# Patient Record
Sex: Male | Born: 1935 | Race: White | Hispanic: No | Marital: Married
Health system: Southern US, Community
[De-identification: ages and names within clinical notes are randomized; demographics above are authoritative.]

## PROBLEM LIST (undated history)

## (undated) DIAGNOSIS — H9190 Unspecified hearing loss, unspecified ear: Secondary | ICD-10-CM

## (undated) DIAGNOSIS — Z86718 Personal history of other venous thrombosis and embolism: Secondary | ICD-10-CM

## (undated) DIAGNOSIS — E7800 Pure hypercholesterolemia, unspecified: Secondary | ICD-10-CM

## (undated) DIAGNOSIS — I639 Cerebral infarction, unspecified: Secondary | ICD-10-CM

## (undated) DIAGNOSIS — I7121 Aneurysm of the ascending aorta, without rupture: Secondary | ICD-10-CM

## (undated) DIAGNOSIS — N183 Chronic kidney disease, stage 3 unspecified: Secondary | ICD-10-CM

## (undated) DIAGNOSIS — I712 Thoracic aortic aneurysm, without rupture: Secondary | ICD-10-CM

## (undated) DIAGNOSIS — F329 Major depressive disorder, single episode, unspecified: Secondary | ICD-10-CM

## (undated) DIAGNOSIS — I350 Nonrheumatic aortic (valve) stenosis: Secondary | ICD-10-CM

## (undated) DIAGNOSIS — N400 Enlarged prostate without lower urinary tract symptoms: Secondary | ICD-10-CM

## (undated) DIAGNOSIS — I82409 Acute embolism and thrombosis of unspecified deep veins of unspecified lower extremity: Secondary | ICD-10-CM

## (undated) DIAGNOSIS — I714 Abdominal aortic aneurysm, without rupture, unspecified: Secondary | ICD-10-CM

## (undated) DIAGNOSIS — R319 Hematuria, unspecified: Secondary | ICD-10-CM

## (undated) DIAGNOSIS — G459 Transient cerebral ischemic attack, unspecified: Secondary | ICD-10-CM

## (undated) DIAGNOSIS — F32A Depression, unspecified: Secondary | ICD-10-CM

## (undated) DIAGNOSIS — I4891 Unspecified atrial fibrillation: Secondary | ICD-10-CM

## (undated) DIAGNOSIS — I4821 Permanent atrial fibrillation: Secondary | ICD-10-CM

## (undated) DIAGNOSIS — G4733 Obstructive sleep apnea (adult) (pediatric): Secondary | ICD-10-CM

## (undated) DIAGNOSIS — R918 Other nonspecific abnormal finding of lung field: Secondary | ICD-10-CM

## (undated) DIAGNOSIS — E785 Hyperlipidemia, unspecified: Secondary | ICD-10-CM

## (undated) HISTORY — DX: Cerebral infarction, unspecified: I63.9

## (undated) HISTORY — DX: Major depressive disorder, single episode, unspecified: F32.9

## (undated) HISTORY — DX: Obstructive sleep apnea (adult) (pediatric): G47.33

## (undated) HISTORY — PX: PROSTATE SURGERY: SHX751

## (undated) HISTORY — DX: Nonrheumatic aortic (valve) stenosis: I35.0

## (undated) HISTORY — DX: Abdominal aortic aneurysm, without rupture, unspecified: I71.40

## (undated) HISTORY — DX: Hyperlipidemia, unspecified: E78.5

## (undated) HISTORY — DX: Abdominal aortic aneurysm, without rupture: I71.4

## (undated) HISTORY — DX: Transient cerebral ischemic attack, unspecified: G45.9

## (undated) HISTORY — DX: Pure hypercholesterolemia, unspecified: E78.00

## (undated) HISTORY — DX: Depression, unspecified: F32.A

---
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
## 1999-09-23 ENCOUNTER — Encounter: Payer: Self-pay | Admitting: Urology

## 1999-09-23 ENCOUNTER — Encounter: Admission: RE | Admit: 1999-09-23 | Discharge: 1999-09-23 | Payer: Self-pay | Admitting: Urology

## 2001-03-21 ENCOUNTER — Ambulatory Visit (HOSPITAL_COMMUNITY): Admission: RE | Admit: 2001-03-21 | Discharge: 2001-03-21 | Payer: Self-pay | Admitting: Endocrinology

## 2001-03-21 ENCOUNTER — Encounter: Payer: Self-pay | Admitting: Endocrinology

## 2002-06-30 ENCOUNTER — Emergency Department (HOSPITAL_COMMUNITY): Admission: EM | Admit: 2002-06-30 | Discharge: 2002-06-30 | Payer: Self-pay | Admitting: Emergency Medicine

## 2002-07-05 ENCOUNTER — Emergency Department (HOSPITAL_COMMUNITY): Admission: EM | Admit: 2002-07-05 | Discharge: 2002-07-05 | Payer: Self-pay | Admitting: Emergency Medicine

## 2002-07-05 ENCOUNTER — Encounter: Payer: Self-pay | Admitting: Emergency Medicine

## 2002-08-24 HISTORY — PX: NM MYOCAR PERF WALL MOTION: HXRAD629

## 2002-10-08 ENCOUNTER — Encounter: Payer: Self-pay | Admitting: Urology

## 2002-10-08 ENCOUNTER — Inpatient Hospital Stay (HOSPITAL_COMMUNITY): Admission: RE | Admit: 2002-10-08 | Discharge: 2002-10-11 | Payer: Self-pay | Admitting: Urology

## 2002-10-09 ENCOUNTER — Encounter (INDEPENDENT_AMBULATORY_CARE_PROVIDER_SITE_OTHER): Payer: Self-pay | Admitting: Specialist

## 2003-01-27 ENCOUNTER — Encounter: Payer: Self-pay | Admitting: *Deleted

## 2003-01-27 ENCOUNTER — Emergency Department (HOSPITAL_COMMUNITY): Admission: EM | Admit: 2003-01-27 | Discharge: 2003-01-27 | Payer: Self-pay | Admitting: Emergency Medicine

## 2003-01-29 ENCOUNTER — Emergency Department (HOSPITAL_COMMUNITY): Admission: EM | Admit: 2003-01-29 | Discharge: 2003-01-29 | Payer: Self-pay | Admitting: *Deleted

## 2003-02-06 ENCOUNTER — Emergency Department (HOSPITAL_COMMUNITY): Admission: EM | Admit: 2003-02-06 | Discharge: 2003-02-06 | Payer: Self-pay | Admitting: Emergency Medicine

## 2004-10-05 DIAGNOSIS — I82409 Acute embolism and thrombosis of unspecified deep veins of unspecified lower extremity: Secondary | ICD-10-CM

## 2004-10-05 HISTORY — DX: Acute embolism and thrombosis of unspecified deep veins of unspecified lower extremity: I82.409

## 2004-12-15 ENCOUNTER — Encounter (INDEPENDENT_AMBULATORY_CARE_PROVIDER_SITE_OTHER): Payer: Self-pay | Admitting: *Deleted

## 2004-12-15 ENCOUNTER — Inpatient Hospital Stay (HOSPITAL_COMMUNITY): Admission: RE | Admit: 2004-12-15 | Discharge: 2004-12-24 | Payer: Self-pay | Admitting: Urology

## 2004-12-17 ENCOUNTER — Encounter (INDEPENDENT_AMBULATORY_CARE_PROVIDER_SITE_OTHER): Payer: Self-pay | Admitting: Cardiology

## 2004-12-25 ENCOUNTER — Inpatient Hospital Stay (HOSPITAL_COMMUNITY): Admission: EM | Admit: 2004-12-25 | Discharge: 2004-12-27 | Payer: Self-pay | Admitting: Emergency Medicine

## 2005-10-17 ENCOUNTER — Encounter: Admission: RE | Admit: 2005-10-17 | Discharge: 2005-10-17 | Payer: Self-pay | Admitting: Neurological Surgery

## 2005-12-09 ENCOUNTER — Ambulatory Visit (HOSPITAL_COMMUNITY): Admission: RE | Admit: 2005-12-09 | Discharge: 2005-12-09 | Payer: Self-pay | Admitting: *Deleted

## 2005-12-09 ENCOUNTER — Encounter (INDEPENDENT_AMBULATORY_CARE_PROVIDER_SITE_OTHER): Payer: Self-pay | Admitting: Specialist

## 2007-09-13 ENCOUNTER — Encounter: Admission: RE | Admit: 2007-09-13 | Discharge: 2007-09-13 | Payer: Self-pay | Admitting: Cardiology

## 2008-02-08 ENCOUNTER — Encounter (INDEPENDENT_AMBULATORY_CARE_PROVIDER_SITE_OTHER): Payer: Self-pay | Admitting: *Deleted

## 2008-02-08 ENCOUNTER — Ambulatory Visit (HOSPITAL_COMMUNITY): Admission: RE | Admit: 2008-02-08 | Discharge: 2008-02-08 | Payer: Self-pay | Admitting: *Deleted

## 2011-02-17 NOTE — Op Note (Signed)
[name], Cesar [name]               ACCOUNT NO.:  [id number]   MEDICAL RECORD NO.:  [id number]          PATIENT TYPE:  AMB   LOCATION:  ENDO                         FACILITY:  [facility]   PHYSICIAN:  Georgiana Spinner, M.D.    DATE OF BIRTH:  [birth year]   DATE OF PROCEDURE:  DATE OF DISCHARGE:                               OPERATIVE REPORT   PROCEDURE:  Colonoscopy with polypectomy and biopsy.   INDICATIONS:  Colon polyps.   ANESTHESIA:  Fentanyl 50 mcg, Versed 5 mg.   DESCRIPTION OF PROCEDURE:  With the patient mildly sedated in the left  lateral decubitus position a rectal examination was performed, and I  felt there might be a prostate nodule on the right-hand side and the  prostate felt enlarged.  Subsequently, the Pentax videoscopic  colonoscope was inserted in the rectum and passed under direct vision  with pressure applied.  We reached the cecum, identified by the  ileocecal valve and appendiceal orifice.  There appeared to be a small  amount of bleeding in the cecum, possibly due to scope trauma from  suctioning only, which indicated to me that he may have been over  anticoagulated.  He had been on Lovenox in lieu of Coumadin.  In the  cecum was a polyp that I photographed and removed using snare cautery  technique, setting of 20/150 blended current.  A small amount of blood  oozed from this site despite a good coagulation.  Therefore, I elected  to inject 2 mL of epinephrine into this area and the bleeding stopped.  There were two other polyps in this area.  One was removed using hot  biopsy forceps technique, the second using snare cautery technique,  again with the same setting of 20/150 blended current.  There was good  hemostasis from these sites.  From this point, the colonoscope was  slowly withdrawn taking circumferential views of the colonic mucosa,  stopping next at 25 cm from the anal verge at which point another polyp  was seen, photographed and removed, again using  snare cautery technique  with the same setting.  And once again, there was a mild degree of  oozing from the biopsy site.  Two mL of epinephrine were injected and we  waited, but there was still some oozing, so I injected 1 mL on the other  side of the polypectomy site with good cessation of bleeding at that  point.  The endoscope was withdrawn to the rectum which appeared normal,  although the prostate could be seen indenting the rectum and showed  hemorrhoids on retroflexed view.  The endoscope was straightened and  withdrawn.  The patient's vital signs and pulse oximeter remained  stable.  The patient tolerated procedure well without apparent  complications.   FINDINGS:  Internal hemorrhoids, enlarged prostate with question of  nodule.  Polyps at 25 cm from the anal verge and in the cecum.   PLAN:  Await biopsy reports.  The patient will call me for results and  follow-up with me as an outpatient.  ______________________________  Georgiana Spinner, M.D.     GMO/MEDQ  D:  02/08/2008  T:  02/08/2008  Job:  [id number]

## 2011-02-20 NOTE — H&P (Signed)
NAMEBANYAN, GOODCHILD               ACCOUNT NO.:  [id number]   MEDICAL RECORD NO.:  [id number]          PATIENT TYPE:  INP   LOCATION:  0343                         FACILITY:  [facility]   PHYSICIAN:  Mallory Shirk, MD     DATE OF BIRTH:  Dec 27, 1928   DATE OF ADMISSION:  12/15/2004  DATE OF DISCHARGE:                                HISTORY & PHYSICAL   Audio too short to transcribe (less than 5 seconds)      GDK/MEDQ  D:  12/16/2004  T:  12/16/2004  Job:  [id number]

## 2011-02-20 NOTE — Op Note (Signed)
NAMECHRISTIANJAMES, SOULE               ACCOUNT NO.:  [id number]   MEDICAL RECORD NO.:  [id number]          PATIENT TYPE:  AMB   LOCATION:  ENDO                         FACILITY:  [facility]   PHYSICIAN:  Georgiana Spinner, M.D.    DATE OF BIRTH:  01-01-1929   DATE OF PROCEDURE:  12/09/2005  DATE OF DISCHARGE:                                 OPERATIVE REPORT   PROCEDURE:  Upper endoscopy.   ENDOSCOPIST:  Georgiana Spinner, M.D.   INDICATIONS:  Hemoccult positivity.   ANESTHESIA:  Demerol 50 and Versed 5 mg.   DESCRIPTION OF OPERATION:  With the patient mild sedated in the left lateral  decubitus position the Olympus video endoscope was inserted into mouth,  passed under direct vision, and through this the esophagus appeared normal  into the stomach. The fundus, body, antrum, duodenal bulb, and the second  portion of the duodenum were visualized.  From  this point the endoscope was  slowly withdrawn taking circumferential views of the duodenal mucosa, until  the endoscope had an pulled back into the stomach, placed in retroflexion to  view the stomach from below.   The endoscope was straightened and withdrawn, while taking circumferential  views of the remaining gastric and esophageal mucosae.  The patient's vital  signs plus _________ stable condition.   The patient tolerated the procedure well with no apparent complications.   FINDINGS:  This was an unremarkable examination.   PLAN:  Proceed to colonoscopy.           ______________________________  Georgiana Spinner, M.D.     GMO/MEDQ  D:  12/09/2005  T:  12/10/2005  Job:  [id number]

## 2011-02-20 NOTE — H&P (Signed)
NAMEKENDAL, RAFFO               ACCOUNT NO.:  [id number]   MEDICAL RECORD NO.:  [id number]          PATIENT TYPE:  INP   LOCATION:  0343                         FACILITY:  [facility]   PHYSICIAN:  Bertram Millard. Dahlstedt, M.D.DATE OF BIRTH:  1928-11-13   DATE OF ADMISSION:  12/15/2004  DATE OF DISCHARGE:  12/24/2004                                HISTORY & PHYSICAL   CHIEF COMPLAINT:  Benign prostatic hypertrophy.   HISTORY OF PRESENT ILLNESS:  Mr. Steinhoff is a nice 77 year old male who was  admitted for management of BPH. The patient had a TURP about 2 years ago. He  has had intermittent gross hematuria since that time. He is maintained on  Coumadin. A recent look in the patient's bladder for his persistent  hematuria revealed a fair number of stones lining the prostatic urethra,  also nodular re growth of this tissue.   As the patient has had persistent hematuria, in light of normal upper  tracts, it was recommended that he undergo repeat TURP to get rid of these  stones which are of fairly decent size. He presents at this time for that  procedure.   PAST MEDICAL HISTORY:  1.  Significant for benign prostatic hypertrophy. He is status post TURP in      January, 2000.  2.  History of atrial fibrillation which is rate controlled.  3.  History of rheumatic fever as a child, and left atrial enlargement.   MEDICATIONS ON ADMISSION:  1.  Digoxin 2.5 mg daily.  2.  Simvastatin 40 mg daily.  3.  He stopped his Coumadin recently.   ALLERGIES:  FLOMAX - causes upset stomach.   FAMILY HISTORY:  Significant for CVA.   SOCIAL HISTORY:  The patient is married and lives with his wife. He does not  use tobacco. He rarely drinks. He is retired.   PHYSICAL EXAMINATION:  GENERAL:  Exam revealed a pleasant, healthy appearing  elderly male.  VITAL SIGNS: Normal.  HEART:  Irregular rate, normal rhythm.  LUNGS:  Clear.  ABDOMEN:  Unremarkable.  GU:  Phallus normal without lesions, no fibrotic  areas or plaques. Glans  normal, meatus normal location and size without blood or discharge. Scrotal  skin normal. Testicles normal.  RECTAL:  Normal anal sphincter tone. Gland 3+, symmetrical, non nodular and  nontender. No rectal masses.  Seminal vesicles non palpable.   ADMISSION DATA:  Hematocrit 43.5%, white count 5900, platelet count 187,000.  PT and PTT were normal.  B-MET was normal except for a glucose of 110. EKG revealed atrial  fibrillation, rate 81, nonspecific ST, T wave abnormality, probable  Digitalis effect.   IMPRESSION:  1.  Hematuria.  2.  Benign prostatic hypertrophy.  3.  Prostatic calculi.   PLAN:  Admitted for transurethral resection of the prostate.      SMD/MEDQ  D:  01/18/2005  T:  01/18/2005  Job:  [id number]

## 2011-02-20 NOTE — Discharge Summary (Signed)
NAME:  Cesar [name], Cesar [name]                         ACCOUNT NO.:  [id number]   MEDICAL RECORD NO.:  [id number]                   PATIENT TYPE:  INP   LOCATION:  0374                                 FACILITY[facility]  [facility]   PHYSICIAN:  Bertram Millard. Dahlstedt, M.D.          DATE OF BIRTH:  09-13-1929   DATE OF ADMISSION:  DATE OF DISCHARGE:  10/11/2002                                 DISCHARGE SUMMARY   PRINCIPAL DIAGNOSIS:  Benign prostatic hyperplasia with retention.   SECONDARY DIAGNOSIS:  Atrial fibrillation, chronic.   PROCEDURE:  Transurethral resection of prostate on October 09, 2002.   HISTORY OF PRESENT ILLNESS:  The patient is a 77 year old male with chronic  atrial fibrillation, on Coumadin. He has been treated on several occasions  for urinary retention in my office. He has been on maximum medical therapy.  Due to the patient's recurrent retention and complications related to this,  I have recommended that we proceed with definitive surgical procedure. He is  admitted at this time for transurethral resection of prostate. The patient  has been weaning off his Coumadin and was to be admitted the day before for  heparin therapy.   HOSPITAL COURSE:  The patient was admitted to the hospital on October 08, 2002, and placed on IV heparin. This was  discontinued early on the morning  of his surgery which was October 09, 2002. He underwent transurethral  resection of prostate. He tolerated this procedure well.   His postoperative course was unremarkable except for a small amount of clot  retention the night after surgery. I came in the morning after surgery and  easily irrigated a small clot, which then alleviated this retention. The  overhead irrigation was left on for 24 hours. He was then hooked to  dependent drainage, and then on postoperative morning #2 he had his catheter  removed.   He voided well and his urine was fairly clear. At that time he was felt  suitable for discharge.  Pathologic review of his specimen revealed benign  prostatic tissue with foci of inflammation.   DISCHARGE MEDICATIONS:  The patient was discharged home on his regular  medications, to start Coumadin the night of discharge. He was  also sent  home on Keflex 250 mg 1 p.o. q.12h., #15.    FOLLOW UP:  He will follow up with me in approximately 2 weeks.   DISCHARGE INSTRUCTIONS:  He was given discharge instructions. He was  discharged on a regular diet.                                                Bertram Millard. Retta Diones, M.D.    SMD/MEDQ  D:  10/11/2002  T:  10/11/2002  Job:  [id number]   cc:   Brooke Bonito, M.D.  [address]  [place]  [place] [address]  Fax: [phone]   Nicki Guadalajara, M.D.  [id number]  [place], [place] [address]  Fax: [phone]

## 2011-02-20 NOTE — Op Note (Signed)
[name], Cesar [name]               ACCOUNT NO.:  [id number]   MEDICAL RECORD NO.:  [id number]          PATIENT TYPE:  AMB   LOCATION:  ENDO                         FACILITY:  [facility]   PHYSICIAN:  Georgiana Spinner, M.D.    DATE OF BIRTH:  [birth year]   DATE OF PROCEDURE:  12/09/2005  DATE OF DISCHARGE:                                 OPERATIVE REPORT   PROCEDURE:  Colonoscopy.   INDICATIONS:  Hemoccult positivity.   ANESTHESIA:  None further given.   PROCEDURE:  With the patient mildly sedated in the left lateral decubitus  position, a rectal examination was performed; I felt that there was a  nodularity to the prostate, especially on the right lateral aspect of it.  Subsequently, the Olympus videoscopic colonoscope was inserted into the  rectum and passed under direct vision to the cecum, identified by ileocecal  valve and crow's foot of the cecum. From this point, the colonoscope was  slowly withdrawn; taking circumferential views of colonic mucosa, stopping  only at approximately 20 cm from the anal verge.  At this point a small  polyp was seen, photographed and I elected at this point to remove it using  snare cautery technique (setting of 20/200 blended current).  The pull-  through was slightly quick, and there was a small amount of oozing of blood  from this site. Therefore, I elected to use the biopsy forceps to cauterize  that.  The endoscope was pulled back to the rectum, which appeared normal in  direct and retroflexed view.  The endoscope was straightened and withdrawn.  The patient's vital signs, pulse oximetry remained stable. The patient  tolerated the procedure well without apparent complication, other than minor  amount of bleeding.   PLAN:  Await biopsy report. The patient will call me for results and follow-  up with me as an outpatient.           ______________________________  Georgiana Spinner, M.D.     GMO/MEDQ  D:  12/09/2005  T:  12/10/2005  Job:  [id number]

## 2011-02-20 NOTE — Consult Note (Signed)
NAME:  Cesar [name], Cesar [name] NO.:  [id number]   MEDICAL RECORD NO.:  [id number]          PATIENT TYPE:  INP   LOCATION:  0343                         FACILITY:  [facility]   PHYSICIAN:  Mallory Shirk, MD     DATE OF BIRTH:  05-20-1929   DATE OF CONSULTATION:  DATE OF DISCHARGE:                                   CONSULTATION   CONSULTING PHYSICIAN:  Mallory Shirk, M.D.   CHIEF COMPLAINT:  Transient numbness in the left face, weakness of the left  hand, questionable confusion which resolved in 4-5 hours.   The patient admitted by Dr. Retta Diones for TURP.  Medicine consult requested  for above chief complaint.   HISTORY OF PRESENT ILLNESS:  [name] is a 77 year old Caucasian gentleman  who was admitted by Dr. Retta Diones, on December 15, 2004, for a TURP.  The  patient tolerated the TURP well and was brought back to the floor this a.m.  This a.m. his wife noticed that he had gone to the bathroom and was a little  confused.  Subsequently when he was trying to shave, he dropped his razor  but he kept shaving his face as if he had a razor in his hand.  The  patient's wife also noticed a left sided facial droop.  When the patient was  trying to eat his lunch, some of the food was dropping for the left side of  his face.  Medicine consult was requested to re-evaluate the patient's above  symptoms.  The patient has a history of chronic atrial fibrillation,  diagnosed about 20 years ago.  The patient has been on Coumadin which was  stopped a week prior to this TURP.  At the time of this exam, the patient  has no symptoms and states that he is back to baseline, except he is afraid  to get up now because of the episode that occurred when he walked himself to  the bathroom.  At the time of exam, the patient has no complaints.  Even  though the patient's wife thinks that there is a left sided facial droop  which is new, the patient denies that he feels that his face looks any  different.   PAST MEDICAL HISTORY:  1.  BPH, status post TURP on December 15, 2004.  The patient had a TURP in      January 2004 as well.  2.  Atrial fibrillation, rate controlled.  3.  Rheumatic fever in childhood.  4.  Left atrial enlargement.   MEDICATIONS:  At the present time:  1.  Ciprofloxacin 500 mg p.o. b.i.d.  2.  Digoxin 0.25 mg p.o. daily.  3.  Simvastatin 40 mg p.o. every day.  4.  Hydrocodone/APAP 1-2 tablets p.o. q.4h. p.r.n.  5.  Opium/belladonna alkaloid suppository q.6h. p.r.n.  6.  Zolpidem 10 mg p.o. q.h.s. p.r.n.   ALLERGIES:  FLOMAX unknown reaction.   FAMILY HISTORY:  Significant for CVA in father.   SOCIAL HISTORY:  The patient is married.  He lives with his wife.  No  smoking.  Occasional alcohol use.  REVIEW OF SYSTEMS:  Greater than ten systems reviewed.  Other than HPI, the  patient has no symptoms at the present time.   PHYSICAL EXAMINATION:  VITAL SIGNS:  Blood pressure 124/74, pulse 88,  respirations 18, temperature 99.0, sat is 97% on room air.  GENERAL:  Elderly, Caucasian gentleman lying in bed in no acute distress.  Alert and oriented  x 3.  Able to follow simple commands.  HEENT:  Normocephalic, atraumatic.  PERRL.  Sclerae anicteric.  Mucous  membranes moist.  NECK:  Supple.  No LAD.  No JVD.  Oropharynx non-erythematous.  LUNGS:  Clear to auscultation bilaterally.  No wheezes, no rales.  CARDIOVASCULAR:  S1 plus S2, irregularly irregular.  No murmurs, rubs or  gallops.  ABDOMEN:  Soft.  Positive bowel sounds.  No tenderness.  No masses.  EXTREMITIES:  No cyanosis, clubbing, or edema.  NEUROLOGIC:  Cranial nerves II-XII grossly intact.  Sensory/motor within  normal limits.  Babinski downgoing bilaterally.  DTRs 2+ and symmetrical all  four extremities.  Finger-to-nose-finger within normal limits.  Gait not  tested.  No focal neurologic deficits appreciated at this exam.   LABS:  PT 13.3, INR 1.0, PTT 29.   ASSESSMENT/PLAN:  A  77 year old Caucasian gentleman with a history of benign  prostatic hypertrophy, atrial fibrillation rate - controlled, admitted for  transurethral prostatectomy, status post transurethral prostatectomy, on  December 15, 2004, with some left sided facial numbness, left sided upper  extremity weakness, confusion which has resolved at the time of this exam.   1.  Transient ischemic attack.  The patient's symptoms are very difficult to      assess, exactly when they started first noticed by wife when she came      into the hospital.  There are some differences between what the patient      says and what his wife says, she saw.  Regardless, we will work this up      as a transient ischemic attack.  __________  We will check a TSH,      fasting lipid profile, CT of the head, carotid Doppler studies, MRI of      the brain and a 2D echocardiogram.  The patient has a family history of      cerebrovascular accident.  The family is concerned that this may be a      cerebrovascular accident.  The symptoms could be secondary to his pain      medications as well as his opiate/belladonna suppositories.  We will      check orthostatic blood pressures as well and give him intravenous      fluids if he is orthostatic.  2.  Transurethral prostatectomy.  The patient had some mild bleeding after      the procedure.  After discussion with Dr.  Retta Diones, urology, we will      start the patient on aspirin 325 mg p.o. every day.  We will wait 2-3      days for anticoagulation.  We will start that when okay by urology.  3.  Hyperlipidemia.  The patient's Zocor has been continued.  We will check      a lipid profile.  4.  Atrial fibrillation - rate controlled.  The patient will be put on a      monitor after this event of transient ischemic attack.  At the present      time, the patient is on no medications for rate control other than  digoxin.  We will answer also check a digoxin level.   DISPOSITION:  PT OT  consult has been requested for evaluating the patient's  strength and ADLs.  After this evaluation is complete and the results of the  test described above have been evaluated, the patient will be discharged as  determined by Dr. Retta Diones.   Thank you so much for this consultation.  [facility] will be  following the patient.  If you have any questions, please call [phone].      GDK/MEDQ  D:  12/16/2004  T:  12/16/2004  Job:  [id number]   cc:   Bertram Millard. Dahlstedt, M.D.  [address]  [place]  [place] [address]  Fax: [phone]   Adela Lank, Dr.

## 2011-02-20 NOTE — Discharge Summary (Signed)
[name], Cesar               ACCOUNT NO.:  [id number]   MEDICAL RECORD NO.:  [id number]          PATIENT TYPE:  INP   LOCATION:  0343                         [facility]ACILITY[facility]   PHYSICIAN:  Jonna L. Robb Matar, M.D.DATE OF BIRTH:  06/14/1929   DATE OF ADMISSION:  12/15/2004  DATE OF DISCHARGE:  12/24/2004                                 DISCHARGE SUMMARY   PRIMARY CARE PHYSICIAN:  Brooke Bonito, M.D.   FINAL DIAGNOSIS:  1.  Benign prostatic hypertrophy.  2.  Transurethral resection of the prostate done on December 15, 2004.  3.  Right frontoparietal cortical cerebrovascular accident.  4.  Atrial fibrillation.  5.  Urinary tract infection.   HISTORY:  This is 77 year old male who postoperatively from TURP developed  mild confusion, left-sided facial droop, mild left hemiparesis. The patient  has had rate controlled atrial fibrillation and had been on digoxin and  Coumadin. His Coumadin was thought to be prior to TURP.   Physical exam at the time of her consultation was noted for an irregular  heart rate. By the time he was seen in consultation the confusion and left-  sided weakness had resolved.   HOSPITAL COURSE:  Workup for this included a normal carotid Doppler study  just showing mild plaque throughout with no stenosis. MRI showed an acute  right posterior frontal cortical infarct, sphenoid sinusitis, and some mild  atrophy and small vessel disease. CT was unremarkable. Echocardiography done  on March 15th showed a normal ejection fraction of 60%, mild aortic valve  regurgitation, mild mitral valve regurgitation, and marked dilatation of the  left atrium. The patient was put on a heparin drip and his Coumadin was  resumed. He also developed transient fever on December 22, 2004. Urine was  cultured and the patient was started on gentamycin and Keflex. Culture is  pending at the time of this dictation.   DISPOSITION:  The patient will be discharged back to home. He should  resume  his Coumadin 7.5 mg alternating with 5 mg, digoxin 0.25 mg daily, Zocor 40  daily, aspirin 325 mg daily, Nasonex for his sinusitis. He should continue  on an antibiotic to be determined in the next 48 hours by his culture  reports. He should follow up with Dr. Juleen China within four or five days for a  repeat INR.      JLB/MEDQ  D:  12/23/2004  T:  12/23/2004  Job:  [id number]

## 2011-02-20 NOTE — Discharge Summary (Signed)
NAMEMADDON, HORTON               ACCOUNT NO.:  [id number]   MEDICAL RECORD NO.:  [id number]          PATIENT TYPE:  INP   LOCATION:  0357                         FACILITY:  [facility]   PHYSICIAN:  Bertram Millard. Dahlstedt, M.D.DATE OF BIRTH:  [birth year]   DATE OF ADMISSION:  12/25/2004  DATE OF DISCHARGE:  12/27/2004                                 DISCHARGE SUMMARY   PRIMARY DIAGNOSIS:  Lower extremity deep venous thrombosis.   OTHER DIAGNOSES:  1.  History of atrial fibrillation.  2.  Benign prostatic hypertrophy with hematuria.   BRIEF HISTORY:  This nice 77 year old male was discharged from the hospital  shortly before this admission after he underwent a TURP.  The patient was  discharged home on his regular Coumadin.  He called the answering service on  the day of admission with swelling of his lower extremity.  He was brought  in for evaluation and found to have a DVT.  He is admitted at this time for  heparinization.   PAST MEDICAL HISTORY:  1.  Chronic atrial fibrillation, requiring Coumadin therapy and digoxin.  2.  BPH status post TURP in 2004 and in March of this year.  3.  History of rheumatic fever as a child.  4.  History of recent cortical CVA based on an MRI done on March 14.   For recent H&P, please see the admission note.   HOSPITAL COURSE:  The patient was admitted to the urology service and had  consultation by [facility] physician/hospitalist.  It was recommended that he  continue Coumadin, and that he would be put on heparin for at least 24  hours.  He had no significant symptomatology during his hospital course.  His heparin and Coumadin were followed with PT and PTT.  He had minimal  clots with voiding and was able to void adequately.  On March 24, his INR  was 2.3.  He was felt to be able to go home on the 25th.  At that time, his  INR was 2.3 as well.  He will continue his Coumadin.  The patient was  encouraged to ambulate.  His fever, which was most likely  due to his DVT,  resolved.  He was discharged on his regular home medications.  He will  follow up with Dr. Celedonio Savage stent approximately 1 week.  Coumadin was dosed at 5  mg every other day and alternating 7.5 mg.   DISCHARGE MEDICATIONS:  1.  Digoxin 0.25 mg once daily.  2.  Zocor 40 mg daily.  3.  Vicodin p.r.n.   He was discharged in improved condition.      SMD/MEDQ  D:  02/12/2005  T:  02/12/2005  Job:  [id number]   cc:   Brooke Bonito, M.D.  [address]  [place]  [place] [address]  Fax: [phone]

## 2011-02-20 NOTE — Consult Note (Signed)
Cesar [name], [name] NO.:  [id number]   MEDICAL RECORD NO.:  [id number]          PATIENT TYPE:  INP   LOCATION:  0357                         FACILITY:  [facility]   PHYSICIAN:  Hettie Holstein, D.O.    DATE OF BIRTH:  [birth year]   DATE OF CONSULTATION:  12/25/2004  DATE OF DISCHARGE:                                   CONSULTATION   PRIMARY CARE PHYSICIAN:  Dr. Adela Lank   REFERRING PHYSICIAN:  Dr. Brunilda Payor   REASON FOR CONSULTATION:  DVT.   HISTORY OF PRESENT ILLNESS:  This pleasant 77 year old Caucasian male just  discharged yesterday from [facility] after  hospitalization for transurethral prostatectomy performed on December 15, 2004,  for prostatomegaly.  Course at the time involved a transient episode of  numbness in his face and weakness of his left hand that resolved at the  time.  In any event, he was discharged home on the 22nd.  He does have a  prior history of chronic atrial fibrillation and is chronically on Coumadin  for many years.  Prior to his transurethral prostatectomy, he was instructed  to withhold his Coumadin, and he did and resumed this following.  His INR  prior to discharge was 1.6.  In any event, that hospital course was also  complicated by finding of a cerebrovascular cortical infarct, and there was  no residual reported.  In any event, he went home that evening, took a hot  shower, and developed pain and swelling in his right leg, subsequently  presented to the emergency department with fever as well as pyuria.  He was  admitted to urology service, started on empiric IV antibiotics and cultured  and also underwent lower extremity Dopplers which today revealed positive  DVT.  Therefore, we were asked to assist in his management.  He did have an  INR of 2.1 this morning; however, his previous INR was 1.6 just prior to  discharge.   HISTORY OF PRESENT ILLNESS:  As noted above, chronic atrial fibrillation on  chronic  Coumadin therapy and digoxin.  Transurethral prostatectomy on March  13 and January 2004.  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  He did have a 2 D echocardiogram  performed as previously mentioned revealing normal LV function, though there  was a markedly dilated left atrium.   HOME MEDICATIONS:  1.  Coumadin 7.5 mg alternating with 5 mg.  2.  Digoxin 0.25 mg daily.  3.  Zocor 40 mg daily.  4.  Aspirin 325 mg daily.  5.  Nasonex.   CURRENT MEDICATIONS IN-HOSPITAL:  Ancef, aspirin, gentamicin, digoxin,  Zetia, Zocor, and now Coumadin.   ALLERGIES:  FLOMAX.   SOCIAL HISTORY:  The patient is married, lives with his wife, no smoking,  drinks only occasional alcohol.   FAMILY HISTORY:  Significant for cerebrovascular accident in his father.   REVIEW OF SYSTEMS:  Please see recent discharge.  The patient was just  discharged from  the hospital within the past 24 hours.   PHYSICAL EXAMINATION:  VITAL SIGNS:  Blood pressure 96/60, respirations 16,  heart rate 80s irregular, temperature 98.1.  GENERAL:  The patient is alert, in no acute distress.  CARDIOVASCULAR:  S1, S2 irregular.  PULMONARY:  Clear bilaterally.  ABDOMEN:  Soft.  EXTREMITIES:  Erythema and edema of the right lower extremity and  tenderness.  NEUROLOGIC:  Euthymic.  His affect is stable.   LABORATORY DATA:  Sodium 136, potassium 4.5, BUN 15, creatinine 1.5, glucose  121.  WBC 14.1, hemoglobin 11.6, platelet count 279, MCV 92, INR 2.1.  Urinalysis reveals too numerous to count WBCs.   ASSESSMENT:  1.  Acute right lower extremity deep venous thrombosis with recent      hospitalization and OR course, status post TURP with pyuria.  2.  Febrile illness secondary to the above.  3.  Recent acute cerebrovascular accident discovered on MRI approximately 1      week ago without  residual.  4.  Chronic atrial fibrillation on Coumadin and digoxin.   PLAN:  At this time, we are going to proceed with Coumadinization and  because he is at risk for clot progression, we will plan on heparin bridging  for the next 24 hours.  We will use heparin drip in light of his recent  infarct and follow his status closely.  We will continue to treat his pyuria  as per urology and plan on discharge home once his INR is therapeutic for 48  hours.      ESS/MEDQ  D:  12/25/2004  T:  12/25/2004  Job:  [id number]   cc:   Brooke Bonito, M.D.  [address]  [place]  [place] [address]  Fax: [phone]   Lindaann Slough, M.D.  [address]  [place]  [place] [address]  Fax: [phone]

## 2011-02-20 NOTE — Op Note (Signed)
[name], Cesar               ACCOUNT NO.:  [id number]   MEDICAL RECORD NO.:  [id number]          PATIENT TYPE:  INP   LOCATION:  0004                         FACILITY:  [facility]   PHYSICIAN:  Bertram Millard. Dahlstedt, M.D.DATE OF BIRTH:  Jun 06, 1929   DATE OF PROCEDURE:  12/15/2004  DATE OF DISCHARGE:                                 OPERATIVE REPORT   PREOPERATIVE DIAGNOSIS:  Benign prostatic hypertrophy, recurrent, with  bladder calculi.   POSTOPERATIVE DIAGNOSIS:  Benign prostatic hypertrophy, recurrent with  bladder calculi.   PROCEDURE:  TURP.   SURGEON:  Bertram Millard. Dahlstedt, M.D.   ANESTHESIA:  General with endotracheal.   COMPLICATIONS:  None.   INDICATIONS FOR PROCEDURE:  This elderly male is status post a TURP  approximately two years ago.  He has had recurrent gross hematuria.  Recent  evaluation revealed recurrent BPH, but he had some bladder calculi at the  bladder neck.  It was recommended that he have these bladder calculi removed  as well as re-resection.  He presents at this time for that procedure.  He  is aware of the risks and complications and desires to proceed.   DESCRIPTION OF PROCEDURE:  The patient was administered preoperative IV  antibiotics with ampicillin 2 g IV and taken to the operating room where  general anesthetic was administered.  He was placed in the dorsal lithotomy  position.  The genitalia and perineum were prepped and draped.   His urethra was calibrated to 30 [place] with [employer].  A 28 French  resectoscope sheath was placed.  The resectoscope element was then also  placed.  There were several calculi in the bladder on the mucosa of the  prostatic urethra.  There was obstructing tissue as well.  The prostatic  urethra was quite long, approximately 5 cm.  Resection was begun anteriorly  at the 12 o'clock position.  Anterior tissue was removed down to the  surgical capsule.  There was a significant amount of this tissue.   Attention was then turned to the left prostatic lobe.  Resection was taken  from anteriorly to posteriorly, using the verumontanum as a guide for distal  resection.  Resection was taken down just about to the surgical capsule.  During this resection, several small stones we dislodged.  Resection was  continued on the left lobe down to the apical tissue.  Again, the  verumontanum was used for limits of the resection.  The right prostatic lobe  was then also taken down.  A significant amount of this tissue remained,  especially in the apical area.  It was resected down to the surgical  capsule.  Sinus veins were not entered.  The bladder neck was actually  fairly capacious.  It opened up during the procedure, especially when the  anterior tissue was taken.  The apical tissue on the right was also  resected.  Small bleeders were electrocoagulated.  Hemostasis was excellent.  Chips had been evacuated throughout the case and were then again evacuated  at the end of the case.  Inspection of the bladder revealed no remaining  tissue.  Inspection  of the prostatic fossa revealed it to be quite capacious, and no active  bleeding was seen.  At this point, the resectoscope was removed, and the 22  [place] Foley was placed and hooked to overhead drainage.   The patient tolerated the procedure well.  He was awakened and taken to the  PACU in stable condition.      SMD/MEDQ  D:  12/15/2004  T:  12/15/2004  Job:  [id number]

## 2011-02-20 NOTE — Op Note (Signed)
NAME:  Cesar [name], Cesar [name]                         ACCOUNT NO.:  [id number]   MEDICAL RECORD NO.:  [id number]                   PATIENT TYPE:  INP   LOCATION:  0374                                 FACILITY:  [facility]   PHYSICIAN:  Bertram Millard. Dahlstedt, M.D.          DATE OF BIRTH:  [birth year]   DATE OF PROCEDURE:  10/09/2002  DATE OF DISCHARGE:                                 OPERATIVE REPORT   PREOPERATIVE DIAGNOSIS:  Benign prostatic hypertrophy with recurrent  retention.   POSTOPERATIVE DIAGNOSIS:  Benign prostatic hypertrophy with recurrent  retention.   OPERATION PERFORMED:  Transurethral resection of the prostate.   SURGEON:  Bertram Millard. Dahlstedt, M.D.   ANESTHESIA:  General endotracheal.   COMPLICATIONS:  None.   INDICATIONS FOR PROCEDURE:  The patient is a 77 year old male with atrial  fibrillation and recurrent retention due to benign prostatic hypertrophy.  The patient has been treated with catheterization several times in my  office.  He is on maximum medical therapy.  Due to the patient having  recurrent retention and the fact that this will more than likely repeat  itself down the road, it was recommended that the patient undergo some sort  of procedure to treat his prostate enlargement.  The patient has a fairly  large median lobe, and I feel that it is more than likely necessary to  perform a TURP rather than an in office TUNA procedure.  The risks and  complications of this procedure had been discussed at length with the  patient who desires to proceed.  The patient is on Coumadin for his atrial  fibrillation and was admitted the day before for heparin therapy.   DESCRIPTION OF PROCEDURE:  The patient was taken to the operating room after  IV antibiotic prophylaxis was administered in the holding area.  He was  administered a general endotracheal anesthetic and placed in the dorsal  lithotomy position.  Genitalia and perineum were prepped and draped.  His  urethral meatus was calibrated to 30 [place] with [employer] sounds.  The  resectoscope sheath was placed in the patient's bladder and the bladder  inspected.  It was found to be normal.  The ureteral orifices were well away  from the median lobe of the prostate.  The prostate was significantly  obstructed.  The median lobe was first taken down from the bladder neck to  the veru montanum.  At this point the right and left lateral lobes of the  prostate were then taken down to the surgical capsules.  Anterior tissue was  significant and was also resected.  All resection was down to the surgical  capsule.  Apical tissue was then carefully resected.  Small bleeders were  identified and cauterized.  Additionally, mucosa at the resection margin was  cauterized.  Following this, adequate hemostasis was achieved.  The chips  were irrigated from the bladder.  At this point the bladder was drained  and  the scope removed.  A 22 French 3-way Foley catheter with a 30 cc balloon  was placed.  It was hooked to dependent drainage with overhead irrigation.   The patient tolerated the procedure well.  He was awakened and taken to PACU  in stable condition.                                                Bertram Millard. Retta Diones, M.D.    SMD/MEDQ  D:  10/09/2002  T:  10/09/2002  Job:  [id number]   cc:   Brooke Bonito, M.D.  [address]  [place]  [place] [address]  Fax: [phone]   Nicki Guadalajara, M.D.  [address]  [place], [place] [address]  Fax: [phone]

## 2011-02-20 NOTE — H&P (Signed)
   NAME:  Cesar [name], Cesar [name]                         ACCOUNT NO.:  [id number]   MEDICAL RECORD NO.:  [id number]                   PATIENT TYPE:  INP   LOCATION:  0374                                 FACILITY:  [facility]   PHYSICIAN:  Bertram Millard. Dahlstedt, M.D.          DATE OF BIRTH:  [birth year]   DATE OF ADMISSION:  10/08/2002  DATE OF DISCHARGE:  10/11/2002                                HISTORY & PHYSICAL   REASON FOR ADMISSION:  Large prostate.   BRIEF HISTORY:  This elderly gentleman is admitted at this time for TURP.  He has significant symptoms of prostatism and has had several episodes of  urinary retention regarding catheterization.  The patient has had to use  intermittent catheterization as well.  After failing maximal medical  therapy, it is felt at this time that we should proceed with operative  management. Alternatives to TURP have been discussed at length with the  patient.  He desires to proceed.   PAST MEDICAL HISTORY:  1. Atrial fibrillation.  2. Rheumatic fever as a child.  3. Left atrial enlargement.   MEDICATIONS:  1. Lanoxin 0.25 mg a day.  2. Coumadin 5 mg on Monday, Tuesday, Wednesday, Friday, and Sunday; 1-1/2     tablets on Tuesday and Thursday.  3. Ecotrin 81 mg a day.  4. Fish oil.  5. Lipitor.   ALLERGIES:  He has no significant drug reactions.   SOCIAL HISTORY:  The patient is married.  He still works in a [employer].  He does not use tobacco.  He drinks occasionally.   REVIEW OF SYSTEMS:  Unremarkable.   FAMILY HISTORY:  Unremarkable.   PHYSICAL EXAMINATION:  GENERAL:  Pleasant, healthy-appearing adult male.  HEENT:  Normal.  NECK:  Supple without thyromegaly or adenopathy.  CHEST: Clear bilaterally.  HEART:  Irregularly irregular rhythm, normal rate.  ABDOMEN:  Unremarkable.  EXTERNAL GENITALIA:  Unremarkable.  RECTAL:  Normal male sphincter tone, 3+ gland without tenderness or  nodularity.  No rectal masses.  Seminal vesicles not  palpable.  EXTREMITIES:  Without cyanosis, clubbing, or edema.  NEUROLOGIC:  Grossly intact.   LABORATORY DATA:  CBC was normal except for hematocrit of 38.7%.  BMET was  normal.  TSH was 12.6.  Urinalysis revealed small leukocyte esterase.   EKG revealed atrial fibrillation with normal rate.   Chest x-ray clear.   IMPRESSION:  1. Benign prostatic hypertrophy.  2. Atrial fibrillation, heart rate controlled.  3. History of retention, resolved.   PLAN:  TURP.                                               Bertram Millard. Dahlstedt, M.D.    SMD/MEDQ  D:  11/16/2002  T:  11/16/2002  Job:  [id number]

## 2011-06-13 ENCOUNTER — Emergency Department (HOSPITAL_BASED_OUTPATIENT_CLINIC_OR_DEPARTMENT_OTHER)
Admission: EM | Admit: 2011-06-13 | Discharge: 2011-06-13 | Disposition: A | Discharge status: Home / Self Care | Payer: Medicare Other | Attending: Emergency Medicine | Admitting: Emergency Medicine

## 2011-06-13 ENCOUNTER — Encounter: Payer: Self-pay | Admitting: Emergency Medicine

## 2011-06-13 DIAGNOSIS — M109 Gout, unspecified: Secondary | ICD-10-CM | POA: Insufficient documentation

## 2011-06-13 DIAGNOSIS — M25579 Pain in unspecified ankle and joints of unspecified foot: Secondary | ICD-10-CM | POA: Insufficient documentation

## 2011-06-13 HISTORY — DX: Unspecified atrial fibrillation: I48.91

## 2011-06-13 MED ORDER — PREDNISONE 10 MG PO TABS: ORAL_TABLET | ORAL | Status: DC

## 2011-06-13 MED ORDER — IBUPROFEN 400 MG PO TABS: 400.0000 mg | ORAL_TABLET | Freq: Four times a day (QID) | ORAL | Status: AC | PRN

## 2011-06-13 MED ORDER — PREDNISONE 20 MG PO TABS
40.0000 mg | ORAL_TABLET | Freq: Once | ORAL | Status: AC
  Administered 2011-06-13: 40 mg via ORAL
  Filled 2011-06-13: qty 2

## 2011-06-13 MED ORDER — IBUPROFEN 400 MG PO TABS
400.0000 mg | ORAL_TABLET | Freq: Once | ORAL | Status: AC
  Administered 2011-06-13: 400 mg via ORAL
  Filled 2011-06-13: qty 1

## 2011-06-13 NOTE — ED Notes (Signed)
R Foot raised red with medial aspect tender to touch

## 2011-06-13 NOTE — ED Provider Notes (Signed)
History   81yM with R foot pain. Gradual onset a couple days ago. Denies trauma. Hx of gout and feels similar. No fever or chills. No n/v. No numbness, tingling or loss of strength.  CSN: [id number] Arrival date & time: 06/13/2011  9:07 AM  Chief Complaint  Patient presents with  . Foot Pain    Foot pain and redness x4 days pulses intact denies trauma   HPI  Past Medical History  Diagnosis Date  . Atrial fibrillation     History reviewed. No pertinent past surgical history.  History reviewed. No pertinent family history.  History  Substance Use Topics  . Smoking status: Never Smoker   . Smokeless tobacco: Not on file  . Alcohol Use: No      Review of Systems  Constitutional: Negative for fever and chills.  HENT: Negative.   Respiratory: Negative.   Cardiovascular: Negative.   Genitourinary: Negative.   Musculoskeletal:       R distal foot pain, swelling and redness.   Neurological: Negative.   Hematological: Negative.   Psychiatric/Behavioral: Negative.   All other systems reviewed and are negative.    Physical Exam  BP 117/68  Pulse 72  Temp(Src) 98.8 F (37.1 C) (Oral)  Resp 18  Ht 6\' 1"  (1.854 m)  Wt 212 lb (96.163 kg)  BMI 27.97 kg/m2  SpO2 99%  Physical Exam  Constitutional: He appears well-developed and well-nourished. No distress.       Hard of hearing  HENT:  Head: Normocephalic and atraumatic.  Eyes: Conjunctivae are normal. Right eye exhibits no discharge. Left eye exhibits no discharge.  Cardiovascular: Normal rate and normal heart sounds.   Pulmonary/Chest: Effort normal and breath sounds normal. No respiratory distress.  Musculoskeletal:       Tenderness and marked pan with ROM 1st toe R foot with slight extension proximally. Mild erythema and increased warmth. Skin intact. No drainage. Sensation intact. Good cap refill.  Neurological: He is alert.  Skin: Skin is warm and dry.  Psychiatric: He has a normal mood and affect.    ED Course   Procedures  MDM 81yM with pain/erythema R foot in area of 1st MP joint. Suspect gout given location and hx of same. No hx of acute trauma.  Also consider septic arthritis but clinical suspicion low. No evidence of vascular compromise. Will tx with course of NSAIDs and steroids. Given duration of symptoms do not feel colchicine will be of much benefit at this time.     Raeford Razor, MD 06/29/11 1058

## 2011-06-13 NOTE — ED Notes (Signed)
Pt verbalizes plan well and follow up with PMD

## 2011-07-01 LAB — APTT: aPTT: 32

## 2011-08-09 ENCOUNTER — Emergency Department (HOSPITAL_BASED_OUTPATIENT_CLINIC_OR_DEPARTMENT_OTHER)
Admission: EM | Admit: 2011-08-09 | Discharge: 2011-08-09 | Disposition: A | Discharge status: Home / Self Care | Payer: Medicare Other | Attending: Emergency Medicine | Admitting: Emergency Medicine

## 2011-08-09 ENCOUNTER — Encounter (HOSPITAL_BASED_OUTPATIENT_CLINIC_OR_DEPARTMENT_OTHER): Payer: Self-pay

## 2011-08-09 DIAGNOSIS — Y92009 Unspecified place in unspecified non-institutional (private) residence as the place of occurrence of the external cause: Secondary | ICD-10-CM | POA: Insufficient documentation

## 2011-08-09 DIAGNOSIS — S0502XA Injury of conjunctiva and corneal abrasion without foreign body, left eye, initial encounter: Secondary | ICD-10-CM

## 2011-08-09 DIAGNOSIS — IMO0002 Reserved for concepts with insufficient information to code with codable children: Secondary | ICD-10-CM | POA: Insufficient documentation

## 2011-08-09 DIAGNOSIS — S058X9A Other injuries of unspecified eye and orbit, initial encounter: Secondary | ICD-10-CM | POA: Insufficient documentation

## 2011-08-09 DIAGNOSIS — I4891 Unspecified atrial fibrillation: Secondary | ICD-10-CM | POA: Insufficient documentation

## 2011-08-09 HISTORY — DX: Benign prostatic hyperplasia without lower urinary tract symptoms: N40.0

## 2011-08-09 MED ORDER — TETRACAINE HCL 0.5 % OP SOLN
1.0000 [drp] | Freq: Once | OPHTHALMIC | Status: AC
  Administered 2011-08-09: 2 [drp] via OPHTHALMIC

## 2011-08-09 MED ORDER — FLUORESCEIN SODIUM 1 MG OP STRP
1.0000 | ORAL_STRIP | Freq: Once | OPHTHALMIC | Status: AC
  Administered 2011-08-09: 1 via OPHTHALMIC

## 2011-08-09 MED ORDER — FLUORESCEIN SODIUM 1 MG OP STRP
ORAL_STRIP | OPHTHALMIC | Status: AC
  Administered 2011-08-09: 1 via OPHTHALMIC
  Filled 2011-08-09: qty 1

## 2011-08-09 MED ORDER — ERYTHROMYCIN 5 MG/GM OP OINT: TOPICAL_OINTMENT | OPHTHALMIC | Status: AC

## 2011-08-09 MED ORDER — HYDROCODONE-ACETAMINOPHEN 5-325 MG PO TABS: 1.0000 | ORAL_TABLET | ORAL | Status: AC | PRN

## 2011-08-09 MED ORDER — TETRACAINE HCL 0.5 % OP SOLN
OPHTHALMIC | Status: AC
  Administered 2011-08-09: 2 [drp] via OPHTHALMIC
  Filled 2011-08-09: qty 2

## 2011-08-09 NOTE — ED Notes (Addendum)
Pt states that last night when getting ready for bed he felt like there was a foreign object in his Left eye, pt irrigated eye with water, and did not notice anything come out.  Pt states that pain persists and that he feels as if there is something in the outer corner of the left eye.  Conjunctiva is red, eye watering.  No obvious injury noted.

## 2011-08-12 NOTE — ED Provider Notes (Signed)
History     CSN: [id number] Arrival date & time: 08/09/2011  4:30 AM   First MD Initiated Contact with Patient 08/09/11 [phone]      Chief Complaint  Patient presents with  . Eye Pain     HPI  82yoM pw concern for FB left eye. States that he noted irritation as he was getting ready for bed tonight. Has been rubbing. Flushed with water, no relief. Denies change in vision. States that earlier today he was watching his son weld "but I was so far away with my head turned that nothing got in my eye". Denies fever, chills. Denies pain with light, eye movements. No other complaints today. He is not a contact lens wearer.    Maryruth Hancock, RN 08/09/2011 04:42     Pt states that last night when getting ready for bed he felt like there was a foreign object in his Left eye, pt irrigated eye with water, and did not notice anything come out. Pt states that pain persists and that he feels as if there is something in the outer corner of the left eye. Conjunctiva is red, eye watering. No obvious injury noted.     Past Medical History  Diagnosis Date  . Atrial fibrillation   . Prostate enlargement     Past Surgical History  Procedure Date  . Prostate surgery     History reviewed. No pertinent family history.  History  Substance Use Topics  . Smoking status: Never Smoker   . Smokeless tobacco: Not on file  . Alcohol Use: No    Review of Systems  All other systems reviewed and are negative.  except as noted HPI  Allergies  Flomax  Home Medications   Current Outpatient Rx  Name Route Sig Dispense Refill  . CITALOPRAM HYDROBROMIDE 10 MG PO TABS Oral Take 10 mg by mouth daily.      Marland Kitchen DIGOXIN 0.125 MG PO TABS Oral Take 125 mcg by mouth daily.      Marland Kitchen FINASTERIDE 1 MG PO TABS Oral Take 1 mg by mouth daily.      Marland Kitchen ROSUVASTATIN CALCIUM 10 MG PO TABS Oral Take 10 mg by mouth daily.      . WARFARIN SODIUM 5 MG PO TABS Oral Take 5 mg by mouth daily. Placebo Trail study for Warfarin 5.0 mg  MW   7.5mg  tue Magdalene Molly     . ERYTHROMYCIN 5 MG/GM OP OINT  Place a 1/2 inch ribbon of ointment into the lower eyelid 4 times daily x5 days 1 g 0  . HYDROCODONE-ACETAMINOPHEN 5-325 MG PO TABS Oral Take 1 tablet by mouth every 4 (four) hours as needed for pain. 10 tablet 0  . PREDNISONE 10 MG PO TABS  4 tabs (40mg  )PO daily for 2 days, then 2 tabs 20(mgs) for 4 days, then 1 tab (10mg ) for 3 days then stop. 19 tablet 0    BP 169/94  Pulse 61  Temp(Src) 97.8 F (36.6 C) (Oral)  Resp 17  Ht 6\' 1"  (1.854 m)  Wt 214 lb (97.07 kg)  BMI 28.23 kg/m2  SpO2 95%  Physical Exam  Nursing note and vitals reviewed. Constitutional: He is oriented to person, place, and time. He appears well-developed and well-nourished. No distress.  HENT:  Head: Atraumatic.  Mouth/Throat: Oropharynx is clear and moist.  Eyes: EOM are normal. Pupils are equal, round, and reactive to light.       L conjunctiva lateral to lateral limbus injected Pain relieved  with topical analgesic Lateral limbus to small abrasion visualized under UV light  Slit lamp exam reveals no obvious FB, no rust ring   Neck: Neck supple.  Cardiovascular: Normal rate, regular rhythm, normal heart sounds and intact distal pulses.  Exam reveals no gallop and no friction rub.   No murmur heard. Pulmonary/Chest: Effort normal. No respiratory distress. He has no wheezes. He has no rales.  Abdominal: Soft. Bowel sounds are normal. There is no tenderness. There is no rebound and no guarding.  Musculoskeletal: Normal range of motion. He exhibits no edema and no tenderness.  Neurological: He is alert and oriented to person, place, and time.  Skin: Skin is warm and dry.  Psychiatric: He has a normal mood and affect.    ED Course  Procedures (including critical care time)  Labs Reviewed - No data to display No results found.   1. Corneal abrasion, left     MDM  Corneal abrasion. Less likely burn. Visual acuity intact. Will cover with abx  ointment and PO analgesics. Ophthalmology follow up   Stefano Gaul, MD        Forbes Cellar, MD 08/12/11 [phone]

## 2011-12-25 DIAGNOSIS — I499 Cardiac arrhythmia, unspecified: Secondary | ICD-10-CM | POA: Diagnosis not present

## 2011-12-25 DIAGNOSIS — I4891 Unspecified atrial fibrillation: Secondary | ICD-10-CM | POA: Diagnosis not present

## 2011-12-25 DIAGNOSIS — N400 Enlarged prostate without lower urinary tract symptoms: Secondary | ICD-10-CM | POA: Diagnosis not present

## 2011-12-25 DIAGNOSIS — I6789 Other cerebrovascular disease: Secondary | ICD-10-CM | POA: Diagnosis not present

## 2011-12-30 DIAGNOSIS — R011 Cardiac murmur, unspecified: Secondary | ICD-10-CM | POA: Diagnosis not present

## 2011-12-30 DIAGNOSIS — R0602 Shortness of breath: Secondary | ICD-10-CM | POA: Diagnosis not present

## 2011-12-30 DIAGNOSIS — I4891 Unspecified atrial fibrillation: Secondary | ICD-10-CM | POA: Diagnosis not present

## 2011-12-30 DIAGNOSIS — E782 Mixed hyperlipidemia: Secondary | ICD-10-CM | POA: Diagnosis not present

## 2012-01-01 ENCOUNTER — Emergency Department (HOSPITAL_COMMUNITY): Payer: Medicare Other

## 2012-01-01 ENCOUNTER — Other Ambulatory Visit: Payer: Self-pay

## 2012-01-01 ENCOUNTER — Encounter (HOSPITAL_COMMUNITY): Payer: Self-pay

## 2012-01-01 ENCOUNTER — Emergency Department (HOSPITAL_COMMUNITY)
Admission: EM | Admit: 2012-01-01 | Discharge: 2012-01-01 | Disposition: A | Discharge status: Home / Self Care | Payer: Medicare Other | Attending: Emergency Medicine | Admitting: Emergency Medicine

## 2012-01-01 DIAGNOSIS — R001 Bradycardia, unspecified: Secondary | ICD-10-CM

## 2012-01-01 DIAGNOSIS — R42 Dizziness and giddiness: Secondary | ICD-10-CM | POA: Insufficient documentation

## 2012-01-01 DIAGNOSIS — R404 Transient alteration of awareness: Secondary | ICD-10-CM | POA: Diagnosis not present

## 2012-01-01 DIAGNOSIS — I499 Cardiac arrhythmia, unspecified: Secondary | ICD-10-CM | POA: Diagnosis not present

## 2012-01-01 DIAGNOSIS — Z7901 Long term (current) use of anticoagulants: Secondary | ICD-10-CM | POA: Insufficient documentation

## 2012-01-01 DIAGNOSIS — I69998 Other sequelae following unspecified cerebrovascular disease: Secondary | ICD-10-CM | POA: Diagnosis not present

## 2012-01-01 DIAGNOSIS — I4891 Unspecified atrial fibrillation: Secondary | ICD-10-CM | POA: Insufficient documentation

## 2012-01-01 DIAGNOSIS — I498 Other specified cardiac arrhythmias: Secondary | ICD-10-CM | POA: Diagnosis not present

## 2012-01-01 DIAGNOSIS — R55 Syncope and collapse: Secondary | ICD-10-CM | POA: Insufficient documentation

## 2012-01-01 LAB — GLUCOSE, CAPILLARY: Glucose-Capillary: 81 mg/dL (ref 70–99)

## 2012-01-01 LAB — CBC
MCH: 32.7 pg (ref 26.0–34.0)
MCV: 94 fL (ref 78.0–100.0)
Platelets: 140 10*3/uL — ABNORMAL LOW (ref 150–400)
RBC: 4.47 MIL/uL (ref 4.22–5.81)

## 2012-01-01 LAB — URINALYSIS, ROUTINE W REFLEX MICROSCOPIC
Glucose, UA: NEGATIVE mg/dL
Ketones, ur: NEGATIVE mg/dL
pH: 5.5 (ref 5.0–8.0)

## 2012-01-01 LAB — BASIC METABOLIC PANEL
BUN: 20 mg/dL (ref 6–23)
Calcium: 8.7 mg/dL (ref 8.4–10.5)
GFR calc non Af Amer: 51 mL/min — ABNORMAL LOW (ref >90)
Glucose, Bld: 112 mg/dL — ABNORMAL HIGH (ref 70–99)
Sodium: 138 mEq/L (ref 135–145)

## 2012-01-01 LAB — DIFFERENTIAL
Eosinophils Absolute: 0.3 10*3/uL (ref 0.0–0.7)
Eosinophils Relative: 4 % (ref 0–5)
Lymphs Abs: 0.8 10*3/uL (ref 0.7–4.0)
Monocytes Relative: 12 % (ref 3–12)

## 2012-01-01 LAB — URINE MICROSCOPIC-ADD ON

## 2012-01-01 NOTE — ED Notes (Signed)
Pt ambulated to bathroom with family member. Pt denies any issues of dizziness

## 2012-01-01 NOTE — ED Notes (Signed)
EMS reports pt was walking to mailbox when he became dizzy and fell.  Pt has bruise to right shoulder.  Pt denies LOC and hitting head.  Pt states he is waiting for a holter monitor

## 2012-01-01 NOTE — ED Notes (Signed)
Bag lunch and low fat milk given to pt.  Tolerated well

## 2012-01-01 NOTE — ED Notes (Signed)
Pt discharged home. Had no further questions. Wheelchair to discharge.  

## 2012-01-01 NOTE — Discharge Instructions (Signed)
It is very important to followup with cardiology on Wednesday for further evaluation and management of your low heart rate as well as for this near syncopal (fainting) event. However return to emergency Department any time for changing or worsening symptoms.  Holter Monitoring A Holter monitor is a small device with electrodes (small sticky patches) that attach to your chest. It records the electrical activity of your heart and is worn continuously for 24-48 hours.  A HOLTER MONITOR IS USED TO  Detect heart problems such as:   Heart arrhythmia. Is an abnormal or irregular heartbeat. With some heart arrhythmias, you may not feel or know that you have an irregular heart rhythm.   Palpitations, such as feeling your heart racing or fluttering. It is possible to have heart palpitations and not have a heart arrhythmia.   A heart rhythm that is too slow or too fast.   If you have problems fainting, near fainting or feeling light-headed, a Holter monitor may be worn to see if your heart is the cause.  HOLTER MONITOR PREPARATION   Electrodes will be attached to the skin on your chest.   If you have hair on your chest, small areas may have to be shaved. This is done to help the patches stick better and make the recording more accurate.   The electrodes are attached by wires to the Holter monitor. The Holter monitor clips to your clothing. You will wear the monitor at all times, even while exercising and sleeping.  HOME CARE INSTRUCTIONS   Wear your monitor at all times.   The wires and the monitor must stay dry. Do not get the monitor wet.   Do not bathe, swim or use a hot tub with it on.   You may do a "sponge" bath while you have the monitor on.   Keep your skin clean, do not put body lotion or moisturizer on your chest.   It's possible that your skin under the electrodes could become irritated. To keep this from happening, you may put the electrodes in slightly different places on your  chest.   Your caregiver will also ask you to keep a diary of your activities, such as walking or doing chores. Be sure to note what you are doing if you experience heart symptoms such as palpitations. This will help your caregiver determine what might be contributing to your symptoms. The information stored in your monitor will be reviewed by your caregiver alongside your diary entries.   Make sure the monitor is safely clipped to your clothing or in a location close to your body that your caregiver recommends.   The monitor and electrodes are removed when the test is over. Return the monitor as directed.   Be sure to follow up with your caregiver and discuss your Holter monitor results.  SEEK IMMEDIATE MEDICAL CARE IF:  You faint or feel lightheaded.   You have trouble breathing.   You get pain in your chest, upper arm or jaw.   You feel sick to your stomach and your skin is pale, cool, or damp.   You think something is wrong with the way your heart is beating.  MAKE SURE YOU:   Understand these instructions.   Will watch your condition.   Will get help right away if you are not doing well or get worse.  Document Released: 06/19/2004 Document Revised: 09/10/2011 Document Reviewed: 11/01/2008 [facility] Patient Information 2012 [place], [place].

## 2012-01-01 NOTE — ED Notes (Signed)
Pt ate 100% of lunch

## 2012-01-01 NOTE — ED Notes (Signed)
Pt ambulated to restroom with no episode of dizziness

## 2012-01-01 NOTE — ED Provider Notes (Signed)
History     CSN: [id number]  Arrival date & time 01/01/12  1012   First MD Initiated Contact with Patient 01/01/12 1014      Chief Complaint  Patient presents with  . Near Syncope    (Consider location/radiation/quality/duration/timing/severity/associated sxs/prior treatment) HPI  Patient presents to emergency department by EMS with his son and his wife at bedside with complaint of syncope versus presyncopal event. Patient states that he was recently taken off of his digoxin and by his cardiologist, Dr. Salena Saner., with [facility] due to having low heart rate. Patient states that after stopping the digoxin last week he rescheduled him close followup for an echocardiogram and a Holter monitor or for Wednesday of this coming week, 6 days from now. Patient states that he was walking down to his mailbox just [place] which is approximately an 8th of a mile from his house and when he got to the mailbox he just "fell right over." Patient denies loss of consciousness stating "I remember catching myself on my knees and my hands but when I tried to stand I fell back over and I tried to stand and I fell back over again like I couldn't get up." Patient denies any associated chest pain. He felt mildly short of breath with walking. He denies any associated headache. He denies spinning sensation. He states "I must have felt dizzy or I wouldn't have fallen." Patient states he has no complaints lying in bed comfortably. Symptoms were acute onset, and resolved. Patient denies injury from fall. He denies hitting his head, neck or back pain.  Past Medical History  Diagnosis Date  . Atrial fibrillation   . Prostate enlargement     Past Surgical History  Procedure Date  . Prostate surgery     No family history on file.  History  Substance Use Topics  . Smoking status: Never Smoker   . Smokeless tobacco: Not on file  . Alcohol Use: No      Review of Systems  All other systems reviewed and  are negative.    Allergies  Flomax  Home Medications   Current Outpatient Rx  Name Route Sig Dispense Refill  . CITALOPRAM HYDROBROMIDE 10 MG PO TABS Oral Take 10 mg by mouth daily.      Marland Kitchen FINASTERIDE 5 MG PO TABS Oral Take 5 mg by mouth daily.    Marland Kitchen ROSUVASTATIN CALCIUM 10 MG PO TABS Oral Take 10 mg by mouth daily.      . WARFARIN SODIUM 5 MG PO TABS Oral Take 5 mg by mouth See admin instructions. Take only on tues and thurs at night    . WARFARIN SODIUM 7.5 MG PO TABS Oral Take 7.5 mg by mouth See admin instructions. Take only on mon, wed, fri, sat, and Sunday at night    . DIGOXIN 0.125 MG PO TABS Oral Take 125 mcg by mouth daily.        BP 120/74  Pulse 78  Temp(Src) 97.4 F (36.3 C) (Oral)  Resp 20  SpO2 96%  Physical Exam  Nursing note and vitals reviewed. Constitutional: He is oriented to person, place, and time. He appears well-developed and well-nourished. No distress.  HENT:  Head: Normocephalic and atraumatic.  Eyes: Conjunctivae and EOM are normal. Pupils are equal, round, and reactive to light.       No nystagmus, no vertical or horizontal  Neck: Normal range of motion. Neck supple.       No carotid bruit.  Cardiovascular: Normal rate, regular rhythm, normal heart sounds and intact distal pulses.  Exam reveals no gallop and no friction rub.   No murmur heard. Pulmonary/Chest: Effort normal and breath sounds normal. No respiratory distress. He has no wheezes. He has no rales. He exhibits no tenderness.  Abdominal: Soft. Bowel sounds are normal. He exhibits no distension and no mass. There is no tenderness. There is no rebound and no guarding.  Musculoskeletal: Normal range of motion. He exhibits no edema and no tenderness.  Neurological: He is alert and oriented to person, place, and time. He has normal reflexes. No cranial nerve deficit. Coordination normal.  Skin: Skin is warm and dry. No rash noted. He is not diaphoretic. No erythema.  Psychiatric: He has a  normal mood and affect.    ED Course  Procedures (including critical care time)  Evaluation, management plan and imaging discussed with Dr. Hyman Hopes   Date: 01/01/2012  Rate: 73  Rhythm: atrial fibrillation and premature ventricular contractions (PVC)  QRS Axis: normal  Intervals: normal  ST/T Wave abnormalities: ST depressions laterally  Conduction Disutrbances:none  Narrative Interpretation: no STEMI  Old EKG Reviewed: ST depression laterally, mild assicated with PVC's   Date: 01/01/2012  Rate: 69  Rhythm: atrial fibrillation and premature ventricular contractions (PVC)  QRS Axis: normal  Intervals: normal  ST/T Wave abnormalities: nonspecific ST/T changes  Conduction Disutrbances:none  Narrative Interpretation:   Old EKG Reviewed: improved depression compared to earlier in day   2:17 PM Patient has been up ambulating without difficulty or ataxia.   Labs Reviewed  CBC - Abnormal; Notable for the following:    Platelets 140 (*)    All other components within normal limits  BASIC METABOLIC PANEL - Abnormal; Notable for the following:    Glucose, Bld 112 (*)    GFR calc non Af Amer 51 (*)    GFR calc Af Amer 59 (*)    All other components within normal limits  PROTIME-INR - Abnormal; Notable for the following:    Prothrombin Time 23.6 (*)    INR 2.06 (*)    All other components within normal limits  DIFFERENTIAL  POCT I-STAT TROPONIN I  URINALYSIS, ROUTINE W REFLEX MICROSCOPIC   Ct Head Wo Contrast  01/01/2012  *RADIOLOGY REPORT*  Clinical Data: Dizziness.  Near-syncope.  History of atrial fibrillation.  CT HEAD WITHOUT CONTRAST  Technique:  Contiguous axial images were obtained from the base of the skull through the vertex without contrast.  Comparison: 12/16/2004 CT and MR.  Findings: No intracranial hemorrhage.  Interval development of left frontal lobe infarct of indeterminate age.  Interval development of remote appearing infarct anterior aspect of the anterior limb  of the left internal capsule/ lenticular nucleus.  Small vessel disease type changes.  No intracranial mass lesion detected on this unenhanced exam.  Global atrophy without hydrocephalus.  Vascular calcifications.  Minimal mucosal thickening right sphenoid sinus.  Partial opacification ethmoid sinus air cells bilaterally.  IMPRESSION: No intracranial hemorrhage.  Interval development of left frontal lobe infarct of indeterminate age.  Interval development of remote appearing infarct anterior aspect of the anterior limb of the left internal capsule/ lenticular nucleus.  Small vessel disease type changes.  Original Report Authenticated By: Fuller Canada, M.D.   Mr Brain Wo Contrast  01/01/2012  *RADIOLOGY REPORT*  Clinical Data: Dizziness.  Fall this morning.  MRI HEAD WITHOUT CONTRAST  Technique:  Multiplanar, multiecho pulse sequences of the brain and surrounding structures were obtained according to  standard protocol without intravenous contrast.  Comparison: 01/01/2012 head CT.  12/16/2004 brain MR.  Findings: No acute infarct.  The left frontal lobe infarct appears remote (although new compared to 2006).  Remote infarct with encephalomalacia anterior aspect of the anterior limb of the left internal capsule and anterior aspect of the left lenticular nucleus.  Moderate small vessel disease type changes.  Global atrophy without hydrocephalus.  No intracranial hemorrhage.  No intracranial mass lesion noted on this unenhanced exam.  Major intracranial vascular structures are patent.  Paranasal sinus mucosal thickening most notable involving the ethmoid sinus air cells.  Mild transverse ligament hypertrophy.  IMPRESSION: No acute infarct.  Please see above.  Original Report Authenticated By: Fuller Canada, M.D.     1. Near syncope   2. Bradycardia   3. Atrial fibrillation     I spoke at length with Dr. Herbie Baltimore about patient's presentation, ER evaluation, and EKGs. Dr. Herbie Baltimore evaluated EKGs and believes  that the questionable depression is secondary to digoxin use. He states that given 2 negative troponins and no complaints of chest pain that he feels like patient is stable from a cardiac standpoint for outpatient followup in 3 days for his Holter monitor and echocardiogram. Patient again denies any true loss of consciousness and any injury from the fall. No neuro focal findings and ambulating without difficulty. Patient strongly desires to be discharged home for close followup cardiology and primary care the voices understanding that he'll return to emergency department immediately for any changing or worsening symptoms.  MDM          Jenness Corner, PA 01/01/12 1551

## 2012-01-02 NOTE — ED Provider Notes (Signed)
Medical screening examination/treatment/procedure(s) were conducted as a shared visit with non-physician practitioner(s) and myself.  I personally evaluated the patient during the encounter  Well appearing. RRR, no mrg. CTAB. States he never experienced cp/sob/palpitations. Asx since arrival to ED. ED wu with slightly abnormal EKG- PVCS, lateral depressions which cardiology thinks is 2/2 digoxin use (d/c recently). Trop negative x 2. PA D/W Cardiology. He has appt next week and they are comfortable with f/u. Pt and family would like to go home.  Forbes Cellar, MD 01/02/12 [phone]

## 2012-01-06 DIAGNOSIS — R002 Palpitations: Secondary | ICD-10-CM | POA: Diagnosis not present

## 2012-01-06 DIAGNOSIS — R0602 Shortness of breath: Secondary | ICD-10-CM | POA: Diagnosis not present

## 2012-01-06 DIAGNOSIS — I4891 Unspecified atrial fibrillation: Secondary | ICD-10-CM | POA: Diagnosis not present

## 2012-01-06 HISTORY — PX: US ECHOCARDIOGRAPHY: HXRAD669

## 2012-01-28 DIAGNOSIS — N401 Enlarged prostate with lower urinary tract symptoms: Secondary | ICD-10-CM | POA: Diagnosis not present

## 2012-01-28 DIAGNOSIS — R310 Gross hematuria: Secondary | ICD-10-CM | POA: Diagnosis not present

## 2012-01-28 DIAGNOSIS — N281 Cyst of kidney, acquired: Secondary | ICD-10-CM | POA: Diagnosis not present

## 2012-01-28 DIAGNOSIS — R972 Elevated prostate specific antigen [PSA]: Secondary | ICD-10-CM | POA: Diagnosis not present

## 2012-02-02 DIAGNOSIS — I359 Nonrheumatic aortic valve disorder, unspecified: Secondary | ICD-10-CM | POA: Diagnosis not present

## 2012-02-09 DIAGNOSIS — Z7901 Long term (current) use of anticoagulants: Secondary | ICD-10-CM | POA: Diagnosis not present

## 2012-02-09 DIAGNOSIS — E789 Disorder of lipoprotein metabolism, unspecified: Secondary | ICD-10-CM | POA: Diagnosis not present

## 2012-02-09 DIAGNOSIS — Z79899 Other long term (current) drug therapy: Secondary | ICD-10-CM | POA: Diagnosis not present

## 2012-02-09 DIAGNOSIS — I4891 Unspecified atrial fibrillation: Secondary | ICD-10-CM | POA: Diagnosis not present

## 2012-02-09 DIAGNOSIS — I6990 Unspecified sequelae of unspecified cerebrovascular disease: Secondary | ICD-10-CM | POA: Diagnosis not present

## 2012-02-15 DIAGNOSIS — M2550 Pain in unspecified joint: Secondary | ICD-10-CM | POA: Diagnosis not present

## 2012-02-15 DIAGNOSIS — M79609 Pain in unspecified limb: Secondary | ICD-10-CM | POA: Diagnosis not present

## 2012-02-15 DIAGNOSIS — E789 Disorder of lipoprotein metabolism, unspecified: Secondary | ICD-10-CM | POA: Diagnosis not present

## 2012-03-10 DIAGNOSIS — I4891 Unspecified atrial fibrillation: Secondary | ICD-10-CM | POA: Diagnosis not present

## 2012-03-10 DIAGNOSIS — I6990 Unspecified sequelae of unspecified cerebrovascular disease: Secondary | ICD-10-CM | POA: Diagnosis not present

## 2012-03-10 DIAGNOSIS — Z7901 Long term (current) use of anticoagulants: Secondary | ICD-10-CM | POA: Diagnosis not present

## 2012-03-14 DIAGNOSIS — H259 Unspecified age-related cataract: Secondary | ICD-10-CM | POA: Diagnosis not present

## 2012-03-14 DIAGNOSIS — H02829 Cysts of unspecified eye, unspecified eyelid: Secondary | ICD-10-CM | POA: Diagnosis not present

## 2012-03-14 DIAGNOSIS — H31009 Unspecified chorioretinal scars, unspecified eye: Secondary | ICD-10-CM | POA: Diagnosis not present

## 2012-03-14 DIAGNOSIS — H43819 Vitreous degeneration, unspecified eye: Secondary | ICD-10-CM | POA: Diagnosis not present

## 2012-03-28 DIAGNOSIS — I6990 Unspecified sequelae of unspecified cerebrovascular disease: Secondary | ICD-10-CM | POA: Diagnosis not present

## 2012-03-28 DIAGNOSIS — I4891 Unspecified atrial fibrillation: Secondary | ICD-10-CM | POA: Diagnosis not present

## 2012-03-28 DIAGNOSIS — Z7901 Long term (current) use of anticoagulants: Secondary | ICD-10-CM | POA: Diagnosis not present

## 2012-04-28 DIAGNOSIS — I4891 Unspecified atrial fibrillation: Secondary | ICD-10-CM | POA: Diagnosis not present

## 2012-04-28 DIAGNOSIS — R05 Cough: Secondary | ICD-10-CM | POA: Diagnosis not present

## 2012-04-28 DIAGNOSIS — Z7901 Long term (current) use of anticoagulants: Secondary | ICD-10-CM | POA: Diagnosis not present

## 2012-04-28 DIAGNOSIS — I6990 Unspecified sequelae of unspecified cerebrovascular disease: Secondary | ICD-10-CM | POA: Diagnosis not present

## 2012-05-04 DIAGNOSIS — R0609 Other forms of dyspnea: Secondary | ICD-10-CM | POA: Diagnosis not present

## 2012-05-04 DIAGNOSIS — E789 Disorder of lipoprotein metabolism, unspecified: Secondary | ICD-10-CM | POA: Diagnosis not present

## 2012-05-04 DIAGNOSIS — R0989 Other specified symptoms and signs involving the circulatory and respiratory systems: Secondary | ICD-10-CM | POA: Diagnosis not present

## 2012-05-04 DIAGNOSIS — R05 Cough: Secondary | ICD-10-CM | POA: Diagnosis not present

## 2012-05-12 DIAGNOSIS — Z7901 Long term (current) use of anticoagulants: Secondary | ICD-10-CM | POA: Diagnosis not present

## 2012-05-12 DIAGNOSIS — I4891 Unspecified atrial fibrillation: Secondary | ICD-10-CM | POA: Diagnosis not present

## 2012-05-12 DIAGNOSIS — I6990 Unspecified sequelae of unspecified cerebrovascular disease: Secondary | ICD-10-CM | POA: Diagnosis not present

## 2012-05-26 DIAGNOSIS — I6990 Unspecified sequelae of unspecified cerebrovascular disease: Secondary | ICD-10-CM | POA: Diagnosis not present

## 2012-05-26 DIAGNOSIS — Z7901 Long term (current) use of anticoagulants: Secondary | ICD-10-CM | POA: Diagnosis not present

## 2012-05-26 DIAGNOSIS — I4891 Unspecified atrial fibrillation: Secondary | ICD-10-CM | POA: Diagnosis not present

## 2012-06-02 DIAGNOSIS — I4891 Unspecified atrial fibrillation: Secondary | ICD-10-CM | POA: Diagnosis not present

## 2012-06-27 DIAGNOSIS — I6990 Unspecified sequelae of unspecified cerebrovascular disease: Secondary | ICD-10-CM | POA: Diagnosis not present

## 2012-06-27 DIAGNOSIS — I4891 Unspecified atrial fibrillation: Secondary | ICD-10-CM | POA: Diagnosis not present

## 2012-06-27 DIAGNOSIS — Z7901 Long term (current) use of anticoagulants: Secondary | ICD-10-CM | POA: Diagnosis not present

## 2012-07-28 DIAGNOSIS — I4891 Unspecified atrial fibrillation: Secondary | ICD-10-CM | POA: Diagnosis not present

## 2012-07-28 DIAGNOSIS — I6990 Unspecified sequelae of unspecified cerebrovascular disease: Secondary | ICD-10-CM | POA: Diagnosis not present

## 2012-07-28 DIAGNOSIS — Z7901 Long term (current) use of anticoagulants: Secondary | ICD-10-CM | POA: Diagnosis not present

## 2012-08-10 DIAGNOSIS — E789 Disorder of lipoprotein metabolism, unspecified: Secondary | ICD-10-CM | POA: Diagnosis not present

## 2012-08-16 DIAGNOSIS — N400 Enlarged prostate without lower urinary tract symptoms: Secondary | ICD-10-CM | POA: Diagnosis not present

## 2012-08-16 DIAGNOSIS — E789 Disorder of lipoprotein metabolism, unspecified: Secondary | ICD-10-CM | POA: Diagnosis not present

## 2012-08-16 DIAGNOSIS — I4891 Unspecified atrial fibrillation: Secondary | ICD-10-CM | POA: Diagnosis not present

## 2012-08-29 DIAGNOSIS — Z7901 Long term (current) use of anticoagulants: Secondary | ICD-10-CM | POA: Diagnosis not present

## 2012-08-29 DIAGNOSIS — I4891 Unspecified atrial fibrillation: Secondary | ICD-10-CM | POA: Diagnosis not present

## 2012-08-29 DIAGNOSIS — I6990 Unspecified sequelae of unspecified cerebrovascular disease: Secondary | ICD-10-CM | POA: Diagnosis not present

## 2012-09-26 DIAGNOSIS — R05 Cough: Secondary | ICD-10-CM | POA: Diagnosis not present

## 2012-09-26 DIAGNOSIS — I6990 Unspecified sequelae of unspecified cerebrovascular disease: Secondary | ICD-10-CM | POA: Diagnosis not present

## 2012-09-26 DIAGNOSIS — I4891 Unspecified atrial fibrillation: Secondary | ICD-10-CM | POA: Diagnosis not present

## 2012-09-26 DIAGNOSIS — Z7901 Long term (current) use of anticoagulants: Secondary | ICD-10-CM | POA: Diagnosis not present

## 2012-10-27 DIAGNOSIS — I6990 Unspecified sequelae of unspecified cerebrovascular disease: Secondary | ICD-10-CM | POA: Diagnosis not present

## 2012-10-27 DIAGNOSIS — Z7901 Long term (current) use of anticoagulants: Secondary | ICD-10-CM | POA: Diagnosis not present

## 2012-10-27 DIAGNOSIS — I4891 Unspecified atrial fibrillation: Secondary | ICD-10-CM | POA: Diagnosis not present

## 2012-11-01 DIAGNOSIS — R05 Cough: Secondary | ICD-10-CM | POA: Diagnosis not present

## 2012-11-01 DIAGNOSIS — I4891 Unspecified atrial fibrillation: Secondary | ICD-10-CM | POA: Diagnosis not present

## 2012-11-01 DIAGNOSIS — E789 Disorder of lipoprotein metabolism, unspecified: Secondary | ICD-10-CM | POA: Diagnosis not present

## 2012-11-15 DIAGNOSIS — E789 Disorder of lipoprotein metabolism, unspecified: Secondary | ICD-10-CM | POA: Diagnosis not present

## 2012-11-21 DIAGNOSIS — R0989 Other specified symptoms and signs involving the circulatory and respiratory systems: Secondary | ICD-10-CM | POA: Diagnosis not present

## 2012-11-21 DIAGNOSIS — E789 Disorder of lipoprotein metabolism, unspecified: Secondary | ICD-10-CM | POA: Diagnosis not present

## 2012-11-21 DIAGNOSIS — N400 Enlarged prostate without lower urinary tract symptoms: Secondary | ICD-10-CM | POA: Diagnosis not present

## 2012-12-27 DIAGNOSIS — I6990 Unspecified sequelae of unspecified cerebrovascular disease: Secondary | ICD-10-CM | POA: Diagnosis not present

## 2012-12-27 DIAGNOSIS — I4891 Unspecified atrial fibrillation: Secondary | ICD-10-CM | POA: Diagnosis not present

## 2012-12-27 DIAGNOSIS — Z7901 Long term (current) use of anticoagulants: Secondary | ICD-10-CM | POA: Diagnosis not present

## 2013-01-10 DIAGNOSIS — I6990 Unspecified sequelae of unspecified cerebrovascular disease: Secondary | ICD-10-CM | POA: Diagnosis not present

## 2013-01-10 DIAGNOSIS — I4891 Unspecified atrial fibrillation: Secondary | ICD-10-CM | POA: Diagnosis not present

## 2013-01-10 DIAGNOSIS — Z7901 Long term (current) use of anticoagulants: Secondary | ICD-10-CM | POA: Diagnosis not present

## 2013-01-23 DIAGNOSIS — I6990 Unspecified sequelae of unspecified cerebrovascular disease: Secondary | ICD-10-CM | POA: Diagnosis not present

## 2013-01-23 DIAGNOSIS — Z7901 Long term (current) use of anticoagulants: Secondary | ICD-10-CM | POA: Diagnosis not present

## 2013-01-23 DIAGNOSIS — I4891 Unspecified atrial fibrillation: Secondary | ICD-10-CM | POA: Diagnosis not present

## 2013-02-01 DIAGNOSIS — N281 Cyst of kidney, acquired: Secondary | ICD-10-CM | POA: Diagnosis not present

## 2013-02-01 DIAGNOSIS — N400 Enlarged prostate without lower urinary tract symptoms: Secondary | ICD-10-CM | POA: Diagnosis not present

## 2013-02-01 DIAGNOSIS — R972 Elevated prostate specific antigen [PSA]: Secondary | ICD-10-CM | POA: Diagnosis not present

## 2013-02-01 DIAGNOSIS — R310 Gross hematuria: Secondary | ICD-10-CM | POA: Diagnosis not present

## 2013-02-06 DIAGNOSIS — Z125 Encounter for screening for malignant neoplasm of prostate: Secondary | ICD-10-CM | POA: Diagnosis not present

## 2013-02-06 DIAGNOSIS — I6990 Unspecified sequelae of unspecified cerebrovascular disease: Secondary | ICD-10-CM | POA: Diagnosis not present

## 2013-02-06 DIAGNOSIS — I4891 Unspecified atrial fibrillation: Secondary | ICD-10-CM | POA: Diagnosis not present

## 2013-02-06 DIAGNOSIS — E789 Disorder of lipoprotein metabolism, unspecified: Secondary | ICD-10-CM | POA: Diagnosis not present

## 2013-02-06 DIAGNOSIS — Z79899 Other long term (current) drug therapy: Secondary | ICD-10-CM | POA: Diagnosis not present

## 2013-02-06 DIAGNOSIS — Z7901 Long term (current) use of anticoagulants: Secondary | ICD-10-CM | POA: Diagnosis not present

## 2013-02-16 DIAGNOSIS — J449 Chronic obstructive pulmonary disease, unspecified: Secondary | ICD-10-CM | POA: Diagnosis not present

## 2013-02-16 DIAGNOSIS — E789 Disorder of lipoprotein metabolism, unspecified: Secondary | ICD-10-CM | POA: Diagnosis not present

## 2013-02-16 DIAGNOSIS — Z79899 Other long term (current) drug therapy: Secondary | ICD-10-CM | POA: Diagnosis not present

## 2013-02-16 DIAGNOSIS — N400 Enlarged prostate without lower urinary tract symptoms: Secondary | ICD-10-CM | POA: Diagnosis not present

## 2013-02-16 DIAGNOSIS — R0609 Other forms of dyspnea: Secondary | ICD-10-CM | POA: Diagnosis not present

## 2013-03-13 DIAGNOSIS — I6990 Unspecified sequelae of unspecified cerebrovascular disease: Secondary | ICD-10-CM | POA: Diagnosis not present

## 2013-03-13 DIAGNOSIS — Z7901 Long term (current) use of anticoagulants: Secondary | ICD-10-CM | POA: Diagnosis not present

## 2013-03-13 DIAGNOSIS — I4891 Unspecified atrial fibrillation: Secondary | ICD-10-CM | POA: Diagnosis not present

## 2013-04-10 DIAGNOSIS — Z7901 Long term (current) use of anticoagulants: Secondary | ICD-10-CM | POA: Diagnosis not present

## 2013-04-10 DIAGNOSIS — I4891 Unspecified atrial fibrillation: Secondary | ICD-10-CM | POA: Diagnosis not present

## 2013-04-10 DIAGNOSIS — I6990 Unspecified sequelae of unspecified cerebrovascular disease: Secondary | ICD-10-CM | POA: Diagnosis not present

## 2013-05-01 DIAGNOSIS — H524 Presbyopia: Secondary | ICD-10-CM | POA: Diagnosis not present

## 2013-05-01 DIAGNOSIS — H259 Unspecified age-related cataract: Secondary | ICD-10-CM | POA: Diagnosis not present

## 2013-05-01 DIAGNOSIS — H5210 Myopia, unspecified eye: Secondary | ICD-10-CM | POA: Diagnosis not present

## 2013-05-01 DIAGNOSIS — H40059 Ocular hypertension, unspecified eye: Secondary | ICD-10-CM | POA: Diagnosis not present

## 2013-05-05 ENCOUNTER — Encounter: Payer: Self-pay | Admitting: *Deleted

## 2013-05-08 DIAGNOSIS — Z7901 Long term (current) use of anticoagulants: Secondary | ICD-10-CM | POA: Diagnosis not present

## 2013-05-08 DIAGNOSIS — I6990 Unspecified sequelae of unspecified cerebrovascular disease: Secondary | ICD-10-CM | POA: Diagnosis not present

## 2013-05-08 DIAGNOSIS — I4891 Unspecified atrial fibrillation: Secondary | ICD-10-CM | POA: Diagnosis not present

## 2013-05-11 ENCOUNTER — Encounter: Payer: Self-pay | Admitting: Cardiovascular Disease

## 2013-05-11 ENCOUNTER — Ambulatory Visit (INDEPENDENT_AMBULATORY_CARE_PROVIDER_SITE_OTHER): Payer: Medicare Other | Admitting: Cardiovascular Disease

## 2013-05-11 VITALS — BP 128/80 | HR 73 | Ht 73.0 in | Wt 217.1 lb

## 2013-05-11 DIAGNOSIS — I951 Orthostatic hypotension: Secondary | ICD-10-CM | POA: Diagnosis not present

## 2013-05-11 DIAGNOSIS — I359 Nonrheumatic aortic valve disorder, unspecified: Secondary | ICD-10-CM | POA: Diagnosis not present

## 2013-05-11 DIAGNOSIS — I4821 Permanent atrial fibrillation: Secondary | ICD-10-CM

## 2013-05-11 DIAGNOSIS — E785 Hyperlipidemia, unspecified: Secondary | ICD-10-CM

## 2013-05-11 DIAGNOSIS — I4891 Unspecified atrial fibrillation: Secondary | ICD-10-CM | POA: Diagnosis not present

## 2013-05-11 DIAGNOSIS — I350 Nonrheumatic aortic (valve) stenosis: Secondary | ICD-10-CM | POA: Insufficient documentation

## 2013-05-11 DIAGNOSIS — E7800 Pure hypercholesterolemia, unspecified: Secondary | ICD-10-CM | POA: Insufficient documentation

## 2013-05-11 NOTE — Assessment & Plan Note (Signed)
This is now fairly mild. It may be age related or secondary to his citalopram. He seems to compensate for it well simply by getting up slowly out of bed. I think he was less able to compensate for orthostatic hypotension while he was on rate control medications.

## 2013-05-11 NOTE — Progress Notes (Signed)
Patient ID: Cesar [name], male   DOB: 1929-07-01, 79 y.o.   MRN: [id number]     Reason for office visit Followup atrial fibrillation, valvular heart disease, hyperlipidemia  Mr. Ozturk has been well since his last appointment. He seems to have less problems with dizziness when he stands up in the morning. After discontinuation of digoxin his heart rate seems to be around 70 beats per minute at rest. He has not had problems with syncope, palpitations, bleeding, new focal neurological deficits, chest pain or shortness of breath. He does not have exertional chest pain, dyspnea or dizziness.  He has permanent atrial fibrillation on warfarin anticoagulation. He has had excellent lipid profile on statin therapy. He has a history of 2 previous strokes.   Allergies  Allergen Reactions  . Flomax (Tamsulosin Hcl) Other (See Comments)    Can't pee    Current Outpatient Prescriptions  Medication Sig Dispense Refill  . escitalopram (LEXAPRO) 10 MG tablet Take 10 mg by mouth daily.      . finasteride (PROSCAR) 5 MG tablet Take 5 mg by mouth daily.      . rosuvastatin (CRESTOR) 10 MG tablet Take 10 mg by mouth daily.        Marland Kitchen warfarin (COUMADIN) 5 MG tablet Take 5 mg by mouth See admin instructions. Take only on tues and thurs at night       No current facility-administered medications for this visit.    Past Medical History  Diagnosis Date  . Atrial fibrillation   . Prostate enlargement   . CVA (cerebral infarction)   . Aortic stenosis   . Dyslipidemia   . OSA (obstructive sleep apnea)     intol to CPAP    Past Surgical History  Procedure Laterality Date  . Prostate surgery    . US echocardiography  01/06/2012    mod. AOV ca+,mild to mod. AS,mod mostly posterior MAC-mild MR  . Nm myocar perf wall motion  08/24/2002    markedly positive,subtle lateral ischemia    Family History  Problem Relation Age of Onset  . Heart failure Mother   . Diabetes Father   . Stroke Father      History   Social History  . Marital Status: Married    Spouse Name: N/A    Number of Children: N/A  . Years of Education: N/A   Occupational History  . Not on file.   Social History Main Topics  . Smoking status: Former Games developer  . Smokeless tobacco: Former Neurosurgeon    Quit date: 10/05/1976  . Alcohol Use: No  . Drug Use: No  . Sexually Active: No   Other Topics Concern  . Not on file   Social History Narrative  . No narrative on file    Review of systems: Very hard of hearing. Orthostatic dizziness early in the morning when he gets out of bed. The patient specifically denies any chest pain at rest or with exertion, dyspnea at rest or with exertion, orthopnea, paroxysmal nocturnal dyspnea, syncope, palpitations, focal neurological deficits, intermittent claudication, lower extremity edema, unexplained weight gain, cough, hemoptysis or wheezing.  The patient also denies abdominal pain, nausea, vomiting, dysphagia, diarrhea, constipation, polyuria, polydipsia, dysuria, hematuria, frequency, urgency, abnormal bleeding or bruising, fever, chills, unexpected weight changes, mood swings, change in skin or hair texture, change in voice quality, auditory or visual problems, allergic reactions or rashes, new musculoskeletal complaints other than usual "aches and pains".   PHYSICAL EXAM BP 128/80  Pulse 73  Ht 6\' 1"  (1.854 m)  Wt 217 lb 1.6 oz (98.476 kg)  BMI 28.65 kg/m2  General: Alert, oriented x3, no distress Head: no evidence of trauma, PERRL, EOMI, no exophtalmos or lid lag, no myxedema, no xanthelasma; normal ears, nose and oropharynx Neck: normal jugular venous pulsations and no hepatojugular reflux; brisk carotid pulses without delay and no carotid bruits Chest: clear to auscultation, no signs of consolidation by percussion or palpation, normal fremitus, symmetrical and full respiratory excursions Cardiovascular: normal position and quality of the apical impulse, irregular  rhythm, normal first and second heart sounds, no rubs or gallops. Early peaking 2/6 systolic ejection murmur aortic focus radiating towards the neck Abdomen: no tenderness or distention, no masses by palpation, no abnormal pulsatility or arterial bruits, normal bowel sounds, no hepatosplenomegaly Extremities: no clubbing, cyanosis or edema; 2+ radial, ulnar and brachial pulses bilaterally; 2+ right femoral, posterior tibial and dorsalis pedis pulses; 2+ left femoral, posterior tibial and dorsalis pedis pulses; no subclavian or femoral bruits Neurological: grossly nonfocal   EKG: Atrial fibrillation minor nonspecific ST segment abnormality  Lipid Panel  The most recent lab I have shows total cholesterol 148 and LDL of 81 normal triglyceride and HDL levels  BMET    Component Value Date/Time   NA 138 01/01/2012 1120   K 4.1 01/01/2012 1120   CL 103 01/01/2012 1120   CO2 25 01/01/2012 1120   GLUCOSE 112* 01/01/2012 1120   BUN 20 01/01/2012 1120   CREATININE 1.29 01/01/2012 1120   CALCIUM 8.7 01/01/2012 1120   GFRNONAA 51* 01/01/2012 1120   GFRAA 59* 01/01/2012 1120     ASSESSMENT AND PLAN Permanent atrial fibrillation Asymptomatic. Good rate control without medications. This suggest the presence of conduction system disease and he may eventually require a pacemaker. This is not necessary as long as he is asymptomatic  Orthostatic hypotension This is now fairly mild. It may be age related or secondary to his citalopram. He seems to compensate for it well simply by getting up slowly out of bed. I think he was less able to compensate for orthostatic hypotension while he was on rate control medications.  Mild aortic stenosis By previous echocardiography roughly 2 years ago the mean gradient was only 15 mm Hg and the estimated valve area was around 1.3 cm square. Will recheck if he develops symptoms that may be attributable to aortic stenosis.  Hyperlipidemia Satisfactory lipid parameters on  Crestor therapy  Orders Placed This Encounter  Procedures  . EKG 12-Lead   Meds ordered this encounter  Medications  . escitalopram (LEXAPRO) 10 MG tablet    Sig: Take 10 mg by mouth daily.    Junious Silk, MD, [facility] [phone] office [phone] pager

## 2013-05-11 NOTE — Assessment & Plan Note (Signed)
Satisfactory lipid parameters on Crestor therapy

## 2013-05-11 NOTE — Assessment & Plan Note (Signed)
Asymptomatic. Good rate control without medications. This suggest the presence of conduction system disease and he may eventually require a pacemaker. This is not necessary as long as he is asymptomatic

## 2013-05-11 NOTE — Patient Instructions (Signed)
Your physician recommends that you schedule a follow-up appointment in: 1 year  

## 2013-05-11 NOTE — Assessment & Plan Note (Signed)
By previous echocardiography roughly 2 years ago the mean gradient was only 15 mm Hg and the estimated valve area was around 1.3 cm square. Will recheck if he develops symptoms that may be attributable to aortic stenosis.

## 2013-06-12 DIAGNOSIS — I4891 Unspecified atrial fibrillation: Secondary | ICD-10-CM | POA: Diagnosis not present

## 2013-06-12 DIAGNOSIS — I6990 Unspecified sequelae of unspecified cerebrovascular disease: Secondary | ICD-10-CM | POA: Diagnosis not present

## 2013-06-12 DIAGNOSIS — Z7901 Long term (current) use of anticoagulants: Secondary | ICD-10-CM | POA: Diagnosis not present

## 2013-07-10 DIAGNOSIS — I6990 Unspecified sequelae of unspecified cerebrovascular disease: Secondary | ICD-10-CM | POA: Diagnosis not present

## 2013-07-10 DIAGNOSIS — Z7901 Long term (current) use of anticoagulants: Secondary | ICD-10-CM | POA: Diagnosis not present

## 2013-07-10 DIAGNOSIS — I4891 Unspecified atrial fibrillation: Secondary | ICD-10-CM | POA: Diagnosis not present

## 2013-07-24 DIAGNOSIS — I4891 Unspecified atrial fibrillation: Secondary | ICD-10-CM | POA: Diagnosis not present

## 2013-07-24 DIAGNOSIS — I6990 Unspecified sequelae of unspecified cerebrovascular disease: Secondary | ICD-10-CM | POA: Diagnosis not present

## 2013-07-24 DIAGNOSIS — Z7901 Long term (current) use of anticoagulants: Secondary | ICD-10-CM | POA: Diagnosis not present

## 2013-08-14 DIAGNOSIS — E789 Disorder of lipoprotein metabolism, unspecified: Secondary | ICD-10-CM | POA: Diagnosis not present

## 2013-08-21 DIAGNOSIS — Z23 Encounter for immunization: Secondary | ICD-10-CM | POA: Diagnosis not present

## 2013-08-21 DIAGNOSIS — I4891 Unspecified atrial fibrillation: Secondary | ICD-10-CM | POA: Diagnosis not present

## 2013-08-21 DIAGNOSIS — E789 Disorder of lipoprotein metabolism, unspecified: Secondary | ICD-10-CM | POA: Diagnosis not present

## 2013-08-21 DIAGNOSIS — R0609 Other forms of dyspnea: Secondary | ICD-10-CM | POA: Diagnosis not present

## 2013-08-24 DIAGNOSIS — Z7901 Long term (current) use of anticoagulants: Secondary | ICD-10-CM | POA: Diagnosis not present

## 2013-08-24 DIAGNOSIS — I4891 Unspecified atrial fibrillation: Secondary | ICD-10-CM | POA: Diagnosis not present

## 2013-08-24 DIAGNOSIS — I6990 Unspecified sequelae of unspecified cerebrovascular disease: Secondary | ICD-10-CM | POA: Diagnosis not present

## 2013-09-25 DIAGNOSIS — I4891 Unspecified atrial fibrillation: Secondary | ICD-10-CM | POA: Diagnosis not present

## 2013-09-25 DIAGNOSIS — R05 Cough: Secondary | ICD-10-CM | POA: Diagnosis not present

## 2013-09-25 DIAGNOSIS — Z7901 Long term (current) use of anticoagulants: Secondary | ICD-10-CM | POA: Diagnosis not present

## 2013-09-25 DIAGNOSIS — I6990 Unspecified sequelae of unspecified cerebrovascular disease: Secondary | ICD-10-CM | POA: Diagnosis not present

## 2013-10-06 DIAGNOSIS — R059 Cough, unspecified: Secondary | ICD-10-CM | POA: Diagnosis not present

## 2013-10-06 DIAGNOSIS — J209 Acute bronchitis, unspecified: Secondary | ICD-10-CM | POA: Diagnosis not present

## 2013-10-06 DIAGNOSIS — E789 Disorder of lipoprotein metabolism, unspecified: Secondary | ICD-10-CM | POA: Diagnosis not present

## 2013-10-09 DIAGNOSIS — R05 Cough: Secondary | ICD-10-CM | POA: Diagnosis not present

## 2013-10-09 DIAGNOSIS — R059 Cough, unspecified: Secondary | ICD-10-CM | POA: Diagnosis not present

## 2013-10-09 DIAGNOSIS — I4891 Unspecified atrial fibrillation: Secondary | ICD-10-CM | POA: Diagnosis not present

## 2013-10-09 DIAGNOSIS — Z7901 Long term (current) use of anticoagulants: Secondary | ICD-10-CM | POA: Diagnosis not present

## 2013-10-16 DIAGNOSIS — I4891 Unspecified atrial fibrillation: Secondary | ICD-10-CM | POA: Diagnosis not present

## 2013-10-16 DIAGNOSIS — Z7901 Long term (current) use of anticoagulants: Secondary | ICD-10-CM | POA: Diagnosis not present

## 2013-10-16 DIAGNOSIS — I6990 Unspecified sequelae of unspecified cerebrovascular disease: Secondary | ICD-10-CM | POA: Diagnosis not present

## 2013-11-13 DIAGNOSIS — Z7901 Long term (current) use of anticoagulants: Secondary | ICD-10-CM | POA: Diagnosis not present

## 2013-11-13 DIAGNOSIS — I6990 Unspecified sequelae of unspecified cerebrovascular disease: Secondary | ICD-10-CM | POA: Diagnosis not present

## 2013-11-13 DIAGNOSIS — I4891 Unspecified atrial fibrillation: Secondary | ICD-10-CM | POA: Diagnosis not present

## 2013-12-11 DIAGNOSIS — Z7901 Long term (current) use of anticoagulants: Secondary | ICD-10-CM | POA: Diagnosis not present

## 2013-12-11 DIAGNOSIS — I4891 Unspecified atrial fibrillation: Secondary | ICD-10-CM | POA: Diagnosis not present

## 2013-12-11 DIAGNOSIS — I6990 Unspecified sequelae of unspecified cerebrovascular disease: Secondary | ICD-10-CM | POA: Diagnosis not present

## 2014-01-11 DIAGNOSIS — Z7901 Long term (current) use of anticoagulants: Secondary | ICD-10-CM | POA: Diagnosis not present

## 2014-01-11 DIAGNOSIS — I4891 Unspecified atrial fibrillation: Secondary | ICD-10-CM | POA: Diagnosis not present

## 2014-01-15 DIAGNOSIS — H9190 Unspecified hearing loss, unspecified ear: Secondary | ICD-10-CM | POA: Diagnosis not present

## 2014-01-15 DIAGNOSIS — E789 Disorder of lipoprotein metabolism, unspecified: Secondary | ICD-10-CM | POA: Diagnosis not present

## 2014-02-12 DIAGNOSIS — E789 Disorder of lipoprotein metabolism, unspecified: Secondary | ICD-10-CM | POA: Diagnosis not present

## 2014-02-12 DIAGNOSIS — Z125 Encounter for screening for malignant neoplasm of prostate: Secondary | ICD-10-CM | POA: Diagnosis not present

## 2014-02-12 DIAGNOSIS — I4891 Unspecified atrial fibrillation: Secondary | ICD-10-CM | POA: Diagnosis not present

## 2014-02-12 DIAGNOSIS — Z7901 Long term (current) use of anticoagulants: Secondary | ICD-10-CM | POA: Diagnosis not present

## 2014-02-12 DIAGNOSIS — Z79899 Other long term (current) drug therapy: Secondary | ICD-10-CM | POA: Diagnosis not present

## 2014-02-19 DIAGNOSIS — Z79899 Other long term (current) drug therapy: Secondary | ICD-10-CM | POA: Diagnosis not present

## 2014-02-19 DIAGNOSIS — E789 Disorder of lipoprotein metabolism, unspecified: Secondary | ICD-10-CM | POA: Diagnosis not present

## 2014-02-19 DIAGNOSIS — C61 Malignant neoplasm of prostate: Secondary | ICD-10-CM | POA: Diagnosis not present

## 2014-02-27 DIAGNOSIS — Z7901 Long term (current) use of anticoagulants: Secondary | ICD-10-CM | POA: Diagnosis not present

## 2014-02-27 DIAGNOSIS — I4891 Unspecified atrial fibrillation: Secondary | ICD-10-CM | POA: Diagnosis not present

## 2014-03-26 DIAGNOSIS — I6990 Unspecified sequelae of unspecified cerebrovascular disease: Secondary | ICD-10-CM | POA: Diagnosis not present

## 2014-03-26 DIAGNOSIS — N138 Other obstructive and reflux uropathy: Secondary | ICD-10-CM | POA: Diagnosis not present

## 2014-03-26 DIAGNOSIS — I4891 Unspecified atrial fibrillation: Secondary | ICD-10-CM | POA: Diagnosis not present

## 2014-03-26 DIAGNOSIS — N401 Enlarged prostate with lower urinary tract symptoms: Secondary | ICD-10-CM | POA: Diagnosis not present

## 2014-03-26 DIAGNOSIS — Z7901 Long term (current) use of anticoagulants: Secondary | ICD-10-CM | POA: Diagnosis not present

## 2014-03-26 DIAGNOSIS — R82998 Other abnormal findings in urine: Secondary | ICD-10-CM | POA: Diagnosis not present

## 2014-03-26 DIAGNOSIS — N281 Cyst of kidney, acquired: Secondary | ICD-10-CM | POA: Diagnosis not present

## 2014-04-23 DIAGNOSIS — Z7901 Long term (current) use of anticoagulants: Secondary | ICD-10-CM | POA: Diagnosis not present

## 2014-04-23 DIAGNOSIS — I4891 Unspecified atrial fibrillation: Secondary | ICD-10-CM | POA: Diagnosis not present

## 2014-04-26 DIAGNOSIS — R82998 Other abnormal findings in urine: Secondary | ICD-10-CM | POA: Diagnosis not present

## 2014-04-26 DIAGNOSIS — N401 Enlarged prostate with lower urinary tract symptoms: Secondary | ICD-10-CM | POA: Diagnosis not present

## 2014-04-26 DIAGNOSIS — R310 Gross hematuria: Secondary | ICD-10-CM | POA: Diagnosis not present

## 2014-04-26 DIAGNOSIS — N138 Other obstructive and reflux uropathy: Secondary | ICD-10-CM | POA: Diagnosis not present

## 2014-05-04 DIAGNOSIS — N200 Calculus of kidney: Secondary | ICD-10-CM | POA: Diagnosis not present

## 2014-05-04 DIAGNOSIS — R310 Gross hematuria: Secondary | ICD-10-CM | POA: Diagnosis not present

## 2014-05-09 DIAGNOSIS — N401 Enlarged prostate with lower urinary tract symptoms: Secondary | ICD-10-CM | POA: Diagnosis not present

## 2014-05-09 DIAGNOSIS — N139 Obstructive and reflux uropathy, unspecified: Secondary | ICD-10-CM | POA: Diagnosis not present

## 2014-05-09 DIAGNOSIS — R310 Gross hematuria: Secondary | ICD-10-CM | POA: Diagnosis not present

## 2014-05-09 DIAGNOSIS — N138 Other obstructive and reflux uropathy: Secondary | ICD-10-CM | POA: Diagnosis not present

## 2014-05-14 DIAGNOSIS — H259 Unspecified age-related cataract: Secondary | ICD-10-CM | POA: Diagnosis not present

## 2014-05-14 DIAGNOSIS — H40059 Ocular hypertension, unspecified eye: Secondary | ICD-10-CM | POA: Diagnosis not present

## 2014-05-14 DIAGNOSIS — H5200 Hypermetropia, unspecified eye: Secondary | ICD-10-CM | POA: Diagnosis not present

## 2014-05-14 DIAGNOSIS — H31009 Unspecified chorioretinal scars, unspecified eye: Secondary | ICD-10-CM | POA: Diagnosis not present

## 2014-05-16 ENCOUNTER — Encounter: Payer: Self-pay | Admitting: Cardiovascular Disease

## 2014-05-16 ENCOUNTER — Ambulatory Visit (INDEPENDENT_AMBULATORY_CARE_PROVIDER_SITE_OTHER): Payer: Medicare Other | Admitting: Cardiovascular Disease

## 2014-05-16 VITALS — BP 112/72 | HR 76 | Resp 16 | Ht 73.0 in | Wt 217.5 lb

## 2014-05-16 DIAGNOSIS — I4821 Permanent atrial fibrillation: Secondary | ICD-10-CM

## 2014-05-16 DIAGNOSIS — E785 Hyperlipidemia, unspecified: Secondary | ICD-10-CM

## 2014-05-16 DIAGNOSIS — I4891 Unspecified atrial fibrillation: Secondary | ICD-10-CM | POA: Diagnosis not present

## 2014-05-16 NOTE — Progress Notes (Signed)
Patient ID: Cesar [name], male   DOB: [birth year], 80 y.o.   MRN: [id number]     Reason for office visit Permanent atrial fibrillation  Mr. Thurman has permanent atrial fibrillation on chronic anticoagulation with warfarin and a history of 2 previous ischemic strokes. Since his last appointment he has done well without bleeding problems or new neurological events. His past medical history is remarkably negative for hypertension, diabetes, coronary or other vascular problems, heart failure, but he does have hyperlipidemia. He has a systolic murmur but his echo shows aortic valve sclerosis only. He was taking treatment with Crestor but this is temporarily on hold. He is due to discuss restarting this medication with Elliot Gault next Monday (not sure if it was stopped for side effects or simply to reevaluate his lipid profile).   Earlier this year he had hematuria related to a stone that migrated to his bladder. This was crushed with cystoscopy and has not had problems since   Allergies  Allergen Reactions  . Flomax [Tamsulosin Hcl] Other (See Comments)    Can't pee    Current Outpatient Prescriptions  Medication Sig Dispense Refill  . escitalopram (LEXAPRO) 10 MG tablet Take 10 mg by mouth daily.      . finasteride (PROSCAR) 5 MG tablet Take 5 mg by mouth daily.      . Multiple Vitamins-Minerals (EMERGEN-C VITAMIN C) PACK Take 1,000 mg by mouth daily.      Marland Kitchen warfarin (COUMADIN) 5 MG tablet Take 5 mg by mouth See admin instructions. Take only on tues and thurs at night      . rosuvastatin (CRESTOR) 10 MG tablet Take 10 mg by mouth daily.         No current facility-administered medications for this visit.    Past Medical History  Diagnosis Date  . Atrial fibrillation   . Prostate enlargement   . CVA (cerebral infarction)   . Aortic stenosis   . Dyslipidemia   . OSA (obstructive sleep apnea)     intol to CPAP    Past Surgical History  Procedure Laterality Date  . Prostate surgery     . US echocardiography  01/06/2012    mod. AOV ca+,mild to mod. AS,mod mostly posterior MAC-mild MR  . Nm myocar perf wall motion  08/24/2002    markedly positive,subtle lateral ischemia    Family History  Problem Relation Age of Onset  . Heart failure Mother   . Diabetes Father   . Stroke Father     History   Social History  . Marital Status: Married    Spouse Name: N/A    Number of Children: N/A  . Years of Education: N/A   Occupational History  . Not on file.   Social History Main Topics  . Smoking status: Former Research scientist (life sciences)  . Smokeless tobacco: Former Systems developer    Quit date: 10/05/1976  . Alcohol Use: No  . Drug Use: No  . Sexual Activity: No   Other Topics Concern  . Not on file   Social History Narrative  . No narrative on file    Review of systems: Very hard of hearing. Orthostatic dizziness early in the morning when he gets out of bed.  The patient specifically denies any chest pain at rest or with exertion, dyspnea at rest or with exertion, orthopnea, paroxysmal nocturnal dyspnea, syncope, palpitations, focal neurological deficits, intermittent claudication, lower extremity edema, unexplained weight gain, cough, hemoptysis or wheezing.  The patient also denies abdominal pain, nausea,  vomiting, dysphagia, diarrhea, constipation, polyuria, polydipsia, dysuria, hematuria, frequency, urgency, abnormal bleeding or bruising, fever, chills, unexpected weight changes, mood swings, change in skin or hair texture, change in voice quality, auditory or visual problems, allergic reactions or rashes, new musculoskeletal complaints other than usual "aches and pains".   PHYSICAL EXAM BP 112/72  Pulse 76  Resp 16  Ht 6\' 1"  (1.854 m)  Wt 217 lb 8 oz (98.657 kg)  BMI 28.70 kg/m2 General: Alert, oriented x3, no distress  Head: no evidence of trauma, PERRL, EOMI, no exophtalmos or lid lag, no myxedema, no xanthelasma; normal ears, nose and oropharynx  Neck: normal jugular venous  pulsations and no hepatojugular reflux; brisk carotid pulses without delay and no carotid bruits  Chest: clear to auscultation, no signs of consolidation by percussion or palpation, normal fremitus, symmetrical and full respiratory excursions  Cardiovascular: normal position and quality of the apical impulse, irregular rhythm, normal first and second heart sounds, no rubs or gallops. Early peaking 2/6 systolic ejection murmur aortic focus radiating towards the neck  Abdomen: no tenderness or distention, no masses by palpation, no abnormal pulsatility or arterial bruits, normal bowel sounds, no hepatosplenomegaly  Extremities: no clubbing, cyanosis or edema; 2+ radial, ulnar and brachial pulses bilaterally; 2+ right femoral, posterior tibial and dorsalis pedis pulses; 2+ left femoral, posterior tibial and dorsalis pedis pulses; no subclavian or femoral bruits  Neurological: grossly nonfocal   EKG: Atrial fibrillation , minor intraventricular conduction delay (QRS 104 ms) minor nonspecific ST segment abnormality   Lipid Panel  No results found for this basename: chol, trig, hdl, cholhdl, vldl, ldlcalc    BMET    Component Value Date/Time   NA 138 01/01/2012 1120   K 4.1 01/01/2012 1120   CL 103 01/01/2012 1120   CO2 25 01/01/2012 1120   GLUCOSE 112* 01/01/2012 1120   BUN 20 01/01/2012 1120   CREATININE 1.29 01/01/2012 1120   CALCIUM 8.7 01/01/2012 1120   GFRNONAA 51* 01/01/2012 1120   GFRAA 59* 01/01/2012 1120     ASSESSMENT AND PLAN No problem-specific assessment & plan notes found for this encounter.  Orders Placed This Encounter  Procedures  . EKG 12-Lead   Meds ordered this encounter  Medications  . Multiple Vitamins-Minerals (EMERGEN-C VITAMIN C) PACK    Sig: Take 1,000 mg by mouth daily.    Holli Humbles, MD, [facility] [phone] office [phone] pager

## 2014-05-16 NOTE — Assessment & Plan Note (Signed)
Cesar [name] has permanent atrial fibrillation with a history of 2 previous ischemic strokes, presumably embolic. He is appropriately anticoagulated and except for nephrolithiasis related hematuria has not had any bleeding complications. Rate control is adequate. No changes are made to his medications today. Will be curious to see the results of his upcoming lipid profile and evaluation with Elliot Gault.

## 2014-05-16 NOTE — Patient Instructions (Signed)
Dr. Croitoru recommends that you schedule a follow-up appointment in: One year.   

## 2014-05-28 DIAGNOSIS — Z7901 Long term (current) use of anticoagulants: Secondary | ICD-10-CM | POA: Diagnosis not present

## 2014-05-28 DIAGNOSIS — I4891 Unspecified atrial fibrillation: Secondary | ICD-10-CM | POA: Diagnosis not present

## 2014-06-25 DIAGNOSIS — I4891 Unspecified atrial fibrillation: Secondary | ICD-10-CM | POA: Diagnosis not present

## 2014-06-25 DIAGNOSIS — Z7901 Long term (current) use of anticoagulants: Secondary | ICD-10-CM | POA: Diagnosis not present

## 2014-07-23 DIAGNOSIS — Z7901 Long term (current) use of anticoagulants: Secondary | ICD-10-CM | POA: Diagnosis not present

## 2014-07-23 DIAGNOSIS — I482 Chronic atrial fibrillation: Secondary | ICD-10-CM | POA: Diagnosis not present

## 2014-08-06 DIAGNOSIS — I482 Chronic atrial fibrillation: Secondary | ICD-10-CM | POA: Diagnosis not present

## 2014-08-06 DIAGNOSIS — Z7901 Long term (current) use of anticoagulants: Secondary | ICD-10-CM | POA: Diagnosis not present

## 2014-08-09 DIAGNOSIS — Z7901 Long term (current) use of anticoagulants: Secondary | ICD-10-CM | POA: Diagnosis not present

## 2014-08-13 DIAGNOSIS — E789 Disorder of lipoprotein metabolism, unspecified: Secondary | ICD-10-CM | POA: Diagnosis not present

## 2014-08-20 DIAGNOSIS — R319 Hematuria, unspecified: Secondary | ICD-10-CM | POA: Diagnosis not present

## 2014-08-20 DIAGNOSIS — E789 Disorder of lipoprotein metabolism, unspecified: Secondary | ICD-10-CM | POA: Diagnosis not present

## 2014-08-20 DIAGNOSIS — R899 Unspecified abnormal finding in specimens from other organs, systems and tissues: Secondary | ICD-10-CM | POA: Diagnosis not present

## 2014-09-03 DIAGNOSIS — Z7901 Long term (current) use of anticoagulants: Secondary | ICD-10-CM | POA: Diagnosis not present

## 2014-09-03 DIAGNOSIS — I482 Chronic atrial fibrillation: Secondary | ICD-10-CM | POA: Diagnosis not present

## 2014-09-24 DIAGNOSIS — I482 Chronic atrial fibrillation: Secondary | ICD-10-CM | POA: Diagnosis not present

## 2014-09-24 DIAGNOSIS — Z7901 Long term (current) use of anticoagulants: Secondary | ICD-10-CM | POA: Diagnosis not present

## 2014-10-22 DIAGNOSIS — I482 Chronic atrial fibrillation: Secondary | ICD-10-CM | POA: Diagnosis not present

## 2014-10-22 DIAGNOSIS — Z7901 Long term (current) use of anticoagulants: Secondary | ICD-10-CM | POA: Diagnosis not present

## 2014-11-22 DIAGNOSIS — Z7901 Long term (current) use of anticoagulants: Secondary | ICD-10-CM | POA: Diagnosis not present

## 2014-11-22 DIAGNOSIS — I482 Chronic atrial fibrillation: Secondary | ICD-10-CM | POA: Diagnosis not present

## 2014-12-24 DIAGNOSIS — Z7901 Long term (current) use of anticoagulants: Secondary | ICD-10-CM | POA: Diagnosis not present

## 2014-12-24 DIAGNOSIS — I482 Chronic atrial fibrillation: Secondary | ICD-10-CM | POA: Diagnosis not present

## 2015-01-21 DIAGNOSIS — Z7901 Long term (current) use of anticoagulants: Secondary | ICD-10-CM | POA: Diagnosis not present

## 2015-01-21 DIAGNOSIS — I482 Chronic atrial fibrillation: Secondary | ICD-10-CM | POA: Diagnosis not present

## 2015-02-18 DIAGNOSIS — E789 Disorder of lipoprotein metabolism, unspecified: Secondary | ICD-10-CM | POA: Diagnosis not present

## 2015-02-18 DIAGNOSIS — I482 Chronic atrial fibrillation: Secondary | ICD-10-CM | POA: Diagnosis not present

## 2015-02-18 DIAGNOSIS — Z7901 Long term (current) use of anticoagulants: Secondary | ICD-10-CM | POA: Diagnosis not present

## 2015-02-28 DIAGNOSIS — Z125 Encounter for screening for malignant neoplasm of prostate: Secondary | ICD-10-CM | POA: Diagnosis not present

## 2015-02-28 DIAGNOSIS — Z7901 Long term (current) use of anticoagulants: Secondary | ICD-10-CM | POA: Diagnosis not present

## 2015-02-28 DIAGNOSIS — E789 Disorder of lipoprotein metabolism, unspecified: Secondary | ICD-10-CM | POA: Diagnosis not present

## 2015-02-28 DIAGNOSIS — I482 Chronic atrial fibrillation: Secondary | ICD-10-CM | POA: Diagnosis not present

## 2015-03-11 DIAGNOSIS — I482 Chronic atrial fibrillation: Secondary | ICD-10-CM | POA: Diagnosis not present

## 2015-03-11 DIAGNOSIS — E789 Disorder of lipoprotein metabolism, unspecified: Secondary | ICD-10-CM | POA: Diagnosis not present

## 2015-03-11 DIAGNOSIS — N400 Enlarged prostate without lower urinary tract symptoms: Secondary | ICD-10-CM | POA: Diagnosis not present

## 2015-04-01 DIAGNOSIS — I482 Chronic atrial fibrillation: Secondary | ICD-10-CM | POA: Diagnosis not present

## 2015-04-01 DIAGNOSIS — Z7901 Long term (current) use of anticoagulants: Secondary | ICD-10-CM | POA: Diagnosis not present

## 2015-04-12 DIAGNOSIS — N281 Cyst of kidney, acquired: Secondary | ICD-10-CM | POA: Diagnosis not present

## 2015-04-12 DIAGNOSIS — N401 Enlarged prostate with lower urinary tract symptoms: Secondary | ICD-10-CM | POA: Diagnosis not present

## 2015-04-12 DIAGNOSIS — N138 Other obstructive and reflux uropathy: Secondary | ICD-10-CM | POA: Diagnosis not present

## 2015-04-12 DIAGNOSIS — R310 Gross hematuria: Secondary | ICD-10-CM | POA: Diagnosis not present

## 2015-04-22 DIAGNOSIS — I7121 Aneurysm of the ascending aorta, without rupture: Secondary | ICD-10-CM | POA: Insufficient documentation

## 2015-04-22 DIAGNOSIS — N200 Calculus of kidney: Secondary | ICD-10-CM | POA: Diagnosis not present

## 2015-04-22 DIAGNOSIS — I712 Thoracic aortic aneurysm, without rupture: Secondary | ICD-10-CM | POA: Diagnosis not present

## 2015-04-22 DIAGNOSIS — R911 Solitary pulmonary nodule: Secondary | ICD-10-CM | POA: Diagnosis not present

## 2015-04-22 DIAGNOSIS — K868 Other specified diseases of pancreas: Secondary | ICD-10-CM | POA: Diagnosis not present

## 2015-04-22 DIAGNOSIS — D490 Neoplasm of unspecified behavior of digestive system: Secondary | ICD-10-CM | POA: Diagnosis not present

## 2015-04-29 DIAGNOSIS — I482 Chronic atrial fibrillation: Secondary | ICD-10-CM | POA: Diagnosis not present

## 2015-04-29 DIAGNOSIS — Z7901 Long term (current) use of anticoagulants: Secondary | ICD-10-CM | POA: Diagnosis not present

## 2015-05-01 ENCOUNTER — Encounter: Payer: PRIVATE HEALTH INSURANCE | Admitting: Surgery

## 2015-05-02 DIAGNOSIS — Z7901 Long term (current) use of anticoagulants: Secondary | ICD-10-CM | POA: Diagnosis not present

## 2015-05-08 ENCOUNTER — Encounter: Payer: Medicare Other | Admitting: Surgery

## 2015-05-10 ENCOUNTER — Institutional Professional Consult (permissible substitution) (INDEPENDENT_AMBULATORY_CARE_PROVIDER_SITE_OTHER): Payer: Medicare Other | Admitting: Surgery

## 2015-05-10 ENCOUNTER — Encounter: Payer: Self-pay | Admitting: *Deleted

## 2015-05-10 ENCOUNTER — Encounter: Payer: Self-pay | Admitting: Surgery

## 2015-05-10 VITALS — BP 107/74 | HR 95 | Resp 18 | Ht 73.0 in | Wt 220.0 lb

## 2015-05-10 DIAGNOSIS — I712 Thoracic aortic aneurysm, without rupture: Secondary | ICD-10-CM | POA: Diagnosis not present

## 2015-05-10 DIAGNOSIS — I7121 Aneurysm of the ascending aorta, without rupture: Secondary | ICD-10-CM

## 2015-05-13 ENCOUNTER — Ambulatory Visit (INDEPENDENT_AMBULATORY_CARE_PROVIDER_SITE_OTHER): Payer: Medicare Other | Admitting: Cardiovascular Disease

## 2015-05-13 ENCOUNTER — Encounter: Payer: Self-pay | Admitting: Cardiovascular Disease

## 2015-05-13 VITALS — BP 110/78 | HR 89 | Ht 73.0 in | Wt 218.8 lb

## 2015-05-13 DIAGNOSIS — I712 Thoracic aortic aneurysm, without rupture: Secondary | ICD-10-CM

## 2015-05-13 DIAGNOSIS — I951 Orthostatic hypotension: Secondary | ICD-10-CM | POA: Diagnosis not present

## 2015-05-13 DIAGNOSIS — I350 Nonrheumatic aortic (valve) stenosis: Secondary | ICD-10-CM | POA: Diagnosis not present

## 2015-05-13 DIAGNOSIS — I482 Chronic atrial fibrillation: Secondary | ICD-10-CM

## 2015-05-13 DIAGNOSIS — I7121 Aneurysm of the ascending aorta, without rupture: Secondary | ICD-10-CM

## 2015-05-13 DIAGNOSIS — E785 Hyperlipidemia, unspecified: Secondary | ICD-10-CM

## 2015-05-13 DIAGNOSIS — I4821 Permanent atrial fibrillation: Secondary | ICD-10-CM

## 2015-05-13 NOTE — Progress Notes (Signed)
Patient ID: Cesar [name], male   DOB: 14-May-1929, 81 y.o.   MRN: [id number]     Cardiology Office Note   Date:  05/13/2015   ID:  [name], DOB September 17, 1929, MRN [id number]  PCP:  Dwan Bolt, MD  Cardiologist:   [name], MD   Chief Complaint  Patient presents with  . Annual Exam    patient reports no complaints.      History of Present Illness: Cesar [name] is a 81 y.o. male who presents for permanent atrial fibrillation, history of previous stroke, moderate size aneurysm of the ascending aorta and hyperlipidemia.   He is asymptomatic. He is unaware of the arrhythmia. Rate control is excellent. He is on chronic anticoagulation with warfarin and has not had any bleeding problems or any interval neurological events. He just saw Dr. [name] a few days ago to discuss his aortic aneurysm and the plan is to monitor this on a yearly basis.    Past Medical History  Diagnosis Date  . Atrial fibrillation   . Prostate enlargement   . CVA (cerebral infarction)   . Aortic stenosis   . Dyslipidemia   . OSA (obstructive sleep apnea)     intol to CPAP  . Depression   . Hypercholesterolemia     Past Surgical History  Procedure Laterality Date  . Prostate surgery    . US echocardiography  01/06/2012    mod. AOV ca+,mild to mod. AS,mod mostly posterior MAC-mild MR  . Nm myocar perf wall motion  08/24/2002    markedly positive,subtle lateral ischemia     Current Outpatient Prescriptions  Medication Sig Dispense Refill  . atorvastatin (LIPITOR) 20 MG tablet Take 20 mg by mouth daily.    Marland Kitchen escitalopram (LEXAPRO) 10 MG tablet Take 10 mg by mouth daily.    . finasteride (PROSCAR) 5 MG tablet Take 5 mg by mouth daily.    Marland Kitchen warfarin (COUMADIN) 5 MG tablet Take 5 mg by mouth See admin instructions. Take only on tues and thurs at night OR AS DIRECTED     No current facility-administered medications for this visit.    Allergies:   Flomax    Social History:  The  patient  reports that he quit smoking about 62 years ago. His smoking use included Cigarettes. He has a 10 pack-year smoking history. He quit smokeless tobacco use about 38 years ago. He reports that he drinks alcohol. He reports that he does not use illicit drugs.   Family History:  The patient's family history includes Diabetes in his father; Heart failure in his mother; Stroke in his father.    ROS:  Please see the history of present illness.    The patient specifically denies any chest pain at rest exertion, dyspnea at rest or with exertion, orthopnea, paroxysmal nocturnal dyspnea, syncope, palpitations, focal neurological deficits, intermittent claudication, lower extremity edema, unexplained weight gain, cough, hemoptysis or wheezing. Otherwise, review of systems positive for none.   All other systems are reviewed and negative.    PHYSICAL EXAM: VS:  BP 110/78 mmHg  Pulse 89  Ht 6\' 1"  (1.854 m)  Wt 218 lb 12.8 oz (99.247 kg)  BMI 28.87 kg/m2 , BMI Body mass index is 28.87 kg/(m^2).  General: Alert, oriented x3, no distress Head: no evidence of trauma, PERRL, EOMI, no exophtalmos or lid lag, no myxedema, no xanthelasma; normal ears, nose and oropharynx Neck: normal jugular venous pulsations and no hepatojugular reflux; brisk carotid pulses without delay and  no carotid bruits Chest: clear to auscultation, no signs of consolidation by percussion or palpation, normal fremitus, symmetrical and full respiratory excursions Cardiovascular: normal position and quality of the apical impulse, irregular rhythm, normal first and second heart sounds, grade 2/6 aortic ejection early peaking  murmur, rubs or gallops Abdomen: no tenderness or distention, no masses by palpation, no abnormal pulsatility or arterial bruits, normal bowel sounds, no hepatosplenomegaly Extremities: no clubbing, cyanosis or edema; 2+ radial, ulnar and brachial pulses bilaterally; 2+ right femoral, posterior tibial and dorsalis  pedis pulses; 2+ left femoral, posterior tibial and dorsalis pedis pulses; no subclavian or femoral bruits Neurological: grossly nonfocal Psych: euthymic mood, full affect   EKG:  EKG is ordered today. The ekg ordered today demonstrates  Atrial fibrillation with controlled rate and occasional aberrant conduction   Recent Labs: No results found for requested labs within last 365 days.    Lipid Panel No results found for: CHOL, TRIG, HDL, CHOLHDL, VLDL, LDLCALC, LDLDIRECT    Wt Readings from Last 3 Encounters:  05/13/15 218 lb 12.8 oz (99.247 kg)  05/10/15 220 lb (99.791 kg)  05/16/14 217 lb 8 oz (98.657 kg)    ASSESSMENT AND PLAN:  Mr. Lazo has permanent atrial fibrillation with a history of 2 previous ischemic strokes, presumably embolic. He is appropriately anticoagulated and has not had any bleeding complications. Rate control is adequate. No changes are made to his medications today.  His anticoagulation is followed by Elliot Gault at [facility]. He is now back on a statin. He has a moderate size aneurysm of the thoracic aorta that will be followed on a yearly basis by Dr. [name].   Current medicines are reviewed at length with the patient today.  The patient does not have concerns regarding medicines.  The following changes have been made:  no change.  follow-up yearly  Labs/ tests ordered today include:  Orders Placed This Encounter  Procedures  . EKG Shary Key, MD  05/13/2015 11:16 AM    [name], MD, [facility] [phone] office [phone] pager

## 2015-05-13 NOTE — Patient Instructions (Signed)
Your physician wants you to follow-up in: 1 Year. You will receive a reminder letter in the mail two months in advance. If you don't receive a letter, please call our office to schedule the follow-up appointment.  

## 2015-05-14 ENCOUNTER — Encounter: Payer: Self-pay | Admitting: Surgery

## 2015-05-14 NOTE — Progress Notes (Signed)
PCP is Dwan Bolt, MD Referring Provider is Franchot Gallo, MD  Chief Complaint  Patient presents with  . TAA    per CT C/A/P @ [facility] 04/22/15    HPI:  The patient is an 81 year old gentleman with permanent atrial fibrillation on coumadin, a history of aortic stenosis with a mean gradient of 15 mm Hg two years ago and hematuria. He reportedly had a CT scan last year that showed a pancreatic pseudocyst and pulmonary nodules. A repeat CT of the chest on 04/25/2015 shows stable small lung nodules, extensive 3-vessel coronary calcification, a stable low-attenuation lesion in the pancreas suggestive of a pseudocyst and a 4.5 cm fusiform ascending aortic aneurysm.   Past Medical History  Diagnosis Date  . Atrial fibrillation   . Prostate enlargement   . CVA (cerebral infarction)   . Aortic stenosis   . Dyslipidemia   . OSA (obstructive sleep apnea)     intol to CPAP  . Depression   . Hypercholesterolemia     Past Surgical History  Procedure Laterality Date  . Prostate surgery    . US echocardiography  01/06/2012    mod. AOV ca+,mild to mod. AS,mod mostly posterior MAC-mild MR  . Nm myocar perf wall motion  08/24/2002    markedly positive,subtle lateral ischemia    Family History  Problem Relation Age of Onset  . Heart failure Mother   . Diabetes Father   . Stroke Father     Social History History  Substance Use Topics  . Smoking status: Former Smoker -- 1.00 packs/day for 10 years    Types: Cigarettes    Quit date: 10/05/1952  . Smokeless tobacco: Former Systems developer    Quit date: 10/05/1976  . Alcohol Use: 0.0 oz/week    0 Standard drinks or equivalent per week    Current Outpatient Prescriptions  Medication Sig Dispense Refill  . atorvastatin (LIPITOR) 20 MG tablet Take 20 mg by mouth daily.    Marland Kitchen escitalopram (LEXAPRO) 10 MG tablet Take 10 mg by mouth daily.    . finasteride (PROSCAR) 5 MG tablet Take 5 mg by mouth daily.    Marland Kitchen warfarin  (COUMADIN) 5 MG tablet Take 5 mg by mouth See admin instructions. Take only on tues and thurs at night OR AS DIRECTED     No current facility-administered medications for this visit.    Allergies  Allergen Reactions  . Flomax [Tamsulosin Hcl] Other (See Comments)    Can't pee    Review of Systems  Constitutional: Negative.   HENT: Positive for hearing loss.        Dentures  Eyes: Negative.   Respiratory: Positive for wheezing.   Cardiovascular: Positive for leg swelling.  Gastrointestinal: Negative.   Endocrine: Negative.   Musculoskeletal: Positive for gait problem.  Skin: Negative.   Allergic/Immunologic: Negative.   Neurological: Positive for numbness.       Mini-strokes  Hematological: Negative.   Psychiatric/Behavioral: Negative.     BP 107/74 mmHg  Pulse 95  Resp 18  Ht 6\' 1"  (1.854 m)  Wt 220 lb (99.791 kg)  BMI 29.03 kg/m2  SpO2 97% Physical Exam  Constitutional: He is oriented to person, place, and time. He appears well-developed and well-nourished. No distress.  HENT:  Head: Normocephalic and atraumatic.  Mouth/Throat: Oropharynx is clear and moist.  Eyes: EOM are normal. Pupils are equal, round, and reactive to light.  Neck: No JVD present.  Cardiovascular: Normal rate, regular  rhythm and intact distal pulses.   Murmur heard. 2/6 systolic murmur at RSB  Pulmonary/Chest: Effort normal and breath sounds normal. No respiratory distress.  Abdominal: Soft. Bowel sounds are normal. He exhibits no distension. There is no tenderness.  Musculoskeletal: Normal range of motion.  Lymphadenopathy:    He has no cervical adenopathy.  Neurological: He is alert and oriented to person, place, and time.  Skin: Skin is warm and dry.  Psychiatric: He has a normal mood and affect.     Diagnostic Tests:  CT scan performed at [facility]. I do not have a copy of the scan but do have the report.  Impression:  He has a 4.5 cm fusiform ascending aortic aneurysm.  This does not require any treatment at this time but should be followed up. The current recommendation for surgical treatment of an ascending aortic aneurysm is when it reaches 5.5 cm or if there is a period of rapid growth defined as 5 mm over a one year period.   Plan:  I will see him back in one year with a CTA of the chest to reassess this aneurysm.  Gaye Pollack, MD [facility] [phone]

## 2015-05-28 DIAGNOSIS — E789 Disorder of lipoprotein metabolism, unspecified: Secondary | ICD-10-CM | POA: Diagnosis not present

## 2015-05-28 DIAGNOSIS — Z7901 Long term (current) use of anticoagulants: Secondary | ICD-10-CM | POA: Diagnosis not present

## 2015-05-28 DIAGNOSIS — I482 Chronic atrial fibrillation: Secondary | ICD-10-CM | POA: Diagnosis not present

## 2015-06-21 DIAGNOSIS — H31001 Unspecified chorioretinal scars, right eye: Secondary | ICD-10-CM | POA: Diagnosis not present

## 2015-06-21 DIAGNOSIS — H2513 Age-related nuclear cataract, bilateral: Secondary | ICD-10-CM | POA: Diagnosis not present

## 2015-06-24 DIAGNOSIS — Z7901 Long term (current) use of anticoagulants: Secondary | ICD-10-CM | POA: Diagnosis not present

## 2015-06-24 DIAGNOSIS — I482 Chronic atrial fibrillation: Secondary | ICD-10-CM | POA: Diagnosis not present

## 2015-06-25 ENCOUNTER — Encounter: Payer: Self-pay | Admitting: Cardiovascular Disease

## 2015-07-22 DIAGNOSIS — Z7901 Long term (current) use of anticoagulants: Secondary | ICD-10-CM | POA: Diagnosis not present

## 2015-07-22 DIAGNOSIS — I482 Chronic atrial fibrillation: Secondary | ICD-10-CM | POA: Diagnosis not present

## 2015-07-22 DIAGNOSIS — Z23 Encounter for immunization: Secondary | ICD-10-CM | POA: Diagnosis not present

## 2015-08-19 DIAGNOSIS — Z7901 Long term (current) use of anticoagulants: Secondary | ICD-10-CM | POA: Diagnosis not present

## 2015-08-19 DIAGNOSIS — I482 Chronic atrial fibrillation: Secondary | ICD-10-CM | POA: Diagnosis not present

## 2015-09-10 DIAGNOSIS — Z79899 Other long term (current) drug therapy: Secondary | ICD-10-CM | POA: Diagnosis not present

## 2015-09-10 DIAGNOSIS — I482 Chronic atrial fibrillation: Secondary | ICD-10-CM | POA: Diagnosis not present

## 2015-09-10 DIAGNOSIS — Z7901 Long term (current) use of anticoagulants: Secondary | ICD-10-CM | POA: Diagnosis not present

## 2015-09-10 DIAGNOSIS — E789 Disorder of lipoprotein metabolism, unspecified: Secondary | ICD-10-CM | POA: Diagnosis not present

## 2015-09-16 DIAGNOSIS — N400 Enlarged prostate without lower urinary tract symptoms: Secondary | ICD-10-CM | POA: Diagnosis not present

## 2015-09-16 DIAGNOSIS — E789 Disorder of lipoprotein metabolism, unspecified: Secondary | ICD-10-CM | POA: Diagnosis not present

## 2015-09-16 DIAGNOSIS — I4891 Unspecified atrial fibrillation: Secondary | ICD-10-CM | POA: Diagnosis not present

## 2015-10-08 DIAGNOSIS — Z7901 Long term (current) use of anticoagulants: Secondary | ICD-10-CM | POA: Diagnosis not present

## 2015-10-08 DIAGNOSIS — I482 Chronic atrial fibrillation: Secondary | ICD-10-CM | POA: Diagnosis not present

## 2015-11-04 DIAGNOSIS — Z7901 Long term (current) use of anticoagulants: Secondary | ICD-10-CM | POA: Diagnosis not present

## 2015-11-04 DIAGNOSIS — I482 Chronic atrial fibrillation: Secondary | ICD-10-CM | POA: Diagnosis not present

## 2015-12-02 DIAGNOSIS — I482 Chronic atrial fibrillation: Secondary | ICD-10-CM | POA: Diagnosis not present

## 2015-12-02 DIAGNOSIS — Z7901 Long term (current) use of anticoagulants: Secondary | ICD-10-CM | POA: Diagnosis not present

## 2015-12-30 DIAGNOSIS — Z7901 Long term (current) use of anticoagulants: Secondary | ICD-10-CM | POA: Diagnosis not present

## 2015-12-30 DIAGNOSIS — I482 Chronic atrial fibrillation: Secondary | ICD-10-CM | POA: Diagnosis not present

## 2016-01-01 DIAGNOSIS — M79673 Pain in unspecified foot: Secondary | ICD-10-CM | POA: Diagnosis not present

## 2016-01-01 DIAGNOSIS — M79671 Pain in right foot: Secondary | ICD-10-CM | POA: Diagnosis not present

## 2016-01-27 DIAGNOSIS — I482 Chronic atrial fibrillation: Secondary | ICD-10-CM | POA: Diagnosis not present

## 2016-01-27 DIAGNOSIS — Z7901 Long term (current) use of anticoagulants: Secondary | ICD-10-CM | POA: Diagnosis not present

## 2016-02-24 DIAGNOSIS — Q828 Other specified congenital malformations of skin: Secondary | ICD-10-CM | POA: Diagnosis not present

## 2016-02-25 DIAGNOSIS — Z125 Encounter for screening for malignant neoplasm of prostate: Secondary | ICD-10-CM | POA: Diagnosis not present

## 2016-02-25 DIAGNOSIS — Z7901 Long term (current) use of anticoagulants: Secondary | ICD-10-CM | POA: Diagnosis not present

## 2016-02-25 DIAGNOSIS — I482 Chronic atrial fibrillation: Secondary | ICD-10-CM | POA: Diagnosis not present

## 2016-02-25 DIAGNOSIS — E789 Disorder of lipoprotein metabolism, unspecified: Secondary | ICD-10-CM | POA: Diagnosis not present

## 2016-02-25 DIAGNOSIS — Z79899 Other long term (current) drug therapy: Secondary | ICD-10-CM | POA: Diagnosis not present

## 2016-03-09 DIAGNOSIS — H5203 Hypermetropia, bilateral: Secondary | ICD-10-CM | POA: Diagnosis not present

## 2016-03-09 DIAGNOSIS — D2311 Other benign neoplasm of skin of right eyelid, including canthus: Secondary | ICD-10-CM | POA: Diagnosis not present

## 2016-03-09 DIAGNOSIS — H31001 Unspecified chorioretinal scars, right eye: Secondary | ICD-10-CM | POA: Diagnosis not present

## 2016-03-09 DIAGNOSIS — H25813 Combined forms of age-related cataract, bilateral: Secondary | ICD-10-CM | POA: Diagnosis not present

## 2016-03-16 DIAGNOSIS — E789 Disorder of lipoprotein metabolism, unspecified: Secondary | ICD-10-CM | POA: Diagnosis not present

## 2016-03-16 DIAGNOSIS — I4891 Unspecified atrial fibrillation: Secondary | ICD-10-CM | POA: Diagnosis not present

## 2016-03-19 DIAGNOSIS — I482 Chronic atrial fibrillation: Secondary | ICD-10-CM | POA: Diagnosis not present

## 2016-03-19 DIAGNOSIS — Z7901 Long term (current) use of anticoagulants: Secondary | ICD-10-CM | POA: Diagnosis not present

## 2016-03-24 DIAGNOSIS — H268 Other specified cataract: Secondary | ICD-10-CM | POA: Diagnosis not present

## 2016-03-24 DIAGNOSIS — H2511 Age-related nuclear cataract, right eye: Secondary | ICD-10-CM | POA: Diagnosis not present

## 2016-03-24 DIAGNOSIS — H25041 Posterior subcapsular polar age-related cataract, right eye: Secondary | ICD-10-CM | POA: Diagnosis not present

## 2016-03-24 DIAGNOSIS — H21561 Pupillary abnormality, right eye: Secondary | ICD-10-CM | POA: Diagnosis not present

## 2016-03-24 DIAGNOSIS — H2181 Floppy iris syndrome: Secondary | ICD-10-CM | POA: Diagnosis not present

## 2016-03-24 DIAGNOSIS — H25011 Cortical age-related cataract, right eye: Secondary | ICD-10-CM | POA: Diagnosis not present

## 2016-04-09 DIAGNOSIS — I482 Chronic atrial fibrillation: Secondary | ICD-10-CM | POA: Diagnosis not present

## 2016-04-09 DIAGNOSIS — Z7901 Long term (current) use of anticoagulants: Secondary | ICD-10-CM | POA: Diagnosis not present

## 2016-04-13 DIAGNOSIS — R3911 Hesitancy of micturition: Secondary | ICD-10-CM | POA: Diagnosis not present

## 2016-04-13 DIAGNOSIS — N401 Enlarged prostate with lower urinary tract symptoms: Secondary | ICD-10-CM | POA: Diagnosis not present

## 2016-04-14 DIAGNOSIS — H25812 Combined forms of age-related cataract, left eye: Secondary | ICD-10-CM | POA: Diagnosis not present

## 2016-04-14 DIAGNOSIS — H2181 Floppy iris syndrome: Secondary | ICD-10-CM | POA: Diagnosis not present

## 2016-04-14 DIAGNOSIS — H268 Other specified cataract: Secondary | ICD-10-CM | POA: Diagnosis not present

## 2016-04-17 ENCOUNTER — Other Ambulatory Visit: Payer: Self-pay | Admitting: Surgery

## 2016-04-17 DIAGNOSIS — I712 Thoracic aortic aneurysm, without rupture: Secondary | ICD-10-CM

## 2016-04-17 DIAGNOSIS — I7121 Aneurysm of the ascending aorta, without rupture: Secondary | ICD-10-CM

## 2016-05-11 ENCOUNTER — Encounter: Payer: Self-pay | Admitting: Cardiovascular Disease

## 2016-05-11 DIAGNOSIS — Z7901 Long term (current) use of anticoagulants: Secondary | ICD-10-CM | POA: Diagnosis not present

## 2016-05-11 DIAGNOSIS — I482 Chronic atrial fibrillation: Secondary | ICD-10-CM | POA: Diagnosis not present

## 2016-05-27 ENCOUNTER — Ambulatory Visit (INDEPENDENT_AMBULATORY_CARE_PROVIDER_SITE_OTHER): Payer: Medicare Other | Admitting: Surgery

## 2016-05-27 ENCOUNTER — Ambulatory Visit
Admission: RE | Admit: 2016-05-27 | Discharge: 2016-05-27 | Disposition: A | Discharge status: Home / Self Care | Payer: Medicare Other | Source: Ambulatory Visit | Attending: Surgery | Admitting: Surgery

## 2016-05-27 ENCOUNTER — Ambulatory Visit: Payer: PRIVATE HEALTH INSURANCE | Admitting: Cardiovascular Disease

## 2016-05-27 ENCOUNTER — Encounter: Payer: Self-pay | Admitting: Surgery

## 2016-05-27 VITALS — BP 128/85 | HR 65 | Resp 16 | Ht 73.0 in | Wt 218.0 lb

## 2016-05-27 DIAGNOSIS — I712 Thoracic aortic aneurysm, without rupture: Secondary | ICD-10-CM

## 2016-05-27 DIAGNOSIS — I7121 Aneurysm of the ascending aorta, without rupture: Secondary | ICD-10-CM

## 2016-05-27 MED ORDER — IOPAMIDOL (ISOVUE-370) INJECTION 76%
75.0000 mL | Freq: Once | INTRAVENOUS | Status: AC | PRN
  Administered 2016-05-27: 75 mL via INTRAVENOUS

## 2016-05-27 NOTE — Progress Notes (Signed)
HPI:  The patient returns for follow up of a 4.5 cm fusiform ascending aortic aneurysm and two small right lung nodules. He has been medically stable over the past year according to his wife and daughter who are with him. He is very hard of hearing.  Current Outpatient Prescriptions  Medication Sig Dispense Refill  . atorvastatin (LIPITOR) 20 MG tablet Take 10 mg by mouth daily.     Marland Kitchen escitalopram (LEXAPRO) 10 MG tablet Take 10 mg by mouth daily.    . finasteride (PROSCAR) 5 MG tablet Take 5 mg by mouth daily.    Marland Kitchen warfarin (COUMADIN) 5 MG tablet Take 5 mg by mouth See admin instructions. Take only on tues and thurs at night OR AS DIRECTED     No current facility-administered medications for this visit.      Physical Exam: BP 128/85   Pulse 65   Resp 16   Ht 6\' 1"  (1.854 m)   Wt 218 lb (98.9 kg)   SpO2 98% Comment: RA  BMI 28.76 kg/m  He looks well  There is no cervical or supraclavicular adenopathy. Cardiac exam shows a regular rate and rhythm with normal heart sounds. There is a 2/6 systolic murmur at the RUSB. Lungs are clear.  Diagnostic Tests:  CLINICAL DATA:  Thoracic ascending aortic aneurysm.  EXAM: CT ANGIOGRAPHY CHEST WITH CONTRAST  TECHNIQUE: Multidetector CT imaging of the chest was performed using the standard protocol during bolus administration of intravenous contrast. Multiplanar CT image reconstructions and MIPs were obtained to evaluate the vascular anatomy.  CONTRAST:  75 mL of Isovue 370 intravenously.  COMPARISON:  CT scan of April 22, 2015.  FINDINGS: No pneumothorax or pleural effusion is noted. Stable 5 mm nodule is noted in right middle lobe best seen on image number 73 of series 5. 6 mm nodule is noted posteriorly in right upper lobe best seen on image number 39 of series 5 which appears to be enlarged compared to prior exam. Atherosclerosis of thoracic aorta is noted without dissection. Ascending thoracic aortic aneurysm  measuring 4.5 cm is noted. Great vessels are widely patent without significant stenosis. Coronary artery calcifications are noted. Transverse aortic arch measures 3.1 cm in diameter. Descending thoracic aorta measures 3.3 cm.  No mediastinal mass or adenopathy is noted. Anterior osteophyte formation is noted in the lower thoracic spine. Superior portion of large left renal cyst is visualized in upper abdomen.  Review of the MIP images confirms the above findings.  IMPRESSION: Aortic atherosclerosis.  Stable 4.5 cm ascending thoracic aortic aneurysm. Ascending thoracic aortic aneurysm. Recommend semi-annual imaging followup by CTA or MRA and referral to cardiothoracic surgery if not already obtained. This recommendation follows 2010 ACCF/AHA/AATS/ACR/ASA/SCA/SCAI/SIR/STS/SVM Guidelines for the Diagnosis and Management of Patients With Thoracic Aortic Disease. Circulation. 2010; 121: e266-e369.  6 mm nodule is noted posteriorly in right upper lobe which appears to be enlarged compared to prior exam. Stable 5 mm nodule is noted in right middle lobe. Non-contrast chest CT at 3-6 months is recommended. If the nodules are stable at time of repeat CT, then future CT at 18-24 months (from today's scan) is considered optional for low-risk patients, but is recommended for high-risk patients. This recommendation follows the consensus statement: Guidelines for Management of Incidental Pulmonary Nodules Detected on CT Images:From the Fleischner Society 2017; published online before print (10.1148/radiol.SG:5268862).   Electronically Signed   By: Marijo Conception, M.D.   On: 05/27/2016 09:19  Impression:  The  ascending aortic aneurysm is unchanged in size and only 4.5 cm, well below the surgical threshold. The posterior RUL lung nodule is slightly enlarged at 6 mm compared to 4 mm last year. The RML nodule is unchanged at 5 mm. I think it would be best to do a follow up chest CT  without contrast in 6 months to see if there is any progression in the RUL lung nodule. I reviewed the scans with the patient and his family and answered their questions. They are in agreement.  Plan:  Follow up in 6 months with a CT scan of the chest without contrast.   Gaye Pollack, MD [facility] [phone]

## 2016-06-04 ENCOUNTER — Encounter: Payer: Self-pay | Admitting: Cardiovascular Disease

## 2016-06-04 ENCOUNTER — Ambulatory Visit (INDEPENDENT_AMBULATORY_CARE_PROVIDER_SITE_OTHER): Payer: Medicare Other | Admitting: Cardiovascular Disease

## 2016-06-04 VITALS — BP 124/84 | HR 91 | Ht 73.0 in | Wt 214.4 lb

## 2016-06-04 DIAGNOSIS — I712 Thoracic aortic aneurysm, without rupture: Secondary | ICD-10-CM

## 2016-06-04 DIAGNOSIS — I739 Peripheral vascular disease, unspecified: Secondary | ICD-10-CM | POA: Insufficient documentation

## 2016-06-04 DIAGNOSIS — Z7901 Long term (current) use of anticoagulants: Secondary | ICD-10-CM | POA: Insufficient documentation

## 2016-06-04 DIAGNOSIS — I482 Chronic atrial fibrillation: Secondary | ICD-10-CM | POA: Diagnosis not present

## 2016-06-04 DIAGNOSIS — R9431 Abnormal electrocardiogram [ECG] [EKG]: Secondary | ICD-10-CM

## 2016-06-04 DIAGNOSIS — E785 Hyperlipidemia, unspecified: Secondary | ICD-10-CM

## 2016-06-04 DIAGNOSIS — I350 Nonrheumatic aortic (valve) stenosis: Secondary | ICD-10-CM | POA: Diagnosis not present

## 2016-06-04 DIAGNOSIS — I4821 Permanent atrial fibrillation: Secondary | ICD-10-CM

## 2016-06-04 DIAGNOSIS — I7121 Aneurysm of the ascending aorta, without rupture: Secondary | ICD-10-CM

## 2016-06-04 NOTE — Patient Instructions (Signed)
Your physician has requested that you have an echocardiogram at [facility]. Echocardiography is a painless test that uses sound waves to create images of your heart. It provides your doctor with information about the size and shape of your heart and how well your heart's chambers and valves are working. This procedure takes approximately one hour. There are no restrictions for this procedure.  SCHEDULE AT [facility] has requested that you have an ankle brachial index (ABI). During this test an ultrasound and blood pressure cuff are used to evaluate the arteries that supply the arms and legs with blood. Allow thirty minutes for this exam. There are no restrictions or special instructions.   Your physician wants you to follow-up in:12 MONTHS WITH DR NO:[id number]. You will receive a reminder letter in the mail two months in advance. If you don't receive a letter, please call our office to schedule the follow-up appointment.   If you need a refill on your cardiac medications before your next appointment, please call your pharmacy.

## 2016-06-04 NOTE — Progress Notes (Signed)
Cardiology Office Note    Date:  06/04/2016   ID:  [name], DOB 1929-07-12, MRN [id number]  PCP:  Dwan Bolt, MD  Cardiologist:   [name], MD   Chief Complaint  Patient presents with  . Follow-up    cramping in legs.    History of Present Illness:  Cesar [name] is a 82 y.o. male with a moderate aneurysm of the ascending aorta (stable at 4.5 cm in size on serial CT), mild aortic stenosis and aortic insufficiency, permanent atrial fibrillation with history of previous stroke and hyperlipidemia returning for a yearly visit. He recently saw Dr. [name] and his chest CT did not show any change in the aortic aneurysm. There was a very slight change in the size of some pulmonary nodules are being monitored and he will have another CT in 6 months.  His activity level has declined. His wife has to really slowed down to be able to walk with him. He has to stop and rest because his "leg muscles get tired. I'm not sure whether he is describing claudication. He remains very hard of hearing but does not have any other neurological complaints. Last week he had a little bit of hematuria that resolved spontaneously. He sees Dr. Diona Fanti on a regular basis and this is not the first time this problem has occurred. He is also had issues with recurrent urinary retention requiring catheterization.  He denies problems with angina pectoris either at rest or with exertion and has never had problems with shortness of breath at rest. He denies palpitations or syncope and does not have exertional dizziness. He denies leg edema.  Last echocardiogram was performed in 2013 and showed normal left ventricular systolic function and wall motion, mild left ventricular hypertrophy, mild aortic stenosis and moderate aortic insufficiency. He has never required cardiac catheterization. He had a mildly abnormal nuclear stress test in 2003 suggestive of lateral wall ischemia, but I don't think this was ever  associated with symptoms.  Past Medical History:  Diagnosis Date  . Aortic stenosis   . Atrial fibrillation ([place])   . CVA (cerebral infarction)   . Depression   . Dyslipidemia   . Hypercholesterolemia   . OSA (obstructive sleep apnea)    intol to CPAP  . Prostate enlargement     Past Surgical History:  Procedure Laterality Date  . NM MYOCAR PERF WALL MOTION  08/24/2002   markedly positive,subtle lateral ischemia  . PROSTATE SURGERY    . US ECHOCARDIOGRAPHY  01/06/2012   mod. AOV ca+,mild to mod. AS,mod mostly posterior MAC-mild MR    Current Medications: Outpatient Medications Prior to Visit  Medication Sig Dispense Refill  . atorvastatin (LIPITOR) 20 MG tablet Take 10 mg by mouth daily.     Marland Kitchen escitalopram (LEXAPRO) 10 MG tablet Take 10 mg by mouth daily.    . finasteride (PROSCAR) 5 MG tablet Take 5 mg by mouth daily.    Marland Kitchen warfarin (COUMADIN) 5 MG tablet Take 5 mg by mouth See admin instructions. Take only on tues and thurs at night OR AS DIRECTED     No facility-administered medications prior to visit.      Allergies:   Flomax [tamsulosin hcl]   Social History   Social History  . Marital status: Married    Spouse name: N/A  . Number of children: 2  . Years of education: N/A   Social History Main Topics  . Smoking status: Former Smoker    Packs/day:  1.00    Years: 10.00    Types: Cigarettes    Quit date: 10/05/1952  . Smokeless tobacco: Former Systems developer    Quit date: 10/05/1976  . Alcohol use 0.0 oz/week  . Drug use: No  . Sexual activity: No   Other Topics Concern  . None   Social History Narrative  . None     Family History:  The patient's family history includes Diabetes in his father; Heart failure in his mother; Stroke in his father.   ROS:   Please see the history of present illness.    ROS All other systems reviewed and are negative.   PHYSICAL EXAM:   VS:  BP 124/84   Pulse 91   Ht 6\' 1"  (1.854 m)   Wt 214 lb 6.4 oz (97.3 kg)   BMI 28.29 kg/m     GEN: Well nourished, well developed, in no acute distress  HEENT: normal  Neck: no JVD, carotid bruits, or masses Cardiac: irregular; early peaking 2/6 aortic ejection murmur, barely audible decrescendo diastolic aortic murmur in the left lower sternal border, no rubs or gallops, no edema. He has normal radial and femoral pulses but I have difficulty identifying any pedal pulses. His feet appear pink and warm.  Respiratory:  clear to auscultation bilaterally, normal work of breathing GI: soft, nontender, nondistended, + BS MS: no deformity or atrophy  Skin: warm and dry, no rash Neuro:  Alert and Oriented x 3, Strength and sensation are intact Psych: euthymic mood, full affect  Wt Readings from Last 3 Encounters:  06/04/16 214 lb 6.4 oz (97.3 kg)  05/27/16 218 lb (98.9 kg)  05/13/15 218 lb 12.8 oz (99.2 kg)      Studies/Labs Reviewed:   EKG:  EKG is ordered today.  The ekg ordered today demonstrates Atrial fibrillation with controlled ventricular response. Mild T-wave inversion seen only in leads 3 and aVF. QTC mildly prolonged at 489 ms.   ASSESSMENT:    1. Thoracic ascending aortic aneurysm ([place])   2. Mild aortic stenosis   3. Claudication ([place])   4. Permanent atrial fibrillation ([place])   5. Long term current use of anticoagulant   6. Abnormal ECG   7. Hyperlipidemia      PLAN:  In order of problems listed above:  1. Asc Ao Aneurysm: stable at 4.5 cm in diameter, followed by Dr. Gilford Raid. 2. AS/AI: He seems to have decreased exercise tolerance and not sure if this is due to intermittent claudication or dyspnea. Heart did tell from his clinical description. I think it is worthwhile repeating his echocardiogram. 3. PAD: He has weak pedal pulses and poor exercise tolerance. Will check bilateral ankle-brachial indices. 4. AFib: Well rate controlled, on warfarin.CHADSVasc at least 4 (age 71, CVA 2). Continue anticoagulation 5. Warfarin: The most part well tolerated. He  has occasional hematuria but according to the patient and his wife this has been going on intermittently for a long time and has never been serious. Encouraged them to tell his urologist about these occurrences. 6. Abnormal ECG: Asymptomatic. Changes are nonspecific although they could represent coronary insufficiency. If his echo shows regional wall motion abnormalities or change in left ventricular ejection fraction may evaluate further. It would not be a surprise if he has some degree of coronary artery disease, but in the absence of angina on not sure revascularization would be indicated anyway. If there is LV dysfunction however further evaluation to include invasive angiography with the indicated. 7. HLP: On  lipid-lowering therapy, don't have his most recent lipid profile    Medication Adjustments/Labs and Tests Ordered: Current medicines are reviewed at length with the patient today.  Concerns regarding medicines are outlined above.  Medication changes, Labs and Tests ordered today are listed in the Patient Instructions below. Patient Instructions  Your physician has requested that you have an echocardiogram at [facility]. Echocardiography is a painless test that uses sound waves to create images of your heart. It provides your doctor with information about the size and shape of your heart and how well your heart's chambers and valves are working. This procedure takes approximately one hour. There are no restrictions for this procedure.  SCHEDULE AT [facility] has requested that you have an ankle brachial index (ABI). During this test an ultrasound and blood pressure cuff are used to evaluate the arteries that supply the arms and legs with blood. Allow thirty minutes for this exam. There are no restrictions or special instructions.   Your physician wants you to follow-up in:12 MONTHS WITH DR [id number]. You will receive a reminder letter in  the mail two months in advance. If you don't receive a letter, please call our office to schedule the follow-up appointment.   If you need a refill on your cardiac medications before your next appointment, please call your pharmacy.      Signed, [name], MD  06/04/2016 1:56 PM    [facility] [address], [place], [place]  [address] Phone: [phone]; Fax: [phone]

## 2016-06-15 DIAGNOSIS — R399 Unspecified symptoms and signs involving the genitourinary system: Secondary | ICD-10-CM | POA: Diagnosis not present

## 2016-06-15 DIAGNOSIS — R310 Gross hematuria: Secondary | ICD-10-CM | POA: Diagnosis not present

## 2016-06-16 DIAGNOSIS — R310 Gross hematuria: Secondary | ICD-10-CM | POA: Diagnosis not present

## 2016-06-16 DIAGNOSIS — N281 Cyst of kidney, acquired: Secondary | ICD-10-CM | POA: Diagnosis not present

## 2016-06-17 DIAGNOSIS — R319 Hematuria, unspecified: Secondary | ICD-10-CM | POA: Diagnosis not present

## 2016-06-17 DIAGNOSIS — T50905A Adverse effect of unspecified drugs, medicaments and biological substances, initial encounter: Secondary | ICD-10-CM | POA: Diagnosis not present

## 2016-06-17 DIAGNOSIS — I359 Nonrheumatic aortic valve disorder, unspecified: Secondary | ICD-10-CM | POA: Diagnosis not present

## 2016-06-17 DIAGNOSIS — E789 Disorder of lipoprotein metabolism, unspecified: Secondary | ICD-10-CM | POA: Diagnosis not present

## 2016-06-18 ENCOUNTER — Ambulatory Visit (HOSPITAL_COMMUNITY)
Admission: RE | Admit: 2016-06-18 | Discharge: 2016-06-18 | Disposition: A | Discharge status: Home / Self Care | Payer: Medicare Other | Source: Ambulatory Visit | Attending: Cardiovascular Disease | Admitting: Cardiovascular Disease

## 2016-06-18 ENCOUNTER — Other Ambulatory Visit: Payer: Self-pay | Admitting: Cardiovascular Disease

## 2016-06-18 ENCOUNTER — Other Ambulatory Visit: Payer: Self-pay

## 2016-06-18 ENCOUNTER — Ambulatory Visit (HOSPITAL_BASED_OUTPATIENT_CLINIC_OR_DEPARTMENT_OTHER): Payer: Medicare Other

## 2016-06-18 DIAGNOSIS — I739 Peripheral vascular disease, unspecified: Secondary | ICD-10-CM | POA: Insufficient documentation

## 2016-06-18 DIAGNOSIS — Z8673 Personal history of transient ischemic attack (TIA), and cerebral infarction without residual deficits: Secondary | ICD-10-CM | POA: Diagnosis not present

## 2016-06-18 DIAGNOSIS — I350 Nonrheumatic aortic (valve) stenosis: Secondary | ICD-10-CM | POA: Insufficient documentation

## 2016-06-18 DIAGNOSIS — I482 Chronic atrial fibrillation: Secondary | ICD-10-CM | POA: Diagnosis not present

## 2016-06-18 DIAGNOSIS — I340 Nonrheumatic mitral (valve) insufficiency: Secondary | ICD-10-CM | POA: Insufficient documentation

## 2016-06-18 DIAGNOSIS — I4821 Permanent atrial fibrillation: Secondary | ICD-10-CM

## 2016-06-18 DIAGNOSIS — I517 Cardiomegaly: Secondary | ICD-10-CM | POA: Diagnosis not present

## 2016-06-18 DIAGNOSIS — I071 Rheumatic tricuspid insufficiency: Secondary | ICD-10-CM | POA: Diagnosis not present

## 2016-06-18 DIAGNOSIS — R0989 Other specified symptoms and signs involving the circulatory and respiratory systems: Secondary | ICD-10-CM | POA: Diagnosis not present

## 2016-06-18 DIAGNOSIS — I371 Nonrheumatic pulmonary valve insufficiency: Secondary | ICD-10-CM | POA: Diagnosis not present

## 2016-06-18 DIAGNOSIS — Z720 Tobacco use: Secondary | ICD-10-CM | POA: Insufficient documentation

## 2016-06-18 DIAGNOSIS — E785 Hyperlipidemia, unspecified: Secondary | ICD-10-CM | POA: Insufficient documentation

## 2016-06-19 ENCOUNTER — Telehealth: Payer: Self-pay | Admitting: Cardiovascular Disease

## 2016-06-19 DIAGNOSIS — R351 Nocturia: Secondary | ICD-10-CM | POA: Diagnosis not present

## 2016-06-19 DIAGNOSIS — N401 Enlarged prostate with lower urinary tract symptoms: Secondary | ICD-10-CM | POA: Diagnosis not present

## 2016-06-19 DIAGNOSIS — R310 Gross hematuria: Secondary | ICD-10-CM | POA: Diagnosis not present

## 2016-06-19 NOTE — Telephone Encounter (Signed)
Pt of Dr. [name] Hx A Fib, spontaneous hematuria  Recently seen in August w recommended 1 yr f/u  Spoke to [facility] at [facility], nurse for Dr. Diona Fanti  Reports patient had significant hematuria, and has had a catheter placed and bladder irrigation performed today. Urologist wants to discontinue patient's coumadin for 5 days only (to resume Wed 9/20.) Seeking clearance. Aware I have reviewed chart, do not have recent INR encounters for dosing - I am unsure who doses his coumadin. Will discuss w DoD to review if OK to discontinue today.

## 2016-06-19 NOTE — Telephone Encounter (Signed)
Closed loop on this w Lauren and informed her all advice communicated to patient. She voiced acknowledgment.

## 2016-06-19 NOTE — Telephone Encounter (Signed)
Okay to hold warfarin. We just need to figure out who is monitoring and subsequently can be restarted.

## 2016-06-19 NOTE — Telephone Encounter (Signed)
Called patient home after I could not reach a representative at [facility] (extended hold) Advice communicated to patient's wife to have patient hold coumadin until Wednesday, per Dr. Diona Fanti request. She understands this has been reviewed by cardiologist. She notes pt has his INR checks w Dr. Wilson Singer and will return there Thursday for dosing. Advised to inform them x number of days pt is off coumadin  Aware to call our office if further questions we may address, o/w to defer to PCP for med dosing.

## 2016-06-19 NOTE — Telephone Encounter (Signed)
New Message  Pt c/o medication issue:  1. Name of Medication: Coumadin  2. How are you currently taking this medication (dosage and times per day)? 5 mg tablet  3. Are you having a reaction (difficulty breathing--STAT)? No  4. What is your medication issue? Office wanted to know if pt could be taken out off of coumadin  Please f/u

## 2016-06-24 DIAGNOSIS — R310 Gross hematuria: Secondary | ICD-10-CM | POA: Diagnosis not present

## 2016-06-29 DIAGNOSIS — I482 Chronic atrial fibrillation: Secondary | ICD-10-CM | POA: Diagnosis not present

## 2016-06-29 DIAGNOSIS — Z7901 Long term (current) use of anticoagulants: Secondary | ICD-10-CM | POA: Diagnosis not present

## 2016-07-02 ENCOUNTER — Encounter (HOSPITAL_COMMUNITY): Payer: Self-pay | Admitting: *Deleted

## 2016-07-02 ENCOUNTER — Encounter (HOSPITAL_COMMUNITY)
Admission: EM | Disposition: A | Discharge status: Skilled Nursing Facility | Payer: Self-pay | Source: Home / Self Care | Attending: Vascular Surgery

## 2016-07-02 ENCOUNTER — Inpatient Hospital Stay (HOSPITAL_COMMUNITY)
Admission: EM | Admit: 2016-07-02 | Discharge: 2016-07-06 | DRG: 272 | Disposition: A | Discharge status: Skilled Nursing Facility | Payer: Medicare Other | Attending: Vascular Surgery | Admitting: Vascular Surgery

## 2016-07-02 ENCOUNTER — Emergency Department (HOSPITAL_COMMUNITY): Payer: Medicare Other | Admitting: Anesthesiology

## 2016-07-02 ENCOUNTER — Emergency Department (HOSPITAL_COMMUNITY): Payer: Medicare Other

## 2016-07-02 DIAGNOSIS — Z833 Family history of diabetes mellitus: Secondary | ICD-10-CM

## 2016-07-02 DIAGNOSIS — H9190 Unspecified hearing loss, unspecified ear: Secondary | ICD-10-CM | POA: Diagnosis present

## 2016-07-02 DIAGNOSIS — G4733 Obstructive sleep apnea (adult) (pediatric): Secondary | ICD-10-CM | POA: Diagnosis present

## 2016-07-02 DIAGNOSIS — Z87891 Personal history of nicotine dependence: Secondary | ICD-10-CM

## 2016-07-02 DIAGNOSIS — I350 Nonrheumatic aortic (valve) stenosis: Secondary | ICD-10-CM | POA: Diagnosis present

## 2016-07-02 DIAGNOSIS — G459 Transient cerebral ischemic attack, unspecified: Secondary | ICD-10-CM | POA: Diagnosis not present

## 2016-07-02 DIAGNOSIS — I82432 Acute embolism and thrombosis of left popliteal vein: Secondary | ICD-10-CM | POA: Diagnosis present

## 2016-07-02 DIAGNOSIS — N183 Chronic kidney disease, stage 3 unspecified: Secondary | ICD-10-CM | POA: Diagnosis present

## 2016-07-02 DIAGNOSIS — I714 Abdominal aortic aneurysm, without rupture: Secondary | ICD-10-CM | POA: Diagnosis present

## 2016-07-02 DIAGNOSIS — I749 Embolism and thrombosis of unspecified artery: Secondary | ICD-10-CM | POA: Diagnosis not present

## 2016-07-02 DIAGNOSIS — N400 Enlarged prostate without lower urinary tract symptoms: Secondary | ICD-10-CM | POA: Diagnosis present

## 2016-07-02 DIAGNOSIS — M79605 Pain in left leg: Secondary | ICD-10-CM | POA: Diagnosis not present

## 2016-07-02 DIAGNOSIS — Z823 Family history of stroke: Secondary | ICD-10-CM

## 2016-07-02 DIAGNOSIS — I998 Other disorder of circulatory system: Secondary | ICD-10-CM | POA: Diagnosis not present

## 2016-07-02 DIAGNOSIS — Z8673 Personal history of transient ischemic attack (TIA), and cerebral infarction without residual deficits: Secondary | ICD-10-CM

## 2016-07-02 DIAGNOSIS — I708 Atherosclerosis of other arteries: Secondary | ICD-10-CM | POA: Diagnosis not present

## 2016-07-02 DIAGNOSIS — F329 Major depressive disorder, single episode, unspecified: Secondary | ICD-10-CM | POA: Diagnosis present

## 2016-07-02 DIAGNOSIS — I480 Paroxysmal atrial fibrillation: Secondary | ICD-10-CM | POA: Diagnosis not present

## 2016-07-02 DIAGNOSIS — I82422 Acute embolism and thrombosis of left iliac vein: Secondary | ICD-10-CM | POA: Diagnosis present

## 2016-07-02 DIAGNOSIS — Z48812 Encounter for surgical aftercare following surgery on the circulatory system: Secondary | ICD-10-CM | POA: Diagnosis not present

## 2016-07-02 DIAGNOSIS — R531 Weakness: Secondary | ICD-10-CM | POA: Diagnosis not present

## 2016-07-02 DIAGNOSIS — I743 Embolism and thrombosis of arteries of the lower extremities: Secondary | ICD-10-CM

## 2016-07-02 DIAGNOSIS — E7800 Pure hypercholesterolemia, unspecified: Secondary | ICD-10-CM | POA: Diagnosis present

## 2016-07-02 DIAGNOSIS — R310 Gross hematuria: Secondary | ICD-10-CM | POA: Diagnosis not present

## 2016-07-02 DIAGNOSIS — Z8249 Family history of ischemic heart disease and other diseases of the circulatory system: Secondary | ICD-10-CM | POA: Diagnosis not present

## 2016-07-02 DIAGNOSIS — Z7901 Long term (current) use of anticoagulants: Secondary | ICD-10-CM | POA: Diagnosis not present

## 2016-07-02 DIAGNOSIS — I4821 Permanent atrial fibrillation: Secondary | ICD-10-CM | POA: Diagnosis present

## 2016-07-02 DIAGNOSIS — I482 Chronic atrial fibrillation: Secondary | ICD-10-CM | POA: Diagnosis present

## 2016-07-02 DIAGNOSIS — I82412 Acute embolism and thrombosis of left femoral vein: Secondary | ICD-10-CM | POA: Diagnosis not present

## 2016-07-02 DIAGNOSIS — R2681 Unsteadiness on feet: Secondary | ICD-10-CM | POA: Diagnosis not present

## 2016-07-02 DIAGNOSIS — I6789 Other cerebrovascular disease: Secondary | ICD-10-CM | POA: Diagnosis not present

## 2016-07-02 DIAGNOSIS — M6281 Muscle weakness (generalized): Secondary | ICD-10-CM | POA: Diagnosis not present

## 2016-07-02 DIAGNOSIS — R262 Difficulty in walking, not elsewhere classified: Secondary | ICD-10-CM | POA: Diagnosis not present

## 2016-07-02 HISTORY — DX: Acute embolism and thrombosis of unspecified deep veins of unspecified lower extremity: I82.409

## 2016-07-02 HISTORY — DX: Permanent atrial fibrillation: I48.21

## 2016-07-02 HISTORY — DX: Other nonspecific abnormal finding of lung field: R91.8

## 2016-07-02 HISTORY — DX: Hematuria, unspecified: R31.9

## 2016-07-02 HISTORY — DX: Unspecified hearing loss, unspecified ear: H91.90

## 2016-07-02 HISTORY — PX: EMBOLECTOMY: SHX44

## 2016-07-02 HISTORY — DX: Chronic kidney disease, stage 3 unspecified: N18.30

## 2016-07-02 HISTORY — DX: Chronic kidney disease, stage 3 (moderate): N18.3

## 2016-07-02 HISTORY — DX: Thoracic aortic aneurysm, without rupture: I71.2

## 2016-07-02 LAB — CBC WITH DIFFERENTIAL/PLATELET
Basophils Absolute: 0 10*3/uL (ref 0.0–0.1)
Basophils Relative: 1 %
EOS PCT: 7 %
Eosinophils Absolute: 0.4 10*3/uL (ref 0.0–0.7)
HEMATOCRIT: 37.5 % — AB (ref 39.0–52.0)
Hemoglobin: 12.3 g/dL — ABNORMAL LOW (ref 13.0–17.0)
LYMPHS ABS: 0.8 10*3/uL (ref 0.7–4.0)
LYMPHS PCT: 13 %
MCH: 31.3 pg (ref 26.0–34.0)
MCHC: 32.8 g/dL (ref 30.0–36.0)
MCV: 95.4 fL (ref 78.0–100.0)
MONO ABS: 0.5 10*3/uL (ref 0.1–1.0)
MONOS PCT: 7 %
NEUTROS ABS: 4.7 10*3/uL (ref 1.7–7.7)
Neutrophils Relative %: 72 %
PLATELETS: 160 10*3/uL (ref 150–400)
RBC: 3.93 MIL/uL — ABNORMAL LOW (ref 4.22–5.81)
RDW: 13.2 % (ref 11.5–15.5)
WBC: 6.4 10*3/uL (ref 4.0–10.5)

## 2016-07-02 LAB — CK: CK TOTAL: 133 U/L (ref 49–397)

## 2016-07-02 LAB — COMPREHENSIVE METABOLIC PANEL
ALT: 15 U/L — ABNORMAL LOW (ref 17–63)
AST: 20 U/L (ref 15–41)
Albumin: 3.2 g/dL — ABNORMAL LOW (ref 3.5–5.0)
Alkaline Phosphatase: 42 U/L (ref 38–126)
Anion gap: 9 (ref 5–15)
BILIRUBIN TOTAL: 0.6 mg/dL (ref 0.3–1.2)
BUN: 13 mg/dL (ref 6–20)
CHLORIDE: 110 mmol/L (ref 101–111)
CO2: 22 mmol/L (ref 22–32)
CREATININE: 1.56 mg/dL — AB (ref 0.61–1.24)
Calcium: 8.6 mg/dL — ABNORMAL LOW (ref 8.9–10.3)
GFR, EST AFRICAN AMERICAN: 45 mL/min — AB (ref >60)
GFR, EST NON AFRICAN AMERICAN: 39 mL/min — AB (ref >60)
Glucose, Bld: 104 mg/dL — ABNORMAL HIGH (ref 65–99)
POTASSIUM: 3.9 mmol/L (ref 3.5–5.1)
Sodium: 141 mmol/L (ref 135–145)
TOTAL PROTEIN: 6.4 g/dL — AB (ref 6.5–8.1)

## 2016-07-02 LAB — PROTIME-INR
INR: 1.54
Prothrombin Time: 18.7 seconds — ABNORMAL HIGH (ref 11.4–15.2)

## 2016-07-02 LAB — HEPARIN LEVEL (UNFRACTIONATED): HEPARIN UNFRACTIONATED: 0.58 [IU]/mL (ref 0.30–0.70)

## 2016-07-02 LAB — MRSA PCR SCREENING: MRSA BY PCR: NEGATIVE

## 2016-07-02 LAB — APTT: APTT: 35 s (ref 24–36)

## 2016-07-02 LAB — I-STAT CG4 LACTIC ACID, ED: LACTIC ACID, VENOUS: 1.75 mmol/L (ref 0.5–1.9)

## 2016-07-02 SURGERY — EMBOLECTOMY
Anesthesia: General | Site: Groin | Laterality: Left

## 2016-07-02 MED ORDER — FENTANYL CITRATE (PF) 100 MCG/2ML IJ SOLN
INTRAMUSCULAR | Status: AC
  Filled 2016-07-02: qty 2

## 2016-07-02 MED ORDER — SODIUM CHLORIDE 0.9 % IV SOLN
INTRAVENOUS | Status: DC | PRN
  Administered 2016-07-02: 07:00:00

## 2016-07-02 MED ORDER — PHENOL 1.4 % MT LIQD: 1.0000 | OROMUCOSAL | Status: DC | PRN

## 2016-07-02 MED ORDER — SODIUM CHLORIDE 0.9 % IJ SOLN
INTRAMUSCULAR | Status: AC
  Filled 2016-07-02: qty 10

## 2016-07-02 MED ORDER — IOPAMIDOL (ISOVUE-370) INJECTION 76%
INTRAVENOUS | Status: AC
  Filled 2016-07-02: qty 100

## 2016-07-02 MED ORDER — CEFAZOLIN SODIUM-DEXTROSE 2-3 GM-% IV SOLR
INTRAVENOUS | Status: DC | PRN
  Administered 2016-07-02: 2 g via INTRAVENOUS

## 2016-07-02 MED ORDER — ONDANSETRON HCL 4 MG/2ML IJ SOLN
INTRAMUSCULAR | Status: AC
  Filled 2016-07-02: qty 2

## 2016-07-02 MED ORDER — SUCCINYLCHOLINE CHLORIDE 200 MG/10ML IV SOSY
PREFILLED_SYRINGE | INTRAVENOUS | Status: AC
  Filled 2016-07-02: qty 10

## 2016-07-02 MED ORDER — SUCCINYLCHOLINE CHLORIDE 20 MG/ML IJ SOLN
INTRAMUSCULAR | Status: DC | PRN
  Administered 2016-07-02: 120 mg via INTRAVENOUS

## 2016-07-02 MED ORDER — OXYCODONE HCL 5 MG/5ML PO SOLN: 5.0000 mg | Freq: Once | ORAL | Status: DC | PRN

## 2016-07-02 MED ORDER — ONDANSETRON HCL 4 MG/2ML IJ SOLN: 4.0000 mg | Freq: Once | INTRAMUSCULAR | Status: DC | PRN

## 2016-07-02 MED ORDER — POTASSIUM CHLORIDE CRYS ER 20 MEQ PO TBCR
20.0000 meq | EXTENDED_RELEASE_TABLET | Freq: Every day | ORAL | Status: DC | PRN
Start: 2016-07-02 — End: 2016-07-06

## 2016-07-02 MED ORDER — ALUM & MAG HYDROXIDE-SIMETH 200-200-20 MG/5ML PO SUSP: 15.0000 mL | ORAL | Status: DC | PRN

## 2016-07-02 MED ORDER — FENTANYL CITRATE (PF) 100 MCG/2ML IJ SOLN
25.0000 ug | INTRAMUSCULAR | Status: DC | PRN
  Administered 2016-07-02 (×2): 25 ug via INTRAVENOUS

## 2016-07-02 MED ORDER — FENTANYL CITRATE (PF) 100 MCG/2ML IJ SOLN
INTRAMUSCULAR | Status: DC | PRN
  Administered 2016-07-02: 50 ug via INTRAVENOUS
  Administered 2016-07-02: 100 ug via INTRAVENOUS

## 2016-07-02 MED ORDER — ONDANSETRON HCL 4 MG/2ML IJ SOLN
INTRAMUSCULAR | Status: DC | PRN
  Administered 2016-07-02: 4 mg via INTRAVENOUS

## 2016-07-02 MED ORDER — SODIUM CHLORIDE 0.9 % IV SOLN
INTRAVENOUS | Status: DC
  Administered 2016-07-02: 12:00:00 via INTRAVENOUS

## 2016-07-02 MED ORDER — 0.9 % SODIUM CHLORIDE (POUR BTL) OPTIME
TOPICAL | Status: DC | PRN
  Administered 2016-07-02: 2000 mL

## 2016-07-02 MED ORDER — SUGAMMADEX SODIUM 200 MG/2ML IV SOLN
INTRAVENOUS | Status: AC
  Filled 2016-07-02: qty 2

## 2016-07-02 MED ORDER — MAGNESIUM SULFATE 2 GM/50ML IV SOLN
2.0000 g | Freq: Every day | INTRAVENOUS | Status: DC | PRN
  Filled 2016-07-02: qty 50

## 2016-07-02 MED ORDER — METOPROLOL TARTRATE 5 MG/5ML IV SOLN: 2.0000 mg | INTRAVENOUS | Status: DC | PRN

## 2016-07-02 MED ORDER — BISACODYL 5 MG PO TBEC: 5.0000 mg | DELAYED_RELEASE_TABLET | Freq: Every day | ORAL | Status: DC | PRN

## 2016-07-02 MED ORDER — HEPARIN SODIUM (PORCINE) 1000 UNIT/ML IJ SOLN
INTRAMUSCULAR | Status: DC | PRN
  Administered 2016-07-02: 5000 [IU] via INTRAVENOUS

## 2016-07-02 MED ORDER — DEXTROSE 5 % IV SOLN
INTRAVENOUS | Status: DC | PRN
  Administered 2016-07-02: 15 ug/min via INTRAVENOUS

## 2016-07-02 MED ORDER — ROCURONIUM BROMIDE 10 MG/ML (PF) SYRINGE
PREFILLED_SYRINGE | INTRAVENOUS | Status: AC
  Filled 2016-07-02: qty 10

## 2016-07-02 MED ORDER — LABETALOL HCL 5 MG/ML IV SOLN: 10.0000 mg | INTRAVENOUS | Status: DC | PRN

## 2016-07-02 MED ORDER — LIDOCAINE 2% (20 MG/ML) 5 ML SYRINGE
INTRAMUSCULAR | Status: AC
  Filled 2016-07-02: qty 5

## 2016-07-02 MED ORDER — DOCUSATE SODIUM 100 MG PO CAPS
100.0000 mg | ORAL_CAPSULE | Freq: Every day | ORAL | Status: DC
  Administered 2016-07-03 – 2016-07-06 (×4): 100 mg via ORAL
  Filled 2016-07-02 (×4): qty 1

## 2016-07-02 MED ORDER — ONDANSETRON HCL 4 MG/2ML IJ SOLN: 4.0000 mg | Freq: Four times a day (QID) | INTRAMUSCULAR | Status: DC | PRN

## 2016-07-02 MED ORDER — HEPARIN (PORCINE) IN NACL 100-0.45 UNIT/ML-% IJ SOLN
1300.0000 [IU]/h | INTRAMUSCULAR | Status: DC
  Administered 2016-07-02: 1300 [IU]/h via INTRAVENOUS
  Filled 2016-07-02: qty 250

## 2016-07-02 MED ORDER — LACTATED RINGERS IV SOLN
INTRAVENOUS | Status: DC | PRN
  Administered 2016-07-02: 06:00:00 via INTRAVENOUS

## 2016-07-02 MED ORDER — PROPOFOL 10 MG/ML IV BOLUS
INTRAVENOUS | Status: DC | PRN
  Administered 2016-07-02: 120 mg via INTRAVENOUS

## 2016-07-02 MED ORDER — PROPOFOL 10 MG/ML IV BOLUS
INTRAVENOUS | Status: AC
  Filled 2016-07-02: qty 20

## 2016-07-02 MED ORDER — DEXTROSE 5 % IV SOLN
1.5000 g | Freq: Two times a day (BID) | INTRAVENOUS | Status: AC
  Administered 2016-07-02 (×2): 1.5 g via INTRAVENOUS
  Filled 2016-07-02 (×2): qty 1.5

## 2016-07-02 MED ORDER — ACETAMINOPHEN 325 MG PO TABS
325.0000 mg | ORAL_TABLET | ORAL | Status: DC | PRN
  Administered 2016-07-04: 650 mg via ORAL
  Filled 2016-07-02: qty 2

## 2016-07-02 MED ORDER — SODIUM CHLORIDE 0.9 % IV SOLN: 500.0000 mL | Freq: Once | INTRAVENOUS | Status: DC | PRN

## 2016-07-02 MED ORDER — EPHEDRINE 5 MG/ML INJ
INTRAVENOUS | Status: AC
  Filled 2016-07-02: qty 10

## 2016-07-02 MED ORDER — FINASTERIDE 5 MG PO TABS
5.0000 mg | ORAL_TABLET | Freq: Every day | ORAL | Status: DC
  Administered 2016-07-02 – 2016-07-06 (×5): 5 mg via ORAL
  Filled 2016-07-02 (×5): qty 1

## 2016-07-02 MED ORDER — MORPHINE SULFATE (PF) 2 MG/ML IV SOLN
2.0000 mg | INTRAVENOUS | Status: DC | PRN
  Administered 2016-07-02: 2 mg via INTRAVENOUS
  Administered 2016-07-02: 4 mg via INTRAVENOUS
  Filled 2016-07-02: qty 1
  Filled 2016-07-02: qty 2

## 2016-07-02 MED ORDER — GUAIFENESIN-DM 100-10 MG/5ML PO SYRP: 15.0000 mL | ORAL_SOLUTION | ORAL | Status: DC | PRN

## 2016-07-02 MED ORDER — OXYCODONE-ACETAMINOPHEN 5-325 MG PO TABS
1.0000 | ORAL_TABLET | ORAL | Status: DC | PRN
  Administered 2016-07-02 (×3): 2 via ORAL
  Administered 2016-07-03: 1 via ORAL
  Filled 2016-07-02: qty 1
  Filled 2016-07-02 (×3): qty 2

## 2016-07-02 MED ORDER — IOPAMIDOL (ISOVUE-370) INJECTION 76%
80.0000 mL | Freq: Once | INTRAVENOUS | Status: AC | PRN
  Administered 2016-07-02: 80 mL via INTRAVENOUS

## 2016-07-02 MED ORDER — ACETAMINOPHEN 325 MG RE SUPP: 325.0000 mg | RECTAL | Status: DC | PRN

## 2016-07-02 MED ORDER — ROCURONIUM BROMIDE 100 MG/10ML IV SOLN
INTRAVENOUS | Status: DC | PRN
  Administered 2016-07-02: 40 mg via INTRAVENOUS

## 2016-07-02 MED ORDER — SUGAMMADEX SODIUM 200 MG/2ML IV SOLN
INTRAVENOUS | Status: DC | PRN
  Administered 2016-07-02: 200 mg via INTRAVENOUS

## 2016-07-02 MED ORDER — CEFAZOLIN SODIUM 1 G IJ SOLR
INTRAMUSCULAR | Status: AC
  Filled 2016-07-02: qty 20

## 2016-07-02 MED ORDER — PANTOPRAZOLE SODIUM 40 MG PO TBEC
40.0000 mg | DELAYED_RELEASE_TABLET | Freq: Every day | ORAL | Status: DC
  Administered 2016-07-02 – 2016-07-06 (×5): 40 mg via ORAL
  Filled 2016-07-02 (×5): qty 1

## 2016-07-02 MED ORDER — HEPARIN (PORCINE) IN NACL 100-0.45 UNIT/ML-% IJ SOLN
1150.0000 [IU]/h | INTRAMUSCULAR | Status: DC
  Administered 2016-07-02 – 2016-07-04 (×3): 1000 [IU]/h via INTRAVENOUS
  Filled 2016-07-02 (×2): qty 250

## 2016-07-02 MED ORDER — SENNOSIDES-DOCUSATE SODIUM 8.6-50 MG PO TABS: 1.0000 | ORAL_TABLET | Freq: Every evening | ORAL | Status: DC | PRN

## 2016-07-02 MED ORDER — ESCITALOPRAM OXALATE 10 MG PO TABS
10.0000 mg | ORAL_TABLET | Freq: Every day | ORAL | Status: DC
  Administered 2016-07-02 – 2016-07-06 (×4): 10 mg via ORAL
  Filled 2016-07-02 (×5): qty 1

## 2016-07-02 MED ORDER — OXYCODONE HCL 5 MG PO TABS: 5.0000 mg | ORAL_TABLET | Freq: Once | ORAL | Status: DC | PRN

## 2016-07-02 MED ORDER — LIDOCAINE HCL (CARDIAC) 20 MG/ML IV SOLN
INTRAVENOUS | Status: DC | PRN
  Administered 2016-07-02: 40 mg via INTRATRACHEAL

## 2016-07-02 MED ORDER — HYDRALAZINE HCL 20 MG/ML IJ SOLN: 5.0000 mg | INTRAMUSCULAR | Status: DC | PRN

## 2016-07-02 SURGICAL SUPPLY — 53 items
BANDAGE ESMARK 6X9 LF (GAUZE/BANDAGES/DRESSINGS) IMPLANT
BENZOIN TINCTURE PRP APPL 2/3 (GAUZE/BANDAGES/DRESSINGS) ×4 IMPLANT
BNDG ESMARK 6X9 LF (GAUZE/BANDAGES/DRESSINGS)
CANISTER SUCTION 2500CC (MISCELLANEOUS) ×4 IMPLANT
CANNULA VESSEL 3MM 2 BLNT TIP (CANNULA) ×8 IMPLANT
CATH EMB 3FR 80CM (CATHETERS) ×4 IMPLANT
CATH EMB 4FR 80CM (CATHETERS) IMPLANT
CATH EMB 5FR 80CM (CATHETERS) IMPLANT
CLIP LIGATING EXTRA MED SLVR (CLIP) ×8 IMPLANT
CLIP LIGATING EXTRA SM BLUE (MISCELLANEOUS) ×8 IMPLANT
CLOSURE WOUND 1/2 X4 (GAUZE/BANDAGES/DRESSINGS) ×1
CUFF TOURNIQUET SINGLE 34IN LL (TOURNIQUET CUFF) IMPLANT
CUFF TOURNIQUET SINGLE 44IN (TOURNIQUET CUFF) IMPLANT
DRAIN SNY 10X20 3/4 PERF (WOUND CARE) IMPLANT
DRAPE X-RAY CASS 24X20 (DRAPES) IMPLANT
DRSG COVADERM 4X10 (GAUZE/BANDAGES/DRESSINGS) IMPLANT
DRSG COVADERM 4X8 (GAUZE/BANDAGES/DRESSINGS) ×4 IMPLANT
ELECT REM PT RETURN 9FT ADLT (ELECTROSURGICAL) ×4
ELECTRODE REM PT RTRN 9FT ADLT (ELECTROSURGICAL) ×2 IMPLANT
EVACUATOR SILICONE 100CC (DRAIN) IMPLANT
GLOVE BIO SURGEON STRL SZ 6.5 (GLOVE) ×3 IMPLANT
GLOVE BIO SURGEONS STRL SZ 6.5 (GLOVE) ×1
GLOVE BIOGEL PI IND STRL 6.5 (GLOVE) ×2 IMPLANT
GLOVE BIOGEL PI INDICATOR 6.5 (GLOVE) ×2
GLOVE ECLIPSE 6.5 STRL STRAW (GLOVE) ×4 IMPLANT
GLOVE SS BIOGEL STRL SZ 7.5 (GLOVE) ×2 IMPLANT
GLOVE SUPERSENSE BIOGEL SZ 7.5 (GLOVE) ×2
GLOVE SURG SS PI 6.5 STRL IVOR (GLOVE) ×12 IMPLANT
GOWN STRL REUS W/ TWL LRG LVL3 (GOWN DISPOSABLE) ×8 IMPLANT
GOWN STRL REUS W/TWL LRG LVL3 (GOWN DISPOSABLE) ×8
INSERT FOGARTY SM (MISCELLANEOUS) ×4 IMPLANT
KIT BASIN OR (CUSTOM PROCEDURE TRAY) ×4 IMPLANT
KIT ROOM TURNOVER OR (KITS) ×4 IMPLANT
NS IRRIG 1000ML POUR BTL (IV SOLUTION) ×8 IMPLANT
PACK PERIPHERAL VASCULAR (CUSTOM PROCEDURE TRAY) ×4 IMPLANT
PAD ARMBOARD 7.5X6 YLW CONV (MISCELLANEOUS) ×8 IMPLANT
PADDING CAST COTTON 6X4 STRL (CAST SUPPLIES) IMPLANT
SET COLLECT BLD 21X3/4 12 (NEEDLE) IMPLANT
STAPLER VISISTAT 35W (STAPLE) IMPLANT
STOPCOCK 4 WAY LG BORE MALE ST (IV SETS) IMPLANT
STRIP CLOSURE SKIN 1/2X4 (GAUZE/BANDAGES/DRESSINGS) ×3 IMPLANT
SUT ETHILON 3 0 PS 1 (SUTURE) IMPLANT
SUT PROLENE 5 0 C 1 24 (SUTURE) ×8 IMPLANT
SUT PROLENE 6 0 CC (SUTURE) ×4 IMPLANT
SUT SILK 2 0 SH (SUTURE) IMPLANT
SUT VIC AB 2-0 CTX 36 (SUTURE) ×4 IMPLANT
SUT VIC AB 3-0 SH 27 (SUTURE) ×2
SUT VIC AB 3-0 SH 27X BRD (SUTURE) ×2 IMPLANT
SYR 3ML LL SCALE MARK (SYRINGE) ×4 IMPLANT
TRAY FOLEY W/METER SILVER 16FR (SET/KITS/TRAYS/PACK) ×4 IMPLANT
TUBING EXTENTION W/L.L. (IV SETS) IMPLANT
UNDERPAD 30X30 (UNDERPADS AND DIAPERS) ×4 IMPLANT
WATER STERILE IRR 1000ML POUR (IV SOLUTION) ×4 IMPLANT

## 2016-07-02 NOTE — Progress Notes (Signed)
PT Cancellation Note  Patient Details Name: COLM HERPEL MRN: [id number] DOB: [birth year]   Cancelled Treatment:    Reason Eval/Treat Not Completed: Other (comment) (order received with pt s/p embolectomy with ambulation to start next date. Will hold til tomorrow)   Lanetta Inch Beth 07/02/2016, 1:43 PM Elwyn Reach, [place]

## 2016-07-02 NOTE — Progress Notes (Signed)
Report given to robin roberts rn as caregiver 

## 2016-07-02 NOTE — Consult Note (Signed)
Cardiology Consultation Note    Patient ID: Cesar [name], MRN: [id number], DOB/AGE: 1929/01/27 82 y.o. Admit date: 07/02/2016   Date of Consult: 07/02/2016 Primary Physician: Dwan Bolt, MD Primary Cardiologist: Dr. [name]  Chief Complaint: Leg pain Reason for Consultation: LLE embolism in setting of stopping coumadin Requesting MD: Dr. Donnetta Hutching  HPI: Cesar [name] is a 82 y.o. male with history of permanent atrial fib, history of stroke, probable CKD stage III per labs (baseline Cr 1.4-1.5 in 2016-2017 by PCP), hyperlipidemia, moderate ascending aortic aneurysm (stable 4.cm in 05/2016 followed by Dr. [name]), pulm nodules being followed, mild-mod AS, depression, dyslipidemia, OSA intolerant of CPAP, DVT 2006, prostate enlargement s/p TURP, very remote smoker who presented to [facility] with ischemic leg. Last echo 06/2016 showed mild LVH, EF 60-65%, mild AS by gradient and moderate visually, ascending aorta dimension 81mm, aortic root dimension 40mm, mild-mod calcified mitral annulus, severe LAE, normal RV function, severe RAE. Has remote hx of abnormal nuc 2003 suggesting lateral wall ischemia but not known to have symptoms at that time. Per recent phone notes he's had recent problems with significant hematuria/clots requiring urology eval with catheter placement and bladder irrigation. Per communication with our office, urology had requested to hold Coumadin for 5 days only at which time he was cleared to do so by DOD. He was off it for 5 days, restarted 06/30/16. INR followed by PCP. He presented to the ER much earlier this morning/overnight with weakness and pain in his left leg. CT angio showed complete occlusion of his common femoral artery and he underwent left femoral exploration with embolectomy of external iliac, common femoral, profundus femoral artery this morning for left lower extremity presumed cardiogenic embolus. We have been asked to come on board to weigh in on anticoagulation.  Recent urine draining yellow. Urology resident has seen the patient and recommended to start anticoagulation per primary team and monitor the degree of recurrent, if any, hematuria.  At present time he is resting comfortably in bed. Denies any chest pain, SOB, palpitations or awareness of afib. No significant complaint voiced. Has not noticed any recurrent hematuria but INR has not yet gotten back therapeutic - 1.54 on adm.  Past Medical History:  Diagnosis Date  . Aortic stenosis    a. mild-mod by echo 06/2016.  . Ascending aortic aneurysm ([place])   . CKD (chronic kidney disease), stage III    baseline Cr 1.4-1.5 in 2016-2017 by PCP)  . CVA (cerebral infarction)   . Depression   . Dyslipidemia   . Hematuria   . HOH (hard of hearing)   . Hypercholesterolemia   . OSA (obstructive sleep apnea)    intol to CPAP  . Permanent atrial fibrillation ([place])   . Prostate enlargement   . Pulmonary nodules       Surgical History:  Past Surgical History:  Procedure Laterality Date  . NM MYOCAR PERF WALL MOTION  08/24/2002   markedly positive,subtle lateral ischemia  . PROSTATE SURGERY    . US ECHOCARDIOGRAPHY  01/06/2012   mod. AOV ca+,mild to mod. AS,mod mostly posterior MAC-mild MR     Home Meds: Prior to Admission medications   Medication Sig Start Date End Date Taking? Authorizing Provider  escitalopram (LEXAPRO) 10 MG tablet Take 10 mg by mouth daily.   Yes Historical Provider, MD  finasteride (PROSCAR) 5 MG tablet Take 5 mg by mouth daily.   Yes Historical Provider, MD  warfarin (COUMADIN) 5 MG tablet Take 7.5 mg by  mouth daily.    Yes Historical Provider, MD    Inpatient Medications:  . cefUROXime (ZINACEF)  IV  1.5 g Intravenous Q12H  . [START ON 07/03/2016] docusate sodium  100 mg Oral Daily  . escitalopram  10 mg Oral Daily  . fentaNYL      . finasteride  5 mg Oral Daily  . iopamidol      . pantoprazole  40 mg Oral Daily   . sodium chloride 125 mL/hr at 07/02/16 1225  .  heparin 1,000 Units/hr (07/02/16 1233)    Allergies:  Allergies  Allergen Reactions  . Flomax [Tamsulosin Hcl] Other (See Comments)    Can't pee    Social History   Social History  . Marital status: Married    Spouse name: N/A  . Number of children: 2  . Years of education: N/A   Occupational History  . Not on file.   Social History Main Topics  . Smoking status: Former Smoker    Packs/day: 1.00    Years: 10.00    Types: Cigarettes    Quit date: 10/05/1952  . Smokeless tobacco: Former Systems developer    Quit date: 10/05/1976  . Alcohol use 0.0 oz/week  . Drug use: No  . Sexual activity: No   Other Topics Concern  . Not on file   Social History Narrative  . No narrative on file     Family History  Problem Relation Age of Onset  . Heart failure Mother   . Diabetes Father   . Stroke Father      Review of Systems: All other systems reviewed and are otherwise negative except as noted above.  Labs:  Recent Labs  07/02/16 0350  CKTOTAL 133   Lab Results  Component Value Date   WBC 6.4 07/02/2016   HGB 12.3 (L) 07/02/2016   HCT 37.5 (L) 07/02/2016   MCV 95.4 07/02/2016   PLT 160 07/02/2016    Recent Labs Lab 07/02/16 0350  NA 141  K 3.9  CL 110  CO2 22  BUN 13  CREATININE 1.56*  CALCIUM 8.6*  PROT 6.4*  BILITOT 0.6  ALKPHOS 42  ALT 15*  AST 20  GLUCOSE 104*   No results found for: CHOL, HDL, LDLCALC, TRIG No results found for: DDIMER  Radiology/Studies:  Ct Angio Ao+bifem W &/or Wo Contrast  Result Date: 07/02/2016 CLINICAL DATA:  82 year old male with left leg weakness and difficulty using left leg. EXAM: CT ANGIOGRAPHY OF ABDOMINAL AORTA WITH ILIOFEMORAL RUNOFF TECHNIQUE: Multidetector CT imaging of the abdomen, pelvis and lower extremities was performed using the standard protocol during bolus administration of intravenous contrast. Multiplanar CT image reconstructions and MIPs were obtained to evaluate the vascular anatomy. CONTRAST:  80 cc Isovue  370 COMPARISON:  CT of the abdomen pelvis dated 04/22/2015 FINDINGS: The visualized lung bases are clear.  Mild cardiomegaly. No intra-abdominal free air or free fluid. The liver, gallbladder, pancreas, spleen, and the adrenal glands appear unremarkable. Grossly stable appearing left renal inferior pole 15 cm cyst. Smaller left renal hypodense lesions are not well characterized but likely represent cysts. There is moderate bilateral renal parenchymal atrophy. Punctate nonobstructing right renal upper pole calculus versus vascular calcification. There is no hydronephrosis on either side. The visualized ureters and urinary bladder appear unremarkable. Mild enlargement of the prostate gland measuring 7 cm in greatest transverse axial dimension. Scattered sigmoid diverticula without active inflammatory changes. There is moderate stool throughout the colon. There is no evidence of bowel obstruction  or active inflammation. Normal appendix. There is moderate aortoiliac atherosclerotic disease. There is no aneurysmal dilatation or dissection of the abdominal aorta. The origins of the celiac axis, SMA, IMA as well as the origins of the renal arteries remain patent. There is a classic celiac axis branching anatomy. There is atherosclerotic calcification of the common iliac arteries. No aneurysmal distention or dissection. LEFT LEG: There is complete occlusion of the distal left external iliac artery and common femoral artery. There is high-grade occlusion of the origin of the profundus femoris artery. There is clot with partial occlusion of the majority of the profundus femoris artery. There is high-grade narrowing of the left superficial femoral artery with very minimal flow in the proximal portion of the SFA. There is near complete occlusion of the midportion of the SFA. There is high-grade occlusion of the distal SFA with reconstitution of minimal flow. There is high-grade occlusion of the popliteal artery with minimal  flow. There is advanced atherosclerotic disease of the calf arteries. No flow identified in the distal popliteal artery or in the calf arteries. No flow seen in the plantar artery or dorsalis pedis artery. RIGHT LEG: The right common femoral artery, deep and superficial femoral arteries, and the popliteal artery appear patent. There is atherosclerotic calcification of the anterior and posterior tibial arteries as well as the peroneal artery. There is diminished flow within the calf arteries of the right lower extremity. There is very diminished flow or absent flow in the anterior and posterior tibial arteries as well as the peroneal artery distally. No significant flow identified in the dorsalis pedis or plantar artery of the right foot on this CT. Evaluation however is limited due to extensive atherosclerotic calcification of the calf arteries and arteries of the foot. Correlation with clinical exam and duplex ultrasound recommended. IMPRESSION: LEFT LEG: Near complete occlusion of the distal external iliac, and common femoral artery on the left with extensive high-grade narrowing of the left superficial femoral artery and near complete occlusion of the midportion of the SFA. High-grade narrowing of the distal SFA and popliteal artery with very diminished flow. Essentially no flow is identified in the left lower extremity below the knee. There is advanced atherosclerotic disease of the arteries of the calf. RIGHT LEG: Patent femoral and popliteal arteries. Advanced atherosclerotic calcification of the arteries of the calf and foot with diminished flow within this vessels. Minimal or absent flow in the arteries of the right lower extremity at and distal to the ankle. Evaluation however is somewhat limited due to advanced atherosclerotic disease. Correlation with clinical exam and duplex ultrasound recommended. No acute intra-abdominal or pelvic pathology identified. Moderate to advanced aortoiliac atherosclerotic  disease. No aneurysmal dilatation or dissection. These results were called by telephone at the time of interpretation on 07/02/2016 at 6:23 am to Dr. Joseph Berkshire , who verbally acknowledged these results. Electronically Signed   By: Anner Crete M.D.   On: 07/02/2016 06:33    Wt Readings from Last 3 Encounters:  07/02/16 212 lb 11.9 oz (96.5 kg)  06/04/16 214 lb 6.4 oz (97.3 kg)  05/27/16 218 lb (98.9 kg)    EKG: known afib 89bpm slightly prolonged QT  Physical Exam: Blood pressure (!) 142/71, pulse 82, temperature 98 F (36.7 C), temperature source Oral, resp. rate (!) 26, height 6\' 1"  (1.854 m), weight 212 lb 11.9 oz (96.5 kg), SpO2 96 %. Body mass index is 28.07 kg/m. General: Well developed, well nourished WM, in no acute distress. Head: Normocephalic, atraumatic,  sclera non-icteric, no xanthomas, nares are without discharge.  Neck: Negative for carotid bruits. JVD not elevated. Lungs: Clear bilaterally to auscultation without wheezes, rales, or rhonchi. Breathing is unlabored. Heart: RRR with S1 S2. No murmurs, rubs, or gallops appreciated. Abdomen: Soft, non-tender, non-distended with normoactive bowel sounds. No hepatomegaly. No rebound/guarding. No obvious abdominal masses. Msk:  Strength and tone appear normal for age. Extremities: No clubbing or cyanosis. No edema.  BLE warm equally. Neuro: Alert and oriented X 3. No facial asymmetry. No focal deficit. Moves all extremities spontaneously. Psych:  Responds to questions appropriately with a normal affect.     Assessment and Plan  Permanent atrial fib, history of stroke, probable CKD stage III per labs (baseline Cr 1.4-1.5 in 2016-2017 by PCP), hyperlipidemia, moderate ascending aortic aneurysm (stable 4.cm in 05/2016 followed by Dr. [name]), pulm nodules being followed, mild-mod AS, depression, dyslipidemia, OSA intolerant of CPAP, DVT 2006, prostate enlargement s/p TURP, very remote smoker who presented to [facility] with  ischemic left leg requiring embolectomy in the setting of recently holding Coumadin due to hematuria.  1. LLE ischemia s/p embolectomy - per vasc surgery.  2. Chronic atrial fib with recent bleeding on Coumadin - at present time, urology has seen the patient and cleared him to resume anticoagulation (he restarted 06/30/16 but was not yet therapeutic at time of this event). Will need to follow for recurrent hematuria. His wife does not know what his INR was at the time of the hematuria. This is followed by PCP who is not in our record system. He appears rate controlled on the monitor. There brief dipping of HR to 40s at times but overall controlled in 60s. He is asymptomatic from cardiac standpoint. Continue to monitor.  Signed, Charlie Pitter PA-C 07/02/2016, 4:40 PM Pager: [phone]  History and all data above reviewed.  Patient examined.  I agree with the findings as above.  The patient exam reveals COR: Irregular  ,  Lungs: Clear  ,  Abd: Positive bowel sounds, no rebound no guarding, Ext No edema  .  All available labs, radiology testing, previous records reviewed. Agree with documented assessment and plan. Atrial fib:  Obviously needs to be back on anticoagulation given his acute thrombus.  I would suggest restarting warfarin with the potential for rapid reversal is the best option.  His wife tells me that it has not been difficult to regulate in the past.  We will obviously need close follow up given the hematuria.    Cesar [name]  5:51 PM  07/02/2016

## 2016-07-02 NOTE — ED Notes (Signed)
EMS reports original BP 198/108 and CBG 93

## 2016-07-02 NOTE — Consult Note (Signed)
New Urology Consult Note   Requesting Attending Physician:  [name], MD Service Providing Consult: Urology Consulting Attending: Junious Silk, MD  Assessment:  Patient is a 82 y.o. male with history of BPH (170g on Ct), AAA, aortic stenosis, a fib, depression, HLD, OSA admitted for left leg ischemia 2/2 left CFA occlusion after recent short trial off coumadin (5d) secondary to hematuria. He is now s/p left CFA embolization. Urology was consulted for history of BPH, hematuria and for recommendations on anticoagulation. Urine currently draining yellow.  Recommendations: 1. Would favor starting anticoagulation per primary team, especially in light of recent left CFA embolus. We will continue to monitor the degree, if any, of the patient's hematuria. The risks of withholding anticoagulation appear to outweigh the benefits, with the obvious potential for recurrence of hematuria 2. Trial of void when ambulating well 3. Continue home proscar  Thank you for this consult. Please contact the urology consult pager with any further questions/concerns. Sharmaine Base, MD Urology Surgical Resident   HPI -   Reason for Consult:  Gross hematuria, requirement for anticoagulant  Cesar [name] is seen in consultation for reasons noted above at the request of [name], MD.  This is a 82 yo patient with a history of AAA, aortic stenosis, a fib, depression, HLD, OSA, BPH. This is a patient of Dr. Diona Fanti of [facility]. He recently underwent cysto 06/19/16 showing intravesical clot burden which was evacuated revealing a friable prostate & bladder neck. Last seen 9/20 in the clinic with no significant hematuria where Foley was removed and he was restarted on coumadin. Over last week since being but back on coumadin no hematuria, dysuria, frequency. He has had stable nocturia 1-2 over that time.  No straining to urinate.  His urinary stream has been baseline.  No fevers, nausea, vomiting, back pain  or flank pain that has been new.  He presented to [facility] earlier today with left leg ischemia. He previously had his coumadin stopped about 5 days ago secondary to hematuria. CTA showed complete occlusion of his left CFA, now s/p embolectomy 07/02/16.   Urology was consulted for hematuria & recommendations on anticoagulation. He has a catheter in place postoperatively. Cr is 1.56 this morning - only other record on file is 1.3 from 2013. INR in ER is 1.54. CTA shows left renal 15cm cyst, bilateral renal atrophy, punctate nonobstructing right upper pole stone, no hydronephrosis bilaterally, unremarkable ureters/bladder, significant prostatic enlargement with CT findings suggestive of a ~170g prostate.  He is on proscar at home.    Past Medical History: Past Medical History:  Diagnosis Date  . AAA (abdominal aortic aneurysm) ([place])   . Aortic stenosis   . Atrial fibrillation ([place])   . CVA (cerebral infarction)   . Depression   . Dyslipidemia   . HOH (hard of hearing)   . Hypercholesterolemia   . OSA (obstructive sleep apnea)    intol to CPAP  . Prostate enlargement     Past Surgical History:  Past Surgical History:  Procedure Laterality Date  . NM MYOCAR PERF WALL MOTION  08/24/2002   markedly positive,subtle lateral ischemia  . PROSTATE SURGERY    . US ECHOCARDIOGRAPHY  01/06/2012   mod. AOV ca+,mild to mod. AS,mod mostly posterior MAC-mild MR    Medication: Current Facility-Administered Medications  Medication Dose Route Frequency Provider Last Rate Last Dose  . 0.9 %  sodium chloride infusion  500 mL Intravenous Once PRN Ulyses Amor, PA-C      .  0.9 %  sodium chloride infusion   Intravenous Continuous Ulyses Amor, PA-C 125 mL/hr at 07/02/16 1225    . acetaminophen (TYLENOL) tablet 325-650 mg  325-650 mg Oral Q4H PRN Ulyses Amor, PA-C       Or  . acetaminophen (TYLENOL) suppository 325-650 mg  325-650 mg Rectal Q4H PRN Ulyses Amor, PA-C      . alum & mag  hydroxide-simeth (MAALOX/MYLANTA) 200-200-20 MG/5ML suspension 15-30 mL  15-30 mL Oral Q2H PRN Ulyses Amor, PA-C      . bisacodyl (DULCOLAX) EC tablet 5 mg  5 mg Oral Daily PRN Ulyses Amor, PA-C      . cefUROXime (ZINACEF) 1.5 g in dextrose 5 % 50 mL IVPB  1.5 g Intravenous Q12H Ulyses Amor, PA-C      . Derrill Memo ON 07/03/2016] docusate sodium (COLACE) capsule 100 mg  100 mg Oral Daily Ulyses Amor, PA-C      . escitalopram (LEXAPRO) tablet 10 mg  10 mg Oral Daily Ulyses Amor, PA-C      . fentaNYL (SUBLIMAZE) 100 MCG/2ML injection           . finasteride (PROSCAR) tablet 5 mg  5 mg Oral Daily Emma M Collins, PA-C      . guaiFENesin-dextromethorphan (ROBITUSSIN DM) 100-10 MG/5ML syrup 15 mL  15 mL Oral Q4H PRN Ulyses Amor, PA-C      . heparin ADULT infusion 100 units/mL (25000 units/259mL sodium chloride 0.45%)  1,000 Units/hr Intravenous Continuous Kris Mouton, RPH 10 mL/hr at 07/02/16 1233 1,000 Units/hr at 07/02/16 1233  . hydrALAZINE (APRESOLINE) injection 5 mg  5 mg Intravenous Q20 Min PRN Ulyses Amor, PA-C      . iopamidol (ISOVUE-370) 76 % injection           . labetalol (NORMODYNE,TRANDATE) injection 10 mg  10 mg Intravenous Q10 min PRN Ulyses Amor, PA-C      . magnesium sulfate IVPB 2 g 50 mL  2 g Intravenous Daily PRN Ulyses Amor, PA-C      . metoprolol (LOPRESSOR) injection 2-5 mg  2-5 mg Intravenous Q2H PRN Ulyses Amor, PA-C      . morphine 2 MG/ML injection 2-5 mg  2-5 mg Intravenous Q1H PRN Ulyses Amor, PA-C   2 mg at 07/02/16 1225  . ondansetron (ZOFRAN) injection 4 mg  4 mg Intravenous Q6H PRN Ulyses Amor, PA-C      . oxyCODONE-acetaminophen (PERCOCET/ROXICET) 5-325 MG per tablet 1-2 tablet  1-2 tablet Oral Q4H PRN Ulyses Amor, PA-C      . pantoprazole (PROTONIX) EC tablet 40 mg  40 mg Oral Daily Emma M Collins, PA-C      . phenol (CHLORASEPTIC) mouth spray 1 spray  1 spray Mouth/Throat PRN Ulyses Amor, PA-C      . potassium chloride SA  (K-DUR,KLOR-CON) CR tablet 20-40 mEq  20-40 mEq Oral Daily PRN Ulyses Amor, PA-C      . senna-docusate (Senokot-S) tablet 1 tablet  1 tablet Oral QHS PRN Ulyses Amor, PA-C        Allergies: Allergies  Allergen Reactions  . Flomax [Tamsulosin Hcl] Other (See Comments)    Can't pee    Social History: Social History  Substance Use Topics  . Smoking status: Former Smoker    Packs/day: 1.00    Years: 10.00    Types: Cigarettes    Quit date: 10/05/1952  . Smokeless  tobacco: Former Systems developer    Quit date: 10/05/1976  . Alcohol use 0.0 oz/week    Family History Family History  Problem Relation Age of Onset  . Heart failure Mother   . Diabetes Father   . Stroke Father     Review of Systems 10 systems were reviewed and are negative except as noted specifically in the HPI.  Objective   Vital signs in last 24 hours: BP (!) 146/91   Pulse (!) 54   Temp 98.1 F (36.7 C)   Resp (!) 22   Ht 6\' 1"  (1.854 m)   Wt 212 lb 11.9 oz (96.5 kg)   SpO2 95%   BMI 28.07 kg/m   Intake/Output last 3 shifts: I/O last 3 completed shifts: In: -  Out: 325 [Urine:325]  Physical Exam General: NAD, A&O, resting, appropriate HEENT: [place]/AT, EOMI, MMM Pulmonary: Normal work of breathing on [place] Cardiovascular: irregularly irregular rhythm, adequate peripheral perfusion Abdomen: soft, NTTP, nondistended, no suprapubic fullness or tenderness GU: left inguinal dressing in place, Foley draining yellow urine, no CVA tenderness DRE: deferred Extremities: warm and well perfused  Most Recent Labs: Lab Results  Component Value Date   WBC 6.4 07/02/2016   HGB 12.3 (L) 07/02/2016   HCT 37.5 (L) 07/02/2016   PLT 160 07/02/2016    Lab Results  Component Value Date   NA 141 07/02/2016   K 3.9 07/02/2016   CL 110 07/02/2016   CO2 22 07/02/2016   BUN 13 07/02/2016   CREATININE 1.56 (H) 07/02/2016   CALCIUM 8.6 (L) 07/02/2016    Lab Results  Component Value Date   ALKPHOS 42 07/02/2016    BILITOT 0.6 07/02/2016   PROT 6.4 (L) 07/02/2016   ALBUMIN 3.2 (L) 07/02/2016   ALT 15 (L) 07/02/2016   AST 20 07/02/2016    Lab Results  Component Value Date   INR 1.54 07/02/2016   APTT 35 07/02/2016     Urine Culture: None   IMAGING: Ct Angio Ao+bifem W &/or Wo Contrast  Result Date: 07/02/2016 CLINICAL DATA:  82 year old male with left leg weakness and difficulty using left leg. EXAM: CT ANGIOGRAPHY OF ABDOMINAL AORTA WITH ILIOFEMORAL RUNOFF TECHNIQUE: Multidetector CT imaging of the abdomen, pelvis and lower extremities was performed using the standard protocol during bolus administration of intravenous contrast. Multiplanar CT image reconstructions and MIPs were obtained to evaluate the vascular anatomy. CONTRAST:  80 cc Isovue 370 COMPARISON:  CT of the abdomen pelvis dated 04/22/2015 FINDINGS: The visualized lung bases are clear.  Mild cardiomegaly. No intra-abdominal free air or free fluid. The liver, gallbladder, pancreas, spleen, and the adrenal glands appear unremarkable. Grossly stable appearing left renal inferior pole 15 cm cyst. Smaller left renal hypodense lesions are not well characterized but likely represent cysts. There is moderate bilateral renal parenchymal atrophy. Punctate nonobstructing right renal upper pole calculus versus vascular calcification. There is no hydronephrosis on either side. The visualized ureters and urinary bladder appear unremarkable. Mild enlargement of the prostate gland measuring 7 cm in greatest transverse axial dimension. Scattered sigmoid diverticula without active inflammatory changes. There is moderate stool throughout the colon. There is no evidence of bowel obstruction or active inflammation. Normal appendix. There is moderate aortoiliac atherosclerotic disease. There is no aneurysmal dilatation or dissection of the abdominal aorta. The origins of the celiac axis, SMA, IMA as well as the origins of the renal arteries remain patent. There is a  classic celiac axis branching anatomy. There is atherosclerotic calcification of the  common iliac arteries. No aneurysmal distention or dissection. LEFT LEG: There is complete occlusion of the distal left external iliac artery and common femoral artery. There is high-grade occlusion of the origin of the profundus femoris artery. There is clot with partial occlusion of the majority of the profundus femoris artery. There is high-grade narrowing of the left superficial femoral artery with very minimal flow in the proximal portion of the SFA. There is near complete occlusion of the midportion of the SFA. There is high-grade occlusion of the distal SFA with reconstitution of minimal flow. There is high-grade occlusion of the popliteal artery with minimal flow. There is advanced atherosclerotic disease of the calf arteries. No flow identified in the distal popliteal artery or in the calf arteries. No flow seen in the plantar artery or dorsalis pedis artery. RIGHT LEG: The right common femoral artery, deep and superficial femoral arteries, and the popliteal artery appear patent. There is atherosclerotic calcification of the anterior and posterior tibial arteries as well as the peroneal artery. There is diminished flow within the calf arteries of the right lower extremity. There is very diminished flow or absent flow in the anterior and posterior tibial arteries as well as the peroneal artery distally. No significant flow identified in the dorsalis pedis or plantar artery of the right foot on this CT. Evaluation however is limited due to extensive atherosclerotic calcification of the calf arteries and arteries of the foot. Correlation with clinical exam and duplex ultrasound recommended. IMPRESSION: LEFT LEG: Near complete occlusion of the distal external iliac, and common femoral artery on the left with extensive high-grade narrowing of the left superficial femoral artery and near complete occlusion of the midportion of  the SFA. High-grade narrowing of the distal SFA and popliteal artery with very diminished flow. Essentially no flow is identified in the left lower extremity below the knee. There is advanced atherosclerotic disease of the arteries of the calf. RIGHT LEG: Patent femoral and popliteal arteries. Advanced atherosclerotic calcification of the arteries of the calf and foot with diminished flow within this vessels. Minimal or absent flow in the arteries of the right lower extremity at and distal to the ankle. Evaluation however is somewhat limited due to advanced atherosclerotic disease. Correlation with clinical exam and duplex ultrasound recommended. No acute intra-abdominal or pelvic pathology identified. Moderate to advanced aortoiliac atherosclerotic disease. No aneurysmal dilatation or dissection. These results were called by telephone at the time of interpretation on 07/02/2016 at 6:23 am to Dr. Joseph Berkshire , who verbally acknowledged these results. Electronically Signed   By: Anner Crete M.D.   On: 07/02/2016 06:33

## 2016-07-02 NOTE — Anesthesia Preprocedure Evaluation (Signed)
Anesthesia Evaluation  Patient identified by MRN, date of birth, ID band Patient awake    Reviewed: Allergy & Precautions, NPO status , Patient's Chart, lab work & pertinent test results  Airway Mallampati: II       Dental  (+) Edentulous Upper   Pulmonary former smoker,    breath sounds clear to auscultation       Cardiovascular  Rhythm:Irregular Rate:Normal     Neuro/Psych    GI/Hepatic   Endo/Other    Renal/GU      Musculoskeletal   Abdominal   Peds  Hematology   Anesthesia Other Findings   Reproductive/Obstetrics                             Anesthesia Physical Anesthesia Plan  ASA: IV and emergent  Anesthesia Plan: General   Post-op Pain Management:    Induction: Intravenous  Airway Management Planned: Oral ETT  Additional Equipment:   Intra-op Plan:   Post-operative Plan: Extubation in OR  Informed Consent: I have reviewed the patients History and Physical, chart, labs and discussed the procedure including the risks, benefits and alternatives for the proposed anesthesia with the patient or authorized representative who has indicated his/her understanding and acceptance.     Plan Discussed with: CRNA and Anesthesiologist  Anesthesia Plan Comments:         Anesthesia Quick Evaluation

## 2016-07-02 NOTE — ED Provider Notes (Addendum)
[facility] DEPT Provider Note   CSN: [id number] Arrival date & time: 07/02/16  [id number]  By signing my name below, I, Johnney Killian, attest that this documentation has been prepared under the direction and in the presence of Orpah Greek, MD. Electronically Signed: Johnney Killian, ED Scribe. 07/02/16. 4:00 AM.   History   Chief Complaint Chief Complaint  Patient presents with  . Transient Ischemic Attack   HPI Comments: Cesar [name] is a 82 y.o. male who presents to the Emergency Department via EMS for severe pain in the left leg. Pt says he had no unusual symptoms when he went to sleep last night and then noticed pain in his leg and inability to ambulate when he got up in the middle of the night. Per EMS, pt had left leg weakness and his left leg felt cold to the touch with asymmetric pulses between the legs. Per EMS, BP was 198/108 when it was first measured. Per chart review, pt has atrial fibrillation. Per EMS, BP fell to 148/92, pedal pulses improved, grip strength returned, and symptoms generally resolved en route to the ED. Per EMS, pt received no medications prior to ED arrival.  In the ED, pt says his pain is much better. Per EMS, pt has history of DVT. Pt says he takes Coumadin. He denies headache. Per chart, pt has PMHx of lower extremity arthralgia.   The history is provided by the patient and the EMS personnel. No language interpreter was used.    Past Medical History:  Diagnosis Date  . AAA (abdominal aortic aneurysm) ([place])   . Aortic stenosis   . Atrial fibrillation ([place])   . CVA (cerebral infarction)   . Depression   . Dyslipidemia   . HOH (hard of hearing)   . Hypercholesterolemia   . OSA (obstructive sleep apnea)    intol to CPAP  . Prostate enlargement     Patient Active Problem List   Diagnosis Date Noted  . Claudication ([place]) 06/04/2016  . Long term current use of anticoagulant 06/04/2016  . Abnormal ECG 06/04/2016  . Thoracic ascending  aortic aneurysm ([place]) 04/22/2015  . Permanent atrial fibrillation ([place]) 05/11/2013  . Mild aortic stenosis 05/11/2013  . Orthostatic hypotension 05/11/2013  . Hyperlipidemia 05/11/2013    Past Surgical History:  Procedure Laterality Date  . NM MYOCAR PERF WALL MOTION  08/24/2002   markedly positive,subtle lateral ischemia  . PROSTATE SURGERY    . US ECHOCARDIOGRAPHY  01/06/2012   mod. AOV ca+,mild to mod. AS,mod mostly posterior MAC-mild MR       Home Medications    Prior to Admission medications   Medication Sig Start Date End Date Taking? Authorizing Provider  escitalopram (LEXAPRO) 10 MG tablet Take 10 mg by mouth daily.   Yes Historical Provider, MD  finasteride (PROSCAR) 5 MG tablet Take 5 mg by mouth daily.   Yes Historical Provider, MD  warfarin (COUMADIN) 5 MG tablet Take 7.5 mg by mouth daily.    Yes Historical Provider, MD    Family History Family History  Problem Relation Age of Onset  . Heart failure Mother   . Diabetes Father   . Stroke Father     Social History Social History  Substance Use Topics  . Smoking status: Former Smoker    Packs/day: 1.00    Years: 10.00    Types: Cigarettes    Quit date: 10/05/1952  . Smokeless tobacco: Former Systems developer    Quit date: 10/05/1976  . Alcohol use  0.0 oz/week     Allergies   Flomax [tamsulosin hcl]   Review of Systems Review of Systems  Musculoskeletal: Positive for gait problem.  Neurological: Negative for headaches.  All other systems reviewed and are negative.    Physical Exam Updated Vital Signs BP (!) 116/114 (BP Location: Right Arm)   Pulse 75   Temp 98.2 F (36.8 C) (Oral)   Resp 21   Ht 6' (1.829 m)   Wt 214 lb (97.1 kg)   SpO2 96%   BMI 29.02 kg/m   Physical Exam  Constitutional: He appears well-developed and well-nourished. No distress.  HENT:  Head: Normocephalic and atraumatic.  Right Ear: Hearing normal.  Left Ear: Hearing normal.  Nose: Nose normal.  Mouth/Throat: Oropharynx is  clear and moist and mucous membranes are normal.  Eyes: Conjunctivae and EOM are normal. Pupils are equal, round, and reactive to light.  Neck: Normal range of motion. Neck supple.  Cardiovascular: S1 normal and S2 normal.  Exam reveals no gallop and no friction rub.   No murmur heard. Irregularly irregular rhythm  Pulmonary/Chest: Effort normal and breath sounds normal. No respiratory distress. He exhibits no tenderness.  Abdominal: Soft. Normal appearance and bowel sounds are normal. There is no hepatosplenomegaly. There is no tenderness. There is no rebound, no guarding, no tenderness at McBurney's point and negative Murphy's sign. No hernia.  Musculoskeletal:  Left leg dusky from the knee down with cyanosis of the ipsilateral lower extremity nail beds No pulses palpable on the left leg  Neurological: He is alert. He has normal strength. No sensory deficit. GCS eye subscore is 4. GCS verbal subscore is 5. GCS motor subscore is 6.  Skin: Skin is warm, dry and intact. No rash noted. No cyanosis.  Psychiatric: He has a normal mood and affect. His speech is normal and behavior is normal. Thought content normal.  Nursing note and vitals reviewed.    ED Treatments / Results   DIAGNOSTIC STUDIES: Oxygen Saturation is 96% on RA, adequate by my interpretation.    COORDINATION OF CARE: 3:45 AM Discussed treatment plan with pt at bedside and pt agreed to plan.   Labs (all labs ordered are listed, but only abnormal results are displayed) Labs Reviewed  CBC WITH DIFFERENTIAL/PLATELET - Abnormal; Notable for the following:       Result Value   RBC 3.93 (*)    Hemoglobin 12.3 (*)    HCT 37.5 (*)    All other components within normal limits  COMPREHENSIVE METABOLIC PANEL - Abnormal; Notable for the following:    Glucose, Bld 104 (*)    Creatinine, Ser 1.56 (*)    Calcium 8.6 (*)    Total Protein 6.4 (*)    Albumin 3.2 (*)    ALT 15 (*)    GFR calc non Af Amer 39 (*)    GFR calc Af Amer  45 (*)    All other components within normal limits  PROTIME-INR - Abnormal; Notable for the following:    Prothrombin Time 18.7 (*)    All other components within normal limits  CK  APTT  HEPARIN LEVEL (UNFRACTIONATED)  I-STAT CG4 LACTIC ACID, ED    EKG  EKG Interpretation  Date/Time:  Thursday July 02 2016 03:40:30 EDT Ventricular Rate:  89 PR Interval:    QRS Duration: 107 QT Interval:  407 QTC Calculation: 496 R Axis:   17 Text Interpretation:  Atrial fibrillation Borderline prolonged QT interval Confirmed by Betsey Holiday  MD, CHRISTOPHER (  [id number]) on 07/02/2016 3:51:08 AM       Radiology No results found.  Procedures Procedures (including critical care time)  Medications Ordered in ED Medications  heparin ADULT infusion 100 units/mL (25000 units/294mL sodium chloride 0.45%) (not administered)     Initial Impression / Assessment and Plan / ED Course  I have reviewed the triage vital signs and the nursing notes.  Pertinent labs & imaging results that were available during my care of the patient were reviewed by me and considered in my medical decision making (see chart for details).  Clinical Course   Patient was brought to the emergency department from home by EMS. Patient reports that he awakened this morning and attempted to get out of bed to go to the bathroom, but felt severe pain and weakness in left leg. He was unable to walk because of these symptoms. He did not experience any left arm numbness or tingling, no facial droop or speech disturbance.  I will to the emergency department patient was noted to have a cool left foot. There was dusky and is of the lower leg with cyanosis of the nailbeds and plantar aspect of toes. I was unable to palpate or find dorsalis pedal or posterior tibialis pulses with bedside Doppler.  Patient does have a history of atrial fibrillation and is in chronic atrial fibrillation. He takes Coumadin, but his Coumadin was held for 5 days  last week because of hematuria. INR is subtherapeutic. Patient initiated on IV heparin.  Discussed with Dr. Donnetta Hutching, agrees with initiation of heparin. Asks for CT angiography and will see the patient in ER.  CHA2DS2-VASc Score for Atrial Fibrillation Stroke Risk from [email or web address]  on 07/03/2016 ** All calculations should be rechecked by clinician prior to use **  RESULT SUMMARY: 5 points Stroke risk was 7.2% per year in >90,000 patients (the [place] Atrial Fibrillation Cohort Study) and 10.0% risk of stroke/TIA/systemic embolism.  One recommendation suggests a 0 score is "low" risk and may not require anticoagulation; a 1 score is "low-moderate" risk and should consider antiplatelet or anticoagulation, and score 2 or greater is "moderate-high" risk and should otherwise be an anticoagulation candidate.   INPUTS: Age -> 2 = =77 Sex -> 0 = Male <abbr title='Congestive heart failure'>CHF</abbr> history -> 0 = No Hypertension history -> 0 = No Stroke/TIA/Thromboembolism history -> 2 = Yes Vascular disease history -> 1 = Yes Diabetes history -> 0 = No   CRITICAL CARE Performed by: Orpah Greek.   Total critical care time: 30 minutes  Critical care time was exclusive of separately billable procedures and treating other patients.  Critical care was necessary to treat or prevent imminent or life-threatening deterioration.  Critical care was time spent personally by me on the following activities: development of treatment plan with patient and/or surrogate as well as nursing, discussions with consultants, evaluation of patient's response to treatment, examination of patient, obtaining history from patient or surrogate, ordering and performing treatments and interventions, ordering and review of laboratory studies, ordering and review of radiographic studies, pulse oximetry and re-evaluation of patient's condition.   Final Clinical Impressions(s) / ED Diagnoses   Final diagnoses:    Arterial embolism of leg (HCC)    New Prescriptions New Prescriptions   No medications on file   I personally performed the services described in this documentation, which was scribed in my presence. The recorded information has been reviewed and is accurate.       Orpah Greek, MD 07/02/16 [phone]  Orpah Greek, MD 07/03/16 [phone]

## 2016-07-02 NOTE — Care Management Note (Signed)
Case Management Note  Patient Details  Name: TELFORD GIRDNER MRN: [id number] Date of Birth: 1929-03-28  Subjective/Objective:   S/p  Left femoral exploration with embolectomy of external iliac, patient lives with wife, he has a single cane at home, and a tub bench.  PTA indep, he has medication coverage , he has PCP, and he has transportation.  NCM will cont to follow for dc needs.                 Action/Plan:   Expected Discharge Date:                  Expected Discharge Plan:  [place]  In-House Referral:     Discharge planning Services  CM Consult  Post Acute Care Choice:    Choice offered to:     DME Arranged:    DME Agency:     HH Arranged:    [place] Agency:     Status of Service:  In process, will continue to follow  If discussed at Long Length of Stay Meetings, dates discussed:    Additional Comments:  Zenon Mayo, RN 07/02/2016, 2:46 PM

## 2016-07-02 NOTE — Progress Notes (Signed)
[place] for heparin  Indication: atrial fibrillation  Allergies  Allergen Reactions  . Flomax [Tamsulosin Hcl] Other (See Comments)    Can't pee    Patient Measurements: Height: 6\' 1"  (185.4 cm) Weight: 212 lb 11.9 oz (96.5 kg) IBW/kg (Calculated) : 79.9 Heparin Dosing Weight: 96.5kg  Vital Signs: Temp: 98 F (36.7 C) (09/28 1000) Temp Source: Oral (09/28 0354) BP: 146/91 (09/28 1144) Pulse Rate: 54 (09/28 1144)  Labs:  Recent Labs  07/02/16 0350  HGB 12.3*  HCT 37.5*  PLT 160  APTT 35  LABPROT 18.7*  INR 1.54  CREATININE 1.56*  CKTOTAL 133    Estimated Creatinine Clearance: 42.4 mL/min (by C-G formula based on SCr of 1.56 mg/dL (H)).   Medical History: Past Medical History:  Diagnosis Date  . AAA (abdominal aortic aneurysm) ([place])   . Aortic stenosis   . Atrial fibrillation ([place])   . CVA (cerebral infarction)   . Depression   . Dyslipidemia   . HOH (hard of hearing)   . Hypercholesterolemia   . OSA (obstructive sleep apnea)    intol to CPAP  . Prostate enlargement     Medications:  Scheduled:  . cefUROXime (ZINACEF)  IV  1.5 g Intravenous Q12H  . [START ON 07/03/2016] docusate sodium  100 mg Oral Daily  . escitalopram  10 mg Oral Daily  . fentaNYL      . finasteride  5 mg Oral Daily  . iopamidol      . pantoprazole  40 mg Oral Daily    Assessment: 82 y/o M on warfarin PTA for afib and was on heparin for ischemic limb and this was stopped for left lower extremity embolectomy on 9/28. Pharmacy consulted to restart heparin. -INR= 1.56 -Heparin infusion was previously at 1300 units/hr (no levels could be obtained prior to OR)  Goal of Therapy:  Heparin level 0.3-0.7 units/ml Monitor platelets by anticoagulation protocol: Yes   Plan:  -No heparin bolus -Will begin heparin at a lower rate of 1000 units/hr as patient is s/p OR -Heparin level in 8 hours and daily wth CBC daily  Hildred Laser, Pharm D  07/02/2016 12:04 PM

## 2016-07-02 NOTE — Progress Notes (Signed)
[place] for heparin  Indication: atrial fibrillation  Allergies  Allergen Reactions  . Flomax [Tamsulosin Hcl] Other (See Comments)    Can't pee    Patient Measurements: Height: 6\' 1"  (185.4 cm) Weight: 212 lb 11.9 oz (96.5 kg) IBW/kg (Calculated) : 79.9 Heparin Dosing Weight: 96.5kg  Vital Signs: Temp: 97.9 F (36.6 C) (09/28 1925) Temp Source: Oral (09/28 1925) BP: 111/61 (09/28 1925) Pulse Rate: 35 (09/28 1925)  Labs:  Recent Labs  07/02/16 0350 07/02/16 2128  HGB 12.3*  --   HCT 37.5*  --   PLT 160  --   APTT 35  --   LABPROT 18.7*  --   INR 1.54  --   HEPARINUNFRC  --  0.58  CREATININE 1.56*  --   CKTOTAL 133  --     Estimated Creatinine Clearance: 42.4 mL/min (by C-G formula based on SCr of 1.56 mg/dL (H)).   Medical History: Past Medical History:  Diagnosis Date  . Aortic stenosis    a. mild-mod by echo 06/2016.  . Ascending aortic aneurysm ([place])   . CKD (chronic kidney disease), stage III    baseline Cr 1.4-1.5 in 2016-2017 by PCP)  . CVA (cerebral infarction)   . Depression   . DVT (deep venous thrombosis) ([place]) 2006  . Dyslipidemia   . Hematuria   . HOH (hard of hearing)   . Hypercholesterolemia   . OSA (obstructive sleep apnea)    intol to CPAP  . Permanent atrial fibrillation ([place])   . Prostate enlargement   . Pulmonary nodules     Medications:  Scheduled:  . cefUROXime (ZINACEF)  IV  1.5 g Intravenous Q12H  . [START ON 07/03/2016] docusate sodium  100 mg Oral Daily  . escitalopram  10 mg Oral Daily  . finasteride  5 mg Oral Daily  . pantoprazole  40 mg Oral Daily   . sodium chloride 125 mL/hr at 07/02/16 1225  . heparin 1,000 Units/hr (07/02/16 1233)     Assessment: 82 y/o M on warfarin PTA for afib and was on heparin for ischemic limb and this was stopped for left lower extremity embolectomy on 9/28. Pharmacy consulted to restart heparin. -INR= 1.56 -Heparin infusion was previously at  1300 units/hr (no levels could be obtained prior to OR)  PM f/u heparin level therapeutic on heparin at 1000 units/hr.    Goal of Therapy:  Heparin level 0.3-0.7 units/ml Monitor platelets by anticoagulation protocol: Yes   Plan:  -Continue IV heparin at current rate. -Confirm level with AM labs. -Daily heparin level and CBC.  Uvaldo Rising, BCPS  Clinical Pharmacist Pager [phone]  07/02/2016 10:15 PM

## 2016-07-02 NOTE — ED Notes (Signed)
Dr Early in to talk with patient and family

## 2016-07-02 NOTE — Anesthesia Procedure Notes (Signed)
Procedure Name: Intubation Date/Time: 07/02/2016 6:33 AM Performed by: Valetta Fuller Pre-anesthesia Checklist: Patient identified, Emergency Drugs available, Suction available and Patient being monitored Patient Re-evaluated:Patient Re-evaluated prior to inductionOxygen Delivery Method: Circle system utilized Preoxygenation: Pre-oxygenation with 100% oxygen Intubation Type: IV induction, Rapid sequence and Cricoid Pressure applied Laryngoscope Size: Miller and 2 Grade View: Grade I Tube type: Oral Tube size: 7.5 mm Number of attempts: 1 Airway Equipment and Method: Stylet Placement Confirmation: ETT inserted through vocal cords under direct vision,  positive ETCO2 and breath sounds checked- equal and bilateral Secured at: 23 cm Tube secured with: Tape Dental Injury: Teeth and Oropharynx as per pre-operative assessment

## 2016-07-02 NOTE — Op Note (Addendum)
    OPERATIVE REPORT  DATE OF SURGERY: 07/02/2016  PATIENT: Cesar [name], 82 y.o. male MRN: [id number]  DOB: December 09, 1928  PRE-OPERATIVE DIAGNOSIS: Acute ischemia left lower extremity presumed cardiogenic embolus  POST-OPERATIVE DIAGNOSIS:  Same  PROCEDURE: Left femoral exploration with embolectomy of external iliac, common femoral, profundus femoral artery  SURGEON:  Curt Jews, M.D.  PHYSICIAN ASSISTANT: Gerri Lins PA-C  ANESTHESIA:  Gen.  EBL: Minimal ml  Total I/O In: 700 [I.V.:700] Out: 20 [Blood:20]  BLOOD ADMINISTERED: None  DRAINS: None  SPECIMEN: Embolus from femoral artery  COUNTS CORRECT:  YES  PLAN OF CARE: PACU stable   PATIENT DISPOSITION:  PACU - hemodynamically stable  PROCEDURE DETAILS: The patient was taken to the operative placed supine position where the area of the left groin left leg prepped in usual sterile fashion. An incision was made over the femoral artery. The femoral artery had no pulse. The artery was exposed up under the inguinal ligament. The superficial femoral and deep femoral arteries were encircled with vessel loops for control. The patient had a heparin drip initiated in the emergency department. He had an initial bolus of 5000 units of intravenous heparin. The common femoral artery was opened transversely above the femoral bifurcation. There was organized thrombus present. This was removed with excellent inflow from the iliac. A 4 Fogarty catheter was passed to the level of the aorta and was removed with no further thrombus removed. This was a reoccluded. The Fogarty catheter was then positioned down the superficial femoral artery and was easily passed down to the level of the foot. No thrombus was removed from the superficial femoral artery. There was excellent backbleeding. The Fogarty catheter was then passed down the deep femoral artery and there was organized thrombus removed from the origin of the deep femoral artery with no  further thrombus removed and excellent backbleeding. The incision and the femoral artery was closed with a running 5-0 Prolene suture. Clamps were removed and a good pulse was noted at the knee and good Doppler flow was noted at the dorsalis pedis and posterior tibial level. The patient's heparin was not reversed. The wounds were irrigated with saline and hemostasis tablet cautery. Wounds were closed with 2-0 Vicryl in several layers and subcutaneous tissue. The skin was closed with a 3-0 subcuticular Vicryl suture. Sterile dressing was applied and the patient was transferred to the recovery room in stable condition. Cardiology will be consulted due to presumed cardiogenic source for this embolus   [name], M.D., [facility] 07/02/2016 8:28 AM

## 2016-07-02 NOTE — Progress Notes (Signed)
ANTICOAGULATION CONSULT NOTE - Initial Consult  Pharmacy Consult for Heparin  Indication: Ischemic limb/Afib  Allergies  Allergen Reactions  . Flomax [Tamsulosin Hcl] Other (See Comments)    Can't pee   Patient Measurements: Height: 6' (182.9 cm) Weight: 214 lb (97.1 kg) IBW/kg (Calculated) : 77.6 Vital Signs: Temp: 98.2 F (36.8 C) (09/28 0354) Temp Source: Oral (09/28 0354) BP: 116/114 (09/28 0354) Pulse Rate: 75 (09/28 0354)  Labs:  Recent Labs  07/02/16 0350  HGB 12.3*  HCT 37.5*  PLT 160  APTT 35  LABPROT 18.7*  INR 1.54  CREATININE 1.56*  CKTOTAL 133    Estimated Creatinine Clearance: 41.8 mL/min (by C-G formula based on SCr of 1.56 mg/dL (H)).   Medical History: Past Medical History:  Diagnosis Date  . AAA (abdominal aortic aneurysm) ([place])   . Aortic stenosis   . Atrial fibrillation ([place])   . CVA (cerebral infarction)   . Depression   . Dyslipidemia   . HOH (hard of hearing)   . Hypercholesterolemia   . OSA (obstructive sleep apnea)    intol to CPAP  . Prostate enlargement     Assessment: 82 y/o M on warfarin PTA for afib, now starting heparin per rx for possible ischemic limb, was off warfarin from 9/16-9/21 for procedure, INR is sub-therapeutic at 1.54, Hgb 12.3, noted bump in SCr  Goal of Therapy:  Heparin level 0.3-0.7 units/ml Monitor platelets by anticoagulation protocol: Yes   Plan:  -Start heparin drip at 1300 units/hr -1300 HL -Daily CBC/HL -Monitor for bleeding  [name] 07/02/2016,4:33 AM

## 2016-07-02 NOTE — ED Notes (Signed)
Wife reports he went off Coumadin 9/16 for 5 days due to bleeding and blood clots from the bladder.  Restarted 9/21

## 2016-07-02 NOTE — ED Triage Notes (Signed)
EMS reports he woke up approx 230a and family noticed slight left facial droop and "different feeling" in the left leg   EMS reported the symptoms resolved by the time they arrived to the ED.  Upon removing his clothing noticed his left foot to be cooler than the right with difficulty identifying any pulses in the left foot  Temperature change noted from knees down

## 2016-07-02 NOTE — ED Notes (Signed)
Wife stated she DID NOT bring his glasses or hearing aids

## 2016-07-02 NOTE — H&P (Signed)
Patient name: Cesar [name] MRN: [id number] DOB: 08-10-1929 Sex: male  REASON FOR CONSULT: Left leg ischemia  HPI: Cesar [name] is a 82 y.o. male, sensitive emergency department with left leg ischemia. He has history of chronic atrial fibrillation. Had complications of hematuria and therefore had his Coumadin stopped approximately 5 days ago. Went to bed that without difficulty last night and awoke at approximately 2:00 unable to stand due to weakness and pain in his left leg and was brought to the emergency department by EMS. He has no prior history of peripheral vascular occlusive disease. No prior history of stroke. Does have a history of chronic atrial fibrillation and aortic stenosis. Also has a history of ascending thoracic aneurysm being followed by T CTS. He is extremely hard of hearing and most history is from his wife and son present. He has no prior history of embolic complication  Past Medical History:  Diagnosis Date  . AAA (abdominal aortic aneurysm) ([place])   . Aortic stenosis   . Atrial fibrillation ([place])   . CVA (cerebral infarction)   . Depression   . Dyslipidemia   . HOH (hard of hearing)   . Hypercholesterolemia   . OSA (obstructive sleep apnea)    intol to CPAP  . Prostate enlargement     Family History  Problem Relation Age of Onset  . Heart failure Mother   . Diabetes Father   . Stroke Father     SOCIAL HISTORY: Social History   Social History  . Marital status: Married    Spouse name: N/A  . Number of children: 2  . Years of education: N/A   Occupational History  . Not on file.   Social History Main Topics  . Smoking status: Former Smoker    Packs/day: 1.00    Years: 10.00    Types: Cigarettes    Quit date: 10/05/1952  . Smokeless tobacco: Former Systems developer    Quit date: 10/05/1976  . Alcohol use 0.0 oz/week  . Drug use: No  . Sexual activity: No   Other Topics Concern  . Not on file   Social History Narrative  . No narrative on file     Allergies  Allergen Reactions  . Flomax [Tamsulosin Hcl] Other (See Comments)    Can't pee    Current Facility-Administered Medications  Medication Dose Route Frequency Provider Last Rate Last Dose  . heparin ADULT infusion 100 units/mL (25000 units/248mL sodium chloride 0.45%)  1,300 Units/hr Intravenous Continuous Erenest Blank, RPH 13 mL/hr at 07/02/16 0515 1,300 Units/hr at 07/02/16 0515  . iopamidol (ISOVUE-370) 76 % injection             REVIEW OF SYSTEMS:  [X]  denotes positive finding, [ ]  denotes negative finding Cardiac  Comments:  Chest pain or chest pressure:    Shortness of breath upon exertion:    Short of breath when lying flat:    Irregular heart rhythm: x       Vascular    Pain in calf, thigh, or hip brought on by ambulation:    Pain in feet at night that wakes you up from your sleep:     Blood clot in your veins:    Leg swelling:         Pulmonary    Oxygen at home:    Productive cough:     Wheezing:         Neurologic    Sudden weakness in arms or legs:  Sudden numbness in arms or legs:     Sudden onset of difficulty speaking or slurred speech:    Temporary loss of vision in one eye:     Problems with dizziness:         Gastrointestinal    Blood in stool:     Vomited blood:         Genitourinary    Burning when urinating:     Blood in urine: x       Psychiatric    Major depression:         Hematologic    Bleeding problems:    Problems with blood clotting too easily:        Skin    Rashes or ulcers:        Constitutional    Fever or chills:      PHYSICAL EXAM: Vitals:   07/02/16 0430 07/02/16 0445 07/02/16 0500 07/02/16 0515  BP: 138/77 139/91 131/79 134/80  Pulse: 87 86 76 (!) 59  Resp: 22 20 18 21   Temp:      TempSrc:      SpO2: 91% 95% 95% 96%  Weight:      Height:        GENERAL: The patient is a well-nourished male, in no acute distress. The vital signs are documented above. CARDIAC: Irregular VASCULAR: 2+  radial pulses bilaterally. 2+ right femoral pulse 2+ right popliteal and 2+ right dorsalis pedis pulse. Absent femoral and distal pulses on the left PULMONARY: There is good air exchange  ABDOMEN: Soft and non-tender  MUSCULOSKELETAL: There are no major deformities or cyanosis. NEUROLOGIC: Can move his left foot but has had diminished sensation. Normal on the right SKIN: There are no ulcers or rashes noted. PSYCHIATRIC: The patient has a normal affect.  DATA:   CT angiogram shows complete occlusion of his common femoral artery. There is reconstitution of his deep femoral. Either occlusion or poor flow in his superficial femoral artery on the left. Normal flow on the right. Severe calcification of his tibial vessels on the left  MEDICAL ISSUES:  Acute ischemia related to embolic disease in the left foot. Will take emergently to the operating room for embolectomy and possible bypass. Discussed with the patient is wife and son present to understand.   Curt Jews [facility] [employer] [facility] [phone]

## 2016-07-02 NOTE — Anesthesia Postprocedure Evaluation (Signed)
Anesthesia Post Note  Patient: Cesar [name]  Procedure(s) Performed: Procedure(s) (LRB): EMBOLECTOMY LEFT FEMORAL ARTERY (Left)  Patient location during evaluation: PACU Anesthesia Type: General Level of consciousness: awake and alert, oriented and patient cooperative Pain management: pain level controlled Vital Signs Assessment: post-procedure vital signs reviewed and stable Respiratory status: spontaneous breathing, nonlabored ventilation, respiratory function stable and patient connected to nasal cannula oxygen Cardiovascular status: blood pressure returned to baseline and stable Postop Assessment: no signs of nausea or vomiting Anesthetic complications: no    Last Vitals:  Vitals:   07/02/16 0830 07/02/16 0845  BP: (!) 160/83 (!) 158/94  Pulse: 74 (!) 112  Resp: 19 17  Temp:      Last Pain:  Vitals:   07/02/16 0830  TempSrc:   PainSc: 4                  Tylar Amborn,E. Sharan Mcenaney

## 2016-07-02 NOTE — Transfer of Care (Signed)
Immediate Anesthesia Transfer of Care Note  Patient: Cesar [name]  Procedure(s) Performed: Procedure(s): EMBOLECTOMY LEFT FEMORAL ARTERY (Left)  Patient Location: PACU  Anesthesia Type:General  Level of Consciousness: awake and alert   Airway & Oxygen Therapy: Patient Spontanous Breathing and Patient connected to nasal cannula oxygen  Post-op Assessment: Report given to RN, Post -op Vital signs reviewed and stable and Patient moving all extremities X 4  Post vital signs: Reviewed and stable  Last Vitals:  Vitals:   07/02/16 0500 07/02/16 0515  BP: 131/79 134/80  Pulse: 76 (!) 59  Resp: 18 21  Temp:      Last Pain:  Vitals:   07/02/16 0354  TempSrc: Oral         Complications: No apparent anesthesia complications

## 2016-07-03 ENCOUNTER — Encounter (HOSPITAL_COMMUNITY): Payer: Self-pay | Admitting: Vascular Surgery

## 2016-07-03 DIAGNOSIS — I749 Embolism and thrombosis of unspecified artery: Secondary | ICD-10-CM

## 2016-07-03 DIAGNOSIS — I480 Paroxysmal atrial fibrillation: Secondary | ICD-10-CM

## 2016-07-03 DIAGNOSIS — N183 Chronic kidney disease, stage 3 (moderate): Secondary | ICD-10-CM

## 2016-07-03 LAB — CBC
HEMATOCRIT: 35.5 % — AB (ref 39.0–52.0)
HEMOGLOBIN: 11.1 g/dL — AB (ref 13.0–17.0)
MCH: 30.8 pg (ref 26.0–34.0)
MCHC: 31.3 g/dL (ref 30.0–36.0)
MCV: 98.6 fL (ref 78.0–100.0)
Platelets: 167 10*3/uL (ref 150–400)
RBC: 3.6 MIL/uL — ABNORMAL LOW (ref 4.22–5.81)
RDW: 13.5 % (ref 11.5–15.5)
WBC: 6.5 10*3/uL (ref 4.0–10.5)

## 2016-07-03 LAB — BASIC METABOLIC PANEL
ANION GAP: 7 (ref 5–15)
BUN: 14 mg/dL (ref 6–20)
CHLORIDE: 107 mmol/L (ref 101–111)
CO2: 24 mmol/L (ref 22–32)
Calcium: 8 mg/dL — ABNORMAL LOW (ref 8.9–10.3)
Creatinine, Ser: 1.57 mg/dL — ABNORMAL HIGH (ref 0.61–1.24)
GFR, EST AFRICAN AMERICAN: 45 mL/min — AB (ref >60)
GFR, EST NON AFRICAN AMERICAN: 39 mL/min — AB (ref >60)
Glucose, Bld: 102 mg/dL — ABNORMAL HIGH (ref 65–99)
Potassium: 4.2 mmol/L (ref 3.5–5.1)
Sodium: 138 mmol/L (ref 135–145)

## 2016-07-03 LAB — HEPARIN LEVEL (UNFRACTIONATED): HEPARIN UNFRACTIONATED: 0.4 [IU]/mL (ref 0.30–0.70)

## 2016-07-03 MED ORDER — SODIUM CHLORIDE 0.9 % IV SOLN: INTRAVENOUS | Status: DC

## 2016-07-03 MED ORDER — WARFARIN - PHARMACIST DOSING INPATIENT
Freq: Every day | Status: DC
  Administered 2016-07-03: 18:00:00

## 2016-07-03 MED ORDER — WARFARIN SODIUM 5 MG PO TABS
10.0000 mg | ORAL_TABLET | Freq: Once | ORAL | Status: AC
  Administered 2016-07-03: 10 mg via ORAL
  Filled 2016-07-03: qty 2

## 2016-07-03 MED ORDER — METOPROLOL TARTRATE 12.5 MG HALF TABLET
12.5000 mg | ORAL_TABLET | Freq: Two times a day (BID) | ORAL | Status: DC
  Administered 2016-07-03 – 2016-07-05 (×6): 12.5 mg via ORAL
  Filled 2016-07-03 (×6): qty 1

## 2016-07-03 NOTE — Care Management Note (Addendum)
Case Management Note  Patient Details  Name: Cesar [name] MRN: [id number] Date of Birth: Apr 22, 1929  Subjective/Objective:  S/p left femoral expoloration with embolectomy of external iliac, per pt eval rec snf, CSW aware.  Per CSW she spoke with patient regarding snf, daughter wants him to go to snf and patient wants [facility] but will let CSW fax information out.  NCM will cont to follow for dc needs.               Action/Plan:   Expected Discharge Date:                  Expected Discharge Plan:  Skilled Nursing Facility  In-House Referral:  Clinical Social Work  Discharge planning Services  CM Consult  Post Acute Care Choice:    Choice offered to:     DME Arranged:    DME Agency:     HH Arranged:    [place] Agency:     Status of Service:  In process, will continue to follow  If discussed at Long Length of Stay Meetings, dates discussed:    Additional Comments:  Zenon Mayo, RN 07/03/2016, 2:32 PM

## 2016-07-03 NOTE — Progress Notes (Addendum)
DAILY PROGRESS NOTE  Subjective:  Remains in a-fib with RVR today - HR in 120-130 range. Started on warfarin yesterday.  Objective:  Temp:  [97.6 F (36.4 C)-99 F (37.2 C)] 99 F (37.2 C) (09/29 1155) Pulse Rate:  [35-106] 106 (09/29 1155) Resp:  [14-26] 20 (09/29 1155) BP: (108-147)/(60-89) 108/77 (09/29 1155) SpO2:  [92 %-100 %] 95 % (09/29 1155) Weight change: -1 lb 4.1 oz (-0.57 kg)  Intake/Output from previous day: 09/28 0701 - 09/29 0700 In: 1792.4 [P.O.:360; I.V.:1382.4; IV Piggyback:50] Out: 420 [Urine:400; Blood:20]  Intake/Output from this shift: Total I/O In: 2000 [I.V.:2000] Out: -   Medications: No current facility-administered medications on file prior to encounter.    Current Outpatient Prescriptions on File Prior to Encounter  Medication Sig Dispense Refill  . escitalopram (LEXAPRO) 10 MG tablet Take 10 mg by mouth daily.    . finasteride (PROSCAR) 5 MG tablet Take 5 mg by mouth daily.    Marland Kitchen warfarin (COUMADIN) 5 MG tablet Take 7.5 mg by mouth daily.       Physical Exam: General appearance: alert and no distress Lungs: clear to auscultation bilaterally Heart: irregularly irregular rhythm Extremities: post-surgical, trace edema Neurologic: Grossly normal  Lab Results: Results for orders placed or performed during the hospital encounter of 07/02/16 (from the past 48 hour(s))  CBC with Differential/Platelet     Status: Abnormal   Collection Time: 07/02/16  3:50 AM  Result Value Ref Range   WBC 6.4 4.0 - 10.5 K/uL   RBC 3.93 (L) 4.22 - 5.81 MIL/uL   Hemoglobin 12.3 (L) 13.0 - 17.0 g/dL   HCT 37.5 (L) 39.0 - 52.0 %   MCV 95.4 78.0 - 100.0 fL   MCH 31.3 26.0 - 34.0 pg   MCHC 32.8 30.0 - 36.0 g/dL   RDW 13.2 11.5 - 15.5 %   Platelets 160 150 - 400 K/uL   Neutrophils Relative % 72 %   Neutro Abs 4.7 1.7 - 7.7 K/uL   Lymphocytes Relative 13 %   Lymphs Abs 0.8 0.7 - 4.0 K/uL   Monocytes Relative 7 %   Monocytes Absolute 0.5 0.1 - 1.0 K/uL    Eosinophils Relative 7 %   Eosinophils Absolute 0.4 0.0 - 0.7 K/uL   Basophils Relative 1 %   Basophils Absolute 0.0 0.0 - 0.1 K/uL  Comprehensive metabolic panel     Status: Abnormal   Collection Time: 07/02/16  3:50 AM  Result Value Ref Range   Sodium 141 135 - 145 mmol/L   Potassium 3.9 3.5 - 5.1 mmol/L   Chloride 110 101 - 111 mmol/L   CO2 22 22 - 32 mmol/L   Glucose, Bld 104 (H) 65 - 99 mg/dL   BUN 13 6 - 20 mg/dL   Creatinine, Ser 1.56 (H) 0.61 - 1.24 mg/dL   Calcium 8.6 (L) 8.9 - 10.3 mg/dL   Total Protein 6.4 (L) 6.5 - 8.1 g/dL   Albumin 3.2 (L) 3.5 - 5.0 g/dL   AST 20 15 - 41 U/L   ALT 15 (L) 17 - 63 U/L   Alkaline Phosphatase 42 38 - 126 U/L   Total Bilirubin 0.6 0.3 - 1.2 mg/dL   GFR calc non Af Amer 39 (L) >60 mL/min   GFR calc Af Amer 45 (L) >60 mL/min    Comment: (NOTE) The eGFR has been calculated using the CKD EPI equation. This calculation has not been validated in all clinical situations. eGFR's persistently <60 mL/min  signify possible Chronic Kidney Disease.    Anion gap 9 5 - 15  CK     Status: None   Collection Time: 07/02/16  3:50 AM  Result Value Ref Range   Total CK 133 49 - 397 U/L  Protime-INR     Status: Abnormal   Collection Time: 07/02/16  3:50 AM  Result Value Ref Range   Prothrombin Time 18.7 (H) 11.4 - 15.2 seconds   INR 1.54   APTT     Status: None   Collection Time: 07/02/16  3:50 AM  Result Value Ref Range   aPTT 35 24 - 36 seconds  I-Stat CG4 Lactic Acid, ED     Status: None   Collection Time: 07/02/16  3:57 AM  Result Value Ref Range   Lactic Acid, Venous 1.75 0.5 - 1.9 mmol/L  MRSA PCR Screening     Status: None   Collection Time: 07/02/16  3:33 PM  Result Value Ref Range   MRSA by PCR NEGATIVE NEGATIVE    Comment:        The GeneXpert MRSA Assay (FDA approved for NASAL specimens only), is one component of a comprehensive MRSA colonization surveillance program. It is not intended to diagnose MRSA infection nor to guide  or monitor treatment for MRSA infections.   Heparin level (unfractionated)     Status: None   Collection Time: 07/02/16  9:28 PM  Result Value Ref Range   Heparin Unfractionated 0.58 0.30 - 0.70 IU/mL    Comment:        IF HEPARIN RESULTS ARE BELOW EXPECTED VALUES, AND PATIENT DOSAGE HAS BEEN CONFIRMED, SUGGEST FOLLOW UP TESTING OF ANTITHROMBIN III LEVELS.   CBC     Status: Abnormal   Collection Time: 07/03/16  2:27 AM  Result Value Ref Range   WBC 6.5 4.0 - 10.5 K/uL   RBC 3.60 (L) 4.22 - 5.81 MIL/uL   Hemoglobin 11.1 (L) 13.0 - 17.0 g/dL   HCT 35.5 (L) 39.0 - 52.0 %   MCV 98.6 78.0 - 100.0 fL   MCH 30.8 26.0 - 34.0 pg   MCHC 31.3 30.0 - 36.0 g/dL   RDW 13.5 11.5 - 15.5 %   Platelets 167 150 - 400 K/uL  Basic metabolic panel     Status: Abnormal   Collection Time: 07/03/16  2:27 AM  Result Value Ref Range   Sodium 138 135 - 145 mmol/L   Potassium 4.2 3.5 - 5.1 mmol/L   Chloride 107 101 - 111 mmol/L   CO2 24 22 - 32 mmol/L   Glucose, Bld 102 (H) 65 - 99 mg/dL   BUN 14 6 - 20 mg/dL   Creatinine, Ser 1.57 (H) 0.61 - 1.24 mg/dL   Calcium 8.0 (L) 8.9 - 10.3 mg/dL   GFR calc non Af Amer 39 (L) >60 mL/min   GFR calc Af Amer 45 (L) >60 mL/min    Comment: (NOTE) The eGFR has been calculated using the CKD EPI equation. This calculation has not been validated in all clinical situations. eGFR's persistently <60 mL/min signify possible Chronic Kidney Disease.    Anion gap 7 5 - 15  Heparin level (unfractionated)     Status: None   Collection Time: 07/03/16  2:27 AM  Result Value Ref Range   Heparin Unfractionated 0.40 0.30 - 0.70 IU/mL    Comment:        IF HEPARIN RESULTS ARE BELOW EXPECTED VALUES, AND PATIENT DOSAGE HAS BEEN CONFIRMED, SUGGEST FOLLOW UP  TESTING OF ANTITHROMBIN III LEVELS.     Imaging: Ct Angio Ao+bifem W &/or Wo Contrast  Result Date: 07/02/2016 CLINICAL DATA:  82 year old male with left leg weakness and difficulty using left leg. EXAM: CT  ANGIOGRAPHY OF ABDOMINAL AORTA WITH ILIOFEMORAL RUNOFF TECHNIQUE: Multidetector CT imaging of the abdomen, pelvis and lower extremities was performed using the standard protocol during bolus administration of intravenous contrast. Multiplanar CT image reconstructions and MIPs were obtained to evaluate the vascular anatomy. CONTRAST:  80 cc Isovue 370 COMPARISON:  CT of the abdomen pelvis dated 04/22/2015 FINDINGS: The visualized lung bases are clear.  Mild cardiomegaly. No intra-abdominal free air or free fluid. The liver, gallbladder, pancreas, spleen, and the adrenal glands appear unremarkable. Grossly stable appearing left renal inferior pole 15 cm cyst. Smaller left renal hypodense lesions are not well characterized but likely represent cysts. There is moderate bilateral renal parenchymal atrophy. Punctate nonobstructing right renal upper pole calculus versus vascular calcification. There is no hydronephrosis on either side. The visualized ureters and urinary bladder appear unremarkable. Mild enlargement of the prostate gland measuring 7 cm in greatest transverse axial dimension. Scattered sigmoid diverticula without active inflammatory changes. There is moderate stool throughout the colon. There is no evidence of bowel obstruction or active inflammation. Normal appendix. There is moderate aortoiliac atherosclerotic disease. There is no aneurysmal dilatation or dissection of the abdominal aorta. The origins of the celiac axis, SMA, IMA as well as the origins of the renal arteries remain patent. There is a classic celiac axis branching anatomy. There is atherosclerotic calcification of the common iliac arteries. No aneurysmal distention or dissection. LEFT LEG: There is complete occlusion of the distal left external iliac artery and common femoral artery. There is high-grade occlusion of the origin of the profundus femoris artery. There is clot with partial occlusion of the majority of the profundus femoris  artery. There is high-grade narrowing of the left superficial femoral artery with very minimal flow in the proximal portion of the SFA. There is near complete occlusion of the midportion of the SFA. There is high-grade occlusion of the distal SFA with reconstitution of minimal flow. There is high-grade occlusion of the popliteal artery with minimal flow. There is advanced atherosclerotic disease of the calf arteries. No flow identified in the distal popliteal artery or in the calf arteries. No flow seen in the plantar artery or dorsalis pedis artery. RIGHT LEG: The right common femoral artery, deep and superficial femoral arteries, and the popliteal artery appear patent. There is atherosclerotic calcification of the anterior and posterior tibial arteries as well as the peroneal artery. There is diminished flow within the calf arteries of the right lower extremity. There is very diminished flow or absent flow in the anterior and posterior tibial arteries as well as the peroneal artery distally. No significant flow identified in the dorsalis pedis or plantar artery of the right foot on this CT. Evaluation however is limited due to extensive atherosclerotic calcification of the calf arteries and arteries of the foot. Correlation with clinical exam and duplex ultrasound recommended. IMPRESSION: LEFT LEG: Near complete occlusion of the distal external iliac, and common femoral artery on the left with extensive high-grade narrowing of the left superficial femoral artery and near complete occlusion of the midportion of the SFA. High-grade narrowing of the distal SFA and popliteal artery with very diminished flow. Essentially no flow is identified in the left lower extremity below the knee. There is advanced atherosclerotic disease of the arteries of the calf. RIGHT  LEG: Patent femoral and popliteal arteries. Advanced atherosclerotic calcification of the arteries of the calf and foot with diminished flow within this  vessels. Minimal or absent flow in the arteries of the right lower extremity at and distal to the ankle. Evaluation however is somewhat limited due to advanced atherosclerotic disease. Correlation with clinical exam and duplex ultrasound recommended. No acute intra-abdominal or pelvic pathology identified. Moderate to advanced aortoiliac atherosclerotic disease. No aneurysmal dilatation or dissection. These results were called by telephone at the time of interpretation on 07/02/2016 at 6:23 am to Dr. Joseph Berkshire , who verbally acknowledged these results. Electronically Signed   By: Anner Crete M.D.   On: 07/02/2016 06:33    Assessment:  Principal Problem:   Embolism ([place]) Active Problems:   Permanent atrial fibrillation (HCC)   CKD (chronic kidney disease), stage III   Plan:  1. A-fib is not well-rate controlled- at this point. His a-fib is permanent. Not on b-blocker as an outpatient due to good rate-control, although he does have a history of aortic aneurysm. Would advise starting metoprolol tartrate 12.5 mg BID for rate control. Can uptitrate as needed after follow-up.  Time Spent Directly with Patient:  15 minutes  Length of Stay:  LOS: 1 day   Cesar Casino, MD, [facility] Attending Cardiologist [place] 07/03/2016, 12:21 PM

## 2016-07-03 NOTE — Clinical Social Work Placement (Signed)
   CLINICAL SOCIAL WORK PLACEMENT  NOTE  Date:  07/03/2016  Patient Details  Name: Cesar [name] MRN: [id number] Date of Birth: 09/20/1929  Clinical Social Work is seeking post-discharge placement for this patient at the [place] level of care (*CSW will initial, date and re-position this form in  chart as items are completed):  Yes   Patient/family provided with [facility] Work Department's list of facilities offering this level of care within the geographic area requested by the patient (or if unable, by the patient's family).  Yes   Patient/family informed of their freedom to choose among providers that offer the needed level of care, that participate in Medicare, Medicaid or managed care program needed by the patient, have an available bed and are willing to accept the patient.  Yes   Patient/family informed of [facility]'s ownership interest in [facility] and [facility], as well as of the fact that they are under no obligation to receive care at these facilities.  PASRR submitted to EDS on 07/03/16     PASRR number received on 07/03/16     Existing PASRR number confirmed on       FL2 transmitted to all facilities in geographic area requested by pt/family on 07/03/16     FL2 transmitted to all facilities within larger geographic area on       Patient informed that his/her managed care company has contracts with or will negotiate with certain facilities, including the following:            Patient/family informed of bed offers received.  Patient chooses bed at       Physician recommends and patient chooses bed at      Patient to be transferred to   on  .  Patient to be transferred to facility by       Patient family notified on   of transfer.  Name of family member notified:        PHYSICIAN Please sign FL2     Additional Comment:    _______________________________________________ Candie Chroman, LCSW 07/03/2016,  3:07 PM

## 2016-07-03 NOTE — Clinical Social Work Note (Addendum)
Clinical Social Work Assessment  Patient Details  Name: Cesar [name] MRN: [id number] Date of Birth: [birth year]  Date of referral:  07/03/16               Reason for consult:  Facility Placement, Discharge Planning                Permission sought to share information with:  Facility Sport and exercise psychologist, Family Supports Permission granted to share information::  Yes, Verbal Permission Granted  Name::        Agency::  SNF  Relationship::  Daughter  Contact Information:     Housing/Transportation Living arrangements for the past 2 months:  Single Family Home Source of Information:  Patient, Medical Team, Adult Children Patient Interpreter Needed:  None Criminal Activity/Legal Involvement Pertinent to Current Situation/Hospitalization:  No - Comment as needed Significant Relationships:  Adult Children, Spouse Lives with:  Spouse Do you feel safe going back to the place where you live?  Yes Need for family participation in patient care:  Yes (Comment)  Care giving concerns:  PT recommending SNF once medically stable for discharge.   Social Worker assessment / plan:  CSW met with patient. Daughter at bedside. CSW introduced role and explained that PT recommendations would be discussed. Patient gave verbal permission to discuss in front of his daughter. Patient prefers HHPT because he does not want to be away from his wife. Patient's daughter wants him to go to SNF. Explained that ultimately it is the patient's decision because he is alert and oriented x 4. CSW provided SNF list. Discussed required 3-night inpatient stay under Medicare for SNF. Patient's daughter will speak with patient's wife about placement as well. Patient did give permission to fax out referral for SNF. RNCM notified that HHPT is being discussed. No further concerns. CSW encouraged patient and his daughter to contact CSW as needed. CSW will continue to follow patient and his family for support and facilitate discharge  to SNF, if he is agreeable, once medically stable.  Employment status:  Retired Forensic scientist:  Medicare PT Recommendations:  [place] / Referral to community resources:  [place]  Patient/Family's Response to care:  Patient prefers HHPT because he does not want to be away from his wife. Patient's daughter agreeable to SNF. Patient's family supportive and involved in patient's care. Patient and his daughter appreciated social work intervention.  Patient/Family's Understanding of and Emotional Response to Diagnosis, Current Treatment, and Prognosis:  Patient and his daughter understand the need for PT at this time. Patient and his daughter appear happy with hospital care.  Emotional Assessment Appearance:  Appears stated age Attitude/Demeanor/Rapport:  Other (Pleasant) Affect (typically observed):  Appropriate, Calm, Pleasant Orientation:  Oriented to Self, Oriented to Place, Oriented to  Time, Oriented to Situation Alcohol / Substance use:  Never Used Psych involvement (Current and /or in the community):  No (Comment)  Discharge Needs  Concerns to be addressed:  Care Coordination Readmission within the last 30 days:  No Current discharge risk:  Dependent with Mobility Barriers to Discharge:  No Barriers Identified   Candie Chroman, LCSW 07/03/2016, 3:02 PM

## 2016-07-03 NOTE — Progress Notes (Signed)
  [facility] Progress Note  Subjective  - POD #1  Left foot feels ok. Denies any hematuria.   Objective Vitals:   07/02/16 2235 07/03/16 0340  BP: 114/89 117/60  Pulse: 88 82  Resp: (!) 25 14  Temp:  98.3 F (36.8 C)    Intake/Output Summary (Last 24 hours) at 07/03/16 0738 Last data filed at 07/02/16 2236  Gross per 24 hour  Intake          1092.42 ml  Output              400 ml  Net           692.42 ml   Left groin with bloody drainage on dressing. No active bleeding. Groin without hematoma.  Audible left DP, peroneal and PT doppler flow. Right DP and PT doppler flow. Left foot with motor and sensation intact.  Normal respiratory effort. Lungs clear.   Assessment/Planning: 82 y.o. male is s/p: Left femoral exploration with embolectomy of external iliac, common femoral, profundus femoral artery 1 Day Post-Op   LLE ischemia s/p embolectomy: Left foot well perfused.  Afib: rate 90s-100s this am. On heparin gtt. No hematuria. No other signs of bleeding. Ok to resume anticoagulation per urology and cardiology. Restart coumadin today.   Appreciate cardiology and urology assistance.  Mobilize today.  Transfer to [place] 07/03/2016 7:38 AM --  Laboratory CBC    Component Value Date/Time   WBC 6.5 07/03/2016 0227   HGB 11.1 (L) 07/03/2016 0227   HCT 35.5 (L) 07/03/2016 0227   PLT 167 07/03/2016 0227    BMET    Component Value Date/Time   NA 138 07/03/2016 0227   K 4.2 07/03/2016 0227   CL 107 07/03/2016 0227   CO2 24 07/03/2016 0227   GLUCOSE 102 (H) 07/03/2016 0227   BUN 14 07/03/2016 0227   CREATININE 1.57 (H) 07/03/2016 0227   CALCIUM 8.0 (L) 07/03/2016 0227   GFRNONAA 39 (L) 07/03/2016 0227   GFRAA 45 (L) 07/03/2016 0227    COAG Lab Results  Component Value Date   INR 1.54 07/02/2016   INR 2.06 (H) 01/01/2012   INR 1.1 02/08/2008   No results found for: PTT  Antibiotics Anti-infectives    Start     Dose/Rate  Route Frequency Ordered Stop   07/02/16 1230  cefUROXime (ZINACEF) 1.5 g in dextrose 5 % 50 mL IVPB     1.5 g 100 mL/hr over 30 Minutes Intravenous Every 12 hours 07/02/16 1149 07/03/16 0016       [place] Jock, PA-C [facility] Office: [phone] Pager: [phone] 07/03/2016 7:38 AM   I have examined the patient, reviewed and agree with above.Left groin without hematoma.  2+ poplit and dp pulse.  Transfer to 2w.  DC when theraputic INR.  HR staying in the 120s Will ask Cardiology to address  Curt Jews, MD 07/03/2016 10:32 AM

## 2016-07-03 NOTE — Evaluation (Signed)
Occupational Therapy Evaluation Patient Details Name: Cesar [name] MRN: [id number] DOB: 1929/09/17 Today's Date: 07/03/2016    History of Present Illness 82 y.o. male with history of BPH (170g on Ct), AAA, aortic stenosis, a fib, depression, HLD, OSA admitted for left leg ischemia 2/2 left CFA occlusion after recent short trial off coumadin (5d) secondary to hematuria. He is now s/p left CFA embolization   Clinical Impression   Pt with a hx of falls. Walked with a cane with wife supervising and was independent in self care prior to admission. Presents with poor activity tolerance with a fib, poor balance and impaired cognition. Pt requires 2 person assist for safety with mobility and max assist for LB bathing and dressing. Pt and family are agreeable to short term rehab in SNF prior to return home.    Follow Up Recommendations  SNF;Supervision/Assistance - 24 hour    Equipment Recommendations    Defer to next venue   Recommendations for Other Services       Precautions / Restrictions Precautions Precautions: Fall Restrictions Weight Bearing Restrictions: No      Mobility Bed Mobility               General bed mobility comments: received in chair  Transfers Overall transfer level: Needs assistance Equipment used: Rolling walker (2 wheeled) Transfers: Sit to/from Stand Sit to Stand: Min assist;+2 physical assistance;+2 safety/equipment              Balance Overall balance assessment: Needs assistance;History of Falls   Sitting balance-Leahy Scale: Fair     Standing balance support: Bilateral upper extremity supported;During functional activity Standing balance-Leahy Scale: Poor Standing balance comment: posterior lean noted, requires hand on assist with UE support                            ADL Overall ADL's : Needs assistance/impaired Eating/Feeding: Set up;Sitting   Grooming: Wash/dry hands;Wash/dry face;Sitting;Set up   Upper Body  Bathing: Minimal assitance;Sitting   Lower Body Bathing: Maximal assistance;Sit to/from stand   Upper Body Dressing : Minimal assistance;Sitting   Lower Body Dressing: Maximal assistance;Sit to/from Health and safety inspector Details (indicate cue type and reason): stood with min assist to use urinal, supporting with one UE on IV pole Toileting- Clothing Manipulation and Hygiene: Minimal assistance (standing)       Functional mobility during ADLs: Minimal assistance;+2 for safety/equipment General ADL Comments: Decreased activity tolerance, pt in afib     Vision     Perception     Praxis      Pertinent Vitals/Pain Pain Assessment: No/denies pain     Hand Dominance Right   Extremity/Trunk Assessment Upper Extremity Assessment Upper Extremity Assessment: Overall WFL for tasks assessed   Lower Extremity Assessment Lower Extremity Assessment: Defer to PT evaluation       Communication Communication Communication: HOH (wearing hearing aids)   Cognition Arousal/Alertness: Awake/alert Behavior During Therapy: Impulsive Overall Cognitive Status: Impaired/Different from baseline Area of Impairment: Memory               General Comments: seems to attempt to use humor to divert from memory deficit   General Comments       Exercises       Shoulder Instructions      Home Living Family/patient expects to be discharged to:: Private residence Living Arrangements: Spouse/significant other Available Help at Discharge: Family;Available 24 hours/day Type of Home: House  Home Access: Stairs to enter Entrance Stairs-Number of Steps: 2 Entrance Stairs-Rails: None Home Layout: One level;Other (Comment) (basement, but rarely goes down there)     Bathroom Shower/Tub: Occupational psychologist: Handicapped height     Home Equipment: [place] - single point;Shower seat          Prior Functioning/Environment Level of Independence: Needs assistance  Gait /  Transfers Assistance Needed: wife supervised walking ADL's / Homemaking Assistance Needed: sat to shower, dressed independently, wife does cooking and cleaning            OT Problem List: Decreased strength;Decreased activity tolerance;Impaired balance (sitting and/or standing);Decreased knowledge of use of DME or AE;Decreased cognition;Decreased safety awareness;Cardiopulmonary status limiting activity   OT Treatment/Interventions: Self-care/ADL training;Energy conservation;DME and/or AE instruction;Therapeutic activities;Cognitive remediation/compensation;Patient/family education;Balance training    OT Goals(Current goals can be found in the care plan section) Acute Rehab OT Goals Patient Stated Goal: to get better on his feet OT Goal Formulation: With patient Time For Goal Achievement: 07/17/16 Potential to Achieve Goals: Good ADL Goals Pt Will Perform Grooming: with supervision;standing (2 activities) Pt Will Perform Lower Body Bathing: with supervision;sit to/from stand Pt Will Perform Lower Body Dressing: with supervision;sit to/from stand Pt Will Transfer to Toilet: with supervision;ambulating;bedside commode (over toilet) Pt Will Perform Toileting - Clothing Manipulation and hygiene: with supervision;sit to/from stand Pt Will Perform Tub/Shower Transfer: Shower transfer;ambulating;shower seat;rolling walker;with supervision Additional ADL Goal #1: Pt will demonstrate ability to self monitor and request rest breaks during activity for energy conservation.  OT Frequency: Min 2X/week   Barriers to D/C:            Co-evaluation PT/OT/SLP Co-Evaluation/Treatment: Yes Reason for Co-Treatment: For patient/therapist safety   OT goals addressed during session: ADL's and self-care      End of Session Equipment Utilized During Treatment: Gait belt;Rolling walker Nurse Communication:  (HR)  Activity Tolerance: Treatment limited secondary to medical complications (Comment)  (elevated HR) Patient left: in chair;with call bell/phone within reach;with family/visitor present   Time: [id number] OT Time Calculation (min): 32 min Charges:  OT General Charges $OT Visit: 1 Procedure OT Evaluation $OT Eval Moderate Complexity: 1 Procedure G-Codes:    Malka So 07/03/2016, 2:29 PM  [phone]

## 2016-07-03 NOTE — Evaluation (Signed)
Physical Therapy Evaluation Patient Details Name: Cesar [name] MRN: [id number] DOB: Jul 21, 1929 Today's Date: 07/03/2016   History of Present Illness  82 y.o. male with history of BPH (170g on Ct), AAA, aortic stenosis, a fib, depression, HLD, OSA admitted for left leg ischemia 2/2 left CFA occlusion after recent short trial off coumadin (5d) secondary to hematuria. He is now s/p left CFA embolization  Clinical Impression  Patient demonstrates deficits in functional mobility as indicated below. Will need continued skilled PT to address deficits and maixmize function. Will see as indicated and progress as tolerated. Will need st SNF upon acute discharge to improve safety with functional mobility.    Follow Up Recommendations SNF;Supervision/Assistance - 24 hour    Equipment Recommendations   (TBD)    Recommendations for Other Services       Precautions / Restrictions Precautions Precautions: Fall Restrictions Weight Bearing Restrictions: No      Mobility  Bed Mobility               General bed mobility comments: received in chair  Transfers Overall transfer level: Needs assistance Equipment used: Rolling walker (2 wheeled) Transfers: Sit to/from Stand Sit to Stand: Min assist;+2 physical assistance;+2 safety/equipment            Ambulation/Gait Ambulation/Gait assistance: Mod assist Ambulation Distance (Feet): 30 Feet Assistive device: Rolling walker (2 wheeled) Gait Pattern/deviations: Step-through pattern;Decreased stride length;Shuffle;Drifts right/left;Narrow base of support Gait velocity: decreased Gait velocity interpretation: <1.8 ft/sec, indicative of risk for recurrent falls General Gait Details: significant posterior lean, requires hands on physical assist for stability and pacing, chair follow required. Patient easily fatigues with HR elevating to upper 170s with very minimal activity  Stairs            Wheelchair Mobility    Modified  [place] (Stroke Patients Only)       Balance Overall balance assessment: Needs assistance;History of Falls   Sitting balance-Leahy Scale: Fair     Standing balance support: Bilateral upper extremity supported;During functional activity Standing balance-Leahy Scale: Poor Standing balance comment: posterior lean noted, requires hand on assist with UE support                             Pertinent Vitals/Pain      Home Living Family/patient expects to be discharged to:: Private residence Living Arrangements: Spouse/significant other Available Help at Discharge: Family;Available 24 hours/day Type of Home: House Home Access: Stairs to enter Entrance Stairs-Rails: None Entrance Stairs-Number of Steps: 2 Home Layout: One level;Other (Comment) (basement, but rarely goes down there) Home Equipment: Cane - single point;Shower seat      Prior Function Level of Independence: Needs assistance   Gait / Transfers Assistance Needed: wife supervised walking  ADL's / Homemaking Assistance Needed: sat to shower, dressed independently, wife does cooking and cleaning        Hand Dominance   Dominant Hand: Right    Extremity/Trunk Assessment               Lower Extremity Assessment: Generalized weakness (decreased coordination bilaterally)         Communication   Communication: HOH (wearing hearing aids)  Cognition Arousal/Alertness: Awake/alert Behavior During Therapy: WFL for tasks assessed/performed Overall Cognitive Status: Impaired/Different from baseline Area of Impairment: Memory               General Comments: seems to attempt to use humor to divert from memory deficit  General Comments      Exercises     Assessment/Plan    PT Assessment Patient needs continued PT services  PT Problem List Decreased strength;Decreased activity tolerance;Decreased balance;Decreased mobility;Decreased coordination;Decreased safety awareness;Cardiopulmonary  status limiting activity          PT Treatment Interventions DME instruction;Gait training;Stair training;Functional mobility training;Therapeutic activities;Therapeutic exercise;Balance training;Patient/family education    PT Goals (Current goals can be found in the Care Plan section)  Acute Rehab PT Goals Patient Stated Goal: to get better on his feet PT Goal Formulation: With patient/family Time For Goal Achievement: 07/17/16 Potential to Achieve Goals: Good    Frequency Min 3X/week   Barriers to discharge        Co-evaluation PT/OT/SLP Co-Evaluation/Treatment: Yes Reason for Co-Treatment: For patient/therapist safety           End of Session Equipment Utilized During Treatment: Gait belt Activity Tolerance: Patient limited by fatigue;Treatment limited secondary to medical complications (Comment) (elevated HR) Patient left: in chair;with chair alarm set;with family/visitor present Nurse Communication: Mobility status         Time: 1032-1105 PT Time Calculation (min) (ACUTE ONLY): 33 min   Charges:   PT Evaluation $PT Eval Moderate Complexity: 1 Procedure     PT G CodesDuncan Dull 07-04-2016, 1:36 PM Alben Deeds, [place] DPT  [phone]

## 2016-07-03 NOTE — Progress Notes (Signed)
[place] for heparin, coumadin  Indication: atrial fibrillation  Allergies  Allergen Reactions  . Flomax [Tamsulosin Hcl] Other (See Comments)    Can't pee    Patient Measurements: Height: 6\' 1"  (185.4 cm) Weight: 212 lb 11.9 oz (96.5 kg) IBW/kg (Calculated) : 79.9 Heparin Dosing Weight: 96.5kg  Vital Signs: Temp: 98.1 F (36.7 C) (09/29 0749) Temp Source: Oral (09/29 0749) BP: 147/76 (09/29 0749) Pulse Rate: 95 (09/29 0749)  Labs:  Recent Labs  07/02/16 0350 07/02/16 2128 07/03/16 0227  HGB 12.3*  --  11.1*  HCT 37.5*  --  35.5*  PLT 160  --  167  APTT 35  --   --   LABPROT 18.7*  --   --   INR 1.54  --   --   HEPARINUNFRC  --  0.58 0.40  CREATININE 1.56*  --  1.57*  CKTOTAL 133  --   --     Estimated Creatinine Clearance: 42.1 mL/min (by C-G formula based on SCr of 1.57 mg/dL (H)).   Medical History: Past Medical History:  Diagnosis Date  . Aortic stenosis    a. mild-mod by echo 06/2016.  . Ascending aortic aneurysm ([place])   . CKD (chronic kidney disease), stage III    baseline Cr 1.4-1.5 in 2016-2017 by PCP)  . CVA (cerebral infarction)   . Depression   . DVT (deep venous thrombosis) ([place]) 2006  . Dyslipidemia   . Hematuria   . HOH (hard of hearing)   . Hypercholesterolemia   . OSA (obstructive sleep apnea)    intol to CPAP  . Permanent atrial fibrillation ([place])   . Prostate enlargement   . Pulmonary nodules     Medications:  Scheduled:  . docusate sodium  100 mg Oral Daily  . escitalopram  10 mg Oral Daily  . finasteride  5 mg Oral Daily  . pantoprazole  40 mg Oral Daily   . sodium chloride Stopped (07/03/16 0800)  . heparin 1,000 Units/hr (07/03/16 [id number])     Assessment: 82 y/o M on warfarin PTA for afib and this was recently. on hold due to hematuria. She is  was on heparin for ischemic limb and this was stopped for left lower extremity embolectomy on 9/28. Pharmacy consulted to dose heparin and  restart coumadin -INR= 1.54, heparin level = 0.4  Home coumadin regimen: 7.5mg /d  Goal of Therapy:  Heparin level 0.3-0.7 units/ml Monitor platelets by anticoagulation protocol: Yes   Plan:  -Continue IV heparin at current rate. -Daily heparin level and CBC. -Coumadin 10mg  po x1 -Daily PT/INR  Hildred Laser, Pharm D 07/03/2016 9:51 AM

## 2016-07-03 NOTE — Progress Notes (Signed)
Pt received from ICU. Pt and family oriented to room and equipment. CCMD notified x2. VSS. Dinner order placed. Call light within reach, will continue to monitor.   Fritz Pickerel, RN

## 2016-07-03 NOTE — NC FL2 (Signed)
[place] MEDICAID FL2 LEVEL OF CARE SCREENING TOOL     IDENTIFICATION  Patient Name: Cesar [name] Birthdate: 07-19-1929 Sex: male Admission Date (Current Location): 07/02/2016  [facility] [place] Number:  Herbalist and Address:  [facility] [place]. [facility], [place] [address] [place], [place] [address]      Provider Number: [id number]  Attending Physician Name and Address:  [name], MD  Relative Name and Phone Number:       Current Level of Care: Hospital Recommended Level of Care: [place] Prior Approval Number:    Date Approved/Denied:   PASRR Number: [id number]  Discharge Plan: SNF    Current Diagnoses: Patient Active Problem List   Diagnosis Date Noted  . Embolism ([place]) 07/02/2016  . CKD (chronic kidney disease), stage III   . Claudication ([place]) 06/04/2016  . Long term current use of anticoagulant 06/04/2016  . Abnormal ECG 06/04/2016  . Thoracic ascending aortic aneurysm ([place]) 04/22/2015  . Permanent atrial fibrillation ([place]) 05/11/2013  . Mild aortic stenosis 05/11/2013  . Orthostatic hypotension 05/11/2013  . Hyperlipidemia 05/11/2013    Orientation RESPIRATION BLADDER Height & Weight     Self, Time, Situation, Place  Normal Continent Weight: 212 lb 11.9 oz (96.5 kg) Height:  6\' 1"  (185.4 cm)  BEHAVIORAL SYMPTOMS/MOOD NEUROLOGICAL BOWEL NUTRITION STATUS   (None)  (None) Continent Diet (Heart healthy)  AMBULATORY STATUS COMMUNICATION OF NEEDS Skin   Limited Assist Verbally Surgical wounds                       Personal Care Assistance Level of Assistance  Bathing, Feeding, Dressing Bathing Assistance: Maximum assistance Feeding assistance: Independent Dressing Assistance: Maximum assistance     Functional Limitations Info  Sight, Hearing, Speech Sight Info: Adequate Hearing Info: Adequate Speech Info: Adequate    SPECIAL CARE FACTORS FREQUENCY  PT (By licensed PT), Blood pressure, OT (By  licensed OT)     PT Frequency: 5 x week OT Frequency: 5 x week            Contractures Contractures Info: Not present    Additional Factors Info  Code Status, Allergies Code Status Info: Full Allergies Info: Flomax (Tamsulosin Hcl)           Current Medications (07/03/2016):  This is the current hospital active medication list Current Facility-Administered Medications  Medication Dose Route Frequency Provider Last Rate Last Dose  . 0.9 %  sodium chloride infusion  500 mL Intravenous Once PRN Ulyses Amor, PA-C      . 0.9 %  sodium chloride infusion   Intravenous Continuous Ulyses Amor, PA-C   Stopped at 07/03/16 0800  . acetaminophen (TYLENOL) tablet 325-650 mg  325-650 mg Oral Q4H PRN Ulyses Amor, PA-C       Or  . acetaminophen (TYLENOL) suppository 325-650 mg  325-650 mg Rectal Q4H PRN Ulyses Amor, PA-C      . alum & mag hydroxide-simeth (MAALOX/MYLANTA) 200-200-20 MG/5ML suspension 15-30 mL  15-30 mL Oral Q2H PRN Ulyses Amor, PA-C      . bisacodyl (DULCOLAX) EC tablet 5 mg  5 mg Oral Daily PRN Ulyses Amor, PA-C      . docusate sodium (COLACE) capsule 100 mg  100 mg Oral Daily Ulyses Amor, PA-C   100 mg at 07/03/16 0836  . escitalopram (LEXAPRO) tablet 10 mg  10 mg Oral Daily Ulyses Amor, PA-C  10 mg at 07/02/16 1312  . finasteride (PROSCAR) tablet 5 mg  5 mg Oral Daily Ulyses Amor, PA-C   5 mg at 07/03/16 0836  . guaiFENesin-dextromethorphan (ROBITUSSIN DM) 100-10 MG/5ML syrup 15 mL  15 mL Oral Q4H PRN Ulyses Amor, PA-C      . heparin ADULT infusion 100 units/mL (25000 units/23mL sodium chloride 0.45%)  1,000 Units/hr Intravenous Continuous Kris Mouton, RPH 10 mL/hr at 07/03/16 [id number] 1,000 Units/hr at 07/03/16 [id number]  . hydrALAZINE (APRESOLINE) injection 5 mg  5 mg Intravenous Q20 Min PRN Ulyses Amor, PA-C      . labetalol (NORMODYNE,TRANDATE) injection 10 mg  10 mg Intravenous Q10 min PRN Ulyses Amor, PA-C      . magnesium sulfate IVPB 2 g  50 mL  2 g Intravenous Daily PRN Ulyses Amor, PA-C      . metoprolol (LOPRESSOR) injection 2-5 mg  2-5 mg Intravenous Q2H PRN Ulyses Amor, PA-C      . metoprolol tartrate (LOPRESSOR) tablet 12.5 mg  12.5 mg Oral BID Pixie Casino, MD   12.5 mg at 07/03/16 1403  . morphine 2 MG/ML injection 2-5 mg  2-5 mg Intravenous Q1H PRN Ulyses Amor, PA-C   4 mg at 07/02/16 1505  . ondansetron (ZOFRAN) injection 4 mg  4 mg Intravenous Q6H PRN Ulyses Amor, PA-C      . oxyCODONE-acetaminophen (PERCOCET/ROXICET) 5-325 MG per tablet 1-2 tablet  1-2 tablet Oral Q4H PRN Ulyses Amor, PA-C   2 tablet at 07/02/16 2346  . pantoprazole (PROTONIX) EC tablet 40 mg  40 mg Oral Daily Ulyses Amor, PA-C   40 mg at 07/03/16 0835  . phenol (CHLORASEPTIC) mouth spray 1 spray  1 spray Mouth/Throat PRN Ulyses Amor, PA-C      . potassium chloride SA (K-DUR,KLOR-CON) CR tablet 20-40 mEq  20-40 mEq Oral Daily PRN Ulyses Amor, PA-C      . senna-docusate (Senokot-S) tablet 1 tablet  1 tablet Oral QHS PRN Ulyses Amor, PA-C      . warfarin (COUMADIN) tablet 10 mg  10 mg Oral ONCE-1800 Kris Mouton, [facility]      . Warfarin - Pharmacist Dosing Inpatient   Does not apply [place], [facility]         Discharge Medications: Please see discharge summary for a list of discharge medications.  Relevant Imaging Results:  Relevant Lab Results:   Additional Information SS#: [id number]  Candie Chroman, LCSW   Adele Barthel, MD, FACS [facility] Office: [phone] Pager: [phone]  07/04/2016, 11:34 AM

## 2016-07-04 LAB — CBC
HEMATOCRIT: 34.6 % — AB (ref 39.0–52.0)
HEMOGLOBIN: 11.1 g/dL — AB (ref 13.0–17.0)
MCH: 31.1 pg (ref 26.0–34.0)
MCHC: 32.1 g/dL (ref 30.0–36.0)
MCV: 96.9 fL (ref 78.0–100.0)
PLATELETS: 171 10*3/uL (ref 150–400)
RBC: 3.57 MIL/uL — AB (ref 4.22–5.81)
RDW: 13.2 % (ref 11.5–15.5)
WBC: 7.8 10*3/uL (ref 4.0–10.5)

## 2016-07-04 LAB — POCT INR: INR: 2 — AB (ref 0.9–1.1)

## 2016-07-04 LAB — HEPARIN LEVEL (UNFRACTIONATED): Heparin Unfractionated: 0.26 IU/mL — ABNORMAL LOW (ref 0.30–0.70)

## 2016-07-04 LAB — PROTIME-INR
INR: 1.98
Prothrombin Time: 22.8 seconds — ABNORMAL HIGH (ref 11.4–15.2)

## 2016-07-04 MED ORDER — WARFARIN SODIUM 7.5 MG PO TABS
7.5000 mg | ORAL_TABLET | Freq: Once | ORAL | Status: AC
Start: 2016-07-04 — End: 2016-07-04
  Administered 2016-07-04: 7.5 mg via ORAL
  Filled 2016-07-04: qty 1

## 2016-07-04 NOTE — Progress Notes (Signed)
Primary cardiologist: Dr. [name]  Seen for followup: Atrial fibrillation  Subjective:    Feels weak, abdomen somewhat distended. No palpitations or chest pain.  Objective:   Temp:  [93.3 F (34.1 C)-99.2 F (37.3 C)] 98.1 F (36.7 C) (09/30 0556) Pulse Rate:  [77-106] 78 (09/30 0946) Resp:  [18-22] 18 (09/30 0556) BP: (108-145)/(62-90) 145/90 (09/30 0946) SpO2:  [95 %-98 %] 96 % (09/30 0556) Last BM Date: 07/01/16  Filed Weights   07/02/16 0354 07/02/16 1144  Weight: 214 lb (97.1 kg) 212 lb 11.9 oz (96.5 kg)    Intake/Output Summary (Last 24 hours) at 07/04/16 1137 Last data filed at 07/04/16 0500  Gross per 24 hour  Intake              240 ml  Output              500 ml  Net             -260 ml    Telemetry: Atrial fibrillation.  Exam:  General: Elderly male in no distress.  Lungs: Clear, nonlabored.  Cardiac: Irregularly irregular, no gallop.  Abdomen: Mildly distended, bowel sounds present, no guarding  Extremities: No pitting edema.  Lab Results:  Basic Metabolic Panel:  Recent Labs Lab 07/02/16 0350 07/03/16 0227  NA 141 138  K 3.9 4.2  CL 110 107  CO2 22 24  GLUCOSE 104* 102*  BUN 13 14  CREATININE 1.56* 1.57*  CALCIUM 8.6* 8.0*    Liver Function Tests:  Recent Labs Lab 07/02/16 0350  AST 20  ALT 15*  ALKPHOS 42  BILITOT 0.6  PROT 6.4*  ALBUMIN 3.2*    CBC:  Recent Labs Lab 07/02/16 0350 07/03/16 0227 07/04/16 0449  WBC 6.4 6.5 7.8  HGB 12.3* 11.1* 11.1*  HCT 37.5* 35.5* 34.6*  MCV 95.4 98.6 96.9  PLT 160 167 171    Cardiac Enzymes:  Recent Labs Lab 07/02/16 0350  CKTOTAL 133    Coagulation:  Recent Labs Lab 07/02/16 0350 07/04/16 0449  INR 1.54 1.98   Echocardiogram 06/18/2016: Study Conclusions  - Left ventricle: The cavity size was normal. Wall thickness was   increased in a pattern of mild LVH. Systolic function was normal.   The estimated ejection fraction was in the range of 60%  to 65%.   Indeterminant diastolic function (atrial fibrillation). - Aortic valve: Trileaflet; severely calcified leaflets. There was   trivial regurgitation. Mild stenosis by mean gradient, moderate   visually. Mean gradient (S): 14 mm Hg. - Aorta: Mildly dilated aortic root and ascending aorta. Ascending   aorta dimension: 43 mm. Aortic root dimension: 39 mm (ED). - Mitral valve: Mildly to moderately calcified annulus. There was   trivial regurgitation. - Left atrium: The atrium was severely dilated. - Right ventricle: The cavity size was normal. Systolic function   was normal. - Right atrium: The atrium was severely dilated. - Tricuspid valve: Peak RV-RA gradient (S): 28 mm Hg. - Pulmonary arteries: PA peak pressure: 31 mm Hg (S). - Inferior vena cava: The vessel was normal in size. The   respirophasic diameter changes were in the normal range (= 50%),   consistent with normal central venous pressure.  Impressions:  - The patient was in atrial fibrillation. Normal LV size with mild   LV hypertrophy. EF 60-65%. Normal RV size and systolic function.   Mild aortic stenosis by mean gradient, moderate visually. Severe   biatrial enlargement. Dilated ascending aorta to  4.3 cm.   Medications:   Scheduled Medications: . docusate sodium  100 mg Oral Daily  . escitalopram  10 mg Oral Daily  . finasteride  5 mg Oral Daily  . metoprolol tartrate  12.5 mg Oral BID  . pantoprazole  40 mg Oral Daily  . warfarin  7.5 mg Oral ONCE-1800  . Warfarin - Pharmacist Dosing Inpatient   Does not apply q1800     Infusions: . sodium chloride    . heparin 1,000 Units/hr (07/04/16 0429)     PRN Medications:  sodium chloride, acetaminophen **OR** acetaminophen, alum & mag hydroxide-simeth, bisacodyl, guaiFENesin-dextromethorphan, hydrALAZINE, labetalol, magnesium sulfate 1 - 4 g bolus IVPB, metoprolol, morphine injection, ondansetron, oxyCODONE-acetaminophen, phenol, potassium chloride,  senna-docusate   Assessment:   1. Permanent atrial fibrillation, recent RVR in the acute setting although heart rate is now better controlled after initiation of Lopressor. Continues on Coumadin with heparin bridge, INR 1.9.  2. Patient status post left femoral exploration with embolectomy of the external iliac, common femoral, profundus femoral artery.  3. CKD stage 3, creatinine 1.6.   Plan/Discussion:    Heart rate control better in atrial fibrillation, can assess control as patient further mobilizes. No adjustments made to Lopressor dose today. Patient's wife and daughter voiced concerns about the patient's mobility and question whether he may need further rehabilitation - I have asked them to discuss this with the primary team.   Satira Sark, M.D., F.A.C.C.

## 2016-07-04 NOTE — Progress Notes (Addendum)
  Progress Note    07/04/2016 10:47 AM 2 Days Post-Op  Subjective:  No complaints  Afebrile HR  70's-90's Afib [id number] systolic 0000000 RA  Vitals:   07/04/16 0556 07/04/16 0946  BP: 140/87 (!) 145/90  Pulse: 77 78  Resp: 18   Temp: 98.1 F (36.7 C)     Physical Exam: Cardiac:  irregular Lungs:  Non labored Incisions:  Clean with no evidence of bleeding Extremities:  Left foot is warm and well perfused.  +palpable left DP pulse.  Motor and sensation are in tact.    CBC    Component Value Date/Time   WBC 7.8 07/04/2016 0449   RBC 3.57 (L) 07/04/2016 0449   HGB 11.1 (L) 07/04/2016 0449   HCT 34.6 (L) 07/04/2016 0449   PLT 171 07/04/2016 0449   MCV 96.9 07/04/2016 0449   MCH 31.1 07/04/2016 0449   MCHC 32.1 07/04/2016 0449   RDW 13.2 07/04/2016 0449   LYMPHSABS 0.8 07/02/2016 0350   MONOABS 0.5 07/02/2016 0350   EOSABS 0.4 07/02/2016 0350   BASOSABS 0.0 07/02/2016 0350    BMET    Component Value Date/Time   NA 138 07/03/2016 0227   K 4.2 07/03/2016 0227   CL 107 07/03/2016 0227   CO2 24 07/03/2016 0227   GLUCOSE 102 (H) 07/03/2016 0227   BUN 14 07/03/2016 0227   CREATININE 1.57 (H) 07/03/2016 0227   CALCIUM 8.0 (L) 07/03/2016 0227   GFRNONAA 39 (L) 07/03/2016 0227   GFRAA 45 (L) 07/03/2016 0227    INR    Component Value Date/Time   INR 1.98 07/04/2016 0449     Intake/Output Summary (Last 24 hours) at 07/04/16 1047 Last data filed at 07/04/16 0500  Gross per 24 hour  Intake              240 ml  Output              500 ml  Net             -260 ml     Assessment:  82 y.o. male is s/p:  Left femoral exploration with embolectomy of external iliac, common femoral, profundus femoral artery  2 Days Post-Op  Plan: -pt doing well this morning with palpable left DP pulse -left groin looks good with no evidence of bleeding -DVT prophylaxis:  Heparin gtt bridge to coumadin--INR 1.98 today -needs to mobilize more today if HR okay, which looks  better this am.  Cardiology following-metoprolol started at 12.5 bid yesterday. -creatinine 1.57 yesterday.  Will check labs in am.   Leontine Locket, PA-C [facility] [phone] 07/04/2016 10:47 AM  Addendum  I have independently interviewed and examined the patient, and I agree with the physician assistant's findings.  Pt essential at target INR.  Cont Warfarin per Pharmacy.  Can switch off heparin.  PT is recommending SNF so awaiting placement.   Adele Barthel, MD, FACS [facility] [place] Office: [phone] Pager: [phone]  07/04/2016, 11:32 AM

## 2016-07-04 NOTE — Progress Notes (Signed)
Physical Therapy Treatment Patient Details Name: Cesar [name] MRN: [id number] DOB: [birth year] Today's Date: 07/04/2016    History of Present Illness 82 y.o. male with history of BPH (170g on Ct), AAA, aortic stenosis, a fib, depression, HLD, OSA admitted for left leg ischemia 2/2 left CFA occlusion after recent short trial off coumadin (5d) secondary to hematuria. He is now s/p left CFA embolization    PT Comments    Patient seen with spouse and daughter present.  Improved mobility and endurance from prior session.  Patient still needing MIN/MOD assist with overall mobility, and Supervision for safety.  Gait 2 x 60 feet today, with extended rest interval to recover.  Patient is improving, remains good therapy candidate, would still recommend SNF at this time due to medical status and safety, but could change pending progress.  Will remain on PT services, still need to assess stairs ability and manage other below listed impairments.  Follow Up Recommendations  SNF;Supervision/Assistance - 24 hour     Equipment Recommendations   (TBD, possible walker)    Recommendations for Other Services       Precautions / Restrictions Precautions Precautions: Fall Restrictions Weight Bearing Restrictions: No    Mobility  Bed Mobility Overal bed mobility: Needs Assistance Bed Mobility: Supine to Sit     Supine to sit: Min assist        Transfers Overall transfer level: Needs assistance Equipment used: Rolling walker (2 wheeled)   Sit to Stand: Min assist         General transfer comment: IV pole and walker  Ambulation/Gait Ambulation/Gait assistance: Min guard Ambulation Distance (Feet): 60 Feet (x2) Assistive device: Rolling walker (2 wheeled) Gait Pattern/deviations: Step-through pattern;Decreased stride length;Trunk flexed Gait velocity: decreased Gait velocity interpretation: <1.8 ft/sec, indicative of risk for recurrent falls General Gait Details: leans on FWW, cues to  stand straighter and 'steer' vs lean on walker.   Stairs            Wheelchair Mobility    Modified Rankin (Stroke Patients Only)       Balance     Sitting balance-Leahy Scale: Fair     Standing balance support: Single extremity supported Standing balance-Leahy Scale: Poor Standing balance comment: Able to stand for few seconds without device.                    Cognition Arousal/Alertness: Awake/alert Behavior During Therapy: Impulsive Overall Cognitive Status: Within Functional Limits for tasks assessed                 General Comments: Cues to slow down and wait for therapist to be ready.    Exercises      General Comments        Pertinent Vitals/Pain Pain Assessment: No/denies pain    Home Living                      Prior Function            PT Goals (current goals can now be found in the care plan section) Acute Rehab PT Goals Patient Stated Goal: to get better on his feet PT Goal Formulation: With patient/family Time For Goal Achievement: 07/17/16 Potential to Achieve Goals: Good Progress towards PT goals: Progressing toward goals    Frequency    Min 3X/week      PT Plan Current plan remains appropriate    Co-evaluation PT/OT/SLP Co-Evaluation/Treatment: Yes  End of Session Equipment Utilized During Treatment: Gait belt Activity Tolerance: Patient tolerated treatment well;Patient limited by fatigue Patient left: in chair;with family/visitor present;with call bell/phone within reach     Time: 1200-1220 PT Time Calculation (min) (ACUTE ONLY): 20 min  Charges:  $Gait Training: 8-22 mins                    G Codes:      Will Heinkel L 07/14/2016, 1:00 PM

## 2016-07-04 NOTE — Progress Notes (Signed)
[facility] for heparin, coumadin  Indication: atrial fibrillation  Allergies  Allergen Reactions  . Flomax [Tamsulosin Hcl] Other (See Comments)    Can't pee    Patient Measurements: Height: 6\' 1"  (185.4 cm) Weight: 212 lb 11.9 oz (96.5 kg) IBW/kg (Calculated) : 79.9 Heparin Dosing Weight: 96.5kg  Vital Signs: Temp: 98.1 F (36.7 C) (09/30 0556) Temp Source: Oral (09/30 0556) BP: 145/90 (09/30 0946) Pulse Rate: 78 (09/30 0946)  Labs:  Recent Labs  07/02/16 0350 07/02/16 2128 07/03/16 0227 07/04/16 0449  HGB 12.3*  --  11.1* 11.1*  HCT 37.5*  --  35.5* 34.6*  PLT 160  --  167 171  APTT 35  --   --   --   LABPROT 18.7*  --   --  22.8*  INR 1.54  --   --  1.98  HEPARINUNFRC  --  0.58 0.40 0.26*  CREATININE 1.56*  --  1.57*  --   CKTOTAL 133  --   --   --     Estimated Creatinine Clearance: 42.1 mL/min (by C-G formula based on SCr of 1.57 mg/dL (H)).   Medical History: Past Medical History:  Diagnosis Date  . Aortic stenosis    a. mild-mod by echo 06/2016.  . Ascending aortic aneurysm ([place])   . CKD (chronic kidney disease), stage III    baseline Cr 1.4-1.5 in 2016-2017 by PCP)  . CVA (cerebral infarction)   . Depression   . DVT (deep venous thrombosis) ([place]) 2006  . Dyslipidemia   . Hematuria   . HOH (hard of hearing)   . Hypercholesterolemia   . OSA (obstructive sleep apnea)    intol to CPAP  . Permanent atrial fibrillation ([place])   . Prostate enlargement   . Pulmonary nodules     Medications:  Scheduled:  . docusate sodium  100 mg Oral Daily  . escitalopram  10 mg Oral Daily  . finasteride  5 mg Oral Daily  . metoprolol tartrate  12.5 mg Oral BID  . pantoprazole  40 mg Oral Daily  . Warfarin - Pharmacist Dosing Inpatient   Does not apply q1800   . sodium chloride    . heparin 1,000 Units/hr (07/04/16 0429)     Assessment: 82 y/o M on warfarin PTA for afib and this was recently. on hold due to hematuria.  She is  was on heparin for ischemic limb and this was stopped for left lower extremity embolectomy on 9/28. Pharmacy consulted to dose heparin and restart coumadin -INR= 1.98, heparin level = 0.26  Home coumadin regimen: 7.5mg /d  Goal of Therapy:  Heparin level 0.3-0.7 units/ml Monitor platelets by anticoagulation protocol: Yes   Plan:  -Heparin to 1150 units / hr. -Daily heparin level and CBC. -Coumadin 7.5 mg po x1 -Daily PT/INR  Thank you Anette Guarneri, PharmD [phone]  07/04/2016 11:00 AM

## 2016-07-05 LAB — PROTIME-INR
INR: 1.95
PROTHROMBIN TIME: 22.6 s — AB (ref 11.4–15.2)

## 2016-07-05 LAB — BASIC METABOLIC PANEL
ANION GAP: 6 (ref 5–15)
BUN: 19 mg/dL (ref 4–21)
BUN: 19 mg/dL (ref 6–20)
CALCIUM: 8.6 mg/dL — AB (ref 8.9–10.3)
CO2: 25 mmol/L (ref 22–32)
CREATININE: 1.5 mg/dL — AB (ref 0.6–1.3)
Chloride: 107 mmol/L (ref 101–111)
Creatinine, Ser: 1.47 mg/dL — ABNORMAL HIGH (ref 0.61–1.24)
GFR calc Af Amer: 48 mL/min — ABNORMAL LOW (ref >60)
GFR, EST NON AFRICAN AMERICAN: 42 mL/min — AB (ref >60)
GLUCOSE: 113 mg/dL — AB (ref 65–99)
Glucose: 113 mg/dL
Potassium: 3.8 mmol/L (ref 3.5–5.1)
SODIUM: 138 mmol/L (ref 135–145)
Sodium: 138 mmol/L (ref 137–147)

## 2016-07-05 LAB — CBC
HEMATOCRIT: 33.4 % — AB (ref 39.0–52.0)
HEMOGLOBIN: 10.6 g/dL — AB (ref 13.0–17.0)
MCH: 30.9 pg (ref 26.0–34.0)
MCHC: 31.7 g/dL (ref 30.0–36.0)
MCV: 97.4 fL (ref 78.0–100.0)
Platelets: 164 10*3/uL (ref 150–400)
RBC: 3.43 MIL/uL — ABNORMAL LOW (ref 4.22–5.81)
RDW: 13.2 % (ref 11.5–15.5)
WBC: 7 10*3/uL (ref 4.0–10.5)

## 2016-07-05 LAB — POCT INR: INR: 2 — AB (ref 0.9–1.1)

## 2016-07-05 MED ORDER — OXYCODONE-ACETAMINOPHEN 5-325 MG PO TABS: 1.0000 | ORAL_TABLET | Freq: Four times a day (QID) | ORAL | 0 refills | Status: DC | PRN

## 2016-07-05 MED ORDER — WARFARIN SODIUM 10 MG PO TABS
10.0000 mg | ORAL_TABLET | Freq: Once | ORAL | Status: AC
  Administered 2016-07-05: 10 mg via ORAL
  Filled 2016-07-05: qty 1

## 2016-07-05 NOTE — Progress Notes (Signed)
Cesar [name] for Coumadin  Indication: atrial fibrillation  Allergies  Allergen Reactions  . Flomax [Tamsulosin Hcl] Other (See Comments)    Can't pee    Patient Measurements: Height: 6\' 1"  (185.4 cm) Weight: 212 lb 11.9 oz (96.5 kg) IBW/kg (Calculated) : 79.9 Heparin Dosing Weight: 96.5kg  Vital Signs: Temp: 97.8 F (36.6 C) (10/01 0559) Temp Source: Oral (10/01 0559) BP: 123/69 (10/01 0559) Pulse Rate: 91 (10/01 0559)  Labs:  Recent Labs  07/02/16 2128  07/03/16 0227 07/04/16 0449 07/05/16 0252  HGB  --   < > 11.1* 11.1* 10.6*  HCT  --   --  35.5* 34.6* 33.4*  PLT  --   --  167 171 164  LABPROT  --   --   --  22.8* 22.6*  INR  --   --   --  1.98 1.95  HEPARINUNFRC 0.58  --  0.40 0.26*  --   CREATININE  --   --  1.57*  --  1.47*  < > = values in this interval not displayed.  Estimated Creatinine Clearance: 44.9 mL/min (by C-G formula based on SCr of 1.47 mg/dL (H)).   Medical History: Past Medical History:  Diagnosis Date  . Aortic stenosis    a. mild-mod by echo 06/2016.  . Ascending aortic aneurysm ([place])   . CKD (chronic kidney disease), stage III    baseline Cr 1.4-1.5 in 2016-2017 by PCP)  . CVA (cerebral infarction)   . Depression   . DVT (deep venous thrombosis) ([place]) 2006  . Dyslipidemia   . Hematuria   . HOH (hard of hearing)   . Hypercholesterolemia   . OSA (obstructive sleep apnea)    intol to CPAP  . Permanent atrial fibrillation ([place])   . Prostate enlargement   . Pulmonary nodules     Medications:  Scheduled:  . docusate sodium  100 mg Oral Daily  . escitalopram  10 mg Oral Daily  . finasteride  5 mg Oral Daily  . metoprolol tartrate  12.5 mg Oral BID  . pantoprazole  40 mg Oral Daily  . Warfarin - Pharmacist Dosing Inpatient   Does not apply q1800       Assessment: 82 y/o M on warfarin PTA for afib and this was recently. on hold due to hematuria. She is  was on heparin for ischemic limb and  this was stopped for left lower extremity embolectomy on 9/28.   Continues on Coumadin, INR still low today at 1.95  Home coumadin regimen: 7.5mg /d  Goal of Therapy:  INR = 2 to 3 Monitor platelets by anticoagulation protocol: Yes   Plan:  Coumadin 10 mg po x 1 today Daily INR  Thank you Anette Guarneri, PharmD [phone]  07/05/2016 12:20 PM

## 2016-07-05 NOTE — Clinical Social Work Note (Signed)
Bed offers provided to the patient and family at bedside. CSW encouraged the family to make a decision about which facility they would like the patient to discharge to and that unit CSW would followup tomorrow.   Liz Beach MSW, [place], [place], [id number]

## 2016-07-05 NOTE — Progress Notes (Addendum)
  Progress Note    07/05/2016 7:27 AM 3 Days Post-Op  Subjective:  Wants to know when he can go home.  No pain in his left leg  Afebrile HR 80's-90's Afib 0000000 systolic [id number] RA  Vitals:   07/04/16 1941 07/05/16 0559  BP: 109/66 123/69  Pulse: 84 91  Resp: 17 18  Temp: 98.2 F (36.8 C) 97.8 F (36.6 C)    Physical Exam: Cardiac:  irregular Lungs:  Non labored but audible wheezing Incisions:  Left groin is clean and dry without hematoma Extremities:  Left foot is warm and well perfused.   CBC    Component Value Date/Time   WBC 7.0 07/05/2016 0252   RBC 3.43 (L) 07/05/2016 0252   HGB 10.6 (L) 07/05/2016 0252   HCT 33.4 (L) 07/05/2016 0252   PLT 164 07/05/2016 0252   MCV 97.4 07/05/2016 0252   MCH 30.9 07/05/2016 0252   MCHC 31.7 07/05/2016 0252   RDW 13.2 07/05/2016 0252   LYMPHSABS 0.8 07/02/2016 0350   MONOABS 0.5 07/02/2016 0350   EOSABS 0.4 07/02/2016 0350   BASOSABS 0.0 07/02/2016 0350    BMET    Component Value Date/Time   NA 138 07/05/2016 0252   K 3.8 07/05/2016 0252   CL 107 07/05/2016 0252   CO2 25 07/05/2016 0252   GLUCOSE 113 (H) 07/05/2016 0252   BUN 19 07/05/2016 0252   CREATININE 1.47 (H) 07/05/2016 0252   CALCIUM 8.6 (L) 07/05/2016 0252   GFRNONAA 42 (L) 07/05/2016 0252   GFRAA 48 (L) 07/05/2016 0252    INR    Component Value Date/Time   INR 1.95 07/05/2016 0252     Intake/Output Summary (Last 24 hours) at 07/05/16 0727 Last data filed at 07/05/16 0559  Gross per 24 hour  Intake                0 ml  Output              200 ml  Net             -200 ml     Assessment:  82 y.o. male is s/p:  Left femoral exploration with embolectomy of external iliac, common femoral, profundus femoral artery   3 Days Post-Op  Plan: -pt doing well this morning-he does have some audible wheezing, which he and his wife states he has at home.  Will ask respiratory tx to evaluate and treat. -INR is 1.95 today-discussed with his wife and  his usual dose of coumadin is 7.5mg , which is what he received last pm.  He has received 10 & 7.5.  Pharmacy dosing coumadin. -left groin without hematoma -PT worked with pt yesterday and he did well, however, they are still recommending SNF.  He does have a step to get into his house.  Will keep today to evaluate mobility and dose coumadin. -heart rate is better controlled.  No changes made to his beta blocker yesterday. -creatinine improved this morning to 1.47 from 1.57   Leontine Locket, PA-C [facility] [phone] 07/05/2016 7:27 AM   Addendum  I have independently interviewed and examined the patient, and I agree with the physician assistant's findings.  Awaiting SNF placement.  INR nearly at target  Adele Barthel, MD, [place] [facility] [place] Office: [phone] Pager: [phone]  07/05/2016, 8:56 AM

## 2016-07-05 NOTE — Progress Notes (Signed)
Primary cardiologist: Dr. [name]  Seen for followup: Atrial fibrillation  Subjective:    Sitting up in chair. Feels a little bit better today. No palpitations or chest pain.  Objective:   Temp:  [97.8 F (36.6 C)-98.5 F (36.9 C)] 97.8 F (36.6 C) (10/01 0559) Pulse Rate:  [83-91] 91 (10/01 0559) Resp:  [17-18] 18 (10/01 0559) BP: (109-155)/(66-72) 123/69 (10/01 0559) SpO2:  [97 %-99 %] 99 % (10/01 0559) Last BM Date: 07/01/16  Filed Weights   07/02/16 0354 07/02/16 1144  Weight: 214 lb (97.1 kg) 212 lb 11.9 oz (96.5 kg)    Intake/Output Summary (Last 24 hours) at 07/05/16 1116 Last data filed at 07/05/16 0841  Gross per 24 hour  Intake              120 ml  Output              200 ml  Net              -80 ml    Telemetry: Atrial fibrillation, rate controlled.  Exam:  General: Elderly male in no distress.  Lungs: Clear, nonlabored.  Cardiac: Irregularly irregular, no gallop.  Abdomen: Mildly distended, bowel sounds present, no guarding  Extremities: No pitting edema.  Lab Results:  Basic Metabolic Panel:  Recent Labs Lab 07/02/16 0350 07/03/16 0227 07/05/16 0252  NA 141 138 138  K 3.9 4.2 3.8  CL 110 107 107  CO2 22 24 25   GLUCOSE 104* 102* 113*  BUN 13 14 19   CREATININE 1.56* 1.57* 1.47*  CALCIUM 8.6* 8.0* 8.6*    CBC:  Recent Labs Lab 07/03/16 0227 07/04/16 0449 07/05/16 0252  WBC 6.5 7.8 7.0  HGB 11.1* 11.1* 10.6*  HCT 35.5* 34.6* 33.4*  MCV 98.6 96.9 97.4  PLT 167 171 164    Cardiac Enzymes:  Recent Labs Lab 07/02/16 0350  CKTOTAL 133    Coagulation:  Recent Labs Lab 07/02/16 0350 07/04/16 0449 07/05/16 0252  INR 1.54 1.98 1.95   Echocardiogram 06/18/2016: Study Conclusions  - Left ventricle: The cavity size was normal. Wall thickness was   increased in a pattern of mild LVH. Systolic function was normal.   The estimated ejection fraction was in the range of 60% to 65%.   Indeterminant diastolic  function (atrial fibrillation). - Aortic valve: Trileaflet; severely calcified leaflets. There was   trivial regurgitation. Mild stenosis by mean gradient, moderate   visually. Mean gradient (S): 14 mm Hg. - Aorta: Mildly dilated aortic root and ascending aorta. Ascending   aorta dimension: 43 mm. Aortic root dimension: 39 mm (ED). - Mitral valve: Mildly to moderately calcified annulus. There was   trivial regurgitation. - Left atrium: The atrium was severely dilated. - Right ventricle: The cavity size was normal. Systolic function   was normal. - Right atrium: The atrium was severely dilated. - Tricuspid valve: Peak RV-RA gradient (S): 28 mm Hg. - Pulmonary arteries: PA peak pressure: 31 mm Hg (S). - Inferior vena cava: The vessel was normal in size. The   respirophasic diameter changes were in the normal range (= 50%),   consistent with normal central venous pressure.  Impressions:  - The patient was in atrial fibrillation. Normal LV size with mild   LV hypertrophy. EF 60-65%. Normal RV size and systolic function.   Mild aortic stenosis by mean gradient, moderate visually. Severe   biatrial enlargement. Dilated ascending aorta to 4.3 cm.   Medications:   Scheduled Medications: .  docusate sodium  100 mg Oral Daily  . escitalopram  10 mg Oral Daily  . finasteride  5 mg Oral Daily  . metoprolol tartrate  12.5 mg Oral BID  . pantoprazole  40 mg Oral Daily  . Warfarin - Pharmacist Dosing Inpatient   Does not apply q1800    PRN Medications: sodium chloride, acetaminophen **OR** acetaminophen, alum & mag hydroxide-simeth, bisacodyl, guaiFENesin-dextromethorphan, hydrALAZINE, labetalol, magnesium sulfate 1 - 4 g bolus IVPB, metoprolol, morphine injection, ondansetron, oxyCODONE-acetaminophen, phenol, potassium chloride, senna-docusate   Assessment:   1. Permanent atrial fibrillation, recent RVR in the acute setting although heart rate is now better controlled after initiation of  Lopressor. Continues on Coumadin with INR 1.9.  2. Patient status post left femoral exploration with embolectomy of the external iliac, common femoral, profundus femoral artery.  3. CKD stage 3, creatinine 1.5.   Plan/Discussion:    Continue current dose of Lopressor 12.5 mg twice daily, on Coumadin per pharmacy. If no further adjustments made in regimen, may ultimately consider putting him on Toprol-XL 25 mg daily for simplicity.   Satira Sark, M.D., F.A.C.C.

## 2016-07-06 ENCOUNTER — Telehealth: Payer: Self-pay | Admitting: Vascular Surgery

## 2016-07-06 DIAGNOSIS — I482 Chronic atrial fibrillation: Secondary | ICD-10-CM | POA: Diagnosis not present

## 2016-07-06 DIAGNOSIS — N183 Chronic kidney disease, stage 3 (moderate): Secondary | ICD-10-CM | POA: Diagnosis not present

## 2016-07-06 DIAGNOSIS — M6281 Muscle weakness (generalized): Secondary | ICD-10-CM | POA: Diagnosis not present

## 2016-07-06 DIAGNOSIS — F329 Major depressive disorder, single episode, unspecified: Secondary | ICD-10-CM | POA: Diagnosis not present

## 2016-07-06 DIAGNOSIS — N400 Enlarged prostate without lower urinary tract symptoms: Secondary | ICD-10-CM | POA: Diagnosis not present

## 2016-07-06 DIAGNOSIS — I998 Other disorder of circulatory system: Secondary | ICD-10-CM | POA: Diagnosis not present

## 2016-07-06 DIAGNOSIS — R2681 Unsteadiness on feet: Secondary | ICD-10-CM | POA: Diagnosis not present

## 2016-07-06 DIAGNOSIS — I743 Embolism and thrombosis of arteries of the lower extremities: Secondary | ICD-10-CM | POA: Diagnosis not present

## 2016-07-06 DIAGNOSIS — Z48812 Encounter for surgical aftercare following surgery on the circulatory system: Secondary | ICD-10-CM | POA: Diagnosis not present

## 2016-07-06 DIAGNOSIS — I749 Embolism and thrombosis of unspecified artery: Secondary | ICD-10-CM | POA: Diagnosis not present

## 2016-07-06 DIAGNOSIS — R262 Difficulty in walking, not elsewhere classified: Secondary | ICD-10-CM | POA: Diagnosis not present

## 2016-07-06 DIAGNOSIS — I714 Abdominal aortic aneurysm, without rupture: Secondary | ICD-10-CM | POA: Diagnosis not present

## 2016-07-06 DIAGNOSIS — I350 Nonrheumatic aortic (valve) stenosis: Secondary | ICD-10-CM | POA: Diagnosis not present

## 2016-07-06 DIAGNOSIS — D62 Acute posthemorrhagic anemia: Secondary | ICD-10-CM | POA: Diagnosis not present

## 2016-07-06 DIAGNOSIS — Z7901 Long term (current) use of anticoagulants: Secondary | ICD-10-CM | POA: Diagnosis not present

## 2016-07-06 LAB — CBC
HEMATOCRIT: 31.3 % — AB (ref 39.0–52.0)
HEMOGLOBIN: 10 g/dL — AB (ref 13.0–17.0)
MCH: 31 pg (ref 26.0–34.0)
MCHC: 31.9 g/dL (ref 30.0–36.0)
MCV: 96.9 fL (ref 78.0–100.0)
Platelets: 177 10*3/uL (ref 150–400)
RBC: 3.23 MIL/uL — ABNORMAL LOW (ref 4.22–5.81)
RDW: 13.2 % (ref 11.5–15.5)
WBC: 6.5 10*3/uL (ref 4.0–10.5)

## 2016-07-06 LAB — PROTIME-INR
INR: 2.11
Prothrombin Time: 24 seconds — ABNORMAL HIGH (ref 11.4–15.2)

## 2016-07-06 LAB — CBC AND DIFFERENTIAL: WBC: 6.5 10^3/mL

## 2016-07-06 MED ORDER — METOPROLOL SUCCINATE ER 25 MG PO TB24: 25.0000 mg | ORAL_TABLET | Freq: Every day | ORAL | 0 refills | Status: DC

## 2016-07-06 MED ORDER — WARFARIN SODIUM 7.5 MG PO TABS: 7.5000 mg | ORAL_TABLET | Freq: Every day | ORAL | Status: DC

## 2016-07-06 MED ORDER — METOPROLOL SUCCINATE ER 25 MG PO TB24
25.0000 mg | ORAL_TABLET | Freq: Every day | ORAL | Status: DC
  Administered 2016-07-06: 25 mg via ORAL
  Filled 2016-07-06: qty 1

## 2016-07-06 NOTE — Telephone Encounter (Signed)
-----   Message from Mena Goes, RN sent at 07/05/2016  1:43 PM EDT ----- Regarding: 2 weeks postop   ----- Message ----- From: Gabriel Earing, PA-C Sent: 07/05/2016   7:37 AM To: [facility] Charge Pool  S/p Left femoral exploration with embolectomy of external iliac, common femoral, profundus femoral artery   F/u with Dr. Donnetta Hutching in 2 weeks.  Thanks

## 2016-07-06 NOTE — Progress Notes (Signed)
[facility] for Coumadin  Indication: atrial fibrillation  Allergies  Allergen Reactions  . Flomax [Tamsulosin Hcl] Other (See Comments)    Can't pee    Patient Measurements: Height: 6\' 1"  (185.4 cm) Weight: 212 lb 11.9 oz (96.5 kg) IBW/kg (Calculated) : 79.9 Heparin Dosing Weight: 96.5kg  Vital Signs: Temp: 98.9 F (37.2 C) (10/02 0409) Temp Source: Oral (10/02 0409) BP: 120/72 (10/02 0409) Pulse Rate: 79 (10/02 0409)  Labs:  Recent Labs  07/04/16 0449 07/05/16 0252 07/06/16 0345  HGB 11.1* 10.6* 10.0*  HCT 34.6* 33.4* 31.3*  PLT 171 164 177  LABPROT 22.8* 22.6* 24.0*  INR 1.98 1.95 2.11  HEPARINUNFRC 0.26*  --   --   CREATININE  --  1.47*  --     Estimated Creatinine Clearance: 44.9 mL/min (by C-G formula based on SCr of 1.47 mg/dL (H)).   Medical History: Past Medical History:  Diagnosis Date  . Aortic stenosis    a. mild-mod by echo 06/2016.  . Ascending aortic aneurysm ([place])   . CKD (chronic kidney disease), stage III    baseline Cr 1.4-1.5 in 2016-2017 by PCP)  . CVA (cerebral infarction)   . Depression   . DVT (deep venous thrombosis) ([place]) 2006  . Dyslipidemia   . Hematuria   . HOH (hard of hearing)   . Hypercholesterolemia   . OSA (obstructive sleep apnea)    intol to CPAP  . Permanent atrial fibrillation ([place])   . Prostate enlargement   . Pulmonary nodules     Medications:  Scheduled:  . docusate sodium  100 mg Oral Daily  . escitalopram  10 mg Oral Daily  . finasteride  5 mg Oral Daily  . metoprolol succinate  25 mg Oral Daily  . pantoprazole  40 mg Oral Daily  . Warfarin - Pharmacist Dosing Inpatient   Does not apply q1800      Assessment: 82 y/o M on warfarin PTA for afib and this was recently on hold due to hematuria. Was on heparin for ischemic limb and this was stopped for left lower extremity embolectomy on 9/28.   Continues on Coumadin, INR therapeutic x1 after increase to 10 mg  yesterday. INR today at 2.11. Hgb low stable, no s/sx bleeding noted, good PO intake. Per pt's wife- has been therapeutic on 7.5 mg po daily x several months prior to this episode.   Home coumadin regimen: 7.5mg /d  Goal of Therapy:  INR = 2 to 3 Monitor platelets by anticoagulation protocol: Yes   Plan:  Coumadin 7.5 mg po daily - Return to home dose as patient is to be discharged to SNF today.  Daily INR, CBC Monitor PO intake, DDI  Carlean Jews, Pharm.D. PGY1 Pharmacy Resident 10/2/20179:43 AM Pager [phone]

## 2016-07-06 NOTE — Care Management Important Message (Signed)
Important Message  Patient Details  Name: [name] MRN: [id number] Date of Birth: 07/22/1929   Medicare Important Message Given:  Yes    [name] 07/06/2016, 12:46 PM

## 2016-07-06 NOTE — Progress Notes (Signed)
[facility] [place]  Subjective  - Doing OK over all.     Objective 114/67 72 98.9 F (37.2 C) (Oral) 18 96% No intake or output data in the 24 hours ending 07/06/16 1319  Feet warm and well perfused, sensation grossly equal and intact Left groin soft without hematoma Heart irregular Lungs non labored breathing   Assessment/Planning: POD # 4 Left femoral exploration with embolectomy of external iliac, common femoral, profundus femoral artery  INR therapeutic 2.11 Cardiology added Toprol XL 25 mg for Permanent AF CKD stage 3   Cesar [name] Cesar [name] 07/06/2016 1:19 PM --  Laboratory Lab Results:  Recent Labs  07/05/16 0252 07/06/16 0345  WBC 7.0 6.5  HGB 10.6* 10.0*  HCT 33.4* 31.3*  PLT 164 177   BMET  Recent Labs  07/05/16 0252  NA 138  K 3.8  CL 107  CO2 25  GLUCOSE 113*  BUN 19  CREATININE 1.47*  CALCIUM 8.6*    COAG Lab Results  Component Value Date   INR 2.11 07/06/2016   INR 1.95 07/05/2016   INR 1.98 07/04/2016   No results found for: PTT

## 2016-07-06 NOTE — Progress Notes (Signed)
Physical Therapy Treatment Patient Details Name: Cesar [name] MRN: [id number] DOB: 12-28-1928 Today's Date: 07/06/2016    History of Present Illness 82 y.o. male with history of BPH (170g on Ct), AAA, aortic stenosis, a fib, depression, HLD, OSA admitted for left leg ischemia 2/2 left CFA occlusion after recent short trial off coumadin (5d) secondary to hematuria. He is now s/p left CFA embolization    PT Comments    Pt is up to walk with PT and demonstrates some difficulty with his energy level so controlled pace carefully.  Wearing augmented hearing device and was able to understand PT.  Continue to follow acutely to work on distances for gait and strength of LE's  Follow Up Recommendations  SNF     Equipment Recommendations  Rolling walker with 5" wheels    Recommendations for Other Services Rehab consult     Precautions / Restrictions Precautions Precautions: Fall Restrictions Weight Bearing Restrictions: No    Mobility  Bed Mobility Overal bed mobility: Needs Assistance Bed Mobility: Supine to Sit     Supine to sit: Min guard;Min assist        Transfers Overall transfer level: Needs assistance Equipment used: Rolling walker (2 wheeled) Transfers: Sit to/from [facility] Sit to Stand: Min guard;Min assist Stand pivot transfers: Min guard;Min assist       General transfer comment: walker with PT to assist IV  Ambulation/Gait Ambulation/Gait assistance: Min guard Ambulation Distance (Feet): 190 Feet Assistive device: Rolling walker (2 wheeled) Gait Pattern/deviations: Step-through pattern;Decreased stride length;Wide base of support;Trunk flexed;Drifts right/left Gait velocity: decreased Gait velocity interpretation: Below normal speed for age/gender General Gait Details: pt is slow and has stiff legged gait with poor control of ankles, rocks back slightly on heels due to lack of control of ankles   Stairs            Wheelchair  Mobility    Modified Rankin (Stroke Patients Only)       Balance     Sitting balance-Leahy Scale: Fair     Standing balance support: Bilateral upper extremity supported Standing balance-Leahy Scale: Poor                      Cognition Arousal/Alertness: Awake/alert Behavior During Therapy: Impulsive Overall Cognitive Status: Within Functional Limits for tasks assessed                 General Comments: reminded pt to control pace and verbally corrected his path planning    Exercises      General Comments        Pertinent Vitals/Pain Pain Assessment: No/denies pain    Home Living                      Prior Function            PT Goals (current goals can now be found in the care plan section) Progress towards PT goals: Progressing toward goals    Frequency    Min 3X/week      PT Plan Current plan remains appropriate    Co-evaluation             End of Session Equipment Utilized During Treatment: Gait belt Activity Tolerance: Patient tolerated treatment well;Patient limited by fatigue Patient left: in chair;with call bell/phone within reach;with chair alarm set;with family/visitor present     Time: 1140-1210 PT Time Calculation (min) (ACUTE ONLY): 30 min  Charges:  $Gait Training: 8-22 mins $  Therapeutic Activity: 8-22 mins                    G Codes:      Ramond Dial 31-Jul-2016, 1:25 PM    Mee Hives, PT MS Acute Rehab Dept. Number: [place] and [place]

## 2016-07-06 NOTE — Telephone Encounter (Signed)
LVM on home # for f/u on 10/17. lttr mailed

## 2016-07-06 NOTE — Care Management Note (Signed)
Case Management Note Previous CM note initiated by Zenon Mayo, RN-07/03/2016, 2:32 PM   Patient Details  Name: Cesar [name] MRN: [id number] Date of Birth: [birth year]  Subjective/Objective:  S/p left femoral expoloration with embolectomy of external iliac, per pt eval rec snf, CSW aware.  Per CSW she spoke with patient regarding snf, daughter wants him to go to snf and patient wants [facility] but will let CSW fax information out.  NCM will cont to follow for dc needs.               Action/Plan: PTA pt lived at home with daughters- plan for SNF- CSW following for placement needs  Expected Discharge Date:    07/06/16             Expected Discharge Plan:  [place]  In-House Referral:  Clinical Social Work  Discharge planning Services  CM Consult  Post Acute Care Choice:    Choice offered to:     DME Arranged:    DME Agency:     HH Arranged:    [place] Agency:     Status of Service:  Completed, signed off  If discussed at [employer], dates discussed:    Discharge Disposition:  Skilled facility  Additional Comments:  07/06/16- Valentina Gu, CM- per CSW pt going to [facility].   Dahlia Client [place], RN 07/06/2016, 2:47 PM [phone]

## 2016-07-06 NOTE — Clinical Social Work Placement (Signed)
   CLINICAL SOCIAL WORK PLACEMENT  NOTE  Date:  07/06/2016  Patient Details  Name: Cesar [name] MRN: [id number] Date of Birth: 1929/04/03  Clinical Social Work is seeking post-discharge placement for this patient at the [place] level of care (*CSW will initial, date and re-position this form in  chart as items are completed):  Yes   Patient/family provided with [place] Work Department's list of facilities offering this level of care within the geographic area requested by the patient (or if unable, by the patient's family).  Yes   Patient/family informed of their freedom to choose among providers that offer the needed level of care, that participate in Medicare, Medicaid or managed care program needed by the patient, have an available bed and are willing to accept the patient.  Yes   Patient/family informed of [facility]'s ownership interest in [facility] and [facility], as well as of the fact that they are under no obligation to receive care at these facilities.  PASRR submitted to EDS on 07/03/16     PASRR number received on 07/03/16     Existing PASRR number confirmed on       FL2 transmitted to all facilities in geographic area requested by pt/family on 07/03/16     FL2 transmitted to all facilities within larger geographic area on       Patient informed that his/her managed care company has contracts with or will negotiate with certain facilities, including the following:        Yes   Patient/family informed of bed offers received.  Patient chooses bed at [facility]     Physician recommends and patient chooses bed at      Patient to be transferred to [facility] on 07/06/16.  Patient to be transferred to facility by Family     Patient family notified on 07/06/16 of transfer.  Name of family member notified:  [name],[name]     PHYSICIAN Please sign FL2, Please prepare priority discharge  summary, including medications, Please prepare prescriptions     Additional Comment:  Per MD patient is ready to discharge to [facility]. RN, patient, patient's family, and facility notified of discharge. RN given phone number for report. Patient's wife will provide transportation to [facility]. CSW signing off.   _______________________________________________ Samule Dry, LCSW 07/06/2016, 3:00 PM

## 2016-07-06 NOTE — Progress Notes (Signed)
Patient discharged to [facility]. Report was called to the nurse. The wife and family members will be driving him to the facility. IV was dc'd and was intact. The wife had a copy of the discharge papers and the paper prescriptions.

## 2016-07-06 NOTE — Clinical Social Work Note (Signed)
CSW met with patient and patient's wife they reported preference for [facility]. CSW called admissions with [facility]. [facility] SNF has a bed available for the patient once medically stable.   [employer], LCSW [phone]

## 2016-07-06 NOTE — Discharge Summary (Signed)
[facility] Discharge Summary   Patient ID:  Cesar [name] MRN: [id number] DOB/AGE: 80-Apr-1930 80 y.o.  Admit date: 07/02/2016 Discharge date: 07/06/2016 Date of Surgery: 07/02/2016 Surgeon: Surgeon(s): [name], MD  Admission Diagnosis: Arterial embolism ([place]) [I74.9] Arterial embolism of leg ([place]) [I74.3]  Discharge Diagnoses:  Arterial embolism ([place]) [I74.9] Arterial embolism of leg ([place]) [I74.3]  Secondary Diagnoses: Past Medical History:  Diagnosis Date  . Aortic stenosis    a. mild-mod by echo 06/2016.  . Ascending aortic aneurysm ([employer])   . CKD (chronic kidney disease), stage III    baseline Cr 1.4-1.5 in 2016-2017 by PCP)  . CVA (cerebral infarction)   . Depression   . DVT (deep venous thrombosis) ([place]) 2006  . Dyslipidemia   . Hematuria   . HOH (hard of hearing)   . Hypercholesterolemia   . OSA (obstructive sleep apnea)    intol to CPAP  . Permanent atrial fibrillation ([place])   . Prostate enlargement   . Pulmonary nodules     Procedure(s): EMBOLECTOMY LEFT FEMORAL ARTERY  Discharged Condition: good  HPI: Cesar [name] is a 82 y.o. male, sensitive emergency department with left leg ischemia. He has history of chronic atrial fibrillation. Had complications of hematuria and therefore had his Coumadin stopped approximately 5 days ago. Went to bed that without difficulty last night and awoke at approximately 2:00 unable to stand due to weakness and pain in his left leg and was brought to the emergency department by EMS. He has no prior history of peripheral vascular occlusive disease. No prior history of stroke. Does have a history of chronic atrial fibrillation and aortic stenosis. Also has a history of ascending thoracic aneurysm being followed by [facility]. He is extremely hard of hearing and most history is from his wife and son present. He has no prior history of embolic complication    Hospital Course:  Cesar [name] is a 82 y.o. male  is S/P Left Procedure(s): EMBOLECTOMY LEFT FEMORAL ARTERY  POD#1 Cardiology consult: Obviously needs to be back on anticoagulation given his acute thrombus.  I would suggest restarting warfarin with the potential for rapid reversal is the best option.   Left groin with bloody drainage on dressing. No active bleeding. Groin without hematoma.  Audible left DP, peroneal and PT doppler flow. Right DP and PT doppler flow. Left foot with motor and sensation intact.  Normal respiratory effort. Lungs clear.   Assessment/Planning: 82 y.o. male is s/p: Left femoral exploration with embolectomy of external iliac, common femoral, profundus femoral artery 1 Day Post-Op   LLE ischemia s/p embolectomy: Left foot well perfused.  Afib: rate 90s-100s this am. On heparin gtt. No hematuria. No other signs of bleeding. Ok to resume anticoagulation per urology and cardiology. Restart coumadin today.   Appreciate cardiology and urology assistance.  Mobilize today.  Transfer to 2W.   INR is therapeutic 2.11 Cardiology: Continue current dose of Lopressor 12.5 mg twice daily, on Coumadin per pharmacy. If no further adjustments made in regimen, may ultimately consider putting him on Toprol-XL 25 mg daily for simplicity.  07/06/2016 cardiology: will transition today to Toprol XL 25 mg. Discharge today to SNF in stable condition for rehab.      Consults:  Treatment Team:  Franchot Gallo, MD  Significant Diagnostic Studies: CBC Lab Results  Component Value Date   WBC 6.5 07/06/2016   HGB 10.0 (L) 07/06/2016   HCT 31.3 (L) 07/06/2016   MCV 96.9 07/06/2016  PLT 177 07/06/2016    BMET    Component Value Date/Time   NA 138 07/05/2016 0252   K 3.8 07/05/2016 0252   CL 107 07/05/2016 0252   CO2 25 07/05/2016 0252   GLUCOSE 113 (H) 07/05/2016 0252   BUN 19 07/05/2016 0252   CREATININE 1.47 (H) 07/05/2016 0252   CALCIUM 8.6 (L) 07/05/2016 0252   GFRNONAA 42 (L) 07/05/2016 0252   GFRAA 48 (L)  07/05/2016 0252   COAG Lab Results  Component Value Date   INR 2.11 07/06/2016   INR 1.95 07/05/2016   INR 1.98 07/04/2016     Disposition:  Discharge to :SNF Discharge Instructions    Call MD for:  redness, tenderness, or signs of infection (pain, swelling, bleeding, redness, odor or green/yellow discharge around incision site)    Complete by:  As directed    Call MD for:  severe or increased pain, loss or decreased feeling  in affected limb(s)    Complete by:  As directed    Call MD for:  temperature >100.5    Complete by:  As directed    Discharge wound care:    Complete by:  As directed    Wash the groin wound with soap and water daily and pat dry. (No tub bath-only shower)  Then put a dry gauze or washcloth there to keep this area dry daily and as needed.  Do not use Vaseline or neosporin on your incisions.  Only use soap and water on your incisions and then protect and keep dry.   Driving Restrictions    Complete by:  As directed    No driving for 2 weeks   Lifting restrictions    Complete by:  As directed    No lifting for 2 weeks   Resume previous diet    Complete by:  As directed      Scheduled Meds: . docusate sodium  100 mg Oral Daily  . escitalopram  10 mg Oral Daily  . finasteride  5 mg Oral Daily  . metoprolol succinate  25 mg Oral Daily  . pantoprazole  40 mg Oral Daily  . warfarin  7.5 mg Oral q1800  . Warfarin - Pharmacist Dosing Inpatient   Does not apply [phone]    Verbal and written Discharge instructions given to the patient. Wound care per Discharge AVS Follow-up Information    Early, Todd, MD In 2 weeks.   Specialties:  Vascular Surgery, Cardiology Why:  Office will call you to arrange your appt (sent) Contact information: [address] [place] [address] [phone]           Signed: Laurence Slate [facility] 07/06/2016, 1:20 PM

## 2016-07-06 NOTE — Progress Notes (Signed)
Subjective:  No chest pain; hard of hearing  Objective:   Vital Signs : Vitals:   07/04/16 1941 07/05/16 0559 07/05/16 2125 07/06/16 0409  BP: 109/66 123/69 134/69 120/72  Pulse: 84 91 77 79  Resp: _0 Temp: 98.2 F (36.8 C) 97.8 F (36.6 C) 98.7 F (37.1 C) 98.9 F (37.2 C)  TempSrc: Oral Oral Oral Oral  SpO2: 97% 99% 95% 96%  Weight:      Height:        Intake/Output from previous day: No intake or output data in the 24 hours ending 07/06/16 0857  I/O since admission:  Wt Readings from Last 3 Encounters:  07/02/16 212 lb 11.9 oz (96.5 kg)  06/04/16 214 lb 6.4 oz (97.3 kg)  05/27/16 218 lb (98.9 kg)    Medications: . docusate sodium  100 mg Oral Daily  . escitalopram  10 mg Oral Daily  . finasteride  5 mg Oral Daily  . metoprolol tartrate  12.5 mg Oral BID  . pantoprazole  40 mg Oral Daily  . Warfarin - Pharmacist Dosing Inpatient   Does not apply q1800       Physical Exam:   General appearance: alert, cooperative and mild distress Skin: mild rash from superficial pimples upper back Neck: no adenopathy, no JVD, supple, symmetrical, trachea midline and thyroid not enlarged, symmetric, no tenderness/mass/nodules Lungs: clear to auscultation bilaterally Heart: irregularly irregular rhythm and 1/6 sem Abdomen: soft, non-tender; bowel sounds normal; no masses,  no organomegaly Extremities: no edema, redness or tenderness in the calves or thighs; feet warm Neurologic: Grossly normal   Rate: 85  Rhythm: atrial fibrillation  ECG (independently read by me):  Lab Results:   Recent Labs  07/05/16 0252  NA 138  K 3.8  CL 107  CO2 25  GLUCOSE 113*  BUN 19  CREATININE 1.47*  CALCIUM 8.6*    Hepatic Function Latest Ref Rng & Units 07/02/2016  Total Protein 6.5 - 8.1 g/dL 6.4(L)  Albumin 3.5 - 5.0 g/dL 3.2(L)  AST 15 - 41 U/L 20  ALT 17 - 63 U/L 15(L)  Alk Phosphatase 38 - 126 U/L 42  Total Bilirubin 0.3 - 1.2 mg/dL 0.6     Recent  Labs  07/04/16 0449 07/05/16 0252 07/06/16 0345  WBC 7.8 7.0 6.5  HGB 11.1* 10.6* 10.0*  HCT 34.6* 33.4* 31.3*  MCV 96.9 97.4 96.9  PLT 171 164 177    No results for input(s): TROPONINI in the last 72 hours.  Invalid input(s): CK, MB  No results found for: TSH No results for input(s): HGBA1C in the last 72 hours.  No results for input(s): PROT, ALBUMIN, AST, ALT, ALKPHOS, BILITOT, BILIDIR, IBILI in the last 72 hours.  Recent Labs  07/06/16 0345  INR 2.11   BNP (last 3 results) No results for input(s): BNP in the last 8760 hours.  ProBNP (last 3 results) No results for input(s): PROBNP in the last 8760 hours.   Lipid Panel  No results found for: CHOL, TRIG, HDL, CHOLHDL, VLDL, LDLCALC, LDLDIRECT    Imaging:  No results found.    Assessment/Plan:   Principal Problem:   Embolism ([place]) Active Problems:   Permanent atrial fibrillation (H[facility])   CKD (chronic kidney disease), stage III   1. Permanent AF: currently on metoprolol 12.5 mg bid; will transition today to Toprol XL 25 mg 2. Patient status post left femoral exploration with embolectomy of the external iliac, common femoral, profundus femoral  artery. 3. Stage 3 CKD;  GFR 42 4. Coumadin anticoagulation  Therapeutic today at 2.11   Troy Sine, MD, [facility] 07/06/2016, 8:57 AM

## 2016-07-07 ENCOUNTER — Non-Acute Institutional Stay (SKILLED_NURSING_FACILITY): Payer: Medicare Other | Admitting: Internal Medicine

## 2016-07-07 ENCOUNTER — Telehealth: Payer: Self-pay

## 2016-07-07 ENCOUNTER — Encounter: Payer: Self-pay | Admitting: Internal Medicine

## 2016-07-07 DIAGNOSIS — F329 Major depressive disorder, single episode, unspecified: Secondary | ICD-10-CM

## 2016-07-07 DIAGNOSIS — N400 Enlarged prostate without lower urinary tract symptoms: Secondary | ICD-10-CM

## 2016-07-07 DIAGNOSIS — D62 Acute posthemorrhagic anemia: Secondary | ICD-10-CM | POA: Diagnosis not present

## 2016-07-07 DIAGNOSIS — I714 Abdominal aortic aneurysm, without rupture, unspecified: Secondary | ICD-10-CM | POA: Insufficient documentation

## 2016-07-07 DIAGNOSIS — I998 Other disorder of circulatory system: Secondary | ICD-10-CM | POA: Diagnosis not present

## 2016-07-07 DIAGNOSIS — I482 Chronic atrial fibrillation: Secondary | ICD-10-CM | POA: Diagnosis not present

## 2016-07-07 DIAGNOSIS — F32A Depression, unspecified: Secondary | ICD-10-CM

## 2016-07-07 DIAGNOSIS — I749 Embolism and thrombosis of unspecified artery: Secondary | ICD-10-CM | POA: Diagnosis not present

## 2016-07-07 DIAGNOSIS — I4891 Unspecified atrial fibrillation: Secondary | ICD-10-CM | POA: Insufficient documentation

## 2016-07-07 DIAGNOSIS — I4821 Permanent atrial fibrillation: Secondary | ICD-10-CM

## 2016-07-07 NOTE — Progress Notes (Addendum)
: Provider:  Noah Delaine. Sheppard Coil, MD Location:  [facility] Room Number: 100 P Place of Service:  SNF (31)  PCP: Dwan Bolt, MD Patient Care Team: Anda Kraft, MD as PCP - General (Endocrinology)  Extended Emergency Contact Information Primary Emergency Contact: Wulff,[name] Address: [address]          [place], [place] [address] [place] Phone: [phone] Relation: Spouse     Allergies: Flomax [tamsulosin hcl]  Chief Complaint  Patient presents with  . New Admit To SNF    Admit to Facility    HPI: Patient is 82 y.o. male with chronic atrial fibrillation. Had complications of hematuria and therefore had his Coumadin stopped approximately 5 days ago. Went to bed that without difficulty last night and awoke at approximately 2:00 unable to stand due to weakness and pain in his left leg and was brought to the emergency department by EMS. He has no prior history of peripheral vascular occlusive disease. No prior history of stroke. Pt was admitted to [facility] form 9/28-10/2 where he was dx with L leg ischemia and underwent an embolectomy of the external iliac, common femoral and profundus femoral arteries. Post -op period was unremarkable. Pt's coumadin was started post -op day 1. Pt is admitted to SNF for OT/PT. While at SNF pt will be followed for AF, tx with coumadin and metoprolol, depression, tx with lexapro and BPH, tx with finasteride.  Past Medical History:  Diagnosis Date  . AAA (abdominal aortic aneurysm) ([place])   . Aortic stenosis    a. mild-mod by echo 06/2016.  . Ascending aortic aneurysm ([place])   . Atrial fibrillation ([place])   . CKD (chronic kidney disease), stage III    baseline Cr 1.4-1.5 in 2016-2017 by PCP)  . CVA (cerebral infarction)   . Depression   . DVT (deep venous thrombosis) ([place]) 2006  . Dyslipidemia   . Hematuria   . HOH (hard of hearing)   . Hypercholesterolemia   . OSA (obstructive sleep apnea)     intol to CPAP  . Permanent atrial fibrillation ([place])   . Prostate enlargement   . Pulmonary nodules     Past Surgical History:  Procedure Laterality Date  . EMBOLECTOMY Left 07/02/2016   Procedure: EMBOLECTOMY LEFT FEMORAL ARTERY;  Surgeon: [name], MD;  Location: [facility];  Service: Vascular;  Laterality: Left;  . NM MYOCAR PERF WALL MOTION  08/24/2002   markedly positive,subtle lateral ischemia  . PROSTATE SURGERY    . US ECHOCARDIOGRAPHY  01/06/2012   mod. AOV ca+,mild to mod. AS,mod mostly posterior MAC-mild MR      Medication List       Accurate as of 07/07/16  9:56 AM. Always use your most recent med list.          docusate sodium 100 MG capsule Commonly known as:  COLACE Take 100 mg by mouth daily.   escitalopram 10 MG tablet Commonly known as:  LEXAPRO Take 10 mg by mouth daily.   finasteride 5 MG tablet Commonly known as:  PROSCAR Take 5 mg by mouth daily.   metoprolol succinate 25 MG 24 hr tablet Commonly known as:  TOPROL-XL Take 1 tablet (25 mg total) by mouth daily.   oxyCODONE-acetaminophen 5-325 MG tablet Commonly known as:  PERCOCET/ROXICET Take 1 tablet by mouth every 4 (four) hours as needed for severe pain.   pantoprazole 40 MG tablet Commonly known as:  PROTONIX Take  40 mg by mouth daily.   warfarin 5 MG tablet Commonly known as:  COUMADIN Take 7.5 mg by mouth daily. At 1800       Meds ordered this encounter  Medications  . docusate sodium (COLACE) 100 MG capsule    Sig: Take 100 mg by mouth daily.  . pantoprazole (PROTONIX) 40 MG tablet    Sig: Take 40 mg by mouth daily.  Marland Kitchen oxyCODONE-acetaminophen (PERCOCET/ROXICET) 5-325 MG tablet    Sig: Take 1 tablet by mouth every 4 (four) hours as needed for severe pain.     There is no immunization history on file for this patient.  Social History  Substance Use Topics  . Smoking status: Former Smoker    Packs/day: 1.00    Years: 10.00    Types: Cigarettes    Quit date: 10/05/1952    . Smokeless tobacco: Former Systems developer    Quit date: 10/05/1976  . Alcohol use 0.0 oz/week    Family history is   Family History  Problem Relation Age of Onset  . Heart failure Mother   . Diabetes Father   . Stroke Father       Review of Systems  DATA OBTAINED: from patient, nurse GENERAL:  no fevers, fatigue, appetite changes SKIN: no rash or diaphoresis EYES: No eye pain, redness, discharge EARS: No earache, tinnitus, change in hearing NOSE: No congestion, drainage or bleeding  MOUTH/THROAT: No mouth or tooth pain, No sore throat RESPIRATORY: No cough, wheezing, SOB CARDIAC: No chest pain, palpitations, lower extremity edema  GI: No abdominal pain, No N/V/D or constipation, No heartburn or reflux  GU: No dysuria, frequency or urgency, or incontinence  MUSCULOSKELETAL: No unrelieved bone/joint pain NEUROLOGIC: No headache, dizziness or focal weakness PSYCHIATRIC: No c/o anxiety or sadness   Vitals:   07/07/16 0928  BP: 121/75  Pulse: 84  Resp: 18  Temp: 98.6 F (37 C)    SpO2 Readings from Last 1 Encounters:  07/07/16 90%   Body mass index is 28.07 kg/m.     Physical Exam  GENERAL APPEARANCE: Alert,  No acute distress.  SKIN: bloody drainage at site, no sign of infection HEAD: Normocephalic, atraumatic  EYES: Conjunctiva/lids clear. Pupils round, reactive. EOMs intact.  EARS: External exam WNL, canals clear. Hearing grossly normal.  NOSE: No deformity or discharge.  MOUTH/THROAT: Lips w/o lesions  RESPIRATORY: Breathing is even, unlabored. Lung sounds are clear   CARDIOVASCULAR: Heart irreg no murmurs, rubs or gallops. No peripheral edema.   GASTROINTESTINAL: Abdomen is soft, non-tender, not distended w/ normal bowel sounds. GENITOURINARY: Bladder non tender, not distended  MUSCULOSKELETAL: No abnormal joints or musculature NEUROLOGIC:  Cranial nerves 2-12 grossly intact. Moves all extremities  PSYCHIATRIC: Mood and affect appropriate to situation, no  behavioral issues  Patient Active Problem List   Diagnosis Date Noted  . AAA (abdominal aortic aneurysm) ([place])   . Atrial fibrillation ([place])   . Embolism ([place]) 07/02/2016  . CKD (chronic kidney disease), stage III   . Claudication ([place]) 06/04/2016  . Long term current use of anticoagulant 06/04/2016  . Abnormal ECG 06/04/2016  . Thoracic ascending aortic aneurysm ([place]) 04/22/2015  . Permanent atrial fibrillation ([place]) 05/11/2013  . Mild aortic stenosis 05/11/2013  . Orthostatic hypotension 05/11/2013  . Hyperlipidemia 05/11/2013      Labs reviewed: Basic Metabolic Panel:    Component Value Date/Time   NA 138 07/05/2016 0252   NA 138 07/05/2016   K 3.8 07/05/2016 0252   CL 107 07/05/2016  0252   CO2 25 07/05/2016 0252   GLUCOSE 113 (H) 07/05/2016 0252   BUN 19 07/05/2016 0252   BUN 19 07/05/2016   CREATININE 1.47 (H) 07/05/2016 0252   CALCIUM 8.6 (L) 07/05/2016 0252   PROT 6.4 (L) 07/02/2016 0350   ALBUMIN 3.2 (L) 07/02/2016 0350   AST 20 07/02/2016 0350   ALT 15 (L) 07/02/2016 0350   ALKPHOS 42 07/02/2016 0350   BILITOT 0.6 07/02/2016 0350   GFRNONAA 42 (L) 07/05/2016 0252   GFRAA 48 (L) 07/05/2016 0252     Recent Labs  07/02/16 0350 07/03/16 0227 07/05/16 07/05/16 0252  NA 141 138 138 138  K 3.9 4.2  --  3.8  CL 110 107  --  107  CO2 22 24  --  25  GLUCOSE 104* 102*  --  113*  BUN 13 14 19 19   CREATININE 1.56* 1.57* 1.5* 1.47*  CALCIUM 8.6* 8.0*  --  8.6*   Liver Function Tests:  Recent Labs  07/02/16 0350  AST 20  ALT 15*  ALKPHOS 42  BILITOT 0.6  PROT 6.4*  ALBUMIN 3.2*   No results for input(s): LIPASE, AMYLASE in the last 8760 hours. No results for input(s): AMMONIA in the last 8760 hours. CBC:  Recent Labs  07/02/16 0350  07/04/16 0449 07/05/16 0252 07/06/16 07/06/16 0345  WBC 6.4  < > 7.8 7.0 6.5 6.5  NEUTROABS 4.7  --   --   --   --   --   HGB 12.3*  < > 11.1* 10.6*  --  10.0*  HCT 37.5*  < > 34.6* 33.4*  --  31.3*  MCV 95.4   < > 96.9 97.4  --  96.9  PLT 160  < > 171 164  --  177  < > = values in this interval not displayed. Lipid No results for input(s): CHOL, HDL, LDLCALC, TRIG in the last 8760 hours.  Cardiac Enzymes:  Recent Labs  07/02/16 0350  CKTOTAL 133   BNP: No results for input(s): BNP in the last 8760 hours. No results found for: MICROALBUR No results found for: HGBA1C No results found for: TSH No results found for: VITAMINB12 No results found for: FOLATE No results found for: IRON, TIBC, FERRITIN  Imaging and Procedures obtained prior to SNF admission: Ct Angio Ao+bifem W &/or Wo Contrast  Result Date: 07/02/2016 CLINICAL DATA:  82 year old male with left leg weakness and difficulty using left leg. EXAM: CT ANGIOGRAPHY OF ABDOMINAL AORTA WITH ILIOFEMORAL RUNOFF TECHNIQUE: Multidetector CT imaging of the abdomen, pelvis and lower extremities was performed using the standard protocol during bolus administration of intravenous contrast. Multiplanar CT image reconstructions and MIPs were obtained to evaluate the vascular anatomy. CONTRAST:  80 cc Isovue 370 COMPARISON:  CT of the abdomen pelvis dated 04/22/2015 FINDINGS: The visualized lung bases are clear.  Mild cardiomegaly. No intra-abdominal free air or free fluid. The liver, gallbladder, pancreas, spleen, and the adrenal glands appear unremarkable. Grossly stable appearing left renal inferior pole 15 cm cyst. Smaller left renal hypodense lesions are not well characterized but likely represent cysts. There is moderate bilateral renal parenchymal atrophy. Punctate nonobstructing right renal upper pole calculus versus vascular calcification. There is no hydronephrosis on either side. The visualized ureters and urinary bladder appear unremarkable. Mild enlargement of the prostate gland measuring 7 cm in greatest transverse axial dimension. Scattered sigmoid diverticula without active inflammatory changes. There is moderate stool throughout the colon.  There is no evidence of  bowel obstruction or active inflammation. Normal appendix. There is moderate aortoiliac atherosclerotic disease. There is no aneurysmal dilatation or dissection of the abdominal aorta. The origins of the celiac axis, SMA, IMA as well as the origins of the renal arteries remain patent. There is a classic celiac axis branching anatomy. There is atherosclerotic calcification of the common iliac arteries. No aneurysmal distention or dissection. LEFT LEG: There is complete occlusion of the distal left external iliac artery and common femoral artery. There is high-grade occlusion of the origin of the profundus femoris artery. There is clot with partial occlusion of the majority of the profundus femoris artery. There is high-grade narrowing of the left superficial femoral artery with very minimal flow in the proximal portion of the SFA. There is near complete occlusion of the midportion of the SFA. There is high-grade occlusion of the distal SFA with reconstitution of minimal flow. There is high-grade occlusion of the popliteal artery with minimal flow. There is advanced atherosclerotic disease of the calf arteries. No flow identified in the distal popliteal artery or in the calf arteries. No flow seen in the plantar artery or dorsalis pedis artery. RIGHT LEG: The right common femoral artery, deep and superficial femoral arteries, and the popliteal artery appear patent. There is atherosclerotic calcification of the anterior and posterior tibial arteries as well as the peroneal artery. There is diminished flow within the calf arteries of the right lower extremity. There is very diminished flow or absent flow in the anterior and posterior tibial arteries as well as the peroneal artery distally. No significant flow identified in the dorsalis pedis or plantar artery of the right foot on this CT. Evaluation however is limited due to extensive atherosclerotic calcification of the calf arteries and  arteries of the foot. Correlation with clinical exam and duplex ultrasound recommended. IMPRESSION: LEFT LEG: Near complete occlusion of the distal external iliac, and common femoral artery on the left with extensive high-grade narrowing of the left superficial femoral artery and near complete occlusion of the midportion of the SFA. High-grade narrowing of the distal SFA and popliteal artery with very diminished flow. Essentially no flow is identified in the left lower extremity below the knee. There is advanced atherosclerotic disease of the arteries of the calf. RIGHT LEG: Patent femoral and popliteal arteries. Advanced atherosclerotic calcification of the arteries of the calf and foot with diminished flow within this vessels. Minimal or absent flow in the arteries of the right lower extremity at and distal to the ankle. Evaluation however is somewhat limited due to advanced atherosclerotic disease. Correlation with clinical exam and duplex ultrasound recommended. No acute intra-abdominal or pelvic pathology identified. Moderate to advanced aortoiliac atherosclerotic disease. No aneurysmal dilatation or dissection. These results were called by telephone at the time of interpretation on 07/02/2016 at 6:23 am to Dr. Joseph Berkshire , who verbally acknowledged these results. Electronically Signed   By: Anner Crete M.D.   On: 07/02/2016 06:33     Not all labs, radiology exams or other studies done during hospitalization come through on my EPIC note; however they are reviewed by me.    Assessment and Plan  LLE ISCHEMIA/ LLE EMBOLECTOMY - embolectomy  of external iliac, common femoral and profundus femoral arteries with good result SNF - wound care and OT/PT  ABLA- d/c Hb 10.0 SNF - will f/u CBC  CHRONIC A FIB- coumadin restarted /d/c INR 2.11 SNF - cont current dose of lopressor 12.5 BID and will titrate coumadin as needed  DEPRESSION  SNF - not sted as unstable;contlexapro 10 mg  daily  BPH SNF - not stated as unstable ; cont finasteride 5 mg daily and monitor for retention    No problem-specific Assessment & Plan notes found for this encounter.   Noah Delaine. Sheppard Coil, MD

## 2016-07-07 NOTE — Telephone Encounter (Signed)
rec'd phone call from [facility] Nurse at [facility].  Reported that the pt. was admitted to their facility yesterday, and this AM, is the 1st time she has assessed the wound left groin.  Reported she noted a mod. amt. of bright red blood on bandage, and in assessing the wound, noted a steady trickle of bloody drainage.  Explained that in lifting the skin fold over the genital region and the skin of the left thigh, to better visualize the wound, she could then notice the blood drainage.  Questioned if there is constant bleeding from wound bed; stated it is not constant, and only noticeable as she pulls the surrounding skin apart, to better see the wound.  Advised to continue to monitor the bleeding/ drainage, and call office if becomes constant or increases. Verb. Understanding.

## 2016-07-08 ENCOUNTER — Telehealth: Payer: Self-pay

## 2016-07-08 ENCOUNTER — Encounter: Payer: Self-pay | Admitting: Vascular Surgery

## 2016-07-08 NOTE — Telephone Encounter (Signed)
Phone call from wound care nurse at [facility].  Reported left groin site continues to have a very slow steady trickle of bright red bloody drainage.  Reported she closely monitored it yesterday, and it had stopped.  Stated the dressing had a moderate amt. bloody drainage on ABD pad, prior to dressing change, and she observed a slow trickle of bloody drainage when the peri-wound skin edges were pulled apart, to visualize the groin wound.  Appt. given for 2:00 PM 07/09/16 for a wound check.  Nurse agreed with plan.

## 2016-07-08 NOTE — Telephone Encounter (Signed)
Scheduled appt for 07/09/16 w/ [facility] at 2pm/[facility]

## 2016-07-09 ENCOUNTER — Encounter: Payer: Self-pay | Admitting: Vascular Surgery

## 2016-07-09 ENCOUNTER — Ambulatory Visit (INDEPENDENT_AMBULATORY_CARE_PROVIDER_SITE_OTHER): Payer: Medicare Other | Admitting: Vascular Surgery

## 2016-07-09 ENCOUNTER — Non-Acute Institutional Stay (SKILLED_NURSING_FACILITY): Payer: Medicare Other | Admitting: Internal Medicine

## 2016-07-09 ENCOUNTER — Encounter: Payer: Self-pay | Admitting: Internal Medicine

## 2016-07-09 VITALS — BP 138/80 | HR 92 | Temp 97.2°F | Resp 16 | Ht 73.0 in | Wt 220.0 lb

## 2016-07-09 DIAGNOSIS — I482 Chronic atrial fibrillation, unspecified: Secondary | ICD-10-CM

## 2016-07-09 DIAGNOSIS — Z7901 Long term (current) use of anticoagulants: Secondary | ICD-10-CM

## 2016-07-09 DIAGNOSIS — I745 Embolism and thrombosis of iliac artery: Secondary | ICD-10-CM

## 2016-07-09 NOTE — Progress Notes (Signed)
Patient is a 82 year old male who recently had left femoral embolectomy by Dr. Donnetta Hutching. He has had some intermittent oozing of bloody drainage from his left groin. He has been on chronic Coumadin. His wife states his INR was 2.6 today. He was 2.8 yesterday.  Physical exam:  Vitals:   07/09/16 1351  BP: 138/80  Pulse: 92  Resp: 16  Temp: 97.2 F (36.2 C)  TempSrc: Oral  SpO2: 97%  Weight: 220 lb (99.8 kg)  Height: 6\' 1"  (1.854 m)    Left lower extremity: Biphasic DP and PT Doppler flow, left groin incision healing well small amount of fresh blood oozing from the central portion no focal hematoma  Right lower extremity: Biphasic DP PT Doppler flow  Assessment: Most likely some evacuation of hematoma from the left groin. However, the patient is coagulopathic therapeutically from his Coumadin. I believe the best option would be to stop his Coumadin for a few days to let the groin settled down. We will resume his Coumadin on October 9. I did discuss with the patient and his family today there is some risk of recurrent embolic phenomenon wall off the Coumadin but I thought the risk of this was fairly loaded compared to the risk of bleeding.  Plan: Stop Coumadin for a few days as listed above. Patient has follow-up scheduled with Dr. Donnetta Hutching on October 17.  Ruta Hinds, MD [facility] Office: [phone] Pager: [phone]

## 2016-07-09 NOTE — Progress Notes (Signed)
Location:  [facility] Room Number: 100 P Place of Service:  SNF (31)  Cesar [name]. Cesar Coil, MD  Patient Care Team: Anda Kraft, MD as PCP - General (Endocrinology)  Extended Emergency Contact Information Primary Emergency Contact: Killingsworth,[name] Address: [address]          [place], [place] [address] [place] Phone: [phone] Relation: Spouse    Allergies: Flomax [tamsulosin hcl]  Chief Complaint  Patient presents with  . Acute Visit    Acute    HPI: Patient is 82 y.o. male who is being seen for coumadin regulation. Pt was started on 7.5 mg on 9/29 and INR went out after 3 days and had to be held. On 10/4 INR had cone down to 2.8 and on 10/5 it is 2.6. Pt has continued to have bleeding from op site.  Past Medical History:  Diagnosis Date  . AAA (abdominal aortic aneurysm) ([place])   . Aortic stenosis    a. mild-mod by echo 06/2016.  . Ascending aortic aneurysm ([place])   . Atrial fibrillation ([place])   . CKD (chronic kidney disease), stage III    baseline Cr 1.4-1.5 in 2016-2017 by PCP)  . CVA (cerebral infarction)   . Depression   . DVT (deep venous thrombosis) ([place]) 2006  . Dyslipidemia   . Hematuria   . HOH (hard of hearing)   . Hypercholesterolemia   . OSA (obstructive sleep apnea)    intol to CPAP  . Permanent atrial fibrillation ([facility])   . Prostate enlargement   . Pulmonary nodules     Past Surgical History:  Procedure Laterality Date  . EMBOLECTOMY Left 07/02/2016   Procedure: EMBOLECTOMY LEFT FEMORAL ARTERY;  Surgeon: [name], MD;  Location: [facility];  Service: Vascular;  Laterality: Left;  . NM MYOCAR PERF WALL MOTION  08/24/2002   markedly positive,subtle lateral ischemia  . PROSTATE SURGERY    . US ECHOCARDIOGRAPHY  01/06/2012   mod. AOV ca+,mild to mod. AS,mod mostly posterior MAC-mild MR      Medication List       Accurate as of 07/09/16  2:26 PM. Always use your most recent med list.            docusate sodium 100 MG capsule Commonly known as:  COLACE Take 100 mg by mouth daily.   escitalopram 10 MG tablet Commonly known as:  LEXAPRO Take 10 mg by mouth daily.   finasteride 5 MG tablet Commonly known as:  PROSCAR Take 5 mg by mouth daily.   metoprolol succinate 25 MG 24 hr tablet Commonly known as:  TOPROL-XL Take 1 tablet (25 mg total) by mouth daily.   oxyCODONE-acetaminophen 5-325 MG tablet Commonly known as:  PERCOCET/ROXICET Take 1 tablet by mouth every 4 (four) hours as needed for severe pain.   pantoprazole 40 MG tablet Commonly known as:  PROTONIX Take 40 mg by mouth daily.   warfarin 5 MG tablet Commonly known as:  COUMADIN Take 7.5 mg by mouth daily. At 1800       No orders of the defined types were placed in this encounter.    There is no immunization history on file for this patient.  Social History  Substance Use Topics  . Smoking status: Former Smoker    Packs/day: 1.00    Years: 10.00    Types: Cigarettes    Quit date: 10/05/1952  . Smokeless tobacco: Former Systems developer    Quit date:  10/05/1976  . Alcohol use 0.0 oz/week    Review of Systems  DATA OBTAINED: from patient, nurse GENERAL:  no fevers, fatigue, appetite changes SKIN: No itching, rash; bleeding drom op sire- has slowed down some HEENT: No complaint RESPIRATORY: No cough, wheezing, SOB CARDIAC: No chest pain, palpitations, lower extremity edema  GI: No abdominal pain, No N/V/D or constipation, No heartburn or reflux  GU: No dysuria, frequency or urgency, or incontinence  MUSCULOSKELETAL: No unrelieved bone/joint pain NEUROLOGIC: No headache, dizziness  PSYCHIATRIC: No overt anxiety or sadness  Vitals:   07/09/16 1420  BP: 126/76  Pulse: 82  Resp: 18  Temp: 98.9 F (37.2 C)   Body mass index is 29.03 kg/m. Physical Exam  GENERAL APPEARANCE: Alert, conversant, No acute distress  SKIN: continued bleeding from op site, a little better HEENT:  Unremarkable RESPIRATORY: Breathing is even, unlabored. Lung sounds are clear   CARDIOVASCULAR: Heart irreg no murmurs, rubs or gallops. No peripheral edema  GASTROINTESTINAL: Abdomen is soft, non-tender, not distended w/ normal bowel sounds.  GENITOURINARY: Bladder non tender, not distended  MUSCULOSKELETAL: No abnormal joints or musculature NEUROLOGIC: Cranial nerves 2-12 grossly intact. Moves all extremities PSYCHIATRIC: Mood and affect appropriate to situation, no behavioral issues  Patient Active Problem List   Diagnosis Date Noted  . AAA (abdominal aortic aneurysm) ([place])   . Atrial fibrillation ([place])   . Embolism ([place]) 07/02/2016  . CKD (chronic kidney disease), stage III   . Claudication ([facility]) 06/04/2016  . Long term current use of anticoagulant 06/04/2016  . Abnormal ECG 06/04/2016  . Thoracic ascending aortic aneurysm ([place]) 04/22/2015  . Permanent atrial fibrillation ([place]) 05/11/2013  . Mild aortic stenosis 05/11/2013  . Orthostatic hypotension 05/11/2013  . Hyperlipidemia 05/11/2013    CMP     Component Value Date/Time   NA 138 07/05/2016 0252   NA 138 07/05/2016   K 3.8 07/05/2016 0252   CL 107 07/05/2016 0252   CO2 25 07/05/2016 0252   GLUCOSE 113 (H) 07/05/2016 0252   BUN 19 07/05/2016 0252   BUN 19 07/05/2016   CREATININE 1.47 (H) 07/05/2016 0252   CALCIUM 8.6 (L) 07/05/2016 0252   PROT 6.4 (L) 07/02/2016 0350   ALBUMIN 3.2 (L) 07/02/2016 0350   AST 20 07/02/2016 0350   ALT 15 (L) 07/02/2016 0350   ALKPHOS 42 07/02/2016 0350   BILITOT 0.6 07/02/2016 0350   GFRNONAA 42 (L) 07/05/2016 0252   GFRAA 48 (L) 07/05/2016 0252    Recent Labs  07/02/16 0350 07/03/16 0227 07/05/16 07/05/16 0252  NA 141 138 138 138  K 3.9 4.2  --  3.8  CL 110 107  --  107  CO2 22 24  --  25  GLUCOSE 104* 102*  --  113*  BUN 13 14 19 19   CREATININE 1.56* 1.57* 1.5* 1.47*  CALCIUM 8.6* 8.0*  --  8.6*    Recent Labs  07/02/16 0350  AST 20  ALT 15*  ALKPHOS 42   BILITOT 0.6  PROT 6.4*  ALBUMIN 3.2*    Recent Labs  07/02/16 0350  07/04/16 0449 07/05/16 0252 07/06/16 07/06/16 0345  WBC 6.4  < > 7.8 7.0 6.5 6.5  NEUTROABS 4.7  --   --   --   --   --   HGB 12.3*  < > 11.1* 10.6*  --  10.0*  HCT 37.5*  < > 34.6* 33.4*  --  31.3*  MCV 95.4  < > 96.9 97.4  --  96.9  PLT 160  < > 171 164  --  177  < > = values in this interval not displayed. No results for input(s): CHOL, LDLCALC, TRIG in the last 8760 hours.  Invalid input(s): HCL No results found for: MICROALBUR No results found for: TSH No results found for: HGBA1C No results found for: CHOL, HDL, LDLCALC, LDLDIRECT, TRIG, CHOLHDL  Significant Diagnostic Results in last 30 days:  Ct Angio Ao+bifem W &/or Wo Contrast  Result Date: 07/02/2016 CLINICAL DATA:  82 year old male with left leg weakness and difficulty using left leg. EXAM: CT ANGIOGRAPHY OF ABDOMINAL AORTA WITH ILIOFEMORAL RUNOFF TECHNIQUE: Multidetector CT imaging of the abdomen, pelvis and lower extremities was performed using the standard protocol during bolus administration of intravenous contrast. Multiplanar CT image reconstructions and MIPs were obtained to evaluate the vascular anatomy. CONTRAST:  80 cc Isovue 370 COMPARISON:  CT of the abdomen pelvis dated 04/22/2015 FINDINGS: The visualized lung bases are clear.  Mild cardiomegaly. No intra-abdominal free air or free fluid. The liver, gallbladder, pancreas, spleen, and the adrenal glands appear unremarkable. Grossly stable appearing left renal inferior pole 15 cm cyst. Smaller left renal hypodense lesions are not well characterized but likely represent cysts. There is moderate bilateral renal parenchymal atrophy. Punctate nonobstructing right renal upper pole calculus versus vascular calcification. There is no hydronephrosis on either side. The visualized ureters and urinary bladder appear unremarkable. Mild enlargement of the prostate gland measuring 7 cm in greatest transverse  axial dimension. Scattered sigmoid diverticula without active inflammatory changes. There is moderate stool throughout the colon. There is no evidence of bowel obstruction or active inflammation. Normal appendix. There is moderate aortoiliac atherosclerotic disease. There is no aneurysmal dilatation or dissection of the abdominal aorta. The origins of the celiac axis, SMA, IMA as well as the origins of the renal arteries remain patent. There is a classic celiac axis branching anatomy. There is atherosclerotic calcification of the common iliac arteries. No aneurysmal distention or dissection. LEFT LEG: There is complete occlusion of the distal left external iliac artery and common femoral artery. There is high-grade occlusion of the origin of the profundus femoris artery. There is clot with partial occlusion of the majority of the profundus femoris artery. There is high-grade narrowing of the left superficial femoral artery with very minimal flow in the proximal portion of the SFA. There is near complete occlusion of the midportion of the SFA. There is high-grade occlusion of the distal SFA with reconstitution of minimal flow. There is high-grade occlusion of the popliteal artery with minimal flow. There is advanced atherosclerotic disease of the calf arteries. No flow identified in the distal popliteal artery or in the calf arteries. No flow seen in the plantar artery or dorsalis pedis artery. RIGHT LEG: The right common femoral artery, deep and superficial femoral arteries, and the popliteal artery appear patent. There is atherosclerotic calcification of the anterior and posterior tibial arteries as well as the peroneal artery. There is diminished flow within the calf arteries of the right lower extremity. There is very diminished flow or absent flow in the anterior and posterior tibial arteries as well as the peroneal artery distally. No significant flow identified in the dorsalis pedis or plantar artery of the  right foot on this CT. Evaluation however is limited due to extensive atherosclerotic calcification of the calf arteries and arteries of the foot. Correlation with clinical exam and duplex ultrasound recommended. IMPRESSION: LEFT LEG: Near complete occlusion of the distal external iliac, and common  femoral artery on the left with extensive high-grade narrowing of the left superficial femoral artery and near complete occlusion of the midportion of the SFA. High-grade narrowing of the distal SFA and popliteal artery with very diminished flow. Essentially no flow is identified in the left lower extremity below the knee. There is advanced atherosclerotic disease of the arteries of the calf. RIGHT LEG: Patent femoral and popliteal arteries. Advanced atherosclerotic calcification of the arteries of the calf and foot with diminished flow within this vessels. Minimal or absent flow in the arteries of the right lower extremity at and distal to the ankle. Evaluation however is somewhat limited due to advanced atherosclerotic disease. Correlation with clinical exam and duplex ultrasound recommended. No acute intra-abdominal or pelvic pathology identified. Moderate to advanced aortoiliac atherosclerotic disease. No aneurysmal dilatation or dissection. These results were called by telephone at the time of interpretation on 07/02/2016 at 6:23 am to Dr. Joseph Berkshire , who verbally acknowledged these results. Electronically Signed   By: Anner Crete M.D.   On: 07/02/2016 06:33    Assessment and Plan  CHRONIC COUMADIN USE/S/P EMBOLECTOMY FOR CLOT LLE/ AF - INR is 2.6 today - wil restart coumadin at 6 mg daily and recheck in 48 hours   Romir Klimowicz D. Cesar Coil, MD

## 2016-07-10 LAB — BASIC METABOLIC PANEL
BUN: 20 mg/dL (ref 4–21)
Creatinine: 1.5 mg/dL — AB (ref 0.6–1.3)
GLUCOSE: 101 mg/dL
Potassium: 4.5 mmol/L (ref 3.4–5.3)
SODIUM: 139 mmol/L (ref 137–147)

## 2016-07-10 LAB — CBC AND DIFFERENTIAL
HEMATOCRIT: 29 % — AB (ref 41–53)
HEMOGLOBIN: 9.9 g/dL — AB (ref 13.5–17.5)
PLATELETS: 254 10*3/uL (ref 150–399)
WBC: 5.7 10^3/mL

## 2016-07-12 ENCOUNTER — Encounter: Payer: Self-pay | Admitting: Internal Medicine

## 2016-07-12 DIAGNOSIS — I998 Other disorder of circulatory system: Secondary | ICD-10-CM | POA: Insufficient documentation

## 2016-07-12 DIAGNOSIS — F329 Major depressive disorder, single episode, unspecified: Secondary | ICD-10-CM | POA: Insufficient documentation

## 2016-07-12 DIAGNOSIS — F32A Depression, unspecified: Secondary | ICD-10-CM | POA: Insufficient documentation

## 2016-07-12 DIAGNOSIS — D62 Acute posthemorrhagic anemia: Secondary | ICD-10-CM | POA: Insufficient documentation

## 2016-07-12 DIAGNOSIS — N400 Enlarged prostate without lower urinary tract symptoms: Secondary | ICD-10-CM | POA: Insufficient documentation

## 2016-07-14 ENCOUNTER — Non-Acute Institutional Stay (SKILLED_NURSING_FACILITY): Payer: Medicare Other | Admitting: Internal Medicine

## 2016-07-14 ENCOUNTER — Encounter: Payer: Self-pay | Admitting: Internal Medicine

## 2016-07-14 DIAGNOSIS — I998 Other disorder of circulatory system: Secondary | ICD-10-CM | POA: Diagnosis not present

## 2016-07-14 DIAGNOSIS — I482 Chronic atrial fibrillation: Secondary | ICD-10-CM | POA: Diagnosis not present

## 2016-07-14 DIAGNOSIS — F329 Major depressive disorder, single episode, unspecified: Secondary | ICD-10-CM

## 2016-07-14 DIAGNOSIS — N400 Enlarged prostate without lower urinary tract symptoms: Secondary | ICD-10-CM | POA: Diagnosis not present

## 2016-07-14 DIAGNOSIS — I4821 Permanent atrial fibrillation: Secondary | ICD-10-CM

## 2016-07-14 DIAGNOSIS — N183 Chronic kidney disease, stage 3 unspecified: Secondary | ICD-10-CM

## 2016-07-14 DIAGNOSIS — D62 Acute posthemorrhagic anemia: Secondary | ICD-10-CM | POA: Diagnosis not present

## 2016-07-14 DIAGNOSIS — F32A Depression, unspecified: Secondary | ICD-10-CM

## 2016-07-14 DIAGNOSIS — I749 Embolism and thrombosis of unspecified artery: Secondary | ICD-10-CM | POA: Diagnosis not present

## 2016-07-14 NOTE — Progress Notes (Signed)
Patient ID: Cesar [name], male   DOB: 02-24-1929, 82 y.o.   MRN: [id number]  Location:  [facility] Room Number: 100 P Place of Service:  SNF (31)  PCP: Dwan Bolt, MD Patient Care Team: Anda Kraft, MD as PCP - General (Endocrinology)  Extended Emergency Contact Information Primary Emergency Contact: Fehl,[name] Address: [address]          [place], [place] [address] [place] Phone: [phone] Relation: Spouse  Allergies  Allergen Reactions  . Flomax [Tamsulosin Hcl] Other (See Comments)    Can't pee    Chief Complaint  Patient presents with  . Discharge Note    HPI:  82 y.o. male with chronic atrial fibrillation. Had complications of hematuria and therefore had his Coumadin stopped approximately 5 days ago. Went to bed and awoke up unable to stand due to weakness and pain in his left leg and was brought to the emergency department by EMS. He has no prior history of peripheral vascular occlusive disease. No prior history of stroke. Pt was [facility] to [facility] form 9/28-10/2 where he was dx with L leg ischemia and underwent an embolectomy of the external iliac, common femoral and profundus femoral arteries. Post -op period was unremarkable. Pt's coumadin was started post -op day 1. Pt was admitted to SNF for OT/PT and is now ready to be d/c to home.    Past Medical History:  Diagnosis Date  . AAA (abdominal aortic aneurysm) ([place])   . Aortic stenosis    a. mild-mod by echo 06/2016.  . Ascending aortic aneurysm ([place])   . Atrial fibrillation ([place])   . CKD (chronic kidney disease), stage III    baseline Cr 1.4-1.5 in 2016-2017 by PCP)  . CVA (cerebral infarction)   . Depression   . DVT (deep venous thrombosis) ([place]) 2006  . Dyslipidemia   . Hematuria   . HOH (hard of hearing)   . Hypercholesterolemia   . OSA (obstructive sleep apnea)    intol to CPAP  . Permanent atrial fibrillation ([place])   . Prostate  enlargement   . Pulmonary nodules     Past Surgical History:  Procedure Laterality Date  . EMBOLECTOMY Left 07/02/2016   Procedure: EMBOLECTOMY LEFT FEMORAL ARTERY;  Surgeon: [name], MD;  Location: [facility];  Service: Vascular;  Laterality: Left;  . NM MYOCAR PERF WALL MOTION  08/24/2002   markedly positive,subtle lateral ischemia  . PROSTATE SURGERY    . US ECHOCARDIOGRAPHY  01/06/2012   mod. AOV ca+,mild to mod. AS,mod mostly posterior MAC-mild MR     reports that he quit smoking about 63 years ago. His smoking use included Cigarettes. He has a 10.00 pack-year smoking history. He quit smokeless tobacco use about 39 years ago. He reports that he drinks alcohol. He reports that he does not use drugs. Social History   Social History  . Marital status: Married    Spouse name: N/A  . Number of children: 2  . Years of education: N/A   Occupational History  . Not on file.   Social History Main Topics  . Smoking status: Former Smoker    Packs/day: 1.00    Years: 10.00    Types: Cigarettes    Quit date: 10/05/1952  . Smokeless tobacco: Former Systems developer    Quit date: 10/05/1976  . Alcohol use 0.0 oz/week  . Drug use: No  . Sexual activity: No   Other Topics Concern  .  Not on file   Social History Narrative  . No narrative on file    Pertinent  Health Maintenance Due  Topic Date Due  . PNA vac Low Risk Adult (1 of 2 - PCV13) 06/29/1996  . INFLUENZA VACCINE  05/05/2016    Medications:   Medication List       Accurate as of 07/14/16 11:59 PM. Always use your most recent med list.          docusate sodium 100 MG capsule Commonly known as:  COLACE Take 100 mg by mouth daily.   escitalopram 10 MG tablet Commonly known as:  LEXAPRO Take 10 mg by mouth daily.   finasteride 5 MG tablet Commonly known as:  PROSCAR Take 5 mg by mouth daily.   metoprolol succinate 25 MG 24 hr tablet Commonly known as:  TOPROL-XL Take 1 tablet (25 mg total) by mouth daily.     oxyCODONE-acetaminophen 5-325 MG tablet Commonly known as:  PERCOCET/ROXICET Take 1 tablet by mouth every 6 (six) hours as needed for severe pain.   pantoprazole 40 MG tablet Commonly known as:  PROTONIX Take 40 mg by mouth daily.   warfarin 6 MG tablet Commonly known as:  COUMADIN Take 6 mg by mouth daily.        Vitals:   07/14/16 1434  Pulse: 68  Resp: 20  Weight: 220 lb (99.8 kg)   Body mass index is 29.03 kg/m.  Physical Exam  GENERAL APPEARANCE: Alert,. No acute distress.  HEENT: Unremarkable. RESPIRATORY: Breathing is even, unlabored. Lung sounds are clear   CARDIOVASCULAR: Heart irreg no murmurs, rubs or gallops. No peripheral edema.  GASTROINTESTINAL: Abdomen is soft, non-tender, not distended w/ normal bowel sounds.  NEUROLOGIC: Cranial nerves 2-12 grossly intact. Moves all extremities   Labs reviewed: Basic Metabolic Panel:  Recent Labs  07/02/16 0350 07/03/16 0227 07/05/16 07/05/16 0252 07/10/16  NA 141 138 138 138 139  K 3.9 4.2  --  3.8 4.5  CL 110 107  --  107  --   CO2 22 24  --  25  --   GLUCOSE 104* 102*  --  113*  --   BUN 13 14 19 19 20   CREATININE 1.56* 1.57* 1.5* 1.47* 1.5*  CALCIUM 8.6* 8.0*  --  8.6*  --    No results found for: [facility] Liver Function Tests:  Recent Labs  07/02/16 0350  AST 20  ALT 15*  ALKPHOS 42  BILITOT 0.6  PROT 6.4*  ALBUMIN 3.2*   No results for input(s): LIPASE, AMYLASE in the last 8760 hours. No results for input(s): AMMONIA in the last 8760 hours. CBC:  Recent Labs  07/02/16 0350  07/04/16 0449 07/05/16 0252 07/06/16 07/06/16 0345 07/10/16  WBC 6.4  < > 7.8 7.0 6.5 6.5 5.7  NEUTROABS 4.7  --   --   --   --   --   --   HGB 12.3*  < > 11.1* 10.6*  --  10.0* 9.9*  HCT 37.5*  < > 34.6* 33.4*  --  31.3* 29*  MCV 95.4  < > 96.9 97.4  --  96.9  --   PLT 160  < > 171 164  --  177 254  < > = values in this interval not displayed. Lipid No results for input(s): CHOL, HDL, LDLCALC, TRIG in  the last 8760 hours. Cardiac Enzymes:  Recent Labs  07/02/16 0350  CKTOTAL 133   BNP: No results for input(s): BNP  in the last 8760 hours. CBG: No results for input(s): GLUCAP in the last 8760 hours.  Procedures and Imaging Studies During Stay: Ct Angio Ao+bifem W &/or Wo Contrast  Result Date: 07/02/2016 CLINICAL DATA:  82 year old male with left leg weakness and difficulty using left leg. EXAM: CT ANGIOGRAPHY OF ABDOMINAL AORTA WITH ILIOFEMORAL RUNOFF TECHNIQUE: Multidetector CT imaging of the abdomen, pelvis and lower extremities was performed using the standard protocol during bolus administration of intravenous contrast. Multiplanar CT image reconstructions and MIPs were obtained to evaluate the vascular anatomy. CONTRAST:  80 cc Isovue 370 COMPARISON:  CT of the abdomen pelvis dated 04/22/2015 FINDINGS: The visualized lung bases are clear.  Mild cardiomegaly. No intra-abdominal free air or free fluid. The liver, gallbladder, pancreas, spleen, and the adrenal glands appear unremarkable. Grossly stable appearing left renal inferior pole 15 cm cyst. Smaller left renal hypodense lesions are not well characterized but likely represent cysts. There is moderate bilateral renal parenchymal atrophy. Punctate nonobstructing right renal upper pole calculus versus vascular calcification. There is no hydronephrosis on either side. The visualized ureters and urinary bladder appear unremarkable. Mild enlargement of the prostate gland measuring 7 cm in greatest transverse axial dimension. Scattered sigmoid diverticula without active inflammatory changes. There is moderate stool throughout the colon. There is no evidence of bowel obstruction or active inflammation. Normal appendix. There is moderate aortoiliac atherosclerotic disease. There is no aneurysmal dilatation or dissection of the abdominal aorta. The origins of the celiac axis, SMA, IMA as well as the origins of the renal arteries remain patent. There  is a classic celiac axis branching anatomy. There is atherosclerotic calcification of the common iliac arteries. No aneurysmal distention or dissection. LEFT LEG: There is complete occlusion of the distal left external iliac artery and common femoral artery. There is high-grade occlusion of the origin of the profundus femoris artery. There is clot with partial occlusion of the majority of the profundus femoris artery. There is high-grade narrowing of the left superficial femoral artery with very minimal flow in the proximal portion of the SFA. There is near complete occlusion of the midportion of the SFA. There is high-grade occlusion of the distal SFA with reconstitution of minimal flow. There is high-grade occlusion of the popliteal artery with minimal flow. There is advanced atherosclerotic disease of the calf arteries. No flow identified in the distal popliteal artery or in the calf arteries. No flow seen in the plantar artery or dorsalis pedis artery. RIGHT LEG: The right common femoral artery, deep and superficial femoral arteries, and the popliteal artery appear patent. There is atherosclerotic calcification of the anterior and posterior tibial arteries as well as the peroneal artery. There is diminished flow within the calf arteries of the right lower extremity. There is very diminished flow or absent flow in the anterior and posterior tibial arteries as well as the peroneal artery distally. No significant flow identified in the dorsalis pedis or plantar artery of the right foot on this CT. Evaluation however is limited due to extensive atherosclerotic calcification of the calf arteries and arteries of the foot. Correlation with clinical exam and duplex ultrasound recommended. IMPRESSION: LEFT LEG: Near complete occlusion of the distal external iliac, and common femoral artery on the left with extensive high-grade narrowing of the left superficial femoral artery and near complete occlusion of the midportion  of the SFA. High-grade narrowing of the distal SFA and popliteal artery with very diminished flow. Essentially no flow is identified in the left lower extremity below the  knee. There is advanced atherosclerotic disease of the arteries of the calf. RIGHT LEG: Patent femoral and popliteal arteries. Advanced atherosclerotic calcification of the arteries of the calf and foot with diminished flow within this vessels. Minimal or absent flow in the arteries of the right lower extremity at and distal to the ankle. Evaluation however is somewhat limited due to advanced atherosclerotic disease. Correlation with clinical exam and duplex ultrasound recommended. No acute intra-abdominal or pelvic pathology identified. Moderate to advanced aortoiliac atherosclerotic disease. No aneurysmal dilatation or dissection. These results were called by telephone at the time of interpretation on 07/02/2016 at 6:23 am to Dr. Joseph Berkshire , who verbally acknowledged these results. Electronically Signed   By: Anner Crete M.D.   On: 07/02/2016 06:33    Assessment/Plan:   Ischemia of lower extremity  Embolism (HCC)  Permanent atrial fibrillation (HCC)  CKD (chronic kidney disease), stage III  Benign prostatic hyperplasia without lower urinary tract symptoms  Depression, unspecified depression type  Acute blood loss anemia   Patient is being discharged with the following home health services:  HH/OT/PT/nursing  Patient is being discharged with the following durable medical equipment:  Rolling walker  Patient has been advised to f/u with their PCP in 1-2 weeks to bring them up to date on their rehab stay.  Social services at facility was responsible for arranging this appointment.  Pt was provided with a 30 day supply of prescriptions for medications and refills must be obtained from their PCP.  For controlled substances, a more limited supply may be provided adequate until PCP appointment only.  Future  labs/tests needed:   Inocencio Homes, MD

## 2016-07-16 ENCOUNTER — Encounter: Payer: Self-pay | Admitting: Vascular Surgery

## 2016-07-16 DIAGNOSIS — Z48812 Encounter for surgical aftercare following surgery on the circulatory system: Secondary | ICD-10-CM | POA: Diagnosis not present

## 2016-07-16 DIAGNOSIS — I4891 Unspecified atrial fibrillation: Secondary | ICD-10-CM | POA: Diagnosis not present

## 2016-07-16 DIAGNOSIS — E785 Hyperlipidemia, unspecified: Secondary | ICD-10-CM | POA: Diagnosis not present

## 2016-07-16 DIAGNOSIS — M6281 Muscle weakness (generalized): Secondary | ICD-10-CM | POA: Diagnosis not present

## 2016-07-16 DIAGNOSIS — I129 Hypertensive chronic kidney disease with stage 1 through stage 4 chronic kidney disease, or unspecified chronic kidney disease: Secondary | ICD-10-CM | POA: Diagnosis not present

## 2016-07-16 DIAGNOSIS — G4733 Obstructive sleep apnea (adult) (pediatric): Secondary | ICD-10-CM | POA: Diagnosis not present

## 2016-07-16 DIAGNOSIS — Z7901 Long term (current) use of anticoagulants: Secondary | ICD-10-CM | POA: Diagnosis not present

## 2016-07-16 DIAGNOSIS — I714 Abdominal aortic aneurysm, without rupture: Secondary | ICD-10-CM | POA: Diagnosis not present

## 2016-07-16 DIAGNOSIS — Z4800 Encounter for change or removal of nonsurgical wound dressing: Secondary | ICD-10-CM | POA: Diagnosis not present

## 2016-07-16 DIAGNOSIS — N183 Chronic kidney disease, stage 3 (moderate): Secondary | ICD-10-CM | POA: Diagnosis not present

## 2016-07-16 DIAGNOSIS — Z5181 Encounter for therapeutic drug level monitoring: Secondary | ICD-10-CM | POA: Diagnosis not present

## 2016-07-16 DIAGNOSIS — R2689 Other abnormalities of gait and mobility: Secondary | ICD-10-CM | POA: Diagnosis not present

## 2016-07-16 DIAGNOSIS — I745 Embolism and thrombosis of iliac artery: Secondary | ICD-10-CM | POA: Diagnosis not present

## 2016-07-16 DIAGNOSIS — F329 Major depressive disorder, single episode, unspecified: Secondary | ICD-10-CM | POA: Diagnosis not present

## 2016-07-16 DIAGNOSIS — Z87891 Personal history of nicotine dependence: Secondary | ICD-10-CM | POA: Diagnosis not present

## 2016-07-16 DIAGNOSIS — I743 Embolism and thrombosis of arteries of the lower extremities: Secondary | ICD-10-CM | POA: Diagnosis not present

## 2016-07-17 ENCOUNTER — Telehealth: Payer: Self-pay | Admitting: *Deleted

## 2016-07-17 DIAGNOSIS — I743 Embolism and thrombosis of arteries of the lower extremities: Secondary | ICD-10-CM | POA: Diagnosis not present

## 2016-07-17 DIAGNOSIS — I4891 Unspecified atrial fibrillation: Secondary | ICD-10-CM | POA: Diagnosis not present

## 2016-07-17 DIAGNOSIS — Z5181 Encounter for therapeutic drug level monitoring: Secondary | ICD-10-CM | POA: Diagnosis not present

## 2016-07-17 DIAGNOSIS — F329 Major depressive disorder, single episode, unspecified: Secondary | ICD-10-CM | POA: Diagnosis not present

## 2016-07-17 DIAGNOSIS — I745 Embolism and thrombosis of iliac artery: Secondary | ICD-10-CM | POA: Diagnosis not present

## 2016-07-17 DIAGNOSIS — Z48812 Encounter for surgical aftercare following surgery on the circulatory system: Secondary | ICD-10-CM | POA: Diagnosis not present

## 2016-07-17 NOTE — Telephone Encounter (Signed)
Received call from [place] with [facility].  She states that PT  INR  is 1.3 today.  Upon reviewing history, patient had been followed by Dr Sheppard Coil at [facility].  Patient was discharged from [facility] 07/14/16 to home.  I called [facility] and left a message to call concerning the management of patients Coumadin.  I called the patient and spoke to his wife, Cesar [name].  I explained that Dr Wilson Singer had patient on coumadin for irregular heart rhythm.  In trying to assist her it was decided she'd call [facility] as Dr Eugenio Hoes office closed at 1;00.  Cesar [name] calls me back stating that she had called Dr America Brown and spoke to "someone" who was able to reach Dr Wilson Singer and was told to give patient 7.5 mg of coumadin until Monday until they could speak to [name].

## 2016-07-21 ENCOUNTER — Ambulatory Visit (INDEPENDENT_AMBULATORY_CARE_PROVIDER_SITE_OTHER): Payer: Medicare Other | Admitting: Vascular Surgery

## 2016-07-21 ENCOUNTER — Encounter: Payer: Self-pay | Admitting: Vascular Surgery

## 2016-07-21 VITALS — BP 140/79 | HR 86 | Temp 97.5°F | Resp 18 | Ht 73.0 in | Wt 216.8 lb

## 2016-07-21 DIAGNOSIS — I745 Embolism and thrombosis of iliac artery: Secondary | ICD-10-CM

## 2016-07-21 DIAGNOSIS — I743 Embolism and thrombosis of arteries of the lower extremities: Secondary | ICD-10-CM | POA: Diagnosis not present

## 2016-07-21 DIAGNOSIS — Z5181 Encounter for therapeutic drug level monitoring: Secondary | ICD-10-CM | POA: Diagnosis not present

## 2016-07-21 DIAGNOSIS — F329 Major depressive disorder, single episode, unspecified: Secondary | ICD-10-CM | POA: Diagnosis not present

## 2016-07-21 DIAGNOSIS — I4891 Unspecified atrial fibrillation: Secondary | ICD-10-CM | POA: Diagnosis not present

## 2016-07-21 DIAGNOSIS — Z48812 Encounter for surgical aftercare following surgery on the circulatory system: Secondary | ICD-10-CM | POA: Diagnosis not present

## 2016-07-21 NOTE — Progress Notes (Signed)
   Patient name: TISHAWN SCHAEFER MRN: [id number] DOB: [birth year] Sex: male  REASON FOR VISIT: Here today for follow-up of left femoral embolectomy. It presented with acute ischemia of his left lower extremity on 07/02/2016. He been off his Coumadin for several days for another surgical procedure. He underwent emergent exploration and embolectomy of his iliac femoral and superficial femoral artery. His Fogarty catheter passed down to the level of his foot. He did well the hospital discharge to home. 2-D echocardiogram showed no evidence of cardiogenic's source.    Current Outpatient Prescriptions  Medication Sig Dispense Refill  . escitalopram (LEXAPRO) 10 MG tablet Take 10 mg by mouth daily.    . finasteride (PROSCAR) 5 MG tablet Take 5 mg by mouth daily.    Marland Kitchen warfarin (COUMADIN) 6 MG tablet Take 6 mg by mouth daily.    Marland Kitchen docusate sodium (COLACE) 100 MG capsule Take 100 mg by mouth daily.    . metoprolol succinate (TOPROL-XL) 25 MG 24 hr tablet Take 1 tablet (25 mg total) by mouth daily. (Patient not taking: Reported on 07/21/2016) 30 tablet 0  . oxyCODONE-acetaminophen (PERCOCET/ROXICET) 5-325 MG tablet Take 1 tablet by mouth every 6 (six) hours as needed for severe pain.     . pantoprazole (PROTONIX) 40 MG tablet Take 40 mg by mouth daily.     No current facility-administered medications for this visit.      PHYSICAL EXAM: Vitals:   07/21/16 1356  BP: 140/79  Pulse: 86  Resp: 18  Temp: 97.5 F (36.4 C)  TempSrc: Oral  SpO2: 96%  Weight: 216 lb 12.8 oz (98.3 kg)  Height: 6\' 1"  (1.854 m)    GENERAL: The patient is a well-nourished male, in no acute distress. The vital signs are documented above. Left groin wound healing quite nicely with typical healing ridge and femoral pulse. He does have a 2-3+ popliteal pulse. I do not palpate pedal pulses bilaterally. His feet are cool and somewhat cyanotic bilaterally.     Audible Doppler flow in his posterior  tibial and anterior tibial and peroneal on the left.  MEDICAL ISSUES: Stable overall. We'll continue his anticoagulation therapy. We'll see him again in 3 months for final follow-up. Will obtain lower extremity arterial Doppler studies at that time   [name], MD [facility] Tel [phone] Pager [phone]

## 2016-07-22 DIAGNOSIS — F329 Major depressive disorder, single episode, unspecified: Secondary | ICD-10-CM | POA: Diagnosis not present

## 2016-07-22 DIAGNOSIS — I743 Embolism and thrombosis of arteries of the lower extremities: Secondary | ICD-10-CM | POA: Diagnosis not present

## 2016-07-22 DIAGNOSIS — I745 Embolism and thrombosis of iliac artery: Secondary | ICD-10-CM | POA: Diagnosis not present

## 2016-07-22 DIAGNOSIS — Z48812 Encounter for surgical aftercare following surgery on the circulatory system: Secondary | ICD-10-CM | POA: Diagnosis not present

## 2016-07-22 DIAGNOSIS — I4891 Unspecified atrial fibrillation: Secondary | ICD-10-CM | POA: Diagnosis not present

## 2016-07-22 DIAGNOSIS — Z5181 Encounter for therapeutic drug level monitoring: Secondary | ICD-10-CM | POA: Diagnosis not present

## 2016-07-24 DIAGNOSIS — Z48812 Encounter for surgical aftercare following surgery on the circulatory system: Secondary | ICD-10-CM | POA: Diagnosis not present

## 2016-07-24 DIAGNOSIS — I743 Embolism and thrombosis of arteries of the lower extremities: Secondary | ICD-10-CM | POA: Diagnosis not present

## 2016-07-24 DIAGNOSIS — I4891 Unspecified atrial fibrillation: Secondary | ICD-10-CM | POA: Diagnosis not present

## 2016-07-24 DIAGNOSIS — F329 Major depressive disorder, single episode, unspecified: Secondary | ICD-10-CM | POA: Diagnosis not present

## 2016-07-24 DIAGNOSIS — Z5181 Encounter for therapeutic drug level monitoring: Secondary | ICD-10-CM | POA: Diagnosis not present

## 2016-07-24 DIAGNOSIS — I745 Embolism and thrombosis of iliac artery: Secondary | ICD-10-CM | POA: Diagnosis not present

## 2016-07-27 DIAGNOSIS — I743 Embolism and thrombosis of arteries of the lower extremities: Secondary | ICD-10-CM | POA: Diagnosis not present

## 2016-07-27 DIAGNOSIS — F329 Major depressive disorder, single episode, unspecified: Secondary | ICD-10-CM | POA: Diagnosis not present

## 2016-07-27 DIAGNOSIS — Z48812 Encounter for surgical aftercare following surgery on the circulatory system: Secondary | ICD-10-CM | POA: Diagnosis not present

## 2016-07-27 DIAGNOSIS — I4891 Unspecified atrial fibrillation: Secondary | ICD-10-CM | POA: Diagnosis not present

## 2016-07-27 DIAGNOSIS — Z5181 Encounter for therapeutic drug level monitoring: Secondary | ICD-10-CM | POA: Diagnosis not present

## 2016-07-27 DIAGNOSIS — I745 Embolism and thrombosis of iliac artery: Secondary | ICD-10-CM | POA: Diagnosis not present

## 2016-07-28 DIAGNOSIS — T887XXA Unspecified adverse effect of drug or medicament, initial encounter: Secondary | ICD-10-CM | POA: Diagnosis not present

## 2016-07-28 DIAGNOSIS — I482 Chronic atrial fibrillation: Secondary | ICD-10-CM | POA: Diagnosis not present

## 2016-07-29 DIAGNOSIS — I4891 Unspecified atrial fibrillation: Secondary | ICD-10-CM | POA: Diagnosis not present

## 2016-07-29 DIAGNOSIS — I743 Embolism and thrombosis of arteries of the lower extremities: Secondary | ICD-10-CM | POA: Diagnosis not present

## 2016-07-29 DIAGNOSIS — F329 Major depressive disorder, single episode, unspecified: Secondary | ICD-10-CM | POA: Diagnosis not present

## 2016-07-29 DIAGNOSIS — Z48812 Encounter for surgical aftercare following surgery on the circulatory system: Secondary | ICD-10-CM | POA: Diagnosis not present

## 2016-07-29 DIAGNOSIS — Z5181 Encounter for therapeutic drug level monitoring: Secondary | ICD-10-CM | POA: Diagnosis not present

## 2016-07-29 DIAGNOSIS — I745 Embolism and thrombosis of iliac artery: Secondary | ICD-10-CM | POA: Diagnosis not present

## 2016-07-30 DIAGNOSIS — I482 Chronic atrial fibrillation: Secondary | ICD-10-CM | POA: Diagnosis not present

## 2016-07-30 DIAGNOSIS — Z7901 Long term (current) use of anticoagulants: Secondary | ICD-10-CM | POA: Diagnosis not present

## 2016-07-31 DIAGNOSIS — Z48812 Encounter for surgical aftercare following surgery on the circulatory system: Secondary | ICD-10-CM | POA: Diagnosis not present

## 2016-07-31 DIAGNOSIS — Z5181 Encounter for therapeutic drug level monitoring: Secondary | ICD-10-CM | POA: Diagnosis not present

## 2016-07-31 DIAGNOSIS — I745 Embolism and thrombosis of iliac artery: Secondary | ICD-10-CM | POA: Diagnosis not present

## 2016-07-31 DIAGNOSIS — F329 Major depressive disorder, single episode, unspecified: Secondary | ICD-10-CM | POA: Diagnosis not present

## 2016-07-31 DIAGNOSIS — I4891 Unspecified atrial fibrillation: Secondary | ICD-10-CM | POA: Diagnosis not present

## 2016-07-31 DIAGNOSIS — I743 Embolism and thrombosis of arteries of the lower extremities: Secondary | ICD-10-CM | POA: Diagnosis not present

## 2016-08-03 DIAGNOSIS — I743 Embolism and thrombosis of arteries of the lower extremities: Secondary | ICD-10-CM | POA: Diagnosis not present

## 2016-08-03 DIAGNOSIS — Z5181 Encounter for therapeutic drug level monitoring: Secondary | ICD-10-CM | POA: Diagnosis not present

## 2016-08-03 DIAGNOSIS — I4891 Unspecified atrial fibrillation: Secondary | ICD-10-CM | POA: Diagnosis not present

## 2016-08-03 DIAGNOSIS — Z48812 Encounter for surgical aftercare following surgery on the circulatory system: Secondary | ICD-10-CM | POA: Diagnosis not present

## 2016-08-03 DIAGNOSIS — F329 Major depressive disorder, single episode, unspecified: Secondary | ICD-10-CM | POA: Diagnosis not present

## 2016-08-03 DIAGNOSIS — I745 Embolism and thrombosis of iliac artery: Secondary | ICD-10-CM | POA: Diagnosis not present

## 2016-08-05 DIAGNOSIS — I4891 Unspecified atrial fibrillation: Secondary | ICD-10-CM | POA: Diagnosis not present

## 2016-08-05 DIAGNOSIS — Z5181 Encounter for therapeutic drug level monitoring: Secondary | ICD-10-CM | POA: Diagnosis not present

## 2016-08-05 DIAGNOSIS — Z48812 Encounter for surgical aftercare following surgery on the circulatory system: Secondary | ICD-10-CM | POA: Diagnosis not present

## 2016-08-05 DIAGNOSIS — F329 Major depressive disorder, single episode, unspecified: Secondary | ICD-10-CM | POA: Diagnosis not present

## 2016-08-05 DIAGNOSIS — I745 Embolism and thrombosis of iliac artery: Secondary | ICD-10-CM | POA: Diagnosis not present

## 2016-08-05 DIAGNOSIS — I743 Embolism and thrombosis of arteries of the lower extremities: Secondary | ICD-10-CM | POA: Diagnosis not present

## 2016-08-10 DIAGNOSIS — I4891 Unspecified atrial fibrillation: Secondary | ICD-10-CM | POA: Diagnosis not present

## 2016-08-10 DIAGNOSIS — I745 Embolism and thrombosis of iliac artery: Secondary | ICD-10-CM | POA: Diagnosis not present

## 2016-08-10 DIAGNOSIS — I743 Embolism and thrombosis of arteries of the lower extremities: Secondary | ICD-10-CM | POA: Diagnosis not present

## 2016-08-10 DIAGNOSIS — Z48812 Encounter for surgical aftercare following surgery on the circulatory system: Secondary | ICD-10-CM | POA: Diagnosis not present

## 2016-08-10 DIAGNOSIS — F329 Major depressive disorder, single episode, unspecified: Secondary | ICD-10-CM | POA: Diagnosis not present

## 2016-08-10 DIAGNOSIS — Z5181 Encounter for therapeutic drug level monitoring: Secondary | ICD-10-CM | POA: Diagnosis not present

## 2016-08-31 DIAGNOSIS — Z7901 Long term (current) use of anticoagulants: Secondary | ICD-10-CM | POA: Diagnosis not present

## 2016-08-31 DIAGNOSIS — I482 Chronic atrial fibrillation: Secondary | ICD-10-CM | POA: Diagnosis not present

## 2016-10-09 ENCOUNTER — Other Ambulatory Visit: Payer: Self-pay

## 2016-10-09 DIAGNOSIS — I739 Peripheral vascular disease, unspecified: Secondary | ICD-10-CM

## 2016-10-12 DIAGNOSIS — I482 Chronic atrial fibrillation: Secondary | ICD-10-CM | POA: Diagnosis not present

## 2016-10-12 DIAGNOSIS — Z7901 Long term (current) use of anticoagulants: Secondary | ICD-10-CM | POA: Diagnosis not present

## 2016-10-20 ENCOUNTER — Encounter: Payer: Self-pay | Admitting: Vascular Surgery

## 2016-10-27 ENCOUNTER — Ambulatory Visit (INDEPENDENT_AMBULATORY_CARE_PROVIDER_SITE_OTHER): Payer: Medicare Other | Admitting: Vascular Surgery

## 2016-10-27 ENCOUNTER — Encounter: Payer: Self-pay | Admitting: Vascular Surgery

## 2016-10-27 ENCOUNTER — Ambulatory Visit (HOSPITAL_COMMUNITY)
Admission: RE | Admit: 2016-10-27 | Discharge: 2016-10-27 | Disposition: A | Discharge status: Home / Self Care | Payer: Medicare Other | Source: Ambulatory Visit | Attending: Vascular Surgery | Admitting: Vascular Surgery

## 2016-10-27 VITALS — BP 119/80 | HR 95 | Temp 97.7°F | Resp 16 | Ht 73.0 in | Wt 219.0 lb

## 2016-10-27 DIAGNOSIS — I739 Peripheral vascular disease, unspecified: Secondary | ICD-10-CM

## 2016-10-27 DIAGNOSIS — I745 Embolism and thrombosis of iliac artery: Secondary | ICD-10-CM

## 2016-10-27 DIAGNOSIS — R9439 Abnormal result of other cardiovascular function study: Secondary | ICD-10-CM | POA: Insufficient documentation

## 2016-10-27 NOTE — Progress Notes (Signed)
[facility] [place]  Patient name: Cesar [name] MRN: [id number] DOB: 1928-12-17 Sex: male  REASON FOR VISIT: Follow-up left iliac embolectomy on 07/02/2016  HPI: Cesar [name] is a 83 y.o. male who presented emergently with acute ischemia of his left leg with the atrial embolus. He did well after surgery and was discharged home. He is here today with his wife. He has severe hearing loss. He does report that he is back to his baseline as far as walking goes. He has no groin soreness from his incision.  Past Medical History:  Diagnosis Date  . AAA (abdominal aortic aneurysm) ([place])   . Aortic stenosis    a. mild-mod by echo 06/2016.  . Ascending aortic aneurysm ([place])   . Atrial fibrillation ([place])   . CKD (chronic kidney disease), stage III    baseline Cr 1.4-1.5 in 2016-2017 by PCP)  . CVA (cerebral infarction)   . Depression   . DVT (deep venous thrombosis) ([place]) 2006  . Dyslipidemia   . Hematuria   . HOH (hard of hearing)   . Hypercholesterolemia   . OSA (obstructive sleep apnea)    intol to CPAP  . Permanent atrial fibrillation ([place])   . Prostate enlargement   . Pulmonary nodules     Family History  Problem Relation Age of Onset  . Heart failure Mother   . Diabetes Father   . Stroke Father     SOCIAL HISTORY: Social History  Substance Use Topics  . Smoking status: Former Smoker    Packs/day: 1.00    Years: 10.00    Types: Cigarettes    Quit date: 10/05/1952  . Smokeless tobacco: Former Systems developer    Quit date: 10/05/1976  . Alcohol use 0.0 oz/week    Allergies  Allergen Reactions  . Flomax [Tamsulosin Hcl] Other (See Comments)    Can't pee    Current Outpatient Prescriptions  Medication Sig Dispense Refill  . escitalopram (LEXAPRO) 10 MG tablet Take 10 mg by mouth daily.    . finasteride (PROSCAR) 5 MG tablet Take 5 mg by mouth daily.    Marland Kitchen warfarin (COUMADIN) 6 MG tablet Take 6 mg by mouth daily.    Marland Kitchen  docusate sodium (COLACE) 100 MG capsule Take 100 mg by mouth daily.    . metoprolol succinate (TOPROL-XL) 25 MG 24 hr tablet Take 1 tablet (25 mg total) by mouth daily. (Patient not taking: Reported on 07/21/2016) 30 tablet 0  . oxyCODONE-acetaminophen (PERCOCET/ROXICET) 5-325 MG tablet Take 1 tablet by mouth every 6 (six) hours as needed for severe pain.     . pantoprazole (PROTONIX) 40 MG tablet Take 40 mg by mouth daily.     No current facility-administered medications for this visit.     REVIEW OF SYSTEMS:  [X]  denotes positive finding, [ ]  denotes negative finding Cardiac  Comments:  Chest pain or chest pressure:    Shortness of breath upon exertion:    Short of breath when lying flat:    Irregular heart rhythm:        Vascular    Pain in calf, thigh, or hip brought on by ambulation:    Pain in feet at night that wakes you up from your sleep:     Blood clot in your veins:    Leg swelling:           PHYSICAL EXAM: Vitals:   10/27/16 1257  BP: 119/80  Pulse: 95  Resp: 16  Temp: 97.7 F (36.5 C)  SpO2: 97%  Weight: 219 lb (99.3 kg)  Height: 6\' 1"  (1.854 m)    GENERAL: The patient is a well-nourished male, in no acute distress. The vital signs are documented above. CARDIOVASCULAR: Palpable radial and 2+ femoral pulses bilaterally. His left groin wound is completely healed PULMONARY: There is good air exchange  MUSCULOSKELETAL: There are no major deformities or cyanosis. NEUROLOGIC: No focal weakness or paresthesias are detected. SKIN: There are no ulcers or rashes noted. PSYCHIATRIC: The patient has a normal affect.  DATA:  Noninvasive studies today reveal ankle arm index of 1.0 on the right and 0.82 on the left with biphasic flavor waveforms in his left posterior tibial artery  MEDICAL ISSUES: Stable recovery after embolectomy. He is maintained on anticoagulation therapy. He will continue his walking program. He will be seen on an as-needed basis. Is instructed to  let us know if he has any new ischemic symptoms.    [name], MD FACS [facility] of [facility] Tel [phone] Pager [phone]

## 2016-10-30 ENCOUNTER — Other Ambulatory Visit: Payer: Self-pay | Admitting: Surgery

## 2016-10-30 DIAGNOSIS — R918 Other nonspecific abnormal finding of lung field: Secondary | ICD-10-CM

## 2016-11-09 DIAGNOSIS — Z7901 Long term (current) use of anticoagulants: Secondary | ICD-10-CM | POA: Diagnosis not present

## 2016-11-09 DIAGNOSIS — I482 Chronic atrial fibrillation: Secondary | ICD-10-CM | POA: Diagnosis not present

## 2016-11-25 ENCOUNTER — Ambulatory Visit (INDEPENDENT_AMBULATORY_CARE_PROVIDER_SITE_OTHER): Payer: Medicare Other | Admitting: Surgery

## 2016-11-25 ENCOUNTER — Ambulatory Visit
Admission: RE | Admit: 2016-11-25 | Discharge: 2016-11-25 | Disposition: A | Discharge status: Home / Self Care | Payer: Medicare Other | Source: Ambulatory Visit | Attending: Surgery | Admitting: Surgery

## 2016-11-25 ENCOUNTER — Encounter: Payer: Self-pay | Admitting: Surgery

## 2016-11-25 VITALS — BP 114/70 | HR 110 | Resp 20 | Ht 73.0 in | Wt 220.0 lb

## 2016-11-25 DIAGNOSIS — R918 Other nonspecific abnormal finding of lung field: Secondary | ICD-10-CM

## 2016-11-25 DIAGNOSIS — I712 Thoracic aortic aneurysm, without rupture: Secondary | ICD-10-CM | POA: Diagnosis not present

## 2016-11-25 DIAGNOSIS — I7121 Aneurysm of the ascending aorta, without rupture: Secondary | ICD-10-CM

## 2016-11-25 DIAGNOSIS — I745 Embolism and thrombosis of iliac artery: Secondary | ICD-10-CM | POA: Diagnosis not present

## 2016-11-26 ENCOUNTER — Encounter: Payer: Self-pay | Admitting: Surgery

## 2016-11-26 NOTE — Progress Notes (Signed)
HPI:  The patient returns for follow up of a 4.5 cm fusiform ascending aortic aneurysm and two small right lung nodules. He is here with his wife and daughter. He is very hard of hearing. He has been feeling well overall.  Current Outpatient Prescriptions  Medication Sig Dispense Refill  . escitalopram (LEXAPRO) 10 MG tablet Take 10 mg by mouth daily.    . finasteride (PROSCAR) 5 MG tablet Take 5 mg by mouth daily.    Marland Kitchen warfarin (COUMADIN) 6 MG tablet Take 6 mg by mouth daily.    . metoprolol succinate (TOPROL-XL) 25 MG 24 hr tablet Take 1 tablet (25 mg total) by mouth daily. (Patient not taking: Reported on 07/21/2016) 30 tablet 0   No current facility-administered medications for this visit.      Physical Exam: BP 114/70   Pulse (!) 110   Resp 20   Ht 6\' 1"  (1.854 m)   Wt 220 lb (99.8 kg)   SpO2 97%   BMI 29.03 kg/m  He looks well  There is no cervical or supraclavicular adenopathy. Cardiac exam shows a regular rate and rhythm with normal heart sounds. There is a 2/6 systolic murmur at the RUSB. Lungs are clear.  Diagnostic Tests:  CLINICAL DATA:  History of lung nodules, followup, former smoking history  EXAM: CT CHEST WITHOUT CONTRAST  TECHNIQUE: Multidetector CT imaging of the chest was performed following the standard protocol without IV contrast.  COMPARISON:  CT chest of 05/27/2016 and 04/22/2015  FINDINGS: Cardiovascular: Diffuse coronary artery calcifications again are noted and cardiomegaly is stable. No pericardial effusion is seen. Calcification of the mitral annulus is present. The mid ascending thoracic aorta measures 45 mm in diameter compared to a 45 mm on the prior CT.  Mediastinum/Nodes: On this unenhanced study, no mediastinal or hilar adenopathy is seen with only small mediastinal lymph nodes present. The thyroid gland is unremarkable.  Lungs/Pleura: On lung window images, the nodule noted within the posterior meter right upper  lobe is stable in size measuring 5 mm in diameter. Also the nodule in the right middle lobe is stable measuring 6 mm in diameter. No new or enlarging pulmonary nodule is seen. No further followup is necessary in view of the stability since CT of 04/22/2015. No pneumonia is seen. There is no evidence of pleural effusion. The central airway is patent.  Upper Abdomen: The portion of the upper abdomen that is visualized is unremarkable within apparent cyst emanating from the upper pole of the left kidney. The gallbladder is contracted  Musculoskeletal: There is diffuse degenerative change throughout the thoracolumbar spine with slight kyphosis present.  IMPRESSION: 1. Stable small nodules in the right upper lobe and right middle lobe compared to CT from 04/22/2015. These are consistent with a benign process and no further followup is necessary. 2. Stable fusiform dilatation of the ascending thoracic aorta measuring 4.5 cm in diameter. Ascending thoracic aortic aneurysm. Recommend semi-annual imaging followup by CTA or MRA and referral to cardiothoracic surgery if not already obtained. This recommendation follows 2010 ACCF/AHA/AATS/ACR/ASA/SCA/SCAI/SIR/STS/SVM Guidelines for the Diagnosis and Management of Patients With Thoracic Aortic Disease. Circulation. 2010; [id number] .   Electronically Signed   By: Ivar Drape M.D.   On: 11/25/2016 13:56  Impression:  The small lung nodules in the RUL and RML are stable since 04/2015 and are considered benign. The 4.5 cm fusiform ascending aortic aneurysm is also stable and I think that given his age it would be  fine to check this again in 2 years assuming he is still doing fairly well. His BP is well controlled and he is on a beta blocker. I reviewed the CT images with him and his family and answered their questions.  Plan:  Follow up 2 years with CT chest without contrast.   Gaye Pollack, MD [facility] [phone]

## 2016-12-07 DIAGNOSIS — Z7901 Long term (current) use of anticoagulants: Secondary | ICD-10-CM | POA: Diagnosis not present

## 2016-12-07 DIAGNOSIS — I482 Chronic atrial fibrillation: Secondary | ICD-10-CM | POA: Diagnosis not present

## 2017-01-04 DIAGNOSIS — Z7901 Long term (current) use of anticoagulants: Secondary | ICD-10-CM | POA: Diagnosis not present

## 2017-01-04 DIAGNOSIS — I482 Chronic atrial fibrillation: Secondary | ICD-10-CM | POA: Diagnosis not present

## 2017-02-08 DIAGNOSIS — Z7901 Long term (current) use of anticoagulants: Secondary | ICD-10-CM | POA: Diagnosis not present

## 2017-02-08 DIAGNOSIS — I482 Chronic atrial fibrillation: Secondary | ICD-10-CM | POA: Diagnosis not present

## 2017-03-15 DIAGNOSIS — Z7901 Long term (current) use of anticoagulants: Secondary | ICD-10-CM | POA: Diagnosis not present

## 2017-03-15 DIAGNOSIS — I482 Chronic atrial fibrillation: Secondary | ICD-10-CM | POA: Diagnosis not present

## 2017-03-17 DIAGNOSIS — R899 Unspecified abnormal finding in specimens from other organs, systems and tissues: Secondary | ICD-10-CM | POA: Diagnosis not present

## 2017-03-17 DIAGNOSIS — L309 Dermatitis, unspecified: Secondary | ICD-10-CM | POA: Diagnosis not present

## 2017-03-17 DIAGNOSIS — L723 Sebaceous cyst: Secondary | ICD-10-CM | POA: Diagnosis not present

## 2017-03-17 DIAGNOSIS — N400 Enlarged prostate without lower urinary tract symptoms: Secondary | ICD-10-CM | POA: Diagnosis not present

## 2017-03-30 DIAGNOSIS — L720 Epidermal cyst: Secondary | ICD-10-CM | POA: Diagnosis not present

## 2017-03-30 DIAGNOSIS — D225 Melanocytic nevi of trunk: Secondary | ICD-10-CM | POA: Diagnosis not present

## 2017-04-19 DIAGNOSIS — Z7901 Long term (current) use of anticoagulants: Secondary | ICD-10-CM | POA: Diagnosis not present

## 2017-04-19 DIAGNOSIS — I482 Chronic atrial fibrillation: Secondary | ICD-10-CM | POA: Diagnosis not present

## 2017-05-03 DIAGNOSIS — N401 Enlarged prostate with lower urinary tract symptoms: Secondary | ICD-10-CM | POA: Diagnosis not present

## 2017-05-03 DIAGNOSIS — R351 Nocturia: Secondary | ICD-10-CM | POA: Diagnosis not present

## 2017-05-03 DIAGNOSIS — R310 Gross hematuria: Secondary | ICD-10-CM | POA: Diagnosis not present

## 2017-05-17 DIAGNOSIS — F329 Major depressive disorder, single episode, unspecified: Secondary | ICD-10-CM | POA: Diagnosis not present

## 2017-05-17 DIAGNOSIS — I712 Thoracic aortic aneurysm, without rupture: Secondary | ICD-10-CM | POA: Diagnosis not present

## 2017-05-17 DIAGNOSIS — Z7901 Long term (current) use of anticoagulants: Secondary | ICD-10-CM | POA: Diagnosis not present

## 2017-05-17 DIAGNOSIS — I481 Persistent atrial fibrillation: Secondary | ICD-10-CM | POA: Diagnosis not present

## 2017-05-17 DIAGNOSIS — Z87448 Personal history of other diseases of urinary system: Secondary | ICD-10-CM | POA: Diagnosis not present

## 2017-05-17 DIAGNOSIS — N400 Enlarged prostate without lower urinary tract symptoms: Secondary | ICD-10-CM | POA: Diagnosis not present

## 2017-05-17 DIAGNOSIS — D490 Neoplasm of unspecified behavior of digestive system: Secondary | ICD-10-CM | POA: Diagnosis not present

## 2017-05-18 DIAGNOSIS — L720 Epidermal cyst: Secondary | ICD-10-CM | POA: Diagnosis not present

## 2017-05-24 DIAGNOSIS — Z7901 Long term (current) use of anticoagulants: Secondary | ICD-10-CM | POA: Diagnosis not present

## 2017-05-26 DIAGNOSIS — H43813 Vitreous degeneration, bilateral: Secondary | ICD-10-CM | POA: Diagnosis not present

## 2017-05-26 DIAGNOSIS — H5213 Myopia, bilateral: Secondary | ICD-10-CM | POA: Diagnosis not present

## 2017-05-26 DIAGNOSIS — Z961 Presence of intraocular lens: Secondary | ICD-10-CM | POA: Diagnosis not present

## 2017-06-08 DIAGNOSIS — I481 Persistent atrial fibrillation: Secondary | ICD-10-CM | POA: Diagnosis not present

## 2017-06-08 DIAGNOSIS — Z7901 Long term (current) use of anticoagulants: Secondary | ICD-10-CM | POA: Diagnosis not present

## 2017-06-17 DIAGNOSIS — Z7901 Long term (current) use of anticoagulants: Secondary | ICD-10-CM | POA: Diagnosis not present

## 2017-06-17 DIAGNOSIS — F322 Major depressive disorder, single episode, severe without psychotic features: Secondary | ICD-10-CM | POA: Diagnosis not present

## 2017-06-17 DIAGNOSIS — I712 Thoracic aortic aneurysm, without rupture: Secondary | ICD-10-CM | POA: Diagnosis not present

## 2017-06-17 DIAGNOSIS — I4891 Unspecified atrial fibrillation: Secondary | ICD-10-CM | POA: Diagnosis not present

## 2017-06-17 DIAGNOSIS — I700 Atherosclerosis of aorta: Secondary | ICD-10-CM | POA: Diagnosis not present

## 2017-06-17 DIAGNOSIS — I6930 Unspecified sequelae of cerebral infarction: Secondary | ICD-10-CM | POA: Diagnosis not present

## 2017-06-17 DIAGNOSIS — Z131 Encounter for screening for diabetes mellitus: Secondary | ICD-10-CM | POA: Diagnosis not present

## 2017-06-17 DIAGNOSIS — Z0000 Encounter for general adult medical examination without abnormal findings: Secondary | ICD-10-CM | POA: Diagnosis not present

## 2017-06-17 DIAGNOSIS — N400 Enlarged prostate without lower urinary tract symptoms: Secondary | ICD-10-CM | POA: Diagnosis not present

## 2017-07-12 ENCOUNTER — Encounter: Payer: Self-pay | Admitting: Cardiovascular Disease

## 2017-07-12 ENCOUNTER — Ambulatory Visit (INDEPENDENT_AMBULATORY_CARE_PROVIDER_SITE_OTHER): Payer: Medicare Other | Admitting: Cardiovascular Disease

## 2017-07-12 VITALS — BP 110/80 | HR 93 | Ht 73.0 in | Wt 219.0 lb

## 2017-07-12 DIAGNOSIS — I712 Thoracic aortic aneurysm, without rupture: Secondary | ICD-10-CM

## 2017-07-12 DIAGNOSIS — Z7901 Long term (current) use of anticoagulants: Secondary | ICD-10-CM | POA: Diagnosis not present

## 2017-07-12 DIAGNOSIS — I739 Peripheral vascular disease, unspecified: Secondary | ICD-10-CM

## 2017-07-12 DIAGNOSIS — E7800 Pure hypercholesterolemia, unspecified: Secondary | ICD-10-CM

## 2017-07-12 DIAGNOSIS — I350 Nonrheumatic aortic (valve) stenosis: Secondary | ICD-10-CM | POA: Diagnosis not present

## 2017-07-12 DIAGNOSIS — I7121 Aneurysm of the ascending aorta, without rupture: Secondary | ICD-10-CM

## 2017-07-12 DIAGNOSIS — I4821 Permanent atrial fibrillation: Secondary | ICD-10-CM

## 2017-07-12 DIAGNOSIS — I482 Chronic atrial fibrillation: Secondary | ICD-10-CM | POA: Diagnosis not present

## 2017-07-12 DIAGNOSIS — I745 Embolism and thrombosis of iliac artery: Secondary | ICD-10-CM

## 2017-07-12 NOTE — Progress Notes (Signed)
Cardiology Office Note    Date:  07/12/2017   ID:  Cesar [name], DOB [birth year], MRN [id number]  PCP:  Orpah Melter, MD  Cardiologist:   [name], MD   Chief Complaint  Patient presents with  . Follow-up    History of Present Illness:  Cesar [name] is a 83 y.o. male with a moderate aneurysm of the ascending aorta (stable at 4.5 cm in size on serial CT), mild aortic stenosis, permanent atrial fibrillation with history of previous stroke and hyperlipidemia returning for a yearly visit.   Last September he had lower extremity arterial Dopplers that showed no evidence of significant obstruction. Just 2 weeks later, while his warfarin was interrupted for hematuria he developed ischemic left leg due to embolism and underwent surgical embolectomy by Dr. early. He has recovered well from this. He does not have claudication. I don't think he's had any new problems with hematuria or urinary retention since then.  He saw Dr. [name] In February and his aneurysm was unchanged. He recommended follow-up in 2 years.  He denies exertional dyspnea or angina, symptoms at rest, lower extremity edema, palpitations or syncope.  Last echo was performed in September 2017 show mild-moderate aortic stenosis, normal left ventricular ejection fraction 60-65 %. He has never required cardiac catheterization. He had a mildly abnormal nuclear stress test in 2003 suggestive of lateral wall ischemia, but I don't think this was ever associated with symptoms.  Past Medical History:  Diagnosis Date  . AAA (abdominal aortic aneurysm) ([place])   . Aortic stenosis    a. mild-mod by echo 06/2016.  . Ascending aortic aneurysm ([place])   . Atrial fibrillation ([place])   . CKD (chronic kidney disease), stage III ([facility])    baseline Cr 1.4-1.5 in 2016-2017 by PCP)  . CVA (cerebral infarction)   . Depression   . DVT (deep venous thrombosis) ([place]) 2006  . Dyslipidemia   . Hematuria   . HOH (hard of hearing)   .  Hypercholesterolemia   . OSA (obstructive sleep apnea)    intol to CPAP  . Permanent atrial fibrillation ([place])   . Prostate enlargement   . Pulmonary nodules     Past Surgical History:  Procedure Laterality Date  . EMBOLECTOMY Left 07/02/2016   Procedure: EMBOLECTOMY LEFT FEMORAL ARTERY;  Surgeon: [name], MD;  Location: [place];  Service: Vascular;  Laterality: Left;  . NM MYOCAR PERF WALL MOTION  08/24/2002   markedly positive,subtle lateral ischemia  . PROSTATE SURGERY    . US ECHOCARDIOGRAPHY  01/06/2012   mod. AOV ca+,mild to mod. AS,mod mostly posterior MAC-mild MR    Current Medications: Outpatient Medications Prior to Visit  Medication Sig Dispense Refill  . finasteride (PROSCAR) 5 MG tablet Take 5 mg by mouth daily.    Marland Kitchen warfarin (COUMADIN) 6 MG tablet Take 6 mg by mouth daily.    Marland Kitchen escitalopram (LEXAPRO) 10 MG tablet Take 10 mg by mouth daily.    . metoprolol succinate (TOPROL-XL) 25 MG 24 hr tablet Take 1 tablet (25 mg total) by mouth daily. (Patient not taking: Reported on 07/21/2016) 30 tablet 0   No facility-administered medications prior to visit.      Allergies:   Flomax [tamsulosin hcl]   Social History   Social History  . Marital status: Married    Spouse name: N/A  . Number of children: 2  . Years of education: N/A   Social History Main Topics  . Smoking status:  Former Smoker    Packs/day: 1.00    Years: 10.00    Types: Cigarettes    Quit date: 10/05/1952  . Smokeless tobacco: Former Systems developer    Quit date: 10/05/1976  . Alcohol use 0.0 oz/week  . Drug use: No  . Sexual activity: No   Other Topics Concern  . None   Social History Narrative  . None     Family History:  The patient's family history includes Diabetes in his father; Heart failure in his mother; Stroke in his father.   ROS:   Please see the history of present illness.    ROS All other systems reviewed and are negative.   PHYSICAL EXAM:   VS:  BP 110/80   Pulse 93   Ht _0   (1.854 m)   Wt 219 lb (99.3 kg)   BMI 28.89 kg/m     General: Alert, oriented x3, no distress, Very hard of hearing Head: no evidence of trauma, PERRL, EOMI, no exophtalmos or lid lag, no myxedema, no xanthelasma; normal ears, nose and oropharynx Neck: normal jugular venous pulsations and no hepatojugular reflux; brisk carotid pulses without delay and no carotid bruits Chest: clear to auscultation, no signs of consolidation by percussion or palpation, normal fremitus, symmetrical and full respiratory excursions Cardiovascular: normal position and quality of the apical impulse,irregular; early peaking 2/6 aortic ejection murmur, barely audible decrescendo diastolic aortic murmur in the left lower sternal border, no rubs or gallops Abdomen: no tenderness or distention, no masses by palpation, no abnormal pulsatility or arterial bruits, normal bowel sounds, no hepatosplenomegaly Extremities: no clubbing, cyanosis or edema; 2+ radial, ulnar and brachial pulses bilaterally; 2+ right femoral, posterior tibial and dorsalis pedis pulses; 2+ left femoral, posterior tibial and dorsalis pedis pulses; no subclavian or femoral bruits Neurological: grossly nonfocal Psych: Normal mood and affect   Wt Readings from Last 3 Encounters:  07/12/17 219 lb (99.3 kg)  11/25/16 220 lb (99.8 kg)  10/27/16 219 lb (99.3 kg)      Studies/Labs Reviewed:   EKG:  EKG is ordered today.  The ekg ordered today demonstrates Atrial fibrillation with controlled ventricular response, mild nonspecific IVCD with QRS 104 ms, QTC 472 ms otherwise normal  Lipid Panel  No results found for: CHOL, TRIG, HDL, CHOLHDL, VLDL, LDLCALC, LDLDIRECT BMET    Component Value Date/Time   NA 139 07/10/2016   K 4.5 07/10/2016   CL 107 07/05/2016 0252   CO2 25 07/05/2016 0252   GLUCOSE 113 (H) 07/05/2016 0252   BUN 20 07/10/2016   CREATININE 1.5 (A) 07/10/2016   CREATININE 1.47 (H) 07/05/2016 0252   CALCIUM 8.6 (L) 07/05/2016 0252    GFRNONAA 42 (L) 07/05/2016 0252   GFRAA 48 (L) 07/05/2016 0252     ASSESSMENT:    1. Thoracic ascending aortic aneurysm ([place])   2. Mild aortic stenosis   3. Permanent atrial fibrillation ([place])   4. PAD (peripheral artery disease) ([place])   5. Long term current use of anticoagulant   6. Pure hypercholesterolemia      PLAN:  In order of problems listed above:  1. Asc Ao Aneurysm: stable at 4.5 cm in diameter, followed by Dr. Gilford Raid. Follow-up interval for CT increased to 2 years 2. AS: Mild-moderate by recent echo, not yet hemodynamically significant 3. PAD: He has atherosclerosis, but no significant blockage by Doppler last September; a couple of weeks after that Doppler was performed he had arterial embolism to the left leg, during the  period when warfarin was interrupted for hematuria.. He has recovered well following the embolectomy. 4. AFib: Rate controlled without medications. CHADSVasc at least 4 (age 57, CVA 2). Continue anticoagulation 5. Warfarin: Prothrombin time monitored by Dr. Doyle Askew, on warfarin. Hematuria has not recently been a problem 6. HLP: no longer on lipid-lowering therapy, don't have his most recent lipid profile since atorvastatin 10 mg was stopped. LDL was 83 before interrupting.   Medication Adjustments/Labs and Tests Ordered: Current medicines are reviewed at length with the patient today.  Concerns regarding medicines are outlined above.  Medication changes, Labs and Tests ordered today are listed in the Patient Instructions below. Patient Instructions  Dr [name] recommends that you schedule a follow-up appointment in 12 months. You will receive a reminder letter in the mail two months in advance. If you don't receive a letter, please call our office to schedule the follow-up appointment.  If you need a refill on your cardiac medications before your next appointment, please call your pharmacy.    Signed, [name], MD  07/12/2017 10:00 AM      [facility] [address], [place], [place]  [address] Phone: [phone]; Fax: [phone]

## 2017-07-12 NOTE — Patient Instructions (Signed)
Dr Croitoru recommends that you schedule a follow-up appointment in 12 months. You will receive a reminder letter in the mail two months in advance. If you don't receive a letter, please call our office to schedule the follow-up appointment.  If you need a refill on your cardiac medications before your next appointment, please call your pharmacy. 

## 2017-07-15 DIAGNOSIS — Z136 Encounter for screening for cardiovascular disorders: Secondary | ICD-10-CM | POA: Diagnosis not present

## 2017-07-15 DIAGNOSIS — Z7901 Long term (current) use of anticoagulants: Secondary | ICD-10-CM | POA: Diagnosis not present

## 2017-07-15 DIAGNOSIS — I4891 Unspecified atrial fibrillation: Secondary | ICD-10-CM | POA: Diagnosis not present

## 2017-07-15 DIAGNOSIS — Z131 Encounter for screening for diabetes mellitus: Secondary | ICD-10-CM | POA: Diagnosis not present

## 2017-07-15 DIAGNOSIS — I700 Atherosclerosis of aorta: Secondary | ICD-10-CM | POA: Diagnosis not present

## 2017-08-12 DIAGNOSIS — Z7901 Long term (current) use of anticoagulants: Secondary | ICD-10-CM | POA: Diagnosis not present

## 2017-08-12 DIAGNOSIS — I4891 Unspecified atrial fibrillation: Secondary | ICD-10-CM | POA: Diagnosis not present

## 2017-09-10 DIAGNOSIS — Z7901 Long term (current) use of anticoagulants: Secondary | ICD-10-CM | POA: Diagnosis not present

## 2017-10-08 DIAGNOSIS — I481 Persistent atrial fibrillation: Secondary | ICD-10-CM | POA: Diagnosis not present

## 2017-10-08 DIAGNOSIS — Z7901 Long term (current) use of anticoagulants: Secondary | ICD-10-CM | POA: Diagnosis not present

## 2017-11-08 DIAGNOSIS — Z7901 Long term (current) use of anticoagulants: Secondary | ICD-10-CM | POA: Diagnosis not present

## 2017-11-08 DIAGNOSIS — I4891 Unspecified atrial fibrillation: Secondary | ICD-10-CM | POA: Diagnosis not present

## 2017-11-22 DIAGNOSIS — Z7901 Long term (current) use of anticoagulants: Secondary | ICD-10-CM | POA: Diagnosis not present

## 2017-11-22 DIAGNOSIS — I4891 Unspecified atrial fibrillation: Secondary | ICD-10-CM | POA: Diagnosis not present

## 2017-12-20 DIAGNOSIS — Z7901 Long term (current) use of anticoagulants: Secondary | ICD-10-CM | POA: Diagnosis not present

## 2018-01-10 DIAGNOSIS — Z7901 Long term (current) use of anticoagulants: Secondary | ICD-10-CM | POA: Diagnosis not present

## 2018-01-10 DIAGNOSIS — F322 Major depressive disorder, single episode, severe without psychotic features: Secondary | ICD-10-CM | POA: Diagnosis not present

## 2018-01-17 DIAGNOSIS — Z7901 Long term (current) use of anticoagulants: Secondary | ICD-10-CM | POA: Diagnosis not present

## 2018-02-14 DIAGNOSIS — Z7901 Long term (current) use of anticoagulants: Secondary | ICD-10-CM | POA: Diagnosis not present

## 2018-02-14 DIAGNOSIS — I481 Persistent atrial fibrillation: Secondary | ICD-10-CM | POA: Diagnosis not present

## 2018-02-21 DIAGNOSIS — Z7901 Long term (current) use of anticoagulants: Secondary | ICD-10-CM | POA: Diagnosis not present

## 2018-03-07 DIAGNOSIS — Z7901 Long term (current) use of anticoagulants: Secondary | ICD-10-CM | POA: Diagnosis not present

## 2018-04-04 DIAGNOSIS — I481 Persistent atrial fibrillation: Secondary | ICD-10-CM | POA: Diagnosis not present

## 2018-04-04 DIAGNOSIS — Z7901 Long term (current) use of anticoagulants: Secondary | ICD-10-CM | POA: Diagnosis not present

## 2018-05-02 DIAGNOSIS — Z7901 Long term (current) use of anticoagulants: Secondary | ICD-10-CM | POA: Diagnosis not present

## 2018-05-02 DIAGNOSIS — I4891 Unspecified atrial fibrillation: Secondary | ICD-10-CM | POA: Diagnosis not present

## 2018-05-06 DIAGNOSIS — R310 Gross hematuria: Secondary | ICD-10-CM | POA: Diagnosis not present

## 2018-05-18 DIAGNOSIS — L84 Corns and callosities: Secondary | ICD-10-CM | POA: Diagnosis not present

## 2018-05-26 DIAGNOSIS — Z7901 Long term (current) use of anticoagulants: Secondary | ICD-10-CM | POA: Diagnosis not present

## 2018-05-26 DIAGNOSIS — I481 Persistent atrial fibrillation: Secondary | ICD-10-CM | POA: Diagnosis not present

## 2018-05-30 DIAGNOSIS — Z961 Presence of intraocular lens: Secondary | ICD-10-CM | POA: Diagnosis not present

## 2018-05-30 DIAGNOSIS — H524 Presbyopia: Secondary | ICD-10-CM | POA: Diagnosis not present

## 2018-05-30 DIAGNOSIS — H31001 Unspecified chorioretinal scars, right eye: Secondary | ICD-10-CM | POA: Diagnosis not present

## 2018-05-30 DIAGNOSIS — H43813 Vitreous degeneration, bilateral: Secondary | ICD-10-CM | POA: Diagnosis not present

## 2018-06-23 DIAGNOSIS — Z7901 Long term (current) use of anticoagulants: Secondary | ICD-10-CM | POA: Diagnosis not present

## 2018-06-23 DIAGNOSIS — I4891 Unspecified atrial fibrillation: Secondary | ICD-10-CM | POA: Diagnosis not present

## 2018-07-11 DIAGNOSIS — Z23 Encounter for immunization: Secondary | ICD-10-CM | POA: Diagnosis not present

## 2018-07-11 DIAGNOSIS — Z0000 Encounter for general adult medical examination without abnormal findings: Secondary | ICD-10-CM | POA: Diagnosis not present

## 2018-07-11 DIAGNOSIS — Z7901 Long term (current) use of anticoagulants: Secondary | ICD-10-CM | POA: Diagnosis not present

## 2018-07-11 DIAGNOSIS — N400 Enlarged prostate without lower urinary tract symptoms: Secondary | ICD-10-CM | POA: Diagnosis not present

## 2018-07-11 DIAGNOSIS — F322 Major depressive disorder, single episode, severe without psychotic features: Secondary | ICD-10-CM | POA: Diagnosis not present

## 2018-07-11 DIAGNOSIS — I6930 Unspecified sequelae of cerebral infarction: Secondary | ICD-10-CM | POA: Diagnosis not present

## 2018-07-11 DIAGNOSIS — I4891 Unspecified atrial fibrillation: Secondary | ICD-10-CM | POA: Diagnosis not present

## 2018-07-11 DIAGNOSIS — I712 Thoracic aortic aneurysm, without rupture: Secondary | ICD-10-CM | POA: Diagnosis not present

## 2018-07-11 DIAGNOSIS — I700 Atherosclerosis of aorta: Secondary | ICD-10-CM | POA: Diagnosis not present

## 2018-07-11 DIAGNOSIS — Z131 Encounter for screening for diabetes mellitus: Secondary | ICD-10-CM | POA: Diagnosis not present

## 2018-07-11 DIAGNOSIS — E7800 Pure hypercholesterolemia, unspecified: Secondary | ICD-10-CM | POA: Diagnosis not present

## 2018-07-19 ENCOUNTER — Ambulatory Visit (INDEPENDENT_AMBULATORY_CARE_PROVIDER_SITE_OTHER): Payer: Medicare Other | Admitting: Cardiovascular Disease

## 2018-07-19 ENCOUNTER — Encounter: Payer: Self-pay | Admitting: Cardiovascular Disease

## 2018-07-19 VITALS — BP 130/74 | HR 82 | Ht 73.0 in | Wt 218.2 lb

## 2018-07-19 DIAGNOSIS — I350 Nonrheumatic aortic (valve) stenosis: Secondary | ICD-10-CM | POA: Diagnosis not present

## 2018-07-19 DIAGNOSIS — I712 Thoracic aortic aneurysm, without rupture: Secondary | ICD-10-CM

## 2018-07-19 DIAGNOSIS — I739 Peripheral vascular disease, unspecified: Secondary | ICD-10-CM | POA: Diagnosis not present

## 2018-07-19 DIAGNOSIS — Z7901 Long term (current) use of anticoagulants: Secondary | ICD-10-CM | POA: Diagnosis not present

## 2018-07-19 DIAGNOSIS — E7800 Pure hypercholesterolemia, unspecified: Secondary | ICD-10-CM

## 2018-07-19 DIAGNOSIS — I4821 Permanent atrial fibrillation: Secondary | ICD-10-CM

## 2018-07-19 DIAGNOSIS — I7121 Aneurysm of the ascending aorta, without rupture: Secondary | ICD-10-CM

## 2018-07-19 NOTE — Patient Instructions (Signed)
Medication Instructions:  Dr [name] recommends that you continue on your current medications as directed. Please refer to the Current Medication list given to you today.  If you need a refill on your cardiac medications before your next appointment, please call your pharmacy.   Lab work: NONE ORDERED  If you have labs (blood work) drawn today and your tests are completely normal, you will receive your results only by: Marland Kitchen MyChart Message (if you have MyChart) OR . A paper copy in the mail If you have any lab test that is abnormal or we need to change your treatment, we will call you to review the results.  Testing/Procedures: Echocardiogram in 12 months - Your physician has requested that you have an echocardiogram. Echocardiography is a painless test that uses sound waves to create images of your heart. It provides your doctor with information about the size and shape of your heart and how well your heart's chambers and valves are working. This procedure takes approximately one hour. There are no restrictions for this procedure.  >> This has been ordered to be completed at our [facility] location [place], [place] [address] [phone]   Follow-Up: At [facility], you and your health needs are our priority.  As part of our continuing mission to provide you with exceptional heart care, we have created designated Provider Care Teams.  These Care Teams include your primary Cardiologist (physician) and Advanced Practice Providers (APPs -  Physician Assistants and Nurse Practitioners) who all work together to provide you with the care you need, when you need it. You will need a follow up appointment in 12 months.  Please call our office 2 months in advance to schedule this appointment.  You may see [name], MD or one of the following Advanced Practice Providers on your designated Care Team: [place], [place] . Fabian Sharp, PA-C

## 2018-07-19 NOTE — Progress Notes (Signed)
Cardiology Office Note    Date:  07/23/2018   ID:  Cesar [name], DOB Apr 02, 1929, MRN [id number]  PCP:  Cesar Melter, MD  Cardiologist:   Cesar Klein, MD   Chief Complaint  Patient presents with  . Atrial Fibrillation    History of Present Illness:  Cesar [name] is a 84 y.o. male with a moderate aneurysm of the ascending aorta (stable at 4.5 cm in size on serial CT), mild aortic stenosis, permanent atrial fibrillation with history of previous stroke and systemic embolism and hyperlipidemia returning for a yearly visit.   He is generally [name] well and has no complaints.  He is quite sedentary though.  The patient specifically denies any chest pain at rest exertion, dyspnea at rest or with exertion, orthopnea, paroxysmal nocturnal dyspnea, syncope, palpitations, focal neurological deficits, intermittent claudication, lower extremity edema, unexplained weight gain, cough, hemoptysis or wheezing.  He developed left iliac artery embolism when warfarin was interrupted for hematuria about 2 years ago, but has not had problems since.  He has not have problems with intermittent claudication, hematuria or urinary retention symptoms.  He does have reduced arterial flow to the left lower extremity with an ABI of 0.82.  He is very hard of hearing.  His last visit with Dr. Cyndia [name] and last CT of the chest occurred in 2018, scheduled for follow-up next February.  His murmur today sounds louder.  Last echo was performed in September 2017 show mild-moderate aortic stenosis, normal left ventricular ejection fraction 60-65 %. He has never required cardiac catheterization. He had a mildly abnormal nuclear stress test in 2003 suggestive of lateral wall ischemia, but I don't think this was ever associated with symptoms.  Past Medical History:  Diagnosis Date  . AAA (abdominal aortic aneurysm) ([place])   . Aortic stenosis    a. mild-mod by echo 06/2016.  . Ascending aortic aneurysm ([place])   .  Atrial fibrillation ([place])   . CKD (chronic kidney disease), stage III (HCC)    baseline Cr 1.4-1.5 in 2016-2017 by PCP)  . CVA (cerebral infarction)   . Depression   . DVT (deep venous thrombosis) ([place]) 2006  . Dyslipidemia   . Hematuria   . HOH (hard of hearing)   . Hypercholesterolemia   . OSA (obstructive sleep apnea)    intol to CPAP  . Permanent atrial fibrillation   . Prostate enlargement   . Pulmonary nodules     Past Surgical History:  Procedure Laterality Date  . EMBOLECTOMY Left 07/02/2016   Procedure: EMBOLECTOMY LEFT FEMORAL ARTERY;  Surgeon: Cesar Posner, MD;  Location: [facility];  Service: Vascular;  Laterality: Left;  . NM MYOCAR PERF WALL MOTION  08/24/2002   markedly positive,subtle lateral ischemia  . PROSTATE SURGERY    . US ECHOCARDIOGRAPHY  01/06/2012   mod. AOV ca+,mild to mod. AS,mod mostly posterior MAC-mild MR    Current Medications: Outpatient Medications Prior to Visit  Medication Sig Dispense Refill  . atorvastatin (LIPITOR) 40 MG tablet Take 1 tablet by mouth daily.  2  . finasteride (PROSCAR) 5 MG tablet Take 5 mg by mouth daily.    Marland Kitchen warfarin (COUMADIN) 7.5 MG tablet Take 7.5 mg by mouth daily.     . citalopram (CELEXA) 10 MG tablet Take 10 mg by mouth daily.     No facility-administered medications prior to visit.      Allergies:   Flomax [tamsulosin hcl]   Social History   Socioeconomic History  .  Marital status: Married    Spouse name: Not on file  . Number of children: 2  . Years of education: Not on file  . Highest education level: Not on file  Occupational History  . Not on file  Social Needs  . Financial resource strain: Not on file  . Food insecurity:    Worry: Not on file    Inability: Not on file  . Transportation needs:    Medical: Not on file    Non-medical: Not on file  Tobacco Use  . Smoking status: Former Smoker    Packs/day: 1.00    Years: 10.00    Pack years: 10.00    Types: Cigarettes    Last attempt to quit:  10/05/1952    Years since quitting: 65.8  . Smokeless tobacco: Former Systems developer    Quit date: 10/05/1976  Substance and Sexual Activity  . Alcohol use: Yes    Alcohol/week: 0.0 standard drinks  . Drug use: No  . Sexual activity: Never  Lifestyle  . Physical activity:    Days per week: Not on file    Minutes per session: Not on file  . Stress: Not on file  Relationships  . Social connections:    Talks on phone: Not on file    Gets together: Not on file    Attends religious service: Not on file    Active member of club or organization: Not on file    Attends meetings of clubs or organizations: Not on file    Relationship status: Not on file  Other Topics Concern  . Not on file  Social History Narrative  . Not on file     Family History:  The patient's family history includes Diabetes in his father; Heart failure in his mother; Stroke in his father.   ROS:   Please see the history of present illness.    ROS all other systems are reviewed and are negative  PHYSICAL EXAM:   VS:  BP 130/74   Pulse 82   Ht _0  (1.854 m)   Wt 218 lb 3.2 oz (99 kg)   BMI 28.79 kg/m      General: Alert, oriented x3, no distress, very hard of hearing Head: no evidence of trauma, PERRL, EOMI, no exophtalmos or lid lag, no myxedema, no xanthelasma; normal ears, nose and oropharynx Neck: normal jugular venous pulsations and no hepatojugular reflux; brisk carotid pulses without delay and no carotid bruits Chest: clear to auscultation, no signs of consolidation by percussion or palpation, normal fremitus, symmetrical and full respiratory excursions Cardiovascular: normal position and quality of the apical impulse, regular rhythm, normal first and second heart sounds, 3/6 early to mid peaking systolic ejection in the aortic focus, no diastolic murmurs, rubs or gallops Abdomen: no tenderness or distention, no masses by palpation, no abnormal pulsatility or arterial bruits, normal bowel sounds, no  hepatosplenomegaly Extremities: no clubbing, cyanosis or edema; 2+ radial, ulnar and brachial pulses bilaterally; 2+ right femoral, posterior tibial and dorsalis pedis pulses; 2+ left femoral, diminished posterior tibial and dorsalis pedis pulses; no subclavian or femoral bruits Neurological: grossly nonfocal Psych: Normal mood and affect   Wt Readings from Last 3 Encounters:  07/19/18 218 lb 3.2 oz (99 kg)  07/12/17 219 lb (99.3 kg)  11/25/16 220 lb (99.8 kg)      Studies/Labs Reviewed:   EKG:  EKG is ordered today.  It shows atrial fibrillation with a single PVC, incomplete left bundle branch block (QRS 110  ms), QTC 497 (suspect overestimated by ECG algorithm)  Lipid Panel  No results found for: CHOL, TRIG, HDL, CHOLHDL, VLDL, LDLCALC, LDLDIRECT BMET    Component Value Date/Time   NA 139 07/10/2016   K 4.5 07/10/2016   CL 107 07/05/2016 0252   CO2 25 07/05/2016 0252   GLUCOSE 113 (H) 07/05/2016 0252   BUN 20 07/10/2016   CREATININE 1.5 (A) 07/10/2016   CREATININE 1.47 (H) 07/05/2016 0252   CALCIUM 8.6 (L) 07/05/2016 0252   GFRNONAA 42 (L) 07/05/2016 0252   GFRAA 48 (L) 07/05/2016 0252     ASSESSMENT:    1. Permanent atrial fibrillation ([place])   2. Ascending aortic aneurysm ([place])   3. Aortic valve stenosis, nonrheumatic   4. PAD (peripheral artery disease) ([place])   5. Long term (current) use of anticoagulants   6. Hypercholesterolemia      PLAN:  In order of problems listed above:  1. AFib: Rate is controlled without medications suggesting he has some degree of AV node disease. CHADSVasc at least 5 (age 49, CVA 2, PAD). Continue anticoagulation.  Note that he developed embolism when warfarin was instructed for hematuria.  Anticoagulation should only be interrupted when critically necessary and for the shortest possible amount of time, bridging with enoxaparin if appropriate. 2. Asc Ao Aneurysm: stable at 4.5 cm in diameter, followed by Dr. Gilford Raid.  With plan for  follow-up in February 2020. 3. AS: Louder murmur with delayed peaking, recheck echocardiogram.  He is quite sedentary and may not develop symptoms until he has critical aortic stenosis. 4. PAD: No intermittent claudication, but again quite sedentary.  ABI 0.8 on the left, diminished following his episodes of iliac artery embolism. 5. Warfarin: Prothrombin time monitored by Dr. Doyle Askew. 6. HLP: Back on lipid-lowering therapy.   Medication Adjustments/Labs and Tests Ordered: Current medicines are reviewed at length with the patient today.  Concerns regarding medicines are outlined above.  Medication changes, Labs and Tests ordered today are listed in the Patient Instructions below. Patient Instructions  Medication Instructions:  Dr [name] recommends that you continue on your current medications as directed. Please refer to the Current Medication list given to you today.  If you need a refill on your cardiac medications before your next appointment, please call your pharmacy.   Lab work: NONE ORDERED  If you have labs (blood work) drawn today and your tests are completely normal, you will receive your results only by: Marland Kitchen MyChart Message (if you have MyChart) OR . A paper copy in the mail If you have any lab test that is abnormal or we need to change your treatment, we will call you to review the results.  Testing/Procedures: Echocardiogram in 12 months - Your physician has requested that you have an echocardiogram. Echocardiography is a painless test that uses sound waves to create images of your heart. It provides your doctor with information about the size and shape of your heart and how well your heart's chambers and valves are working. This procedure takes approximately one hour. There are no restrictions for this procedure.  >> This has been ordered to be completed at our [facility] location [place], [place] [address] [phone]   Follow-Up: At [facility],  you and your health needs are our priority.  As part of our continuing mission to provide you with exceptional heart care, we have created designated Provider Care Teams.  These Care Teams include your primary Cardiologist (physician) and Advanced Practice  Providers (APPs -  Physician Assistants and Nurse Practitioners) who all work together to provide you with the care you need, when you need it. You will need a follow up appointment in 12 months.  Please call our office 2 months in advance to schedule this appointment.  You may see Cesar Klein, MD or one of the following Advanced Practice Providers on your designated Care Team: [place], [place] . Fabian Sharp, PA-C    Signed, Cesar Klein, MD  07/23/2018 11:25 AM    [facility] [address], [place], [place]  [address] Phone: [phone]; Fax: [phone]

## 2018-07-21 DIAGNOSIS — I4891 Unspecified atrial fibrillation: Secondary | ICD-10-CM | POA: Diagnosis not present

## 2018-07-21 DIAGNOSIS — Z7901 Long term (current) use of anticoagulants: Secondary | ICD-10-CM | POA: Diagnosis not present

## 2018-07-23 ENCOUNTER — Encounter: Payer: Self-pay | Admitting: Cardiovascular Disease

## 2018-07-23 DIAGNOSIS — I739 Peripheral vascular disease, unspecified: Secondary | ICD-10-CM | POA: Insufficient documentation

## 2018-07-28 ENCOUNTER — Ambulatory Visit: Payer: Medicare Other | Admitting: Cardiovascular Disease

## 2018-08-18 DIAGNOSIS — Z7901 Long term (current) use of anticoagulants: Secondary | ICD-10-CM | POA: Diagnosis not present

## 2018-08-18 DIAGNOSIS — I4891 Unspecified atrial fibrillation: Secondary | ICD-10-CM | POA: Diagnosis not present

## 2018-08-31 DIAGNOSIS — Z7901 Long term (current) use of anticoagulants: Secondary | ICD-10-CM | POA: Diagnosis not present

## 2018-08-31 DIAGNOSIS — I4891 Unspecified atrial fibrillation: Secondary | ICD-10-CM | POA: Diagnosis not present

## 2018-09-12 DIAGNOSIS — Z79899 Other long term (current) drug therapy: Secondary | ICD-10-CM | POA: Diagnosis not present

## 2018-09-12 DIAGNOSIS — E7800 Pure hypercholesterolemia, unspecified: Secondary | ICD-10-CM | POA: Diagnosis not present

## 2018-09-27 DIAGNOSIS — I4819 Other persistent atrial fibrillation: Secondary | ICD-10-CM | POA: Diagnosis not present

## 2018-09-27 DIAGNOSIS — Z7901 Long term (current) use of anticoagulants: Secondary | ICD-10-CM | POA: Diagnosis not present

## 2018-10-13 ENCOUNTER — Other Ambulatory Visit: Payer: Self-pay | Admitting: Surgery

## 2018-10-13 DIAGNOSIS — I712 Thoracic aortic aneurysm, without rupture, unspecified: Secondary | ICD-10-CM

## 2018-10-13 NOTE — Progress Notes (Unsigned)
c 

## 2018-10-28 DIAGNOSIS — Z7901 Long term (current) use of anticoagulants: Secondary | ICD-10-CM | POA: Diagnosis not present

## 2018-10-28 DIAGNOSIS — I4891 Unspecified atrial fibrillation: Secondary | ICD-10-CM | POA: Diagnosis not present

## 2018-11-23 ENCOUNTER — Ambulatory Visit (INDEPENDENT_AMBULATORY_CARE_PROVIDER_SITE_OTHER): Payer: Medicare Other | Admitting: Surgery

## 2018-11-23 ENCOUNTER — Ambulatory Visit
Admission: RE | Admit: 2018-11-23 | Discharge: 2018-11-23 | Disposition: A | Discharge status: Home / Self Care | Payer: Medicare Other | Source: Ambulatory Visit | Attending: Surgery | Admitting: Surgery

## 2018-11-23 ENCOUNTER — Encounter: Payer: Self-pay | Admitting: Surgery

## 2018-11-23 VITALS — BP 130/78 | HR 70 | Resp 20 | Ht 73.0 in | Wt 215.0 lb

## 2018-11-23 DIAGNOSIS — I712 Thoracic aortic aneurysm, without rupture, unspecified: Secondary | ICD-10-CM

## 2018-11-23 NOTE — Progress Notes (Signed)
HPI:  Patient returns for follow-up of a 4.5 cm fusiform ascending aortic aneurysm which has been stable since 2016 when it was first identified.  He is here with his wife and daughter today.  He is very hard of hearing and wears hearing aids.  His family said that he does not say much at home and does not do very much except stay around the house and watch TV.  Current Outpatient Medications  Medication Sig Dispense Refill  . atorvastatin (LIPITOR) 40 MG tablet Take 1 tablet by mouth daily.  2  . finasteride (PROSCAR) 5 MG tablet Take 5 mg by mouth daily.    Marland Kitchen warfarin (COUMADIN) 7.5 MG tablet Take 7.5 mg by mouth daily.      No current facility-administered medications for this visit.      Physical Exam: BP 130/78   Pulse 70   Resp 20   Ht 6\' 1"  (1.854 m)   Wt 215 lb (97.5 kg)   SpO2 96% Comment: RA  BMI 28.37 kg/m  He looks well. He has a flat affect and does not say anything unless spoken to.  Then he will give short one-word answers. Cardiac exam shows a regular rate and rhythm with a 2/6 systolic murmur along the right sternal border. Lungs are clear.   Diagnostic Tests:  CLINICAL DATA:  Follow-up TAA, lung nodules  EXAM: CT CHEST WITHOUT CONTRAST  TECHNIQUE: Multidetector CT imaging of the chest was performed following the standard protocol without IV contrast.  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 Extensive 3 vessel coronary artery calcifications. Enlargement of the left atrium. No pericardial effusion.  Mediastinum/Nodes: No enlarged mediastinal, hilar, or axillary lymph nodes. Thyroid gland, trachea, and esophagus demonstrate no significant findings.  Lungs/Pleura: Multiple stable small pulmonary nodules. No pleural effusion or pneumothorax.  Upper Abdomen: No acute  abnormality.  Musculoskeletal: No chest wall mass or suspicious bone lesions identified.  IMPRESSION: 1. 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  2. Multiple stable small benign pulmonary nodules. No further routine CT follow-up is required these nodules.  3.  Coronary artery disease.   Electronically Signed   By: Eddie Candle M.D.   On: 11/23/2018 12:04   Impression:  He has a stable 4.5 cm fusiform ascending aortic aneurysm which is not changed dating back to 04/22/2015.  This is still well below the surgical threshold of 5.5 cm.  Given his age of 42 and his overall functional status I do not think he would be a candidate for surgery even if this did enlarge to 5.5 cm.  I reviewed the CT images with the patient and his wife and daughter.  I have answered all their questions.  I told them that I did not think we needed to do follow-up CT scans in his case for the reasons given above.  They understand and are in agreement.  His blood pressure is under good control.  He does have a calcified aortic valve and was noted to have mild aortic stenosis by echocardiogram in September 2017.  He is scheduled for a repeat echocardiogram in October 2020.  He has been followed by Dr. [name].  Plan:  He will continue to follow-up with his primary physician Dr. Olen Pel and with Dr. [name] for his cardiology care.  He will have a follow-up echocardiogram to reevaluate his aortic stenosis in October 2020.  We are not planning to follow-up CT scans of the chest given his age and functional status with a stable 4.5 cm ascending aortic aneurysm.  I would be happy to see him back if the need arises.  I spent 15 minutes performing this established patient evaluation and > 50% of this time was spent face to face counseling and coordinating the care of this patient's aortic aneurysm.   Gaye Pollack, MD [facility] [phone]

## 2018-11-25 NOTE — Progress Notes (Signed)
Thanks, I fully agree. MCr

## 2018-11-28 DIAGNOSIS — Z7901 Long term (current) use of anticoagulants: Secondary | ICD-10-CM | POA: Diagnosis not present

## 2018-11-28 DIAGNOSIS — I4819 Other persistent atrial fibrillation: Secondary | ICD-10-CM | POA: Diagnosis not present

## 2018-12-28 DIAGNOSIS — I4819 Other persistent atrial fibrillation: Secondary | ICD-10-CM | POA: Diagnosis not present

## 2018-12-28 DIAGNOSIS — Z7901 Long term (current) use of anticoagulants: Secondary | ICD-10-CM | POA: Diagnosis not present

## 2018-12-30 DIAGNOSIS — I4891 Unspecified atrial fibrillation: Secondary | ICD-10-CM | POA: Diagnosis not present

## 2018-12-30 DIAGNOSIS — Z7901 Long term (current) use of anticoagulants: Secondary | ICD-10-CM | POA: Diagnosis not present

## 2019-01-05 DIAGNOSIS — Z7901 Long term (current) use of anticoagulants: Secondary | ICD-10-CM | POA: Diagnosis not present

## 2019-01-12 DIAGNOSIS — I4819 Other persistent atrial fibrillation: Secondary | ICD-10-CM | POA: Diagnosis not present

## 2019-01-12 DIAGNOSIS — Z7901 Long term (current) use of anticoagulants: Secondary | ICD-10-CM | POA: Diagnosis not present

## 2019-02-09 DIAGNOSIS — I4891 Unspecified atrial fibrillation: Secondary | ICD-10-CM | POA: Diagnosis not present

## 2019-02-09 DIAGNOSIS — Z7901 Long term (current) use of anticoagulants: Secondary | ICD-10-CM | POA: Diagnosis not present

## 2019-03-09 DIAGNOSIS — I4891 Unspecified atrial fibrillation: Secondary | ICD-10-CM | POA: Diagnosis not present

## 2019-03-09 DIAGNOSIS — Z7901 Long term (current) use of anticoagulants: Secondary | ICD-10-CM | POA: Diagnosis not present

## 2019-03-15 ENCOUNTER — Encounter (HOSPITAL_COMMUNITY): Payer: Self-pay | Admitting: Emergency Medicine

## 2019-03-15 ENCOUNTER — Other Ambulatory Visit: Payer: Self-pay

## 2019-03-15 ENCOUNTER — Emergency Department (HOSPITAL_COMMUNITY): Payer: Medicare Other

## 2019-03-15 ENCOUNTER — Observation Stay (HOSPITAL_COMMUNITY): Payer: Medicare Other

## 2019-03-15 ENCOUNTER — Observation Stay (HOSPITAL_COMMUNITY)
Admission: EM | Admit: 2019-03-15 | Discharge: 2019-03-16 | Disposition: A | Discharge status: Home / Self Care | Payer: Medicare Other | Attending: Internal Medicine | Admitting: Internal Medicine

## 2019-03-15 DIAGNOSIS — I4821 Permanent atrial fibrillation: Secondary | ICD-10-CM | POA: Diagnosis present

## 2019-03-15 DIAGNOSIS — I712 Thoracic aortic aneurysm, without rupture: Secondary | ICD-10-CM | POA: Diagnosis present

## 2019-03-15 DIAGNOSIS — M6281 Muscle weakness (generalized): Secondary | ICD-10-CM | POA: Insufficient documentation

## 2019-03-15 DIAGNOSIS — I7121 Aneurysm of the ascending aorta, without rupture: Secondary | ICD-10-CM | POA: Diagnosis present

## 2019-03-15 DIAGNOSIS — I639 Cerebral infarction, unspecified: Principal | ICD-10-CM | POA: Diagnosis present

## 2019-03-15 DIAGNOSIS — Z7901 Long term (current) use of anticoagulants: Secondary | ICD-10-CM | POA: Diagnosis present

## 2019-03-15 DIAGNOSIS — I10 Essential (primary) hypertension: Secondary | ICD-10-CM | POA: Diagnosis not present

## 2019-03-15 DIAGNOSIS — Z87891 Personal history of nicotine dependence: Secondary | ICD-10-CM | POA: Diagnosis not present

## 2019-03-15 DIAGNOSIS — R471 Dysarthria and anarthria: Secondary | ICD-10-CM | POA: Diagnosis not present

## 2019-03-15 DIAGNOSIS — Z79899 Other long term (current) drug therapy: Secondary | ICD-10-CM | POA: Insufficient documentation

## 2019-03-15 DIAGNOSIS — N183 Chronic kidney disease, stage 3 unspecified: Secondary | ICD-10-CM | POA: Diagnosis present

## 2019-03-15 DIAGNOSIS — I350 Nonrheumatic aortic (valve) stenosis: Secondary | ICD-10-CM | POA: Diagnosis not present

## 2019-03-15 DIAGNOSIS — Z20828 Contact with and (suspected) exposure to other viral communicable diseases: Secondary | ICD-10-CM | POA: Diagnosis not present

## 2019-03-15 DIAGNOSIS — I722 Aneurysm of renal artery: Secondary | ICD-10-CM | POA: Diagnosis not present

## 2019-03-15 DIAGNOSIS — R29810 Facial weakness: Secondary | ICD-10-CM | POA: Diagnosis not present

## 2019-03-15 DIAGNOSIS — R000 Tachycardia, unspecified: Secondary | ICD-10-CM | POA: Diagnosis not present

## 2019-03-15 DIAGNOSIS — Z8673 Personal history of transient ischemic attack (TIA), and cerebral infarction without residual deficits: Secondary | ICD-10-CM | POA: Insufficient documentation

## 2019-03-15 HISTORY — DX: Aneurysm of the ascending aorta, without rupture: I71.21

## 2019-03-15 HISTORY — DX: Thoracic aortic aneurysm, without rupture: I71.2

## 2019-03-15 LAB — DIFFERENTIAL
Abs Immature Granulocytes: 0.01 10*3/uL (ref 0.00–0.07)
Basophils Absolute: 0.1 10*3/uL (ref 0.0–0.1)
Basophils Relative: 1 %
Eosinophils Absolute: 0.4 10*3/uL (ref 0.0–0.5)
Eosinophils Relative: 6 %
Immature Granulocytes: 0 %
Lymphocytes Relative: 23 %
Lymphs Abs: 1.4 10*3/uL (ref 0.7–4.0)
Monocytes Absolute: 0.5 10*3/uL (ref 0.1–1.0)
Monocytes Relative: 8 %
Neutro Abs: 3.7 10*3/uL (ref 1.7–7.7)
Neutrophils Relative %: 62 %

## 2019-03-15 LAB — I-STAT CHEM 8, ED
BUN: 27 mg/dL — ABNORMAL HIGH (ref 8–23)
Calcium, Ion: 1.06 mmol/L — ABNORMAL LOW (ref 1.15–1.40)
Chloride: 107 mmol/L (ref 98–111)
Creatinine, Ser: 1.7 mg/dL — ABNORMAL HIGH (ref 0.61–1.24)
Glucose, Bld: 100 mg/dL — ABNORMAL HIGH (ref 70–99)
HCT: 42 % (ref 39.0–52.0)
Hemoglobin: 14.3 g/dL (ref 13.0–17.0)
Potassium: 4.9 mmol/L (ref 3.5–5.1)
Sodium: 139 mmol/L (ref 135–145)
TCO2: 26 mmol/L (ref 22–32)

## 2019-03-15 LAB — COMPREHENSIVE METABOLIC PANEL
ALT: 15 U/L (ref 0–44)
AST: 18 U/L (ref 15–41)
Albumin: 3.4 g/dL — ABNORMAL LOW (ref 3.5–5.0)
Alkaline Phosphatase: 50 U/L (ref 38–126)
Anion gap: 8 (ref 5–15)
BUN: 24 mg/dL — ABNORMAL HIGH (ref 8–23)
CO2: 24 mmol/L (ref 22–32)
Calcium: 8.8 mg/dL — ABNORMAL LOW (ref 8.9–10.3)
Chloride: 107 mmol/L (ref 98–111)
Creatinine, Ser: 1.58 mg/dL — ABNORMAL HIGH (ref 0.61–1.24)
GFR calc Af Amer: 45 mL/min — ABNORMAL LOW (ref >60)
GFR calc non Af Amer: 39 mL/min — ABNORMAL LOW (ref >60)
Glucose, Bld: 106 mg/dL — ABNORMAL HIGH (ref 70–99)
Potassium: 4.6 mmol/L (ref 3.5–5.1)
Sodium: 139 mmol/L (ref 135–145)
Total Bilirubin: 0.8 mg/dL (ref 0.3–1.2)
Total Protein: 6.6 g/dL (ref 6.5–8.1)

## 2019-03-15 LAB — APTT: aPTT: 39 seconds — ABNORMAL HIGH (ref 24–36)

## 2019-03-15 LAB — CBC
HCT: 41.8 % (ref 39.0–52.0)
Hemoglobin: 13.5 g/dL (ref 13.0–17.0)
MCH: 31.1 pg (ref 26.0–34.0)
MCHC: 32.3 g/dL (ref 30.0–36.0)
MCV: 96.3 fL (ref 80.0–100.0)
Platelets: 155 10*3/uL (ref 150–400)
RBC: 4.34 MIL/uL (ref 4.22–5.81)
RDW: 12.7 % (ref 11.5–15.5)
WBC: 6 10*3/uL (ref 4.0–10.5)
nRBC: 0 % (ref 0.0–0.2)

## 2019-03-15 LAB — I-STAT TROPONIN, ED: Troponin i, poc: 0.02 ng/mL (ref 0.00–0.08)

## 2019-03-15 LAB — ETHANOL: Alcohol, Ethyl (B): <10 mg/dL (ref <10)

## 2019-03-15 LAB — PROTIME-INR
INR: 2.1 — ABNORMAL HIGH (ref 0.8–1.2)
Prothrombin Time: 23.3 seconds — ABNORMAL HIGH (ref 11.4–15.2)

## 2019-03-15 MED ORDER — ACETAMINOPHEN 325 MG PO TABS: 650.0000 mg | ORAL_TABLET | ORAL | Status: DC | PRN

## 2019-03-15 MED ORDER — ACETAMINOPHEN 650 MG RE SUPP: 650.0000 mg | RECTAL | Status: DC | PRN

## 2019-03-15 MED ORDER — STROKE: EARLY STAGES OF RECOVERY BOOK: Freq: Once | Status: DC

## 2019-03-15 MED ORDER — ACETAMINOPHEN 160 MG/5ML PO SOLN: 650.0000 mg | ORAL | Status: DC | PRN

## 2019-03-15 MED ORDER — ATORVASTATIN CALCIUM 40 MG PO TABS: 40.0000 mg | ORAL_TABLET | Freq: Every day | ORAL | Status: DC

## 2019-03-15 MED ORDER — WARFARIN - PHARMACIST DOSING INPATIENT: Freq: Every day | Status: DC

## 2019-03-15 NOTE — ED Notes (Signed)
Family contact [phone].

## 2019-03-15 NOTE — H&P (Signed)
History and Physical    Cesar [name] YTW:[id number] DOB: March 20, 1929 DOA: 03/15/2019  PCP: Orpah Melter, MD  Patient coming from: Home  I have personally briefly reviewed patient's old medical records in [place]  Chief Complaint: Code Stroke  HPI: Cesar [name] is a 85 y.o. male with medical history significant of A.Fib on coumadin, prior stroke without major residual deficit, prior thromboembolism to femoral artery requiring embolectomy, DVT, CKD stage 3, TAAA.  Patient presents to ED via EMS with acute onset of L sided weakness, facial droop, abnormal gait, slurred speech.  LKW 6:40 pm.   ED Course: At the time of arrival to the ED, the patient's weakness had completely resolved. He had speech that was at times difficult to understand, but not clearly due to a dysarthria. Speech was without error of grammar or syntax with naming and comprehension intact.   CT reveals no hemorrhage, does reveal an old L frontal small to medium sized infarct.  Review of Systems: As per HPI otherwise 10 point review of systems negative.   Past Medical History:  Diagnosis Date  . AAA (abdominal aortic aneurysm) ([place])    Possibly an erroneous entry: only imaging i see is 2017 and specifically shows "no aneurysmal dilation" of abdominal aorta  . Aortic stenosis    a. mild-mod by echo 06/2016.  Marland Kitchen Atrial fibrillation ([place])   . CKD (chronic kidney disease), stage III (HCC)    baseline Cr 1.4-1.5 in 2016-2017 by PCP)  . CVA (cerebral infarction)   . Depression   . DVT (deep venous thrombosis) ([place]) 2006  . Dyslipidemia   . Hematuria   . HOH (hard of hearing)   . Hypercholesterolemia   . OSA (obstructive sleep apnea)    intol to CPAP  . Permanent atrial fibrillation   . Prostate enlargement   . Pulmonary nodules   . Thoracic ascending aortic aneurysm ([place])    4.5cm in 11/2018, stable since 2016    Past Surgical History:  Procedure Laterality Date  . EMBOLECTOMY Left 07/02/2016    Procedure: EMBOLECTOMY LEFT FEMORAL ARTERY;  Surgeon: [name], MD;  Location: [place];  Service: Vascular;  Laterality: Left;  . NM MYOCAR PERF WALL MOTION  08/24/2002   markedly positive,subtle lateral ischemia  . PROSTATE SURGERY    . US ECHOCARDIOGRAPHY  01/06/2012   mod. AOV ca+,mild to mod. AS,mod mostly posterior MAC-mild MR     reports that he quit smoking about 66 years ago. His smoking use included cigarettes. He has a 10.00 pack-year smoking history. He quit smokeless tobacco use about 42 years ago. He reports current alcohol use. He reports that he does not use drugs.  Allergies  Allergen Reactions  . Flomax [Tamsulosin Hcl] Other (See Comments)    Can't pee    Family History  Problem Relation Age of Onset  . Heart failure Mother   . Diabetes Father   . Stroke Father      Prior to Admission medications   Medication Sig Start Date End Date Taking? Authorizing Provider  atorvastatin (LIPITOR) 40 MG tablet Take 1 tablet by mouth daily. 07/13/18   [provider]  finasteride (PROSCAR) 5 MG tablet Take 5 mg by mouth daily.    [provider]  warfarin (COUMADIN) 7.5 MG tablet Take 7.5 mg by mouth daily.     [provider]    Physical Exam: Vitals:   03/15/19 2000 03/15/19 2006 03/15/19 2007  Pulse: (!) 101 73  Resp: (!) 28 (!) 21   Temp:  98.3 F (36.8 C)   TempSrc:  Oral   SpO2: 99% 98%   Weight:   96.3 kg  Height:   _0  (1.854 m)    Constitutional: NAD, calm, comfortable Eyes: PERRL, lids and conjunctivae normal ENMT: Mucous membranes are moist. Posterior pharynx clear of any exudate or lesions.Normal dentition.  Neck: normal, supple, no masses, no thyromegaly Respiratory: clear to auscultation bilaterally, no wheezing, no crackles. Normal respiratory effort. No accessory muscle use.  Cardiovascular: Regular rate and rhythm, no murmurs / rubs / gallops. No extremity edema. 2+ pedal pulses. No carotid bruits.  Abdomen: no  tenderness, no masses palpated. No hepatosplenomegaly. Bowel sounds positive.  Musculoskeletal: no clubbing / cyanosis. No joint deformity upper and lower extremities. Good ROM, no contractures. Normal muscle tone.  Skin: no rashes, lesions, ulcers. No induration Neurologic: Slight slurring of speech, no focal deficits: Strength 4+/5 in all 4.  Psychiatric: Normal judgment and insight. Alert and oriented x 3. Normal mood.    Labs on Admission: I have personally reviewed following labs and imaging studies  CBC: Recent Labs  Lab 03/15/19 1940 03/15/19 2003  WBC 6.0  --   NEUTROABS 3.7  --   HGB 13.5 14.3  HCT 41.8 42.0  MCV 96.3  --   PLT 155  --    Basic Metabolic Panel: Recent Labs  Lab 03/15/19 1940 03/15/19 2003  NA 139 139  K 4.6 4.9  CL 107 107  CO2 24  --   GLUCOSE 106* 100*  BUN 24* 27*  CREATININE 1.58* 1.70*  CALCIUM 8.8*  --    GFR: Estimated Creatinine Clearance: 37.5 mL/min (A) (by C-G formula based on SCr of 1.7 mg/dL (H)). Liver Function Tests: Recent Labs  Lab 03/15/19 1940  AST 18  ALT 15  ALKPHOS 50  BILITOT 0.8  PROT 6.6  ALBUMIN 3.4*   No results for input(s): LIPASE, AMYLASE in the last 168 hours. No results for input(s): AMMONIA in the last 168 hours. Coagulation Profile: Recent Labs  Lab 03/15/19 1940  INR 2.1*   Cardiac Enzymes: No results for input(s): CKTOTAL, CKMB, CKMBINDEX, TROPONINI in the last 168 hours. BNP (last 3 results) No results for input(s): PROBNP in the last 8760 hours. HbA1C: No results for input(s): HGBA1C in the last 72 hours. CBG: No results for input(s): GLUCAP in the last 168 hours. Lipid Profile: No results for input(s): CHOL, HDL, LDLCALC, TRIG, CHOLHDL, LDLDIRECT in the last 72 hours. Thyroid Function Tests: No results for input(s): TSH, T4TOTAL, FREET4, T3FREE, THYROIDAB in the last 72 hours. Anemia Panel: No results for input(s): VITAMINB12, FOLATE, FERRITIN, TIBC, IRON, RETICCTPCT in the last 72  hours. Urine analysis:    Component Value Date/Time   COLORURINE AMBER (A) 01/01/2012 1328   APPEARANCEUR CLEAR 01/01/2012 1328   LABSPEC 1.024 01/01/2012 1328   PHURINE 5.5 01/01/2012 1328   GLUCOSEU NEGATIVE 01/01/2012 1328   HGBUR SMALL (A) 01/01/2012 1328   BILIRUBINUR SMALL (A) 01/01/2012 1328   KETONESUR NEGATIVE 01/01/2012 1328   PROTEINUR 100 (A) 01/01/2012 1328   UROBILINOGEN 2.0 (H) 01/01/2012 1328   NITRITE NEGATIVE 01/01/2012 1328   LEUKOCYTESUR NEGATIVE 01/01/2012 1328    Radiological Exams on Admission: Ct Head Code Stroke Wo Contrast  Result Date: 03/15/2019 CLINICAL DATA:  Code stroke. Left arm drift and slurred speech. Last seen well 18 40 EXAM: CT HEAD WITHOUT CONTRAST TECHNIQUE: Contiguous axial images were obtained from the base  of the skull through the vertex without intravenous contrast. COMPARISON:  01/01/2012 FINDINGS: Brain: There is no mass, hemorrhage or extra-axial collection. The size and configuration of the ventricles and extra-axial CSF spaces are normal. There is an old left frontal lobe infarct and a left caudate head old lacunar infarct. There is periventricular hypoattenuation compatible with chronic microvascular disease. Vascular: Atherosclerotic calcification of the internal carotid arteries at the skull base. No abnormal hyperdensity of the major intracranial arteries or dural venous sinuses. Skull: The visualized skull base, calvarium and extracranial soft tissues are normal. Sinuses/Orbits: No fluid levels or advanced mucosal thickening of the visualized paranasal sinuses. No mastoid or middle ear effusion. The orbits are normal. ASPECTS [facility] Stroke Program Early CT Score) - Ganglionic level infarction (caudate, lentiform nuclei, internal capsule, insula, M1-M3 cortex): 7 - Supraganglionic infarction (M4-M6 cortex): 3 Total score (0-10 with 10 being normal): 10 IMPRESSION: 1. No acute hemorrhage. 2. ASPECTS is 10. 3. Old left MCA territory infarcts.  These results were communicated to Dr. Kerney Elbe at 7:55 pm on 03/15/2019 by text page via the [facility] messaging system. Electronically Signed   By: Ulyses Jarred M.D.   On: 03/15/2019 19:56    EKG: Independently reviewed.  Assessment/Plan Principal Problem:   Acute ischemic stroke [facility]) Active Problems:   Permanent atrial fibrillation (HCC)   Mild aortic stenosis   Thoracic ascending aortic aneurysm (HCC)   CKD (chronic kidney disease), stage III ([place])    1. Acute ischemic stroke - 1. Stroke pathway 2. Neuro consulted 3. Will leave on coumadin for now 4. MRI/MRA 5. CXR 6. 2d echo 7. A1C, FLP 8. PT/OT/SLP 2. A.fib - 1. Continue coumadin 2. Tele monitor 3. Mild AS - 1. Scheduled for 2d echo in October, but getting one this admit anyhow for stroke work up 4. TAAA - 1. 4.5cm on CT in Feb, Stable since 2016 2. Has AAA listed on his PMH, but only imaging I can find of abdominal aorta was in 2017 and showed moderate to severe atherosclerosis, but "no aneurysmal dilation". 5. CKD stage 3 - Creat 1.6, stable  DVT prophylaxis: Coumadin Code Status: Full Family Communication: No family in room Disposition Plan: Home after admit Consults called: Neuro Admission status: Place in 7     Beyla Loney, [place] Hospitalists  How to contact the [facility] Attending or Consulting provider [place] or covering provider during after hours [place], for this patient?  1. Check the care team in [facility] and look for a) attending/consulting [facility] provider listed and b) the [facility] team listed 2. Log into [email or web address]  Amion Physician Scheduling and messaging for groups and whole hospitals  On call and physician scheduling software for group practices, residents, hospitalists and other medical providers for call, clinic, rotation and shift schedules. On[employer] is a hospital-wide system for scheduling doctors and paging doctors on call. EasyPlot is for scientific plotting and data analysis.   [email or web address]  and use [place]'s universal password to access. If you do not have the password, please contact the hospital operator.  3. Locate the [facility] provider you are looking for under [facility] and page to a number that you can be directly reached. 4. If you still have difficulty reaching the provider, please page the [facility] (Director on Call) for the Hospitalists listed on amion for assistance.  03/15/2019, 8:34 PM

## 2019-03-15 NOTE — Progress Notes (Addendum)
ANTICOAGULATION CONSULT NOTE - Initial Consult  Pharmacy Consult for warfarin Indication: atrial fibrillation  Allergies  Allergen Reactions  . Flomax [Tamsulosin Hcl] Other (See Comments)    Can't pee    Patient Measurements: Height: 6\' 1"  (185.4 cm) Weight: 212 lb 4.9 oz (96.3 kg) IBW/kg (Calculated) : 79.9  Vital Signs: Temp: 98.3 F (36.8 C) (06/10 2006) Temp Source: Oral (06/10 2006) Pulse Rate: 73 (06/10 2006)  Labs: Recent Labs    03/15/19 1940 03/15/19 2003  HGB 13.5 14.3  HCT 41.8 42.0  PLT 155  --   APTT 39*  --   LABPROT 23.3*  --   INR 2.1*  --   CREATININE  --  1.70*    Estimated Creatinine Clearance: 37.5 mL/min (A) (by C-G formula based on SCr of 1.7 mg/dL (H)).   Medical History: Past Medical History:  Diagnosis Date  . AAA (abdominal aortic aneurysm) ([place])   . Aortic stenosis    a. mild-mod by echo 06/2016.  . Ascending aortic aneurysm ([place])   . Atrial fibrillation ([place])   . CKD (chronic kidney disease), stage III (HCC)    baseline Cr 1.4-1.5 in 2016-2017 by PCP)  . CVA (cerebral infarction)   . Depression   . DVT (deep venous thrombosis) ([place]) 2006  . Dyslipidemia   . Hematuria   . HOH (hard of hearing)   . Hypercholesterolemia   . OSA (obstructive sleep apnea)    intol to CPAP  . Permanent atrial fibrillation   . Prostate enlargement   . Pulmonary nodules    Assessment: Cesar [name] is an 85yo male admitted as code stroke, not a candidate for alteplase. PMH significant for atrial fibrillation. On warfarin PTA with INR 2.1 on admit. Patient already took dose today per EMS, so will hold off until tomorrow.  Goal of Therapy:  INR 2-3 Monitor platelets by anticoagulation protocol: Yes   Plan:  No warfarin tonight Monitor daily INR  Thank you for involving pharmacy in this patient's care.  Janae Bridgeman, PharmD PGY1 Pharmacy Resident Phone: [phone] 03/15/2019 8:20 PM

## 2019-03-15 NOTE — Consult Note (Signed)
Referring Physician: Dr. Kathrynn Humble    Chief Complaint: Left sided weakness  HPI: Cesar [name] is an 85 y.o. male presenting via EMS from home as a Code Stroke with acute onset of left sided weakness, left facial droop, abnormal gait and slurred speech. LKN was 6:40 PM. BP by EMS was 156/90. He has had TIAs in the past but no strokes. He takes warfarin for atrial fibrillation.   At the time of arrival to the ED, the patient's weakness had completely resolved. He had speech that was at times difficult to understand, but not clearly due to a dysarthria. Speech was without error of grammar or syntax with naming and comprehension intact.   STAT CT head reveals no hemorrhage. An old left frontal lobe small to medium sized cortically based ischemic infarction is noted.   LSN: 6:40 PM tPA Given: No: On warfarin with INR of 2.1  Past Medical History:  Diagnosis Date  . AAA (abdominal aortic aneurysm) ([place])   . Aortic stenosis    a. mild-mod by echo 06/2016.  . Ascending aortic aneurysm ([place])   . Atrial fibrillation ([place])   . CKD (chronic kidney disease), stage III (HCC)    baseline Cr 1.4-1.5 in 2016-2017 by PCP)  . CVA (cerebral infarction)   . Depression   . DVT (deep venous thrombosis) ([place]) 2006  . Dyslipidemia   . Hematuria   . HOH (hard of hearing)   . Hypercholesterolemia   . OSA (obstructive sleep apnea)    intol to CPAP  . Permanent atrial fibrillation   . Prostate enlargement   . Pulmonary nodules     Past Surgical History:  Procedure Laterality Date  . EMBOLECTOMY Left 07/02/2016   Procedure: EMBOLECTOMY LEFT FEMORAL ARTERY;  Surgeon: [name], MD;  Location: [place];  Service: Vascular;  Laterality: Left;  . NM MYOCAR PERF WALL MOTION  08/24/2002   markedly positive,subtle lateral ischemia  . PROSTATE SURGERY    . US ECHOCARDIOGRAPHY  01/06/2012   mod. AOV ca+,mild to mod. AS,mod mostly posterior MAC-mild MR    Family History  Problem Relation Age of Onset  . Heart  failure Mother   . Diabetes Father   . Stroke Father    Social History:  reports that he quit smoking about 66 years ago. His smoking use included cigarettes. He has a 10.00 pack-year smoking history. He quit smokeless tobacco use about 42 years ago. He reports current alcohol use. He reports that he does not use drugs.  Allergies:  Allergies  Allergen Reactions  . Flomax [Tamsulosin Hcl] Other (See Comments)    Can't pee    Home Medications:  Proscar Coumadin Lipitor  ROS: Brief ROS as per HPI. Detailed ROS deferred in the context of acuity of presentation.   Physical Examination: There were no vitals taken for this visit.  HEENT: [place]/AT Lungs: Respirations unlabored Ext: Warm and well perfused.   Neurologic Examination: Mental Status: Awake and alert. Oriented to city, state, month and year but not day. Speech with dysarthric component that does not have a quality consistent with a lesional dysarthria. In this context, speech is fluent with intact comprehension and naming.  Cranial Nerves: II:  Visual field testing reveals homonymous left inferior quadrantanopsia and a homonymous left superior quadrant peripheral crescentic field cut. PERRL.  III,IV, VI: EOMI are intact with saccadic quality to pursuits noted. No ptosis. No nystagmus.  V,VII: Smile symmetric, facial FT sensation with extinction on the right to DSS VIII:  HOH IX,X: No hypophonia XI: Head is midline XII: midline tongue extension  Motor: BUE: 5/5 without asymmetry BLE: 5/5 without asymmetry No pronator drift No lower extremity drift Sensory: Extinction on the right to DSS.  Deep Tendon Reflexes:  1+ bilateral upper extremities Hypoactive patellar and achilles reflexes Plantars: Equivocal bilaterally Cerebellar: No ataxia with FNF bilaterally Gait: Deferred   No results found for this or any previous visit (from the past 48 hour(s)). No results found.  Assessment: 85 y.o. male presenting with acute  onset of left sided weakness, left facial droop, abnormal gait and slurred speech. Symptoms resolved on arrival to the ED.  1. Exam reveals homonymous left inferior quadrantanopsia and a homonymous left superior quadrant peripheral crescentic field cut. Also noted is right sided sensory extinction. No motor deficit or ataxia noted.  2. CT head negative for acute abnormality. Old left MCA territory infarcts are noted.  3. Not a tPA candidate due to warfarin use with INR of 2.1 4. Not a VIR candidate due to low NIHSS. Benefits of proceeding for possible acute quadrantanopsia are felt to be significantly outweighed by average risk of ICH of about 10% with VIR, which in this case would be higher given anticoagulation with warfarin.  5. Stroke Risk Factors - atrial fibrillation, prior stroke, DVT, hypercholesterolemia and OSA  Plan: 1. HgbA1c, fasting lipid panel 2. MRI, MRA of the brain without contrast 3. PT consult, OT consult, Speech consult 4. Echocardiogram 5. Carotid dopplers 6. Prophylactic therapy- Continue warfarin. Monitor INR.  7. Risk factor modification 8. Telemetry monitoring 9. Frequent neuro checks 10. Continue Lipitor 11. Modified permissive HTN protocol with SBP goal of < 180 given anticoagulation with warfarin as well as advanced age.     _0  signed: Dr. Kerney Elbe  03/15/2019, 7:47 PM

## 2019-03-15 NOTE — ED Provider Notes (Signed)
[facility] EMERGENCY DEPARTMENT Provider Note   CSN: [id number] Arrival date & time: 03/15/19  1939  An emergency department physician performed an initial assessment on this suspected stroke patient at 84.  History   Chief Complaint Chief Complaint  Patient presents with  . Code Stroke    HPI Cesar [name] is a 85 y.o. male.     HPI  85 year old with history of AAA, aortic stenosis, CKD, A. fib on Coumadin, stroke without significant residual deficits and other metabolic disorders comes in with chief complaint of left-sided weakness and slurred speech.  Patient reports that around 1830 he was laying down when suddenly he started noticing weakness on the left side.  Wife called EMS when she also noticed left-sided facial droop along with slurring of the speech.  According to EMS patient also had a left arm drift when the assessed him, however in route they noted improvement in his symptoms.  Patient denies any severe headaches, neck pain, recent illnesses and states that he has been compliant with his medications.  Past Medical History:  Diagnosis Date  . AAA (abdominal aortic aneurysm) ([place])   . Aortic stenosis    a. mild-mod by echo 06/2016.  . Ascending aortic aneurysm ([place])   . Atrial fibrillation ([place])   . CKD (chronic kidney disease), stage III (HCC)    baseline Cr 1.4-1.5 in 2016-2017 by PCP)  . CVA (cerebral infarction)   . Depression   . DVT (deep venous thrombosis) ([place]) 2006  . Dyslipidemia   . Hematuria   . HOH (hard of hearing)   . Hypercholesterolemia   . OSA (obstructive sleep apnea)    intol to CPAP  . Permanent atrial fibrillation   . Prostate enlargement   . Pulmonary nodules     Patient Active Problem List   Diagnosis Date Noted  . Acute ischemic stroke ([place]) 03/15/2019  . PAD (peripheral artery disease) ([place]) 07/23/2018  . Ischemia of lower extremity 07/12/2016  . Acute blood loss anemia 07/12/2016  . Depression  07/12/2016  . BPH (benign prostatic hyperplasia) 07/12/2016  . AAA (abdominal aortic aneurysm) ([place])   . Atrial fibrillation ([place])   . Embolism ([place]) 07/02/2016  . CKD (chronic kidney disease), stage III ([place])   . Claudication ([place]) 06/04/2016  . Long term current use of anticoagulant 06/04/2016  . Abnormal ECG 06/04/2016  . Thoracic ascending aortic aneurysm ([place]) 04/22/2015  . Permanent atrial fibrillation ([place]) 05/11/2013  . Mild aortic stenosis 05/11/2013  . Orthostatic hypotension 05/11/2013  . Hypercholesterolemia 05/11/2013    Past Surgical History:  Procedure Laterality Date  . EMBOLECTOMY Left 07/02/2016   Procedure: EMBOLECTOMY LEFT FEMORAL ARTERY;  Surgeon: [name], MD;  Location: [facility];  Service: Vascular;  Laterality: Left;  . NM MYOCAR PERF WALL MOTION  08/24/2002   markedly positive,subtle lateral ischemia  . PROSTATE SURGERY    . US ECHOCARDIOGRAPHY  01/06/2012   mod. AOV ca+,mild to mod. AS,mod mostly posterior MAC-mild MR        Home Medications    Prior to Admission medications   Medication Sig Start Date End Date Taking? Authorizing Provider  atorvastatin (LIPITOR) 40 MG tablet Take 1 tablet by mouth daily. 07/13/18   [provider]  finasteride (PROSCAR) 5 MG tablet Take 5 mg by mouth daily.    [provider]  warfarin (COUMADIN) 7.5 MG tablet Take 7.5 mg by mouth daily.     [provider]    [facility]  History Family History  Problem Relation Age of Onset  . Heart failure Mother   . Diabetes Father   . Stroke Father     Social History Social History   Tobacco Use  . Smoking status: Former Smoker    Packs/day: 1.00    Years: 10.00    Pack years: 10.00    Types: Cigarettes    Last attempt to quit: 10/05/1952    Years since quitting: 66.4  . Smokeless tobacco: Former Systems developer    Quit date: 10/05/1976  Substance Use Topics  . Alcohol use: Yes    Alcohol/week: 0.0 standard drinks  . Drug use: No     Allergies    Flomax [tamsulosin hcl]   Review of Systems Review of Systems  Constitutional: Positive for activity change.  Cardiovascular: Negative for chest pain.  Neurological: Positive for speech difficulty and weakness.  Hematological: Bruises/bleeds easily.  All other systems reviewed and are negative.    Physical Exam Updated Vital Signs Pulse 73   Temp 98.3 F (36.8 C) (Oral)   Resp (!) 21   Ht _0  (1.854 m)   Wt 96.3 kg   SpO2 98%   BMI 28.01 kg/m   Physical Exam Vitals signs and nursing note reviewed.  Constitutional:      Appearance: He is well-developed.  HENT:     Head: Atraumatic.  Neck:     Musculoskeletal: Neck supple.  Cardiovascular:     Rate and Rhythm: Normal rate.  Pulmonary:     Effort: Pulmonary effort is normal.  Skin:    General: Skin is warm.  Neurological:     Mental Status: He is alert and oriented to person, place, and time.     Comments: Patient has slight slurring of his speech otherwise he has no upper or lower extremity sensory deficits, and the motor strength is 4+ out of 5 for both upper and lower extremities.  There is no nystagmus appreciated and cerebellar exam with finger-to-nose reveals no dysmetria.      ED Treatments / Results  Labs (all labs ordered are listed, but only abnormal results are displayed) Labs Reviewed  PROTIME-INR - Abnormal; Notable for the following components:      Result Value   Prothrombin Time 23.3 (*)    INR 2.1 (*)    All other components within normal limits  APTT - Abnormal; Notable for the following components:   aPTT 39 (*)    All other components within normal limits  COMPREHENSIVE METABOLIC PANEL - Abnormal; Notable for the following components:   Glucose, Bld 106 (*)    BUN 24 (*)    Creatinine, Ser 1.58 (*)    Calcium 8.8 (*)    Albumin 3.4 (*)    GFR calc non Af Amer 39 (*)    GFR calc Af Amer 45 (*)    All other components within normal limits  I-STAT CHEM 8, ED - Abnormal; Notable for  the following components:   BUN 27 (*)    Creatinine, Ser 1.70 (*)    Glucose, Bld 100 (*)    Calcium, Ion 1.06 (*)    All other components within normal limits  NOVEL CORONAVIRUS, NAA (HOSPITAL ORDER, SEND-OUT TO REF LAB)  CBC  DIFFERENTIAL  ETHANOL  RAPID URINE DRUG SCREEN, HOSP PERFORMED  URINALYSIS, ROUTINE W REFLEX MICROSCOPIC  HEMOGLOBIN A1C  LIPID PANEL  PROTIME-INR  I-STAT TROPONIN, ED    EKG None  Radiology Ct Head Code Stroke Wo Contrast  Result Date: 03/15/2019 CLINICAL DATA:  Code stroke. Left arm drift and slurred speech. Last seen well 18 40 EXAM: CT HEAD WITHOUT CONTRAST TECHNIQUE: Contiguous axial images were obtained from the base of the skull through the vertex without intravenous contrast. COMPARISON:  01/01/2012 FINDINGS: Brain: There is no mass, hemorrhage or extra-axial collection. The size and configuration of the ventricles and extra-axial CSF spaces are normal. There is an old left frontal lobe infarct and a left caudate head old lacunar infarct. There is periventricular hypoattenuation compatible with chronic microvascular disease. Vascular: Atherosclerotic calcification of the internal carotid arteries at the skull base. No abnormal hyperdensity of the major intracranial arteries or dural venous sinuses. Skull: The visualized skull base, calvarium and extracranial soft tissues are normal. Sinuses/Orbits: No fluid levels or advanced mucosal thickening of the visualized paranasal sinuses. No mastoid or middle ear effusion. The orbits are normal. ASPECTS [facility] Stroke Program Early CT Score) - Ganglionic level infarction (caudate, lentiform nuclei, internal capsule, insula, M1-M3 cortex): 7 - Supraganglionic infarction (M4-M6 cortex): 3 Total score (0-10 with 10 being normal): 10 IMPRESSION: 1. No acute hemorrhage. 2. ASPECTS is 10. 3. Old left MCA territory infarcts. These results were communicated to Dr. Kerney Elbe at 7:55 pm on 03/15/2019 by text page via the  [facility] messaging system. Electronically Signed   By: Ulyses Jarred M.D.   On: 03/15/2019 19:56    Procedures Procedures (including critical care time)  Medications Ordered in ED Medications  atorvastatin (LIPITOR) tablet 40 mg (has no administration in time range)   stroke: mapping our early stages of recovery book (has no administration in time range)  acetaminophen (TYLENOL) tablet 650 mg (has no administration in time range)    Or  acetaminophen (TYLENOL) solution 650 mg (has no administration in time range)    Or  acetaminophen (TYLENOL) suppository 650 mg (has no administration in time range)  Warfarin - Pharmacist Dosing Inpatient (has no administration in time range)     Initial Impression / Assessment and Plan / ED Course  I have reviewed the triage vital signs and the nursing notes.  Pertinent labs & imaging results that were available during my care of the patient were reviewed by me and considered in my medical decision making (see chart for details).        85 year old comes in a chief complaint of left-sided weakness, slurred speech and facial droop. On our evaluation he has a subtle facial droop and perhaps a little bit of slurring of his speech.  It is clear that the weakness in the upper extremity and gross facial droop has improved, EMS have also noted improvement in route to the ER.  He has a lot of medical comorbidities including life-threatening conditions like AAA.  He is not complaining of chest pain, and is hemodynamically stable.  It does not appear that the underlying cause is because of his AAA.   Code stroke was activated and neurology team is assessed the patient.  He is not noted to have a LVO, therefore patient is not eligible for any neuro intervention.  His INR is higher than 1.8, therefore with evolving symptoms and improvement in his symptoms, TPA will not be given.  Medicine to admit the patient.  Final Clinical Impressions(s) / ED Diagnoses    Final diagnoses:  Acute ischemic stroke [facility])    ED Discharge Orders    None       Varney Biles, MD 03/15/19 2041

## 2019-03-15 NOTE — ED Notes (Signed)
ED TO INPATIENT HANDOFF REPORT  ED Nurse Name and Phone #: [phone]  S Name/Age/Gender Cesar [name] 85 y.o. male Room/Bed: 031C/031C  Code Status   Code Status: Full Code  Home/SNF/Other Home  AO x4   Triage Complete: Triage complete  Chief Complaint Code Stroke    Triage Note Pt brought to ED by [facility] from home for stroke like symptoms, pt is chronic Afib on Coumadin, last seen well by wife at [facility] close to 1900 pt started getting left side facial droop, slurred speech and left arm drift. Symptoms got better on the way to ED. Code stroke called at 19:30.CBG 99, BP 190/100, HR 70-100 irregular, respiration 18, pt is 98% on RA. Pt AO x 4 on arrival to ED some sensation changes on the left side and visual field on the left side.   Allergies Allergies  Allergen Reactions  . Flomax [Tamsulosin Hcl] Other (See Comments)    Can't pee    Level of Care/Admitting Diagnosis ED Disposition    ED Disposition Condition Comment   Admit  Hospital Area: [facility] [[id number]]  Level of Care: Telemetry Medical [104]  I expect the patient will be discharged within 24 hours: No (not a candidate for 5C-Observation unit)  Covid Evaluation: Screening Protocol (No Symptoms)  Diagnosis: Acute ischemic stroke [facility]) [[id number]]  Admitting Physician: Etta Quill [phone]  Attending Physician: Etta Quill [[id number]]  PT Class (Do Not Modify): Observation [104]  PT Acc Code (Do Not Modify): Observation [10022]       B Medical/Surgery History Past Medical History:  Diagnosis Date  . AAA (abdominal aortic aneurysm) ([place])    Possibly an erroneous entry: only imaging i see is 2017 and specifically shows "no aneurysmal dilation" of abdominal aorta  . Aortic stenosis    a. mild-mod by echo 06/2016.  Mar[place] Kitchen Atrial fibrillation ([place])   . CKD (chronic kidney disease), stage III (HCC)    baseline Cr 1.4-1.5 in 2016-2017 by PCP)  . CVA (cerebral infarction)   . Depression   . DVT  (deep venous thrombosis) ([place]) 2006  . Dyslipidemia   . Hematuria   . HOH (hard of hearing)   . Hypercholesterolemia   . OSA (obstructive sleep apnea)    intol to CPAP  . Permanent atrial fibrillation   . Prostate enlargement   . Pulmonary nodules   . Thoracic ascending aortic aneurysm ([place])    4.5cm in 11/2018, stable since 2016   Past Surgical History:  Procedure Laterality Date  . EMBOLECTOMY Left 07/02/2016   Procedure: EMBOLECTOMY LEFT FEMORAL ARTERY;  Surgeon: [name], MD;  Location: [facility];  Service: Vascular;  Laterality: Left;  . NM MYOCAR PERF WALL MOTION  08/24/2002   markedly positive,subtle lateral ischemia  . PROSTATE SURGERY    . US ECHOCARDIOGRAPHY  01/06/2012   mod. AOV ca+,mild to mod. AS,mod mostly posterior MAC-mild MR     A IV Location/Drains/Wounds Patient Lines/Drains/Airways Status   Active Line/Drains/Airways    Name:   Placement date:   Placement time:   Site:   Days:   Incision (Closed) 07/02/16 Groin Left   07/02/16    0724     986          Intake/Output Last 24 hours No intake or output data in the 24 hours ending 03/15/19 2112  Labs/Imaging Results for orders placed or performed during the hospital encounter of 03/15/19 (from the past 48 hour(s))  Ethanol  Status: None   Collection Time: 03/15/19  7:40 PM  Result Value Ref Range   Alcohol, Ethyl (B) <10 <10 mg/dL    Comment: (NOTE) Lowest detectable limit for serum alcohol is 10 mg/dL. For medical purposes only. Performed at [facility], [place] [address], [place], [place] [address]   Protime-INR     Status: Abnormal   Collection Time: 03/15/19  7:40 PM  Result Value Ref Range   Prothrombin Time 23.3 (H) 11.4 - 15.2 seconds   INR 2.1 (H) 0.8 - 1.2    Comment: (NOTE) INR goal varies based on device and disease states. Performed at [facility], [place] [address] [place], [place] [address]   APTT     Status: Abnormal   Collection Time: 03/15/19  7:40 PM  Result Value  Ref Range   aPTT 39 (H) 24 - 36 seconds    Comment:        IF BASELINE aPTT IS ELEVATED, SUGGEST PATIENT RISK ASSESSMENT BE USED TO DETERMINE APPROPRIATE ANTICOAGULANT THERAPY. Performed at [facility], [place] [address], [place], [place] [address]   CBC     Status: None   Collection Time: 03/15/19  7:40 PM  Result Value Ref Range   WBC 6.0 4.0 - 10.5 K/uL   RBC 4.34 4.22 - 5.81 MIL/uL   Hemoglobin 13.5 13.0 - 17.0 g/dL   HCT 41.8 39.0 - 52.0 %   MCV 96.3 80.0 - 100.0 fL   MCH 31.1 26.0 - 34.0 pg   MCHC 32.3 30.0 - 36.0 g/dL   RDW 12.7 11.5 - 15.5 %   Platelets 155 150 - 400 K/uL   nRBC 0.0 0.0 - 0.2 %    Comment: Performed at [facility], [place] [address], [place], [place] [address]  Differential     Status: None   Collection Time: 03/15/19  7:40 PM  Result Value Ref Range   Neutrophils Relative % 62 %   Neutro Abs 3.7 1.7 - 7.7 K/uL   Lymphocytes Relative 23 %   Lymphs Abs 1.4 0.7 - 4.0 K/uL   Monocytes Relative 8 %   Monocytes Absolute 0.5 0.1 - 1.0 K/uL   Eosinophils Relative 6 %   Eosinophils Absolute 0.4 0.0 - 0.5 K/uL   Basophils Relative 1 %   Basophils Absolute 0.1 0.0 - 0.1 K/uL   Immature Granulocytes 0 %   Abs Immature Granulocytes 0.01 0.00 - 0.07 K/uL    Comment: Performed at [facility], [place] [address] [place],  [address]  Comprehensive metabolic panel     Status: Abnormal   Collection Time: 03/15/19  7:40 PM  Result Value Ref Range   Sodium 139 135 - 145 mmol/L   Potassium 4.6 3.5 - 5.1 mmol/L   Chloride 107 98 - 111 mmol/L   CO2 24 22 - 32 mmol/L   Glucose, Bld 106 (H) 70 - 99 mg/dL   BUN 24 (H) 8 - 23 mg/dL   Creatinine, Ser 1.58 (H) 0.61 - 1.24 mg/dL   Calcium 8.8 (L) 8.9 - 10.3 mg/dL   Total Protein 6.6 6.5 - 8.1 g/dL   Albumin 3.4 (L) 3.5 - 5.0 g/dL   AST 18 15 - 41 U/L   ALT 15 0 - 44 U/L   Alkaline Phosphatase 50 38 - 126 U/L   Total Bilirubin 0.8 0.3 - 1.2 mg/dL   GFR calc non Af Amer 39 (L) >60 mL/min  GFR calc  Af Amer 45 (L) >60 mL/min   Anion gap 8 5 - 15    Comment: Performed at [facility] [address], [place], [place] [address]  I-Stat Troponin, ED (not at [facility])     Status: None   Collection Time: 03/15/19  7:57 PM  Result Value Ref Range   Troponin i, poc 0.02 0.00 - 0.08 ng/mL   Comment 3            Comment: Due to the release kinetics of cTnI, a negative result within the first hours of the onset of symptoms does not rule out myocardial infarction with certainty. If myocardial infarction is still suspected, repeat the test at appropriate intervals.   I-stat chem 8, ED     Status: Abnormal   Collection Time: 03/15/19  8:03 PM  Result Value Ref Range   Sodium 139 135 - 145 mmol/L   Potassium 4.9 3.5 - 5.1 mmol/L   Chloride 107 98 - 111 mmol/L   BUN 27 (H) 8 - 23 mg/dL   Creatinine, Ser 1.70 (H) 0.61 - 1.24 mg/dL   Glucose, Bld 100 (H) 70 - 99 mg/dL   Calcium, Ion 1.06 (L) 1.15 - 1.40 mmol/L   TCO2 26 22 - 32 mmol/L   Hemoglobin 14.3 13.0 - 17.0 g/dL   HCT 42.0 39.0 - 52.0 %   Dg Chest 2 View  Result Date: 03/15/2019 CLINICAL DATA:  Acute ischemic stroke EXAM: CHEST - 2 VIEW COMPARISON:  02/05/2010 chest radiograph.  11/23/2018 chest CT. FINDINGS: Stable cardiomediastinal silhouette with mild cardiomegaly. No pneumothorax. No pleural effusion. Lungs appear clear, with no acute consolidative airspace disease and no pulmonary edema. IMPRESSION: Stable mild cardiomegaly without pulmonary edema. No active pulmonary disease. Electronically Signed   By: Ilona Sorrel M.D.   On: 03/15/2019 20:45   Ct Head Code Stroke Wo Contrast  Result Date: 03/15/2019 CLINICAL DATA:  Code stroke. Left arm drift and slurred speech. Last seen well 18 40 EXAM: CT HEAD WITHOUT CONTRAST TECHNIQUE: Contiguous axial images were obtained from the base of the skull through the vertex without intravenous contrast. COMPARISON:  01/01/2012 FINDINGS: Brain: There is no mass, hemorrhage or extra-axial  collection. The size and configuration of the ventricles and extra-axial CSF spaces are normal. There is an old left frontal lobe infarct and a left caudate head old lacunar infarct. There is periventricular hypoattenuation compatible with chronic microvascular disease. Vascular: Atherosclerotic calcification of the internal carotid arteries at the skull base. No abnormal hyperdensity of the major intracranial arteries or dural venous sinuses. Skull: The visualized skull base, calvarium and extracranial soft tissues are normal. Sinuses/Orbits: No fluid levels or advanced mucosal thickening of the visualized paranasal sinuses. No mastoid or middle ear effusion. The orbits are normal. ASPECTS [facility] Stroke Program Early CT Score) - Ganglionic level infarction (caudate, lentiform nuclei, internal capsule, insula, M1-M3 cortex): 7 - Supraganglionic infarction (M4-M6 cortex): 3 Total score (0-10 with 10 being normal): 10 IMPRESSION: 1. No acute hemorrhage. 2. ASPECTS is 10. 3. Old left MCA territory infarcts. These results were communicated to Dr. Kerney Elbe at 7:55 pm on 03/15/2019 by text page via the [facility] messaging system. Electronically Signed   By: Ulyses Jarred M.D.   On: 03/15/2019 19:56    Pending Labs Unresulted Labs (From admission, onward)    Start     Ordered   03/16/19 0500  Hemoglobin A1c  Tomorrow morning,   R     03/15/19  2021   03/16/19 0500  Lipid panel  Tomorrow morning,   R    Comments:  Fasting    03/15/19 2021   03/16/19 0500  Protime-INR  Daily,   R     03/15/19 2023   03/15/19 2002  Novel Coronavirus,NAA,(SEND-OUT TO REF LAB - TAT 24-48 hrs); Hosp Order  (Asymptomatic Patients Labs)  Once,   R    Question:  Rule Out  Answer:  Yes   03/15/19 2001   03/15/19 1940  Urine rapid drug screen (hosp performed)  ONCE - STAT,   STAT     03/15/19 1940   03/15/19 1940  Urinalysis, Routine w reflex microscopic  ONCE - STAT,   STAT     03/15/19 1940          Vitals/Pain Today's  Vitals   03/15/19 2006 03/15/19 2007 03/15/19 2015 03/15/19 2045  BP:   (!) 157/73 (!) 142/97  Pulse: 73  74 76  Resp: (!) 21  (!) 29 (!) 25  Temp: 98.3 F (36.8 C)     TempSrc: Oral     SpO2: 98%  97% 97%  Weight:  96.3 kg    Height:  _0  (1.854 m)    PainSc:  0-No pain      Isolation Precautions No active isolations  Medications Medications  atorvastatin (LIPITOR) tablet 40 mg (has no administration in time range)   stroke: mapping our early stages of recovery book (has no administration in time range)  acetaminophen (TYLENOL) tablet 650 mg (has no administration in time range)    Or  acetaminophen (TYLENOL) solution 650 mg (has no administration in time range)    Or  acetaminophen (TYLENOL) suppository 650 mg (has no administration in time range)  Warfarin - Pharmacist Dosing Inpatient (has no administration in time range)    Mobility One assist Low fall risk   Focused Assessments NIH 2, mostly visual fields and left side sensation.   R Recommendations: See Admitting Provider Note  Report given to:   Additional Notes:

## 2019-03-15 NOTE — ED Triage Notes (Signed)
Pt brought to ED by [facility] from home for stroke like symptoms, pt is chronic Afib on Coumadin, last seen well by wife at [facility] close to 1900 pt started getting left side facial droop, slurred speech and left arm drift. Symptoms got better on the way to ED. Code stroke called at 19:30.CBG 99, BP 190/100, HR 70-100 irregular, respiration 18, pt is 98% on RA. Pt AO x 4 on arrival to ED some sensation changes on the left side and visual field on the left side.

## 2019-03-16 ENCOUNTER — Observation Stay (HOSPITAL_BASED_OUTPATIENT_CLINIC_OR_DEPARTMENT_OTHER): Payer: Medicare Other

## 2019-03-16 ENCOUNTER — Observation Stay (HOSPITAL_COMMUNITY): Payer: Medicare Other

## 2019-03-16 DIAGNOSIS — I639 Cerebral infarction, unspecified: Secondary | ICD-10-CM

## 2019-03-16 DIAGNOSIS — I6521 Occlusion and stenosis of right carotid artery: Secondary | ICD-10-CM | POA: Diagnosis not present

## 2019-03-16 DIAGNOSIS — N183 Chronic kidney disease, stage 3 (moderate): Secondary | ICD-10-CM | POA: Diagnosis not present

## 2019-03-16 DIAGNOSIS — I352 Nonrheumatic aortic (valve) stenosis with insufficiency: Secondary | ICD-10-CM

## 2019-03-16 LAB — PROTIME-INR
INR: 2.3 — ABNORMAL HIGH (ref 0.8–1.2)
Prothrombin Time: 24.9 seconds — ABNORMAL HIGH (ref 11.4–15.2)

## 2019-03-16 LAB — URINALYSIS, ROUTINE W REFLEX MICROSCOPIC
Bilirubin Urine: NEGATIVE
Glucose, UA: NEGATIVE mg/dL
Ketones, ur: NEGATIVE mg/dL
Nitrite: NEGATIVE
Protein, ur: 30 mg/dL — AB
Specific Gravity, Urine: 1.015 (ref 1.005–1.030)
WBC, UA: >50 WBC/hpf — ABNORMAL HIGH (ref 0–5)
pH: 6 (ref 5.0–8.0)

## 2019-03-16 LAB — RAPID URINE DRUG SCREEN, HOSP PERFORMED
Amphetamines: NOT DETECTED
Barbiturates: NOT DETECTED
Benzodiazepines: NOT DETECTED
Cocaine: NOT DETECTED
Opiates: NOT DETECTED
Tetrahydrocannabinol: NOT DETECTED

## 2019-03-16 LAB — ECHOCARDIOGRAM COMPLETE
Height: 73 in
Weight: 3396.85 oz

## 2019-03-16 LAB — LIPID PANEL
Cholesterol: 154 mg/dL (ref 0–200)
HDL: 38 mg/dL — ABNORMAL LOW (ref >40)
LDL Cholesterol: 94 mg/dL (ref 0–99)
Total CHOL/HDL Ratio: 4.1 RATIO
Triglycerides: 108 mg/dL (ref <150)
VLDL: 22 mg/dL (ref 0–40)

## 2019-03-16 LAB — HEMOGLOBIN A1C
Hgb A1c MFr Bld: 5.3 % (ref 4.8–5.6)
Mean Plasma Glucose: 105.41 mg/dL

## 2019-03-16 LAB — SARS CORONAVIRUS 2: SARS Coronavirus 2: NOT DETECTED

## 2019-03-16 MED ORDER — WARFARIN SODIUM 7.5 MG PO TABS: 7.5000 mg | ORAL_TABLET | Freq: Once | ORAL | Status: DC

## 2019-03-16 MED ORDER — ATORVASTATIN CALCIUM 80 MG PO TABS: 80.0000 mg | ORAL_TABLET | Freq: Every day | ORAL | 3 refills | Status: DC

## 2019-03-16 MED ORDER — ATORVASTATIN CALCIUM 80 MG PO TABS: 80.0000 mg | ORAL_TABLET | Freq: Every day | ORAL | Status: DC

## 2019-03-16 NOTE — Progress Notes (Signed)
[facility] for warfarin Indication: atrial fibrillation  Allergies  Allergen Reactions  . Flomax [Tamsulosin Hcl] Other (See Comments)    Can't pee    Patient Measurements: Height: 6\' 1"  (185.4 cm) Weight: 212 lb 4.9 oz (96.3 kg) IBW/kg (Calculated) : 79.9  Vital Signs: Temp: 98.1 F (36.7 C) (06/11 1113) Temp Source: Oral (06/11 1113) BP: 129/61 (06/11 1113) Pulse Rate: 57 (06/11 1113)  Labs: Recent Labs    03/15/19 1940 03/15/19 2003 03/16/19 0446  HGB 13.5 14.3  --   HCT 41.8 42.0  --   PLT 155  --   --   APTT 39*  --   --   LABPROT 23.3*  --  24.9*  INR 2.1*  --  2.3*  CREATININE 1.58* 1.70*  --     Estimated Creatinine Clearance: 37.5 mL/min (A) (by C-G formula based on SCr of 1.7 mg/dL (H)).  Assessment: Cesar [name] is an 85yo male admitted as code stroke, not a candidate for alteplase. On warfarin PTA for history of afib. INR is therapeutic at 2.3. CBC is WNL and no bleeding noted.   PTA dose 7.5mg  daily except 5mg  on Tuesday + Saturday   Goal of Therapy:  INR 2-3 Monitor platelets by anticoagulation protocol: Yes   Plan:  Warfarin 7.5mg  PO x 1 tonight Daily INR  Salome Arnt, PharmD, BCPS Please see AMION for all pharmacy numbers 03/16/2019 2:20 PM

## 2019-03-16 NOTE — Progress Notes (Signed)
  Echocardiogram 2D Echocardiogram has been performed.  Cesar [name] 03/16/2019, 9:39 AM

## 2019-03-16 NOTE — Plan of Care (Signed)
Patient stable, discussed POC with patient and son, agreeable with plan for D/C w/PT/OT [facility] services. Discussed stroke workbook and applicable modifiable risk factors including hearth healthy diet and medication changes, denies question/concerns at this time.

## 2019-03-16 NOTE — Evaluation (Signed)
Occupational Therapy Evaluation Patient Details Name: Cesar [name] MRN: [id number] DOB: Oct 13, 1928 Today's Date: 03/16/2019    History of Present Illness Pt is an 85 y/o male who presents with L-side facial droop, slurred speech, L-side sensation changes, and LUE drift. MRI revealed a possible acute/subacute infarct. PMH significant for thoracic ascending aortic aneurysm, pulmonary nodules, a-fib, HOH, DVT in 2006, depression, CVA, CKD.   Clinical Impression   Pt PTA living at home with spouse mostly independent. Pt currently performing ADL functional mobility with  SPC or hand held assist +2. Pt performing ADL functional transfers with minguardA. Pt supervisionA with ADL tasks in sitting mostly. Pt tolerating session well despite HOH, pt able to follow commands. Son in room to translate or help communicate. RUE with slight weakness 3+/5 MM grade compared to LUE 4-/5 MM grade. OT did not notice visual deficits, but could be evaluated further. Pt would benefit from continued OT skilled services for ADL, mobility and safety in [place] setting. OT to follow acutely for energy conservation and safety.      Follow Up Recommendations  Home health OT;Supervision/Assistance - 24 hour    Equipment Recommendations  None recommended by OT    Recommendations for Other Services       Precautions / Restrictions Precautions Precautions: Fall Precaution Comments: Very HOH Restrictions Weight Bearing Restrictions: No      Mobility Bed Mobility Overal bed mobility: Needs Assistance Bed Mobility: Supine to Sit     Supine to sit: Supervision     General bed mobility comments: HOB slightly elevated. Pt was able to transition to EOB without assistance. Increased time required.   Transfers Overall transfer level: Needs assistance Equipment used: None Transfers: Sit to/from Stand Sit to Stand: Min guard         General transfer comment: Hands-on guarding for power-up to full stand. Pt  appeared slightly unsteady initially however no assist was required to avoid LOB.     Balance Overall balance assessment: Needs assistance Sitting-balance support: Feet supported;No upper extremity supported Sitting balance-Leahy Scale: Fair     Standing balance support: No upper extremity supported;During functional activity Standing balance-Leahy Scale: Poor Standing balance comment: Reliant on UE support for safe mobility                           ADL either performed or assessed with clinical judgement   ADL Overall ADL's : Needs assistance/impaired Eating/Feeding: Set up;Sitting   Grooming: Supervision/safety;Standing   Upper Body Bathing: Set up;Supervision/ safety;Standing   Lower Body Bathing: Supervison/ safety;Sitting/lateral leans;Sit to/from stand   Upper Body Dressing : Supervision/safety;Sitting;Standing   Lower Body Dressing: Supervision/safety;Sitting/lateral leans;Sit to/from Archivist: Supervision/safety;Regular Museum/gallery exhibitions officer and Hygiene: Supervision/safety;Sitting/lateral lean;Sit to/from stand       Functional mobility during ADLs: Minimal assistance;+2 for safety/equipment General ADL Comments: Pt supervision levelA for ADL and may take increased time due to fair activity tolerance     Vision Baseline Vision/History: No visual deficits Vision Assessment?: No apparent visual deficits     Perception     Praxis      Pertinent Vitals/Pain Pain Assessment: No/denies pain     Hand Dominance Left   Extremity/Trunk Assessment Upper Extremity Assessment Upper Extremity Assessment: Generalized weakness;RUE deficits/detail RUE Deficits / Details: 3+/5 MM grade in RUE versus 4-/5 in LUE RUE Coordination: decreased fine motor   Lower Extremity Assessment Lower Extremity Assessment: Defer to PT evaluation RLE  Deficits / Details: Slight decrease in strength in RLE (grossly 4/5 in quads, hamstrings,  hip flexors) vs 5/5 in LLE.  RLE Sensation: WNL;history of peripheral neuropathy RLE Coordination: (Bilaterally)   Cervical / Trunk Assessment Cervical / Trunk Assessment: Other exceptions Cervical / Trunk Exceptions: Forward head posture with rounded shoulders   Communication Communication Communication: HOH   Cognition Arousal/Alertness: Awake/alert Behavior During Therapy: WFL for tasks assessed/performed Overall Cognitive Status: Difficult to assess                                     General Comments  OTR did not notice visual changes, but pt may have compensated for them. Pt moving item on floor with foot while ambulating without LOB. pt followed commands and son in room to assist with relaying info    Exercises     Shoulder Instructions      Home Living Family/patient expects to be discharged to:: Private residence Living Arrangements: Spouse/significant other Available Help at Discharge: Family;Available 24 hours/day Type of Home: House Home Access: Stairs to enter [employer] of Steps: 1 Entrance Stairs-Rails: Right Home Layout: One level     Bathroom Shower/Tub: Tub/shower unit;Walk-in shower   Bathroom Toilet: Standard     Home Equipment: Environmental consultant - 2 wheels;Bedside commode;Cane - single point;Wheelchair - manual   Additional Comments: son reports mows the yard on a tractor  Lives With: Spouse    Prior Functioning/Environment Level of Independence: Independent                 OT Problem List: Decreased strength;Decreased activity tolerance;Impaired balance (sitting and/or standing);Decreased coordination;Decreased safety awareness;Pain;Impaired UE functional use      OT Treatment/Interventions: Self-care/ADL training;Therapeutic exercise;Neuromuscular education;Energy conservation;DME and/or AE instruction;Therapeutic activities;Patient/family education;Balance training    OT Goals(Current goals can be found in the care  plan section) Acute Rehab OT Goals Patient Stated Goal: Pt did not state goals. Son's goal is no more falls at home.  OT Goal Formulation: With patient Time For Goal Achievement: 03/30/19 Potential to Achieve Goals: Good ADL Goals Pt Will Perform Lower Body Dressing: with modified independence;sit to/from stand Pt Will Perform Toileting - Clothing Manipulation and hygiene: with modified independence;sit to/from stand Additional ADL Goal #1: Pt with be S level for OOB for ADL tasks  OT Frequency: Min 2X/week   Barriers to D/C:            Co-evaluation PT/OT/SLP Co-Evaluation/Treatment: Yes Reason for Co-Treatment: For patient/therapist safety PT goals addressed during session: Mobility/safety with mobility;Proper use of DME;Balance OT goals addressed during session: ADL's and self-care      AM-PAC OT "6 Clicks" Daily Activity     Outcome Measure Help from another person eating meals?: None Help from another person taking care of personal grooming?: A Little Help from another person toileting, which includes using toliet, bedpan, or urinal?: A Little Help from another person bathing (including washing, rinsing, drying)?: A Little Help from another person to put on and taking off regular upper body clothing?: A Little Help from another person to put on and taking off regular lower body clothing?: A Little 6 Click Score: 19   End of Session Equipment Utilized During Treatment: Gait belt;Other (comment)(SPC) Nurse Communication: Mobility status  Activity Tolerance: Patient tolerated treatment well Patient left: in chair;with call bell/phone within reach;with chair alarm set  OT Visit Diagnosis: Unsteadiness on feet (R26.81);Muscle weakness (generalized) (M62.81);Other symptoms and  signs involving the nervous system (R29.898)                Time: 3295-1884 OT Time Calculation (min): 31 min Charges:  OT General Charges $OT Visit: 1 Visit OT Evaluation $OT Eval Moderate  Complexity: 1 Mod  Darryl Nestle) Marsa Aris OTR/L Acute Rehabilitation Services Pager: [phone] Office: [phone]   Audie Pinto 03/16/2019, 3:16 PM

## 2019-03-16 NOTE — Progress Notes (Signed)
Attempted carotid artery duplex, however patient was in the chair with visitor in the room. Will attempt again later as schedule permits.  03/16/2019 11:04 AM Maudry Mayhew, MHA, RVT, RDCS, RDMS

## 2019-03-16 NOTE — Progress Notes (Signed)
CCMD contacted RN to advise that patient had a 2.71 second pause.  RN notified provider on call

## 2019-03-16 NOTE — Progress Notes (Signed)
Carotid artery duplex completed. Refer to "CV Proc" under chart review to view preliminary results.  03/16/2019 2:26 PM Maudry Mayhew, MHA, RVT, RDCS, RDMS

## 2019-03-16 NOTE — TOC Transition Note (Signed)
Transition of Care [facility]) - CM/SW Discharge Note   Patient Details  Name: Cesar [name] MRN: [id number] Date of Birth: 05/29/1929  Transition of Care [facility]) CM/SW Contact:  Pollie Friar, RN Phone Number: 03/16/2019, 3:56 PM   Clinical Narrative:    Pt discharging home with [facility] services. No DME needs. Wife will provide transport home.   Final next level of care: Home w Home Health Services Barriers to Discharge: No Barriers Identified   Patient Goals and CMS Choice   CMS Medicare.gov Compare Post Acute Care list provided to:: Patient Choice offered to / list presented to : Patient  Discharge Placement                       Discharge Plan and Services   Discharge Planning Services: CM Consult Post Acute Care Choice: Home Health                    HH Arranged: PT, OT [facility] Agency: [place] ([place]) Date [facility] Agency Contacted: 03/16/19 Time [place]: [place] Representative spoke with at [place]: [place] ([place]) Interventions     Readmission Risk Interventions No flowsheet data found.

## 2019-03-16 NOTE — Progress Notes (Signed)
Patient to unit from ED. Son present. No residual neuro symptoms at this time. Patient oriented to room and unit. He is very HOH. Telemetry applied and CCMD notified.

## 2019-03-16 NOTE — Evaluation (Signed)
Physical Therapy Evaluation Patient Details Name: Cesar [name] MRN: [id number] DOB: 1929-08-17 Today's Date: 03/16/2019   History of Present Illness  Pt is an 85 y/o male who presents with L-side facial droop, slurred speech, L-side sensation changes, and LUE drift. MRI revealed a possible acute/subacute infarct. PMH significant for thoracic ascending aortic aneurysm, pulmonary nodules, a-fib, HOH, DVT in 2006, depression, CVA, CKD.  Clinical Impression  Pt admitted with above diagnosis. Pt currently with functional limitations due to the deficits listed below (see PT Problem List). At the time of PT eval pt's son was present to assist with history 2 pt very HOH. PTA pt apparently resistant to using AD at home and has had several falls in the past 6 months. Son does state that gait today appears very close to baseline of function. Overall pt requiring up to +2 assist for safety with hallway ambulation. Appears improved with SPC and was recommended that pt continue to use SPC at home. Recommend HHPT also follow up upon d/c to maximize functional independence and decrease risk for falls at home. Pt will benefit from skilled PT to increase their independence and safety with mobility to allow discharge to the venue listed below.      Follow Up Recommendations Home health PT;Supervision/Assistance - 24 hour    Equipment Recommendations  None recommended by PT    Recommendations for Other Services       Precautions / Restrictions Precautions Precautions: Fall Precaution Comments: Very HOH Restrictions Weight Bearing Restrictions: No      Mobility  Bed Mobility Overal bed mobility: Needs Assistance Bed Mobility: Supine to Sit     Supine to sit: Supervision     General bed mobility comments: HOB slightly elevated. Pt was able to transition to EOB without assistance. Increased time required.   Transfers Overall transfer level: Needs assistance Equipment used: None Transfers: Sit  to/from Stand Sit to Stand: Min guard         General transfer comment: Hands-on guarding for power-up to full stand. Pt appeared slightly unsteady initially however no assist was required to avoid LOB.   Ambulation/Gait Ambulation/Gait assistance: Min assist;+2 safety/equipment Gait Distance (Feet): 200 Feet Assistive device: None;2 person hand held assist;Straight cane Gait Pattern/deviations: Step-through pattern;Decreased stride length;Trunk flexed Gait velocity: Decreased Gait velocity interpretation: <1.8 ft/sec, indicate of risk for recurrent falls General Gait Details: Pt initially without AD and required +2 assist for safety as pt shuffling and very unsteady. Improved with SPC however still required hands-on guarding for safety.   Stairs Stairs: Yes Stairs assistance: Min guard Stair Management: Two rails;Step to pattern;Forwards Number of Stairs: 5 General stair comments: Pt was able to complete practice stairs without difficulty. BUE use on rails required.   Wheelchair Mobility    Modified Rankin (Stroke Patients Only) Modified Rankin (Stroke Patients Only) Pre-Morbid Rankin Score: Slight disability Modified Rankin: Moderately severe disability     Balance Overall balance assessment: Needs assistance Sitting-balance support: Feet supported;No upper extremity supported Sitting balance-Leahy Scale: Fair     Standing balance support: No upper extremity supported;During functional activity Standing balance-Leahy Scale: Poor Standing balance comment: Reliant on UE support for safe mobility                             Pertinent Vitals/Pain Pain Assessment: No/denies pain    Home Living Family/patient expects to be discharged to:: Private residence Living Arrangements: Spouse/significant other Available Help at Discharge: Family;Available 24  hours/day Type of Home: House Home Access: Stairs to enter Entrance Stairs-Rails: Right Entrance  Stairs-Number of Steps: 1 Home Layout: One level Home Equipment: Walker - 2 wheels;Bedside commode;Cane - single point;Wheelchair - manual Additional Comments: son reports mows the yard on a tractor    Prior Function Level of Independence: Independent               Hand Dominance   Dominant Hand: Left    Extremity/Trunk Assessment   Upper Extremity Assessment Upper Extremity Assessment: Defer to OT evaluation    Lower Extremity Assessment Lower Extremity Assessment: RLE deficits/detail RLE Deficits / Details: Slight decrease in strength in RLE (grossly 4/5 in quads, hamstrings, hip flexors) vs 5/5 in LLE.  RLE Sensation: WNL;history of peripheral neuropathy RLE Coordination: decreased gross motor(Bilaterally)    Cervical / Trunk Assessment Cervical / Trunk Assessment: Other exceptions Cervical / Trunk Exceptions: Forward head posture with rounded shoulders  Communication   Communication: HOH  Cognition Arousal/Alertness: Awake/alert Behavior During Therapy: WFL for tasks assessed/performed Overall Cognitive Status: Within Functional Limits for tasks assessed                                        General Comments      Exercises     Assessment/Plan    PT Assessment Patient needs continued PT services  PT Problem List Decreased strength;Decreased activity tolerance;Decreased balance;Decreased mobility;Decreased coordination;Decreased knowledge of use of DME;Decreased safety awareness;Decreased knowledge of precautions;Impaired sensation       PT Treatment Interventions DME instruction;Gait training;Stair training;Functional mobility training;Therapeutic activities;Therapeutic exercise;Neuromuscular re-education;Patient/family education    PT Goals (Current goals can be found in the Care Plan section)  Acute Rehab PT Goals Patient Stated Goal: Pt did not state goals. Son's goal is no more falls at home.  PT Goal Formulation: With patient Time  For Goal Achievement: 03/23/19 Potential to Achieve Goals: Good    Frequency Min 4X/week   Barriers to discharge        Co-evaluation PT/OT/SLP Co-Evaluation/Treatment: Yes Reason for Co-Treatment: For patient/therapist safety;To address functional/ADL transfers PT goals addressed during session: Mobility/safety with mobility;Proper use of DME;Balance         AM-PAC PT "6 Clicks" Mobility  Outcome Measure Help needed turning from your back to your side while in a flat bed without using bedrails?: None Help needed moving from lying on your back to sitting on the side of a flat bed without using bedrails?: A Little Help needed moving to and from a bed to a chair (including a wheelchair)?: A Little Help needed standing up from a chair using your arms (e.g., wheelchair or bedside chair)?: A Little Help needed to walk in hospital room?: A Little Help needed climbing 3-5 steps with a railing? : A Little 6 Click Score: 19    End of Session Equipment Utilized During Treatment: Gait belt Activity Tolerance: Patient tolerated treatment well Patient left: in chair;with call bell/phone within reach;with chair alarm set;with family/visitor present Nurse Communication: Mobility status PT Visit Diagnosis: Unsteadiness on feet (R26.81);Other symptoms and signs involving the nervous system (R29.898)    Time: 5176-1607 PT Time Calculation (min) (ACUTE ONLY): 29 min   Charges:   PT Evaluation $PT Eval Moderate Complexity: 1 Mod          Rolinda Roan, PT, DPT Acute Rehabilitation Services Pager: [phone] Office: [phone]   Thelma Comp 03/16/2019, 12:24 PM

## 2019-03-16 NOTE — TOC Initial Note (Signed)
Transition of Care [facility]) - Initial/Assessment Note    Patient Details  Name: Cesar [name] MRN: [id number] Date of Birth: 1929-10-02  Transition of Care [facility]) CM/SW Contact:    Pollie Friar, RN Phone Number: 03/16/2019, 3:55 PM  Clinical Narrative:                   Expected Discharge Plan: [place] Barriers to Discharge: No Barriers Identified   Patient Goals and CMS Choice   CMS Medicare.gov Compare Post Acute Care list provided to:: Patient Choice offered to / list presented to : Patient  Expected Discharge Plan and Services Expected Discharge Plan: [place]   Discharge Planning Services: CM Consult Post Acute Care Choice: Home Health   Expected Discharge Date: 03/16/19                         HH Arranged: PT, OT HH Agency: [place] ([place]) Date [place]: 03/16/19 Time [place]: [place] Representative spoke with at [place]: Butch Penny  Prior Living Arrangements/Services       Do you feel safe going back to the place where you live?: Yes            Criminal Activity/Legal Involvement Pertinent to Current Situation/Hospitalization: No - Comment as needed  Activities of Daily Living      Permission Sought/Granted                  Emotional Assessment       Orientation: : Oriented to Self, Oriented to Place, Oriented to  Time, Oriented to Situation   Psych Involvement: No (comment)  Admission diagnosis:  Acute ischemic stroke [facility]) [I63.9] Patient Active Problem List   Diagnosis Date Noted  . Acute ischemic stroke ([place]) 03/15/2019  . PAD (peripheral artery disease) ([place]) 07/23/2018  . Ischemia of lower extremity 07/12/2016  . Acute blood loss anemia 07/12/2016  . Depression 07/12/2016  . BPH (benign prostatic hyperplasia) 07/12/2016  . AAA (abdominal aortic aneurysm) ([place])   . Atrial fibrillation ([place])   . Embolism ([place]) 07/02/2016  . CKD (chronic kidney  disease), stage III ([place])   . Claudication ([place]) 06/04/2016  . Long term current use of anticoagulant 06/04/2016  . Abnormal ECG 06/04/2016  . Thoracic ascending aortic aneurysm ([place]) 04/22/2015  . Permanent atrial fibrillation ([place]) 05/11/2013  . Mild aortic stenosis 05/11/2013  . Orthostatic hypotension 05/11/2013  . Hypercholesterolemia 05/11/2013   PCP:  Orpah Melter, MD Pharmacy:   [place], [place] - [address] [place] [address] Phone: [phone] Fax: [place] [address] [place] [address] Phone: [phone] Fax: [phone]     Social Determinants of Health (SDOH) Interventions    Readmission Risk Interventions No flowsheet data found.

## 2019-03-16 NOTE — Discharge Summary (Signed)
Physician Discharge Summary  Cesar [name] WJX:[id number] DOB: 15-May-1929 DOA: 03/15/2019  PCP: Orpah Melter, MD  Admit date: 03/15/2019 Discharge date: 03/16/2019  Admitted From: Home Disposition: Home  Recommendations for Outpatient Follow-up:  1. Follow up with PCP in 1-2 weeks   Home Health: None Equipment/Devices: None  Discharge Condition: Stable CODE STATUS: Full code Diet recommendation: Regular diet   Discharge Diagnoses:  Principal Problem:   Acute ischemic stroke [facility]) Active Problems:   Permanent atrial fibrillation (HCC)   Mild aortic stenosis   Thoracic ascending aortic aneurysm (HCC)   CKD (chronic kidney disease), stage III ([place])  85 year old gentleman with past medical history of paroxysmal A. fib on Coumadin, prior stroke with no neurological deficit, history of DVT, stage III CKD, femoral artery thromboembolism presented to the emergency room by EMS with acute onset of left-sided weakness, facial droop abnormal gait and slurred speech that was improved and normalized by the time patient came to the ER.  CT reveals no hemorrhage.  Showed left frontal a small old infarct.MRI showed 40 mm left cerebellar vermis area infarction.  MRA of the head negative for intracranial large vessel occlusion.  Patient remained stable with no recurrence of symptoms throughout hospitalization.  2D echocardiogram normal.  Carotid duplexes done, results are pending.  INR is therapeutic.  Since patient is asymptomatic, he was discharged home with family to continue Coumadin.  LDL was 97, dose of Lipitor was increased to 80 mg.  Had abnormal urinalysis on presentation with no urinary symptoms.  Cultures has been sent.  If any abnormal growth, will call in prescription of antibiotics.  Currently staying off antibiotics.  Discharge Instructions  Discharge Instructions    Call MD for:   Complete by: As directed    Recurrent symptoms   Diet - low sodium heart healthy   Complete by: As  directed    Increase activity slowly   Complete by: As directed      Allergies as of 03/16/2019      Reactions   Flomax [tamsulosin Hcl] Other (See Comments)   Can't pee      Medication List    TAKE these medications   atorvastatin 80 MG tablet Commonly known as: LIPITOR Take 1 tablet (80 mg total) by mouth daily. What changed:   medication strength  how much to take   finasteride 5 MG tablet Commonly known as: PROSCAR Take 5 mg by mouth daily.   WARFARIN SODIUM PO       Allergies  Allergen Reactions  . Flomax [Tamsulosin Hcl] Other (See Comments)    Can't pee    Consultations:  Neurology   Procedures/Studies: Dg Chest 2 View  Result Date: 03/15/2019 CLINICAL DATA:  Acute ischemic stroke EXAM: CHEST - 2 VIEW COMPARISON:  02/05/2010 chest radiograph.  11/23/2018 chest CT. FINDINGS: Stable cardiomediastinal silhouette with mild cardiomegaly. No pneumothorax. No pleural effusion. Lungs appear clear, with no acute consolidative airspace disease and no pulmonary edema. IMPRESSION: Stable mild cardiomegaly without pulmonary edema. No active pulmonary disease. Electronically Signed   By: Ilona Sorrel M.D.   On: 03/15/2019 20:45   Mr Brain Wo Contrast  Result Date: 03/16/2019 CLINICAL DATA:  Initial evaluation for focal neural deficit, stroke. EXAM: MRI HEAD WITHOUT CONTRAST MRA HEAD WITHOUT CONTRAST TECHNIQUE: Multiplanar, multiecho pulse sequences of the brain and surrounding structures were obtained without intravenous contrast. Angiographic images of the head were obtained using MRA technique without contrast. COMPARISON:  Prior CT from 03/15/2019. FINDINGS: MRI HEAD FINDINGS Brain:  Generalized age-related cerebral atrophy. Patchy and confluent T2/FLAIR hyperintensity within the periventricular and deep white matter both cerebral hemispheres most consistent with chronic microvascular ischemic disease, moderate nature. Chronic left MCA territory infarct seen involving the  anterior left frontal lobe. Additional chronic left basal ganglia lacunar infarct. There is a single punctate 4 mm focus of diffusion abnormality involving the left paramedian cerebellar vermis (series 5, image 59). While this finding is suspected to be artifactual in nature, a punctate acute to early subacute ischemic infarct would be difficult to exclude, and could be considered in the correct clinical setting. This is not well seen on corresponding ADC map or corresponding sequences due to small size. No other evidence for acute or subacute ischemia. Gray-white matter differentiation otherwise maintained. No evidence for acute or chronic intracranial hemorrhage. No mass lesion, midline shift or mass effect. No hydrocephalus. No extra-axial fluid collection. Pituitary gland within normal limits. Vascular: Major intracranial vascular flow voids maintained. Skull and upper cervical spine: Craniocervical junction normal. Upper cervical spine within normal limits. Bone marrow signal intensity normal. No scalp soft tissue abnormality. Sinuses/Orbits: Patient status post bilateral ocular lens replacement. Scattered mucosal thickening throughout the paranasal sinuses. No air-fluid level to suggest acute sinusitis. Small left mastoid effusion noted. Inner ear structures normal. Other: None. MRA HEAD FINDINGS ANTERIOR CIRCULATION: Distal cervical segments of the internal carotid arteries are patent with antegrade flow. Distal cervical left ICA tortuous. Petrous segments patent bilaterally. Cavernous segments widely patent. Supraclinoid left ICA widely patent. Mild-to-moderate smooth narrowing of the supraclinoid right ICA of up to approximately 50% (series 7, image 80). ICA termini well perfused. A1 segments irregular but patent. Normal anterior communicating artery. Anterior cerebral arteries patent to their distal aspects without high-grade stenosis. No M1 stenosis or occlusion. Right M1 bifurcates early. Distal MCA  branches well perfused and fairly symmetric. POSTERIOR CIRCULATION: Vertebral arteries patent to the vertebrobasilar junction without stenosis. Left vertebral artery dominant with a diffusely hypoplastic right vertebral artery. Patent left PICA. Right PICA not seen. Basilar mildly tortuous but widely patent to its distal aspect. Superior cerebral arteries patent bilaterally. Both of the posterior cerebral arteries primarily supplied via the basilar. PCAs demonstrate mild atheromatous irregularity but are widely patent to their distal aspects without flow-limiting stenosis. No intracranial aneurysm. IMPRESSION: MRI HEAD IMPRESSION: 1. Punctate 4 mm focus of diffusion abnormality involving the left cerebellar vermis. While this finding is suspected to be artifactual nature, a possible punctate acute to early subacute ischemic infarct would be difficult to exclude, and could be considered in the correct clinical setting. 2. No other acute intracranial abnormality. 3. Chronic left MCA territory infarcts with underlying moderate chronic microvascular ischemic disease. MRA HEAD IMPRESSION: 1. Negative intracranial MRA for large vessel occlusion. 2. Mild-to-moderate smooth stenosis involving the supraclinoid right ICA. 3. Additional mild atherosclerotic change elsewhere within the intracranial circulation. No other hemodynamically significant or correctable stenosis. Electronically Signed   By: Jeannine Boga M.D.   On: 03/16/2019 04:29   Mr Jodene Nam Head Wo Contrast  Result Date: 03/16/2019 CLINICAL DATA:  Initial evaluation for focal neural deficit, stroke. EXAM: MRI HEAD WITHOUT CONTRAST MRA HEAD WITHOUT CONTRAST TECHNIQUE: Multiplanar, multiecho pulse sequences of the brain and surrounding structures were obtained without intravenous contrast. Angiographic images of the head were obtained using MRA technique without contrast. COMPARISON:  Prior CT from 03/15/2019. FINDINGS: MRI HEAD FINDINGS Brain: Generalized  age-related cerebral atrophy. Patchy and confluent T2/FLAIR hyperintensity within the periventricular and deep white matter both cerebral hemispheres most consistent  with chronic microvascular ischemic disease, moderate nature. Chronic left MCA territory infarct seen involving the anterior left frontal lobe. Additional chronic left basal ganglia lacunar infarct. There is a single punctate 4 mm focus of diffusion abnormality involving the left paramedian cerebellar vermis (series 5, image 59). While this finding is suspected to be artifactual in nature, a punctate acute to early subacute ischemic infarct would be difficult to exclude, and could be considered in the correct clinical setting. This is not well seen on corresponding ADC map or corresponding sequences due to small size. No other evidence for acute or subacute ischemia. Gray-white matter differentiation otherwise maintained. No evidence for acute or chronic intracranial hemorrhage. No mass lesion, midline shift or mass effect. No hydrocephalus. No extra-axial fluid collection. Pituitary gland within normal limits. Vascular: Major intracranial vascular flow voids maintained. Skull and upper cervical spine: Craniocervical junction normal. Upper cervical spine within normal limits. Bone marrow signal intensity normal. No scalp soft tissue abnormality. Sinuses/Orbits: Patient status post bilateral ocular lens replacement. Scattered mucosal thickening throughout the paranasal sinuses. No air-fluid level to suggest acute sinusitis. Small left mastoid effusion noted. Inner ear structures normal. Other: None. MRA HEAD FINDINGS ANTERIOR CIRCULATION: Distal cervical segments of the internal carotid arteries are patent with antegrade flow. Distal cervical left ICA tortuous. Petrous segments patent bilaterally. Cavernous segments widely patent. Supraclinoid left ICA widely patent. Mild-to-moderate smooth narrowing of the supraclinoid right ICA of up to approximately  50% (series 7, image 80). ICA termini well perfused. A1 segments irregular but patent. Normal anterior communicating artery. Anterior cerebral arteries patent to their distal aspects without high-grade stenosis. No M1 stenosis or occlusion. Right M1 bifurcates early. Distal MCA branches well perfused and fairly symmetric. POSTERIOR CIRCULATION: Vertebral arteries patent to the vertebrobasilar junction without stenosis. Left vertebral artery dominant with a diffusely hypoplastic right vertebral artery. Patent left PICA. Right PICA not seen. Basilar mildly tortuous but widely patent to its distal aspect. Superior cerebral arteries patent bilaterally. Both of the posterior cerebral arteries primarily supplied via the basilar. PCAs demonstrate mild atheromatous irregularity but are widely patent to their distal aspects without flow-limiting stenosis. No intracranial aneurysm. IMPRESSION: MRI HEAD IMPRESSION: 1. Punctate 4 mm focus of diffusion abnormality involving the left cerebellar vermis. While this finding is suspected to be artifactual nature, a possible punctate acute to early subacute ischemic infarct would be difficult to exclude, and could be considered in the correct clinical setting. 2. No other acute intracranial abnormality. 3. Chronic left MCA territory infarcts with underlying moderate chronic microvascular ischemic disease. MRA HEAD IMPRESSION: 1. Negative intracranial MRA for large vessel occlusion. 2. Mild-to-moderate smooth stenosis involving the supraclinoid right ICA. 3. Additional mild atherosclerotic change elsewhere within the intracranial circulation. No other hemodynamically significant or correctable stenosis. Electronically Signed   By: Jeannine Boga M.D.   On: 03/16/2019 04:29   Ct Head Code Stroke Wo Contrast  Result Date: 03/15/2019 CLINICAL DATA:  Code stroke. Left arm drift and slurred speech. Last seen well 18 40 EXAM: CT HEAD WITHOUT CONTRAST TECHNIQUE: Contiguous axial  images were obtained from the base of the skull through the vertex without intravenous contrast. COMPARISON:  01/01/2012 FINDINGS: Brain: There is no mass, hemorrhage or extra-axial collection. The size and configuration of the ventricles and extra-axial CSF spaces are normal. There is an old left frontal lobe infarct and a left caudate head old lacunar infarct. There is periventricular hypoattenuation compatible with chronic microvascular disease. Vascular: Atherosclerotic calcification of the internal carotid arteries at the  skull base. No abnormal hyperdensity of the major intracranial arteries or dural venous sinuses. Skull: The visualized skull base, calvarium and extracranial soft tissues are normal. Sinuses/Orbits: No fluid levels or advanced mucosal thickening of the visualized paranasal sinuses. No mastoid or middle ear effusion. The orbits are normal. ASPECTS [facility] Stroke Program Early CT Score) - Ganglionic level infarction (caudate, lentiform nuclei, internal capsule, insula, M1-M3 cortex): 7 - Supraganglionic infarction (M4-M6 cortex): 3 Total score (0-10 with 10 being normal): 10 IMPRESSION: 1. No acute hemorrhage. 2. ASPECTS is 10. 3. Old left MCA territory infarcts. These results were communicated to Dr. Kerney Elbe at 7:55 pm on 03/15/2019 by text page via the [facility] messaging system. Electronically Signed   By: Ulyses Jarred M.D.   On: 03/15/2019 19:56   Vas US Carotid (at [place] Only)  Result Date: 03/16/2019 Carotid Arterial Duplex Study Indications:       CVA. Risk Factors:      Hyperlipidemia, prior CVA. Comparison Study:  No prior study Performing Technologist: Maudry Mayhew MHA, RDMS, RVT, RDCS  Examination Guidelines: A complete evaluation includes B-mode imaging, spectral Doppler, color Doppler, and power Doppler as needed of all accessible portions of each vessel. Bilateral testing is considered an integral part of a complete examination. Limited examinations for reoccurring  indications may be performed as noted.  Right Carotid Findings: +----------+--------+--------+--------+---------------------+------------------+           PSV cm/sEDV cm/sStenosisDescribe             Comments           +----------+--------+--------+--------+---------------------+------------------+ CCA Prox  49      9                                    intimal thickening +----------+--------+--------+--------+---------------------+------------------+ CCA Distal49      12              smooth and                                                                heterogenous                            +----------+--------+--------+--------+---------------------+------------------+ ICA Prox  59      11              smooth, heterogenous                                                      and calcific                            +----------+--------+--------+--------+---------------------+------------------+ ICA Distal68      18                                                      +----------+--------+--------+--------+---------------------+------------------+  ECA       93      8               smooth and                                                                heterogenous                            +----------+--------+--------+--------+---------------------+------------------+ +----------+--------+-------+----------------+-------------------+           PSV cm/sEDV cmsDescribe        Arm Pressure (mmHG) +----------+--------+-------+----------------+-------------------+ [id number]             Multiphasic, WNL                    +----------+--------+-------+----------------+-------------------+ +---------+--------+--+--------+-+---------+ VertebralPSV cm/s31EDV cm/s7Antegrade +---------+--------+--+--------+-+---------+  Left Carotid Findings: +----------+-------+-------+--------+---------------------------------+--------+            PSV    EDV    StenosisDescribe                         Comments           cm/s   cm/s                                                     +----------+-------+-------+--------+---------------------------------+--------+ CCA Prox  85     14                                                       +----------+-------+-------+--------+---------------------------------+--------+ CCA Distal49     9              heterogenous, smooth and calcific         +----------+-------+-------+--------+---------------------------------+--------+ ICA Prox  36     11             heterogenous, irregular and                                               calcific                                  +----------+-------+-------+--------+---------------------------------+--------+ ICA Distal71     20                                                       +----------+-------+-------+--------+---------------------------------+--------+ ECA       97     13             heterogenous and irregular                +----------+-------+-------+--------+---------------------------------+--------+ +----------+--------+--------+----------------+-------------------+  SubclavianPSV cm/sEDV cm/sDescribe        Arm Pressure (mmHG) +----------+--------+--------+----------------+-------------------+           83              Multiphasic, WNL                    +----------+--------+--------+----------------+-------------------+ +---------+--------+--+--------+--+---------+ VertebralPSV cm/s47EDV cm/s16Antegrade +---------+--------+--+--------+--+---------+  Summary: Right Carotid: Velocities in the right ICA are consistent with a 1-39% stenosis. Left Carotid: Velocities in the left ICA are consistent with a 1-39% stenosis. Vertebrals:  Bilateral vertebral arteries demonstrate antegrade flow. Subclavians: Normal flow hemodynamics were seen in bilateral subclavian              arteries. *See table(s)  above for measurements and observations.     Preliminary        Subjective: Patient was seen and examined.  Examined in the evening for discharge readiness.  Son at the bedside.  Patient is up about an wanting to go home.  Denies any complaints.  Work with PT OT and had no new deficits.   Discharge Exam: Vitals:   03/16/19 0821 03/16/19 1113  BP: 130/75 129/61  Pulse: 62 (!) 57  Resp: 17 16  Temp: 98 F (36.7 C) 98.1 F (36.7 C)  SpO2: 98% 99%   Vitals:   03/16/19 0401 03/16/19 0601 03/16/19 0821 03/16/19 1113  BP: (!) 142/75 (!) 145/72 130/75 129/61  Pulse: 80 79 62 (!) 57  Resp: 18 18 17 16   Temp: 98.1 F (36.7 C) 98.2 F (36.8 C) 98 F (36.7 C) 98.1 F (36.7 C)  TempSrc: Oral Oral Oral Oral  SpO2: 99% 98% 98% 99%  Weight:      Height:        General: Pt is alert, awake, not in acute distress Cardiovascular: RRR, S1/S2 +, no rubs, no gallops Respiratory: CTA bilaterally, no wheezing, no rhonchi Abdominal: Soft, NT, ND, bowel sounds + Extremities: no edema, no cyanosis No neurological deficits.   The results of significant diagnostics from this hospitalization (including imaging, microbiology, ancillary and laboratory) are listed below for reference.     Microbiology: Recent Results (from the past 240 hour(s))  SARS Coronavirus 2     Status: None   Collection Time: 03/15/19 10:17 PM  Result Value Ref Range Status   SARS Coronavirus 2 NOT DETECTED NOT DETECTED Final    Comment: (NOTE) SARS-CoV-2 target nucleic acids are NOT DETECTED. The SARS-CoV-2 RNA is generally detectable in upper and lower respiratory specimens during the acute phase of infection.  Negative  results do not preclude SARS-CoV-2 infection, do not rule out co-infections with other pathogens, and should not be used as the sole basis for treatment or other patient management decisions.  Negative results must be combined with clinical observations, patient history, and epidemiological  information. The expected result is Not Detected. Fact Sheet for Patients: http://www.biofiredefense.com/wp-content/uploads/2020/03/BIOFIRE-COVID -19-patients.pdf Fact Sheet for Healthcare Providers: http://www.biofiredefense.com/wp-content/uploads/2020/03/BIOFIRE-COVID -19-hcp.pdf This test is not yet approved or cleared by the [place] and  has been authorized for detection and/or diagnosis of SARS-CoV-2 by FDA under an Emergency Use Authorization (EUA).  This EUA will remain in effec t (meaning this test can be used) for the duration of  the COVID-19 declaration under Section 564(b)(1) of the Act, 21 U.S.C. section 360bbb-3(b)(1), unless the authorization is terminated or revoked sooner. Performed at [facility], [place] [address], [place], [place] [address]      Labs: BNP (last 3  results) No results for input(s): BNP in the last 8760 hours. Basic Metabolic Panel: Recent Labs  Lab 03/15/19 1940 03/15/19 2003  NA 139 139  K 4.6 4.9  CL 107 107  CO2 24  --   GLUCOSE 106* 100*  BUN 24* 27*  CREATININE 1.58* 1.70*  CALCIUM 8.8*  --    Liver Function Tests: Recent Labs  Lab 03/15/19 1940  AST 18  ALT 15  ALKPHOS 50  BILITOT 0.8  PROT 6.6  ALBUMIN 3.4*   No results for input(s): LIPASE, AMYLASE in the last 168 hours. No results for input(s): AMMONIA in the last 168 hours. CBC: Recent Labs  Lab 03/15/19 1940 03/15/19 2003  WBC 6.0  --   NEUTROABS 3.7  --   HGB 13.5 14.3  HCT 41.8 42.0  MCV 96.3  --   PLT 155  --    Cardiac Enzymes: No results for input(s): CKTOTAL, CKMB, CKMBINDEX, TROPONINI in the last 168 hours. BNP: Invalid input(s): POCBNP CBG: No results for input(s): GLUCAP in the last 168 hours. D-Dimer No results for input(s): DDIMER in the last 72 hours. Hgb A1c Recent Labs    03/16/19 0446  HGBA1C 5.3   Lipid Profile Recent Labs    03/16/19 0446  CHOL 154  HDL 38*  LDLCALC 94  TRIG 108  CHOLHDL 4.1   Thyroid  function studies No results for input(s): TSH, T4TOTAL, T3FREE, THYROIDAB in the last 72 hours.  Invalid input(s): FREET3 Anemia work up No results for input(s): VITAMINB12, FOLATE, FERRITIN, TIBC, IRON, RETICCTPCT in the last 72 hours. Urinalysis    Component Value Date/Time   COLORURINE YELLOW 03/16/2019 [place] 03/16/2019 0452   LABSPEC 1.015 03/16/2019 0452   PHURINE 6.0 03/16/2019 0452   GLUCOSEU NEGATIVE 03/16/2019 0452   HGBUR SMALL (A) 03/16/2019 0452   BILIRUBINUR NEGATIVE 03/16/2019 0452   KETONESUR NEGATIVE 03/16/2019 0452   PROTEINUR 30 (A) 03/16/2019 0452   UROBILINOGEN 2.0 (H) 01/01/2012 1328   NITRITE NEGATIVE 03/16/2019 0452   LEUKOCYTESUR MODERATE (A) 03/16/2019 0452   Sepsis Labs Invalid input(s): PROCALCITONIN,  WBC,  LACTICIDVEN Microbiology Recent Results (from the past 240 hour(s))  SARS Coronavirus 2     Status: None   Collection Time: 03/15/19 10:17 PM  Result Value Ref Range Status   SARS Coronavirus 2 NOT DETECTED NOT DETECTED Final    Comment: (NOTE) SARS-CoV-2 target nucleic acids are NOT DETECTED. The SARS-CoV-2 RNA is generally detectable in upper and lower respiratory specimens during the acute phase of infection.  Negative  results do not preclude SARS-CoV-2 infection, do not rule out co-infections with other pathogens, and should not be used as the sole basis for treatment or other patient management decisions.  Negative results must be combined with clinical observations, patient history, and epidemiological information. The expected result is Not Detected. Fact Sheet for Patients: http://www.biofiredefense.com/wp-content/uploads/2020/03/BIOFIRE-COVID -19-patients.pdf Fact Sheet for Healthcare Providers: http://www.biofiredefense.com/wp-content/uploads/2020/03/BIOFIRE-COVID -19-hcp.pdf This test is not yet approved or cleared by the [place] and  has been authorized for detection and/or diagnosis of SARS-CoV-2  by FDA under an Emergency Use Authorization (EUA).  This EUA will remain in effec t (meaning this test can be used) for the duration of  the COVID-19 declaration under Section 564(b)(1) of the Act, 21 U.S.C. section 360bbb-3(b)(1), unless the authorization is terminated or revoked sooner. Performed at [facility], [place] [address], [place], [place] [address]      Time coordinating discharge: 25 minutes  SIGNED:   Barb Merino, MD  [facility] 03/16/2019, 2:56 PM Pager [phone]  If 7PM-7AM, please contact night-coverage [email or web address] Password [email or web address]

## 2019-03-16 NOTE — Progress Notes (Signed)
STROKE TEAM PROGRESS NOTE   INTERVAL HISTORY I have personally reviewed patient's history of presenting illness and details.  He presented with transient left-sided weakness and numbness which has resolved.  MRI scan of the brain shows no acute right brain stroke but does show tiny punctate midline cerebellar infarct which is likely incidental.  He has chronic A. fib and takes warfarin and INR was optimal.  Vitals:   03/16/19 0401 03/16/19 0601 03/16/19 0821 03/16/19 1113  BP: (!) 142/75 (!) 145/72 130/75 129/61  Pulse: 80 79 62 (!) 57  Resp: 18 18 17 16   Temp: 98.1 F (36.7 C) 98.2 F (36.8 C) 98 F (36.7 C) 98.1 F (36.7 C)  TempSrc: Oral Oral Oral Oral  SpO2: 99% 98% 98% 99%  Weight:      Height:        CBC:  Recent Labs  Lab 03/15/19 1940 03/15/19 2003  WBC 6.0  --   NEUTROABS 3.7  --   HGB 13.5 14.3  HCT 41.8 42.0  MCV 96.3  --   PLT 155  --     Basic Metabolic Panel:  Recent Labs  Lab 03/15/19 1940 03/15/19 2003  NA 139 139  K 4.6 4.9  CL 107 107  CO2 24  --   GLUCOSE 106* 100*  BUN 24* 27*  CREATININE 1.58* 1.70*  CALCIUM 8.8*  --    Lipid Panel:     Component Value Date/Time   CHOL 154 03/16/2019 0446   TRIG 108 03/16/2019 0446   HDL 38 (L) 03/16/2019 0446   CHOLHDL 4.1 03/16/2019 0446   VLDL 22 03/16/2019 0446   LDLCALC 94 03/16/2019 0446   HgbA1c:  Lab Results  Component Value Date   HGBA1C 5.3 03/16/2019   Urine Drug Screen:     Component Value Date/Time   LABOPIA NONE DETECTED 03/16/2019 0451   COCAINSCRNUR NONE DETECTED 03/16/2019 0451   LABBENZ NONE DETECTED 03/16/2019 0451   AMPHETMU NONE DETECTED 03/16/2019 0451   THCU NONE DETECTED 03/16/2019 0451   LABBARB NONE DETECTED 03/16/2019 0451    Alcohol Level     Component Value Date/Time   ETH <10 03/15/2019 1940    IMAGING Dg Chest 2 View  Result Date: 03/15/2019 CLINICAL DATA:  Acute ischemic stroke EXAM: CHEST - 2 VIEW COMPARISON:  02/05/2010 chest radiograph.   11/23/2018 chest CT. FINDINGS: Stable cardiomediastinal silhouette with mild cardiomegaly. No pneumothorax. No pleural effusion. Lungs appear clear, with no acute consolidative airspace disease and no pulmonary edema. IMPRESSION: Stable mild cardiomegaly without pulmonary edema. No active pulmonary disease. Electronically Signed   By: Ilona Sorrel M.D.   On: 03/15/2019 20:45   Mr Brain Wo Contrast  Result Date: 03/16/2019 CLINICAL DATA:  Initial evaluation for focal neural deficit, stroke. EXAM: MRI HEAD WITHOUT CONTRAST MRA HEAD WITHOUT CONTRAST TECHNIQUE: Multiplanar, multiecho pulse sequences of the brain and surrounding structures were obtained without intravenous contrast. Angiographic images of the head were obtained using MRA technique without contrast. COMPARISON:  Prior CT from 03/15/2019. FINDINGS: MRI HEAD FINDINGS Brain: Generalized age-related cerebral atrophy. Patchy and confluent T2/FLAIR hyperintensity within the periventricular and deep white matter both cerebral hemispheres most consistent with chronic microvascular ischemic disease, moderate nature. Chronic left MCA territory infarct seen involving the anterior left frontal lobe. Additional chronic left basal ganglia lacunar infarct. There is a single punctate 4 mm focus of diffusion abnormality involving the left paramedian cerebellar vermis (series 5, image 59). While this finding is suspected to be  artifactual in nature, a punctate acute to early subacute ischemic infarct would be difficult to exclude, and could be considered in the correct clinical setting. This is not well seen on corresponding ADC map or corresponding sequences due to small size. No other evidence for acute or subacute ischemia. Gray-white matter differentiation otherwise maintained. No evidence for acute or chronic intracranial hemorrhage. No mass lesion, midline shift or mass effect. No hydrocephalus. No extra-axial fluid collection. Pituitary gland within normal  limits. Vascular: Major intracranial vascular flow voids maintained. Skull and upper cervical spine: Craniocervical junction normal. Upper cervical spine within normal limits. Bone marrow signal intensity normal. No scalp soft tissue abnormality. Sinuses/Orbits: Patient status post bilateral ocular lens replacement. Scattered mucosal thickening throughout the paranasal sinuses. No air-fluid level to suggest acute sinusitis. Small left mastoid effusion noted. Inner ear structures normal. Other: None. MRA HEAD FINDINGS ANTERIOR CIRCULATION: Distal cervical segments of the internal carotid arteries are patent with antegrade flow. Distal cervical left ICA tortuous. Petrous segments patent bilaterally. Cavernous segments widely patent. Supraclinoid left ICA widely patent. Mild-to-moderate smooth narrowing of the supraclinoid right ICA of up to approximately 50% (series 7, image 80). ICA termini well perfused. A1 segments irregular but patent. Normal anterior communicating artery. Anterior cerebral arteries patent to their distal aspects without high-grade stenosis. No M1 stenosis or occlusion. Right M1 bifurcates early. Distal MCA branches well perfused and fairly symmetric. POSTERIOR CIRCULATION: Vertebral arteries patent to the vertebrobasilar junction without stenosis. Left vertebral artery dominant with a diffusely hypoplastic right vertebral artery. Patent left PICA. Right PICA not seen. Basilar mildly tortuous but widely patent to its distal aspect. Superior cerebral arteries patent bilaterally. Both of the posterior cerebral arteries primarily supplied via the basilar. PCAs demonstrate mild atheromatous irregularity but are widely patent to their distal aspects without flow-limiting stenosis. No intracranial aneurysm. IMPRESSION: MRI HEAD IMPRESSION: 1. Punctate 4 mm focus of diffusion abnormality involving the left cerebellar vermis. While this finding is suspected to be artifactual nature, a possible punctate  acute to early subacute ischemic infarct would be difficult to exclude, and could be considered in the correct clinical setting. 2. No other acute intracranial abnormality. 3. Chronic left MCA territory infarcts with underlying moderate chronic microvascular ischemic disease. MRA HEAD IMPRESSION: 1. Negative intracranial MRA for large vessel occlusion. 2. Mild-to-moderate smooth stenosis involving the supraclinoid right ICA. 3. Additional mild atherosclerotic change elsewhere within the intracranial circulation. No other hemodynamically significant or correctable stenosis. Electronically Signed   By: Jeannine Boga M.D.   On: 03/16/2019 04:29   Mr Jodene Nam Head Wo Contrast  Result Date: 03/16/2019 CLINICAL DATA:  Initial evaluation for focal neural deficit, stroke. EXAM: MRI HEAD WITHOUT CONTRAST MRA HEAD WITHOUT CONTRAST TECHNIQUE: Multiplanar, multiecho pulse sequences of the brain and surrounding structures were obtained without intravenous contrast. Angiographic images of the head were obtained using MRA technique without contrast. COMPARISON:  Prior CT from 03/15/2019. FINDINGS: MRI HEAD FINDINGS Brain: Generalized age-related cerebral atrophy. Patchy and confluent T2/FLAIR hyperintensity within the periventricular and deep white matter both cerebral hemispheres most consistent with chronic microvascular ischemic disease, moderate nature. Chronic left MCA territory infarct seen involving the anterior left frontal lobe. Additional chronic left basal ganglia lacunar infarct. There is a single punctate 4 mm focus of diffusion abnormality involving the left paramedian cerebellar vermis (series 5, image 59). While this finding is suspected to be artifactual in nature, a punctate acute to early subacute ischemic infarct would be difficult to exclude, and could be considered in the  correct clinical setting. This is not well seen on corresponding ADC map or corresponding sequences due to small size. No other  evidence for acute or subacute ischemia. Gray-white matter differentiation otherwise maintained. No evidence for acute or chronic intracranial hemorrhage. No mass lesion, midline shift or mass effect. No hydrocephalus. No extra-axial fluid collection. Pituitary gland within normal limits. Vascular: Major intracranial vascular flow voids maintained. Skull and upper cervical spine: Craniocervical junction normal. Upper cervical spine within normal limits. Bone marrow signal intensity normal. No scalp soft tissue abnormality. Sinuses/Orbits: Patient status post bilateral ocular lens replacement. Scattered mucosal thickening throughout the paranasal sinuses. No air-fluid level to suggest acute sinusitis. Small left mastoid effusion noted. Inner ear structures normal. Other: None. MRA HEAD FINDINGS ANTERIOR CIRCULATION: Distal cervical segments of the internal carotid arteries are patent with antegrade flow. Distal cervical left ICA tortuous. Petrous segments patent bilaterally. Cavernous segments widely patent. Supraclinoid left ICA widely patent. Mild-to-moderate smooth narrowing of the supraclinoid right ICA of up to approximately 50% (series 7, image 80). ICA termini well perfused. A1 segments irregular but patent. Normal anterior communicating artery. Anterior cerebral arteries patent to their distal aspects without high-grade stenosis. No M1 stenosis or occlusion. Right M1 bifurcates early. Distal MCA branches well perfused and fairly symmetric. POSTERIOR CIRCULATION: Vertebral arteries patent to the vertebrobasilar junction without stenosis. Left vertebral artery dominant with a diffusely hypoplastic right vertebral artery. Patent left PICA. Right PICA not seen. Basilar mildly tortuous but widely patent to its distal aspect. Superior cerebral arteries patent bilaterally. Both of the posterior cerebral arteries primarily supplied via the basilar. PCAs demonstrate mild atheromatous irregularity but are widely patent  to their distal aspects without flow-limiting stenosis. No intracranial aneurysm. IMPRESSION: MRI HEAD IMPRESSION: 1. Punctate 4 mm focus of diffusion abnormality involving the left cerebellar vermis. While this finding is suspected to be artifactual nature, a possible punctate acute to early subacute ischemic infarct would be difficult to exclude, and could be considered in the correct clinical setting. 2. No other acute intracranial abnormality. 3. Chronic left MCA territory infarcts with underlying moderate chronic microvascular ischemic disease. MRA HEAD IMPRESSION: 1. Negative intracranial MRA for large vessel occlusion. 2. Mild-to-moderate smooth stenosis involving the supraclinoid right ICA. 3. Additional mild atherosclerotic change elsewhere within the intracranial circulation. No other hemodynamically significant or correctable stenosis. Electronically Signed   By: Jeannine Boga M.D.   On: 03/16/2019 04:29   Ct Head Code Stroke Wo Contrast  Result Date: 03/15/2019 CLINICAL DATA:  Code stroke. Left arm drift and slurred speech. Last seen well 18 40 EXAM: CT HEAD WITHOUT CONTRAST TECHNIQUE: Contiguous axial images were obtained from the base of the skull through the vertex without intravenous contrast. COMPARISON:  01/01/2012 FINDINGS: Brain: There is no mass, hemorrhage or extra-axial collection. The size and configuration of the ventricles and extra-axial CSF spaces are normal. There is an old left frontal lobe infarct and a left caudate head old lacunar infarct. There is periventricular hypoattenuation compatible with chronic microvascular disease. Vascular: Atherosclerotic calcification of the internal carotid arteries at the skull base. No abnormal hyperdensity of the major intracranial arteries or dural venous sinuses. Skull: The visualized skull base, calvarium and extracranial soft tissues are normal. Sinuses/Orbits: No fluid levels or advanced mucosal thickening of the visualized paranasal  sinuses. No mastoid or middle ear effusion. The orbits are normal. ASPECTS [facility] Stroke Program Early CT Score) - Ganglionic level infarction (caudate, lentiform nuclei, internal capsule, insula, M1-M3 cortex): 7 - Supraganglionic infarction (M4-M6 cortex): 3  Total score (0-10 with 10 being normal): 10 IMPRESSION: 1. No acute hemorrhage. 2. ASPECTS is 10. 3. Old left MCA territory infarcts. These results were communicated to Dr. Kerney Elbe at 7:55 pm on 03/15/2019 by text page via the [facility] messaging system. Electronically Signed   By: Ulyses Jarred M.D.   On: 03/15/2019 19:56    PHYSICAL EXAM Pleasant elderly Caucasian male currently not in distress.  He is extremely hard of hearing. . Afebrile. Head is nontraumatic. Neck is supple without bruit.    Cardiac exam no murmur or gallop. Lungs are clear to auscultation. Distal pulses are well felt. Neurological Exam ;  Awake  Alert oriented x 3. Normal speech and language.eye movements full without nystagmus.fundi were not visualized. Vision acuity and fields appear normal. Hearing is diminished bilaterally l. Palatal movements are normal. Face symmetric. Tongue midline. Normal strength, tone, reflexes and coordination. Normal sensation. Gait deferred.  ASSESSMENT/PLAN Cesar [name] is a 85 y.o. male with history of AF on warfarin, HLD, AAA, Aortic stenosis, hx DVT, OSA presenting with L sided weakness, L field cut.  R brainTIA Incidental punctate cerebellar vermis infarct d/t small vessel disease.   Code Stroke CT head No acute stroke. Old L MCA infarct. ASPECTS 10.     MRI  Punctate L cerebellar vermis infarct. Old L MCA infarcts. Small vessel disease.   MRA  No LVO. Mild to mod smooth stenosis R supraclinoid ICA.  Carotid Doppler  B ICA 1-39% stenosis, VAs antegrade   2D Echo EF 60-65%. LA severely dilated. No source of embolus   LDL 94  HgbA1c 5.3  Warfarin for VTE prophylaxis  warfarin daily prior to admission, now on  warfarin daily. Consider Eliquis if pt can afford.  Therapy recommendations:  HH OT recommended by therapy at d/c, but no new defcitis  Disposition:  Returned home w/ family  Atrial Fibrillation  Home anticoagulation:  warfarin daily   INR 2.1 on admission . Continue warfarin daily at discharge   Hypertension  Stable . BP goal normotensive  Hyperlipidemia  Home meds:  lipitor 40  Now on lipitor 80  LDL 91, goal < 70  Continue statin at discharge  Other Stroke Risk Factors  Advanced age  Former Cigarette smoker, quit 9 yrs ago  ETOH use, advised to drink no more than 2 drink(s) a day  Family hx stroke (father)  Obstructive sleep apnea, e  AAA  Aortic stenosis  DVT  Other Active Problems  CKD stage III  Hospital day # 0 I have personally obtained history,examined this patient, reviewed notes, independently viewed imaging studies, participated in medical decision making and plan of care.ROS completed by me personally and pertinent positives fully documented  I have made any additions or clarifications directly to the above note.  He presented with right hemispheric TIA secondary to cardioembolic him from A. fib despite optimal anticoagulation with warfarin.  I discussed alternatives like switching to Eliquis, Pradaxa or Xarelto.  This will likely result in a higher co-pay and patient and family cannot be able to afford it.  I also discussed that adding aspirin would increase the bleeding risk without proven benefit.  Recommend continue present dose of warfarin and maintain aggressive risk factor modification.  Follow-up as an outpatient in the stroke clinic in 6 weeks.  Discussed with Dr. Dante Gang. I have spent a total of  25 minutes with the patient reviewing hospital notes,  test results, labs and examining the patient as well as establishing  an assessment and plan that was discussed personally with the patient.  > 50% of time was spent in direct patient care.        Antony Contras, MD Medical Director [facility] Pager: [phone] 03/16/2019 4:40 PM   To contact Stroke Continuity provider, please refer to [email or web address] After hours, contact General Neurology

## 2019-03-16 NOTE — Progress Notes (Signed)
D/C instructions provided to patient, denies questions/concerns at this time. Patient transported to front entrance via WC, tol well. 

## 2019-03-16 NOTE — Evaluation (Signed)
Speech Language Pathology Evaluation Patient Details Name: Cesar [name] MRN: [id number] DOB: 04-28-1929 Today's Date: 03/16/2019 Time: 1856-3149 SLP Time Calculation (min) (ACUTE ONLY): 17 min  Problem List:  Patient Active Problem List   Diagnosis Date Noted  . Acute ischemic stroke ([place]) 03/15/2019  . PAD (peripheral artery disease) ([place]) 07/23/2018  . Ischemia of lower extremity 07/12/2016  . Acute blood loss anemia 07/12/2016  . Depression 07/12/2016  . BPH (benign prostatic hyperplasia) 07/12/2016  . AAA (abdominal aortic aneurysm) ([place])   . Atrial fibrillation ([place])   . Embolism ([place]) 07/02/2016  . CKD (chronic kidney disease), stage III ([place])   . Claudication ([place]) 06/04/2016  . Long term current use of anticoagulant 06/04/2016  . Abnormal ECG 06/04/2016  . Thoracic ascending aortic aneurysm ([place]) 04/22/2015  . Permanent atrial fibrillation ([place]) 05/11/2013  . Mild aortic stenosis 05/11/2013  . Orthostatic hypotension 05/11/2013  . Hypercholesterolemia 05/11/2013   Past Medical History:  Past Medical History:  Diagnosis Date  . AAA (abdominal aortic aneurysm) ([place])    Possibly an erroneous entry: only imaging i see is 2017 and specifically shows "no aneurysmal dilation" of abdominal aorta  . Aortic stenosis    a. mild-mod by echo 06/2016.  Marland Kitchen Atrial fibrillation ([place])   . CKD (chronic kidney disease), stage III ([facility])    baseline Cr 1.4-1.5 in 2016-2017 by PCP)  . CVA (cerebral infarction)   . Depression   . DVT (deep venous thrombosis) ([place]) 2006  . Dyslipidemia   . Hematuria   . HOH (hard of hearing)   . Hypercholesterolemia   . OSA (obstructive sleep apnea)    intol to CPAP  . Permanent atrial fibrillation   . Prostate enlargement   . Pulmonary nodules   . Thoracic ascending aortic aneurysm ([place])    4.5cm in 11/2018, stable since 2016   Past Surgical History:  Past Surgical History:  Procedure Laterality Date  . EMBOLECTOMY Left 07/02/2016   Procedure:  EMBOLECTOMY LEFT FEMORAL ARTERY;  Surgeon: [name], MD;  Location: [place];  Service: Vascular;  Laterality: Left;  . NM MYOCAR PERF WALL MOTION  08/24/2002   markedly positive,subtle lateral ischemia  . PROSTATE SURGERY    . US ECHOCARDIOGRAPHY  01/06/2012   mod. AOV ca+,mild to mod. AS,mod mostly posterior MAC-mild MR   HPI:  Pt is an 85 y.o. male with medical history significant of A.Fib on coumadin, prior stroke without major residual deficit, prior thromboembolism to femoral artery requiring embolectomy, DVT, CKD stage 3, TAAA. Pt presented to ED via EMS with acute onset of L sided weakness, facial droop, abnormal gait, slurred speech. MRI of the brain revealed punctate 4 mm focus of diffusion abnormality involving the left cerebellar vermis; suspected to be artifactual, possible punctate acute to early subacute ischemic infarct would be difficult to exclude.   Assessment / Plan / Recommendation Clinical Impression  Pt participated in speech/language evaluation with his son present. Pt's son reported that the pt was living independently prior to admission with the pt's wife and that he currently has 24-hour supervision. Both parties denied any baseline deficits in speech, language or cognition. Pt's son expressed that the pt did have dysarthria upon admission but that this has since resolved and his speech is now back to baseline with intermittent voice changes during speech. Pt presented with a severe hearing loss and frequently required repetition throughout the evaluation from the pt's son whose voice was deeper. His speech and language skills are currently  within normal limits and no overt cognitive deficits were noted. A reduced ability to adequately coordinate respiration with speech was demonstrated with periods of reduced voicing at the ends of longer utteraces. However, pt and his son indicated that this is the pt's typical speech pattern. Further skilled SLP services are not clinically  indicated at this time. Pt, nursing, and his son were educated regarding results and recommendations; all parties verbalized understanding as well as agreement with plan of care.    SLP Assessment  SLP Recommendation/Assessment: Patient does not need any further Speech Lanaguage Pathology Services SLP Visit Diagnosis: Dysarthria and anarthria (R47.1)    Follow Up Recommendations  None    Frequency and Duration           SLP Evaluation Cognition  Overall Cognitive Status: Within Functional Limits for tasks assessed Arousal/Alertness: Awake/alert Orientation Level: Oriented X4 Attention: Focused;Sustained Focused Attention: Appears intact Sustained Attention: Appears intact Memory: Appears intact(Immediate: 3/3; Delayed: 2/3; with cue: 1/1) Awareness: Appears intact Problem Solving: Appears intact(5/5) Comments: Pt's hearing is severely impaired and unaided due to lack of batteries at hospital       Comprehension  Auditory Comprehension Overall Auditory Comprehension: Appears within functional limits for tasks assessed Yes/No Questions: Within Functional Limits Commands: Within Functional Limits Conversation: Complex Interfering Components: Chief Financial Officer Discrimination: Within Function Limits Reading Comprehension Reading Status: Not tested    Expression Expression Primary Mode of Expression: Verbal Verbal Expression Overall Verbal Expression: Appears within functional limits for tasks assessed Initiation: No impairment Automatic Speech: Counting;Day of week;Month of year(WFL) Level of Generative/Spontaneous Verbalization: Conversation Naming: No impairment Pragmatics: No impairment Written Expression Dominant Hand: Left   Oral / Motor  Oral Motor/Sensory Function Overall Oral Motor/Sensory Function: Within functional limits Motor Speech Overall Motor Speech: Appears within functional limits for tasks assessed Respiration: Within  functional limits Phonation: Normal Resonance: Within functional limits Articulation: Within functional limitis Intelligibility: Intelligible Motor Planning: Witnin functional limits Motor Speech Errors: Not applicable   Derric Dealmeida I. Hardin Negus, [place], [place] Office number [phone] Pager [phone]                   Horton Marshall 03/16/2019, 12:30 PM

## 2019-03-17 LAB — NOVEL CORONAVIRUS, NAA (HOSP ORDER, SEND-OUT TO REF LAB; TAT 18-24 HRS): SARS-CoV-2, NAA: NOT DETECTED

## 2019-03-21 DIAGNOSIS — Z86718 Personal history of other venous thrombosis and embolism: Secondary | ICD-10-CM | POA: Diagnosis not present

## 2019-03-21 DIAGNOSIS — I350 Nonrheumatic aortic (valve) stenosis: Secondary | ICD-10-CM | POA: Diagnosis not present

## 2019-03-21 DIAGNOSIS — Z87891 Personal history of nicotine dependence: Secondary | ICD-10-CM | POA: Diagnosis not present

## 2019-03-21 DIAGNOSIS — I4821 Permanent atrial fibrillation: Secondary | ICD-10-CM | POA: Diagnosis not present

## 2019-03-21 DIAGNOSIS — I4891 Unspecified atrial fibrillation: Secondary | ICD-10-CM | POA: Diagnosis not present

## 2019-03-21 DIAGNOSIS — I716 Thoracoabdominal aortic aneurysm, without rupture: Secondary | ICD-10-CM | POA: Diagnosis not present

## 2019-03-21 DIAGNOSIS — Z8673 Personal history of transient ischemic attack (TIA), and cerebral infarction without residual deficits: Secondary | ICD-10-CM | POA: Diagnosis not present

## 2019-03-21 DIAGNOSIS — I639 Cerebral infarction, unspecified: Secondary | ICD-10-CM | POA: Diagnosis not present

## 2019-03-21 DIAGNOSIS — I129 Hypertensive chronic kidney disease with stage 1 through stage 4 chronic kidney disease, or unspecified chronic kidney disease: Secondary | ICD-10-CM | POA: Diagnosis not present

## 2019-03-21 DIAGNOSIS — N183 Chronic kidney disease, stage 3 (moderate): Secondary | ICD-10-CM | POA: Diagnosis not present

## 2019-03-21 DIAGNOSIS — Z7901 Long term (current) use of anticoagulants: Secondary | ICD-10-CM | POA: Diagnosis not present

## 2019-03-24 DIAGNOSIS — N183 Chronic kidney disease, stage 3 (moderate): Secondary | ICD-10-CM | POA: Diagnosis not present

## 2019-03-24 DIAGNOSIS — I129 Hypertensive chronic kidney disease with stage 1 through stage 4 chronic kidney disease, or unspecified chronic kidney disease: Secondary | ICD-10-CM | POA: Diagnosis not present

## 2019-03-24 DIAGNOSIS — I716 Thoracoabdominal aortic aneurysm, without rupture: Secondary | ICD-10-CM | POA: Diagnosis not present

## 2019-03-24 DIAGNOSIS — I4821 Permanent atrial fibrillation: Secondary | ICD-10-CM | POA: Diagnosis not present

## 2019-03-24 DIAGNOSIS — I350 Nonrheumatic aortic (valve) stenosis: Secondary | ICD-10-CM | POA: Diagnosis not present

## 2019-03-24 DIAGNOSIS — Z8673 Personal history of transient ischemic attack (TIA), and cerebral infarction without residual deficits: Secondary | ICD-10-CM | POA: Diagnosis not present

## 2019-03-27 DIAGNOSIS — Z8673 Personal history of transient ischemic attack (TIA), and cerebral infarction without residual deficits: Secondary | ICD-10-CM | POA: Diagnosis not present

## 2019-03-27 DIAGNOSIS — N183 Chronic kidney disease, stage 3 (moderate): Secondary | ICD-10-CM | POA: Diagnosis not present

## 2019-03-27 DIAGNOSIS — I4821 Permanent atrial fibrillation: Secondary | ICD-10-CM | POA: Diagnosis not present

## 2019-03-27 DIAGNOSIS — I716 Thoracoabdominal aortic aneurysm, without rupture: Secondary | ICD-10-CM | POA: Diagnosis not present

## 2019-03-27 DIAGNOSIS — I350 Nonrheumatic aortic (valve) stenosis: Secondary | ICD-10-CM | POA: Diagnosis not present

## 2019-03-27 DIAGNOSIS — I129 Hypertensive chronic kidney disease with stage 1 through stage 4 chronic kidney disease, or unspecified chronic kidney disease: Secondary | ICD-10-CM | POA: Diagnosis not present

## 2019-03-29 DIAGNOSIS — Z8673 Personal history of transient ischemic attack (TIA), and cerebral infarction without residual deficits: Secondary | ICD-10-CM | POA: Diagnosis not present

## 2019-03-29 DIAGNOSIS — I716 Thoracoabdominal aortic aneurysm, without rupture: Secondary | ICD-10-CM | POA: Diagnosis not present

## 2019-03-29 DIAGNOSIS — I4821 Permanent atrial fibrillation: Secondary | ICD-10-CM | POA: Diagnosis not present

## 2019-03-29 DIAGNOSIS — N183 Chronic kidney disease, stage 3 (moderate): Secondary | ICD-10-CM | POA: Diagnosis not present

## 2019-03-29 DIAGNOSIS — I350 Nonrheumatic aortic (valve) stenosis: Secondary | ICD-10-CM | POA: Diagnosis not present

## 2019-03-29 DIAGNOSIS — I129 Hypertensive chronic kidney disease with stage 1 through stage 4 chronic kidney disease, or unspecified chronic kidney disease: Secondary | ICD-10-CM | POA: Diagnosis not present

## 2019-04-03 DIAGNOSIS — Z8673 Personal history of transient ischemic attack (TIA), and cerebral infarction without residual deficits: Secondary | ICD-10-CM | POA: Diagnosis not present

## 2019-04-03 DIAGNOSIS — I350 Nonrheumatic aortic (valve) stenosis: Secondary | ICD-10-CM | POA: Diagnosis not present

## 2019-04-03 DIAGNOSIS — N183 Chronic kidney disease, stage 3 (moderate): Secondary | ICD-10-CM | POA: Diagnosis not present

## 2019-04-03 DIAGNOSIS — I129 Hypertensive chronic kidney disease with stage 1 through stage 4 chronic kidney disease, or unspecified chronic kidney disease: Secondary | ICD-10-CM | POA: Diagnosis not present

## 2019-04-03 DIAGNOSIS — I716 Thoracoabdominal aortic aneurysm, without rupture: Secondary | ICD-10-CM | POA: Diagnosis not present

## 2019-04-03 DIAGNOSIS — I4821 Permanent atrial fibrillation: Secondary | ICD-10-CM | POA: Diagnosis not present

## 2019-04-12 DIAGNOSIS — Z8673 Personal history of transient ischemic attack (TIA), and cerebral infarction without residual deficits: Secondary | ICD-10-CM | POA: Diagnosis not present

## 2019-04-12 DIAGNOSIS — I716 Thoracoabdominal aortic aneurysm, without rupture: Secondary | ICD-10-CM | POA: Diagnosis not present

## 2019-04-12 DIAGNOSIS — I4821 Permanent atrial fibrillation: Secondary | ICD-10-CM | POA: Diagnosis not present

## 2019-04-12 DIAGNOSIS — N183 Chronic kidney disease, stage 3 (moderate): Secondary | ICD-10-CM | POA: Diagnosis not present

## 2019-04-12 DIAGNOSIS — I350 Nonrheumatic aortic (valve) stenosis: Secondary | ICD-10-CM | POA: Diagnosis not present

## 2019-04-12 DIAGNOSIS — I129 Hypertensive chronic kidney disease with stage 1 through stage 4 chronic kidney disease, or unspecified chronic kidney disease: Secondary | ICD-10-CM | POA: Diagnosis not present

## 2019-04-21 DIAGNOSIS — Z7901 Long term (current) use of anticoagulants: Secondary | ICD-10-CM | POA: Diagnosis not present

## 2019-04-21 DIAGNOSIS — I4891 Unspecified atrial fibrillation: Secondary | ICD-10-CM | POA: Diagnosis not present

## 2019-05-22 DIAGNOSIS — I6930 Unspecified sequelae of cerebral infarction: Secondary | ICD-10-CM | POA: Diagnosis not present

## 2019-05-22 DIAGNOSIS — E7800 Pure hypercholesterolemia, unspecified: Secondary | ICD-10-CM | POA: Diagnosis not present

## 2019-05-22 DIAGNOSIS — I4891 Unspecified atrial fibrillation: Secondary | ICD-10-CM | POA: Diagnosis not present

## 2019-05-22 DIAGNOSIS — Z7901 Long term (current) use of anticoagulants: Secondary | ICD-10-CM | POA: Diagnosis not present

## 2019-05-22 DIAGNOSIS — I712 Thoracic aortic aneurysm, without rupture: Secondary | ICD-10-CM | POA: Diagnosis not present

## 2019-05-22 DIAGNOSIS — I700 Atherosclerosis of aorta: Secondary | ICD-10-CM | POA: Diagnosis not present

## 2019-05-22 DIAGNOSIS — F322 Major depressive disorder, single episode, severe without psychotic features: Secondary | ICD-10-CM | POA: Diagnosis not present

## 2019-05-24 DIAGNOSIS — N3941 Urge incontinence: Secondary | ICD-10-CM | POA: Diagnosis not present

## 2019-05-24 DIAGNOSIS — R310 Gross hematuria: Secondary | ICD-10-CM | POA: Diagnosis not present

## 2019-05-24 DIAGNOSIS — N401 Enlarged prostate with lower urinary tract symptoms: Secondary | ICD-10-CM | POA: Diagnosis not present

## 2019-05-24 DIAGNOSIS — R350 Frequency of micturition: Secondary | ICD-10-CM | POA: Diagnosis not present

## 2019-06-19 DIAGNOSIS — Z7901 Long term (current) use of anticoagulants: Secondary | ICD-10-CM | POA: Diagnosis not present

## 2019-06-19 DIAGNOSIS — I4891 Unspecified atrial fibrillation: Secondary | ICD-10-CM | POA: Diagnosis not present

## 2019-07-17 DIAGNOSIS — Z0000 Encounter for general adult medical examination without abnormal findings: Secondary | ICD-10-CM | POA: Diagnosis not present

## 2019-07-17 DIAGNOSIS — I4891 Unspecified atrial fibrillation: Secondary | ICD-10-CM | POA: Diagnosis not present

## 2019-07-17 DIAGNOSIS — Z7901 Long term (current) use of anticoagulants: Secondary | ICD-10-CM | POA: Diagnosis not present

## 2019-07-31 ENCOUNTER — Other Ambulatory Visit: Payer: Self-pay

## 2019-07-31 ENCOUNTER — Ambulatory Visit (HOSPITAL_COMMUNITY): Payer: Medicare Other | Attending: Cardiology

## 2019-07-31 DIAGNOSIS — I350 Nonrheumatic aortic (valve) stenosis: Secondary | ICD-10-CM | POA: Insufficient documentation

## 2019-08-02 ENCOUNTER — Encounter: Payer: Self-pay | Admitting: Cardiovascular Disease

## 2019-08-02 ENCOUNTER — Telehealth (INDEPENDENT_AMBULATORY_CARE_PROVIDER_SITE_OTHER): Payer: Medicare Other | Admitting: Cardiovascular Disease

## 2019-08-02 VITALS — BP 109/57 | HR 77 | Temp 97.3°F | Ht 73.0 in | Wt 211.0 lb

## 2019-08-02 DIAGNOSIS — E7800 Pure hypercholesterolemia, unspecified: Secondary | ICD-10-CM

## 2019-08-02 DIAGNOSIS — Z7901 Long term (current) use of anticoagulants: Secondary | ICD-10-CM

## 2019-08-02 DIAGNOSIS — I350 Nonrheumatic aortic (valve) stenosis: Secondary | ICD-10-CM

## 2019-08-02 DIAGNOSIS — I739 Peripheral vascular disease, unspecified: Secondary | ICD-10-CM

## 2019-08-02 DIAGNOSIS — I4821 Permanent atrial fibrillation: Secondary | ICD-10-CM | POA: Diagnosis not present

## 2019-08-02 DIAGNOSIS — I712 Thoracic aortic aneurysm, without rupture: Secondary | ICD-10-CM

## 2019-08-02 DIAGNOSIS — I7121 Aneurysm of the ascending aorta, without rupture: Secondary | ICD-10-CM

## 2019-08-02 NOTE — Patient Instructions (Signed)
Medication Instructions:  No changes *If you need a refill on your cardiac medications before your next appointment, please call your pharmacy*  Lab Work: None ordered If you have labs (blood work) drawn today and your tests are completely normal, you will receive your results only by: Marland Kitchen MyChart Message (if you have MyChart) OR . A paper copy in the mail If you have any lab test that is abnormal or we need to change your treatment, we will call you to review the results.  Testing/Procedures: Your physician has requested that you have an echocardiogram in one year. Echocardiography is a painless test that uses sound waves to create images of your heart. It provides your doctor with information about the size and shape of your heart and how well your heart's chambers and valves are working. You may receive an ultrasound enhancing agent through an IV if needed to better visualize your heart during the echo.This procedure takes approximately one hour. There are no restrictions for this procedure. This will take place at the [address], [address].    Follow-Up: At [facility], you and your health needs are our priority.  As part of our continuing mission to provide you with exceptional heart care, we have created designated Provider Care Teams.  These Care Teams include your primary Cardiologist (physician) and Advanced Practice Providers (APPs -  Physician Assistants and Nurse Practitioners) who all work together to provide you with the care you need, when you need it.  Your next appointment:   12 months after the echo  The format for your next appointment:   In Person  Provider:   [name], MD

## 2019-08-02 NOTE — Progress Notes (Signed)
Virtual Visit via Telephone Note   This visit type was conducted due to national recommendations for restrictions regarding the COVID-19 Pandemic (e.g. social distancing) in an effort to limit this patient's exposure and mitigate transmission in our community.  Due to his co-morbid illnesses, this patient is at least at moderate risk for complications without adequate follow up.  This format is felt to be most appropriate for this patient at this time.  The patient did not have access to video technology/had technical difficulties with video requiring transitioning to audio format only (telephone).  All issues noted in this document were discussed and addressed.  No physical exam could be performed with this format.  Please refer to the patient's chart for his  consent to telehealth for [facility].   Date:  08/02/2019   ID:  [name], DOB [birth year], MRN [id number]  Patient Location: Home Provider Location: Office  PCP:  Orpah Melter, MD  Cardiologist:  [name], MD  Electrophysiologist:  None   Evaluation Performed:  Follow-Up Visit  Chief Complaint:  AS, Ao aneurysm, AFib  History of Present Illness:    KRISHANG READING is a 85 y.o. male with JERRARD BRADBURN is a 85 y.o. male with a moderate aneurysm of the ascending aorta (stable at 4.5 cm in size on serial CT), moderate aortic stenosis, permanent atrial fibrillation with history of previous stroke and systemic embolism and hyperlipidemia returning for a yearly visit.   For the most part he is doing well and has no new specific complaints.  His legs feel weak and he uses a cane, but he has not had any falls or injuries.  He denies exertional chest discomfort or exertional dizziness/syncope.  He does have some exertional dyspnea but this has not really changed over the last several years.  He continues to have occasional problems with hematuria and is to see Dr. Diona Fanti next week.  In the past, temporary  interruption of his warfarin for hematuria led to an embolic event to his left iliac artery.  He does not describe intermittent claudication and does not have leg edema.  He had an appointment with Dr. [name] after undergoing a CT angiogram of the chest in February 2020 which showed no change in his moderate ascending aortic aneurysm measuring 4.5 cm.  Dr. [name] recommended that we stop yearly monitoring since the size of the aneurysm was stable and Mr. Spindle is unlikely to ever be a surgical candidate.  Titan just had a follow-up echocardiogram on October 26.  It shows progression of the aortic stenosis which is now moderate.  The mean gradient was 27 mmHg, the calculated valve area was 1 cm.  The dimensionless valve index was actually in the severe range of 0.2.  He continues to have normal left ventricular systolic function.  He has mild-moderate concentric LVH.  His left atrium is massively dilated.  The patient specifically denies any chest pain at rest or exertion, dyspnea at rest,  orthopnea, paroxysmal nocturnal dyspnea, syncope, palpitations, focal neurological deficits, intermittent claudication, lower extremity edema, unexplained weight gain, cough, hemoptysis or wheezing. Marland Kitchen He does have reduced arterial flow to the left lower extremity with an ABI of 0.82.  He is very hard of hearing.  His wife helps with most of the interview today.  He has never required cardiac catheterization. He had a mildly abnormal nuclear stress test in 2003 suggestive of lateral wall ischemia, but I don't think this was ever associated with  symptoms.  The patient does not have symptoms concerning for COVID-19 infection (fever, chills, cough, or new shortness of breath).    Past Medical History:  Diagnosis Date  . AAA (abdominal aortic aneurysm) ([place])    Possibly an erroneous entry: only imaging i see is 2017 and specifically shows "no aneurysmal dilation" of abdominal aorta  . Aortic stenosis    a.  mild-mod by echo 06/2016.  Marland Kitchen Atrial fibrillation ([place])   . CKD (chronic kidney disease), stage III    baseline Cr 1.4-1.5 in 2016-2017 by PCP)  . CVA (cerebral infarction)   . Depression   . DVT (deep venous thrombosis) ([place]) 2006  . Dyslipidemia   . Hematuria   . HOH (hard of hearing)   . Hypercholesterolemia   . OSA (obstructive sleep apnea)    intol to CPAP  . Permanent atrial fibrillation ([place])   . Prostate enlargement   . Pulmonary nodules   . Thoracic ascending aortic aneurysm ([place])    4.5cm in 11/2018, stable since 2016   Past Surgical History:  Procedure Laterality Date  . EMBOLECTOMY Left 07/02/2016   Procedure: EMBOLECTOMY LEFT FEMORAL ARTERY;  Surgeon: [name], MD;  Location: [facility];  Service: Vascular;  Laterality: Left;  . NM MYOCAR PERF WALL MOTION  08/24/2002   markedly positive,subtle lateral ischemia  . PROSTATE SURGERY    . US ECHOCARDIOGRAPHY  01/06/2012   mod. AOV ca+,mild to mod. AS,mod mostly posterior MAC-mild MR     Current Meds  Medication Sig  . atorvastatin (LIPITOR) 80 MG tablet Take 1 tablet (80 mg total) by mouth daily.  Marland Kitchen b complex vitamins capsule Take 1 capsule by mouth daily.  . finasteride (PROSCAR) 5 MG tablet Take 5 mg by mouth daily.  Marland Kitchen warfarin (COUMADIN) 5 MG tablet Take 7.5 mg by mouth daily.      Allergies:   Flomax [tamsulosin hcl]   Social History   Tobacco Use  . Smoking status: Former Smoker    Packs/day: 1.00    Years: 10.00    Pack years: 10.00    Types: Cigarettes    Quit date: 10/05/1952    Years since quitting: 66.8  . Smokeless tobacco: Former Systems developer    Quit date: 10/05/1976  Substance Use Topics  . Alcohol use: Yes    Alcohol/week: 0.0 standard drinks  . Drug use: No     Family Hx: The patient's family history includes Diabetes in his father; Heart failure in his mother; Stroke in his father.  ROS:   Please see the history of present illness.     All other systems reviewed and are negative.   Prior CV  studies:   The following studies were reviewed today: CT angiogram of the aorta and visit with CV surgery  Labs/Other Tests and Data Reviewed:    EKG:  ECG from July 19 2018 shows permanent atrial fibrillation with controlled ventricular response and incomplete left bundle branch block  Recent Labs: 03/15/2019: ALT 15; BUN 27; Creatinine, Ser 1.70; Hemoglobin 14.3; Platelets 155; Potassium 4.9; Sodium 139   Recent Lipid Panel Lab Results  Component Value Date/Time   CHOL 154 03/16/2019 04:46 AM   TRIG 108 03/16/2019 04:46 AM   HDL 38 (L) 03/16/2019 04:46 AM   CHOLHDL 4.1 03/16/2019 04:46 AM   LDLCALC 94 03/16/2019 04:46 AM  05/22/2019 total cholesterol 146, triglycerides 124, HDL 43, LDL 78  Wt Readings from Last 3 Encounters:  08/02/19 211 lb (95.7 kg)  03/15/19  212 lb 4.9 oz (96.3 kg)  11/23/18 215 lb (97.5 kg)     Objective:    Vital Signs:  BP (!) 109/57   Pulse 77   Temp (!) 97.3 F (36.3 C)   Ht _0  (1.854 m)   Wt 211 lb (95.7 kg)   BMI 27.84 kg/m    VITAL SIGNS:  reviewed Unable to examine  ASSESSMENT & PLAN:    ASSESSMENT:    1. Permanent atrial fibrillation ([place])   2. Ascending aortic aneurysm ([place])   3. Aortic valve stenosis, nonrheumatic   4. PAD (peripheral artery disease) ([place])   5. Long term current use of anticoagulant   6. Hypercholesterolemia      PLAN:  In order of problems listed above:  1. AFib: Rate is controlled without medications suggesting he has some degree of AV node disease. CHADSVasc at least 5 (age 85, CVA 2, PAD). Continue anticoagulation.  Note that he developed embolism when warfarin was instructed for hematuria.  Anticoagulation should only be interrupted when critically necessary and for the shortest possible amount of time, bridging with enoxaparin if appropriate. 2. Asc Ao Aneurysm: stable at 4.5 cm in diameter, followed by Dr. Gilford Raid.  With plan for follow-up in February 2020.  Check echo before his next  appointment 3. AS: He has progressed to moderate aortic stenosis but there is been no interval change in symptoms.  Asked the patient and his wife to call if he develops exertional angina/exertional syncope/worsening exertional dyspnea.  I do not think Eber would be a candidate for surgical aortic valve replacement, but he might be a reasonable candidate for TAVR. 4. PAD: He is sedentary.  No intermittent claudication for his activity level.Marland Kitchen  ABI 0.8 on the left, diminished following his episodes of iliac artery embolism. 5. Warfarin: Anticoagulation monitored in Dr. Jacqlyn Larsen office.  Reminded him that this should be interrupted only for very serious bleeding complications, since he has had systemic embolism with previous interruption in anticoagulation. 6. HLP: Most recent lipid profile from August shows an LDL very close to the desirable range at 78.  He is on the maximum dose of atorvastatin.  No changes were made to his medications.  COVID-19 Education: The signs and symptoms of COVID-19 were discussed with the patient and how to seek care for testing (follow up with PCP or arrange E-visit).  The importance of social distancing was discussed today.  Time:   Today, I have spent 19 minutes with the patient with telehealth technology discussing the above problems.     Medication Adjustments/Labs and Tests Ordered: Current medicines are reviewed at length with the patient today.  Concerns regarding medicines are outlined above.   Tests Ordered: Orders Placed This Encounter  Procedures  . ECHOCARDIOGRAM COMPLETE    Medication Changes: No orders of the defined types were placed in this encounter.   Follow Up:  In Person 1 year  Signed, [name], MD  08/02/2019 2:25 PM    [facility] Medication Adjustments/Labs and Tests Ordered: Current medicines are reviewed at length with the patient today.  Concerns regarding medicines are outlined above.  Medication  changes, Labs and Tests ordered today are listed in the Patient Instructions below. Patient Instructions  Medication Instructions:  No changes *If you need a refill on your cardiac medications before your next appointment, please call your pharmacy*  Lab Work: None ordered If you have labs (blood work) drawn today and your tests are completely normal,  you will receive your results only by: Marland Kitchen MyChart Message (if you have MyChart) OR . A paper copy in the mail If you have any lab test that is abnormal or we need to change your treatment, we will call you to review the results.  Testing/Procedures: Your physician has requested that you have an echocardiogram in one year. Echocardiography is a painless test that uses sound waves to create images of your heart. It provides your doctor with information about the size and shape of your heart and how well your heart's chambers and valves are working. You may receive an ultrasound enhancing agent through an IV if needed to better visualize your heart during the echo.This procedure takes approximately one hour. There are no restrictions for this procedure. This will take place at the [address].    Follow-Up: At [facility], you and your health needs are our priority.  As part of our continuing mission to provide you with exceptional heart care, we have created designated Provider Care Teams.  These Care Teams include your primary Cardiologist (physician) and Advanced Practice Providers (APPs -  Physician Assistants and Nurse Practitioners) who all work together to provide you with the care you need, when you need it.  Your next appointment:   12 months after the echo  The format for your next appointment:   In Person  Provider:   [name], MD       Signed, [name], MD  08/02/2019 2:25 PM    [facility] [place], [place], [place]  [address] Phone: [phone]; Fax: [phone]

## 2019-08-05 ENCOUNTER — Other Ambulatory Visit (HOSPITAL_BASED_OUTPATIENT_CLINIC_OR_DEPARTMENT_OTHER): Payer: Self-pay

## 2019-08-05 ENCOUNTER — Encounter (HOSPITAL_BASED_OUTPATIENT_CLINIC_OR_DEPARTMENT_OTHER): Payer: Self-pay | Admitting: Emergency Medicine

## 2019-08-05 ENCOUNTER — Inpatient Hospital Stay (HOSPITAL_BASED_OUTPATIENT_CLINIC_OR_DEPARTMENT_OTHER)
Admission: EM | Admit: 2019-08-05 | Discharge: 2019-08-15 | DRG: 357 | Disposition: A | Discharge status: Home Health | Payer: Medicare Other | Attending: Vascular Surgery | Admitting: Vascular Surgery

## 2019-08-05 ENCOUNTER — Other Ambulatory Visit: Payer: Self-pay

## 2019-08-05 ENCOUNTER — Emergency Department (HOSPITAL_BASED_OUTPATIENT_CLINIC_OR_DEPARTMENT_OTHER): Payer: Medicare Other

## 2019-08-05 DIAGNOSIS — I82890 Acute embolism and thrombosis of other specified veins: Secondary | ICD-10-CM | POA: Diagnosis not present

## 2019-08-05 DIAGNOSIS — I499 Cardiac arrhythmia, unspecified: Secondary | ICD-10-CM | POA: Diagnosis not present

## 2019-08-05 DIAGNOSIS — I472 Ventricular tachycardia: Secondary | ICD-10-CM | POA: Diagnosis not present

## 2019-08-05 DIAGNOSIS — N1832 Chronic kidney disease, stage 3b: Secondary | ICD-10-CM | POA: Diagnosis present

## 2019-08-05 DIAGNOSIS — Z7901 Long term (current) use of anticoagulants: Secondary | ICD-10-CM

## 2019-08-05 DIAGNOSIS — H9193 Unspecified hearing loss, bilateral: Secondary | ICD-10-CM | POA: Diagnosis present

## 2019-08-05 DIAGNOSIS — K567 Ileus, unspecified: Secondary | ICD-10-CM | POA: Diagnosis not present

## 2019-08-05 DIAGNOSIS — I4891 Unspecified atrial fibrillation: Secondary | ICD-10-CM

## 2019-08-05 DIAGNOSIS — K55069 Acute infarction of intestine, part and extent unspecified: Secondary | ICD-10-CM

## 2019-08-05 DIAGNOSIS — R0902 Hypoxemia: Secondary | ICD-10-CM | POA: Diagnosis not present

## 2019-08-05 DIAGNOSIS — Z20828 Contact with and (suspected) exposure to other viral communicable diseases: Secondary | ICD-10-CM | POA: Diagnosis present

## 2019-08-05 DIAGNOSIS — K55049 Acute infarction of large intestine, extent unspecified: Principal | ICD-10-CM | POA: Diagnosis present

## 2019-08-05 DIAGNOSIS — Z974 Presence of external hearing-aid: Secondary | ICD-10-CM

## 2019-08-05 DIAGNOSIS — R52 Pain, unspecified: Secondary | ICD-10-CM | POA: Diagnosis not present

## 2019-08-05 DIAGNOSIS — E8770 Fluid overload, unspecified: Secondary | ICD-10-CM | POA: Diagnosis not present

## 2019-08-05 DIAGNOSIS — Z8249 Family history of ischemic heart disease and other diseases of the circulatory system: Secondary | ICD-10-CM | POA: Diagnosis not present

## 2019-08-05 DIAGNOSIS — I083 Combined rheumatic disorders of mitral, aortic and tricuspid valves: Secondary | ICD-10-CM | POA: Diagnosis present

## 2019-08-05 DIAGNOSIS — Z823 Family history of stroke: Secondary | ICD-10-CM

## 2019-08-05 DIAGNOSIS — R109 Unspecified abdominal pain: Secondary | ICD-10-CM | POA: Diagnosis not present

## 2019-08-05 DIAGNOSIS — Z86718 Personal history of other venous thrombosis and embolism: Secondary | ICD-10-CM

## 2019-08-05 DIAGNOSIS — I4821 Permanent atrial fibrillation: Secondary | ICD-10-CM | POA: Diagnosis present

## 2019-08-05 DIAGNOSIS — Z87891 Personal history of nicotine dependence: Secondary | ICD-10-CM | POA: Diagnosis not present

## 2019-08-05 DIAGNOSIS — E785 Hyperlipidemia, unspecified: Secondary | ICD-10-CM | POA: Diagnosis not present

## 2019-08-05 DIAGNOSIS — R791 Abnormal coagulation profile: Secondary | ICD-10-CM | POA: Diagnosis not present

## 2019-08-05 DIAGNOSIS — N400 Enlarged prostate without lower urinary tract symptoms: Secondary | ICD-10-CM | POA: Diagnosis not present

## 2019-08-05 DIAGNOSIS — R1013 Epigastric pain: Secondary | ICD-10-CM | POA: Diagnosis not present

## 2019-08-05 DIAGNOSIS — K559 Vascular disorder of intestine, unspecified: Secondary | ICD-10-CM

## 2019-08-05 DIAGNOSIS — Z79899 Other long term (current) drug therapy: Secondary | ICD-10-CM

## 2019-08-05 DIAGNOSIS — I700 Atherosclerosis of aorta: Secondary | ICD-10-CM | POA: Diagnosis present

## 2019-08-05 DIAGNOSIS — Z03818 Encounter for observation for suspected exposure to other biological agents ruled out: Secondary | ICD-10-CM | POA: Diagnosis not present

## 2019-08-05 DIAGNOSIS — I712 Thoracic aortic aneurysm, without rupture: Secondary | ICD-10-CM | POA: Diagnosis not present

## 2019-08-05 DIAGNOSIS — G4733 Obstructive sleep apnea (adult) (pediatric): Secondary | ICD-10-CM | POA: Diagnosis present

## 2019-08-05 DIAGNOSIS — I739 Peripheral vascular disease, unspecified: Secondary | ICD-10-CM | POA: Diagnosis present

## 2019-08-05 DIAGNOSIS — D62 Acute posthemorrhagic anemia: Secondary | ICD-10-CM | POA: Diagnosis not present

## 2019-08-05 DIAGNOSIS — Z888 Allergy status to other drugs, medicaments and biological substances status: Secondary | ICD-10-CM

## 2019-08-05 DIAGNOSIS — R1084 Generalized abdominal pain: Secondary | ICD-10-CM | POA: Diagnosis not present

## 2019-08-05 DIAGNOSIS — Z8673 Personal history of transient ischemic attack (TIA), and cerebral infarction without residual deficits: Secondary | ICD-10-CM | POA: Diagnosis not present

## 2019-08-05 DIAGNOSIS — I878 Other specified disorders of veins: Secondary | ICD-10-CM

## 2019-08-05 LAB — CBC WITH DIFFERENTIAL/PLATELET
Abs Immature Granulocytes: 0.04 10*3/uL (ref 0.00–0.07)
Basophils Absolute: 0.1 10*3/uL (ref 0.0–0.1)
Basophils Relative: 1 %
Eosinophils Absolute: 0.2 10*3/uL (ref 0.0–0.5)
Eosinophils Relative: 2 %
HCT: 44.7 % (ref 39.0–52.0)
Hemoglobin: 14.4 g/dL (ref 13.0–17.0)
Immature Granulocytes: 0 %
Lymphocytes Relative: 12 %
Lymphs Abs: 1 10*3/uL (ref 0.7–4.0)
MCH: 31.4 pg (ref 26.0–34.0)
MCHC: 32.2 g/dL (ref 30.0–36.0)
MCV: 97.4 fL (ref 80.0–100.0)
Monocytes Absolute: 0.5 10*3/uL (ref 0.1–1.0)
Monocytes Relative: 6 %
Neutro Abs: 7.1 10*3/uL (ref 1.7–7.7)
Neutrophils Relative %: 79 %
Platelets: 156 10*3/uL (ref 150–400)
RBC: 4.59 MIL/uL (ref 4.22–5.81)
RDW: 13.1 % (ref 11.5–15.5)
WBC: 8.9 10*3/uL (ref 4.0–10.5)
nRBC: 0 % (ref 0.0–0.2)

## 2019-08-05 LAB — COMPREHENSIVE METABOLIC PANEL
ALT: 27 U/L (ref 0–44)
AST: 33 U/L (ref 15–41)
Albumin: 3.7 g/dL (ref 3.5–5.0)
Alkaline Phosphatase: 50 U/L (ref 38–126)
Anion gap: 10 (ref 5–15)
BUN: 26 mg/dL — ABNORMAL HIGH (ref 8–23)
CO2: 25 mmol/L (ref 22–32)
Calcium: 8.9 mg/dL (ref 8.9–10.3)
Chloride: 106 mmol/L (ref 98–111)
Creatinine, Ser: 1.53 mg/dL — ABNORMAL HIGH (ref 0.61–1.24)
GFR calc Af Amer: 46 mL/min — ABNORMAL LOW (ref >60)
GFR calc non Af Amer: 40 mL/min — ABNORMAL LOW (ref >60)
Glucose, Bld: 138 mg/dL — ABNORMAL HIGH (ref 70–99)
Potassium: 3.9 mmol/L (ref 3.5–5.1)
Sodium: 141 mmol/L (ref 135–145)
Total Bilirubin: 0.9 mg/dL (ref 0.3–1.2)
Total Protein: 7.4 g/dL (ref 6.5–8.1)

## 2019-08-05 LAB — PROTIME-INR
INR: 2 — ABNORMAL HIGH (ref 0.8–1.2)
Prothrombin Time: 22.5 seconds — ABNORMAL HIGH (ref 11.4–15.2)

## 2019-08-05 LAB — LACTIC ACID, PLASMA: Lactic Acid, Venous: 1.4 mmol/L (ref 0.5–1.9)

## 2019-08-05 LAB — SARS CORONAVIRUS 2 BY RT PCR (HOSPITAL ORDER, PERFORMED IN ~~LOC~~ HOSPITAL LAB): SARS Coronavirus 2: NEGATIVE

## 2019-08-05 LAB — LIPASE, BLOOD: Lipase: 36 U/L (ref 11–51)

## 2019-08-05 MED ORDER — MORPHINE SULFATE (PF) 4 MG/ML IV SOLN
4.0000 mg | Freq: Once | INTRAVENOUS | Status: AC
  Administered 2019-08-05: 4 mg via INTRAVENOUS
  Filled 2019-08-05: qty 1

## 2019-08-05 MED ORDER — HEPARIN (PORCINE) 25000 UT/250ML-% IV SOLN
1500.0000 [IU]/h | INTRAVENOUS | Status: DC
  Administered 2019-08-05: 1500 [IU]/h via INTRAVENOUS
  Filled 2019-08-05: qty 250

## 2019-08-05 MED ORDER — ONDANSETRON HCL 4 MG/2ML IJ SOLN
4.0000 mg | Freq: Once | INTRAMUSCULAR | Status: AC
  Administered 2019-08-05: 22:00:00 4 mg via INTRAVENOUS
  Filled 2019-08-05: qty 2

## 2019-08-05 MED ORDER — IOHEXOL 350 MG/ML SOLN
80.0000 mL | Freq: Once | INTRAVENOUS | Status: AC | PRN
  Administered 2019-08-05: 80 mL via INTRAVENOUS

## 2019-08-05 MED ORDER — SODIUM CHLORIDE 0.9 % IV SOLN
INTRAVENOUS | Status: DC
  Administered 2019-08-05: 23:00:00 via INTRAVENOUS

## 2019-08-05 NOTE — ED Notes (Signed)
[facility] notified [facility]) - patient ready to transport to [facility] ED

## 2019-08-05 NOTE — ED Provider Notes (Signed)
[facility] EMERGENCY DEPARTMENT Provider Note   CSN: [id number] Arrival date & time: 08/05/19  1951     History   Chief Complaint Chief Complaint  Patient presents with   Abdominal Pain    HPI GEARLD KERSTEIN is a 85 y.o. male.  Presents emerge department with chief complaint abdominal pain.  Patient reports that he suddenly had abdominal pain after eating [employer] earlier this afternoon.  Patient currently states his pain is 6 out of 10 in severity, sharp, stabbing pain, and middle of his abdomen.  States that he has had some associated nausea, one episode of vomiting, nonbloody nonbilious.  Had one episode of loose stool, nonbloody.  States he never has abdominal pain like this.  History of A. fib on Coumadin.  Lives at home with his wife, fairly independent.  History obtained from patient and son at bedside.     HPI  Past Medical History:  Diagnosis Date   AAA (abdominal aortic aneurysm) ([place])    Possibly an erroneous entry: only imaging i see is 2017 and specifically shows "no aneurysmal dilation" of abdominal aorta   Aortic stenosis    a. mild-mod by echo 06/2016.   Atrial fibrillation ([place])    CKD (chronic kidney disease), stage III    baseline Cr 1.4-1.5 in 2016-2017 by PCP)   CVA (cerebral infarction)    Depression    DVT (deep venous thrombosis) ([place]) 2006   Dyslipidemia    Hematuria    HOH (hard of hearing)    Hypercholesterolemia    OSA (obstructive sleep apnea)    intol to CPAP   Permanent atrial fibrillation (HCC)    Prostate enlargement    Pulmonary nodules    Thoracic ascending aortic aneurysm ([place])    4.5cm in 11/2018, stable since 2016    Patient Active Problem List   Diagnosis Date Noted   Acute ischemic stroke ([place]) 03/15/2019   PAD (peripheral artery disease) ([place]) 07/23/2018   Ischemia of lower extremity 07/12/2016   Acute blood loss anemia 07/12/2016   Depression 07/12/2016   BPH (benign prostatic hyperplasia)  07/12/2016   AAA (abdominal aortic aneurysm) (HCC)    Atrial fibrillation ([place])    Embolism ([place]) 07/02/2016   CKD (chronic kidney disease), stage III ([place])    Claudication ([place]) 06/04/2016   Long term current use of anticoagulant 06/04/2016   Abnormal ECG 06/04/2016   Thoracic ascending aortic aneurysm (HCC) 04/22/2015   Permanent atrial fibrillation ([place]) 05/11/2013   Mild aortic stenosis 05/11/2013   Orthostatic hypotension 05/11/2013   Hypercholesterolemia 05/11/2013    Past Surgical History:  Procedure Laterality Date   EMBOLECTOMY Left 07/02/2016   Procedure: EMBOLECTOMY LEFT FEMORAL ARTERY;  Surgeon: [name], MD;  Location: [facility];  Service: Vascular;  Laterality: Left;   NM MYOCAR PERF WALL MOTION  08/24/2002   markedly positive,subtle lateral ischemia   PROSTATE SURGERY     US ECHOCARDIOGRAPHY  01/06/2012   mod. AOV ca+,mild to mod. AS,mod mostly posterior MAC-mild MR        Home Medications    Prior to Admission medications   Medication Sig Start Date End Date Taking? Authorizing Provider  atorvastatin (LIPITOR) 80 MG tablet Take 1 tablet (80 mg total) by mouth daily. 03/16/19 11/11/19  Barb Merino, MD  b complex vitamins capsule Take 1 capsule by mouth daily.    [provider]  finasteride (PROSCAR) 5 MG tablet Take 5 mg by mouth daily.    [provider]  warfarin (COUMADIN) 5 MG tablet Take 7.5 mg by mouth daily.     [provider]    Family History Family History  Problem Relation Age of Onset   Heart failure Mother    Diabetes Father    Stroke Father     Social History Social History   Tobacco Use   Smoking status: Former Smoker    Packs/day: 1.00    Years: 10.00    Pack years: 10.00    Types: Cigarettes    Quit date: 10/05/1952    Years since quitting: 66.8   Smokeless tobacco: Former Systems developer    Quit date: 10/05/1976  Substance Use Topics   Alcohol use: Yes    Alcohol/week: 0.0 standard drinks     Drug use: No     Allergies   Flomax [tamsulosin hcl]   Review of Systems Review of Systems  Constitutional: Negative for chills and fever.  HENT: Negative for ear pain and sore throat.   Eyes: Negative for pain and visual disturbance.  Respiratory: Negative for cough and shortness of breath.   Cardiovascular: Negative for chest pain and palpitations.  Gastrointestinal: Positive for abdominal pain, diarrhea and vomiting.  Genitourinary: Negative for dysuria and hematuria.  Musculoskeletal: Negative for arthralgias and back pain.  Skin: Negative for color change and rash.  Neurological: Negative for seizures and syncope.  All other systems reviewed and are negative.    Physical Exam Updated Vital Signs BP (!) 154/94 (BP Location: Right Arm)    Pulse (!) 107    Temp 98 F (36.7 C) (Oral)    Resp 20    Ht _0  (1.854 m)    Wt 99.8 kg    SpO2 98%    BMI 29.03 kg/m   Physical Exam Vitals signs and nursing note reviewed.  Constitutional:      Appearance: He is well-developed.  HENT:     Head: Normocephalic and atraumatic.  Eyes:     Conjunctiva/sclera: Conjunctivae normal.  Neck:     Musculoskeletal: Neck supple.  Cardiovascular:     Rate and Rhythm: Normal rate and regular rhythm.     Heart sounds: No murmur.  Pulmonary:     Effort: Pulmonary effort is normal. No respiratory distress.     Breath sounds: Normal breath sounds.  Abdominal:     Palpations: Abdomen is soft.     Tenderness: There is no abdominal tenderness.     Comments: Soft abdomen, no focal tenderness appreciated  Skin:    General: Skin is warm and dry.  Neurological:     General: No focal deficit present.     Mental Status: He is alert.  Psychiatric:        Mood and Affect: Mood normal.        Behavior: Behavior normal.      ED Treatments / Results  Labs (all labs ordered are listed, but only abnormal results are displayed) Labs Reviewed  COMPREHENSIVE METABOLIC PANEL - Abnormal; Notable  for the following components:      Result Value   Glucose, Bld 138 (*)    BUN 26 (*)    Creatinine, Ser 1.53 (*)    GFR calc non Af Amer 40 (*)    GFR calc Af Amer 46 (*)    All other components within normal limits  PROTIME-INR - Abnormal; Notable for the following components:   Prothrombin Time 22.5 (*)    INR 2.0 (*)    All other components  within normal limits  CBC WITH DIFFERENTIAL/PLATELET  LIPASE, BLOOD  URINALYSIS, ROUTINE W REFLEX MICROSCOPIC  LACTIC ACID, PLASMA  LACTIC ACID, PLASMA    EKG None  Radiology Ct Angio Abd/pel W And/or Wo Contrast  Result Date: 08/05/2019 CLINICAL DATA:  Severe epigastric pain in the setting of atrial fibrillation. Concern for mesenteric ischemia. EXAM: CTA ABDOMEN AND PELVIS WITHOUT AND WITH CONTRAST TECHNIQUE: Multidetector CT imaging of the abdomen and pelvis was performed using the standard protocol during bolus administration of intravenous contrast. Multiplanar reconstructed images and MIPs were obtained and reviewed to evaluate the vascular anatomy. CONTRAST:  65m OMNIPAQUE IOHEXOL 350 MG/ML SOLN COMPARISON:  CT dated April 22, 2015 FINDINGS: VASCULAR Aorta: There are atherosclerotic changes throughout the abdominal aorta without evidence for an abdominal aortic aneurysm. Celiac: Patent without evidence of aneurysm, dissection, vasculitis or significant stenosis. SMA: The proximal SMA is patent. There is an acute occlusion of the mid SMA over length of approximately 3.7 cm (axial series 4, image 51 and sagittal series 6, image 55). The distal SMA branches are patent. Renals: Both renal arteries are patent without evidence of aneurysm, dissection, vasculitis, fibromuscular dysplasia or significant stenosis. IMA: Patent without evidence of aneurysm, dissection, vasculitis or significant stenosis. Inflow: Patent without evidence of aneurysm, dissection, vasculitis or significant stenosis. Proximal Outflow: Bilateral common femoral and visualized  portions of the superficial and profunda femoral arteries are patent without evidence of aneurysm, dissection, vasculitis or significant stenosis. Veins: No obvious venous abnormality within the limitations of this arterial phase study. Review of the MIP images confirms the above findings. NON-VASCULAR Lower chest: The lung bases are clear.The heart is significantly enlarged. Hepatobiliary: The liver is normal. Normal gallbladder.There is no biliary ductal dilation. Pancreas: There is a 1.7 cm hypoattenuating area within the pancreatic body (axial series 4, image 41). Spleen: No splenic laceration or hematoma. Adrenals/Urinary Tract: --Adrenal glands: No adrenal hemorrhage. --Right kidney/ureter: No hydronephrosis or perinephric hematoma. --Left kidney/ureter: There is a large cyst involving the left kidney. No left-sided hydronephrosis. There is some cortical thinning of the left kidney. --Urinary bladder: The urinary bladder is distended. Stomach/Bowel: --Stomach/Duodenum: No hiatal hernia or other gastric abnormality. Normal duodenal course and caliber. --Small bowel: No dilatation or inflammation. --Colon: There are scattered colonic diverticula without CT evidence for diverticulitis. There is some mild wall thickening of the transverse colon and splenic flexure of the colon. --Appendix: Normal. Lymphatic: --No retroperitoneal lymphadenopathy. --No mesenteric lymphadenopathy. --No pelvic or inguinal lymphadenopathy. Reproductive: The prostate gland is significantly enlarged. Other: No ascites or free air. The abdominal wall is normal. Musculoskeletal. No acute displaced fractures. IMPRESSION: 1. Acute thromboembolism of the SMA as detailed above. The distal SMA branches are patent. The IMA is patent. 2. Mild wall thickening of the transverse colon and splenic flexure may represent developing ischemic colitis in the setting of an SMA occlusion. Correlation with lactate is recommended. 3. Cardiomegaly. 4.  Prostatomegaly.  The urinary bladder is moderately distended. 5. Large left-sided renal cyst measuring approximately 12 cm. 6. There is a 1.7 cm hypoattenuating area within the pancreatic body. Follow-up with a nonemergent outpatient pancreatic protocol contrast enhanced MRI is recommended for further evaluation. These results were called by telephone at the time of interpretation on 08/05/2019 at 10:12 pm to [facility], who verbally acknowledged these results. Electronically Signed   By: CConstance HolsterM.D.   On: 08/05/2019 22:12    Procedures .Critical Care Performed by: DLucrezia Starch MD Authorized by: DLucrezia Starch  MD   Critical care provider statement:    Critical care time (minutes):  45   Critical care was necessary to treat or prevent imminent or life-threatening deterioration of the following conditions: mesenteric ischemia, thrombosis of SMA.   Critical care was time spent personally by me on the following activities:  Discussions with consultants, evaluation of patient's response to treatment, examination of patient, ordering and performing treatments and interventions, ordering and review of laboratory studies, ordering and review of radiographic studies, pulse oximetry, re-evaluation of patient's condition, obtaining history from patient or surrogate and review of old charts   (including critical care time)  Medications Ordered in ED Medications  0.9 %  sodium chloride infusion (has no administration in time range)  morphine 4 MG/ML injection 4 mg (4 mg Intravenous Given 08/05/19 2058)  iohexol (OMNIPAQUE) 350 MG/ML injection 80 mL (80 mLs Intravenous Contrast Given 08/05/19 2137)  ondansetron (ZOFRAN) injection 4 mg (4 mg Intravenous Given 08/05/19 2131)     Initial Impression / Assessment and Plan / ED Course  I have reviewed the triage vital signs and the nursing notes.  Pertinent labs & imaging results that were available during my care of the  patient were reviewed by me and considered in my medical decision making (see chart for details).  Clinical Course as of Aug 04 2242  Sat Aug 05, 2019  2219 Received call from radiologist, patient has acute thromboembolic lesion in his SMA, will initiate heparin therapy, consult vascular surgery to further discuss, will update patient and son   [RD]  2230 Discussed with Dr. Scot Dock with vascular, recommends ER to ER transfer   [RD]  2240 Discussed with Dr. Ralene Bathe at [facility], Will accept ER to ER   [RD]    Clinical Course User Index [RD] Lucrezia Starch, MD       85 year old male with history of A. fib presents to ER with severe abdominal pain after meal.  Here patient was actually quite well-appearing, had a soft abdomen.  Given pain out of proportion to exam and his history atrial fibrillation, CT angio was obtained to evaluate for mesenteric ischemia. CT demonstrated acute thromboembolic lesion in his SMA.  Discussed case with vascular surgery on-call, given patient's already therapeutic on Coumadin, likely only option will be surgical.  Recommended ER to ER transfer.  Discussed with Dr. Ralene Bathe at [facility] who accepts ER to ER.  Obtained rapid Covid, check lactic acid, consult pharmacy for heparin dosing.  Updated patient as well as son on the results of the CT scan and plan for transfer to [facility] for surgical evaluation.  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

## 2019-08-05 NOTE — ED Triage Notes (Signed)
Pt arrives via EMS from home after developing abd pain after eating [employer]. One episode of diarrhea and one episode of vomiting. generalized abd pain. Took tylenol PTA with no relief. Hx HOH, AFIB managed with coumadin.

## 2019-08-05 NOTE — Progress Notes (Signed)
ANTICOAGULATION CONSULT NOTE - Initial Consult  Pharmacy Consult for heparin Indication: SMA thrombosis  Allergies  Allergen Reactions  . Flomax [Tamsulosin Hcl] Other (See Comments)    Can't pee    Patient Measurements: Height: 6\' 1"  (185.4 cm) Weight: 220 lb (99.8 kg) IBW/kg (Calculated) : 79.9 Heparin Dosing Weight: 99.8 kg  Vital Signs: Temp: 98 F (36.7 C) (10/31 1958) Temp Source: Oral (10/31 1958) BP: 154/94 (10/31 1958) Pulse Rate: 107 (10/31 1958)  Labs: Recent Labs    08/05/19 2001  HGB 14.4  HCT 44.7  PLT 156  LABPROT 22.5*  INR 2.0*  CREATININE 1.53*    Estimated Creatinine Clearance: 41.5 mL/min (A) (by C-G formula based on SCr of 1.53 mg/dL (H)).   Medical History: Past Medical History:  Diagnosis Date  . AAA (abdominal aortic aneurysm) ([place])    Possibly an erroneous entry: only imaging i see is 2017 and specifically shows "no aneurysmal dilation" of abdominal aorta  . Aortic stenosis    a. mild-mod by echo 06/2016.  Marland Kitchen Atrial fibrillation ([place])   . CKD (chronic kidney disease), stage III    baseline Cr 1.4-1.5 in 2016-2017 by PCP)  . CVA (cerebral infarction)   . Depression   . DVT (deep venous thrombosis) ([place]) 2006  . Dyslipidemia   . Hematuria   . HOH (hard of hearing)   . Hypercholesterolemia   . OSA (obstructive sleep apnea)    intol to CPAP  . Permanent atrial fibrillation ([place])   . Prostate enlargement   . Pulmonary nodules   . Thoracic ascending aortic aneurysm ([place])    4.5cm in 11/2018, stable since 2016    Medications:  Coumadin PTA  Assessment: 85 yo with acute thromboembolic lesion in his SMA to start heparin therapy.  He was on coumadin PTA and INR 2.0 therefore will not bolus.  Hg 14.4, PTLC 156 Goal of Therapy:  Heparin level 0.3-0.7 units/ml Monitor platelets by anticoagulation protocol: Yes   Plan:  Start heparin drip at 1500 units/hr Check heparin level 6-8 hours after start Daily HL ,PT/INR and CBC  daily Monitor for bleeding complications  Voncille Simm Poteet 08/05/2019,10:25 PM

## 2019-08-05 NOTE — ED Notes (Signed)
CT notified RN that pt vomited into his mask. Pt given new mask, orders obtained for antiemetic.

## 2019-08-06 ENCOUNTER — Emergency Department (HOSPITAL_COMMUNITY): Payer: Medicare Other | Admitting: Anesthesiology

## 2019-08-06 ENCOUNTER — Encounter (HOSPITAL_COMMUNITY)
Admission: EM | Disposition: A | Discharge status: Home Health | Payer: Self-pay | Source: Home / Self Care | Attending: Vascular Surgery

## 2019-08-06 ENCOUNTER — Inpatient Hospital Stay (HOSPITAL_COMMUNITY): Payer: Medicare Other

## 2019-08-06 ENCOUNTER — Encounter (HOSPITAL_COMMUNITY): Payer: Self-pay | Admitting: Cardiology

## 2019-08-06 DIAGNOSIS — I5033 Acute on chronic diastolic (congestive) heart failure: Secondary | ICD-10-CM | POA: Diagnosis not present

## 2019-08-06 DIAGNOSIS — I700 Atherosclerosis of aorta: Secondary | ICD-10-CM | POA: Diagnosis present

## 2019-08-06 DIAGNOSIS — Z4682 Encounter for fitting and adjustment of non-vascular catheter: Secondary | ICD-10-CM | POA: Diagnosis not present

## 2019-08-06 DIAGNOSIS — I4821 Permanent atrial fibrillation: Secondary | ICD-10-CM | POA: Diagnosis present

## 2019-08-06 DIAGNOSIS — R109 Unspecified abdominal pain: Secondary | ICD-10-CM | POA: Diagnosis not present

## 2019-08-06 DIAGNOSIS — I714 Abdominal aortic aneurysm, without rupture: Secondary | ICD-10-CM | POA: Diagnosis not present

## 2019-08-06 DIAGNOSIS — K55059 Acute (reversible) ischemia of intestine, part and extent unspecified: Secondary | ICD-10-CM | POA: Diagnosis not present

## 2019-08-06 DIAGNOSIS — Z8673 Personal history of transient ischemic attack (TIA), and cerebral infarction without residual deficits: Secondary | ICD-10-CM | POA: Diagnosis not present

## 2019-08-06 DIAGNOSIS — I083 Combined rheumatic disorders of mitral, aortic and tricuspid valves: Secondary | ICD-10-CM | POA: Diagnosis present

## 2019-08-06 DIAGNOSIS — Z7901 Long term (current) use of anticoagulants: Secondary | ICD-10-CM | POA: Diagnosis not present

## 2019-08-06 DIAGNOSIS — N400 Enlarged prostate without lower urinary tract symptoms: Secondary | ICD-10-CM | POA: Diagnosis present

## 2019-08-06 DIAGNOSIS — Z87891 Personal history of nicotine dependence: Secondary | ICD-10-CM | POA: Diagnosis not present

## 2019-08-06 DIAGNOSIS — N1832 Chronic kidney disease, stage 3b: Secondary | ICD-10-CM | POA: Diagnosis present

## 2019-08-06 DIAGNOSIS — E8770 Fluid overload, unspecified: Secondary | ICD-10-CM | POA: Diagnosis not present

## 2019-08-06 DIAGNOSIS — I739 Peripheral vascular disease, unspecified: Secondary | ICD-10-CM | POA: Diagnosis present

## 2019-08-06 DIAGNOSIS — N183 Chronic kidney disease, stage 3 unspecified: Secondary | ICD-10-CM | POA: Diagnosis not present

## 2019-08-06 DIAGNOSIS — J9811 Atelectasis: Secondary | ICD-10-CM | POA: Diagnosis not present

## 2019-08-06 DIAGNOSIS — I472 Ventricular tachycardia: Secondary | ICD-10-CM | POA: Diagnosis not present

## 2019-08-06 DIAGNOSIS — Z86718 Personal history of other venous thrombosis and embolism: Secondary | ICD-10-CM | POA: Diagnosis not present

## 2019-08-06 DIAGNOSIS — E785 Hyperlipidemia, unspecified: Secondary | ICD-10-CM | POA: Diagnosis present

## 2019-08-06 DIAGNOSIS — D62 Acute posthemorrhagic anemia: Secondary | ICD-10-CM | POA: Diagnosis not present

## 2019-08-06 DIAGNOSIS — I712 Thoracic aortic aneurysm, without rupture: Secondary | ICD-10-CM | POA: Diagnosis present

## 2019-08-06 DIAGNOSIS — Z8249 Family history of ischemic heart disease and other diseases of the circulatory system: Secondary | ICD-10-CM | POA: Diagnosis not present

## 2019-08-06 DIAGNOSIS — K55069 Acute infarction of intestine, part and extent unspecified: Secondary | ICD-10-CM | POA: Diagnosis not present

## 2019-08-06 DIAGNOSIS — J449 Chronic obstructive pulmonary disease, unspecified: Secondary | ICD-10-CM | POA: Diagnosis not present

## 2019-08-06 DIAGNOSIS — K567 Ileus, unspecified: Secondary | ICD-10-CM | POA: Diagnosis not present

## 2019-08-06 DIAGNOSIS — Z974 Presence of external hearing-aid: Secondary | ICD-10-CM | POA: Diagnosis not present

## 2019-08-06 DIAGNOSIS — I350 Nonrheumatic aortic (valve) stenosis: Secondary | ICD-10-CM | POA: Diagnosis not present

## 2019-08-06 DIAGNOSIS — G4733 Obstructive sleep apnea (adult) (pediatric): Secondary | ICD-10-CM | POA: Diagnosis present

## 2019-08-06 DIAGNOSIS — K55049 Acute infarction of large intestine, extent unspecified: Secondary | ICD-10-CM | POA: Diagnosis present

## 2019-08-06 DIAGNOSIS — Z20828 Contact with and (suspected) exposure to other viral communicable diseases: Secondary | ICD-10-CM | POA: Diagnosis present

## 2019-08-06 DIAGNOSIS — R791 Abnormal coagulation profile: Secondary | ICD-10-CM | POA: Diagnosis not present

## 2019-08-06 DIAGNOSIS — H9193 Unspecified hearing loss, bilateral: Secondary | ICD-10-CM | POA: Diagnosis present

## 2019-08-06 DIAGNOSIS — I749 Embolism and thrombosis of unspecified artery: Secondary | ICD-10-CM | POA: Diagnosis not present

## 2019-08-06 DIAGNOSIS — Z823 Family history of stroke: Secondary | ICD-10-CM | POA: Diagnosis not present

## 2019-08-06 HISTORY — PX: PATCH ANGIOPLASTY: SHX6230

## 2019-08-06 HISTORY — PX: MESENTERIC ARTERY BYPASS: SHX5968

## 2019-08-06 LAB — POCT I-STAT 7, (LYTES, BLD GAS, ICA,H+H)
Acid-base deficit: 7 mmol/L — ABNORMAL HIGH (ref 0.0–2.0)
Bicarbonate: 19.3 mmol/L — ABNORMAL LOW (ref 20.0–28.0)
Calcium, Ion: 1.11 mmol/L — ABNORMAL LOW (ref 1.15–1.40)
HCT: 36 % — ABNORMAL LOW (ref 39.0–52.0)
Hemoglobin: 12.2 g/dL — ABNORMAL LOW (ref 13.0–17.0)
O2 Saturation: 89 %
Potassium: 4.2 mmol/L (ref 3.5–5.1)
Sodium: 141 mmol/L (ref 135–145)
TCO2: 20 mmol/L — ABNORMAL LOW (ref 22–32)
pCO2 arterial: 39.2 mmHg (ref 32.0–48.0)
pH, Arterial: 7.301 — ABNORMAL LOW (ref 7.350–7.450)
pO2, Arterial: 62 mmHg — ABNORMAL LOW (ref 83.0–108.0)

## 2019-08-06 LAB — COMPREHENSIVE METABOLIC PANEL
ALT: 24 U/L (ref 0–44)
AST: 29 U/L (ref 15–41)
Albumin: 2.8 g/dL — ABNORMAL LOW (ref 3.5–5.0)
Alkaline Phosphatase: 46 U/L (ref 38–126)
Anion gap: 12 (ref 5–15)
BUN: 21 mg/dL (ref 8–23)
CO2: 19 mmol/L — ABNORMAL LOW (ref 22–32)
Calcium: 7.9 mg/dL — ABNORMAL LOW (ref 8.9–10.3)
Chloride: 108 mmol/L (ref 98–111)
Creatinine, Ser: 1.36 mg/dL — ABNORMAL HIGH (ref 0.61–1.24)
GFR calc Af Amer: 53 mL/min — ABNORMAL LOW (ref >60)
GFR calc non Af Amer: 46 mL/min — ABNORMAL LOW (ref >60)
Glucose, Bld: 205 mg/dL — ABNORMAL HIGH (ref 70–99)
Potassium: 4.1 mmol/L (ref 3.5–5.1)
Sodium: 139 mmol/L (ref 135–145)
Total Bilirubin: 0.8 mg/dL (ref 0.3–1.2)
Total Protein: 5.7 g/dL — ABNORMAL LOW (ref 6.5–8.1)

## 2019-08-06 LAB — PROTIME-INR
INR: 1.2 (ref 0.8–1.2)
Prothrombin Time: 15.3 seconds — ABNORMAL HIGH (ref 11.4–15.2)

## 2019-08-06 LAB — CBC
HCT: 39.3 % (ref 39.0–52.0)
HCT: 38.3 % — ABNORMAL LOW (ref 39.0–52.0)
Hemoglobin: 13.1 g/dL (ref 13.0–17.0)
Hemoglobin: 12.7 g/dL — ABNORMAL LOW (ref 13.0–17.0)
MCH: 32.3 pg (ref 26.0–34.0)
MCH: 32.2 pg (ref 26.0–34.0)
MCHC: 33.3 g/dL (ref 30.0–36.0)
MCHC: 33.2 g/dL (ref 30.0–36.0)
MCV: 97 fL (ref 80.0–100.0)
MCV: 97 fL (ref 80.0–100.0)
Platelets: 149 10*3/uL — ABNORMAL LOW (ref 150–400)
Platelets: 146 10*3/uL — ABNORMAL LOW (ref 150–400)
RBC: 4.05 MIL/uL — ABNORMAL LOW (ref 4.22–5.81)
RBC: 3.95 MIL/uL — ABNORMAL LOW (ref 4.22–5.81)
RDW: 13 % (ref 11.5–15.5)
RDW: 13.3 % (ref 11.5–15.5)
WBC: 17.3 10*3/uL — ABNORMAL HIGH (ref 4.0–10.5)
WBC: 17.5 10*3/uL — ABNORMAL HIGH (ref 4.0–10.5)
nRBC: 0 % (ref 0.0–0.2)
nRBC: 0 % (ref 0.0–0.2)

## 2019-08-06 LAB — ABO/RH: ABO/RH(D): A POS

## 2019-08-06 LAB — APTT: aPTT: 32 seconds (ref 24–36)

## 2019-08-06 LAB — HEPARIN LEVEL (UNFRACTIONATED)
Heparin Unfractionated: 1.12 IU/mL — ABNORMAL HIGH (ref 0.30–0.70)
Heparin Unfractionated: 1.18 IU/mL — ABNORMAL HIGH (ref 0.30–0.70)

## 2019-08-06 LAB — PREPARE RBC (CROSSMATCH)

## 2019-08-06 LAB — MRSA PCR SCREENING: MRSA by PCR: NEGATIVE

## 2019-08-06 LAB — MAGNESIUM: Magnesium: 1.7 mg/dL (ref 1.7–2.4)

## 2019-08-06 LAB — LACTIC ACID, PLASMA: Lactic Acid, Venous: 1.5 mmol/L (ref 0.5–1.9)

## 2019-08-06 LAB — AMYLASE: Amylase: 72 U/L (ref 28–100)

## 2019-08-06 SURGERY — CREATION, BYPASS, ARTERIAL, MESENTERIC
Anesthesia: General | Site: Abdomen

## 2019-08-06 MED ORDER — LIDOCAINE HCL (CARDIAC) PF 100 MG/5ML IV SOSY
PREFILLED_SYRINGE | INTRAVENOUS | Status: DC | PRN
  Administered 2019-08-06: 40 mg via INTRATRACHEAL

## 2019-08-06 MED ORDER — DEXAMETHASONE SODIUM PHOSPHATE 10 MG/ML IJ SOLN
INTRAMUSCULAR | Status: AC
  Filled 2019-08-06: qty 2

## 2019-08-06 MED ORDER — PHENYLEPHRINE 40 MCG/ML (10ML) SYRINGE FOR IV PUSH (FOR BLOOD PRESSURE SUPPORT)
PREFILLED_SYRINGE | INTRAVENOUS | Status: AC
  Filled 2019-08-06: qty 10

## 2019-08-06 MED ORDER — HEMOSTATIC AGENTS (NO CHARGE) OPTIME
TOPICAL | Status: DC | PRN
  Administered 2019-08-06: 1 via TOPICAL

## 2019-08-06 MED ORDER — CHLORHEXIDINE GLUCONATE CLOTH 2 % EX PADS
6.0000 | MEDICATED_PAD | Freq: Every day | CUTANEOUS | Status: DC
  Administered 2019-08-06 – 2019-08-13 (×8): 6 via TOPICAL

## 2019-08-06 MED ORDER — DILTIAZEM HCL-DEXTROSE 125-5 MG/125ML-% IV SOLN (PREMIX)
5.0000 mg/h | INTRAVENOUS | Status: DC
  Administered 2019-08-06: 5 mg via INTRAVENOUS
  Administered 2019-08-06: 5 mg/h via INTRAVENOUS
  Administered 2019-08-06: 7.5 mg via INTRAVENOUS
  Administered 2019-08-06: 5 mg via INTRAVENOUS
  Filled 2019-08-06 (×3): qty 125

## 2019-08-06 MED ORDER — FENTANYL CITRATE (PF) 250 MCG/5ML IJ SOLN
INTRAMUSCULAR | Status: DC | PRN
  Administered 2019-08-06: 50 ug via INTRAVENOUS

## 2019-08-06 MED ORDER — ALUM & MAG HYDROXIDE-SIMETH 200-200-20 MG/5ML PO SUSP: 15.0000 mL | ORAL | Status: DC | PRN

## 2019-08-06 MED ORDER — SODIUM CHLORIDE 0.9 % IR SOLN
Status: DC | PRN
  Administered 2019-08-06: 2000 mL

## 2019-08-06 MED ORDER — PROTHROMBIN COMPLEX CONC HUMAN 500 UNITS IV KIT
2756.0000 [IU] | PACK | Status: AC
  Administered 2019-08-06: 2756 [IU] via INTRAVENOUS
  Filled 2019-08-06: qty 2756

## 2019-08-06 MED ORDER — HEPARIN (PORCINE) 25000 UT/250ML-% IV SOLN
900.0000 [IU]/h | INTRAVENOUS | Status: DC
  Administered 2019-08-07: 1100 [IU]/h via INTRAVENOUS
  Administered 2019-08-08: 850 [IU]/h via INTRAVENOUS
  Administered 2019-08-09: 900 [IU]/h via INTRAVENOUS
  Filled 2019-08-06 (×2): qty 250

## 2019-08-06 MED ORDER — SODIUM CHLORIDE 0.9 % IV SOLN: 500.0000 mL | Freq: Once | INTRAVENOUS | Status: DC | PRN

## 2019-08-06 MED ORDER — PHENYLEPHRINE HCL-NACL 10-0.9 MG/250ML-% IV SOLN
INTRAVENOUS | Status: DC | PRN
  Administered 2019-08-06: 50 ug/min via INTRAVENOUS

## 2019-08-06 MED ORDER — CEFAZOLIN SODIUM 1 G IJ SOLR
INTRAMUSCULAR | Status: AC
  Filled 2019-08-06: qty 20

## 2019-08-06 MED ORDER — SODIUM CHLORIDE 0.9 % IV SOLN
INTRAVENOUS | Status: DC | PRN
  Administered 2019-08-06: 500 mL

## 2019-08-06 MED ORDER — SODIUM CHLORIDE 0.9 % IV SOLN
1.0000 g | Freq: Two times a day (BID) | INTRAVENOUS | Status: DC
  Administered 2019-08-06 – 2019-08-11 (×11): 1 g via INTRAVENOUS
  Filled 2019-08-06 (×14): qty 1

## 2019-08-06 MED ORDER — SODIUM CHLORIDE 0.9 % IV SOLN
INTRAVENOUS | Status: DC | PRN
  Administered 2019-08-06: 2 g via INTRAVENOUS

## 2019-08-06 MED ORDER — OXYCODONE-ACETAMINOPHEN 5-325 MG PO TABS
1.0000 | ORAL_TABLET | ORAL | Status: DC | PRN
  Administered 2019-08-08 – 2019-08-09 (×2): 2 via ORAL
  Filled 2019-08-06 (×2): qty 2

## 2019-08-06 MED ORDER — MORPHINE SULFATE (PF) 4 MG/ML IV SOLN
4.0000 mg | Freq: Once | INTRAVENOUS | Status: AC
  Administered 2019-08-06: 4 mg via INTRAVENOUS
  Filled 2019-08-06: qty 1

## 2019-08-06 MED ORDER — GLYCOPYRROLATE PF 0.2 MG/ML IJ SOSY
PREFILLED_SYRINGE | INTRAMUSCULAR | Status: AC
  Filled 2019-08-06: qty 1

## 2019-08-06 MED ORDER — SODIUM CHLORIDE 0.9 % IV SOLN
INTRAVENOUS | Status: DC
  Administered 2019-08-06 – 2019-08-08 (×5): via INTRAVENOUS

## 2019-08-06 MED ORDER — SODIUM CHLORIDE 0.9% IV SOLUTION: Freq: Once | INTRAVENOUS | Status: DC

## 2019-08-06 MED ORDER — ONDANSETRON HCL 4 MG/2ML IJ SOLN: 4.0000 mg | Freq: Four times a day (QID) | INTRAMUSCULAR | Status: DC | PRN

## 2019-08-06 MED ORDER — ARTIFICIAL TEARS OPHTHALMIC OINT
TOPICAL_OINTMENT | OPHTHALMIC | Status: AC
  Filled 2019-08-06: qty 7

## 2019-08-06 MED ORDER — ROCURONIUM BROMIDE 10 MG/ML (PF) SYRINGE
PREFILLED_SYRINGE | INTRAVENOUS | Status: AC
  Filled 2019-08-06: qty 30

## 2019-08-06 MED ORDER — ACETAMINOPHEN 325 MG RE SUPP: 325.0000 mg | RECTAL | Status: DC | PRN

## 2019-08-06 MED ORDER — PHENOL 1.4 % MT LIQD: 1.0000 | OROMUCOSAL | Status: DC | PRN

## 2019-08-06 MED ORDER — MORPHINE SULFATE (PF) 2 MG/ML IV SOLN
2.0000 mg | INTRAVENOUS | Status: DC | PRN
  Administered 2019-08-06 – 2019-08-07 (×6): 2 mg via INTRAVENOUS
  Filled 2019-08-06 (×6): qty 1

## 2019-08-06 MED ORDER — LABETALOL HCL 5 MG/ML IV SOLN: 10.0000 mg | INTRAVENOUS | Status: DC | PRN

## 2019-08-06 MED ORDER — PROPOFOL 10 MG/ML IV BOLUS
INTRAVENOUS | Status: DC | PRN
  Administered 2019-08-06: 90 mg via INTRAVENOUS

## 2019-08-06 MED ORDER — PHENYLEPHRINE 40 MCG/ML (10ML) SYRINGE FOR IV PUSH (FOR BLOOD PRESSURE SUPPORT)
PREFILLED_SYRINGE | INTRAVENOUS | Status: DC | PRN
  Administered 2019-08-06 (×4): 80 ug via INTRAVENOUS

## 2019-08-06 MED ORDER — SUCCINYLCHOLINE CHLORIDE 200 MG/10ML IV SOSY
PREFILLED_SYRINGE | INTRAVENOUS | Status: AC
  Filled 2019-08-06: qty 30

## 2019-08-06 MED ORDER — CEFAZOLIN SODIUM-DEXTROSE 2-4 GM/100ML-% IV SOLN
2.0000 g | Freq: Three times a day (TID) | INTRAVENOUS | Status: DC
  Administered 2019-08-06: 2 g via INTRAVENOUS
  Filled 2019-08-06 (×2): qty 100

## 2019-08-06 MED ORDER — SODIUM CHLORIDE 0.9 % IV SOLN
INTRAVENOUS | Status: AC
  Filled 2019-08-06: qty 1.2

## 2019-08-06 MED ORDER — PANTOPRAZOLE SODIUM 40 MG PO TBEC: 40.0000 mg | DELAYED_RELEASE_TABLET | Freq: Every day | ORAL | Status: DC

## 2019-08-06 MED ORDER — DOCUSATE SODIUM 100 MG PO CAPS: 100.0000 mg | ORAL_CAPSULE | Freq: Every day | ORAL | Status: DC

## 2019-08-06 MED ORDER — GUAIFENESIN-DM 100-10 MG/5ML PO SYRP: 15.0000 mL | ORAL_SOLUTION | ORAL | Status: DC | PRN

## 2019-08-06 MED ORDER — DILTIAZEM HCL-DEXTROSE 125-5 MG/125ML-% IV SOLN (PREMIX): 5.0000 mg/h | INTRAVENOUS | Status: DC

## 2019-08-06 MED ORDER — ROCURONIUM BROMIDE 100 MG/10ML IV SOLN
INTRAVENOUS | Status: DC | PRN
  Administered 2019-08-06 (×2): 40 mg via INTRAVENOUS
  Administered 2019-08-06: 20 mg via INTRAVENOUS

## 2019-08-06 MED ORDER — ACETAMINOPHEN 325 MG PO TABS: 325.0000 mg | ORAL_TABLET | ORAL | Status: DC | PRN

## 2019-08-06 MED ORDER — SUGAMMADEX SODIUM 500 MG/5ML IV SOLN
INTRAVENOUS | Status: DC | PRN
  Administered 2019-08-06: 400 mg via INTRAVENOUS

## 2019-08-06 MED ORDER — METOPROLOL TARTRATE 5 MG/5ML IV SOLN
2.0000 mg | INTRAVENOUS | Status: DC | PRN
  Administered 2019-08-07: 5 mg via INTRAVENOUS
  Filled 2019-08-06: qty 5

## 2019-08-06 MED ORDER — SODIUM CHLORIDE 0.9 % IV SOLN
INTRAVENOUS | Status: AC
  Filled 2019-08-06: qty 2

## 2019-08-06 MED ORDER — SODIUM CHLORIDE 0.9 % IV SOLN
INTRAVENOUS | Status: DC | PRN
  Administered 2019-08-06 (×2): via INTRAVENOUS

## 2019-08-06 MED ORDER — PROTAMINE SULFATE 10 MG/ML IV SOLN
INTRAVENOUS | Status: DC | PRN
  Administered 2019-08-06: 40 mg via INTRAVENOUS

## 2019-08-06 MED ORDER — HEPARIN SODIUM (PORCINE) 1000 UNIT/ML IJ SOLN
INTRAMUSCULAR | Status: DC | PRN
  Administered 2019-08-06: 8000 [IU] via INTRAVENOUS

## 2019-08-06 MED ORDER — SUCCINYLCHOLINE CHLORIDE 20 MG/ML IJ SOLN
INTRAMUSCULAR | Status: DC | PRN
  Administered 2019-08-06: 120 mg via INTRAVENOUS

## 2019-08-06 MED ORDER — POTASSIUM CHLORIDE CRYS ER 20 MEQ PO TBCR: 20.0000 meq | EXTENDED_RELEASE_TABLET | Freq: Every day | ORAL | Status: DC | PRN

## 2019-08-06 MED ORDER — HYDRALAZINE HCL 20 MG/ML IJ SOLN: 5.0000 mg | INTRAMUSCULAR | Status: DC | PRN

## 2019-08-06 MED ORDER — MAGNESIUM SULFATE 2 GM/50ML IV SOLN: 2.0000 g | Freq: Every day | INTRAVENOUS | Status: DC | PRN

## 2019-08-06 MED ORDER — EPHEDRINE 5 MG/ML INJ
INTRAVENOUS | Status: AC
  Filled 2019-08-06: qty 10

## 2019-08-06 MED ORDER — HEPARIN (PORCINE) 25000 UT/250ML-% IV SOLN
1500.0000 [IU]/h | INTRAVENOUS | Status: DC
  Administered 2019-08-06: 1500 [IU]/h via INTRAVENOUS
  Filled 2019-08-06: qty 250

## 2019-08-06 SURGICAL SUPPLY — 53 items
CANISTER SUCT 3000ML PPV (MISCELLANEOUS) ×4 IMPLANT
CANNULA VESSEL 3MM 2 BLNT TIP (CANNULA) ×8 IMPLANT
CATH EMB 3FR 80CM (CATHETERS) ×4 IMPLANT
CATH EMB 4FR 80CM (CATHETERS) ×4 IMPLANT
CLIP VESOCCLUDE MED 24/CT (CLIP) ×4 IMPLANT
CLIP VESOCCLUDE SM WIDE 24/CT (CLIP) ×4 IMPLANT
COVER WAND RF STERILE (DRAPES) ×4 IMPLANT
DERMABOND ADVANCED (GAUZE/BANDAGES/DRESSINGS) ×6
DERMABOND ADVANCED .7 DNX12 (GAUZE/BANDAGES/DRESSINGS) ×6 IMPLANT
ELECT BLADE 4.0 EZ CLEAN MEGAD (MISCELLANEOUS) ×4
ELECT BLADE 6.5 EXT (BLADE) IMPLANT
ELECT REM PT RETURN 9FT ADLT (ELECTROSURGICAL) ×4
ELECTRODE BLDE 4.0 EZ CLN MEGD (MISCELLANEOUS) ×2 IMPLANT
ELECTRODE REM PT RTRN 9FT ADLT (ELECTROSURGICAL) ×2 IMPLANT
FELT TEFLON 1X6 (MISCELLANEOUS) IMPLANT
GLOVE BIO SURGEON STRL SZ7.5 (GLOVE) ×8 IMPLANT
GLOVE BIOGEL PI IND STRL 7.0 (GLOVE) ×6 IMPLANT
GLOVE BIOGEL PI IND STRL 7.5 (GLOVE) ×2 IMPLANT
GLOVE BIOGEL PI IND STRL 8 (GLOVE) ×2 IMPLANT
GLOVE BIOGEL PI INDICATOR 7.0 (GLOVE) ×6
GLOVE BIOGEL PI INDICATOR 7.5 (GLOVE) ×2
GLOVE BIOGEL PI INDICATOR 8 (GLOVE) ×2
GOWN STRL REUS W/ TWL LRG LVL3 (GOWN DISPOSABLE) ×6 IMPLANT
GOWN STRL REUS W/TWL LRG LVL3 (GOWN DISPOSABLE) ×6
HEMOSTAT SNOW SURGICEL 2X4 (HEMOSTASIS) ×4 IMPLANT
INSERT FOGARTY 61MM (MISCELLANEOUS) IMPLANT
INSERT FOGARTY SM (MISCELLANEOUS) IMPLANT
KIT BASIN OR (CUSTOM PROCEDURE TRAY) ×4 IMPLANT
KIT TURNOVER KIT B (KITS) ×4 IMPLANT
NS IRRIG 1000ML POUR BTL (IV SOLUTION) ×8 IMPLANT
PACK AORTA (CUSTOM PROCEDURE TRAY) ×4 IMPLANT
PAD ARMBOARD 7.5X6 YLW CONV (MISCELLANEOUS) ×8 IMPLANT
RETAINER VISCERA MED (MISCELLANEOUS) IMPLANT
SPONGE SURGIFOAM ABS GEL 100 (HEMOSTASIS) IMPLANT
STAPLER VISISTAT (STAPLE) ×8 IMPLANT
SUT ETHIBOND 5 LR DA (SUTURE) IMPLANT
SUT PDS AB 1 TP1 54 (SUTURE) ×8 IMPLANT
SUT PROLENE 2 0 MH 48 (SUTURE) IMPLANT
SUT PROLENE 3 0 SH 48 (SUTURE) IMPLANT
SUT PROLENE 3 0 SH1 36 (SUTURE) IMPLANT
SUT PROLENE 5 0 C 1 24 (SUTURE) ×8 IMPLANT
SUT PROLENE 5 0 C 1 36 (SUTURE) IMPLANT
SUT SILK 2 0 SH CR/8 (SUTURE) IMPLANT
SUT VIC AB 2-0 CTB1 (SUTURE) IMPLANT
SUT VIC AB 3-0 SH 27 (SUTURE) ×10
SUT VIC AB 3-0 SH 27X BRD (SUTURE) ×10 IMPLANT
SUT VIC AB 4-0 PS2 18 (SUTURE) ×8 IMPLANT
SYRINGE 1CC SLIP TB (MISCELLANEOUS) ×4 IMPLANT
SYRINGE 3CC LL L/F (MISCELLANEOUS) ×4 IMPLANT
TOWEL GREEN STERILE (TOWEL DISPOSABLE) ×4 IMPLANT
TOWEL SURG RFD BLUE STRL DISP (DISPOSABLE) IMPLANT
TRAY FOLEY MTR SLVR 16FR STAT (SET/KITS/TRAYS/PACK) ×4 IMPLANT
WATER STERILE IRR 1000ML POUR (IV SOLUTION) ×4 IMPLANT

## 2019-08-06 NOTE — Progress Notes (Signed)
ANTICOAGULATION CONSULT NOTE - Initial Consult  Pharmacy Consult for heparin Indication: SMA thrombosis  Allergies  Allergen Reactions  . Flomax [Tamsulosin Hcl] Other (See Comments)    Can't pee    Patient Measurements: Height: 6\' 1"  (185.4 cm) Weight: 220 lb (99.8 kg) IBW/kg (Calculated) : 79.9  Vital Signs: Temp: 97.3 F (36.3 C) (11/01 0011) Temp Source: Oral (11/01 0011) BP: 192/94 (11/01 0012) Pulse Rate: 122 (11/01 0012)  Labs: Recent Labs    08/05/19 2001  HGB 14.4  HCT 44.7  PLT 156  LABPROT 22.5*  INR 2.0*  CREATININE 1.53*    Estimated Creatinine Clearance: 41.5 mL/min (A) (by C-G formula based on SCr of 1.53 mg/dL (H)).   Medical History: Past Medical History:  Diagnosis Date  . AAA (abdominal aortic aneurysm) ([place])    Possibly an erroneous entry: only imaging i see is 2017 and specifically shows "no aneurysmal dilation" of abdominal aorta  . Aortic stenosis    a. mild-mod by echo 06/2016.  Marland Kitchen Atrial fibrillation ([place])   . CKD (chronic kidney disease), stage III    baseline Cr 1.4-1.5 in 2016-2017 by PCP)  . CVA (cerebral infarction)   . Depression   . DVT (deep venous thrombosis) ([place]) 2006  . Dyslipidemia   . Hematuria   . HOH (hard of hearing)   . Hypercholesterolemia   . OSA (obstructive sleep apnea)    intol to CPAP  . Permanent atrial fibrillation ([place])   . Prostate enlargement   . Pulmonary nodules   . Thoracic ascending aortic aneurysm ([place])    4.5cm in 11/2018, stable since 2016    Assessment: 85yo male on Coumadin PTA for Afib, presented w/ acute thromboembolic lesion to SMA despite INR of 2.0 >> started on heparin in ED then sent urgently to OR, now s/p thrombectomy (pt received Kcentra perioperatively), to resume heparin at 0900.  Goal of Therapy:  Heparin level 0.3-0.7 units/ml Monitor platelets by anticoagulation protocol: Yes   Plan:  At 0900 will start heparin gtt at 1500 units/hr and monitor heparin levels and  CBC.  Wynona Neat, PharmD, BCPS  08/06/2019,4:06 AM

## 2019-08-06 NOTE — Anesthesia Preprocedure Evaluation (Signed)
Anesthesia Evaluation  Patient identified by MRN, date of birth, ID band Patient awake    Reviewed: Allergy & Precautions, NPO status , Patient's Chart, lab work & pertinent test results  Airway Mallampati: II  TM Distance: >3 FB Neck ROM: Full    Dental  (+) Edentulous Upper   Pulmonary former smoker,    breath sounds clear to auscultation       Cardiovascular  Rhythm:Irregular Rate:Tachycardia     Neuro/Psych    GI/Hepatic   Endo/Other    Renal/GU      Musculoskeletal   Abdominal   Peds  Hematology   Anesthesia Other Findings   Reproductive/Obstetrics                             Anesthesia Physical Anesthesia Plan  ASA: IV  Anesthesia Plan: General   Post-op Pain Management:    Induction: Intravenous  PONV Risk Score and Plan: Ondansetron  Airway Management Planned: Oral ETT  Additional Equipment: Arterial line, CVP and Ultrasound Guidance Line Placement  Intra-op Plan:   Post-operative Plan: Extubation in OR  Informed Consent: I have reviewed the patients History and Physical, chart, labs and discussed the procedure including the risks, benefits and alternatives for the proposed anesthesia with the patient or authorized representative who has indicated his/her understanding and acceptance.       Plan Discussed with: CRNA and Anesthesiologist  Anesthesia Plan Comments:         Anesthesia Quick Evaluation

## 2019-08-06 NOTE — ED Notes (Signed)
Dr. Dickson at bedside.

## 2019-08-06 NOTE — Progress Notes (Signed)
   VASCULAR SURGERY ASSESSMENT & PLAN:   Day of Surgery s/p: SMA EMBOLECTOMY WITH VEIN PATCH ANGIOPLASTY: He states that his abdominal pain feels better.  I will check a lactic acid later today.  We will follow his exam closely.   CARDIAC: He has atrial fibrillation and the rate is now well controlled on Cardizem.  An echo has been ordered to rule out a cardiac source of embolization.  He had an embolic event despite therapeutic Coumadin (INR 2.0).  I will consult cardiology for further recommendations on anticoagulation and management of his atrial fibrillation.  ANTICOAGULATION: Full dose heparin to begin now.  GI/NUTRITION: Leave NG tube for now.  ID: Continue cefotetan for now.   SUBJECTIVE:   His abdomen feels better.  PHYSICAL EXAM:   Vitals:   08/06/19 0459 08/06/19 0500 08/06/19 0600 08/06/19 0700  BP:  122/72 107/76 117/68  Pulse:  78 77 87  Resp:  (!) 21 19 (!) 21  Temp: (!) 97.4 F (36.3 C)     TempSrc: Oral     SpO2:  91% 92% 93%  Weight:      Height:       NGT output not recorded.  But it looks like there is minimal in the canister.  ABDOMEN: No bowel sounds.  Minimal abdominal tenderness. LUNGS: Good air exchange bilaterally. VASCULAR: Brisk Doppler signals both feet.  LABS:   Lab Results  Component Value Date   WBC 17.3 (H) 08/06/2019   HGB 12.2 (L) 08/06/2019   HCT 36.0 (L) 08/06/2019   MCV 97.0 08/06/2019   PLT 149 (L) 08/06/2019   Lab Results  Component Value Date   CREATININE 1.36 (H) 08/06/2019   Lab Results  Component Value Date   INR 1.2 08/06/2019    PROBLEM LIST:    Active Problems:   Superior mesenteric artery thrombosis (HCC)   Mesenteric artery thrombosis (HCC)   CURRENT MEDS:   . sodium chloride   Intravenous Once  . Chlorhexidine Gluconate Cloth  6 each Topical Daily  . [START ON 08/07/2019] docusate sodium  100 mg Oral Daily  . pantoprazole  40 mg Oral Daily    [name] Office: [phone] 08/06/2019

## 2019-08-06 NOTE — ED Notes (Signed)
Pt arrives via [facility], heparin infusing @ 1500units/hr. Pt denies pain and nausea.

## 2019-08-06 NOTE — Op Note (Signed)
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 Scot Dock, MD  ASSIST: Arlee Muslim, PA  ANESTHESIA: General  EBL: 100 cc  INDICATIONS:    Cesar [name] is a 85 y.o. male who developed a sudden onset of abdominal pain at approximately 230.  He presented to an outlying emergency department and underwent a CT scan which showed embolus to the superior mesenteric artery.  He was on Coumadin for atrial fibrillation with an INR of 2.0.  He is taken urgently to the operating room for SMA thrombectomy.  FINDINGS:   Large amount of clot was retrieved in the proximal superior mesenteric artery and also distally.  The bowel looked well-perfused at the completion of procedure with good Doppler signals in the mesentery.  TECHNIQUE:   The patient was taken to the operating room and received a general anesthetic.  The abdomen and right groin and proximal thigh were prepped and draped in usual sterile fashion.  The abdomen was entered through a midline incision.  Upon careful exploration the small bowel looked marginally perfused.  The transverse colon was reflected superiorly and the small bowel reflected inferiorly.  The mesentery was divided.  Of note there was significant adipose tissue and lymphatics making dissection somewhat difficult.  However I was able to expose the superior mesenteric artery to the left of the superior mesenteric vein.  This was controlled proximally with a vessel loop.  3 jejunal branches were controlled with red vessel loops.  Distally the estimated control of the vessel loop.  Then made a separate longitudinal incision in the right proximal thigh and the great  saphenous vein was harvested over approximately 10 cm.  This was ligated proximally and distally and opened longitudinally to be used as a vein patch.  Patient was heparinized.  The superior mesenteric artery was controlled proximally distally and the branches were controlled.  A longitudinal arteriotomy was made in the superior mesenteric artery.  There was significant thrombus present here and some plaque.  No thrombus was removed directly.  I then flushed the proximal SMA and there was good inflow.  I did pass a 4 Fogarty catheter one time proximally and there was no clot retrieved.  Distally a 3 Fogarty catheter was passed multiple times until no further clot was retrieved.  A moderate amount of clot was retrieved distally.  There is good backbleeding at this point.  The artery was then irrigated with heparinized saline.  The vein patch was then sewn using continuous 6-0 Prolene suture.  At the completion there was a good Doppler signal in the superior mesenteric artery and also in the mesentery.  The heparin was partially reversed with protamine.  I used some snow as a topical thrombotic agent around the mesenteric artery and this controlled some bleeding which have been present adjacent to one of the branches.  Once good hemostasis was obtained to close the mesentery over the superior mesenteric artery.  The small bowel was run.  It looks well-perfused at this point.  The abdominal contents were returned to their normal position.  The position of the NG tube was checked.  The fascial layer was closed with 2 running PDS  sutures.  The subcutaneous layer was closed with 3-0 Vicryl.  The skin was closed with 4-0 Vicryl.  The vein harvest incision was closed with a deep layer of 3-0 Vicryl and the skin closed with 4-0 Vicryl.  Sterile dressing was applied.  Patient tolerated seizure well was transferred to the recovery room in stable condition.  All needle and sponge counts were correct.  Cesar Mayo,  MD, FACS [facility]  DATE OF DICTATION:   08/06/2019

## 2019-08-06 NOTE — ED Notes (Signed)
Pt transported to OR

## 2019-08-06 NOTE — Anesthesia Procedure Notes (Signed)
Central Venous Catheter Insertion Performed by: Roberts Gaudy, MD, anesthesiologist Start/End11/10/2018 1:35 AM, 08/06/2019 1:45 AM Patient location: OR. Preanesthetic checklist: patient identified, IV checked, site marked, risks and benefits discussed, surgical consent, monitors and equipment checked, pre-op evaluation, timeout performed and anesthesia consent Lidocaine 1% used for infiltration and patient sedated Hand hygiene performed  and maximum sterile barriers used  Catheter size: 8 Fr Total catheter length 16. Central line was placed.Double lumen Procedure performed using ultrasound guided technique. Ultrasound Notes:anatomy identified, needle tip was noted to be adjacent to the nerve/plexus identified, no ultrasound evidence of intravascular and/or intraneural injection and image(s) printed for medical record Attempts: 1 Following insertion, dressing applied, line sutured and Biopatch. Post procedure assessment: blood return through all ports, free fluid flow and no air  Patient tolerated the procedure well with no immediate complications.

## 2019-08-06 NOTE — Anesthesia Postprocedure Evaluation (Signed)
Anesthesia Post Note  Patient: Cesar [name]  Procedure(s) Performed: SUPERIOR MESENTERIC ARTERY THROMBECTOMY (N/A Abdomen) Patch Angioplasty of superior messenteric artery with RIGHT femoral vein     Patient location during evaluation: SICU Anesthesia Type: General Level of consciousness: awake and sedated Pain management: pain level controlled Vital Signs Assessment: post-procedure vital signs reviewed and stable Respiratory status: spontaneous breathing, nonlabored ventilation, respiratory function stable and patient connected to face mask oxygen Cardiovascular status: blood pressure returned to baseline and tachycardic Anesthetic complications: no    Last Vitals:  Vitals:   08/06/19 0011 08/06/19 0012  BP:  (!) 192/94  Pulse:  (!) 122  Resp:  20  Temp: (!) 36.3 C   SpO2:  97%    Last Pain:  Vitals:   08/06/19 0011  TempSrc: Oral  PainSc:                  Cesar [name]

## 2019-08-06 NOTE — Evaluation (Signed)
Physical Therapy Evaluation Patient Details Name: Cesar [name] MRN: [id number] DOB: [birth year] Today's Date: 08/06/2019   History of Present Illness  Pt admit for SUPERIOR MESENTERIC ARTERY THROMBECTOMY and Patch Angioplasty of superior messenteric artery with RIGHT femoral vein.  Post op afib.   Clinical Impression  Pt admitted with above diagnosis. Pt was able to stand and pivot with min assist to chair limited by NG tube being on suction.  Should progress well.   Pt currently with functional limitations due to the deficits listed below (see PT Problem List). Pt will benefit from skilled PT to increase their independence and safety with mobility to allow discharge to the venue listed below.      Follow Up Recommendations Home health PT;Supervision/Assistance - 24 hour    Equipment Recommendations  None recommended by PT    Recommendations for Other Services       Precautions / Restrictions Precautions Precautions: Fall Restrictions Weight Bearing Restrictions: No      Mobility  Bed Mobility Overal bed mobility: Needs Assistance Bed Mobility: Rolling;Sidelying to Sit Rolling: Min guard Sidelying to sit: Min assist       General bed mobility comments: asisst to elevate trunk due to some pain  Transfers Overall transfer level: Needs assistance Equipment used: 2 person hand held assist Transfers: Sit to/from [facility] Sit to Stand: Min guard;Min assist Stand pivot transfers: Min guard;Min assist       General transfer comment: Pt needed steadying assist to come to standing with bil UE steadying.  able to take pivotal steps to chair with UE support.  NG tube connected therefore limited distance to transfer only.   Ambulation/Gait                Stairs            Wheelchair Mobility    Modified Rankin (Stroke Patients Only)       Balance Overall balance assessment: Needs assistance Sitting-balance support: No upper extremity  supported;Feet supported Sitting balance-Leahy Scale: Fair     Standing balance support: Bilateral upper extremity supported;During functional activity Standing balance-Leahy Scale: Poor Standing balance comment: relies on UE support                              Pertinent Vitals/Pain Pain Assessment: Faces Faces Pain Scale: Hurts whole lot Pain Location: abdomen Pain Descriptors / Indicators: Grimacing;Guarding;Sore Pain Intervention(s): Limited activity within patient's tolerance;Monitored during session;Repositioned    Home Living Family/patient expects to be discharged to:: Private residence Living Arrangements: Spouse/significant other;Children(son) Available Help at Discharge: Family;Available 24 hours/day Type of Home: House Home Access: Stairs to enter Entrance Stairs-Rails: Right Entrance Stairs-Number of Steps: 1 Home Layout: One level Home Equipment: Walker - 2 wheels;Bedside commode;Cane - single point;Wheelchair - manual      Prior Function Level of Independence: Needs assistance   Gait / Transfers Assistance Needed: wife supervised walking with pt using cane  ADL's / Homemaking Assistance Needed: wife sponge bathes pt per pt        Hand Dominance   Dominant Hand: Left    Extremity/Trunk Assessment   Upper Extremity Assessment Upper Extremity Assessment: Defer to OT evaluation    Lower Extremity Assessment Lower Extremity Assessment: Generalized weakness    Cervical / Trunk Assessment Cervical / Trunk Assessment: Normal  Communication   Communication: HOH  Cognition Arousal/Alertness: Awake/alert Behavior During Therapy: WFL for tasks assessed/performed Overall Cognitive Status: Impaired/Different from  baseline Area of Impairment: Orientation;Following commands;Problem solving                 Orientation Level: Disoriented to;Situation;Time     Following Commands: Follows one step commands consistently     Problem  Solving: Slow processing;Difficulty sequencing;Requires verbal cues        General Comments      Exercises     Assessment/Plan    PT Assessment Patient needs continued PT services  PT Problem List Decreased activity tolerance;Decreased balance;Decreased mobility;Decreased knowledge of use of DME;Decreased safety awareness;Decreased knowledge of precautions;Cardiopulmonary status limiting activity;Pain       PT Treatment Interventions DME instruction;Gait training;Functional mobility training;Therapeutic activities;Therapeutic exercise;Stair training;Balance training;Patient/family education    PT Goals (Current goals can be found in the Care Plan section)  Acute Rehab PT Goals Patient Stated Goal: to get better PT Goal Formulation: With patient Time For Goal Achievement: 08/20/19 Potential to Achieve Goals: Good    Frequency Min 3X/week   Barriers to discharge        Co-evaluation               AM-PAC PT "6 Clicks" Mobility  Outcome Measure Help needed turning from your back to your side while in a flat bed without using bedrails?: A Little Help needed moving from lying on your back to sitting on the side of a flat bed without using bedrails?: A Lot Help needed moving to and from a bed to a chair (including a wheelchair)?: A Lot Help needed standing up from a chair using your arms (e.g., wheelchair or bedside chair)?: A Little Help needed to walk in hospital room?: A Lot Help needed climbing 3-5 steps with a railing? : A Lot 6 Click Score: 14    End of Session Equipment Utilized During Treatment: Gait belt Activity Tolerance: Patient limited by fatigue;Patient limited by pain Patient left: in chair;with call bell/phone within reach;with chair alarm set Nurse Communication: Mobility status PT Visit Diagnosis: Muscle weakness (generalized) (M62.81);Unsteadiness on feet (R26.81);Pain Pain - part of body: (abdomen)    Time: [id number] PT Time Calculation (min)  (ACUTE ONLY): 20 min   Charges:   PT Evaluation $PT Eval Moderate Complexity: 1 Mod          Alisyn Lequire W,PT Acute Rehabilitation Services Pager:  [phone]  Office:  [place] 08/06/2019, 10:43 AM

## 2019-08-06 NOTE — Anesthesia Procedure Notes (Signed)
Procedure Name: Intubation Date/Time: 08/06/2019 1:26 AM Performed by: Josephine Igo, CRNA Pre-anesthesia Checklist: Patient identified, Emergency Drugs available, Suction available and Patient being monitored Patient Re-evaluated:Patient Re-evaluated prior to induction Oxygen Delivery Method: Circle system utilized Preoxygenation: Pre-oxygenation with 100% oxygen Induction Type: IV induction, Rapid sequence and Cricoid Pressure applied Laryngoscope Size: Miller and 2 Grade View: Grade I Tube type: Oral Tube size: 7.5 mm Number of attempts: 1 Airway Equipment and Method: Stylet Placement Confirmation: ETT inserted through vocal cords under direct vision,  positive ETCO2 and breath sounds checked- equal and bilateral Secured at: 22 cm Tube secured with: Tape Dental Injury: Teeth and Oropharynx as per pre-operative assessment  Comments: Upper dentures removed before laryngoscopy

## 2019-08-06 NOTE — Anesthesia Procedure Notes (Signed)
Arterial Line Insertion Start/End11/10/2018 1:15 AM, 08/06/2019 1:25 AM Performed by: Valetta Fuller, CRNA, CRNA  Right, radial was placed Catheter size: 20 G Hand hygiene performed , maximum sterile barriers used  and Seldinger technique used  Attempts: 1 Procedure performed without using ultrasound guided technique. Following insertion, dressing applied and Biopatch. Post procedure assessment: normal

## 2019-08-06 NOTE — Transfer of Care (Signed)
Immediate Anesthesia Transfer of Care Note  Patient: Cesar [name]  Procedure(s) Performed: SUPERIOR MESENTERIC ARTERY THROMBECTOMY (N/A Abdomen) Patch Angioplasty of superior messenteric artery with RIGHT femoral vein  Patient Location: ICU  Anesthesia Type:General  Level of Consciousness: awake and alert   Airway & Oxygen Therapy: Patient Spontanous Breathing and Patient connected to face mask oxygen  Post-op Assessment: Report given to RN and Post -op Vital signs reviewed and stable  Post vital signs: Reviewed and stable  Last Vitals:  Vitals Value Taken Time  BP 156/91 08/06/19 0404  Temp    Pulse 87 08/06/19 0410  Resp 23 08/06/19 0410  SpO2 95 % 08/06/19 0410  Vitals shown include unvalidated device data.  Last Pain:  Vitals:   08/06/19 0011  TempSrc: Oral  PainSc:          Complications: No apparent anesthesia complications

## 2019-08-06 NOTE — Progress Notes (Signed)
ANTICOAGULATION CONSULT NOTE - Follow Up Consult  Pharmacy Consult for heparin Indication: SMA thrombosis  Labs: Recent Labs    08/05/19 2001 08/06/19 0420 08/06/19 0459 08/06/19 1205 08/06/19 1836 08/06/19 2139  HGB 14.4 13.1 12.2* 12.7*  --   --   HCT 44.7 39.3 36.0* 38.3*  --   --   PLT 156 149*  --  146*  --   --   APTT  --  32  --   --   --   --   LABPROT 22.5* 15.3*  --   --   --   --   INR 2.0* 1.2  --   --   --   --   HEPARINUNFRC  --   --   --   --  1.12* 1.18*  CREATININE 1.53* 1.36*  --   --   --   --     Assessment: 85yo male supratherapeutic on heparin with initial dosing s/p thrombectomy; no gtt issues or signs of bleeding per RN.  Goal of Therapy:  Heparin level 0.3-0.7 units/ml   Plan:  Will hold heparin gtt x1hr then resume at decreased rate of 1100 units/hr and check level in 8 hours.    Wynona Neat, PharmD, BCPS  08/06/2019,11:05 PM

## 2019-08-06 NOTE — ED Provider Notes (Signed)
ED ECG REPORT   Date: 08/05/2019  Rate: 98  Rhythm: atrial fibrillation  QRS Axis: normal  Intervals: qt prolonged  ST/T Wave abnormalities: nonspecific ST changes  Conduction Disutrbances:none   I have personally reviewed the EKG tracing and agree with the computerized printout as noted.   Patient presents from [facility] after being found to have a clot to his SMA Patient is awake alert, diffuse abdominal tenderness.  He is already therapeutic on Coumadin. Pain medicines have been ordered.  Updated patient and son on plan.  Dr. Scot Dock with vascular surgery has been paged to see patient   Ripley Fraise, MD 08/06/19 0025

## 2019-08-06 NOTE — H&P (Signed)
REASON FOR CONSULT:    SMA embolus.  The consult is requested by [facility]  ASSESSMENT & PLAN:   SMA EMBOLUS: This patient has an SMA embolus by CT angiogram.  He is on Coumadin with an INR of 2.0.  I have recommended emergent SMA embolectomy.  He may require intraoperative FFP.  I have explained that we may have to take vein to patch the artery possibly.  I have explained the urgency of the procedure given that he has had symptoms since 230 and is at risk for bowel ischemia and necrosis.  I have explained that if he has evidence of bowel ischemia that does not improve after revascularization we may have to consult general surgery intraoperatively and he could potentially require bowel resection.  I have discussed the indications for the procedure and the potential complications with the patient and his son and he is agreeable to proceed.   [name], MD Office: [phone]   HPI:   Cesar [name] is a pleasant 85 y.o. male, who developed a sudden onset of abdominal pain at approximately 230 today after eating.  The pain persisted and was a scale of 6-7 out of 10.  He presented to the [facility] emergency department.  There his abdominal exam was not impressive.  Given pain out of proportion to exam CT angiogram was recommended and this showed an SMA embolus in the midportion of the SMA over a length of approximately 3-1/2 cm.  He was transferred urgently to [facility] health for vascular consultation.  The patient is on Coumadin for atrial fibrillation.  He denies any history of diabetes, hypertension, hypercholesterolemia, previous history of myocardial infarction or history of congestive heart failure.  On family history there is no family history of premature cardiovascular disease.  On social history he is not a smoker.  Past Medical History:  Diagnosis Date  . AAA (abdominal aortic aneurysm) ([place])    Possibly an erroneous entry: only imaging i see is  2017 and specifically shows "no aneurysmal dilation" of abdominal aorta  . Aortic stenosis    a. mild-mod by echo 06/2016.  Marland Kitchen Atrial fibrillation ([place])   . CKD (chronic kidney disease), stage III    baseline Cr 1.4-1.5 in 2016-2017 by PCP)  . CVA (cerebral infarction)   . Depression   . DVT (deep venous thrombosis) ([place]) 2006  . Dyslipidemia   . Hematuria   . HOH (hard of hearing)   . Hypercholesterolemia   . OSA (obstructive sleep apnea)    intol to CPAP  . Permanent atrial fibrillation ([place])   . Prostate enlargement   . Pulmonary nodules   . Thoracic ascending aortic aneurysm ([place])    4.5cm in 11/2018, stable since 2016    Family History  Problem Relation Age of Onset  . Heart failure Mother   . Diabetes Father   . Stroke Father     SOCIAL HISTORY: Social History   Socioeconomic History  . Marital status: Married    Spouse name: Not on file  . Number of children: 2  . Years of education: Not on file  . Highest education level: Not on file  Occupational History  . Not on file  Social Needs  . Financial resource strain: Not on file  . Food insecurity    Worry: Not on file    Inability: Not on file  . Transportation needs    Medical: Not on file    Non-medical:  Not on file  Tobacco Use  . Smoking status: Former Smoker    Packs/day: 1.00    Years: 10.00    Pack years: 10.00    Types: Cigarettes    Quit date: 10/05/1952    Years since quitting: 66.8  . Smokeless tobacco: Former Systems developer    Quit date: 10/05/1976  Substance and Sexual Activity  . Alcohol use: Yes    Alcohol/week: 0.0 standard drinks  . Drug use: No  . Sexual activity: Never  Lifestyle  . Physical activity    Days per week: Not on file    Minutes per session: Not on file  . Stress: Not on file  Relationships  . Social Herbalist on phone: Not on file    Gets together: Not on file    Attends religious service: Not on file    Active member of club or organization: Not on file     Attends meetings of clubs or organizations: Not on file    Relationship status: Not on file  . Intimate partner violence    Fear of current or ex partner: Not on file    Emotionally abused: Not on file    Physically abused: Not on file    Forced sexual activity: Not on file  Other Topics Concern  . Not on file  Social History Narrative  . Not on file    Allergies  Allergen Reactions  . Flomax [Tamsulosin Hcl] Other (See Comments)    Can't pee    Current Facility-Administered Medications  Medication Dose Route Frequency Provider Last Rate Last Dose  . 0.9 %  sodium chloride infusion   Intravenous Continuous Lucrezia Starch, MD 125 mL/hr at 08/05/19 2238    . heparin ADULT infusion 100 units/mL (25000 units/273mL sodium chloride 0.45%)  1,500 Units/hr Intravenous Continuous Lucrezia Starch, MD 15 mL/hr at 08/05/19 2240 1,500 Units/hr at 08/05/19 2240  . morphine 4 MG/ML injection 4 mg  4 mg Intravenous Once Ripley Fraise, MD       Current Outpatient Medications  Medication Sig Dispense Refill  . atorvastatin (LIPITOR) 80 MG tablet Take 1 tablet (80 mg total) by mouth daily. 60 tablet 3  . b complex vitamins capsule Take 1 capsule by mouth daily.    . finasteride (PROSCAR) 5 MG tablet Take 5 mg by mouth daily.    Marland Kitchen warfarin (COUMADIN) 5 MG tablet Take 7.5 mg by mouth daily.       REVIEW OF SYSTEMS:  [X]  denotes positive finding, [ ]  denotes negative finding Cardiac  Comments:  Chest pain or chest pressure:    Shortness of breath upon exertion:    Short of breath when lying flat:    Irregular heart rhythm: x A fib      Vascular    Pain in calf, thigh, or hip brought on by ambulation:    Pain in feet at night that wakes you up from your sleep:     Blood clot in your veins:    Leg swelling:         Pulmonary    Oxygen at home:    Productive cough:     Wheezing:         Neurologic    Sudden weakness in arms or legs:     Sudden numbness in arms or legs:      Sudden onset of difficulty speaking or slurred speech:    Temporary loss of vision in one eye:  Problems with dizziness:         Gastrointestinal    Blood in stool:     Vomited blood:         Genitourinary    Burning when urinating:     Blood in urine:        Psychiatric    Major depression:         Hematologic    Bleeding problems:    Problems with blood clotting too easily:        Skin    Rashes or ulcers:        Constitutional    Fever or chills:     PHYSICAL EXAM:   Vitals:   08/05/19 2307 08/05/19 2315 08/06/19 0011 08/06/19 0011  BP:  (!) 176/98    Pulse:  64 (!) 106   Resp:   20   Temp: 97.7 F (36.5 C)   (!) 97.3 F (36.3 C)  TempSrc: Oral   Oral  SpO2:  97% 96%   Weight:      Height:        GENERAL: The patient is a well-nourished male, in no acute distress. The vital signs are documented above. CARDIAC: There is a regular rate and rhythm.  VASCULAR: I do not detect carotid bruits. He has femoral pulses bilaterally and palpable dorsalis pedis pulses bilaterally. With the Doppler he has biphasic dorsalis pedis signals and monophasic posterior tibial signals. PULMONARY: There is good air exchange bilaterally without wheezing or rales. ABDOMEN: He has mild abdominal tenderness.  I do not palpate an aneurysm.  He has not had previous surgery. MUSCULOSKELETAL: There are no major deformities or cyanosis. NEUROLOGIC: No focal weakness or paresthesias are detected. SKIN: There are no ulcers or rashes noted. PSYCHIATRIC: The patient has a normal affect.  DATA:    CT ANGIOGRAM: I have reviewed his CT angiogram that was done at the referring hospital.  This shows an SMA embolus.  The distal SMA branches are noted to be patent.  There is some mild wall thickening of the transverse colon and splenic flexure possibly consistent with ischemic colitis.  There is a 1.7 cm hypoattenuating area within the pancreatic body.  Elective contrast-enhanced MRI is  recommended.  He does have aortic atherosclerosis.  LABS: His potassium is 3.9.  Creatinine is 1.53.  GFR is 40.  White blood cell count 8.9.  Hemoglobin 14.4.  Platelets 156,000.  INR is 2.0.  Covid test is negative.

## 2019-08-06 NOTE — Consult Note (Addendum)
Cardiology Consultation:   Patient ID: HAVEN FOSS MRN: [id number]; DOB: 05-04-1929  Admit date: 08/05/2019 Date of Consult: 08/06/2019  Primary Care Provider: Orpah Melter, MD Primary Cardiologist: [name], MD  Primary Electrophysiologist:  None    Patient Profile:   RIP HAWES is a 85 y.o. male with a hx of permanent atrial fib, with hxof prior CVA and systemic embolism, moderate aneurysm of ascending aorta (stable at 4.5 cm in size on CT) moderate AS,  who is being seen today for the evaluation of atrial fib and anticoagulation now admitted with new SMA embolus and acute mesenteric ischemia s/p embolectomy of superior mesenteric artery and vein patch angioplastysuperior mesenteric artery using right great saphenous vein at the request of Dr. Scot Dock.  History of Present Illness:   Mr. Camerer with above hx and no further eval of aortic aneurysm per Dr [name] due to non surgical candidate.  His AS is moderate per echo 07/31/19(mean gradient 33 mmHg) and trace AI with EF 60-65%, moderately increased LVH, LA severely dilated.  Mild MR.   He has never had cardiac cath, no angina.  He had a mildly abnormal nuclear stress test in 2003 suggestive of lateral wall ischemia, but I don't think this was ever associated with symptoms.   CHADSVasc at least 5 (age 38, CVA 2, PAD) in rate controlled atrial fib, permanent on coumadin.  In the past, temporary interruption of his warfarin for hematuria led to an embolic event to his left iliac artery.   Pt now with acute episode of abd pain after eating yesterday and presented to med center [facility] and CT of abd  showed an SMA embolus in the midportion of the SMA over a length of approximately 3-1/2 cm and transferred emergently to [facility] for vascular consult.  With eventual emergency surgery as above.     EKG:  The EKG was personally reviewed and demonstrates:  Atrial fib at 98 Qtc 502 but when compared to EKG 07/19/18 no acute changes and Qtc  492.  Telemetry:  Telemetry was personally reviewed and demonstrates:  A fib at 95   BP today 128/46 to 144/51  dilt drip at 5 Na 139, K+ 4.1 BUN 21, Cr 1.36, Mg+ 1.7  Hgb 12.2, Hct 36 and WBC 17.3  plts 149 down from 156.  INR 2.0 on admit and today 1.2  COVID neg.   Pt very hard of hearing, but no angina, no SOB, had not missed any coumadin before episode.    Past Medical History:  Diagnosis Date   AAA (abdominal aortic aneurysm) ([place])    Possibly an erroneous entry: only imaging i see is 2017 and specifically shows "no aneurysmal dilation" of abdominal aorta   Aortic stenosis    a. mild-mod by echo 06/2016.   Atrial fibrillation ([place])    CKD (chronic kidney disease), stage III    baseline Cr 1.4-1.5 in 2016-2017 by PCP)   CVA (cerebral infarction)    Depression    DVT (deep venous thrombosis) ([place]) 2006   Dyslipidemia    Hematuria    HOH (hard of hearing)    Hypercholesterolemia    OSA (obstructive sleep apnea)    intol to CPAP   Permanent atrial fibrillation [facility])    Prostate enlargement    Pulmonary nodules    Thoracic ascending aortic aneurysm ([place])    4.5cm in 11/2018, stable since 2016    Past Surgical History:  Procedure Laterality Date   EMBOLECTOMY Left 07/02/2016  Procedure: EMBOLECTOMY LEFT FEMORAL ARTERY;  Surgeon: [name], MD;  Location: [facility];  Service: Vascular;  Laterality: Left;   NM MYOCAR PERF WALL MOTION  08/24/2002   markedly positive,subtle lateral ischemia   PROSTATE SURGERY     US ECHOCARDIOGRAPHY  01/06/2012   mod. AOV ca+,mild to mod. AS,mod mostly posterior MAC-mild MR     Home Medications:  Prior to Admission medications   Medication Sig Start Date End Date Taking? Authorizing Provider  atorvastatin (LIPITOR) 80 MG tablet Take 1 tablet (80 mg total) by mouth daily. 03/16/19 11/11/19 Yes Barb Merino, MD  b complex vitamins capsule Take 1 capsule by mouth daily.   Yes [provider]  finasteride (PROSCAR) 5  MG tablet Take 5 mg by mouth daily.   Yes [provider]  warfarin (COUMADIN) 5 MG tablet Take 7.5 mg by mouth daily.    Yes [provider]    Inpatient Medications: Scheduled Meds:  sodium chloride   Intravenous Once   Chlorhexidine Gluconate Cloth  6 each Topical Daily   [START ON 08/07/2019] docusate sodium  100 mg Oral Daily   pantoprazole  40 mg Oral Daily   Continuous Infusions:  sodium chloride     sodium chloride 125 mL/hr at 08/06/19 0900   cefoTEtan (CEFOTAN) 1 GM IVPB (Mini-Bag Plus)     diltiazem (CARDIZEM) infusion 5 mg/hr (08/06/19 0900)   heparin 1,500 Units/hr (08/06/19 0900)   magnesium sulfate bolus IVPB     PRN Meds: sodium chloride, acetaminophen **OR** acetaminophen, alum & mag hydroxide-simeth, guaiFENesin-dextromethorphan, hydrALAZINE, labetalol, magnesium sulfate bolus IVPB, metoprolol tartrate, morphine injection, ondansetron, oxyCODONE-acetaminophen, phenol, potassium chloride  Allergies:    Allergies  Allergen Reactions   Flomax [Tamsulosin Hcl] Other (See Comments)    Can't pee    Social History:   Social History   Socioeconomic History   Marital status: Married    Spouse name: Not on file   Number of children: 2   Years of education: Not on file   Highest education level: Not on file  Occupational History   Not on file  Social Needs   Financial resource strain: Not on file   Food insecurity    Worry: Not on file    Inability: Not on file   Transportation needs    Medical: Not on file    Non-medical: Not on file  Tobacco Use   Smoking status: Former Smoker    Packs/day: 1.00    Years: 10.00    Pack years: 10.00    Types: Cigarettes    Quit date: 10/05/1952    Years since quitting: 66.8   Smokeless tobacco: Former Systems developer    Quit date: 10/05/1976  Substance and Sexual Activity   Alcohol use: Yes    Alcohol/week: 0.0 standard drinks   Drug use: No   Sexual activity: Never  Lifestyle    Physical activity    Days per week: Not on file    Minutes per session: Not on file   Stress: Not on file  Relationships   Social connections    Talks on phone: Not on file    Gets together: Not on file    Attends religious service: Not on file    Active member of club or organization: Not on file    Attends meetings of clubs or organizations: Not on file    Relationship status: Not on file   Intimate partner violence    Fear of current or ex partner:  Not on file    Emotionally abused: Not on file    Physically abused: Not on file    Forced sexual activity: Not on file  Other Topics Concern   Not on file  Social History Narrative   Not on file    Family History:    Family History  Problem Relation Age of Onset   Heart failure Mother    Diabetes Father    Stroke Father      ROS:  Please see the history of present illness.  General:no colds or fevers, no weight changes Skin:no rashes or ulcers HEENT:no blurred vision, no congestion CV:see HPI PUL:see HPI GI:no diarrhea constipation or melena, no indigestion GU:no hematuria, no dysuria MS:no joint pain, no claudication Neuro:no syncope, no lightheadedness Endo:no diabetes, no thyroid disease  All other ROS reviewed and negative.     Physical Exam/Data:   Vitals:   08/06/19 0700 08/06/19 0800 08/06/19 0900 08/06/19 1000  BP: 117/68 112/63 127/66   Pulse: 87 79 91 96  Resp: (!) _0 (!) 23  Temp: 97.9 F (36.6 C)     TempSrc: Oral     SpO2: 93% 94% 94%   Weight:      Height:        Intake/Output Summary (Last 24 hours) at 08/06/2019 1021 Last data filed at 08/06/2019 0900 Gross per 24 hour  Intake 4011.07 ml  Output 1280 ml  Net 2731.07 ml   Last 3 Weights 08/05/2019 08/02/2019 03/15/2019  Weight (lbs) 220 lb 211 lb 212 lb 4.9 oz  Weight (kg) 99.791 kg 95.709 kg 96.3 kg     Body mass index is 29.03 kg/m.  General:  Well nourished, well developed, in no acute distress up in chair - NG in  place HEENT: hard of hearing Lymph: no adenopathy Neck: no JVD on Lt,  Endocrine:  No thryomegaly Vascular: No carotid bruits; pedal pulses 2+ bilaterally  Cardiac:  irreg irreg ; RRR; 3/6 AS murmur, no gallup or rub  Lungs:  clear to auscultation bilaterally, no wheezing, rhonchi or rales  Abd: soft, nontender, no hepatomegaly, no BS, incision intact  Ext: no edema Musculoskeletal:  No deformities, BUE and BLE strength normal and equal Skin: warm and dry  Neuro:  Alert and oriented X 3 follows commands, no focal abnormalities noted Psych:  Normal affect    Relevant CV Studies: Echo 07/31/19  IMPRESSIONS    1. Left ventricular ejection fraction, by visual estimation, is 60 to 65%. The left ventricle has normal function. Normal left ventricular size. There is moderately increased left ventricular hypertrophy.  2. Elevated mean left atrial pressure.  3. Global right ventricle has normal systolic function.The right ventricular size is mildly enlarged.  4. Left atrial size was severely dilated.  5. Right atrial size was severely dilated.  6. Moderate mitral annular calcification.  7. The mitral valve is normal in structure. Mild mitral valve regurgitation. No evidence of mitral stenosis.  8. The tricuspid valve is normal in structure. Tricuspid valve regurgitation is mild.  9. The aortic valve is tricuspid Aortic valve regurgitation is trivial by color flow Doppler. Moderate aortic valve stenosis. 10. The pulmonic valve was normal in structure. Pulmonic valve regurgitation is mild by color flow Doppler. 11. Aortic dilatation noted. 12. There is mild dilatation of the aortic root measuring 39 mm. 13. Normal LV systolic function; moderate LVH; mildly dilated aortic root; severe biatrial enlargement; mild RVE; moderate AS (mean gradient 33 mmHg) and trace AI;  mild MR.  FINDINGS  Left Ventricle: Left ventricular ejection fraction, by visual estimation, is 60 to 65%. The left  ventricle has normal function. There is moderately increased left ventricular hypertrophy. Normal left ventricular size. The left ventricular diastology  could not be evaluated secondary to atrial fibrillation. Elevated mean left atrial pressure.  Right Ventricle: The right ventricular size is mildly enlarged. Global RV systolic function is has normal systolic function.  Left Atrium: Left atrial size was severely dilated.  Right Atrium: Right atrial size was severely dilated  Pericardium: There is no evidence of pericardial effusion.  Mitral Valve: The mitral valve is normal in structure. Moderate mitral annular calcification. No evidence of mitral valve stenosis by observation. Mild mitral valve regurgitation.  Tricuspid Valve: The tricuspid valve is normal in structure. Tricuspid valve regurgitation is mild by color flow Doppler.  Aortic Valve: The aortic valve is tricuspid. Aortic valve regurgitation is trivial by color flow Doppler. Aortic regurgitation PHT measures 450 msec. Moderate aortic stenosis is present. Aortic valve mean gradient measures 26.6 mmHg. Aortic valve peak  gradient measures 41.5 mmHg. Aortic valve area, by VTI measures 0.99 cm.  Pulmonic Valve: The pulmonic valve was normal in structure. Pulmonic valve regurgitation is mild by color flow Doppler.  Aorta: Aortic dilatation noted. There is mild dilatation of the aortic root measuring 39 mm.  Venous: The inferior vena cava was not well visualized.  IAS/Shunts: No atrial level shunt detected by color flow Doppler.       Laboratory Data:  High Sensitivity Troponin:  No results for input(s): TROPONINIHS in the last 720 hours.   Chemistry Recent Labs  Lab 08/05/19 2001 08/06/19 0420 08/06/19 0459  NA 141 139 141  K 3.9 4.1 4.2  CL 106 108  --   CO2 25 19*  --   GLUCOSE 138* 205*  --   BUN 26* 21  --   CREATININE 1.53* 1.36*  --   CALCIUM 8.9 7.9*  --   GFRNONAA 40* 46*  --   GFRAA 46*  53*  --   ANIONGAP 10 12  --     Recent Labs  Lab 08/05/19 2001 08/06/19 0420  PROT 7.4 5.7*  ALBUMIN 3.7 2.8*  AST 33 29  ALT 27 24  ALKPHOS 50 46  BILITOT 0.9 0.8   Hematology Recent Labs  Lab 08/05/19 2001 08/06/19 0420 08/06/19 0459  WBC 8.9 17.3*  --   RBC 4.59 4.05*  --   HGB 14.4 13.1 12.2*  HCT 44.7 39.3 36.0*  MCV 97.4 97.0  --   MCH 31.4 32.3  --   MCHC 32.2 33.3  --   RDW 13.1 13.0  --   PLT 156 149*  --    BNPNo results for input(s): BNP, PROBNP in the last 168 hours.  DDimer No results for input(s): DDIMER in the last 168 hours.   Radiology/Studies:  Dg Chest Port 1 View  Result Date: 08/06/2019 CLINICAL DATA:  Mesenteric artery thrombosis. EXAM: PORTABLE CHEST 1 VIEW COMPARISON:  03/15/2019 FINDINGS: Interval right jugular catheter with its tip in the superior vena cava. No pneumothorax. Interval nasogastric tube extending into the stomach. Mildly progressive enlargement of the cardiac silhouette, magnified by a poor inspiration. Tortuous and calcified thoracic aorta. Clear lungs with stable mildly prominent interstitial markings. The lungs were previously mildly hyperexpanded. Thoracic spine degenerative changes. Bilateral shoulder degenerative changes, right greater than left. IMPRESSION: 1. No acute abnormality. 2. Stable cardiomegaly and mild changes of COPD. Electronically  Signed   By: Claudie Revering M.D.   On: 08/06/2019 06:26   Dg Abd Portable 1v  Result Date: 08/06/2019 CLINICAL DATA:  Nasogastric tube placement. EXAM: PORTABLE ABDOMEN - 1 VIEW COMPARISON:  Abdomen and pelvis CT dated 08/05/2019 FINDINGS: Normal bowel gas pattern. Nasogastric tube extending into the stomach with its side hole in the distal stomach and tip in the region of the gastric pylorus or duodenal bulb. Lumbar and lower thoracic spine degenerative changes. Enlarged heart. IMPRESSION: 1. Nasogastric tube tip in the region of the gastric pylorus or duodenal bulb. 2. No acute  abnormality. 3. Cardiomegaly. Electronically Signed   By: Claudie Revering M.D.   On: 08/06/2019 06:27   Ct Angio Abd/pel W And/or Wo Contrast  Result Date: 08/05/2019 CLINICAL DATA:  Severe epigastric pain in the setting of atrial fibrillation. Concern for mesenteric ischemia. EXAM: CTA ABDOMEN AND PELVIS WITHOUT AND WITH CONTRAST TECHNIQUE: Multidetector CT imaging of the abdomen and pelvis was performed using the standard protocol during bolus administration of intravenous contrast. Multiplanar reconstructed images and MIPs were obtained and reviewed to evaluate the vascular anatomy. CONTRAST:  14m OMNIPAQUE IOHEXOL 350 MG/ML SOLN COMPARISON:  CT dated April 22, 2015 FINDINGS: VASCULAR Aorta: There are atherosclerotic changes throughout the abdominal aorta without evidence for an abdominal aortic aneurysm. Celiac: Patent without evidence of aneurysm, dissection, vasculitis or significant stenosis. SMA: The proximal SMA is patent. There is an acute occlusion of the mid SMA over length of approximately 3.7 cm (axial series 4, image 51 and sagittal series 6, image 55). The distal SMA branches are patent. Renals: Both renal arteries are patent without evidence of aneurysm, dissection, vasculitis, fibromuscular dysplasia or significant stenosis. IMA: Patent without evidence of aneurysm, dissection, vasculitis or significant stenosis. Inflow: Patent without evidence of aneurysm, dissection, vasculitis or significant stenosis. Proximal Outflow: Bilateral common femoral and visualized portions of the superficial and profunda femoral arteries are patent without evidence of aneurysm, dissection, vasculitis or significant stenosis. Veins: No obvious venous abnormality within the limitations of this arterial phase study. Review of the MIP images confirms the above findings. NON-VASCULAR Lower chest: The lung bases are clear.The heart is significantly enlarged. Hepatobiliary: The liver is normal. Normal gallbladder.There is  no biliary ductal dilation. Pancreas: There is a 1.7 cm hypoattenuating area within the pancreatic body (axial series 4, image 41). Spleen: No splenic laceration or hematoma. Adrenals/Urinary Tract: --Adrenal glands: No adrenal hemorrhage. --Right kidney/ureter: No hydronephrosis or perinephric hematoma. --Left kidney/ureter: There is a large cyst involving the left kidney. No left-sided hydronephrosis. There is some cortical thinning of the left kidney. --Urinary bladder: The urinary bladder is distended. Stomach/Bowel: --Stomach/Duodenum: No hiatal hernia or other gastric abnormality. Normal duodenal course and caliber. --Small bowel: No dilatation or inflammation. --Colon: There are scattered colonic diverticula without CT evidence for diverticulitis. There is some mild wall thickening of the transverse colon and splenic flexure of the colon. --Appendix: Normal. Lymphatic: --No retroperitoneal lymphadenopathy. --No mesenteric lymphadenopathy. --No pelvic or inguinal lymphadenopathy. Reproductive: The prostate gland is significantly enlarged. Other: No ascites or free air. The abdominal wall is normal. Musculoskeletal. No acute displaced fractures. IMPRESSION: 1. Acute thromboembolism of the SMA as detailed above. The distal SMA branches are patent. The IMA is patent. 2. Mild wall thickening of the transverse colon and splenic flexure may represent developing ischemic colitis in the setting of an SMA occlusion. Correlation with lactate is recommended. 3. Cardiomegaly. 4. Prostatomegaly.  The urinary bladder is moderately distended. 5. Large left-sided renal  cyst measuring approximately 12 cm. 6. There is a 1.7 cm hypoattenuating area within the pancreatic body. Follow-up with a nonemergent outpatient pancreatic protocol contrast enhanced MRI is recommended for further evaluation. These results were called by telephone at the time of interpretation on 08/05/2019 at 10:12 pm to provider [facility] , who  verbally acknowledged these results. Electronically Signed   By: Constance Holster M.D.   On: 08/05/2019 22:12    Assessment and Plan:   1. Acute SMA embolus with embolectomy and vein patch angioplasty after presentation with acute abd pain.   Recovering.  2.   HX of acute iliac embolus with embolectomy in past with temporary interruption of coumadin.  3.   Anticoagulation, INR on admit was 2.0.  Currently on IV heparin. Recommend starting Eliquis 5 mg BID once OK from a surgical standpoint.  4.   Permanent atrial fib with RVR post op, now controlled on IV dilt.  HR elevated with acute issues.  HR stable on 5 mg of dilt, was not on outpt rate control meds.  Suspect will be able to weak off gtt and not need much for rate control moving forward, as he does not require rate-controlling meds as outpatient  5.   Moderate AS with recent echo, mild -moderate concentric LVH, echo ordered for possible clot  6.  Moderate aneurysm of ascending aorta stable 11/2018 on serial CT, followed by Dr. [name], at 4.5 CM   7.  HLD on lipitor continue      For questions or updates, please contact [place] Please consult [email or web address] for contact info under     Signed, Cecilie Kicks, NP  08/06/2019 10:21 AM   Patient seen and examined.  Agree with above documentation.  Mr Rodas is an 85 year old male with a history of permanent atrial fibrillation, CVA, ascending aortic aneurysm, moderate aortic stenosis  being seen today to evaluate anticoagulation given SMA embolus causing acute mesenteric ischemia status post embolectomy.  Given that he had this event while therapeutic on Coumadin (INR 2.0), cardiology is consulted to determine anticoagulation moving forward.  He presented to the ED with sudden onset of abdominal pain.  CT scan showed SMA embolus.  INR was therapeutic (2.0).  From documentation from his wife, his last subtherapeutic INR was in June (checks monthly).  He had previously developed an  embolism when warfarin was held for hematuria.  On exam, he is very hard of hearing,  lungs are clear to auscultation bilaterally, heart rate is irregular, 3/ 6 systolic murmur loudest at RUSB, no lower extremity edema.  Given embolic event occurred while therapeutic on warfarin (though at lower end of therapeutic range), would favor changing to a DOAC.  Recommend starting Eliquis 5 mg twice daily once OK from a surgical standpoint.  Donato Heinz, MD

## 2019-08-07 ENCOUNTER — Encounter (HOSPITAL_COMMUNITY): Payer: Self-pay | Admitting: Vascular Surgery

## 2019-08-07 ENCOUNTER — Inpatient Hospital Stay (HOSPITAL_COMMUNITY): Payer: Medicare Other

## 2019-08-07 DIAGNOSIS — I712 Thoracic aortic aneurysm, without rupture: Secondary | ICD-10-CM

## 2019-08-07 DIAGNOSIS — I350 Nonrheumatic aortic (valve) stenosis: Secondary | ICD-10-CM

## 2019-08-07 LAB — POCT I-STAT 7, (LYTES, BLD GAS, ICA,H+H)
Bicarbonate: 26.1 mmol/L (ref 20.0–28.0)
Calcium, Ion: 1.16 mmol/L (ref 1.15–1.40)
HCT: 39 % (ref 39.0–52.0)
Hemoglobin: 13.3 g/dL (ref 13.0–17.0)
O2 Saturation: 100 %
Potassium: 4.8 mmol/L (ref 3.5–5.1)
Sodium: 139 mmol/L (ref 135–145)
TCO2: 28 mmol/L (ref 22–32)
pCO2 arterial: 49 mmHg — ABNORMAL HIGH (ref 32.0–48.0)
pH, Arterial: 7.335 — ABNORMAL LOW (ref 7.350–7.450)
pO2, Arterial: 539 mmHg — ABNORMAL HIGH (ref 83.0–108.0)

## 2019-08-07 LAB — BASIC METABOLIC PANEL
Anion gap: 8 (ref 5–15)
BUN: 24 mg/dL — ABNORMAL HIGH (ref 8–23)
CO2: 19 mmol/L — ABNORMAL LOW (ref 22–32)
Calcium: 7.4 mg/dL — ABNORMAL LOW (ref 8.9–10.3)
Chloride: 117 mmol/L — ABNORMAL HIGH (ref 98–111)
Creatinine, Ser: 1.43 mg/dL — ABNORMAL HIGH (ref 0.61–1.24)
GFR calc Af Amer: 50 mL/min — ABNORMAL LOW (ref >60)
GFR calc non Af Amer: 43 mL/min — ABNORMAL LOW (ref >60)
Glucose, Bld: 123 mg/dL — ABNORMAL HIGH (ref 70–99)
Potassium: 3.8 mmol/L (ref 3.5–5.1)
Sodium: 144 mmol/L (ref 135–145)

## 2019-08-07 LAB — LACTIC ACID, PLASMA: Lactic Acid, Venous: 1.2 mmol/L (ref 0.5–1.9)

## 2019-08-07 LAB — CBC
HCT: 35.7 % — ABNORMAL LOW (ref 39.0–52.0)
Hemoglobin: 11.6 g/dL — ABNORMAL LOW (ref 13.0–17.0)
MCH: 32 pg (ref 26.0–34.0)
MCHC: 32.5 g/dL (ref 30.0–36.0)
MCV: 98.6 fL (ref 80.0–100.0)
Platelets: 138 10*3/uL — ABNORMAL LOW (ref 150–400)
RBC: 3.62 MIL/uL — ABNORMAL LOW (ref 4.22–5.81)
RDW: 13.6 % (ref 11.5–15.5)
WBC: 15.7 10*3/uL — ABNORMAL HIGH (ref 4.0–10.5)
nRBC: 0 % (ref 0.0–0.2)

## 2019-08-07 LAB — PROTIME-INR
INR: 2 — ABNORMAL HIGH (ref 0.8–1.2)
Prothrombin Time: 22 seconds — ABNORMAL HIGH (ref 11.4–15.2)

## 2019-08-07 LAB — HEPARIN LEVEL (UNFRACTIONATED)
Heparin Unfractionated: 0.67 IU/mL (ref 0.30–0.70)
Heparin Unfractionated: 0.89 IU/mL — ABNORMAL HIGH (ref 0.30–0.70)

## 2019-08-07 MED ORDER — PANTOPRAZOLE SODIUM 40 MG PO PACK
40.0000 mg | PACK | Freq: Every day | ORAL | Status: DC
  Administered 2019-08-07 – 2019-08-13 (×7): 40 mg
  Filled 2019-08-07 (×7): qty 20

## 2019-08-07 MED ORDER — DOCUSATE SODIUM 50 MG/5ML PO LIQD
100.0000 mg | Freq: Every day | ORAL | Status: DC
  Administered 2019-08-07 – 2019-08-13 (×6): 100 mg
  Filled 2019-08-07 (×7): qty 10

## 2019-08-07 MED ORDER — TRAVASOL 10 % IV SOLN: INTRAVENOUS | Status: DC

## 2019-08-07 MED FILL — Dextrose Inj 5%: INTRAVENOUS | Qty: 100 | Status: AC

## 2019-08-07 MED FILL — Diltiazem HCl IV Soln 25 MG/5ML (5 MG/ML): INTRAVENOUS | Qty: 25 | Status: AC

## 2019-08-07 NOTE — Progress Notes (Signed)
Progress Note  Patient Name: Cesar [name] Date of Encounter: 08/07/2019  Primary Cardiologist: [name], MD   Subjective   Complains of abdominal pain, but looks remarkably well and comfortable considering what is been through.  Would like to eat and drink, which I would take is a good sign.  He has no active bowel sounds and has not passed gas.  Inpatient Medications    Scheduled Meds:  sodium chloride   Intravenous Once   Chlorhexidine Gluconate Cloth  6 each Topical Daily   docusate  100 mg Per Tube Daily   pantoprazole sodium  40 mg Per Tube Daily   Continuous Infusions:  sodium chloride     sodium chloride 125 mL/hr at 08/07/19 0900   cefoTEtan (CEFOTAN) 1 GM IVPB (Mini-Bag Plus) 1 g (08/07/19 0949)   diltiazem (CARDIZEM) infusion 2.5 mg/hr (08/07/19 0900)   heparin 1,100 Units/hr (08/07/19 0900)   magnesium sulfate bolus IVPB     PRN Meds: sodium chloride, acetaminophen **OR** acetaminophen, alum & mag hydroxide-simeth, guaiFENesin-dextromethorphan, hydrALAZINE, labetalol, magnesium sulfate bolus IVPB, metoprolol tartrate, morphine injection, ondansetron, oxyCODONE-acetaminophen, phenol, potassium chloride   Vital Signs    Vitals:   08/07/19 0700 08/07/19 0741 08/07/19 0800 08/07/19 0900  BP: (!) 146/83  127/79 137/86  Pulse: 95  64 (!) 107  Resp: (!) 24  (!) 22 (!) 29  Temp:  (!) 97.5 F (36.4 C)    TempSrc:  Oral    SpO2: 96%  95% 91%  Weight:      Height:        Intake/Output Summary (Last 24 hours) at 08/07/2019 0959 Last data filed at 08/07/2019 0900 Gross per 24 hour  Intake 3457.2 ml  Output 1460 ml  Net 1997.2 ml   Last 3 Weights 08/05/2019 08/02/2019 03/15/2019  Weight (lbs) 220 lb 211 lb 212 lb 4.9 oz  Weight (kg) 99.791 kg 95.709 kg 96.3 kg      Telemetry    Atrial fibrillation, rate controlled- Personally Reviewed  ECG    Atrial fibrillation, leftward axis and poor R wave progression- Personally Reviewed  Physical  Exam  Obese GEN: No acute distress.   Neck: No JVD Cardiac:  Irregular, 2/6 aortic ejection murmur, no diastolic murmurs, rubs, or gallops.  Respiratory: Clear to auscultation bilaterally. GI:  Distended, mildly tender, no bowel sounds are heard MS: No edema; No deformity. Neuro:  Nonfocal, very hard of hearing Psych: Normal affect   Labs    High Sensitivity Troponin:  No results for input(s): TROPONINIHS in the last 720 hours.    Chemistry Recent Labs  Lab 08/05/19 2001 08/06/19 0420 08/06/19 0459 08/07/19 0313  NA 141 139 141 144  K 3.9 4.1 4.2 3.8  CL 106 108  --  117*  CO2 25 19*  --  19*  GLUCOSE 138* 205*  --  123*  BUN 26* 21  --  24*  CREATININE 1.53* 1.36*  --  1.43*  CALCIUM 8.9 7.9*  --  7.4*  PROT 7.4 5.7*  --   --   ALBUMIN 3.7 2.8*  --   --   AST 33 29  --   --   ALT 27 24  --   --   ALKPHOS 50 46  --   --   BILITOT 0.9 0.8  --   --   GFRNONAA 40* 46*  --  43*  GFRAA 46* 53*  --  50*  ANIONGAP 10 12  --  8  Hematology Recent Labs  Lab 08/06/19 0420 08/06/19 0459 08/06/19 1205 08/07/19 0313  WBC 17.3*  --  17.5* 15.7*  RBC 4.05*  --  3.95* 3.62*  HGB 13.1 12.2* 12.7* 11.6*  HCT 39.3 36.0* 38.3* 35.7*  MCV 97.0  --  97.0 98.6  MCH 32.3  --  32.2 32.0  MCHC 33.3  --  33.2 32.5  RDW 13.0  --  13.3 13.6  PLT 149*  --  146* 138*    BNPNo results for input(s): BNP, PROBNP in the last 168 hours.   DDimer No results for input(s): DDIMER in the last 168 hours.   Radiology    Dg Chest Port 1 View  Result Date: 08/07/2019 CLINICAL DATA:  Chest soreness EXAM: PORTABLE CHEST 1 VIEW COMPARISON:  August 06, 2019 FINDINGS: The enteric tube extends below the left hemidiaphragm. The right-sided central venous catheter is well position. There is no pneumothorax. The heart size is stable and enlarged from prior study. There is postoperative atelectasis at the lung bases bilaterally. There is no displaced fracture. IMPRESSION: 1. Lines and tubes as  above.  No pneumothorax. 2. Mild cardiomegaly. 3. Postoperative atelectasis at the lung bases. Electronically Signed   By: Constance Holster M.D.   On: 08/07/2019 05:56   Dg Chest Port 1 View  Result Date: 08/06/2019 CLINICAL DATA:  Mesenteric artery thrombosis. EXAM: PORTABLE CHEST 1 VIEW COMPARISON:  03/15/2019 FINDINGS: Interval right jugular catheter with its tip in the superior vena cava. No pneumothorax. Interval nasogastric tube extending into the stomach. Mildly progressive enlargement of the cardiac silhouette, magnified by a poor inspiration. Tortuous and calcified thoracic aorta. Clear lungs with stable mildly prominent interstitial markings. The lungs were previously mildly hyperexpanded. Thoracic spine degenerative changes. Bilateral shoulder degenerative changes, right greater than left. IMPRESSION: 1. No acute abnormality. 2. Stable cardiomegaly and mild changes of COPD. Electronically Signed   By: Claudie Revering M.D.   On: 08/06/2019 06:26   Dg Abd Portable 1v  Result Date: 08/06/2019 CLINICAL DATA:  Nasogastric tube placement. EXAM: PORTABLE ABDOMEN - 1 VIEW COMPARISON:  Abdomen and pelvis CT dated 08/05/2019 FINDINGS: Normal bowel gas pattern. Nasogastric tube extending into the stomach with its side hole in the distal stomach and tip in the region of the gastric pylorus or duodenal bulb. Lumbar and lower thoracic spine degenerative changes. Enlarged heart. IMPRESSION: 1. Nasogastric tube tip in the region of the gastric pylorus or duodenal bulb. 2. No acute abnormality. 3. Cardiomegaly. Electronically Signed   By: Claudie Revering M.D.   On: 08/06/2019 06:27   Ct Angio Abd/pel W And/or Wo Contrast  Result Date: 08/05/2019 CLINICAL DATA:  Severe epigastric pain in the setting of atrial fibrillation. Concern for mesenteric ischemia. EXAM: CTA ABDOMEN AND PELVIS WITHOUT AND WITH CONTRAST TECHNIQUE: Multidetector CT imaging of the abdomen and pelvis was performed using the standard protocol  during bolus administration of intravenous contrast. Multiplanar reconstructed images and MIPs were obtained and reviewed to evaluate the vascular anatomy. CONTRAST:  50mL OMNIPAQUE IOHEXOL 350 MG/ML SOLN COMPARISON:  CT dated April 22, 2015 FINDINGS: VASCULAR Aorta: There are atherosclerotic changes throughout the abdominal aorta without evidence for an abdominal aortic aneurysm. Celiac: Patent without evidence of aneurysm, dissection, vasculitis or significant stenosis. SMA: The proximal SMA is patent. There is an acute occlusion of the mid SMA over length of approximately 3.7 cm (axial series 4, image 51 and sagittal series 6, image 55). The distal SMA branches are patent. Renals: Both renal arteries  are patent without evidence of aneurysm, dissection, vasculitis, fibromuscular dysplasia or significant stenosis. IMA: Patent without evidence of aneurysm, dissection, vasculitis or significant stenosis. Inflow: Patent without evidence of aneurysm, dissection, vasculitis or significant stenosis. Proximal Outflow: Bilateral common femoral and visualized portions of the superficial and profunda femoral arteries are patent without evidence of aneurysm, dissection, vasculitis or significant stenosis. Veins: No obvious venous abnormality within the limitations of this arterial phase study. Review of the MIP images confirms the above findings. NON-VASCULAR Lower chest: The lung bases are clear.The heart is significantly enlarged. Hepatobiliary: The liver is normal. Normal gallbladder.There is no biliary ductal dilation. Pancreas: There is a 1.7 cm hypoattenuating area within the pancreatic body (axial series 4, image 41). Spleen: No splenic laceration or hematoma. Adrenals/Urinary Tract: --Adrenal glands: No adrenal hemorrhage. --Right kidney/ureter: No hydronephrosis or perinephric hematoma. --Left kidney/ureter: There is a large cyst involving the left kidney. No left-sided hydronephrosis. There is some cortical thinning  of the left kidney. --Urinary bladder: The urinary bladder is distended. Stomach/Bowel: --Stomach/Duodenum: No hiatal hernia or other gastric abnormality. Normal duodenal course and caliber. --Small bowel: No dilatation or inflammation. --Colon: There are scattered colonic diverticula without CT evidence for diverticulitis. There is some mild wall thickening of the transverse colon and splenic flexure of the colon. --Appendix: Normal. Lymphatic: --No retroperitoneal lymphadenopathy. --No mesenteric lymphadenopathy. --No pelvic or inguinal lymphadenopathy. Reproductive: The prostate gland is significantly enlarged. Other: No ascites or free air. The abdominal wall is normal. Musculoskeletal. No acute displaced fractures. IMPRESSION: 1. Acute thromboembolism of the SMA as detailed above. The distal SMA branches are patent. The IMA is patent. 2. Mild wall thickening of the transverse colon and splenic flexure may represent developing ischemic colitis in the setting of an SMA occlusion. Correlation with lactate is recommended. 3. Cardiomegaly. 4. Prostatomegaly.  The urinary bladder is moderately distended. 5. Large left-sided renal cyst measuring approximately 12 cm. 6. There is a 1.7 cm hypoattenuating area within the pancreatic body. Follow-up with a nonemergent outpatient pancreatic protocol contrast enhanced MRI is recommended for further evaluation. These results were called by telephone at the time of interpretation on 08/05/2019 at 10:12 pm to provider [facility] , who verbally acknowledged these results. Electronically Signed   By: Constance Holster M.D.   On: 08/05/2019 22:12    Cardiac Studies   Echo 07/31/19   1. Left ventricular ejection fraction, by visual estimation, is 60 to 65%. The left ventricle has normal function. Normal left ventricular size. There is moderately increased left ventricular hypertrophy. 2. Elevated mean left atrial pressure. 3. Global right ventricle has normal  systolic function.The right ventricular size is mildly enlarged. 4. Left atrial size was severely dilated. 5. Right atrial size was severely dilated. 6. Moderate mitral annular calcification. 7. The mitral valve is normal in structure. Mild mitral valve regurgitation. No evidence of mitral stenosis. 8. The tricuspid valve is normal in structure. Tricuspid valve regurgitation is mild. 9. The aortic valve is tricuspid Aortic valve regurgitation is trivial by color flow Doppler. Moderate aortic valve stenosis. 10. The pulmonic valve was normal in structure. Pulmonic valve regurgitation is mild by color flow Doppler. 11. Aortic dilatation noted. 12. There is mild dilatation of the aortic root measuring 39 mm. 13. Normal LV systolic function; moderate LVH; mildly dilated aortic root; severe biatrial enlargement; mild RVE; moderate AS (mean gradient 33 mmHg) and trace AI; mild MR.  Patient Profile     85 y.o. 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 AFib: Rate controlled on current regimen.  On intravenous heparin with plan to start Eliquis instead of warfarin. 2.  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  He has done well so far after surgery but still has persistent ileus.  It is encouraging that his white blood cell count is diminishing. 3.  Moderate aortic stenosis: Asymptomatic to date. 3.  Moderate aneurysm of the ascending aorta: Stable in size.  Conservative management is planned so serial CT angiography will no longer be performed.     For questions or updates, please contact [place] Please consult [email or web address] for contact info under         Signed, [name], MD  08/07/2019, 9:59 AM

## 2019-08-07 NOTE — Plan of Care (Signed)

## 2019-08-07 NOTE — Evaluation (Signed)
Occupational Therapy Evaluation Patient Details Name: Cesar [name] MRN: [id number] DOB: 1928/12/15 Today's Date: 08/07/2019    History of Present Illness Pt admit for SUPERIOR MESENTERIC ARTERY THROMBECTOMY and Patch Angioplasty of superior messenteric artery with RIGHT femoral vein.  Post op afib.    Clinical Impression   PTA patient independent with ADLs, using cane for mobility with supervision.  Admitted for above and limited by problem list below, including impaired balance, generalized weakness, decreased activity tolerance, decreased safety awareness, and abdominal pain.  Patient currently requires min assist for UB ADLs, mod assist for LB ADLs, mod assist for transfers, and min assist for bed mobility. Cognitively, patient alert and oriented, requires increased time for processing and problem solving with poor awareness to safety (impulsive at times). Anticipate he will progress well once pain is better controlled, he will have 24/7 support at home. Patient will benefit from continued OT services while admitted and after dc at [facility] level in order to maximize independence and return to PLOF with ADLs/mobility.     Follow Up Recommendations  Home health OT;Supervision/Assistance - 24 hour    Equipment Recommendations  None recommended by OT    Recommendations for Other Services       Precautions / Restrictions Precautions Precautions: Fall Precaution Comments: NG tube Restrictions Weight Bearing Restrictions: No      Mobility Bed Mobility Overal bed mobility: Needs Assistance Bed Mobility: Supine to Sit     Supine to sit: Min assist;HOB elevated     General bed mobility comments: discussed log rolling technique for pain mgmt and comfort, but patient impulsively sitting up to EOB by brings legs to EOB then requiring min assist for trunk  Transfers Overall transfer level: Needs assistance   Transfers: Sit to/from Stand;Stand Pivot Transfers Sit to Stand: Mod  assist Stand pivot transfers: Min assist       General transfer comment: cueing for positioning and technique (pt attempting to stand without feet touching floor initally), mod assist to power up from EOB and steady, min assist to pivot to recliner     Balance Overall balance assessment: Needs assistance Sitting-balance support: No upper extremity supported;Feet supported Sitting balance-Leahy Scale: Fair     Standing balance support: Single extremity supported;During functional activity Standing balance-Leahy Scale: Poor Standing balance comment: relies on UE support and external support                           ADL either performed or assessed with clinical judgement   ADL Overall ADL's : Needs assistance/impaired     Grooming: Minimal assistance;Sitting   Upper Body Bathing: Minimal assistance;Sitting   Lower Body Bathing: Moderate assistance;Sit to/from stand   Upper Body Dressing : Minimal assistance;Sitting   Lower Body Dressing: Moderate assistance;Sit to/from stand Lower Body Dressing Details (indicate cue type and reason): decreased reach to BLEs, abdominal pain, steady assist in standing  Toilet Transfer: Stand-pivot;Moderate assistance Toilet Transfer Details (indicate cue type and reason): simulated to recliner          Functional mobility during ADLs: Moderate assistance;Cueing for safety;Cueing for sequencing General ADL Comments: pt limited by abdominal pain, impaired balance, generalized weakness      Vision   Vision Assessment?: No apparent visual deficits     Perception     Praxis      Pertinent Vitals/Pain Pain Assessment: Faces Faces Pain Scale: Hurts whole lot Pain Location: abdomen Pain Descriptors / Indicators: Grimacing;Guarding;Sore Pain Intervention(s): Monitored  during session;Repositioned;Premedicated before session     Hand Dominance Left   Extremity/Trunk Assessment Upper Extremity Assessment Upper Extremity  Assessment: Generalized weakness   Lower Extremity Assessment Lower Extremity Assessment: Defer to PT evaluation   Cervical / Trunk Assessment Cervical / Trunk Assessment: Normal   Communication Communication Communication: HOH   Cognition Arousal/Alertness: Awake/alert Behavior During Therapy: WFL for tasks assessed/performed;Impulsive Overall Cognitive Status: Impaired/Different from baseline Area of Impairment: Attention;Following commands;Safety/judgement;Awareness;Problem solving                   Current Attention Level: Sustained   Following Commands: Follows one step commands consistently;Follows one step commands with increased time;Follows multi-step commands inconsistently Safety/Judgement: Decreased awareness of safety;Decreased awareness of deficits Awareness: Emergent Problem Solving: Slow processing;Difficulty sequencing;Requires verbal cues General Comments: pt impulsive during session, eager to get OOB; limited assessment due to [facility] and not wearing hearing aides but requires increased time for processing, cueing for problem sovling and safety awareness   General Comments  VSS    Exercises     Shoulder Instructions      Home Living Family/patient expects to be discharged to:: Private residence Living Arrangements: Spouse/significant other;Children Available Help at Discharge: Family;Available 24 hours/day Type of Home: House Home Access: Stairs to enter [employer] of Steps: 1 Entrance Stairs-Rails: Right Home Layout: One level     Bathroom Shower/Tub: Tub/shower unit;Walk-in shower   Bathroom Toilet: Standard     Home Equipment: Environmental consultant - 2 wheels;Bedside commode;Cane - single point;Wheelchair - manual          Prior Functioning/Environment Level of Independence: Needs assistance  Gait / Transfers Assistance Needed: wife supervised walking with pt using cane ADL's / Homemaking Assistance Needed: pt able to complete ADLs without  assist             OT Problem List: Decreased strength;Decreased activity tolerance;Impaired balance (sitting and/or standing);Decreased safety awareness;Decreased knowledge of use of DME or AE;Decreased knowledge of precautions;Pain      OT Treatment/Interventions: Self-care/ADL training;Energy conservation;DME and/or AE instruction;Therapeutic activities;Patient/family education;Balance training    OT Goals(Current goals can be found in the care plan section) Acute Rehab OT Goals Patient Stated Goal: to get home  OT Goal Formulation: With patient Time For Goal Achievement: 08/21/19 Potential to Achieve Goals: Good  OT Frequency: Min 2X/week   Barriers to D/C:            Co-evaluation              AM-PAC OT "6 Clicks" Daily Activity     Outcome Measure Help from another person eating meals?: Total(NPO) Help from another person taking care of personal grooming?: A Little Help from another person toileting, which includes using toliet, bedpan, or urinal?: A Lot Help from another person bathing (including washing, rinsing, drying)?: A Lot Help from another person to put on and taking off regular upper body clothing?: A Little Help from another person to put on and taking off regular lower body clothing?: A Lot 6 Click Score: 13   End of Session Nurse Communication: Mobility status  Activity Tolerance: Patient tolerated treatment well Patient left: in chair;with call bell/phone within reach;with family/visitor present  OT Visit Diagnosis: Other abnormalities of gait and mobility (R26.89);Muscle weakness (generalized) (M62.81);Pain Pain - part of body: (abdomen)                Time: [id number] OT Time Calculation (min): 31 min Charges:  OT General Charges $OT Visit: 1 Visit OT Evaluation $OT  Eval Moderate Complexity: 1 Mod OT Treatments $Self Care/Home Management : 8-22 mins  Delight Stare, OT Acute Rehabilitation Services Pager [phone] Office  [phone]   Delight Stare 08/07/2019, 5:05 PM

## 2019-08-07 NOTE — Progress Notes (Signed)
08/07/2019 1700 While in room still checking patient into room pt's NGT fell out.  Pt has had no N/V, BS are positive in all 4 Quads, asked Dr. Oneida Alar who had just come to floor if NGT could be left out.  Dr. Valinda Hoar gave the OK to leave NGT out but absolutely nothing PO and if becomes nauseated/vomiting replace NGT. [place]

## 2019-08-07 NOTE — Progress Notes (Signed)
Initial Nutrition Assessment  DOCUMENTATION CODES:   Not applicable  INTERVENTION:    TPN to meet 100% of needs- management per pharmacy    Recommend trial of post-pyloric jejunal feeding   NUTRITION DIAGNOSIS:   Increased nutrient needs related to post-op healing as evidenced by estimated needs.  GOAL:   Patient will meet greater than or equal to 90% of their needs  MONITOR:   Diet advancement, TF tolerance, Weight trends, Labs, I & O's, Skin  REASON FOR ASSESSMENT:   Rounds    ASSESSMENT:   Patient with PMH significant for AAA, aortic stenosis, thoracic ascending aortic aneurysm, CKD III, CVA, HOH, depression, and hypercholesterolemia. Presents this admission with SMA embolus.   11/1- embolectomy of SMA, vein patch angioplasty SMA  Spoke with daughter at bedside. Denies pt had loss of appetite PTA. Typically eats three meals daily that consist of B- eggs, cereal, oatmeal L- sandwich or salad D- meat, vegetable, grain. He does not use supplementation at home. Is currently NPO with NGT to suction. Recommend post-pyloric/jejunal feeding. MD declined. Plan for TPN.   Daughter endorses UBW of 220 lb and denies recent weight loss. Records indicate pt weighed 96.3 kg on 6/10 and 95.7 kg this admission (insignficiant for time frame).   I/O: +4,669 ml since admit UOP: 1,115 ml x 24 hrs  NGT: 300 ml x 24 hrs   Drips: NS @ 125 ml/hr  Medications: colace Labs: CBG 123-205 Cr 1.43 (baseline 1.4-1.5)   NUTRITION - FOCUSED PHYSICAL EXAM:    Most Recent Value  Orbital Region  No depletion  Upper Arm Region  No depletion  Thoracic and Lumbar Region  No depletion  Buccal Region  No depletion  Temple Region  No depletion  Clavicle Bone Region  Mild depletion  Clavicle and Acromion Bone Region  Mild depletion  Scapular Bone Region  No depletion  Dorsal Hand  No depletion  Patellar Region  No depletion  Anterior Thigh Region  No depletion  Posterior Calf Region  No  depletion  Edema (RD Assessment)  None  Hair  Reviewed  Eyes  Reviewed  Mouth  Reviewed  Skin  Reviewed  Nails  Reviewed     Diet Order:   Diet Order            Diet NPO time specified  Diet effective now              EDUCATION NEEDS:   Not appropriate for education at this time  Skin:  Skin Assessment: Skin Integrity Issues: Skin Integrity Issues:: Incisions Incisions: R groin, abdomen  Last BM:  PTA  Height:   Ht Readings from Last 1 Encounters:  08/05/19 6\' 1"  (1.854 m)    Weight:   Wt Readings from Last 1 Encounters:  08/05/19 99.8 kg    Ideal Body Weight:  83.6 kg  BMI:  Body mass index is 29.03 kg/m.  Estimated Nutritional Needs:   Kcal:  2300-2500 kcal  Protein:  115-130 grams  Fluid:  >/= 2.3 L/day   Mariana Single RD, LDN Clinical Nutrition Pager # - [phone]

## 2019-08-07 NOTE — Progress Notes (Signed)
ANTICOAGULATION CONSULT NOTE - Initial Consult  Pharmacy Consult for IV heparin Indication: s/p SMA embolectomy + afib  Allergies  Allergen Reactions  . Flomax [Tamsulosin Hcl] Other (See Comments)    Can't pee    Patient Measurements: Height: 6\' 1"  (185.4 cm) Weight: 220 lb (99.8 kg) IBW/kg (Calculated) : 79.9 Heparin Dosing Weight: 99.8 kg  Vital Signs: Temp: 97.5 F (36.4 C) (11/02 0741) Temp Source: Oral (11/02 0741) BP: 133/77 (11/02 1000) Pulse Rate: 105 (11/02 1000)  Labs: Recent Labs    08/05/19 2001 08/06/19 0420 08/06/19 0459 08/06/19 1205 08/06/19 1836 08/06/19 2139 08/07/19 0313 08/07/19 0816  HGB 14.4 13.1 12.2* 12.7*  --   --  11.6*  --   HCT 44.7 39.3 36.0* 38.3*  --   --  35.7*  --   PLT 156 149*  --  146*  --   --  138*  --   APTT  --  32  --   --   --   --   --   --   LABPROT 22.5* 15.3*  --   --   --   --  22.0*  --   INR 2.0* 1.2  --   --   --   --  2.0*  --   HEPARINUNFRC  --   --   --   --  1.12* 1.18*  --  0.67  CREATININE 1.53* 1.36*  --   --   --   --  1.43*  --     Estimated Creatinine Clearance: 44.4 mL/min (A) (by C-G formula based on SCr of 1.43 mg/dL (H)).   Medical History: Past Medical History:  Diagnosis Date  . AAA (abdominal aortic aneurysm) ([place])    Possibly an erroneous entry: only imaging i see is 2017 and specifically shows "no aneurysmal dilation" of abdominal aorta  . Aortic stenosis    a. mild-mod by echo 06/2016.  Marland Kitchen Atrial fibrillation ([place])   . CKD (chronic kidney disease), stage III    baseline Cr 1.4-1.5 in 2016-2017 by PCP)  . CVA (cerebral infarction)   . Depression   . DVT (deep venous thrombosis) ([place]) 2006  . Dyslipidemia   . Hematuria   . HOH (hard of hearing)   . Hypercholesterolemia   . OSA (obstructive sleep apnea)    intol to CPAP  . Permanent atrial fibrillation ([place])   . Prostate enlargement   . Pulmonary nodules   . Thoracic ascending aortic aneurysm ([place])    4.5cm in 11/2018, stable since  2016    Medications:  Infusions:  . sodium chloride    . sodium chloride Stopped (08/07/19 0949)  . cefoTEtan (CEFOTAN) 1 GM IVPB (Mini-Bag Plus) 200 mL/hr at 08/07/19 1000  . diltiazem (CARDIZEM) infusion Stopped (08/07/19 0945)  . heparin 1,100 Units/hr (08/07/19 1000)  . magnesium sulfate bolus IVPB      Assessment: 85 yo male on IV heparin gtt for afib and recent SMA embolectomy.  Heparin level at goal this AM, no overt bleeding or complications noted.  Goal of Therapy:  Heparin level 0.3-0.7 units/ml Monitor platelets by anticoagulation protocol: Yes   Plan:  Continue IV heparin at current rate of 1100 units/hr Confirm heparin level in 8 hrs Daily heparin level and CBC F/u ability to start Eliquis as able. Will need Eliquis education prior to discharge.  Marguerite Olea, [facility] Clinical Pharmacist Phone [phone]  08/07/2019 10:32 AM

## 2019-08-07 NOTE — Progress Notes (Signed)
   VASCULAR SURGERY ASSESSMENT & PLAN:   POD 1 -  SMA EMBOLECTOMY WITH VEIN PATCH ANGIOPLASTY: He appears to be doing well from this standpoint.  No focal tenderness on exam.  I have ordered a lactic acid but I cannot find any results on this.  His white blood cell count is coming down.  CARDIAC: He has atrial fibrillation and the rate is now well controlled on Cardizem.  An echo has been ordered to rule out a cardiac source of embolization.  He had an embolic event despite therapeutic Coumadin (INR 2.0).  Appreciate cardiology's help.  ANTICOAGULATION: Full dose heparin to begin now.  Can start DOAC as soon as he is taking p.o.,  Potentially as soon as tomorrow.  GI/NUTRITION:  He still has no bowel sounds and I suspect he may have an ileus.  For this reason I will keep his NG tube in place for now and I have also ordered TNA.  ID: Continue cefotetan for now.  TRANSFER TO 4 E  SUBJECTIVE:   Complains of incisional pain only.  No flatus.  No bowel movement.  No nausea.  Thirsty.  PHYSICAL EXAM:   Vitals:   08/07/19 0300 08/07/19 0400 08/07/19 0500 08/07/19 0600  BP: 128/73 (!) 145/69 134/81 127/74  Pulse: 82 97 (!) 101 92  Resp: 17 (!) 25 17 (!) 23  Temp:  97.7 F (36.5 C)    TempSrc:  Oral    SpO2: 95% 96% 96% 96%  Weight:      Height:       ABDOMEN: No bowel sounds LUNGS: Decreased breath sounds at bases VASCULAR: Both feet are warm and well-perfused.  No lower extremity swelling.  LABS:   Lab Results  Component Value Date   WBC 15.7 (H) 08/07/2019   HGB 11.6 (L) 08/07/2019   HCT 35.7 (L) 08/07/2019   MCV 98.6 08/07/2019   PLT 138 (L) 08/07/2019   Lab Results  Component Value Date   CREATININE 1.43 (H) 08/07/2019   Lab Results  Component Value Date   INR 2.0 (H) 08/07/2019    PROBLEM LIST:    Active Problems:   Superior mesenteric artery thrombosis (HCC)   Mesenteric artery thrombosis (HCC)   CURRENT MEDS:   . sodium chloride   Intravenous Once   . Chlorhexidine Gluconate Cloth  6 each Topical Daily  . docusate sodium  100 mg Oral Daily  . pantoprazole  40 mg Oral Daily    [name] Office: [phone] 08/07/2019

## 2019-08-07 NOTE — Progress Notes (Signed)
ANTICOAGULATION CONSULT NOTE - Follow Up Consult  Pharmacy Consult for IV Heparin Indication: s/p SMA embolectomy and atrial fibrillation  Allergies  Allergen Reactions  . Flomax [Tamsulosin Hcl] Other (See Comments)    Can't pee    Patient Measurements: Height: 6\' 1"  (185.4 cm) Weight: 220 lb (99.8 kg) IBW/kg (Calculated) : 79.9 Heparin Dosing Weight: 99.8 kg  Vital Signs: Temp: 97.6 F (36.4 C) (11/02 1642) Temp Source: Axillary (11/02 1642) BP: 132/75 (11/02 1642) Pulse Rate: 126 (11/02 1642)  Labs: Recent Labs    08/05/19 2001  08/06/19 0420 08/06/19 0459 08/06/19 1205  08/06/19 2139 08/07/19 0313 08/07/19 0816 08/07/19 1604  HGB 14.4   < > 13.1 12.2* 12.7*  --   --  11.6*  --   --   HCT 44.7   < > 39.3 36.0* 38.3*  --   --  35.7*  --   --   PLT 156  --  149*  --  146*  --   --  138*  --   --   APTT  --   --  32  --   --   --   --   --   --   --   LABPROT 22.5*  --  15.3*  --   --   --   --  22.0*  --   --   INR 2.0*  --  1.2  --   --   --   --  2.0*  --   --   HEPARINUNFRC  --   --   --   --   --    < > 1.18*  --  0.67 0.89*  CREATININE 1.53*  --  1.36*  --   --   --   --  1.43*  --   --    < > = values in this interval not displayed.    Estimated Creatinine Clearance: 44.4 mL/min (A) (by C-G formula based on SCr of 1.43 mg/dL (H)).   Medications:  Infusions:  . sodium chloride    . sodium chloride 125 mL/hr at 08/07/19 1600  . cefoTEtan (CEFOTAN) 1 GM IVPB (Mini-Bag Plus) Stopped (08/07/19 1019)  . diltiazem (CARDIZEM) infusion Stopped (08/07/19 0945)  . heparin 1,100 Units/hr (08/07/19 1600)  . magnesium sulfate bolus IVPB      Assessment: 85 year old male on IV Heparin for atrial fibrillation and recent SMA embolectomy.   Heparin level is above goal on recheck this PM at 0.89 at a rate of 1100 units/hr. No overt bleeding noted and none reported on transfer.  Will reduce rate and recheck in 6 hours.   Goal of Therapy:  Heparin level 0.3-0.7  units/ml Monitor platelets by anticoagulation protocol: Yes   Plan:  Reduce Heparin to 850 units/hr.  Recheck heparin level in 6 hours.  Daily heparin level and CBC while on therapy.   Sloan Leiter, PharmD, BCPS, BCCCP Clinical Pharmacist Clinical phone 08/07/2019 until 10:30P [phone] Please refer to AMION for [place] numbers 08/07/2019,4:58 PM

## 2019-08-07 NOTE — Progress Notes (Signed)
08/07/2019 1640 Received tx to room 4E-26 from [place].  Pt is A&O, wants to sit up in recliner.  Tele monitor applied and CCMD notified.  CHG bath given.  Oriented to room, call light and bed.  Call bell in reach. [place]

## 2019-08-07 NOTE — Progress Notes (Signed)
PHARMACY - ADULT TOTAL PARENTERAL NUTRITION CONSULT NOTE   Pharmacy Consult for TPN Indication: Suspected Ileus  Patient Measurements: Height: 6\' 1"  (185.4 cm) Weight: 220 lb (99.8 kg) IBW/kg (Calculated) : 79.9 TPN AdjBW (KG): 99.8 Body mass index is 29.03 kg/m.  Plan:  TPN ordered past cut-off time for TPN to be compounded today.  TPN pharmacist will evaluate and start TPN tomorrow.  Discussed with Dr. Scot Dock - no D10 needed this PM.   Brain Hilts 08/07/2019,4:14 PM

## 2019-08-08 LAB — COMPREHENSIVE METABOLIC PANEL
ALT: 17 U/L (ref 0–44)
AST: 27 U/L (ref 15–41)
Albumin: 2.3 g/dL — ABNORMAL LOW (ref 3.5–5.0)
Alkaline Phosphatase: 45 U/L (ref 38–126)
Anion gap: 10 (ref 5–15)
BUN: 26 mg/dL — ABNORMAL HIGH (ref 8–23)
CO2: 21 mmol/L — ABNORMAL LOW (ref 22–32)
Calcium: 7.5 mg/dL — ABNORMAL LOW (ref 8.9–10.3)
Chloride: 113 mmol/L — ABNORMAL HIGH (ref 98–111)
Creatinine, Ser: 1.57 mg/dL — ABNORMAL HIGH (ref 0.61–1.24)
GFR calc Af Amer: 45 mL/min — ABNORMAL LOW (ref >60)
GFR calc non Af Amer: 39 mL/min — ABNORMAL LOW (ref >60)
Glucose, Bld: 115 mg/dL — ABNORMAL HIGH (ref 70–99)
Potassium: 3.7 mmol/L (ref 3.5–5.1)
Sodium: 144 mmol/L (ref 135–145)
Total Bilirubin: 0.9 mg/dL (ref 0.3–1.2)
Total Protein: 5.5 g/dL — ABNORMAL LOW (ref 6.5–8.1)

## 2019-08-08 LAB — DIFFERENTIAL
Abs Immature Granulocytes: 0.11 10*3/uL — ABNORMAL HIGH (ref 0.00–0.07)
Basophils Absolute: 0 10*3/uL (ref 0.0–0.1)
Basophils Relative: 0 %
Eosinophils Absolute: 0 10*3/uL (ref 0.0–0.5)
Eosinophils Relative: 0 %
Immature Granulocytes: 1 %
Lymphocytes Relative: 7 %
Lymphs Abs: 1 10*3/uL (ref 0.7–4.0)
Monocytes Absolute: 1.3 10*3/uL — ABNORMAL HIGH (ref 0.1–1.0)
Monocytes Relative: 9 %
Neutro Abs: 11.1 10*3/uL — ABNORMAL HIGH (ref 1.7–7.7)
Neutrophils Relative %: 83 %

## 2019-08-08 LAB — MAGNESIUM: Magnesium: 1.6 mg/dL — ABNORMAL LOW (ref 1.7–2.4)

## 2019-08-08 LAB — CBC
HCT: 32.7 % — ABNORMAL LOW (ref 39.0–52.0)
Hemoglobin: 10.6 g/dL — ABNORMAL LOW (ref 13.0–17.0)
MCH: 32.2 pg (ref 26.0–34.0)
MCHC: 32.4 g/dL (ref 30.0–36.0)
MCV: 99.4 fL (ref 80.0–100.0)
Platelets: 142 10*3/uL — ABNORMAL LOW (ref 150–400)
RBC: 3.29 MIL/uL — ABNORMAL LOW (ref 4.22–5.81)
RDW: 14.2 % (ref 11.5–15.5)
WBC: 13.4 10*3/uL — ABNORMAL HIGH (ref 4.0–10.5)
nRBC: 0 % (ref 0.0–0.2)

## 2019-08-08 LAB — PROTIME-INR
INR: 2 — ABNORMAL HIGH (ref 0.8–1.2)
Prothrombin Time: 22 seconds — ABNORMAL HIGH (ref 11.4–15.2)

## 2019-08-08 LAB — PREALBUMIN: Prealbumin: 10.7 mg/dL — ABNORMAL LOW (ref 18–38)

## 2019-08-08 LAB — HEPARIN LEVEL (UNFRACTIONATED)
Heparin Unfractionated: 0.31 IU/mL (ref 0.30–0.70)
Heparin Unfractionated: 0.43 IU/mL (ref 0.30–0.70)

## 2019-08-08 LAB — SURGICAL PATHOLOGY

## 2019-08-08 LAB — TRIGLYCERIDES: Triglycerides: 116 mg/dL (ref <150)

## 2019-08-08 LAB — PHOSPHORUS: Phosphorus: 2 mg/dL — ABNORMAL LOW (ref 2.5–4.6)

## 2019-08-08 MED ORDER — METOPROLOL TARTRATE 25 MG PO TABS
25.0000 mg | ORAL_TABLET | Freq: Two times a day (BID) | ORAL | Status: DC
  Administered 2019-08-08 – 2019-08-12 (×10): 25 mg via ORAL
  Filled 2019-08-08 (×10): qty 1

## 2019-08-08 MED ORDER — MAGNESIUM SULFATE 2 GM/50ML IV SOLN
2.0000 g | Freq: Once | INTRAVENOUS | Status: AC
  Administered 2019-08-08: 2 g via INTRAVENOUS
  Filled 2019-08-08: qty 50

## 2019-08-08 MED ORDER — POTASSIUM PHOSPHATES 15 MMOLE/5ML IV SOLN
25.0000 mmol | Freq: Once | INTRAVENOUS | Status: AC
  Administered 2019-08-08: 25 mmol via INTRAVENOUS
  Filled 2019-08-08: qty 8.33

## 2019-08-08 MED ORDER — IPRATROPIUM-ALBUTEROL 0.5-2.5 (3) MG/3ML IN SOLN
3.0000 mL | RESPIRATORY_TRACT | Status: DC | PRN
  Administered 2019-08-09 – 2019-08-11 (×2): 3 mL via RESPIRATORY_TRACT
  Filled 2019-08-08 (×3): qty 3

## 2019-08-08 NOTE — Progress Notes (Signed)
PHARMACY - ADULT TOTAL PARENTERAL NUTRITION CONSULT NOTE   Pharmacy Consult: TPN Indication:  Mesenteric ischemia  Patient Measurements: Height: 6\' 1"  (185.4 cm) Weight: 220 lb (99.8 kg) IBW/kg (Calculated) : 79.9 TPN AdjBW (KG): 99.8 Body mass index is 29.03 kg/m.  Assessment:  58 YOM presented on 08/05/19 with sudden abdominal pain after eating [employer], found to have SMA thrombus causing mesenteric ischemia. He is s/p embolectomy with vein patch angioplasty on 08/06/19.  Pharmacy consulted to manage TPN as MD declined a trial of post-pyloric jejunal feeding.  Per RD documentation, no recent weight loss nor loss of appetite PTA.  GI: BL prealbumin low at 10.7, no NG O/P documented, LBM 11/2.  Docusate, PPI PT Endo: no hx DM - AM glucose 115-205 Insulin requirements in the past 24 hours: N/A Lytes: Na high normal, high CL and low CO2, Phos 2, Mag 1.6, others WNL Renal: CKD3 - SCr up 1.57, BUN 26 - UOP 0.2 ml/kg/hr, NS at 125 ml/hr Pulm: OSA - stable on RA Cards: Afib/AAA.  Coumadin s/p KCentra 11/1 >> Heparin for Afib + new SMA thrombus.  BP ok, HR variable 36-126 Hepatobil: LFTs / tbili / amylase / lipase / TG WNL Neuro: CVA - PRN morphine ID: Cefotetan (11/1 >> ) - afebrile, WBC down 13.4, LA 1.2 TPN Access: CVC double lumen placed 08/06/19 TPN start date: 08/08/19  Nutritional Goals (per RD rec on 11/2): 2300-2500 kCal, 115-130gm protein, >/= 2.3L per day  Current Nutrition:  NPO  Plan:  Hold off on starting TPN as patient's bowel function is returning and today is day #3 of NPO  KPhos 25 mmol IV x 1 Mag sulfate 2gm IV x 1  Pharmacy will sign off.  Further electrolyte management per MD.  Remigio Eisenmenger D. Mina Marble, PharmD, BCPS, [place] 08/08/2019, 7:46 AM

## 2019-08-08 NOTE — Progress Notes (Addendum)
Progress Note  Patient Name: EDERSON SHIAO Date of Encounter: 08/08/2019  Primary Cardiologist: [name], MD   Subjective   Mr. Maciver reports that he is feeling well today. He denies any abdominal pain, reports having some gas yesterday. Denies having any bowel movements. Denies any chest pain, shortness of breath, or other symptoms.   Inpatient Medications    Scheduled Meds: . sodium chloride   Intravenous Once  . Chlorhexidine Gluconate Cloth  6 each Topical Daily  . docusate  100 mg Per Tube Daily  . pantoprazole sodium  40 mg Per Tube Daily   Continuous Infusions: . sodium chloride    . sodium chloride 125 mL/hr at 08/08/19 0655  . cefoTEtan (CEFOTAN) 1 GM IVPB (Mini-Bag Plus) 1 g (08/07/19 2326)  . diltiazem (CARDIZEM) infusion Stopped (08/07/19 0945)  . heparin 850 Units/hr (08/08/19 0137)  . magnesium sulfate bolus IVPB    . magnesium sulfate bolus IVPB    . potassium PHOSPHATE IVPB (in mmol)     PRN Meds: sodium chloride, acetaminophen **OR** acetaminophen, alum & mag hydroxide-simeth, guaiFENesin-dextromethorphan, hydrALAZINE, labetalol, magnesium sulfate bolus IVPB, metoprolol tartrate, morphine injection, ondansetron, oxyCODONE-acetaminophen, phenol, potassium chloride   Vital Signs    Vitals:   08/07/19 2019 08/07/19 2326 08/08/19 0346 08/08/19 0822  BP: 138/79 (!) 130/92 136/90 (!) 135/91  Pulse: 83 100 (!) 105 (!) 123  Resp: (!) 29 20 19 20   Temp: 98.1 F (36.7 C) 97.8 F (36.6 C) 98 F (36.7 C) 98.1 F (36.7 C)  TempSrc: Oral Oral Oral Oral  SpO2: 96% 97% 98% 93%  Weight:      Height:        Intake/Output Summary (Last 24 hours) at 08/08/2019 0901 Last data filed at 08/08/2019 0700 Gross per 24 hour  Intake 976.04 ml  Output 755 ml  Net 221.04 ml   Filed Weights   08/05/19 1959  Weight: 99.8 kg    Telemetry    Atrial fibrillation - Personally Reviewed  ECG    Atrial fibrillation - Personally Reviewed  Physical Exam    GEN: No acute distress, sitting up in chair.   Neck: No JVD Cardiac: Irregular, 2/6 systolic murmur, no diastolic murmurs, rubs, or gallops Respiratory: Clear to auscultation bilaterally. GI: Soft, nontender, mildly distended, hypoactive bowel sounds, well healing midline surgical scar  MS: No edema; No deformity. Neuro:  Nonfocal, hearing aids in place Psych: Normal affect   Labs    Chemistry Recent Labs  Lab 08/05/19 2001  08/06/19 0420 08/06/19 0459 08/07/19 0313 08/08/19 0416  NA 141   < > 139 141 144 144  K 3.9   < > 4.1 4.2 3.8 3.7  CL 106  --  108  --  117* 113*  CO2 25  --  19*  --  19* 21*  GLUCOSE 138*  --  205*  --  123* 115*  BUN 26*  --  21  --  24* 26*  CREATININE 1.53*  --  1.36*  --  1.43* 1.57*  CALCIUM 8.9  --  7.9*  --  7.4* 7.5*  PROT 7.4  --  5.7*  --   --  5.5*  ALBUMIN 3.7  --  2.8*  --   --  2.3*  AST 33  --  29  --   --  27  ALT 27  --  24  --   --  17  ALKPHOS 50  --  46  --   --  45  BILITOT 0.9  --  0.8  --   --  0.9  GFRNONAA 40*  --  46*  --  43* 39*  GFRAA 46*  --  53*  --  50* 45*  ANIONGAP 10  --  12  --  8 10   < > = values in this interval not displayed.     Hematology Recent Labs  Lab 08/06/19 1205 08/07/19 0313 08/08/19 0416  WBC 17.5* 15.7* 13.4*  RBC 3.95* 3.62* 3.29*  HGB 12.7* 11.6* 10.6*  HCT 38.3* 35.7* 32.7*  MCV 97.0 98.6 99.4  MCH 32.2 32.0 32.2  MCHC 33.2 32.5 32.4  RDW 13.3 13.6 14.2  PLT 146* 138* 142*    Cardiac EnzymesNo results for input(s): TROPONINI in the last 168 hours. No results for input(s): TROPIPOC in the last 168 hours.   BNPNo results for input(s): BNP, PROBNP in the last 168 hours.   DDimer No results for input(s): DDIMER in the last 168 hours.   Radiology    Dg Chest Port 1 View  Result Date: 08/07/2019 CLINICAL DATA:  Chest soreness EXAM: PORTABLE CHEST 1 VIEW COMPARISON:  August 06, 2019 FINDINGS: The enteric tube extends below the left hemidiaphragm. The right-sided central venous  catheter is well position. There is no pneumothorax. The heart size is stable and enlarged from prior study. There is postoperative atelectasis at the lung bases bilaterally. There is no displaced fracture. IMPRESSION: 1. Lines and tubes as above.  No pneumothorax. 2. Mild cardiomegaly. 3. Postoperative atelectasis at the lung bases. Electronically Signed   By: Constance Holster M.D.   On: 08/07/2019 05:56    Cardiac Studies   Echo 07/31/19   1. Left ventricular ejection fraction, by visual estimation, is 60 to 65%. The left ventricle has normal function. Normal left ventricular size. There is moderately increased left ventricular hypertrophy. 2. Elevated mean left atrial pressure. 3. Global right ventricle has normal systolic function.The right ventricular size is mildly enlarged. 4. Left atrial size was severely dilated. 5. Right atrial size was severely dilated. 6. Moderate mitral annular calcification. 7. The mitral valve is normal in structure. Mild mitral valve regurgitation. No evidence of mitral stenosis. 8. The tricuspid valve is normal in structure. Tricuspid valve regurgitation is mild. 9. The aortic valve is tricuspid Aortic valve regurgitation is trivial by color flow Doppler. Moderate aortic valve stenosis. 10. The pulmonic valve was normal in structure. Pulmonic valve regurgitation is mild by color flow Doppler. 11. Aortic dilatation noted. 12. There is mild dilatation of the aortic root measuring 39 mm. 13. Normal LV systolic function; moderate LVH; mildly dilated aortic root; severe biatrial enlargement; mild RVE; moderate AS (mean gradient 33 mmHg) and trace AI; mild MR.  Patient Profile     85 y.o. 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    Assessment & Plan    1. Permanent atrial fibrillation: Patient currently has elevated heart rate, did receive 1 dose of IV metoprolol overnight. Currently on IV heparin, plan to transition to eliquis instead of warfarin due to failure on eliquis. Will need to add a beta blocker to achieve rate control.  2. Acute SMA embolism: S/p SMA embolectomy. Developed ileus following procedure, currently has bowel sounds present and reports passing gas, trailing on a liquid diet now. Overall he is  doing well from this standpoint, his white count continues to improve.  3. Moderate aortic stenosis: Asymptomatic to date 4. Moderate aneurysm of ascending aorta: Stable. Conservative management planned, serial CT angiogram will no longer be performed.   For questions or updates, please contact [place] Please consult [email or web address] for contact info under Cardiology/STEMI.  Signed, Asencion Noble, MD  08/08/2019, 9:01 AM    I have seen and examined the patient along with Asencion Noble, MD .  I have reviewed the chart, notes and new data.  I agree with her note.  Key new complaints: able to eat Jell-o, has had BM Key examination changes: abdomen still slightly tender and mildly distended. Mild RVR Key new findings / data: Telemetry shows ventricular rate 110s at rest.  PLAN: Add low dose beta blocker (was spontaneously rate controlled at home, but now has pain and postop increased adrenergic drive).  [name], MD, [place] [phone] 08/08/2019, 11:07 AM

## 2019-08-08 NOTE — Progress Notes (Addendum)
  Progress Note \ VASCULAR SURGERY ASSESSMENT & PLAN:   POD 2 - SMA EMBOLECTOMY WITH VEIN PATCH ANGIOPLASTY: He appears to be doing well from this standpoint.  No focal tenderness on exam.  He has had 2 bowel movements and is passing flatus.  His NG tube is out.  He will start his diet slowly.  This reason we will hold off on TNA.  CARDIAC: He has atrial fibrillation and the rate is now well controlled.  He is off IV Cardizem .  He can start a DOAC this afternoon if he tolerates p.o.  ANTICOAGULATION: Continue intravenous heparin until he is on his DOAC.  GI/NUTRITION:Start diet slowly  ID: Continue cefotetan for now.  [name], MD Office: [phone]   08/08/2019 7:36 AM 2 Days Post-Op  Subjective:  Wants to go home.  Had 2 bowel movements yesterday   Vitals:   08/07/19 2326 08/08/19 0346  BP: (!) 130/92 136/90  Pulse: 100 (!) 105  Resp: 20 19  Temp: 97.8 F (36.6 C) 98 F (36.7 C)  SpO2: 97% 98%   Physical Exam: Lungs:  Non labored Incisions:  Midline incision c/d/i Extremities: Feet symmetrically warm Abdomen:  Soft, NT Neurologic: A&O  CBC    Component Value Date/Time   WBC 13.4 (H) 08/08/2019 0416   RBC 3.29 (L) 08/08/2019 0416   HGB 10.6 (L) 08/08/2019 0416   HCT 32.7 (L) 08/08/2019 0416   PLT 142 (L) 08/08/2019 0416   MCV 99.4 08/08/2019 0416   MCH 32.2 08/08/2019 0416   MCHC 32.4 08/08/2019 0416   RDW 14.2 08/08/2019 0416   LYMPHSABS 1.0 08/08/2019 0416   MONOABS 1.3 (H) 08/08/2019 0416   EOSABS 0.0 08/08/2019 0416   BASOSABS 0.0 08/08/2019 0416    BMET    Component Value Date/Time   NA 144 08/08/2019 0416   NA 139 07/10/2016   K 3.7 08/08/2019 0416   CL 113 (H) 08/08/2019 0416   CO2 21 (L) 08/08/2019 0416   GLUCOSE 115 (H) 08/08/2019 0416   BUN 26 (H) 08/08/2019 0416   BUN 20 07/10/2016   CREATININE 1.57 (H) 08/08/2019 0416   CALCIUM 7.5 (L) 08/08/2019 0416   GFRNONAA 39 (L) 08/08/2019 0416   GFRAA 45 (L) 08/08/2019 0416     INR    Component Value Date/Time   INR 2.0 (H) 08/08/2019 0416     Intake/Output Summary (Last 24 hours) at 08/08/2019 0736 Last data filed at 08/08/2019 0700 Gross per 24 hour  Intake 1256.86 ml  Output 890 ml  Net 366.86 ml     Assessment/Plan:  85 y.o. male is s/p SMA embolectomy with vein patch 2 Days Post-Op   -Passing flatus and bowel movement x2; NG tube removed, advance diet -Cardiology managing A fib with plans to switch coumadin to Eliquis; will discuss timing of converting from IV heparin to Eliquis with Dr. Scot Dock -white count trending down, continue Cefotetan PT/OT recommending [place]; case manager consulted -home when tolerating regular diet and converted back to Eliquis   Dagoberto Ligas, PA-C [facility] [phone] 08/08/2019 7:36 AM

## 2019-08-08 NOTE — Progress Notes (Addendum)
ANTICOAGULATION CONSULT NOTE - Follow Up Consult  Pharmacy Consult for heparin Indication: atrial fibrillation and SMA thrombosis  Labs: Recent Labs    08/06/19 0420  08/06/19 1205  08/07/19 0313  08/07/19 1604 08/08/19 0034 08/08/19 0416 08/08/19 0417  HGB 13.1   < > 12.7*  --  11.6*  --   --   --  10.6*  --   HCT 39.3   < > 38.3*  --  35.7*  --   --   --  32.7*  --   PLT 149*  --  146*  --  138*  --   --   --  142*  --   APTT 32  --   --   --   --   --   --   --   --   --   LABPROT 15.3*  --   --   --  22.0*  --   --   --  22.0*  --   INR 1.2  --   --   --  2.0*  --   --   --  2.0*  --   HEPARINUNFRC  --   --   --    < >  --    < > 0.89* 0.43  --  0.31  CREATININE 1.36*  --   --   --  1.43*  --   --   --  1.57*  --    < > = values in this interval not displayed.    Assessment/Plan:  85yo male with afib currently therapeutic on heparin. Per MD plan to transition to po anticoagulation once tolerating po intake.   Hemoglobin trending down from 14 to now 10.6, plt stable 140s. No signs or symptoms of bleeding noted.   Plan:  Heparin level at low end of goal, will adjust rate to 900 units/hr.  Heparin level in am   Erin Hearing PharmD., BCPS Clinical Pharmacist 08/08/2019 2:05 PM

## 2019-08-08 NOTE — Progress Notes (Signed)
Physical Therapy Treatment Patient Details Name: Cesar [name] MRN: [id number] DOB: [birth year] Today's Date: 08/08/2019    History of Present Illness Pt admit for SUPERIOR MESENTERIC ARTERY THROMBECTOMY and Patch Angioplasty of superior messenteric artery with RIGHT femoral vein.  Post op afib.     PT Comments    Patient progressing well towards PT goals. Tolerated gait training today with Min guard-Min A for balance/safety and use of RW for support. HR up to 137 bpm, A-fib and Sp02 remained >90% on RA. Required 1 seated rest break due to 2-3/4 DOE and fatigue. Needs cues for safety and for self monitoring symptoms. Requires Mod A initially to stand from the chair progressing to Min A with proper technique. Daughter present during session and concerned about his heart rate. RN notified of HR. Recommend use of RW at home for safety and to decrease fall risk. Will continue to follow.    Follow Up Recommendations  Home health PT;Supervision/Assistance - 24 hour     Equipment Recommendations  None recommended by PT    Recommendations for Other Services       Precautions / Restrictions Precautions Precautions: Fall Precaution Comments: watch HR- Afib Restrictions Weight Bearing Restrictions: No    Mobility  Bed Mobility               General bed mobility comments: Up in chair upon PT arrival.  Transfers Overall transfer level: Needs assistance Equipment used: Rolling walker (2 wheeled) Transfers: Sit to/from Stand Sit to Stand: Mod assist;Min assist         General transfer comment: Initially Mod A of 2 to power to standing progressing to min A with cues for hand placement/technique. Stood from chair x4, from toilet x1.  Ambulation/Gait Ambulation/Gait assistance: Min assist;Min guard Gait Distance (Feet): 60 Feet(+60') Assistive device: Rolling walker (2 wheeled) Gait Pattern/deviations: Step-through pattern;Decreased stride length;Shuffle;Trunk flexed Gait  velocity: decreased   General Gait Details: Slow, shuffling like gait with RW for support; Min A at times. 2-3/4 DOE. Poor self monitoring of symptoms. HR up to 137 bpm A-fib. 1 seated rest break.   Stairs             Wheelchair Mobility    Modified Rankin (Stroke Patients Only)       Balance Overall balance assessment: Needs assistance Sitting-balance support: Feet supported;No upper extremity supported Sitting balance-Leahy Scale: Fair     Standing balance support: During functional activity Standing balance-Leahy Scale: Poor Standing balance comment: relies on UE support and external support                            Cognition Arousal/Alertness: Awake/alert Behavior During Therapy: WFL for tasks assessed/performed Overall Cognitive Status: Impaired/Different from baseline Area of Impairment: Attention;Safety/judgement;Problem solving                   Current Attention Level: Sustained     Safety/Judgement: Decreased awareness of safety;Decreased awareness of deficits   Problem Solving: Slow processing;Requires verbal cues General Comments: Impulsive at times. Difficult to accurately assess cognition as pt without hearing aids. Jokes appropriately.      Exercises      General Comments General comments (skin integrity, edema, etc.): Daughter present during session. Sp02 remained >90% on RA, HR up to 137 bpm Afib.      Pertinent Vitals/Pain Pain Assessment: No/denies pain    Home Living  Prior Function            PT Goals (current goals can now be found in the care plan section) Progress towards PT goals: Progressing toward goals    Frequency    Min 3X/week      PT Plan Current plan remains appropriate    Co-evaluation              AM-PAC PT "6 Clicks" Mobility   Outcome Measure  Help needed turning from your back to your side while in a flat bed without using bedrails?: A  Little Help needed moving from lying on your back to sitting on the side of a flat bed without using bedrails?: A Lot Help needed moving to and from a bed to a chair (including a wheelchair)?: A Lot Help needed standing up from a chair using your arms (e.g., wheelchair or bedside chair)?: A Little Help needed to walk in hospital room?: A Little Help needed climbing 3-5 steps with a railing? : A Lot 6 Click Score: 15    End of Session Equipment Utilized During Treatment: Gait belt Activity Tolerance: Patient tolerated treatment well Patient left: in chair;with call bell/phone within reach;with chair alarm set;with family/visitor present Nurse Communication: Mobility status PT Visit Diagnosis: Muscle weakness (generalized) (M62.81);Unsteadiness on feet (R26.81)     Time: [id number] PT Time Calculation (min) (ACUTE ONLY): 30 min  Charges:  $Gait Training: 8-22 mins $Therapeutic Activity: 8-22 mins                     Marisa Severin, PT, DPT Acute Rehabilitation Services Pager [phone] Office [place] 08/08/2019, 1:01 PM

## 2019-08-08 NOTE — Progress Notes (Signed)
ANTICOAGULATION CONSULT NOTE - Follow Up Consult  Pharmacy Consult for heparin Indication: atrial fibrillation and SMA thrombosis  Labs: Recent Labs    08/05/19 2001  08/06/19 0420 08/06/19 0459 08/06/19 1205  08/07/19 0313 08/07/19 0816 08/07/19 1604 08/08/19 0034  HGB 14.4   < > 13.1 12.2* 12.7*  --  11.6*  --   --   --   HCT 44.7   < > 39.3 36.0* 38.3*  --  35.7*  --   --   --   PLT 156  --  149*  --  146*  --  138*  --   --   --   APTT  --   --  32  --   --   --   --   --   --   --   LABPROT 22.5*  --  15.3*  --   --   --  22.0*  --   --   --   INR 2.0*  --  1.2  --   --   --  2.0*  --   --   --   HEPARINUNFRC  --   --   --   --   --    < >  --  0.67 0.89* 0.43  CREATININE 1.53*  --  1.36*  --   --   --  1.43*  --   --   --    < > = values in this interval not displayed.    Assessment/Plan:  85yo male therapeutic on heparin after rate change. Will continue gtt at current rate and confirm stable with additional level.   Wynona Neat, PharmD, BCPS  08/08/2019,1:00 AM

## 2019-08-09 LAB — PHOSPHORUS: Phosphorus: 2 mg/dL — ABNORMAL LOW (ref 2.5–4.6)

## 2019-08-09 LAB — CBC
HCT: 26 % — ABNORMAL LOW (ref 39.0–52.0)
Hemoglobin: 8.5 g/dL — ABNORMAL LOW (ref 13.0–17.0)
MCH: 32.3 pg (ref 26.0–34.0)
MCHC: 32.7 g/dL (ref 30.0–36.0)
MCV: 98.9 fL (ref 80.0–100.0)
Platelets: 129 10*3/uL — ABNORMAL LOW (ref 150–400)
RBC: 2.63 MIL/uL — ABNORMAL LOW (ref 4.22–5.81)
RDW: 13.9 % (ref 11.5–15.5)
WBC: 8.2 10*3/uL (ref 4.0–10.5)
nRBC: 0 % (ref 0.0–0.2)

## 2019-08-09 LAB — BASIC METABOLIC PANEL
Anion gap: 6 (ref 5–15)
BUN: 24 mg/dL — ABNORMAL HIGH (ref 8–23)
CO2: 21 mmol/L — ABNORMAL LOW (ref 22–32)
Calcium: 7.3 mg/dL — ABNORMAL LOW (ref 8.9–10.3)
Chloride: 112 mmol/L — ABNORMAL HIGH (ref 98–111)
Creatinine, Ser: 1.5 mg/dL — ABNORMAL HIGH (ref 0.61–1.24)
GFR calc Af Amer: 47 mL/min — ABNORMAL LOW (ref >60)
GFR calc non Af Amer: 41 mL/min — ABNORMAL LOW (ref >60)
Glucose, Bld: 107 mg/dL — ABNORMAL HIGH (ref 70–99)
Potassium: 3.5 mmol/L (ref 3.5–5.1)
Sodium: 139 mmol/L (ref 135–145)

## 2019-08-09 LAB — PROTIME-INR
INR: 2.1 — ABNORMAL HIGH (ref 0.8–1.2)
Prothrombin Time: 23 seconds — ABNORMAL HIGH (ref 11.4–15.2)

## 2019-08-09 LAB — MAGNESIUM: Magnesium: 2 mg/dL (ref 1.7–2.4)

## 2019-08-09 MED ORDER — SPIRITUS FRUMENTI
1.0000 | Freq: Once | ORAL | Status: AC
  Administered 2019-08-09: 1 via ORAL
  Filled 2019-08-09 (×2): qty 1

## 2019-08-09 MED ORDER — ATORVASTATIN CALCIUM 80 MG PO TABS
80.0000 mg | ORAL_TABLET | Freq: Every day | ORAL | Status: DC
  Administered 2019-08-09 – 2019-08-14 (×6): 80 mg via ORAL
  Filled 2019-08-09 (×6): qty 1

## 2019-08-09 MED ORDER — ADULT MULTIVITAMIN W/MINERALS CH
1.0000 | ORAL_TABLET | Freq: Every day | ORAL | Status: DC
  Administered 2019-08-10 – 2019-08-15 (×6): 1 via ORAL
  Filled 2019-08-09 (×6): qty 1

## 2019-08-09 MED ORDER — APIXABAN 5 MG PO TABS
10.0000 mg | ORAL_TABLET | Freq: Two times a day (BID) | ORAL | Status: DC
  Administered 2019-08-09 – 2019-08-10 (×4): 10 mg via ORAL
  Filled 2019-08-09 (×5): qty 2

## 2019-08-09 MED ORDER — HEPARIN (PORCINE) 25000 UT/250ML-% IV SOLN: 900.0000 [IU]/h | INTRAVENOUS | Status: DC

## 2019-08-09 MED ORDER — APIXABAN 5 MG PO TABS: 5.0000 mg | ORAL_TABLET | Freq: Two times a day (BID) | ORAL | Status: DC

## 2019-08-09 MED ORDER — ENSURE ENLIVE PO LIQD
237.0000 mL | Freq: Two times a day (BID) | ORAL | Status: DC
  Administered 2019-08-09 – 2019-08-15 (×11): 237 mL via ORAL

## 2019-08-09 NOTE — Progress Notes (Signed)
Occupational Therapy Treatment Patient Details Name: Cesar [name] MRN: [id number] DOB: 24-Dec-1928 Today's Date: 08/09/2019    History of present illness Pt admit for SUPERIOR MESENTERIC ARTERY THROMBECTOMY and Patch Angioplasty of superior messenteric artery with RIGHT femoral vein.  Post op afib.    OT comments  Pt progressing towards acute OT goals. Limited by feeling "swimmy-headed" during OT session. BP assessed at start of session: 100/57; end of session in supine: 128/65. HR mid 90s to low 100s throughout session including at start of session sitting in recliner. O2 80-94 on RA. Pt reports swimmy-headedness better but not completely resolved once in supine. Spouse present throughout session. D/c plan remains appropriate.    Follow Up Recommendations  Home health OT;Supervision/Assistance - 24 hour    Equipment Recommendations  None recommended by OT    Recommendations for Other Services      Precautions / Restrictions Precautions Precautions: Fall Precaution Comments: watch HR- Afib Restrictions Weight Bearing Restrictions: No       Mobility Bed Mobility Overal bed mobility: Needs Assistance Bed Mobility: Sit to Supine       Sit to supine: Min guard   General bed mobility comments: min guard for safety especially with managing lines.   Transfers Overall transfer level: Needs assistance Equipment used: Rolling walker (2 wheeled) Transfers: Sit to/from [facility] Sit to Stand: Mod assist;Min assist Stand pivot transfers: Min assist;Mod assist       General transfer comment: assist to steady and control descent. Impulsive and decreased awareness of lines. recliner>EOB. Pt reporting feeling "swimmyheaded" throughout session.     Balance Overall balance assessment: Needs assistance Sitting-balance support: Feet supported;No upper extremity supported Sitting balance-Leahy Scale: Fair     Standing balance support: During functional  activity Standing balance-Leahy Scale: Poor Standing balance comment: relies on UE support and external support                           ADL either performed or assessed with clinical judgement   ADL Overall ADL's : Needs assistance/impaired                         Toilet Transfer: Stand-pivot;Moderate assistance Toilet Transfer Details (indicate cue type and reason): simulated recliner to EOB           General ADL Comments: pt limited by feeling "swimmy-headed." Received in recliner. Transfered to bed.     Vision       Perception     Praxis      Cognition Arousal/Alertness: Awake/alert Behavior During Therapy: Flat affect;Impulsive Overall Cognitive Status: Impaired/Different from baseline Area of Impairment: Attention;Safety/judgement;Problem solving;Awareness                   Current Attention Level: Sustained   Following Commands: Follows one step commands consistently;Follows one step commands with increased time;Follows multi-step commands inconsistently Safety/Judgement: Decreased awareness of safety;Decreased awareness of deficits Awareness: Emergent Problem Solving: Slow processing;Requires verbal cues General Comments: Impulsive, decreased safety awareness.        Exercises     Shoulder Instructions       General Comments Spouse present throughout session.     Pertinent Vitals/ Pain       Pain Assessment: Faces Faces Pain Scale: Hurts little more Pain Location: abdomen Pain Descriptors / Indicators: Grimacing;Guarding;Sore Pain Intervention(s): Limited activity within patient's tolerance;Monitored during session;Repositioned  Home Living  Prior Functioning/Environment              Frequency  Min 2X/week        Progress Toward Goals  OT Goals(current goals can now be found in the care plan section)  Progress towards OT goals: Progressing  toward goals  Acute Rehab OT Goals Patient Stated Goal: to get home  OT Goal Formulation: With patient Time For Goal Achievement: 08/21/19 Potential to Achieve Goals: Good ADL Goals Pt Will Perform Grooming: with supervision;sitting;standing Pt Will Perform Upper Body Bathing: with supervision;sitting Pt Will Perform Lower Body Dressing: with supervision;with adaptive equipment;with caregiver independent in assisting;sit to/from stand Pt Will Transfer to Toilet: with supervision;bedside commode;stand pivot transfer;ambulating Pt Will Perform Toileting - Clothing Manipulation and hygiene: with supervision;sit to/from stand Additional ADL Goal #1: Patient will complete bed mobility with supervision as precursor to ADLs.  Plan Discharge plan remains appropriate    Co-evaluation                 AM-PAC OT "6 Clicks" Daily Activity     Outcome Measure   Help from another person eating meals?: None Help from another person taking care of personal grooming?: A Little Help from another person toileting, which includes using toliet, bedpan, or urinal?: A Lot Help from another person bathing (including washing, rinsing, drying)?: A Lot Help from another person to put on and taking off regular upper body clothing?: A Little Help from another person to put on and taking off regular lower body clothing?: A Lot 6 Click Score: 16    End of Session Equipment Utilized During Treatment: Rolling walker;Gait belt  OT Visit Diagnosis: Other abnormalities of gait and mobility (R26.89);Muscle weakness (generalized) (M62.81);Pain   Activity Tolerance Other (comment)("swimmy-headed" throughout session)   Patient Left in bed;with call bell/phone within reach;with bed alarm set;with family/visitor present   Nurse Communication Mobility status;Other (comment)("swimmy-headed")        Time: [id number] OT Time Calculation (min): 22 min  Charges: OT General Charges $OT Visit: 1 Visit OT  Treatments $Self Care/Home Management : 8-22 mins  Tyrone Schimke, OT Acute Rehabilitation Services Pager: [phone] Office: [phone]    Hortencia Pilar 08/09/2019, 1:14 PM

## 2019-08-09 NOTE — Progress Notes (Signed)
   VASCULAR SURGERY ASSESSMENT & PLAN:   POD 3 -SMA EMBOLECTOMY WITH VEIN PATCH ANGIOPLASTY:Doing well.  Tolerated liquids yesterday.  I will advance his diet to   CARDIAC: He has atrial fibrillation and the rate is now well controlled.  He is off IV Cardizem .   I have started his Eliquis.  ANTICOAGULATION:  Pharmacy will discontinue his heparin once his Eliquis is started.  GI/NUTRITION:Advance to full liquid diet.  ID: Continue cefotetan for now.   SUBJECTIVE:   No complaints.  Tolerating liquids.  Moved his bowels.  PHYSICAL EXAM:   Vitals:   08/08/19 2130 08/08/19 2200 08/09/19 0000 08/09/19 0400  BP: 128/86 100/88 103/75 124/80  Pulse: 95 98 92 (!) 101  Resp: (!) 26 16 15 14   Temp: 98.2 F (36.8 C) 97.7 F (36.5 C)    TempSrc: Oral Oral    SpO2: 98% 96% 95% 98%  Weight:      Height:       Abdomen with normal pitched bowel sounds. No focal tenderness. His incision looks fine.  LABS:   Lab Results  Component Value Date   WBC 8.2 08/09/2019   HGB 8.5 (L) 08/09/2019   HCT 26.0 (L) 08/09/2019   MCV 98.9 08/09/2019   PLT 129 (L) 08/09/2019   Lab Results  Component Value Date   CREATININE 1.50 (H) 08/09/2019   Lab Results  Component Value Date   INR 2.0 (H) 08/08/2019    PROBLEM LIST:    Active Problems:   Superior mesenteric artery thrombosis (HCC)   Mesenteric artery thrombosis (HCC)   CURRENT MEDS:   . sodium chloride   Intravenous Once  . Chlorhexidine Gluconate Cloth  6 each Topical Daily  . docusate  100 mg Per Tube Daily  . metoprolol tartrate  25 mg Oral BID  . pantoprazole sodium  40 mg Per Tube Daily    [name] Office: [phone] 08/09/2019

## 2019-08-09 NOTE — Progress Notes (Signed)
Physical Therapy Treatment Patient Details Name: Cesar [name] MRN: [id number] DOB: [birth year] Today's Date: 08/09/2019    History of Present Illness Pt admit for SUPERIOR MESENTERIC ARTERY THROMBECTOMY and Patch Angioplasty of superior messenteric artery with RIGHT femoral vein.  Post op afib.     PT Comments    Patient seen for mobility progression. Pt requires min/mod A for bed mobility and min A for functional transfer and gait training this session. Pt tolerated gait distance of ~75 ft with RW and standing rest breaks due to DOE. Continue to progress as tolerated.     Follow Up Recommendations  Home health PT;Supervision/Assistance - 24 hour     Equipment Recommendations  None recommended by PT    Recommendations for Other Services       Precautions / Restrictions Precautions Precautions: Fall Precaution Comments: watch HR- Afib Restrictions Weight Bearing Restrictions: No    Mobility  Bed Mobility Overal bed mobility: Needs Assistance Bed Mobility: Sit to Supine;Supine to Sit     Supine to sit: Mod assist Sit to supine: Min assist   General bed mobility comments: cues for sequencing; mod A to elevate trunk into sitting and min A to bring bilat LE into bed  Transfers Overall transfer level: Needs assistance Equipment used: Rolling walker (2 wheeled) Transfers: Sit to/from Stand Sit to Stand: Min assist;From elevated surface Stand pivot transfers: Min assist;Mod assist       General transfer comment: assist to power up and to steady; cues for safe hand placement  Ambulation/Gait Ambulation/Gait assistance: Min assist Gait Distance (Feet): 75 Feet Assistive device: Rolling walker (2 wheeled) Gait Pattern/deviations: Step-through pattern;Decreased step length - right;Decreased step length - left;Trunk flexed Gait velocity: decreased   General Gait Details: cues for upright posture, maintaining safe proximity to RW, and PLB   Stairs              Wheelchair Mobility    Modified Rankin (Stroke Patients Only)       Balance Overall balance assessment: Needs assistance Sitting-balance support: Feet supported;No upper extremity supported Sitting balance-Leahy Scale: Fair     Standing balance support: During functional activity Standing balance-Leahy Scale: Poor Standing balance comment: relies on UE support and external support                            Cognition Arousal/Alertness: Awake/alert Behavior During Therapy: Flat affect;Impulsive Overall Cognitive Status: Impaired/Different from baseline Area of Impairment: Attention;Safety/judgement;Problem solving;Awareness                   Current Attention Level: Sustained   Following Commands: Follows one step commands consistently;Follows one step commands with increased time;Follows multi-step commands inconsistently Safety/Judgement: Decreased awareness of safety;Decreased awareness of deficits Awareness: Emergent Problem Solving: Slow processing;Requires verbal cues General Comments: Impulsive, decreased safety awareness.      Exercises      General Comments General comments (skin integrity, edema, etc.): HR 110s at rest and up to 141 bpm while ambulating, SpO2 WNL on RA, RR into 30s      Pertinent Vitals/Pain Pain Assessment: Faces Faces Pain Scale: Hurts little more Pain Location: abdomen Pain Descriptors / Indicators: Grimacing;Guarding;Sore Pain Intervention(s): Monitored during session;Limited activity within patient's tolerance;Repositioned    Home Living                      Prior Function  PT Goals (current goals can now be found in the care plan section) Acute Rehab PT Goals Patient Stated Goal: to get home  Progress towards PT goals: Progressing toward goals    Frequency    Min 3X/week      PT Plan Current plan remains appropriate    Co-evaluation              AM-PAC PT "6 Clicks"  Mobility   Outcome Measure  Help needed turning from your back to your side while in a flat bed without using bedrails?: A Little Help needed moving from lying on your back to sitting on the side of a flat bed without using bedrails?: A Lot Help needed moving to and from a bed to a chair (including a wheelchair)?: A Lot Help needed standing up from a chair using your arms (e.g., wheelchair or bedside chair)?: A Little Help needed to walk in hospital room?: A Little Help needed climbing 3-5 steps with a railing? : A Lot 6 Click Score: 15    End of Session Equipment Utilized During Treatment: Gait belt Activity Tolerance: Other (comment);Patient limited by fatigue(DOE) Patient left: in bed;with call bell/phone within reach;with bed alarm set Nurse Communication: Mobility status PT Visit Diagnosis: Muscle weakness (generalized) (M62.81);Unsteadiness on feet (R26.81)     Time: [id number] PT Time Calculation (min) (ACUTE ONLY): 30 min  Charges:  $Gait Training: 23-37 mins                     Cesar [name], PTA Acute Rehabilitation Services Pager: [phone] Office: [phone]     Darliss Cheney 08/09/2019, 4:49 PM

## 2019-08-09 NOTE — Progress Notes (Signed)
ANTICOAGULATION CONSULT NOTE - Follow Up Consult  Pharmacy Consult for heparin Indication: atrial fibrillation and SMA thrombosis  Labs: Recent Labs    08/07/19 0313  08/07/19 1604 08/08/19 0034 08/08/19 0416 08/08/19 0417 08/09/19 0500  HGB 11.6*  --   --   --  10.6*  --  8.5*  HCT 35.7*  --   --   --  32.7*  --  26.0*  PLT 138*  --   --   --  142*  --  129*  LABPROT 22.0*  --   --   --  22.0*  --   --   INR 2.0*  --   --   --  2.0*  --   --   HEPARINUNFRC  --    < > 0.89* 0.43  --  0.31  --   CREATININE 1.43*  --   --   --  1.57*  --  1.50*   < > = values in this interval not displayed.    Assessment/Plan:  85yo male with afib on warfarin PTA currently on heparin for SMA thrombus s/p embolectomy. Vascular planning transition to apixaban today due to warfarin failure. Spoke with Dr. Scot Dock - will load apixaban and treat as new clot. Hg down to 8.5, plt down to 129, SCr stable at 1.5. No active bleed issues reported per discussion with RN.  Plan:  D/c heparin at time of 1st dose of apixaban - communicated plan with RN Apixaban 10mg  PO BID x 7 days; then 5mg  PO BID Monitor CBC, s/sx bleeding   Elicia Lamp, PharmD, BCPS Please check AMION for all [place] contact numbers Clinical Pharmacist 08/09/2019 8:19 AM

## 2019-08-09 NOTE — Discharge Instructions (Addendum)

## 2019-08-09 NOTE — Progress Notes (Signed)
Nutrition Follow-up  DOCUMENTATION CODES:   Not applicable  INTERVENTION:    Ensure Enlive po BID, each supplement provides 350 kcal and 20 grams of protein  Magic cup TID with meals, each supplement provides 290 kcal and 9 grams of protein  MVI daily  NUTRITION DIAGNOSIS:   Increased nutrient needs related to post-op healing as evidenced by estimated needs.  Ongoing  GOAL:   Patient will meet greater than or equal to 90% of their needs   Progressing PO  MONITOR:   Diet advancement, TF tolerance, Weight trends, Labs, I & O's, Skin  REASON FOR ASSESSMENT:   Rounds    ASSESSMENT:   Patient with PMH significant for AAA, aortic stenosis, thoracic ascending aortic aneurysm, CKD III, CVA, HOH, depression, and hypercholesterolemia. Presents this admission with SMA embolus.   11/1- embolectomy of SMA, vein patch angioplasty SMA  TPN not started as bowel function returned. Diet advanced to full liquids this am. Pt tolerated sips of liquid. Discussed the importance of protein intake for preservation of lean body mass. Pt willing to try Ensure and Magic Cup.   Admission weight: 99.8 kg- no recent weight obtained    I/O: +4,829 ml since admit UOP: 650 ml x 24 hrs   Medications: colace Labs: Phosphorus 2.0 (L) Cr 1.5 (baseline 1.4-1.5)   Diet Order:   Diet Order            Diet full liquid Room service appropriate? No; Fluid consistency: Thin  Diet effective now              EDUCATION NEEDS:   Not appropriate for education at this time  Skin:  Skin Assessment: Skin Integrity Issues: Skin Integrity Issues:: Incisions Incisions: R groin, abdomen  Last BM:  11/3  Height:   Ht Readings from Last 1 Encounters:  08/05/19 6\' 1"  (1.854 m)    Weight:   Wt Readings from Last 1 Encounters:  08/05/19 99.8 kg    Ideal Body Weight:  83.6 kg  BMI:  Body mass index is 29.03 kg/m.  Estimated Nutritional Needs:   Kcal:  2300-2500 kcal  Protein:  115-130  grams  Fluid:  >/= 2.3 L/day   Mariana Single RD, LDN Clinical Nutrition Pager # - [phone]

## 2019-08-09 NOTE — Progress Notes (Signed)
Progress Note  Patient Name: Cesar [name] Date of Encounter: 08/09/2019  Primary Cardiologist: [name], MD   Subjective   Cesar [name] reports that he is feeling well today, denies any chest pain, shortness of breath, headache, lightheadedness, or other symptoms.  Reports that his abdominal pain has improved.  He was able to tolerate his diet yesterday and reports having a bowel movement yesterday.  Was requesting to go home.  Inpatient Medications    Scheduled Meds: . sodium chloride   Intravenous Once  . Chlorhexidine Gluconate Cloth  6 each Topical Daily  . docusate  100 mg Per Tube Daily  . metoprolol tartrate  25 mg Oral BID  . pantoprazole sodium  40 mg Per Tube Daily   Continuous Infusions: . sodium chloride    . sodium chloride 125 mL/hr at 08/08/19 0655  . cefoTEtan (CEFOTAN) 1 GM IVPB (Mini-Bag Plus) 1 g (08/08/19 2122)  . diltiazem (CARDIZEM) infusion Stopped (08/07/19 0945)  . heparin 900 Units/hr (08/09/19 0658)  . magnesium sulfate bolus IVPB     PRN Meds: sodium chloride, acetaminophen **OR** acetaminophen, alum & mag hydroxide-simeth, guaiFENesin-dextromethorphan, hydrALAZINE, ipratropium-albuterol, labetalol, magnesium sulfate bolus IVPB, metoprolol tartrate, morphine injection, ondansetron, oxyCODONE-acetaminophen, phenol, potassium chloride   Vital Signs    Vitals:   08/08/19 2130 08/08/19 2200 08/09/19 0000 08/09/19 0400  BP: 128/86 100/88 103/75 124/80  Pulse: 95 98 92 (!) 101  Resp: (!) 26 16 15 14   Temp: 98.2 F (36.8 C) 97.7 F (36.5 C)    TempSrc: Oral Oral    SpO2: 98% 96% 95% 98%  Weight:      Height:        Intake/Output Summary (Last 24 hours) at 08/09/2019 0735 Last data filed at 08/08/2019 2052 Gross per 24 hour  Intake 740 ml  Output 650 ml  Net 90 ml   Filed Weights   08/05/19 1959  Weight: 99.8 kg    Telemetry    Atrial fibrillation- Personally Reviewed  ECG    Atrial fibrillation - Personally Reviewed   Physical Exam   GEN: No acute distress, sitting up in chair.   Neck: No JVD Cardiac: Irregular, 2/6 systolic murmur, no diastolic murmurs, rubs, or gallops Respiratory: Clear to auscultation bilaterally. GI: Soft, nontender, mildly distended, hypoactive bowel sounds, well healing midline surgical scar  MS: 1+ BL LE edema, no deformity. Neuro:  Nonfocal, hearing aids in place Psych: Normal affect   Labs    Chemistry Recent Labs  Lab 08/05/19 2001  08/06/19 0420  08/07/19 0313 08/08/19 0416 08/09/19 0500  NA 141   < > 139   < > 144 144 139  K 3.9   < > 4.1   < > 3.8 3.7 3.5  CL 106  --  108  --  117* 113* 112*  CO2 25  --  19*  --  19* 21* 21*  GLUCOSE 138*  --  205*  --  123* 115* 107*  BUN 26*  --  21  --  24* 26* 24*  CREATININE 1.53*  --  1.36*  --  1.43* 1.57* 1.50*  CALCIUM 8.9  --  7.9*  --  7.4* 7.5* 7.3*  PROT 7.4  --  5.7*  --   --  5.5*  --   ALBUMIN 3.7  --  2.8*  --   --  2.3*  --   AST 33  --  29  --   --  27  --  ALT 27  --  24  --   --  17  --   ALKPHOS 50  --  46  --   --  45  --   BILITOT 0.9  --  0.8  --   --  0.9  --   GFRNONAA 40*  --  46*  --  43* 39* 41*  GFRAA 46*  --  53*  --  50* 45* 47*  ANIONGAP 10  --  12  --  8 10 6    < > = values in this interval not displayed.     Hematology Recent Labs  Lab 08/07/19 0313 08/08/19 0416 08/09/19 0500  WBC 15.7* 13.4* 8.2  RBC 3.62* 3.29* 2.63*  HGB 11.6* 10.6* 8.5*  HCT 35.7* 32.7* 26.0*  MCV 98.6 99.4 98.9  MCH 32.0 32.2 32.3  MCHC 32.5 32.4 32.7  RDW 13.6 14.2 13.9  PLT 138* 142* 129*    Cardiac EnzymesNo results for input(s): TROPONINI in the last 168 hours. No results for input(s): TROPIPOC in the last 168 hours.   BNPNo results for input(s): BNP, PROBNP in the last 168 hours.   DDimer No results for input(s): DDIMER in the last 168 hours.   Radiology    No results found.  Cardiac Studies   Echo 07/31/19   1. Left ventricular ejection fraction, by visual estimation, is 60  to 65%. The left ventricle has normal function. Normal left ventricular size. There is moderately increased left ventricular hypertrophy. 2. Elevated mean left atrial pressure. 3. Global right ventricle has normal systolic function.The right ventricular size is mildly enlarged. 4. Left atrial size was severely dilated. 5. Right atrial size was severely dilated. 6. Moderate mitral annular calcification. 7. The mitral valve is normal in structure. Mild mitral valve regurgitation. No evidence of mitral stenosis. 8. The tricuspid valve is normal in structure. Tricuspid valve regurgitation is mild. 9. The aortic valve is tricuspid Aortic valve regurgitation is trivial by color flow Doppler. Moderate aortic valve stenosis. 10. The pulmonic valve was normal in structure. Pulmonic valve regurgitation is mild by color flow Doppler. 11. Aortic dilatation noted. 12. There is mild dilatation of the aortic root measuring 39 mm. 13. Normal LV systolic function; moderate LVH; mildly dilated aortic root; severe biatrial enlargement; mild RVE; moderate AS (mean gradient 33 mmHg) and trace AI; mild MR.  Patient Profile     85 y.o. 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   Assessment & Plan    1. Permanent atrial fibrillation: Patient remains in atrial fibrillation, currently rate controlled, started on metoprolol 25 mg BID yesterday for elevated heart rate. Remains on IV heparin drip, can transition to eliquis.  -Continue metoprolol 25 mg BID 2. Acute SMA embolism: S/p SMA embolectomy. Developed ileus following procedure, still has hypoactive bowel sounds, reports having bowel movmeents and passing gas. WBC now down to 8.2. Overall he is doing well from this standpoint, his white  count continues to improve.  3. Moderate aortic stenosis: Asymptomatic to date 4. Moderate aneurysm of ascending aorta: Stable. Conservative management planned, serial CT angiogram will no longer be performed.  5. Normocytic anemia: Hgb down to 8.5 today from 10.6 yesterday, he denies any issues with bleeding at this time. He is HD stable. Unclear etiology at this time, consider repeating CBC.  For questions or updates, please contact [place] Please consult [email or web address] for contact info under Cardiology/STEMI.  Signed, Asencion Noble, MD  08/09/2019, 7:35 AM    I have seen and examined the patient along with Asencion Noble, MD .  I have reviewed the chart, notes and new data.  I agree with her note.  Key new complaints: Less abdominal pain, preparing to eat more solid food, Key examination changes: Abdomen appears a little less distended, normal bowel sounds Key new findings / data: Roughly 2 g/dL drop in hemoglobin from yesterday might reflect remobilization of third space fluids as intestinal activity resumes.  No evidence of overt bleeding. Creatinine stable, at baseline.  PLAN: Plan to transition from intravenous heparin to oral anticoagulation with Eliquis when okay with vascular surgery. Monitor hemoglobin. Adequate ventricular rate control on low-dose beta-blocker.  Resume home statin.  No additional medical changes planned.  [name], MD, [place] [phone] 08/09/2019, 8:53 AM

## 2019-08-10 DIAGNOSIS — D62 Acute posthemorrhagic anemia: Secondary | ICD-10-CM

## 2019-08-10 DIAGNOSIS — I5033 Acute on chronic diastolic (congestive) heart failure: Secondary | ICD-10-CM

## 2019-08-10 LAB — CBC
HCT: 23.4 % — ABNORMAL LOW (ref 39.0–52.0)
Hemoglobin: 7.6 g/dL — ABNORMAL LOW (ref 13.0–17.0)
MCH: 32.1 pg (ref 26.0–34.0)
MCHC: 32.5 g/dL (ref 30.0–36.0)
MCV: 98.7 fL (ref 80.0–100.0)
Platelets: 149 10*3/uL — ABNORMAL LOW (ref 150–400)
RBC: 2.37 MIL/uL — ABNORMAL LOW (ref 4.22–5.81)
RDW: 14 % (ref 11.5–15.5)
WBC: 6.8 10*3/uL (ref 4.0–10.5)
nRBC: 0.3 % — ABNORMAL HIGH (ref 0.0–0.2)

## 2019-08-10 LAB — TYPE AND SCREEN
ABO/RH(D): A POS
Antibody Screen: NEGATIVE
Unit division: 0
Unit division: 0

## 2019-08-10 LAB — BPAM RBC
Blood Product Expiration Date: 202011272359
Blood Product Expiration Date: 202011282359
Unit Type and Rh: 6200
Unit Type and Rh: 6200

## 2019-08-10 LAB — HEMOGLOBIN AND HEMATOCRIT, BLOOD
HCT: 26.4 % — ABNORMAL LOW (ref 39.0–52.0)
Hemoglobin: 8.7 g/dL — ABNORMAL LOW (ref 13.0–17.0)

## 2019-08-10 LAB — PROTIME-INR
INR: 6.4 (ref 0.8–1.2)
INR: 6.1 (ref 0.8–1.2)
Prothrombin Time: 55.4 seconds — ABNORMAL HIGH (ref 11.4–15.2)
Prothrombin Time: 53.4 seconds — ABNORMAL HIGH (ref 11.4–15.2)

## 2019-08-10 MED ORDER — FUROSEMIDE 10 MG/ML IJ SOLN
40.0000 mg | Freq: Once | INTRAMUSCULAR | Status: AC
  Administered 2019-08-10: 40 mg via INTRAVENOUS
  Filled 2019-08-10: qty 4

## 2019-08-10 NOTE — Progress Notes (Signed)
CRITICAL VALUE ALERT  Critical Value: INR 6.4   Date & Time Notied:  0615 08/07/2019  Provider Notified: Dr. Scot Dock   Orders Received/Actions taken: Hold all blood thinners

## 2019-08-10 NOTE — Progress Notes (Signed)
Spoke with Michala RN re d/c CVC order. RN aware.

## 2019-08-10 NOTE — TOC Benefit Eligibility Note (Signed)
Transition of Care [facility]) Benefit Eligibility Note    Patient Details  Name: KELLER CUCCO MRN: [id number] Date of Birth: [birth year]   Medication/Dose: Eliquis 5mg  twice a day  Covered?: Yes  Tier: (N/A)  Prescription Coverage Preferred Pharmacy: N/A  Spoke with Person/Company/Phone Number:: Cristie Hem Mcarthur Rossetti [phone]  Co-Pay: Prior Auth Required  Prior Approval: Yes  Deductible: Unmet  Additional Notes: The number to call for a Prior Auth is [place] Phone Number: 08/10/2019, 8:55 AM

## 2019-08-10 NOTE — Care Management Important Message (Signed)
Important Message  Patient Details  Name: Cesar [name] MRN: [id number] Date of Birth: [birth year]   Medicare Important Message Given:  Yes     [name] 08/10/2019, 1:13 PM

## 2019-08-10 NOTE — Progress Notes (Signed)
   VASCULAR SURGERY ASSESSMENT & PLAN:   POD4-SMA EMBOLECTOMY WITH VEIN PATCH ANGIOPLASTY:Doing well.  Tolerated full liquids yesterday.  Advance to regular diet today.  He is moving his bowels.  CARDIAC: He has atrial fibrillation and the rate is now well controlled.  ANTICOAGULATION:  He was started on Eliquis yesterday.  His IV heparin was stopped.  His INR today is 6.4 today.  A follow-up INR is ordered for tomorrow.  GI/NUTRITION:Advance to regular diet.  ID: Continue cefotetan for now.  ACUTE BLOOD LOSS ANEMIA: Hemoglobin today is 7.6.  This has drifted down.  He has a follow-up CBC ordered for tomorrow..  DVT PROPHYLAXIS: He is being fully anticoagulated for his atrial fibrillation.   SUBJECTIVE:   No complaints.  Wants to go home.  He has been moving his bowels.  PHYSICAL EXAM:   Vitals:   08/09/19 1701 08/09/19 1930 08/10/19 0040 08/10/19 0500  BP: 136/83 123/73 105/69 124/74  Pulse: (!) 110 90 87 87  Resp: (!) 27 (!) 22 13 13   Temp:  98.6 F (37 C) 98 F (36.7 C) 97.7 F (36.5 C)  TempSrc:  Oral Oral Oral  SpO2: 94% 98% 95% 97%  Weight:      Height:       Abdomen slightly distended.  Normal pitched bowel sounds.  His incision looks fine. Lungs are clear.  LABS:   Lab Results  Component Value Date   WBC 6.8 08/10/2019   HGB 7.6 (L) 08/10/2019   HCT 23.4 (L) 08/10/2019   MCV 98.7 08/10/2019   PLT 149 (L) 08/10/2019   Lab Results  Component Value Date   CREATININE 1.50 (H) 08/09/2019   Lab Results  Component Value Date   INR 6.4 ([place]) 08/10/2019    PROBLEM LIST:    Active Problems:   Superior mesenteric artery thrombosis (HCC)   Mesenteric artery thrombosis (HCC)   CURRENT MEDS:   . sodium chloride   Intravenous Once  . apixaban  10 mg Oral BID   Followed by  . [START ON 08/16/2019] apixaban  5 mg Oral BID  . atorvastatin  80 mg Oral q1800  . Chlorhexidine Gluconate Cloth  6 each Topical Daily  . docusate  100 mg Per Tube  Daily  . feeding supplement (ENSURE ENLIVE)  237 mL Oral BID BM  . metoprolol tartrate  25 mg Oral BID  . multivitamin with minerals  1 tablet Oral Daily  . pantoprazole sodium  40 mg Per Tube Daily    [name] Office: [phone] 08/10/2019

## 2019-08-10 NOTE — Progress Notes (Addendum)
Progress Note  Patient Name: Cesar [name] Date of Encounter: 08/10/2019  Primary Cardiologist: [name], MD   Subjective   Mr. Hove reports that he is feeling well today.  Denies any abdominal pain, chest pain, shortness of breath, lightheadedness, or dizziness.  He was able to tolerate his liquid diet yesterday, reported having bowel movements.  Does state that he has been up and out of bed with no issues.  Inpatient Medications    Scheduled Meds: . sodium chloride   Intravenous Once  . apixaban  10 mg Oral BID   Followed by  . [START ON 08/16/2019] apixaban  5 mg Oral BID  . atorvastatin  80 mg Oral q1800  . Chlorhexidine Gluconate Cloth  6 each Topical Daily  . docusate  100 mg Per Tube Daily  . feeding supplement (ENSURE ENLIVE)  237 mL Oral BID BM  . metoprolol tartrate  25 mg Oral BID  . multivitamin with minerals  1 tablet Oral Daily  . pantoprazole sodium  40 mg Per Tube Daily   Continuous Infusions: . sodium chloride    . sodium chloride 125 mL/hr at 08/08/19 0655  . cefoTEtan (CEFOTAN) 1 GM IVPB (Mini-Bag Plus) 1 g (08/09/19 2152)  . diltiazem (CARDIZEM) infusion Stopped (08/07/19 0945)  . magnesium sulfate bolus IVPB     PRN Meds: sodium chloride, acetaminophen **OR** acetaminophen, alum & mag hydroxide-simeth, guaiFENesin-dextromethorphan, hydrALAZINE, ipratropium-albuterol, labetalol, magnesium sulfate bolus IVPB, metoprolol tartrate, morphine injection, ondansetron, oxyCODONE-acetaminophen, phenol, potassium chloride   Vital Signs    Vitals:   08/09/19 1701 08/09/19 1930 08/10/19 0040 08/10/19 0500  BP: 136/83 123/73 105/69 124/74  Pulse: (!) 110 90 87 87  Resp: (!) 27 (!) 22 13 13   Temp:  98.6 F (37 C) 98 F (36.7 C) 97.7 F (36.5 C)  TempSrc:  Oral Oral Oral  SpO2: 94% 98% 95% 97%  Weight:      Height:        Intake/Output Summary (Last 24 hours) at 08/10/2019 0755 Last data filed at 08/09/2019 1700 Gross per 24 hour  Intake 480 ml   Output 700 ml  Net -220 ml   Filed Weights   08/05/19 1959  Weight: 99.8 kg    Telemetry    Atrial fibrillation, rate controlled- Personally Reviewed  ECG    Atrial fibrillation - Personally Reviewed  Physical Exam   GEN:  Elderly male, no acute distress, laying in bed Neck: Elevated JVD Cardiac: Irregular rate and rhythm, 2/6 systolic murmur, no diastolic murmurs, rubs, or gallops Respiratory: Mild increased work of breathing, bibasilar crackles GI: Soft, nontender, mildly distended, hypoactive bowel sounds, well healing midline surgical scar  MS: 1+ BL LE edema, no deformity. Neuro:  Nonfocal, hearing aids in place Psych: Normal affect   Labs    Chemistry Recent Labs  Lab 08/05/19 2001  08/06/19 0420  08/07/19 0313 08/08/19 0416 08/09/19 0500  NA 141   < > 139   < > 144 144 139  K 3.9   < > 4.1   < > 3.8 3.7 3.5  CL 106  --  108  --  117* 113* 112*  CO2 25  --  19*  --  19* 21* 21*  GLUCOSE 138*  --  205*  --  123* 115* 107*  BUN 26*  --  21  --  24* 26* 24*  CREATININE 1.53*  --  1.36*  --  1.43* 1.57* 1.50*  CALCIUM 8.9  --  7.9*  --  7.4* 7.5* 7.3*  PROT 7.4  --  5.7*  --   --  5.5*  --   ALBUMIN 3.7  --  2.8*  --   --  2.3*  --   AST 33  --  29  --   --  27  --   ALT 27  --  24  --   --  17  --   ALKPHOS 50  --  46  --   --  45  --   BILITOT 0.9  --  0.8  --   --  0.9  --   GFRNONAA 40*  --  46*  --  43* 39* 41*  GFRAA 46*  --  53*  --  50* 45* 47*  ANIONGAP 10  --  12  --  8 10 6    < > = values in this interval not displayed.     Hematology Recent Labs  Lab 08/08/19 0416 08/09/19 0500 08/10/19 0500  WBC 13.4* 8.2 6.8  RBC 3.29* 2.63* 2.37*  HGB 10.6* 8.5* 7.6*  HCT 32.7* 26.0* 23.4*  MCV 99.4 98.9 98.7  MCH 32.2 32.3 32.1  MCHC 32.4 32.7 32.5  RDW 14.2 13.9 14.0  PLT 142* 129* 149*    Cardiac EnzymesNo results for input(s): TROPONINI in the last 168 hours. No results for input(s): TROPIPOC in the last 168 hours.   BNPNo results for  input(s): BNP, PROBNP in the last 168 hours.   DDimer No results for input(s): DDIMER in the last 168 hours.   Radiology    No results found.  Cardiac Studies   Echo 07/31/19   1. Left ventricular ejection fraction, by visual estimation, is 60 to 65%. The left ventricle has normal function. Normal left ventricular size. There is moderately increased left ventricular hypertrophy. 2. Elevated mean left atrial pressure. 3. Global right ventricle has normal systolic function.The right ventricular size is mildly enlarged. 4. Left atrial size was severely dilated. 5. Right atrial size was severely dilated. 6. Moderate mitral annular calcification. 7. The mitral valve is normal in structure. Mild mitral valve regurgitation. No evidence of mitral stenosis. 8. The tricuspid valve is normal in structure. Tricuspid valve regurgitation is mild. 9. The aortic valve is tricuspid Aortic valve regurgitation is trivial by color flow Doppler. Moderate aortic valve stenosis. 10. The pulmonic valve was normal in structure. Pulmonic valve regurgitation is mild by color flow Doppler. 11. Aortic dilatation noted. 12. There is mild dilatation of the aortic root measuring 39 mm. 13. Normal LV systolic function; moderate LVH; mildly dilated aortic root; severe biatrial enlargement; mild RVE; moderate AS (mean gradient 33 mmHg) and trace AI; mild MR.  Patient Profile     85 y.o. 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   Assessment & Plan    1. Permanent atrial fibrillation: Patient continues to be rate control in atrial fibrillation.  Had been started on metoprolol 25 mg twice daily which seems to have helped.  Had been transitioned from IV heparin to Eliquis yesterday, INR  today is 6.4, repeating in afternoon. He does show some signs of fluid overload on exam, does have increased work of breathing.  -Continue metoprolol 25 mg BID -Check weight, likely will need diuretics 2. Acute SMA embolism: S/p SMA embolectomy. Developed  ileus following procedure which has been improving. Advancing diet today. WBC now down to 8.2. Overall he is doing well from this standpoint, his white count continues to improve.  3. Moderate aortic stenosis: Asymptomatic to date 4. Moderate aneurysm of ascending aorta: Stable. Conservative management planned, serial CT angiogram will no longer be performed.  5. Normocytic anemia: Hgb down to 7.6 today, from 8.5 today. Continues to deny issues with bleeding, denies any symptoms of anemia. He remains HD stable.  INR elevated at 6.4. Has not been getting any warfarin since admission. -Repeat CBC and INR in afternoon   For questions or updates, please contact [place] Please consult [email or web address] for contact info under Cardiology/STEMI.  Signed, Asencion Noble, MD  08/10/2019, 7:55 AM    I have seen and examined the patient along with Asencion Noble, MD .  I have reviewed the chart, notes and new data.  I agree with her note.  Key new complaints: Although he denies dyspnea, he appears clearly tachypneic and has audible wheezing.  States that his appetite is good and denies any further problems with abdominal pain. Key examination changes: Respiratory rate is approximately 26-30, audible wheezing scattered throughout both lung fields, jugular venous pulsations roughly 9-10 cm, diffuse extremity edema Key new findings / data: Hemoglobin has slipped further down to 7.6 (was 8.5 yesterday,10.6 just 2 days ago,  and 14.4 on admission).  Surprising INR 6.4 (drawn before the morning dose of apixaban).  Has not received warfarin since admission, roughly 6 days ago.  PLAN: Has evidence of diastolic heart failure exacerbation, probably as  hemobilizes enteral fluids with resolution of ileus.  Will administer loop diuretic. Recheck hemoglobin and INR this afternoon. May need to consider CT abdomen.  Drop in hemoglobin is a little more than would be expected with fluid redistribution.  [name], MD, [place] [phone] 08/10/2019, 9:44 AM

## 2019-08-10 NOTE — Progress Notes (Signed)
Per insurance check on Eliquis- pt's insurance does not cover the drug- per [facility] drug plan- may cover with pre-auth - MD will need to call either-  [phone] or [phone]  For prior approval If approved they should be able to give a copay cost per month. If not approved then pt will have to pay total cost of the drug- which per conversation with the wife they will not be able to afford.

## 2019-08-11 LAB — CBC
HCT: 22.9 % — ABNORMAL LOW (ref 39.0–52.0)
Hemoglobin: 7.7 g/dL — ABNORMAL LOW (ref 13.0–17.0)
MCH: 32.2 pg (ref 26.0–34.0)
MCHC: 33.6 g/dL (ref 30.0–36.0)
MCV: 95.8 fL (ref 80.0–100.0)
Platelets: 158 10*3/uL (ref 150–400)
RBC: 2.39 MIL/uL — ABNORMAL LOW (ref 4.22–5.81)
RDW: 13.8 % (ref 11.5–15.5)
WBC: 8.2 10*3/uL (ref 4.0–10.5)
nRBC: 0.2 % (ref 0.0–0.2)

## 2019-08-11 MED ORDER — APIXABAN 5 MG PO TABS
5.0000 mg | ORAL_TABLET | Freq: Two times a day (BID) | ORAL | Status: DC
  Administered 2019-08-11 (×2): 5 mg via ORAL
  Filled 2019-08-11 (×2): qty 1

## 2019-08-11 MED ORDER — APIXABAN 5 MG PO TABS: 10.0000 mg | ORAL_TABLET | Freq: Two times a day (BID) | ORAL | Status: DC

## 2019-08-11 MED ORDER — FUROSEMIDE 10 MG/ML IJ SOLN
40.0000 mg | Freq: Once | INTRAMUSCULAR | Status: AC
  Administered 2019-08-11: 40 mg via INTRAVENOUS
  Filled 2019-08-11: qty 4

## 2019-08-11 MED ORDER — APIXABAN 5 MG PO TABS: 5.0000 mg | ORAL_TABLET | Freq: Two times a day (BID) | ORAL | Status: DC

## 2019-08-11 MED ORDER — OXYCODONE-ACETAMINOPHEN 5-325 MG PO TABS: 1.0000 | ORAL_TABLET | Freq: Four times a day (QID) | ORAL | 0 refills | Status: DC | PRN

## 2019-08-11 NOTE — Progress Notes (Signed)
ANTICOAGULATION CONSULT NOTE - Follow Up Consult  Pharmacy Consult for apixaban Indication: atrial fibrillation and SMA thrombosis  Labs: Recent Labs    08/09/19 0500 08/09/19 0949 08/10/19 0500 08/10/19 1406 08/11/19 0343  HGB 8.5*  --  7.6* 8.7* 7.7*  HCT 26.0*  --  23.4* 26.4* 22.9*  PLT 129*  --  149*  --  158  LABPROT  --  23.0* 55.4* 53.4*  --   INR  --  2.1* 6.4* 6.1*  --   CREATININE 1.50*  --   --   --   --     Assessment/Plan:  85yo male with afib on warfarin PTA currently on heparin for SMA thrombus s/p embolectomy. MD transitioned to apixaban due to warfarin failure. Spoke with Cardiology - no need to load, use 5mg  PO BID dose with SCr borderline at 1.5 (patient has been as low as 1.36 recently). Hg down to 7.7, plt up to 158. No active bleed issues reported.  Plan:  Continue apixaban 5mg  PO BID Monitor CBC, s/sx bleeding   Elicia Lamp, PharmD, BCPS Please check AMION for all [place] contact numbers Clinical Pharmacist 08/11/2019 10:31 AM

## 2019-08-11 NOTE — Progress Notes (Signed)
Contacted [employer] at [phone] regarding prior authorization for Eliquis 5 mg BID. Put in prior authorization request. Reference number is [id number]. Will fax information regarding request to 4E floor, [phone].

## 2019-08-11 NOTE — TOC Benefit Eligibility Note (Signed)
Transition of Care [facility]) Benefit Eligibility Note    Patient Details  Name: JHAIR FEICK MRN: [id number] Date of Birth: 1929/06/15   Medication/Dose: Eliquis  Covered?: Yes  Tier: (N/A)  Prescription Coverage Preferred Pharmacy: NA  Spoke with Person/Company/Phone Number:: [place]/[employer]/[phone]  Co-Pay: Under review could take two additional days  Prior Approval: Yes  Deductible: Unmet  Additional Notes: NA    Orbie Pyo Phone Number: 08/11/2019, 2:42 PM

## 2019-08-11 NOTE — Progress Notes (Signed)
Physical Therapy Treatment Patient Details Name: Cesar [name] MRN: [id number] DOB: 1929-06-26 Today's Date: 08/11/2019    History of Present Illness Pt admit for SUPERIOR MESENTERIC ARTERY THROMBECTOMY and Patch Angioplasty of superior messenteric artery with RIGHT femoral vein.  Post op afib.     PT Comments    Pt limited by fatigue during session, with audile wheezing throughout session. Pt continues to require physical assistance for bed mobility and transfers, although less assistance than previous sessions. Pt tolerates short bouts of ambulation, stopped by SOB and fatigue in LE. Pt will continue to benefit from PT POC to improve LE strength and endurance and reduce falls risk.   Follow Up Recommendations  Home health PT;Supervision/Assistance - 24 hour     Equipment Recommendations  None recommended by PT    Recommendations for Other Services       Precautions / Restrictions Precautions Precautions: Fall Precaution Comments: watch HR- Afib Restrictions Weight Bearing Restrictions: No    Mobility  Bed Mobility Overal bed mobility: Needs Assistance Bed Mobility: Supine to Sit     Supine to sit: Min assist;HOB elevated        Transfers Overall transfer level: Needs assistance Equipment used: Rolling walker (2 wheeled) Transfers: Sit to/from Stand Sit to Stand: Min assist         General transfer comment: pt performing transfers from bed, recliner, and commode during session  Ambulation/Gait Ambulation/Gait assistance: Min assist Gait Distance (Feet): 50 Feet(50', 45', 15') Assistive device: Rolling walker (2 wheeled) Gait Pattern/deviations: Step-to pattern;Decreased step length - right;Decreased step length - left;Shuffle;Trunk flexed Gait velocity: decreased Gait velocity interpretation: <1.8 ft/sec, indicate of risk for recurrent falls General Gait Details: pt with shortened step to gait pattern with reduced foot clearance and increased trunk  flexion. Pt able to stand with more erect posture when cued but reverts to flexed trunk during 2nd bout of ambulation   Stairs             Wheelchair Mobility    Modified Rankin (Stroke Patients Only)       Balance Overall balance assessment: Needs assistance Sitting-balance support: Feet supported;Single extremity supported Sitting balance-Leahy Scale: Good Sitting balance - Comments: supervision   Standing balance support: Bilateral upper extremity supported;During functional activity Standing balance-Leahy Scale: Fair Standing balance comment: minG-close supervision                            Cognition Arousal/Alertness: Awake/alert Behavior During Therapy: Flat affect;Impulsive Overall Cognitive Status: Impaired/Different from baseline Area of Impairment: Safety/judgement;Problem solving                         Safety/Judgement: Decreased awareness of safety;Decreased awareness of deficits   Problem Solving: Slow processing General Comments: Impulsive, decreased safety awareness.      Exercises      General Comments General comments (skin integrity, edema, etc.): Pt tachy in a-fib during session, HR ranging from 110-130 with brief peak of 141 when toileting. Pt SpO2 stable at 93% and above despite pt reports of increased work of breathing      Pertinent Vitals/Pain Pain Assessment: Faces Faces Pain Scale: Hurts little more Pain Location: abdomen Pain Descriptors / Indicators: Grimacing;Guarding;Sore Pain Intervention(s): Limited activity within patient's tolerance    Home Living                      Prior Function  PT Goals (current goals can now be found in the care plan section) Acute Rehab PT Goals Patient Stated Goal: to get home  Progress towards PT goals: Progressing toward goals    Frequency    Min 3X/week      PT Plan Current plan remains appropriate    Co-evaluation               AM-PAC PT "6 Clicks" Mobility   Outcome Measure  Help needed turning from your back to your side while in a flat bed without using bedrails?: A Little Help needed moving from lying on your back to sitting on the side of a flat bed without using bedrails?: A Lot Help needed moving to and from a bed to a chair (including a wheelchair)?: A Little Help needed standing up from a chair using your arms (e.g., wheelchair or bedside chair)?: A Little Help needed to walk in hospital room?: A Little Help needed climbing 3-5 steps with a railing? : A Lot 6 Click Score: 16    End of Session Equipment Utilized During Treatment: Gait belt Activity Tolerance: Patient limited by fatigue Patient left: in chair;with call bell/phone within reach;with family/visitor present Nurse Communication: Mobility status PT Visit Diagnosis: Muscle weakness (generalized) (M62.81);Unsteadiness on feet (R26.81)     Time: [id number] PT Time Calculation (min) (ACUTE ONLY): 24 min  Charges:  $Gait Training: 8-22 mins $Therapeutic Activity: 8-22 mins                     Zenaida Niece, PT, DPT Acute Rehabilitation Pager: [phone]    Zenaida Niece 08/11/2019, 1:03 PM

## 2019-08-11 NOTE — Plan of Care (Signed)
  Problem: Education: Goal: Knowledge of General Education information will improve Description Including pain rating scale, medication(s)/side effects and non-pharmacologic comfort measures Outcome: Progressing   Problem: Health Behavior/Discharge Planning: Goal: Ability to manage health-related needs will improve Outcome: Progressing   

## 2019-08-11 NOTE — Progress Notes (Addendum)
  Progress Note  POD5-SMA EMBOLECTOMY WITH VEIN PATCH ANGIOPLASTY:Doing well. Tolerated full liquids yesterday.  Advance to regular diet today.  His abdomen seems slightly distended.  He had no bowel movement yesterday.  We will try a Dulcolax suppository today.  CARDIAC: He has atrial fibrillation and the rate is now well controlled.  ANTICOAGULATION:He is on Eliquis now.  His INR was elevated yesterday.  However, I discussed this with pharmacy and INR can be significantly elevated and there is really no reason to check this.  Regardless, apparently his insurance does not cover Eliquis.  We will get cardiology's input on this.  GI/NUTRITION:Continue regular diet as tolerated.  ID: He will not need antibiotics at discharge.  ACUTE BLOOD LOSS ANEMIA: Hemoglobin today is 7.7, which is stable.    DVT PROPHYLAXIS: He is being fully anticoagulated for his atrial fibrillation.  [name], MD Office: [phone]   08/11/2019 7:46 AM 5 Days Post-Op  Subjective:  No complaints this morning.  Tolerated regular diet yesterday.  No nausea or vomiting.   Vitals:   08/11/19 0623 08/11/19 0636  BP:    Pulse: 92 91  Resp: (!) 24 18  Temp:    SpO2: 99% 98%   Physical Exam: Lungs:  Some wheezing but non labored Incisions:  Midline incision c/d/i Extremities:  Feet warm and well perfused Abdomen:  Distended some, soft, NT Neurologic: A&O  CBC    Component Value Date/Time   WBC 8.2 08/11/2019 0343   RBC 2.39 (L) 08/11/2019 0343   HGB 7.7 (L) 08/11/2019 0343   HCT 22.9 (L) 08/11/2019 0343   PLT 158 08/11/2019 0343   MCV 95.8 08/11/2019 0343   MCH 32.2 08/11/2019 0343   MCHC 33.6 08/11/2019 0343   RDW 13.8 08/11/2019 0343   LYMPHSABS 1.0 08/08/2019 0416   MONOABS 1.3 (H) 08/08/2019 0416   EOSABS 0.0 08/08/2019 0416   BASOSABS 0.0 08/08/2019 0416    BMET    Component Value Date/Time   NA 139 08/09/2019 0500   NA 139 07/10/2016   K 3.5 08/09/2019 0500   CL 112 (H) 08/09/2019 0500   CO2 21 (L) 08/09/2019 0500   GLUCOSE 107 (H) 08/09/2019 0500   BUN 24 (H) 08/09/2019 0500   BUN 20 07/10/2016   CREATININE 1.50 (H) 08/09/2019 0500   CALCIUM 7.3 (L) 08/09/2019 0500   GFRNONAA 41 (L) 08/09/2019 0500   GFRAA 47 (L) 08/09/2019 0500    INR    Component Value Date/Time   INR 6.1 (HH) 08/10/2019 1406     Intake/Output Summary (Last 24 hours) at 08/11/2019 0746 Last data filed at 08/11/2019 0000 Gross per 24 hour  Intake 360 ml  Output 2750 ml  Net -2390 ml     Assessment/Plan:  85 y.o. male is s/p SMA embolectomy and vein patch angioplasty 5 Days Post-Op   - Tolerating regular diet; bowels moving - Eliquis being held due to elevated INR; Eliquis will not be covered by insurance; will defer anticoag choice to Cardiology - H&H stable; daily CBC - Third spacing, continue diuresis, check CMP in am   Dagoberto Ligas, PA-C [facility] [phone] 08/11/2019 7:46 AM

## 2019-08-11 NOTE — Progress Notes (Signed)
Progress Note  Patient Name: Cesar [name] Date of Encounter: 08/11/2019  Primary Cardiologist: [name], MD   Subjective   Cesar [name] reports that he is feeling okay today, reports some mild abdominal pain. Denies any chest pain or shortness of breath. Has been tolerating diet.   Inpatient Medications    Scheduled Meds:  sodium chloride   Intravenous Once   atorvastatin  80 mg Oral q1800   Chlorhexidine Gluconate Cloth  6 each Topical Daily   docusate  100 mg Per Tube Daily   feeding supplement (ENSURE ENLIVE)  237 mL Oral BID BM   metoprolol tartrate  25 mg Oral BID   multivitamin with minerals  1 tablet Oral Daily   pantoprazole sodium  40 mg Per Tube Daily   Continuous Infusions:  sodium chloride     cefoTEtan (CEFOTAN) 1 GM IVPB (Mini-Bag Plus) 1 g (08/10/19 2131)   diltiazem (CARDIZEM) infusion Stopped (08/07/19 0945)   magnesium sulfate bolus IVPB     PRN Meds: sodium chloride, acetaminophen **OR** acetaminophen, alum & mag hydroxide-simeth, guaiFENesin-dextromethorphan, hydrALAZINE, ipratropium-albuterol, labetalol, magnesium sulfate bolus IVPB, metoprolol tartrate, morphine injection, ondansetron, oxyCODONE-acetaminophen, phenol, potassium chloride   Vital Signs    Vitals:   08/11/19 0600 08/11/19 0621 08/11/19 0623 08/11/19 0636  BP:      Pulse: (!) 112 (!) 101 92 91  Resp: (!) 24 (!) 26 (!) 24 18  Temp:      TempSrc:      SpO2: 99% 97% 99% 98%  Weight:      Height:        Intake/Output Summary (Last 24 hours) at 08/11/2019 0830 Last data filed at 08/11/2019 0000 Gross per 24 hour  Intake 240 ml  Output 2750 ml  Net -2510 ml   Filed Weights   08/10/19 0854 08/11/19 0505 08/11/19 0559  Weight: 103.8 kg 104.6 kg 102.2 kg    Telemetry    Atrial fibrillation with PVCs - Personally Reviewed  ECG    Non today  Physical Exam   GEN: No acute distress. Elderly male.  Neck: elevated JVD Cardiac: Irregular rhythm, systolic  murmur, no rubs, or gallops.  Respiratory: Normal work of breathing, bibasilar crackles GI: Soft, minimal distention and tenderness, well healing midline surgical scar  MS: 1+ BL LE edema; No deformity. Neuro:  Nonfocal  Psych: Normal affect   Labs    Chemistry Recent Labs  Lab 08/05/19 2001  08/06/19 0420  08/07/19 0313 08/08/19 0416 08/09/19 0500  NA 141   < > 139   < > 144 144 139  K 3.9   < > 4.1   < > 3.8 3.7 3.5  CL 106  --  108  --  117* 113* 112*  CO2 25  --  19*  --  19* 21* 21*  GLUCOSE 138*  --  205*  --  123* 115* 107*  BUN 26*  --  21  --  24* 26* 24*  CREATININE 1.53*  --  1.36*  --  1.43* 1.57* 1.50*  CALCIUM 8.9  --  7.9*  --  7.4* 7.5* 7.3*  PROT 7.4  --  5.7*  --   --  5.5*  --   ALBUMIN 3.7  --  2.8*  --   --  2.3*  --   AST 33  --  29  --   --  27  --   ALT 27  --  24  --   --  17  --   ALKPHOS 50  --  46  --   --  45  --   BILITOT 0.9  --  0.8  --   --  0.9  --   GFRNONAA 40*  --  46*  --  43* 39* 41*  GFRAA 46*  --  53*  --  50* 45* 47*  ANIONGAP 10  --  12  --  8 10 6    < > = values in this interval not displayed.     Hematology Recent Labs  Lab 08/09/19 0500 08/10/19 0500 08/10/19 1406 08/11/19 0343  WBC 8.2 6.8  --  8.2  RBC 2.63* 2.37*  --  2.39*  HGB 8.5* 7.6* 8.7* 7.7*  HCT 26.0* 23.4* 26.4* 22.9*  MCV 98.9 98.7  --  95.8  MCH 32.3 32.1  --  32.2  MCHC 32.7 32.5  --  33.6  RDW 13.9 14.0  --  13.8  PLT 129* 149*  --  158    Cardiac EnzymesNo results for input(s): TROPONINI in the last 168 hours. No results for input(s): TROPIPOC in the last 168 hours.   BNPNo results for input(s): BNP, PROBNP in the last 168 hours.   DDimer No results for input(s): DDIMER in the last 168 hours.   Radiology    No results found.  Cardiac Studies   Echo 07/31/19   1. Left ventricular ejection fraction, by visual estimation, is 60 to 65%. The left ventricle has normal function. Normal left ventricular size. There is moderately increased  left ventricular hypertrophy. 2. Elevated mean left atrial pressure. 3. Global right ventricle has normal systolic function.The right ventricular size is mildly enlarged. 4. Left atrial size was severely dilated. 5. Right atrial size was severely dilated. 6. Moderate mitral annular calcification. 7. The mitral valve is normal in structure. Mild mitral valve regurgitation. No evidence of mitral stenosis. 8. The tricuspid valve is normal in structure. Tricuspid valve regurgitation is mild. 9. The aortic valve is tricuspid Aortic valve regurgitation is trivial by color flow Doppler. Moderate aortic valve stenosis. 10. The pulmonic valve was normal in structure. Pulmonic valve regurgitation is mild by color flow Doppler. 11. Aortic dilatation noted. 12. There is mild dilatation of the aortic root measuring 39 mm. 13. Normal LV systolic function; moderate LVH; mildly dilated aortic root; severe biatrial enlargement; mild RVE; moderate AS (mean gradient 33 mmHg) and trace AI; mild MR.  Patient Profile     85 y.o. 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   Assessment & Plan    1. Permanent atrial fibrillation: Patient continues to be rate control in atrial fibrillation.  Had been started on metoprolol 25 mg twice daily which seems to have helped.  Currently on eliquis. INR was elevated yesterday at 6.4 and 6.1.  -Continue metoprolol 25 mg BID  2. Volume overload: No history of HF. He seemed to have fluid overload yesterday, weight up to to 103 kg from 99 kg on admission. He continues to have signs of fluid overload on exam, work of breathing has improved.  -Continue diuresis with lasix -Daily weights -I+Os  3. Acute SMA embolism: S/p SMA embolectomy. Developed ileus  following procedure which has been improving.Continues to improve  4. Moderate aortic stenosis: Asymptomatic to date 5. Moderate aneurysm of ascending aorta:  Stable. Conservative management planned, serial CT angiogram will no longer be performed.  6. Normocytic anemia: Hgb stable at 7.7 from 7.6 yesterday, from 8.5 today. Continues to deny issues with bleeding, denies any symptoms of anemia. He remains HD stable. Continue to monitor.   For questions or updates, please contact [place] Please consult [email or web address] for contact info under Cardiology/STEMI.      Signed, Asencion Noble, MD  08/11/2019, 8:30 AM     I have seen and examined the patient along with Asencion Noble, MD.  I have reviewed the chart, notes and new data.  I agree with PA/NP's note.  Key new complaints: Feels better today.  Denies abdominal pain.  Has not walked yet.  Never complained of shortness of breath, but is clearly less tachypneic today and looks comfortable. Key examination changes: Irregular rhythm, well rate controlled.  Jugular venous distention is mild.  Mild edema of the extremities.  Normal bowel sounds. Key new findings / data: Atrial fibrillation with ventricular rate in the 80s on telemetry.  Hemoglobin stable approximately 7.6-8.7 over the last couple of days.  PLAN: Plan to continue anticoagulation with Eliquis, but apparently we need to contact the insurance company for prior authorization (surprising since the indication is nonvalvular atrial fibrillation).  He has failed warfarin anticoagulation with an episode of systemic embolism while the INR was therapeutic. Continue diuresis.  He remains roughly 4-5 pounds "ahead" from admission and his usual weight of 219-220 lb.  [name], MD, [place] [phone] 08/11/2019, 10:05 AM

## 2019-08-11 NOTE — Progress Notes (Signed)
Pt stood to assess daily weight. Pt needed assistance to stand. Once up, pt used walker to assist himself onto the scale. Pt says he hasn't been up much. Pt tolerated standing well. Breathing unlabored and oxygen saturation at 98% even though audible expiratory wheezes can be heard. Pt denies SOB or any distress. Pt made comfortable back in bed after dangling on the side. Will continue to monitor. Lajoyce Corners, RN

## 2019-08-11 NOTE — TOC Initial Note (Signed)
Transition of Care (TOC) - Initial/Assessment Note  Marvetta Gibbons RN, BSN Transitions of Care Unit 4E- RN Case Manager [phone]   Patient Details  Name: Cesar [name] MRN: [id number] Date of Birth: 1929-06-28  Transition of Care [facility]) CM/SW Contact:    Dawayne Patricia, RN Phone Number: 08/11/2019, 3:12 PM  Clinical Narrative:                 Spoke with pt's wife regarding transition of care needs HH and DME- per wife pt has needed DME at home including RW, cane, shower seat. Has used [facility] in past - however may consider a different agency this time- list provided to wife for review Per CMS guidelines from medicare.gov website with star ratings (copy placed in shadow chart)- also discussed Eliquis non coverage with insurance- and need for prior auth- note placed in chart for MD with # to call for pre-auth with insurance. Wife states they can not afford out of pocket cost. Also discussed the 2 drug plans found in Epic- per wife only aware of one plan that they use which is the [place] provided to wife on the plans found in Epic so that she and the daughter can f/u on the [employer] plan that they do not use.  Pt will need HHPT/OT orders placed prior to discharge, CM will f/u on [facility] agency choice for [facility] referral.   Expected Discharge Plan: [place] Barriers to Discharge: Continued Medical Work up   Patient Goals and CMS Choice Patient states their goals for this hospitalization and ongoing recovery are:: return home CMS Medicare.gov Compare Post Acute Care list provided to:: Patient Choice offered to / list presented to : Patient, Spouse  Expected Discharge Plan and Services Expected Discharge Plan: [place]   Discharge Planning Services: CM Consult Post Acute Care Choice: [place] arrangements for the past 2 months: Single Family Home                                      Prior Living  Arrangements/Services Living arrangements for the past 2 months: Single Family Home Lives with:: Spouse Patient language and need for interpreter reviewed:: Yes        Need for Family Participation in Patient Care: Yes (Comment) Care giver support system in place?: Yes (comment) Current home services: DME Criminal Activity/Legal Involvement Pertinent to Current Situation/Hospitalization: No - Comment as needed  Activities of Daily Living Home Assistive Devices/Equipment: None ADL Screening (condition at time of admission) Patient's cognitive ability adequate to safely complete daily activities?: Yes Is the patient deaf or have difficulty hearing?: Yes Does the patient have difficulty seeing, even when wearing glasses/contacts?: No Does the patient have difficulty concentrating, remembering, or making decisions?: No Patient able to express need for assistance with ADLs?: Yes Does the patient have difficulty dressing or bathing?: No Independently performs ADLs?: Yes (appropriate for developmental age) Does the patient have difficulty walking or climbing stairs?: No Weakness of Legs: None Weakness of Arms/Hands: None  Permission Sought/Granted                  Emotional Assessment Appearance:: Appears stated age Attitude/Demeanor/Rapport: Engaged(HOH) Affect (typically observed): Appropriate Orientation: : Oriented to Self, Oriented to Place, Oriented to  Time, Oriented to Situation   Psych Involvement: No (comment)  Admission diagnosis:  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 Acute ischemic stroke ([place]) 03/15/2019  . PAD (peripheral artery disease) ([place]) 07/23/2018  . Ischemia of lower extremity  07/12/2016  . Acute blood loss anemia 07/12/2016  . Depression 07/12/2016  . BPH (benign prostatic hyperplasia) 07/12/2016  . AAA (abdominal aortic aneurysm) ([place])   . Atrial fibrillation ([place])   . Embolism ([place]) 07/02/2016  . CKD (chronic kidney disease), stage III ([place])   . Claudication ([place]) 06/04/2016  . Long term current use of anticoagulant 06/04/2016  . Abnormal ECG 06/04/2016  . Thoracic ascending aortic aneurysm ([place]) 04/22/2015  . Permanent atrial fibrillation ([place]) 05/11/2013  . Mild aortic stenosis 05/11/2013  . Orthostatic hypotension 05/11/2013  . Hypercholesterolemia 05/11/2013   PCP:  Orpah Melter, MD Pharmacy:   [place], [place] - [id number] [place] [address] Phone: [phone] Fax: [place] [address] [place] [address] Phone: [phone] Fax: [phone]     Social Determinants of Health (SDOH) Interventions    Readmission Risk Interventions No flowsheet data found.

## 2019-08-12 LAB — COMPREHENSIVE METABOLIC PANEL
ALT: 26 U/L (ref 0–44)
AST: 40 U/L (ref 15–41)
Albumin: 2 g/dL — ABNORMAL LOW (ref 3.5–5.0)
Alkaline Phosphatase: 52 U/L (ref 38–126)
Anion gap: 7 (ref 5–15)
BUN: 25 mg/dL — ABNORMAL HIGH (ref 8–23)
CO2: 25 mmol/L (ref 22–32)
Calcium: 7.7 mg/dL — ABNORMAL LOW (ref 8.9–10.3)
Chloride: 106 mmol/L (ref 98–111)
Creatinine, Ser: 1.61 mg/dL — ABNORMAL HIGH (ref 0.61–1.24)
GFR calc Af Amer: 44 mL/min — ABNORMAL LOW (ref >60)
GFR calc non Af Amer: 38 mL/min — ABNORMAL LOW (ref >60)
Glucose, Bld: 112 mg/dL — ABNORMAL HIGH (ref 70–99)
Potassium: 3.6 mmol/L (ref 3.5–5.1)
Sodium: 138 mmol/L (ref 135–145)
Total Bilirubin: 1.6 mg/dL — ABNORMAL HIGH (ref 0.3–1.2)
Total Protein: 4.8 g/dL — ABNORMAL LOW (ref 6.5–8.1)

## 2019-08-12 LAB — CBC
HCT: 23.4 % — ABNORMAL LOW (ref 39.0–52.0)
Hemoglobin: 7.9 g/dL — ABNORMAL LOW (ref 13.0–17.0)
MCH: 32.4 pg (ref 26.0–34.0)
MCHC: 33.8 g/dL (ref 30.0–36.0)
MCV: 95.9 fL (ref 80.0–100.0)
Platelets: 176 10*3/uL (ref 150–400)
RBC: 2.44 MIL/uL — ABNORMAL LOW (ref 4.22–5.81)
RDW: 14.3 % (ref 11.5–15.5)
WBC: 8.8 10*3/uL (ref 4.0–10.5)
nRBC: 0 % (ref 0.0–0.2)

## 2019-08-12 MED ORDER — FUROSEMIDE 10 MG/ML IJ SOLN
40.0000 mg | Freq: Every day | INTRAMUSCULAR | Status: DC
  Administered 2019-08-12 – 2019-08-15 (×4): 40 mg via INTRAVENOUS
  Filled 2019-08-12 (×4): qty 4

## 2019-08-12 MED ORDER — APIXABAN 2.5 MG PO TABS
2.5000 mg | ORAL_TABLET | Freq: Two times a day (BID) | ORAL | Status: DC
  Administered 2019-08-12 (×2): 2.5 mg via ORAL
  Filled 2019-08-12 (×2): qty 1

## 2019-08-12 NOTE — Progress Notes (Signed)
Patient ID: Cesar [name], male   DOB: 08/07/1929, 85 y.o.   MRN: [id number]  Progress Note    08/12/2019 9:05 AM 6 Days Post-Op  Subjective: Comfortable.  Sitting up in chair.  Ate all of his breakfast.  Reports bowel movement   Vitals:   08/12/19 0639 08/12/19 0746  BP:    Pulse:    Resp:  19  Temp: 97.9 F (36.6 C)   SpO2:     Physical Exam: Abdomen soft and nontender  CBC    Component Value Date/Time   WBC 8.8 08/12/2019 0238   RBC 2.44 (L) 08/12/2019 0238   HGB 7.9 (L) 08/12/2019 0238   HCT 23.4 (L) 08/12/2019 0238   PLT 176 08/12/2019 0238   MCV 95.9 08/12/2019 0238   MCH 32.4 08/12/2019 0238   MCHC 33.8 08/12/2019 0238   RDW 14.3 08/12/2019 0238   LYMPHSABS 1.0 08/08/2019 0416   MONOABS 1.3 (H) 08/08/2019 0416   EOSABS 0.0 08/08/2019 0416   BASOSABS 0.0 08/08/2019 0416    BMET    Component Value Date/Time   NA 138 08/12/2019 0238   NA 139 07/10/2016   K 3.6 08/12/2019 0238   CL 106 08/12/2019 0238   CO2 25 08/12/2019 0238   GLUCOSE 112 (H) 08/12/2019 0238   BUN 25 (H) 08/12/2019 0238   BUN 20 07/10/2016   CREATININE 1.61 (H) 08/12/2019 0238   CALCIUM 7.7 (L) 08/12/2019 0238   GFRNONAA 38 (L) 08/12/2019 0238   GFRAA 44 (L) 08/12/2019 0238    INR    Component Value Date/Time   INR 6.1 (HH) 08/10/2019 1406     Intake/Output Summary (Last 24 hours) at 08/12/2019 0905 Last data filed at 08/11/2019 2036 Gross per 24 hour  Intake -  Output 2400 ml  Net -2400 ml     Assessment/Plan:  85 y.o. male stable for discharge.  Unfortunately insurance company has denied coverage for Eliquis despite failure of Coumadin.  Appeal in process.  Explained to the patient that this will not be resolved over the weekend.     [name], MD FACS [facility] [phone] 08/12/2019 9:05 AM

## 2019-08-12 NOTE — Progress Notes (Signed)
Progress Note  Patient Name: Cesar [name] Date of Encounter: 08/12/2019  Primary Cardiologist: [name], MD  Subjective   No specific complaints.  Has had mild intermittent wheezing, got a breathing treatment.  No chest pain or palpitations.  No leg pain.  Inpatient Medications    Scheduled Meds: . sodium chloride   Intravenous Once  . apixaban  2.5 mg Oral BID  . atorvastatin  80 mg Oral q1800  . Chlorhexidine Gluconate Cloth  6 each Topical Daily  . docusate  100 mg Per Tube Daily  . feeding supplement (ENSURE ENLIVE)  237 mL Oral BID BM  . metoprolol tartrate  25 mg Oral BID  . multivitamin with minerals  1 tablet Oral Daily  . pantoprazole sodium  40 mg Per Tube Daily   Continuous Infusions: . sodium chloride    . magnesium sulfate bolus IVPB     PRN Meds: sodium chloride, acetaminophen **OR** acetaminophen, alum & mag hydroxide-simeth, guaiFENesin-dextromethorphan, hydrALAZINE, ipratropium-albuterol, labetalol, magnesium sulfate bolus IVPB, metoprolol tartrate, morphine injection, ondansetron, oxyCODONE-acetaminophen, phenol, potassium chloride   Vital Signs    Vitals:   08/12/19 0639 08/12/19 0746 08/12/19 1021 08/12/19 1022  BP:   111/70 111/70  Pulse:   99 91  Resp:  19 (!) 24   Temp: 97.9 F (36.6 C)  98.1 F (36.7 C)   TempSrc: Oral  Oral   SpO2:   99%   Weight: 101.3 kg     Height: 6\' 1"  (1.854 m)       Intake/Output Summary (Last 24 hours) at 08/12/2019 1102 Last data filed at 08/11/2019 2036 Gross per 24 hour  Intake -  Output 2400 ml  Net -2400 ml   Filed Weights   08/11/19 0505 08/11/19 0559 08/12/19 0639  Weight: 104.6 kg 102.2 kg 101.3 kg    Telemetry    Atrial fibrillation. Personally reviewed.  ECG    An ECG dated 08/05/2019 was personally reviewed today and demonstrated:  Atrial fibrillation with IVCD and repolarization abnormalities.  Physical Exam   GEN:  Elderly male.  No acute distress.   Neck: No JVD. Cardiac:   Irregularly irregular, no gallop.  Respiratory: Nonlabored.  Few basilar crackles. GI: Soft, nontender, bowel sounds present. MS:  2+ lower leg edema; No deformity. Neuro:  Nonfocal. Psych: Normal affect.  Labs    Chemistry Recent Labs  Lab 08/06/19 0420  08/08/19 0416 08/09/19 0500 08/12/19 0238  NA 139   < > 144 139 138  K 4.1   < > 3.7 3.5 3.6  CL 108   < > 113* 112* 106  CO2 19*   < > 21* 21* 25  GLUCOSE 205*   < > 115* 107* 112*  BUN 21   < > 26* 24* 25*  CREATININE 1.36*   < > 1.57* 1.50* 1.61*  CALCIUM 7.9*   < > 7.5* 7.3* 7.7*  PROT 5.7*  --  5.5*  --  4.8*  ALBUMIN 2.8*  --  2.3*  --  2.0*  AST 29  --  27  --  40  ALT 24  --  17  --  26  ALKPHOS 46  --  45  --  52  BILITOT 0.8  --  0.9  --  1.6*  GFRNONAA 46*   < > 39* 41* 38*  GFRAA 53*   < > 45* 47* 44*  ANIONGAP 12   < > 10 6 7    < > = values  in this interval not displayed.     Hematology Recent Labs  Lab 08/10/19 0500 08/10/19 1406 08/11/19 0343 08/12/19 0238  WBC 6.8  --  8.2 8.8  RBC 2.37*  --  2.39* 2.44*  HGB 7.6* 8.7* 7.7* 7.9*  HCT 23.4* 26.4* 22.9* 23.4*  MCV 98.7  --  95.8 95.9  MCH 32.1  --  32.2 32.4  MCHC 32.5  --  33.6 33.8  RDW 14.0  --  13.8 14.3  PLT 149*  --  158 176    Radiology    No results found.  Cardiac Studies   Echocardiogram 07/31/2019:  1. Left ventricular ejection fraction, by visual estimation, is 60 to 65%. The left ventricle has normal function. Normal left ventricular size. There is moderately increased left ventricular hypertrophy.  2. Elevated mean left atrial pressure.  3. Global right ventricle has normal systolic function.The right ventricular size is mildly enlarged.  4. Left atrial size was severely dilated.  5. Right atrial size was severely dilated.  6. Moderate mitral annular calcification.  7. The mitral valve is normal in structure. Mild mitral valve regurgitation. No evidence of mitral stenosis.  8. The tricuspid valve is normal in structure.  Tricuspid valve regurgitation is mild.  9. The aortic valve is tricuspid Aortic valve regurgitation is trivial by color flow Doppler. Moderate aortic valve stenosis. 10. The pulmonic valve was normal in structure. Pulmonic valve regurgitation is mild by color flow Doppler. 11. Aortic dilatation noted. 12. There is mild dilatation of the aortic root measuring 39 mm. 13. Normal LV systolic function; moderate LVH; mildly dilated aortic root; severe biatrial enlargement; mild RVE; moderate AS (mean gradient 33 mmHg) and trace AI; mild MR.  Patient Profile     85 y.o. 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   Assessment & Plan    1.  Permanent atrial fibrillation.  Heart rate control is adequate.  He has been transitioned to Eliquis from Coumadin for stroke prophylaxis.  2.  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  3.  Postoperative anemia, hemoglobin 7.9.  4.  Moderate aortic valve stenosis.  Also has mild to moderate dilatation of the ascending aorta that is being followed conservatively at this point.  5. CKD stage 3, creatinine currently 1.61.  Continue Eliquis (it really would not make any sense for the insurance company to decline this based on failure on Coumadin with clearly documented embolic event and therapeutic INR).  Resume IV Lasix at 40 mg daily, continue Lopressor.  Follow-up BMET in a.m.  Signed, Rozann Lesches, MD  08/12/2019, 11:02 AM

## 2019-08-12 NOTE — Progress Notes (Signed)
ANTICOAGULATION CONSULT NOTE - Follow Up Consult  Pharmacy Consult for apixaban Indication: atrial fibrillation and SMA thrombosis  Labs: Recent Labs    08/10/19 0500 08/10/19 1406 08/11/19 0343 08/12/19 0238  HGB 7.6* 8.7* 7.7* 7.9*  HCT 23.4* 26.4* 22.9* 23.4*  PLT 149*  --  158 176  LABPROT 55.4* 53.4*  --   --   INR 6.4* 6.1*  --   --   CREATININE  --   --   --  1.61*    Assessment/Plan:  85 yo male with afib on warfarin PTA who developed SMA thrombus while on warfarin therapy. Patient is s/p embolectomy for SMA thrombus. Pharmacy consulted to dose apixaban for Afib in the setting of warfarin failure.  Patient was started on apixaban 5mg  BID after pharmacist discussion with Cardiology that there no need to load and to use 5mg  PO BID dose with SCr borderline at 1.5. Today, Scr is 1.61 and patient meets criteria to dose adjust apixaban to 2.5mg  BID for Afib. Discussed with Dr. Donnetta Hutching and he desired to dose adjust to 2.5mg  BID.    Plan:  Adjust apixaban to 2.5mg  PO BID Monitor renal function closely Monitor CBC, s/sx bleeding  Cristela Felt, PharmD PGY1 Pharmacy Resident [employer]: [phone]  08/12/2019 9:55 AM

## 2019-08-12 NOTE — Progress Notes (Signed)
ANTICOAGULATION CONSULT NOTE - Follow Up Consult  Pharmacy Consult for apixaban Indication: atrial fibrillation and SMA thrombosis  Labs: Recent Labs    08/09/19 0949  08/10/19 0500 08/10/19 1406 08/11/19 0343 08/12/19 0238  HGB  --    < > 7.6* 8.7* 7.7* 7.9*  HCT  --    < > 23.4* 26.4* 22.9* 23.4*  PLT  --   --  149*  --  158 176  LABPROT 23.0*  --  55.4* 53.4*  --   --   INR 2.1*  --  6.4* 6.1*  --   --   CREATININE  --   --   --   --   --  1.61*   < > = values in this interval not displayed.    Assessment/Plan:  85 yo male with afib on warfarin PTA who developed SMA thrombus while on warfarin therapy. Patient is s/p embolectomy for SMA thrombus. Pharmacy consulted to dose apixaban for Afib in the setting of warfarin failure.  Patient was started on apixaban 5mg  BID after pharmacist discussion with Cardiology that there no need to load and to use 5mg  PO BID dose with SCr borderline at 1.5 (patient has been as low as 1.36). Today, Scr is 1.61 and patient meets criteria to dose adjust apixaban to 2.5mg  BID for Afib. Discussed with Dr. Donnetta Hutching and he desired to dose adjust to 2.5mg  BID.    Plan:  Adjust apixaban to 2.5mg  PO BID Monitor renal function closely Monitor CBC, s/sx bleeding  Cristela Felt, PharmD PGY1 Pharmacy Resident [employer]: [phone]  08/12/2019 9:44 AM

## 2019-08-13 LAB — BASIC METABOLIC PANEL
Anion gap: 8 (ref 5–15)
BUN: 27 mg/dL — ABNORMAL HIGH (ref 8–23)
CO2: 27 mmol/L (ref 22–32)
Calcium: 8 mg/dL — ABNORMAL LOW (ref 8.9–10.3)
Chloride: 105 mmol/L (ref 98–111)
Creatinine, Ser: 1.45 mg/dL — ABNORMAL HIGH (ref 0.61–1.24)
GFR calc Af Amer: 49 mL/min — ABNORMAL LOW (ref >60)
GFR calc non Af Amer: 43 mL/min — ABNORMAL LOW (ref >60)
Glucose, Bld: 117 mg/dL — ABNORMAL HIGH (ref 70–99)
Potassium: 4.2 mmol/L (ref 3.5–5.1)
Sodium: 140 mmol/L (ref 135–145)

## 2019-08-13 LAB — CBC
HCT: 24.8 % — ABNORMAL LOW (ref 39.0–52.0)
Hemoglobin: 8.4 g/dL — ABNORMAL LOW (ref 13.0–17.0)
MCH: 32.6 pg (ref 26.0–34.0)
MCHC: 33.9 g/dL (ref 30.0–36.0)
MCV: 96.1 fL (ref 80.0–100.0)
Platelets: 207 10*3/uL (ref 150–400)
RBC: 2.58 MIL/uL — ABNORMAL LOW (ref 4.22–5.81)
RDW: 14.6 % (ref 11.5–15.5)
WBC: 9.1 10*3/uL (ref 4.0–10.5)
nRBC: 0.2 % (ref 0.0–0.2)

## 2019-08-13 MED ORDER — PANTOPRAZOLE SODIUM 40 MG PO TBEC
40.0000 mg | DELAYED_RELEASE_TABLET | Freq: Every day | ORAL | Status: DC
  Administered 2019-08-14 – 2019-08-15 (×2): 40 mg via ORAL
  Filled 2019-08-13 (×2): qty 1

## 2019-08-13 MED ORDER — DOCUSATE SODIUM 100 MG PO CAPS
100.0000 mg | ORAL_CAPSULE | Freq: Every day | ORAL | Status: DC
  Administered 2019-08-13 – 2019-08-15 (×3): 100 mg via ORAL
  Filled 2019-08-13 (×3): qty 1

## 2019-08-13 MED ORDER — APIXABAN 5 MG PO TABS
5.0000 mg | ORAL_TABLET | Freq: Two times a day (BID) | ORAL | Status: DC
  Administered 2019-08-13 – 2019-08-15 (×5): 5 mg via ORAL
  Filled 2019-08-13 (×5): qty 1

## 2019-08-13 MED ORDER — METOPROLOL TARTRATE 25 MG PO TABS: 25.0000 mg | ORAL_TABLET | Freq: Three times a day (TID) | ORAL | Status: DC

## 2019-08-13 MED ORDER — METOPROLOL TARTRATE 25 MG PO TABS
25.0000 mg | ORAL_TABLET | Freq: Three times a day (TID) | ORAL | Status: DC
  Administered 2019-08-13 – 2019-08-15 (×6): 25 mg via ORAL
  Filled 2019-08-13 (×6): qty 1

## 2019-08-13 NOTE — Progress Notes (Signed)
Patient ID: GERTRUDE PLYMIRE, male   DOB: 06-28-1929, 85 y.o.   MRN: [id number]  Progress Note    08/13/2019 8:15 AM 7 Days Post-Op  Subjective: Sitting up in chair.  No complaints.  Completed eating all breakfast.  Denies abdominal pain   Vitals:   08/13/19 0400 08/13/19 0641  BP: 126/79 117/76  Pulse: 89 95  Resp: (!) 22 (!) 22  Temp:  98 F (36.7 C)  SpO2: 97% 98%   Physical Exam: Abdomen soft and nontender  CBC    Component Value Date/Time   WBC 9.1 08/13/2019 0235   RBC 2.58 (L) 08/13/2019 0235   HGB 8.4 (L) 08/13/2019 0235   HCT 24.8 (L) 08/13/2019 0235   PLT 207 08/13/2019 0235   MCV 96.1 08/13/2019 0235   MCH 32.6 08/13/2019 0235   MCHC 33.9 08/13/2019 0235   RDW 14.6 08/13/2019 0235   LYMPHSABS 1.0 08/08/2019 0416   MONOABS 1.3 (H) 08/08/2019 0416   EOSABS 0.0 08/08/2019 0416   BASOSABS 0.0 08/08/2019 0416    BMET    Component Value Date/Time   NA 140 08/13/2019 0235   NA 139 07/10/2016   K 4.2 08/13/2019 0235   CL 105 08/13/2019 0235   CO2 27 08/13/2019 0235   GLUCOSE 117 (H) 08/13/2019 0235   BUN 27 (H) 08/13/2019 0235   BUN 20 07/10/2016   CREATININE 1.45 (H) 08/13/2019 0235   CALCIUM 8.0 (L) 08/13/2019 0235   GFRNONAA 43 (L) 08/13/2019 0235   GFRAA 49 (L) 08/13/2019 0235    INR    Component Value Date/Time   INR 6.1 (HH) 08/10/2019 1406     Intake/Output Summary (Last 24 hours) at 08/13/2019 0815 Last data filed at 08/13/2019 0600 Gross per 24 hour  Intake 120 ml  Output 3725 ml  Net -3605 ml     Assessment/Plan:  85 y.o. male stable status post exploratory laparotomy for SMA embolectomy.  Awaiting appeal for insurance coverage for transition from Coumadin to [place] since he had failure on therapeutic Coumadin with embolus     [name], MD FACS [facility] [phone] 08/13/2019 8:15 AM

## 2019-08-13 NOTE — Progress Notes (Signed)
Progress Note  Patient Name: Cesar [name] Date of Encounter: 08/13/2019  Primary Cardiologist: [name], MD  Subjective   Sitting in bedside chair today.  Reports no chest pain or shortness of breath, no palpitations.  Inpatient Medications    Scheduled Meds:  sodium chloride   Intravenous Once   apixaban  5 mg Oral BID   atorvastatin  80 mg Oral q1800   Chlorhexidine Gluconate Cloth  6 each Topical Daily   docusate  100 mg Per Tube Daily   feeding supplement (ENSURE ENLIVE)  237 mL Oral BID BM   furosemide  40 mg Intravenous Daily   metoprolol tartrate  25 mg Oral BID   multivitamin with minerals  1 tablet Oral Daily   pantoprazole sodium  40 mg Per Tube Daily   Continuous Infusions:  sodium chloride     magnesium sulfate bolus IVPB     PRN Meds: sodium chloride, acetaminophen **OR** acetaminophen, alum & mag hydroxide-simeth, guaiFENesin-dextromethorphan, hydrALAZINE, ipratropium-albuterol, labetalol, magnesium sulfate bolus IVPB, metoprolol tartrate, morphine injection, ondansetron, oxyCODONE-acetaminophen, phenol, potassium chloride   Vital Signs    Vitals:   08/13/19 0000 08/13/19 0400 08/13/19 0641 08/13/19 0834  BP: 113/68 126/79 117/76 120/68  Pulse: 86 89 95 85  Resp: (!) 24 (!) 22 (!) 22 (!) 23  Temp:   98 F (36.7 C) 98.1 F (36.7 C)  TempSrc:   Oral Oral  SpO2: 97% 97% 98% 95%  Weight:   95 kg   Height:        Intake/Output Summary (Last 24 hours) at 08/13/2019 1003 Last data filed at 08/13/2019 0600 Gross per 24 hour  Intake 120 ml  Output 3725 ml  Net -3605 ml   Filed Weights   08/11/19 0559 08/12/19 0639 08/13/19 0641  Weight: 102.2 kg 101.3 kg 95 kg    Telemetry    Atrial fibrillation.  Personally reviewed.  Physical Exam   GEN:  Elderly male.  No acute distress.   Neck: No JVD. Cardiac:  Irregularly irregular, no gallop.  Respiratory: Nonlabored.  Few basilar crackles. GI: Soft, nontender, bowel sounds  present. MS:  1-2+ lower leg edema; No deformity. Neuro:  Nonfocal. Psych: Alert and oriented x 3. Normal affect.  Labs    Chemistry Recent Labs  Lab 08/08/19 0416 08/09/19 0500 08/12/19 0238 08/13/19 0235  NA 144 139 138 140  K 3.7 3.5 3.6 4.2  CL 113* 112* 106 105  CO2 21* 21* 25 27  GLUCOSE 115* 107* 112* 117*  BUN 26* 24* 25* 27*  CREATININE 1.57* 1.50* 1.61* 1.45*  CALCIUM 7.5* 7.3* 7.7* 8.0*  PROT 5.5*  --  4.8*  --   ALBUMIN 2.3*  --  2.0*  --   AST 27  --  40  --   ALT 17  --  26  --   ALKPHOS 45  --  52  --   BILITOT 0.9  --  1.6*  --   GFRNONAA 39* 41* 38* 43*  GFRAA 45* 47* 44* 49*  ANIONGAP 10 6 7 8      Hematology Recent Labs  Lab 08/11/19 0343 08/12/19 0238 08/13/19 0235  WBC 8.2 8.8 9.1  RBC 2.39* 2.44* 2.58*  HGB 7.7* 7.9* 8.4*  HCT 22.9* 23.4* 24.8*  MCV 95.8 95.9 96.1  MCH 32.2 32.4 32.6  MCHC 33.6 33.8 33.9  RDW 13.8 14.3 14.6  PLT 158 176 207    Radiology    No results found.  Cardiac  Studies   Echocardiogram 07/31/2019: 1. Left ventricular ejection fraction, by visual estimation, is 60 to 65%. The left ventricle has normal function. Normal left ventricular size. There is moderately increased left ventricular hypertrophy. 2. Elevated mean left atrial pressure. 3. Global right ventricle has normal systolic function.The right ventricular size is mildly enlarged. 4. Left atrial size was severely dilated. 5. Right atrial size was severely dilated. 6. Moderate mitral annular calcification. 7. The mitral valve is normal in structure. Mild mitral valve regurgitation. No evidence of mitral stenosis. 8. The tricuspid valve is normal in structure. Tricuspid valve regurgitation is mild. 9. The aortic valve is tricuspid Aortic valve regurgitation is trivial by color flow Doppler. Moderate aortic valve stenosis. 10. The pulmonic valve was normal in structure. Pulmonic valve regurgitation is mild by color flow Doppler. 11. Aortic  dilatation noted. 12. There is mild dilatation of the aortic root measuring 39 mm. 13. Normal LV systolic function; moderate LVH; mildly dilated aortic root; severe biatrial enlargement; mild RVE; moderate AS (mean gradient 33 mmHg) and trace AI; mild MR.  Patient Profile     85 y.o. 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  Assessment & Plan    1.  Permanent atrial fibrillation.  He is now on Eliquis and transition from Coumadin.  Lopressor being uptitrated for better heart rate control.  2.  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  This occurred while he was on Coumadin with therapeutic INR resulting in transition to Eliquis although this has not yet been cleared by his insurance provider.  3.  Moderate aortic valve stenosis with mild to moderate dilatation of the ascending aorta.  Following conservatively.  4.  CKD stage IIIb, creatinine 1.45.  5.  Postoperative anemia, stable.  Increasing Lopressor to 25 mg 3 times a day, can consider consolidation to Toprol-XL at discharge.  Eliquis is dosed at 5 mg twice daily today, had been at the 2.5 mg twice daily dose based on previous creatinine.  Per chart review his average creatinine has been 1.5 or or somewhat higher, and at age 85 it may be worth considering going with Eliquis at 2.5 mg twice daily as an outpatient.  He is right on the borderline of the dose transition, and obviously this is not something that can be adjusted on a day-to-day basis as an outpatient.  Arguing against this is the fact that he did have an embolic event while therapeutic on Coumadin.  Signed, Rozann Lesches, MD  08/13/2019, 10:03 AM

## 2019-08-13 NOTE — Progress Notes (Signed)
ANTICOAGULATION CONSULT NOTE - Follow Up Consult  Pharmacy Consult for apixaban Indication: atrial fibrillation and SMA thrombosis  Labs: Recent Labs    08/10/19 1406 08/11/19 0343 08/12/19 0238 08/13/19 0235  HGB 8.7* 7.7* 7.9* 8.4*  HCT 26.4* 22.9* 23.4* 24.8*  PLT  --  158 176 207  LABPROT 53.4*  --   --   --   INR 6.1*  --   --   --   CREATININE  --   --  1.61* 1.45*    Assessment/Plan:  85 yo male with afib on warfarin PTA who developed SMA thrombus while on warfarin therapy. Patient is s/p embolectomy for SMA thrombus. Pharmacy consulted to dose apixaban for Afib in the setting of warfarin failure.  Patient was started on apixaban 5mg  BID after pharmacist discussion with Cardiology that there no need to load and to use 5mg  PO BID dose with SCr borderline at 1.5. Yesterday, Scr increased to 1.61 and after discussion with Dr. Donnetta Hutching patient was changed to apixaban 2.5mg  twice daily. Today, Scr is 1.45 and patient no longer meets the criteria for the 2.5mg  dosing.    Plan:  Adjust apixaban back to 5mg  PO BID Monitor renal function closely Monitor CBC and S/S of bleeding Follow up with cardiology on for long term plan for apixaban given borderline Scr  Cristela Felt, PharmD PGY1 Pharmacy Resident [employer]: [phone]  08/13/2019 7:04 AM

## 2019-08-14 LAB — BASIC METABOLIC PANEL
Anion gap: 9 (ref 5–15)
BUN: 28 mg/dL — ABNORMAL HIGH (ref 8–23)
CO2: 27 mmol/L (ref 22–32)
Calcium: 8.2 mg/dL — ABNORMAL LOW (ref 8.9–10.3)
Chloride: 102 mmol/L (ref 98–111)
Creatinine, Ser: 1.45 mg/dL — ABNORMAL HIGH (ref 0.61–1.24)
GFR calc Af Amer: 49 mL/min — ABNORMAL LOW (ref >60)
GFR calc non Af Amer: 43 mL/min — ABNORMAL LOW (ref >60)
Glucose, Bld: 143 mg/dL — ABNORMAL HIGH (ref 70–99)
Potassium: 3.9 mmol/L (ref 3.5–5.1)
Sodium: 138 mmol/L (ref 135–145)

## 2019-08-14 LAB — CBC
HCT: 26.7 % — ABNORMAL LOW (ref 39.0–52.0)
Hemoglobin: 8.8 g/dL — ABNORMAL LOW (ref 13.0–17.0)
MCH: 32 pg (ref 26.0–34.0)
MCHC: 33 g/dL (ref 30.0–36.0)
MCV: 97.1 fL (ref 80.0–100.0)
Platelets: 229 10*3/uL (ref 150–400)
RBC: 2.75 MIL/uL — ABNORMAL LOW (ref 4.22–5.81)
RDW: 14.7 % (ref 11.5–15.5)
WBC: 7.8 10*3/uL (ref 4.0–10.5)
nRBC: 0 % (ref 0.0–0.2)

## 2019-08-14 LAB — GLUCOSE, CAPILLARY: Glucose-Capillary: 105 mg/dL — ABNORMAL HIGH (ref 70–99)

## 2019-08-14 NOTE — Progress Notes (Signed)
Occupational Therapy Treatment Patient Details Name: Cesar [name] MRN: [id number] DOB: [birth year] Today's Date: 08/14/2019    History of present illness Pt admit for SUPERIOR MESENTERIC ARTERY THROMBECTOMY and Patch Angioplasty of superior messenteric artery with RIGHT femoral vein.  Post op afib.    OT comments  Pt eager to go home and frustrated per wife who participated in session. Pt feels comfortable she can manage pt at home after observing today's session. Pt needing some verbal cues for sequencing, but only min guard physical assistance for OOB and ADL.   Follow Up Recommendations  Home health OT;Supervision/Assistance - 24 hour    Equipment Recommendations  None recommended by OT    Recommendations for Other Services      Precautions / Restrictions Precautions Precautions: Fall       Mobility Bed Mobility Overal bed mobility: Needs Assistance Bed Mobility: Supine to Sit     Supine to sit: Supervision     General bed mobility comments: no physical assist, supervision for safety  Transfers Overall transfer level: Needs assistance Equipment used: Rolling walker (2 wheeled) Transfers: Sit to/from Stand Sit to Stand: Min guard         General transfer comment: cues for hand placement    Balance Overall balance assessment: Needs assistance   Sitting balance-Leahy Scale: Good Sitting balance - Comments: no LOB bending over to don socks     Standing balance-Leahy Scale: Fair Standing balance comment: can release walker at sink in static standing                           ADL either performed or assessed with clinical judgement   ADL Overall ADL's : Needs assistance/impaired Eating/Feeding: Independent;Sitting Eating/Feeding Details (indicate cue type and reason): opens containers, increased spillage Grooming: Wash/dry hands;Standing;Minimal assistance Grooming Details (indicate cue type and reason): cues to use soap and for sequencing          Upper Body Dressing : Sitting;Set up Upper Body Dressing Details (indicate cue type and reason): front opening gown Lower Body Dressing: Minimal assistance;Sitting/lateral leans Lower Body Dressing Details (indicate cue type and reason): assist due to toe nails catching on socks Toilet Transfer: Min guard;Ambulation;RW           Functional mobility during ADLs: Min guard;Rolling walker       Vision       Perception     Praxis      Cognition Arousal/Alertness: Awake/alert Behavior During Therapy: Flat affect Overall Cognitive Status: History of cognitive impairments - at baseline                                          Exercises     Shoulder Instructions       General Comments      Pertinent Vitals/ Pain       Pain Assessment: Faces Faces Pain Scale: No hurt  Home Living                                          Prior Functioning/Environment              Frequency  Min 2X/week        Progress Toward Goals  OT Goals(current goals can now  be found in the care plan section)  Progress towards OT goals: Progressing toward goals  Acute Rehab OT Goals Patient Stated Goal: to get home  OT Goal Formulation: With patient Time For Goal Achievement: 08/21/19 Potential to Achieve Goals: Good  Plan Discharge plan remains appropriate    Co-evaluation                 AM-PAC OT "6 Clicks" Daily Activity     Outcome Measure   Help from another person eating meals?: None Help from another person taking care of personal grooming?: A Little Help from another person toileting, which includes using toliet, bedpan, or urinal?: A Little Help from another person bathing (including washing, rinsing, drying)?: A Little Help from another person to put on and taking off regular upper body clothing?: None Help from another person to put on and taking off regular lower body clothing?: A Little 6 Click Score: 20     End of Session Equipment Utilized During Treatment: Rolling walker;Gait belt  OT Visit Diagnosis: Other abnormalities of gait and mobility (R26.89);Muscle weakness (generalized) (M62.81);Pain   Activity Tolerance Patient tolerated treatment well   Patient Left in chair;with call bell/phone within reach;with family/visitor present   Nurse Communication          Time: [id number] OT Time Calculation (min): 17 min  Charges: OT General Charges $OT Visit: 1 Visit OT Treatments $Self Care/Home Management : 8-22 mins  Nestor Lewandowsky, OTR/L Acute Rehabilitation Services Pager: [phone] Office: [phone]   Malka So 08/14/2019, 12:44 PM

## 2019-08-14 NOTE — Care Management Important Message (Signed)
Important Message  Patient Details  Name: Cesar [name] MRN: [id number] Date of Birth: 1929/03/28   Medicare Important Message Given:  Yes     [name] 08/14/2019, 12:47 PM

## 2019-08-14 NOTE — Progress Notes (Signed)
Follow up done on the pre-auth that was called into insurance last week by Cardiology- insurance has approved Eliquis- however pt has not met his deductible yet for this year- so pt would have to pay total cost of the drug until deductible is met- then copay would be $47. Eliquis is a tier 3 drug. Also had them check on Xarelto which is also a tier 3 drug and would fall under the same copay cost after meeting the deductible. Discussed if a tier exception was an option but was told that these drugs would not fall under that option. Per Cesar [name] at [facility] who provided this info- pt has about $435 left to pay towards deductible for this year - before deductible would then reset in January.   Discussed the above with pt's wife and informed her of approval- she was aware that they had not met the drug deductible for the year. And understands they would need to pay out of pocket cost for Eliquis before copay of $47 would be applied. Also reminded her that deductible resets in January.  She requested CM also call daughter to let her know the information so that they could discuss later. TC made to daughter Cesar [name]- to let her know the above coverage of Eliquis.

## 2019-08-14 NOTE — Progress Notes (Signed)
Physical Therapy Treatment Patient Details Name: Cesar [name] MRN: [id number] DOB: 1929/07/06 Today's Date: 08/14/2019    History of Present Illness Pt admit for SUPERIOR MESENTERIC ARTERY THROMBECTOMY and Patch Angioplasty of superior messenteric artery with RIGHT femoral vein.  Post op afib.     PT Comments    Pt was seen for bed ex's as he declined OOB due to pending discharge home.  Could not get agreement even with wife present who is asking pt to walk.  Follow acutely for gait next visit to increase safety and strength for transition home with HHPT and his wife to encourage his complete cooperation with further rehab.   Follow Up Recommendations  Home health PT;Supervision for mobility/OOB;Supervision/Assistance - 24 hour     Equipment Recommendations  None recommended by PT    Recommendations for Other Services       Precautions / Restrictions Precautions Precautions: Fall Precaution Comments: watch HR- Afib Restrictions Weight Bearing Restrictions: No    Mobility  Bed Mobility Overal bed mobility: Needs Assistance Bed Mobility: Supine to Sit Rolling: Min guard         General bed mobility comments: max assist with bed tilted to scoot up in the bed  Transfers                 General transfer comment: declined  Ambulation/Gait                 Stairs             Wheelchair Mobility    Modified Rankin (Stroke Patients Only)       Balance Overall balance assessment: Needs assistance Sitting-balance support: Feet supported Sitting balance-Leahy Scale: Fair                                      Cognition Arousal/Alertness: Awake/alert Behavior During Therapy: Flat affect Overall Cognitive Status: History of cognitive impairments - at baseline Area of Impairment: Problem solving;Awareness;Safety/judgement;Following commands                   Current Attention Level: Selective Memory: Decreased  short-term memory Following Commands: Follows one step commands inconsistently;Follows one step commands with increased time Safety/Judgement: Decreased awareness of safety;Decreased awareness of deficits Awareness: Intellectual Problem Solving: Slow processing;Requires verbal cues General Comments: ablle to get pt to follow instructions for ex's but did not agree to gait due to reporting he would only walk when leaving for the car      Exercises General Exercises - Lower Extremity Ankle Circles/Pumps: AROM;5 reps Quad Sets: AROM;10 reps Gluteal Sets: AROM;10 reps Long Arc Quad: Strengthening;10 reps Heel Slides: Strengthening;10 reps Hip ABduction/ADduction: AROM;10 reps Straight Leg Raises: AAROM;10 reps    General Comments General comments (skin integrity, edema, etc.): pt is having some difficulty with following directional cues from [facility] and low level of energy      Pertinent Vitals/Pain Pain Assessment: Faces Faces Pain Scale: No hurt    Home Living                      Prior Function            PT Goals (current goals can now be found in the care plan section) Progress towards PT goals: Progressing toward goals    Frequency    Min 3X/week      PT Plan Current plan remains appropriate  Co-evaluation              AM-PAC PT "6 Clicks" Mobility   Outcome Measure  Help needed turning from your back to your side while in a flat bed without using bedrails?: A Little Help needed moving from lying on your back to sitting on the side of a flat bed without using bedrails?: A Lot Help needed moving to and from a bed to a chair (including a wheelchair)?: A Lot Help needed standing up from a chair using your arms (e.g., wheelchair or bedside chair)?: A Lot Help needed to walk in hospital room?: A Lot Help needed climbing 3-5 steps with a railing? : A Lot 6 Click Score: 13    End of Session   Activity Tolerance: Patient tolerated treatment  well Patient left: with call bell/phone within reach;in bed;with bed alarm set;with family/visitor present Nurse Communication: Mobility status PT Visit Diagnosis: Muscle weakness (generalized) (M62.81);Unsteadiness on feet (R26.81)     Time: [id number] PT Time Calculation (min) (ACUTE ONLY): 16 min  Charges:  $Therapeutic Exercise: 8-22 mins                  Ramond Dial 08/14/2019, 7:57 PM   Mee Hives, PT MS Acute Rehab Dept. Number: [place]

## 2019-08-14 NOTE — Plan of Care (Signed)
Continue to monitor

## 2019-08-14 NOTE — Progress Notes (Addendum)
  Progress Note    08/14/2019 7:27 AM 8 Days Post-Op  Subjective: Wants to go home. Tolerating a regular diet   Vitals:   08/14/19 0400 08/14/19 0655  BP: 120/60   Pulse:    Resp: 20 (!) 22  Temp:    SpO2:     Physical Exam: Lungs:  Non labored Incisions:  Midline incision c/d/i Extremities:  Feet symmetrically warm to touch Abdomen:  Soft, NT, ND Neurologic: A&O  CBC    Component Value Date/Time   WBC 7.8 08/14/2019 0329   RBC 2.75 (L) 08/14/2019 0329   HGB 8.8 (L) 08/14/2019 0329   HCT 26.7 (L) 08/14/2019 0329   PLT 229 08/14/2019 0329   MCV 97.1 08/14/2019 0329   MCH 32.0 08/14/2019 0329   MCHC 33.0 08/14/2019 0329   RDW 14.7 08/14/2019 0329   LYMPHSABS 1.0 08/08/2019 0416   MONOABS 1.3 (H) 08/08/2019 0416   EOSABS 0.0 08/08/2019 0416   BASOSABS 0.0 08/08/2019 0416    BMET    Component Value Date/Time   NA 140 08/13/2019 0235   NA 139 07/10/2016   K 4.2 08/13/2019 0235   CL 105 08/13/2019 0235   CO2 27 08/13/2019 0235   GLUCOSE 117 (H) 08/13/2019 0235   BUN 27 (H) 08/13/2019 0235   BUN 20 07/10/2016   CREATININE 1.45 (H) 08/13/2019 0235   CALCIUM 8.0 (L) 08/13/2019 0235   GFRNONAA 43 (L) 08/13/2019 0235   GFRAA 49 (L) 08/13/2019 0235    INR    Component Value Date/Time   INR 6.1 (HH) 08/10/2019 1406     Intake/Output Summary (Last 24 hours) at 08/14/2019 [id number] Last data filed at 08/13/2019 1801 Gross per 24 hour  Intake -  Output 2750 ml  Net -2750 ml     Assessment/Plan:  85 y.o. male is s/p SMA embolectomy and vein patch angioplasty 8 Days Post-Op    Abd incision healing well Tolerating a regular diet without N/V Ok for discharge home when anticoagulation question is settled   Dagoberto Ligas, PA-C [facility] [phone] 08/14/2019 7:27 AM  I have examined the patient, reviewed and agree with above.  Comfortable.  Abdominal incision healing nicely.  Discussed with the patient and his wife present.  Still  await approval for Eliquis.  Social work/case management involved  Curt Jews, MD 08/14/2019 2:03 PM

## 2019-08-14 NOTE — Progress Notes (Signed)
Progress Note  Patient Name: Cesar [name] Date of Encounter: 08/14/2019  Primary Cardiologist: [name], MD   Subjective   Possible discharge today. Patient is ready to go home. No CP or SOB.   Inpatient Medications    Scheduled Meds: . sodium chloride   Intravenous Once  . apixaban  5 mg Oral BID  . atorvastatin  80 mg Oral q1800  . docusate sodium  100 mg Oral Daily  . feeding supplement (ENSURE ENLIVE)  237 mL Oral BID BM  . furosemide  40 mg Intravenous Daily  . metoprolol tartrate  25 mg Oral TID  . multivitamin with minerals  1 tablet Oral Daily  . pantoprazole  40 mg Oral Daily   Continuous Infusions: . sodium chloride    . magnesium sulfate bolus IVPB     PRN Meds: sodium chloride, acetaminophen **OR** acetaminophen, alum & mag hydroxide-simeth, guaiFENesin-dextromethorphan, hydrALAZINE, ipratropium-albuterol, labetalol, magnesium sulfate bolus IVPB, metoprolol tartrate, morphine injection, ondansetron, oxyCODONE-acetaminophen, phenol, potassium chloride   Vital Signs    Vitals:   08/14/19 0400 08/14/19 0655 08/14/19 0746 08/14/19 0800  BP: 120/60  124/77   Pulse:   (!) 110   Resp: 20 (!) 22 20 (!) 22  Temp:   97.8 F (36.6 C)   TempSrc: Oral  Oral   SpO2:   94%   Weight:  96.6 kg    Height:        Intake/Output Summary (Last 24 hours) at 08/14/2019 0855 Last data filed at 08/13/2019 1801 Gross per 24 hour  Intake -  Output 2750 ml  Net -2750 ml   Last 3 Weights 08/14/2019 08/13/2019 08/12/2019  Weight (lbs) 213 lb 209 lb 6.4 oz 223 lb 6.4 oz  Weight (kg) 96.616 kg 94.983 kg 101.334 kg      Telemetry    Afib HR 80s, 2 beats NSVT - Personally Reviewed  ECG    No new - Personally Reviewed  Physical Exam   GEN: No acute distress.   Neck: No JVD Cardiac: RRR, systolic murmurs, rubs, or gallops.  Respiratory: Diminished breath sounds bilaterally. GI: Soft, nontender, non-distended  MS: Trace edema; No deformity. Neuro:  Nonfocal   Psych: Normal affect   Labs    High Sensitivity Troponin:  No results for input(s): TROPONINIHS in the last 720 hours.    Chemistry Recent Labs  Lab 08/08/19 0416 08/09/19 0500 08/12/19 0238 08/13/19 0235  NA 144 139 138 140  K 3.7 3.5 3.6 4.2  CL 113* 112* 106 105  CO2 21* 21* 25 27  GLUCOSE 115* 107* 112* 117*  BUN 26* 24* 25* 27*  CREATININE 1.57* 1.50* 1.61* 1.45*  CALCIUM 7.5* 7.3* 7.7* 8.0*  PROT 5.5*  --  4.8*  --   ALBUMIN 2.3*  --  2.0*  --   AST 27  --  40  --   ALT 17  --  26  --   ALKPHOS 45  --  52  --   BILITOT 0.9  --  1.6*  --   GFRNONAA 39* 41* 38* 43*  GFRAA 45* 47* 44* 49*  ANIONGAP 10 6 7 8      Hematology Recent Labs  Lab 08/12/19 0238 08/13/19 0235 08/14/19 0329  WBC 8.8 9.1 7.8  RBC 2.44* 2.58* 2.75*  HGB 7.9* 8.4* 8.8*  HCT 23.4* 24.8* 26.7*  MCV 95.9 96.1 97.1  MCH 32.4 32.6 32.0  MCHC 33.8 33.9 33.0  RDW 14.3 14.6 14.7  PLT 176  207 229    BNPNo results for input(s): BNP, PROBNP in the last 168 hours.   DDimer No results for input(s): DDIMER in the last 168 hours.   Radiology    No results found.  Cardiac Studies   Echocardiogram 07/31/2019: 1. Left ventricular ejection fraction, by visual estimation, is 60 to 65%. The left ventricle has normal function. Normal left ventricular size. There is moderately increased left ventricular hypertrophy. 2. Elevated mean left atrial pressure. 3. Global right ventricle has normal systolic function.The right ventricular size is mildly enlarged. 4. Left atrial size was severely dilated. 5. Right atrial size was severely dilated. 6. Moderate mitral annular calcification. 7. The mitral valve is normal in structure. Mild mitral valve regurgitation. No evidence of mitral stenosis. 8. The tricuspid valve is normal in structure. Tricuspid valve regurgitation is mild. 9. The aortic valve is tricuspid Aortic valve regurgitation is trivial by color flow Doppler. Moderate aortic valve  stenosis. 10. The pulmonic valve was normal in structure. Pulmonic valve regurgitation is mild by color flow Doppler. 11. Aortic dilatation noted. 12. There is mild dilatation of the aortic root measuring 39 mm. 13. Normal LV systolic function; moderate LVH; mildly dilated aortic root; severe biatrial enlargement; mild RVE; moderate AS (mean gradient 33 mmHg) and trace AI; mild MR.  Patient Profile     85 y.o. 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  Assessment & Plan    Permanent afib - transitioned from coumadin to Eliquis. - Rates were elevated yesterday and Lopressor was increased to 25 mg TID (due today). Rates in the 80s.  Can consolidate at discharge  - EF this admission 60-65% - Eliquis was started at 5 mg daily, however creatinine has been on the border, 1.45 Today. Age 85. Patient has been on the 2.5 mg BID in the past. 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 AM labs pending   For questions or updates, please contact [place] Please consult [email or web address] for contact info under        Signed, Braedon Sjogren Ninfa Meeker, PA-C  08/14/2019, 8:55 AM

## 2019-08-15 LAB — BASIC METABOLIC PANEL
Anion gap: 7 (ref 5–15)
BUN: 27 mg/dL — ABNORMAL HIGH (ref 8–23)
CO2: 30 mmol/L (ref 22–32)
Calcium: 8.4 mg/dL — ABNORMAL LOW (ref 8.9–10.3)
Chloride: 102 mmol/L (ref 98–111)
Creatinine, Ser: 1.51 mg/dL — ABNORMAL HIGH (ref 0.61–1.24)
GFR calc Af Amer: 47 mL/min — ABNORMAL LOW (ref >60)
GFR calc non Af Amer: 41 mL/min — ABNORMAL LOW (ref >60)
Glucose, Bld: 111 mg/dL — ABNORMAL HIGH (ref 70–99)
Potassium: 4.8 mmol/L (ref 3.5–5.1)
Sodium: 139 mmol/L (ref 135–145)

## 2019-08-15 MED ORDER — METOPROLOL TARTRATE 25 MG PO TABS: 25.0000 mg | ORAL_TABLET | Freq: Three times a day (TID) | ORAL | 0 refills | Status: DC

## 2019-08-15 MED ORDER — APIXABAN 2.5 MG PO TABS: 2.5000 mg | ORAL_TABLET | Freq: Two times a day (BID) | ORAL | Status: DC

## 2019-08-15 MED ORDER — APIXABAN 2.5 MG PO TABS: 2.5000 mg | ORAL_TABLET | Freq: Two times a day (BID) | ORAL | 1 refills | Status: DC

## 2019-08-15 MED FILL — ELIQUIS 2.5 MG TABLET: 2.5 | 30 days supply | Qty: 60 | Fill #0

## 2019-08-15 MED FILL — METOPROLOL TARTRATE 25 MG T: 25 | 30 days supply | Qty: 90 | Fill #0

## 2019-08-15 NOTE — Progress Notes (Signed)
Care plan reviewed, Pt is progressing. Pt stated he's looking forward to go home. No acute distress noted tonight. Vital remained stable. Atrial fibrillation on the monitor. HR 70s-80s. Denied pain, slept well tonight. Continue to monitor.  Kennyth Lose, RN

## 2019-08-15 NOTE — Plan of Care (Signed)
Continue to monitor

## 2019-08-15 NOTE — Progress Notes (Signed)
ANTICOAGULATION CONSULT NOTE - Follow Up Consult  Pharmacy Consult for apixaban Indication: atrial fibrillation and SMA thrombosis  Labs: Recent Labs    08/13/19 0235 08/14/19 0329 08/14/19 0935 08/15/19 0307  HGB 8.4* 8.8*  --   --   HCT 24.8* 26.7*  --   --   PLT 207 229  --   --   CREATININE 1.45*  --  1.45* 1.51*    Assessment/Plan:  85 yo male with afib on warfarin PTA who developed SMA thrombus while on warfarin therapy. Patient is s/p embolectomy for SMA thrombus. Pharmacy consulted to dose apixaban for Afib in the setting of warfarin failure.  Patient was started on apixaban 5mg  BID after pharmacist discussion with Cardiology that there no need to load and to use 5mg  PO BID dose with SCr borderline at 1.5. Cr has been labile since beginning apixaban. Current SCr is 1.51 and prior to this admission appears Cr tends to be 1.5-1.6 so will reduce dosing back.   Plan:  Adjust apixaban to 2.5mg  PO BID Follow Cr   Arrie Senate, PharmD, BCPS Clinical Pharmacist [phone] Please check AMION for all [place] numbers 08/15/2019

## 2019-08-15 NOTE — Consult Note (Signed)
   [facility] CM Inpatient Consult   08/15/2019  Cesar [name] July 19, 1929 [id number]    Patient screened for potential [facility] Care Management servicesneeded underhis Medicare/ [employer] benefits with 19% risk score for unplanned readmission and hospitalization.  Review of medical record and MD note on 08/13/19 reveal as:   85 y.o. 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  Called to speak to patient in the room but wife Tessie Fass) answered stating that patient is hard of hearing. HIPAA information verified.  She reports that patient is expected to discharge home today.  Discussed [facility] care management services with her and verbalized not having issues so far with medications (wife- managed); pharmacy ([facility], [place]); transportation (wife or other family members); and states that she is the primary caregiver for patient at home.   Patient's wife states feeling comfortable managing patient's health needs after discharge. She is aware to contact provider for any health needs or issues that may arise. Reminded of need for patient to follow-up with primary care provider post discharge. Patient's wife denies any needs for [facility] services at this point, however, would like to be mailed [facility] brochure and information in case of future needs.   She endorses Dr. Orpah Melter with [facility] as patient's primary care provider, whose office is listed as providing transition of care follow-up.  Patient's wife was very grateful for the call.  Transition of care CM note, shows that patient will transition home with home health PT/OT ([facility]).  Of note, [facility] Care Management services does not replace or interfere with any services that  are arranged by transition of care case management or social work.     For questions and additional information, please contact:  Janeshia Ciliberto A. Mellissa Conley, BSN, RN-BC [facility] Liaison Cell: [phone]

## 2019-08-15 NOTE — Progress Notes (Signed)
Telemetry and IV's discontinued at this time. CCMD notified. Discharge instructions reviewed with patient. All questions answered. TOC pharmacy brought patient's medications to bedside.   Emelda Fear, RN

## 2019-08-15 NOTE — TOC Transition Note (Signed)
Transition of Care [facility]) - CM/SW Discharge Note Marvetta Gibbons RN, BSN Transitions of Care Unit 4E- RN Case Manager [phone]   Patient Details  Name: Cesar [name] MRN: [id number] Date of Birth: May 25, 1929  Transition of Care [facility]) CM/SW Contact:  Dawayne Patricia, RN Phone Number: 08/15/2019, 11:34 AM   Clinical Narrative:    Pt stable for transition home today- spoke with daughter via TC this am- per conversation she states her and pt's wife have spoken and they all agree pt needs to be on Eliquis and they will figure out financial piece of things to be sure pt stays on med as far as making sure he is able to pay for med and meet deductible. Daughter aware of GoodRx coupons and will assist parents as needed. The are fully aware of cost prior to deductible being met and assume this cost. They are ready for discharge. Orders placed for HHPT/OT- as per previous conversation with wife- choice for [facility] agency is [facility]- referral called to Butch Penny with [facility]- referral has been accepted. TOC to fill meds prior to discharge with 30 day free for Eliquis.     Final next level of care: [place] Barriers to Discharge: Barriers Resolved   Patient Goals and CMS Choice Patient states their goals for this hospitalization and ongoing recovery are:: return home CMS Medicare.gov Compare Post Acute Care list provided to:: Patient Represenative (must comment)(wife) Choice offered to / list presented to : Patient, Spouse  Discharge Placement             Home with [facility]          Discharge Plan and Services   Discharge Planning Services: CM Consult Post Acute Care Choice: Home Health          DME Arranged: N/A DME Agency: NA       HH Arranged: PT, OT [place] Agency: [place] ([place]) Date HH Agency Contacted: 08/15/19 Time [place]: 8115 Representative spoke with at [place]: [place] ([place]) Interventions      Readmission Risk Interventions Readmission Risk Prevention Plan 08/15/2019  Transportation Screening Complete  PCP or Specialist Appt within 5-7 Days Complete  Home Care Screening Complete  Medication Review (RN CM) Complete  Some recent data might be hidden

## 2019-08-15 NOTE — Progress Notes (Signed)
Physical Therapy Treatment Patient Details Name: Cesar [name] MRN: [id number] DOB: [birth year] Today's Date: 08/15/2019    History of Present Illness Pt admit for SUPERIOR MESENTERIC ARTERY THROMBECTOMY and Patch Angioplasty of superior messenteric artery with RIGHT femoral vein.  Post op afib.     PT Comments    Patient progressing well towards PT goals. Improved ambulation distance today with some encouragement. Requires Min guard assist for balance/safety. Continues to demonstrate fatigue and 2/4 DOE with mobility. Eager to discharge home and upset he did not yesterday. HR up to 128 bpm during activity. Difficult to fully assess cognition as pt very HOH.  Recommend close supervision for OOB mobility at home and use of RW. Will follow.   Follow Up Recommendations  Home health PT;Supervision for mobility/OOB;Supervision/Assistance - 24 hour     Equipment Recommendations  None recommended by PT    Recommendations for Other Services       Precautions / Restrictions Precautions Precautions: Fall Precaution Comments: watch HR- Afib Restrictions Weight Bearing Restrictions: No    Mobility  Bed Mobility               General bed mobility comments: Up in chair upon P Tarrival.  Transfers Overall transfer level: Needs assistance Equipment used: Rolling walker (2 wheeled) Transfers: Sit to/from Stand Sit to Stand: Min guard Stand pivot transfers: Min guard       General transfer comment: Min guard for safety. Stood from chair, good demo of hand placement.  Ambulation/Gait Ambulation/Gait assistance: Min guard Gait Distance (Feet): 80 Feet Assistive device: Rolling walker (2 wheeled) Gait Pattern/deviations: Decreased step length - right;Decreased step length - left;Shuffle;Trunk flexed;Step-through pattern Gait velocity: decreased   General Gait Details: Slow, mildly unsteady gait with RW for support; decreased foot clearance bilaterally and flexed trunk. CUes  for upright. 2/4 DOE. HR up to 128 bpm.   Stairs             Wheelchair Mobility    Modified Rankin (Stroke Patients Only)       Balance Overall balance assessment: Needs assistance Sitting-balance support: Feet supported;No upper extremity supported Sitting balance-Leahy Scale: Fair     Standing balance support: During functional activity Standing balance-Leahy Scale: Poor Standing balance comment: Needs UE support for dynamic tasks.                            Cognition Arousal/Alertness: Awake/alert Behavior During Therapy: WFL for tasks assessed/performed Overall Cognitive Status: History of cognitive impairments - at baseline                                 General Comments: Difficult to assess cognition as pt very HOH however jokes appropriately and seems [facility] for basic mobility tasks. Really wants to go home.      Exercises      General Comments        Pertinent Vitals/Pain Pain Assessment: No/denies pain    Home Living                      Prior Function            PT Goals (current goals can now be found in the care plan section) Progress towards PT goals: Progressing toward goals    Frequency    Min 3X/week      PT Plan Current plan remains appropriate  Co-evaluation              AM-PAC PT "6 Clicks" Mobility   Outcome Measure  Help needed turning from your back to your side while in a flat bed without using bedrails?: A Little Help needed moving from lying on your back to sitting on the side of a flat bed without using bedrails?: A Lot Help needed moving to and from a bed to a chair (including a wheelchair)?: A Little Help needed standing up from a chair using your arms (e.g., wheelchair or bedside chair)?: A Little Help needed to walk in hospital room?: A Little Help needed climbing 3-5 steps with a railing? : A Lot 6 Click Score: 16    End of Session Equipment Utilized During  Treatment: Gait belt Activity Tolerance: Patient tolerated treatment well Patient left: in chair;with call bell/phone within reach Nurse Communication: Mobility status PT Visit Diagnosis: Muscle weakness (generalized) (M62.81);Unsteadiness on feet (R26.81)     Time: [id number] PT Time Calculation (min) (ACUTE ONLY): 16 min  Charges:  $Gait Training: 8-22 mins                     Marisa Severin, PT, DPT Acute Rehabilitation Services Pager [phone] Office [place] 08/15/2019, 9:57 AM

## 2019-08-15 NOTE — Plan of Care (Signed)
Discharge to home °

## 2019-08-15 NOTE — Care Management Important Message (Signed)
Important Message  Patient Details  Name: Cesar [name] MRN: [id number] Date of Birth: 13-May-1929   Medicare Important Message Given:  Yes     [name] 08/15/2019, 1:26 PM

## 2019-08-15 NOTE — Discharge Summary (Signed)
Physician Discharge Summary   Patient ID: READ SCHARR [id number] 85 y.o. 1929/09/08  Admit date: 08/05/2019  Discharge date and time: 08/15/2019  2:25 PM   Admitting Physician: [name], MD   Discharge Physician: Dr. 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  CT demonstrated an embolus in the SMA.  He was taken emergently to the operating room by Dr. Scot Dock and underwent SMA embolectomy with vein patch angioplasty using right great saphenous vein.  He tolerated the procedure well and was admitted to the ICU postoperatively.  This event was felt to be due to atrial fibrillation and thus cardiology was consulted for management of atrial fibrillation and choice of anticoagulation during hospitalization.  It should be mentioned that INR was therapeutic at the time of embolization and this is the second embolic event with a therapeutic INR.  Cardiology felt, because of this, he should be switched from Coumadin to Eliquis.  Atrial fibrillation was also rate controlled throughout his hospital stay.  He was transferred out of the ICU after approximately 48 hours.  The early portion of hospital stay consisted of mobilization and advancing diet.  The second half of hospital stay involved awaiting insurance approval for coverage of Eliquis.  This process took approximately 4 to 5 days.  At the time of discharge midline incision looks to be healing well, bilateral lower extremities are well perfused, regular diet is tolerated, and Eliquis is  tolerated.  In addition to Eliquis, cardiology also recommended a beta blockade prescription and these will be managed by cardiology in follow-up.  The patient will also follow-up with Dr. Scot Dock in about 2 to 3 weeks.  It should also be noted that therapy teams recommended home health PT/OT and this was coordinated by the case manager.  He will also be prescribed 2 to 3 days of narcotic pain medication for continued postoperative pain control.  Discharge instructions were reviewed with the patient and he voices understanding.  He will be discharged this morning in stable condition.  Consults: cardiology  Treatments: surgery: Superior mesenteric artery embolectomy with vein patch angioplasty by Dr. Scot Dock on 08/05/2019  Discharge Exam: See progress note 08/15/2019 Vitals:   08/15/19 0900 08/15/19 1326  BP:  113/63  Pulse: (!) 30   Resp: (!) 25 (!) 21  Temp:  98.3 F (36.8 C)  SpO2: (!) 76% 97%     Disposition: Home with home health PT and OT  Patient Instructions:  Allergies as of 08/15/2019      Reactions   Flomax [tamsulosin Hcl] Other (See Comments)   Can't pee      Medication List    STOP taking these medications   warfarin 5 MG tablet Commonly known as: COUMADIN     TAKE these medications   apixaban 2.5 MG Tabs tablet Commonly known as: Eliquis Take 1 tablet (2.5 mg total) by mouth 2 (two) times daily.   atorvastatin 80 MG tablet Commonly known as: LIPITOR Take 1 tablet (80 mg total) by mouth daily.   b complex vitamins capsule Take 1 capsule by mouth daily.   finasteride 5 MG tablet Commonly known as: PROSCAR Take  5 mg by mouth daily.   metoprolol tartrate 25 MG tablet Commonly known as: LOPRESSOR Take 1 tablet (25 mg total) by mouth 3 (three) times daily.   oxyCODONE-acetaminophen 5-325 MG tablet Commonly known as: PERCOCET/ROXICET Take 1 tablet by mouth every 6 (six) hours as needed for moderate pain.      Activity: activity as tolerated Diet:  regular diet Wound Care: keep wound clean and dry  Follow-up with Dr. Scot Dock in 2 weeks.  SignedDagoberto Ligas 08/15/2019 3:17 PM

## 2019-08-15 NOTE — Progress Notes (Signed)
Patient ID: Cesar [name], male   DOB: [birth year], 85 y.o.   MRN: [id number] Comfortable.  Sitting up in chair asking for discharge.  Dates he is ready to go home Abdomen soft nontender Ready for discharge when issues regarding payment of Eliquis settled.

## 2019-08-16 DIAGNOSIS — I4821 Permanent atrial fibrillation: Secondary | ICD-10-CM | POA: Diagnosis not present

## 2019-08-16 DIAGNOSIS — I350 Nonrheumatic aortic (valve) stenosis: Secondary | ICD-10-CM | POA: Diagnosis not present

## 2019-08-16 DIAGNOSIS — F329 Major depressive disorder, single episode, unspecified: Secondary | ICD-10-CM | POA: Diagnosis not present

## 2019-08-16 DIAGNOSIS — K55069 Acute infarction of intestine, part and extent unspecified: Secondary | ICD-10-CM | POA: Diagnosis not present

## 2019-08-16 DIAGNOSIS — Z7901 Long term (current) use of anticoagulants: Secondary | ICD-10-CM | POA: Diagnosis not present

## 2019-08-16 DIAGNOSIS — Z95820 Peripheral vascular angioplasty status with implants and grafts: Secondary | ICD-10-CM | POA: Diagnosis not present

## 2019-08-16 DIAGNOSIS — G4733 Obstructive sleep apnea (adult) (pediatric): Secondary | ICD-10-CM | POA: Diagnosis not present

## 2019-08-16 DIAGNOSIS — Z8673 Personal history of transient ischemic attack (TIA), and cerebral infarction without residual deficits: Secondary | ICD-10-CM | POA: Diagnosis not present

## 2019-08-16 DIAGNOSIS — Z48815 Encounter for surgical aftercare following surgery on the digestive system: Secondary | ICD-10-CM | POA: Diagnosis not present

## 2019-08-16 DIAGNOSIS — I712 Thoracic aortic aneurysm, without rupture: Secondary | ICD-10-CM | POA: Diagnosis not present

## 2019-08-16 DIAGNOSIS — E7800 Pure hypercholesterolemia, unspecified: Secondary | ICD-10-CM | POA: Diagnosis not present

## 2019-08-16 DIAGNOSIS — E785 Hyperlipidemia, unspecified: Secondary | ICD-10-CM | POA: Diagnosis not present

## 2019-08-16 DIAGNOSIS — Z87891 Personal history of nicotine dependence: Secondary | ICD-10-CM | POA: Diagnosis not present

## 2019-08-16 DIAGNOSIS — N183 Chronic kidney disease, stage 3 unspecified: Secondary | ICD-10-CM | POA: Diagnosis not present

## 2019-08-16 DIAGNOSIS — H9190 Unspecified hearing loss, unspecified ear: Secondary | ICD-10-CM | POA: Diagnosis not present

## 2019-08-16 DIAGNOSIS — Z86718 Personal history of other venous thrombosis and embolism: Secondary | ICD-10-CM | POA: Diagnosis not present

## 2019-08-16 DIAGNOSIS — R918 Other nonspecific abnormal finding of lung field: Secondary | ICD-10-CM | POA: Diagnosis not present

## 2019-08-17 DIAGNOSIS — K55069 Acute infarction of intestine, part and extent unspecified: Secondary | ICD-10-CM | POA: Diagnosis not present

## 2019-08-17 DIAGNOSIS — I712 Thoracic aortic aneurysm, without rupture: Secondary | ICD-10-CM | POA: Diagnosis not present

## 2019-08-17 DIAGNOSIS — N183 Chronic kidney disease, stage 3 unspecified: Secondary | ICD-10-CM | POA: Diagnosis not present

## 2019-08-17 DIAGNOSIS — I350 Nonrheumatic aortic (valve) stenosis: Secondary | ICD-10-CM | POA: Diagnosis not present

## 2019-08-17 DIAGNOSIS — I4821 Permanent atrial fibrillation: Secondary | ICD-10-CM | POA: Diagnosis not present

## 2019-08-17 DIAGNOSIS — Z48815 Encounter for surgical aftercare following surgery on the digestive system: Secondary | ICD-10-CM | POA: Diagnosis not present

## 2019-08-21 ENCOUNTER — Telehealth: Payer: Self-pay

## 2019-08-21 ENCOUNTER — Telehealth: Payer: Self-pay | Admitting: Cardiovascular Disease

## 2019-08-21 DIAGNOSIS — I350 Nonrheumatic aortic (valve) stenosis: Secondary | ICD-10-CM | POA: Diagnosis not present

## 2019-08-21 DIAGNOSIS — I712 Thoracic aortic aneurysm, without rupture: Secondary | ICD-10-CM | POA: Diagnosis not present

## 2019-08-21 DIAGNOSIS — Z48815 Encounter for surgical aftercare following surgery on the digestive system: Secondary | ICD-10-CM | POA: Diagnosis not present

## 2019-08-21 DIAGNOSIS — K55069 Acute infarction of intestine, part and extent unspecified: Secondary | ICD-10-CM | POA: Diagnosis not present

## 2019-08-21 DIAGNOSIS — I4821 Permanent atrial fibrillation: Secondary | ICD-10-CM | POA: Diagnosis not present

## 2019-08-21 DIAGNOSIS — N183 Chronic kidney disease, stage 3 unspecified: Secondary | ICD-10-CM | POA: Diagnosis not present

## 2019-08-21 MED ORDER — APIXABAN 2.5 MG PO TABS: 2.5000 mg | ORAL_TABLET | Freq: Two times a day (BID) | ORAL | 1 refills | Status: DC

## 2019-08-21 NOTE — Telephone Encounter (Signed)
Called patient wife- explained that patient was 2.5 mg twice daily and it was approved for 5 mg. Which is what that is.  Explained to patient we would send to pharmacy- and if too expensive to let us know and we could help with patient assistance forms, she states their daughter would be coming in next week and would need to speak to a nurse to let her know what is going on with medications as they get confused. Advised I would send a message to primary nurse.

## 2019-08-21 NOTE — Telephone Encounter (Signed)
Patient's Wife is calling in regards to the patient's apixaban (ELIQUIS) 2.5 MG TABS tablet. Patient's wife stated they got a letter from their insurance stating the got approved for 5 MG instead of 2.5 MG. Wife wants to know how much medicine to give patient.

## 2019-08-22 NOTE — Telephone Encounter (Signed)
Follow Up  Eliquis medication.want to confirm dosage [employer] stated he was approved for 5 mg but when discharged received 2.5 mg twice a day  Please call to discuss

## 2019-08-23 DIAGNOSIS — I4821 Permanent atrial fibrillation: Secondary | ICD-10-CM | POA: Diagnosis not present

## 2019-08-23 DIAGNOSIS — I350 Nonrheumatic aortic (valve) stenosis: Secondary | ICD-10-CM | POA: Diagnosis not present

## 2019-08-23 DIAGNOSIS — R2681 Unsteadiness on feet: Secondary | ICD-10-CM | POA: Diagnosis not present

## 2019-08-23 DIAGNOSIS — N183 Chronic kidney disease, stage 3 unspecified: Secondary | ICD-10-CM | POA: Diagnosis not present

## 2019-08-23 DIAGNOSIS — I712 Thoracic aortic aneurysm, without rupture: Secondary | ICD-10-CM | POA: Diagnosis not present

## 2019-08-23 DIAGNOSIS — K55069 Acute infarction of intestine, part and extent unspecified: Secondary | ICD-10-CM | POA: Diagnosis not present

## 2019-08-23 DIAGNOSIS — Z48815 Encounter for surgical aftercare following surgery on the digestive system: Secondary | ICD-10-CM | POA: Diagnosis not present

## 2019-08-23 MED ORDER — APIXABAN 5 MG PO TABS: 5.0000 mg | ORAL_TABLET | Freq: Two times a day (BID) | ORAL | 11 refills | Status: DC

## 2019-08-23 NOTE — Addendum Note (Signed)
Addended by: Ricci Barker on: 08/23/2019 03:48 PM   Modules accepted: Orders

## 2019-08-23 NOTE — Telephone Encounter (Signed)
The cutoff for using the lower (2.5 mg dose) is 1.5 or higher. His last creatinine was 1.51 (and most commonly is in the 1.4 range) In view of the fact that he had thromboembolism on warfarin with INR 2.0 and since he has not had serious bleeding problems, I would recommend going to the full dose of Eliquis, 5 mg twice daily.

## 2019-08-23 NOTE — Telephone Encounter (Signed)
The daughter has been made aware that the patient should be on the 5 mg bid of the Eliquis. The new prescription has been sent into the pharmacy for them. She has been advised to call if anything further is needed.

## 2019-08-23 NOTE — Telephone Encounter (Signed)
Returned the call to the daughter per the patient's wife's request. She wanted to verify the dosage of the Eliquis the patient should be on. According to epic he was on Eliquis 5 mg bid until his lab results showed an elevated creatnine level. He was then decreased to 2.5 mg bid and was discharged from the hospital on this dosage.  The approval through [facility] was for 5 mg bid.

## 2019-08-25 DIAGNOSIS — N3941 Urge incontinence: Secondary | ICD-10-CM | POA: Diagnosis not present

## 2019-08-25 DIAGNOSIS — R350 Frequency of micturition: Secondary | ICD-10-CM | POA: Diagnosis not present

## 2019-08-25 DIAGNOSIS — N401 Enlarged prostate with lower urinary tract symptoms: Secondary | ICD-10-CM | POA: Diagnosis not present

## 2019-08-25 DIAGNOSIS — N3000 Acute cystitis without hematuria: Secondary | ICD-10-CM | POA: Diagnosis not present

## 2019-08-28 DIAGNOSIS — I4821 Permanent atrial fibrillation: Secondary | ICD-10-CM | POA: Diagnosis not present

## 2019-08-28 DIAGNOSIS — I712 Thoracic aortic aneurysm, without rupture: Secondary | ICD-10-CM | POA: Diagnosis not present

## 2019-08-28 DIAGNOSIS — I350 Nonrheumatic aortic (valve) stenosis: Secondary | ICD-10-CM | POA: Diagnosis not present

## 2019-08-28 DIAGNOSIS — K55069 Acute infarction of intestine, part and extent unspecified: Secondary | ICD-10-CM | POA: Diagnosis not present

## 2019-08-28 DIAGNOSIS — Z48815 Encounter for surgical aftercare following surgery on the digestive system: Secondary | ICD-10-CM | POA: Diagnosis not present

## 2019-08-28 DIAGNOSIS — N183 Chronic kidney disease, stage 3 unspecified: Secondary | ICD-10-CM | POA: Diagnosis not present

## 2019-08-30 DIAGNOSIS — I350 Nonrheumatic aortic (valve) stenosis: Secondary | ICD-10-CM | POA: Diagnosis not present

## 2019-08-30 DIAGNOSIS — Z48815 Encounter for surgical aftercare following surgery on the digestive system: Secondary | ICD-10-CM | POA: Diagnosis not present

## 2019-08-30 DIAGNOSIS — I4821 Permanent atrial fibrillation: Secondary | ICD-10-CM | POA: Diagnosis not present

## 2019-08-30 DIAGNOSIS — I712 Thoracic aortic aneurysm, without rupture: Secondary | ICD-10-CM | POA: Diagnosis not present

## 2019-08-30 DIAGNOSIS — K55069 Acute infarction of intestine, part and extent unspecified: Secondary | ICD-10-CM | POA: Diagnosis not present

## 2019-08-30 DIAGNOSIS — N183 Chronic kidney disease, stage 3 unspecified: Secondary | ICD-10-CM | POA: Diagnosis not present

## 2019-09-04 ENCOUNTER — Telehealth: Payer: Self-pay | Admitting: Cardiovascular Disease

## 2019-09-04 NOTE — Telephone Encounter (Signed)
Any suggestions on if these medications can cause nausea- or any suggestions.

## 2019-09-04 NOTE — Telephone Encounter (Signed)
He cannot stop the Eliquis - has had 2 embolic events in the past 6 months.  He can try stopping the metoprolol for now.  He will need to let us know in 1-2 weeks if this has helped.  If not, we will have to get samples of Xarelto (I don't think we have any) and have him transition to that before can safely stop Eliquis.

## 2019-09-04 NOTE — Telephone Encounter (Signed)
Called patient, spoke to wife- advised of message from PharmD. Patient wife verbalized understanding, will call back in 1-2 weeks.

## 2019-09-04 NOTE — Telephone Encounter (Signed)
Pt c/o medication issue:  1. Name of Medication:  apixaban (ELIQUIS) 5 MG TABS tablet metoprolol tartrate (LOPRESSOR) 25 MG tablet  2. How are you currently taking this medication (dosage and times per day)?  Eliquis: 5 mg 2x daily Metoprolol: 25 mg 3x daily  3. Are you having a reaction (difficulty breathing--STAT)? no  4. What is your medication issue?  Pt has constant nausea.  Patient was put on these medications in the hospital and has been constantly nauseated and not had an appetite since starting these medications. The wife wants to know if there is anything that can be done to alleviate these symptoms.  If the office can not reach the patient, please leave a detailed message. The wife is usually home, but if by chance she leaves the husband will not answer the phone.   If there are any med changes please send the new medication to the [facility] in [place]

## 2019-09-05 DIAGNOSIS — I712 Thoracic aortic aneurysm, without rupture: Secondary | ICD-10-CM | POA: Diagnosis not present

## 2019-09-05 DIAGNOSIS — Z48815 Encounter for surgical aftercare following surgery on the digestive system: Secondary | ICD-10-CM | POA: Diagnosis not present

## 2019-09-05 DIAGNOSIS — I350 Nonrheumatic aortic (valve) stenosis: Secondary | ICD-10-CM | POA: Diagnosis not present

## 2019-09-05 DIAGNOSIS — K55069 Acute infarction of intestine, part and extent unspecified: Secondary | ICD-10-CM | POA: Diagnosis not present

## 2019-09-05 DIAGNOSIS — N183 Chronic kidney disease, stage 3 unspecified: Secondary | ICD-10-CM | POA: Diagnosis not present

## 2019-09-05 DIAGNOSIS — I4821 Permanent atrial fibrillation: Secondary | ICD-10-CM | POA: Diagnosis not present

## 2019-09-06 ENCOUNTER — Ambulatory Visit (INDEPENDENT_AMBULATORY_CARE_PROVIDER_SITE_OTHER): Payer: Self-pay | Admitting: Vascular Surgery

## 2019-09-06 ENCOUNTER — Other Ambulatory Visit: Payer: Self-pay

## 2019-09-06 ENCOUNTER — Encounter: Payer: Self-pay | Admitting: Vascular Surgery

## 2019-09-06 VITALS — BP 136/91 | HR 120 | Temp 97.3°F | Resp 18 | Ht 73.0 in | Wt 194.2 lb

## 2019-09-06 DIAGNOSIS — K55069 Acute infarction of intestine, part and extent unspecified: Secondary | ICD-10-CM

## 2019-09-06 DIAGNOSIS — Z48812 Encounter for surgical aftercare following surgery on the circulatory system: Secondary | ICD-10-CM

## 2019-09-06 NOTE — Progress Notes (Signed)
   Patient name: Cesar [name] MRN: [id number] DOB: 1929/04/02 Sex: male  REASON FOR VISIT:   Follow-up after SMA embolectomy  HPI:   Cesar [name] is a pleasant 85 y.o. male who presented with the sudden onset of abdominal pain.  CT scan showed an embolus in the superior mesenteric artery.  He was on Coumadin for atrial fibrillation with an INR of 2.0.  He was taken urgently to the operating room for SMA embolectomy and vein patch angioplasty of the superior mesenteric artery using right great saphenous vein.  A large amount of clot was retrieved from the proximal superior mesenteric artery and also distally.  At the completion of the procedure the bowel was well perfused with good Doppler signals in the mesentery.  He was ultimately discharged on Eliquis.  Since he was discharged he is doing well and has no specific complaints.  He is tolerating his diet.  His bowels are working.  He has no significant leg pain.  Current Outpatient Medications  Medication Sig Dispense Refill  . apixaban (ELIQUIS) 5 MG TABS tablet Take 1 tablet (5 mg total) by mouth 2 (two) times daily. 60 tablet 11  . atorvastatin (LIPITOR) 80 MG tablet Take 1 tablet (80 mg total) by mouth daily. 60 tablet 3  . b complex vitamins capsule Take 1 capsule by mouth daily.    . finasteride (PROSCAR) 5 MG tablet Take 5 mg by mouth daily.    . metoprolol tartrate (LOPRESSOR) 25 MG tablet Take 1 tablet (25 mg total) by mouth 3 (three) times daily. (Patient not taking: Reported on 09/06/2019) 90 tablet 0  . oxyCODONE-acetaminophen (PERCOCET/ROXICET) 5-325 MG tablet Take 1 tablet by mouth every 6 (six) hours as needed for moderate pain. (Patient not taking: Reported on 09/06/2019) 25 tablet 0   No current facility-administered medications for this visit.     REVIEW OF SYSTEMS:  [X]  denotes positive finding, [ ]  denotes negative finding Vascular    Leg swelling    Cardiac    Chest pain or chest pressure:    Shortness of breath  upon exertion: x   Short of breath when lying flat:    Irregular heart rhythm: x   Constitutional    Fever or chills:     PHYSICAL EXAM:   Vitals:   09/06/19 0817  BP: (!) 136/91  Pulse: (!) 120  Resp: 18  Temp: (!) 97.3 F (36.3 C)  TempSrc: Temporal  SpO2: 99%  Weight: 194 lb 3.2 oz (88.1 kg)  Height: 6\' 1"  (1.854 m)    GENERAL: The patient is a well-nourished male, in no acute distress. The vital signs are documented above. CARDIOVASCULAR: There is a regular rate and rhythm. PULMONARY: There is good air exchange bilaterally without wheezing or rales. His abdominal incision is healing nicely. His vein harvest incision looks fine.  There are some slight inflammation of the distal aspect of the incision but no significant drainage or erythema. He has normal pitched bowel sounds.  DATA:   No new data  MEDICAL ISSUES:   STATUS POST SMA EMBOLECTOMY: Patient is doing well status post SMA embolectomy.  He is on Eliquis.  He has no complaints.  I will see him back as needed.  He is not a smoker.  I encouraged him to stay as active as possible.  Cesar [name] [facility] [phone]

## 2019-09-07 ENCOUNTER — Telehealth: Payer: Self-pay | Admitting: Cardiovascular Disease

## 2019-09-07 NOTE — Telephone Encounter (Signed)
Agree with staying off metoprolol, indefinitely.

## 2019-09-07 NOTE — Telephone Encounter (Signed)
New message:     Patient wife calling concering some medication changes. Please call patient wife back.

## 2019-09-07 NOTE — Telephone Encounter (Signed)
LMTCB

## 2019-09-07 NOTE — Telephone Encounter (Signed)
Pt c/o medication issue:  1. Name of Medication: metoprolol  25 mg  2. How are you currently taking this medication (dosage and times per day)? Not stop Monday  3. Are you having a reaction (difficulty breathing--STAT)? On it very sick  4. What is your medication issue? Not able to tolerate it     P

## 2019-09-07 NOTE — Telephone Encounter (Signed)
I spoke with pt's wife. She reports pt stopped metoprolol on 11/30 and has had  no nausea since stopping. (see previous phone note).   I told her pt should stay off metoprolol for now and I would send Dr [name] a message to see if wants pt to start a medication in place of metoprolol.  Wife asks we call her back with Dr Croitoru's recommendation. She states husband will not usually answer the phone.

## 2019-09-07 NOTE — Telephone Encounter (Signed)
I spoke with pt's wife and gave her information from Dr [employer].

## 2019-09-08 DIAGNOSIS — K55069 Acute infarction of intestine, part and extent unspecified: Secondary | ICD-10-CM | POA: Diagnosis not present

## 2019-09-08 DIAGNOSIS — I712 Thoracic aortic aneurysm, without rupture: Secondary | ICD-10-CM | POA: Diagnosis not present

## 2019-09-08 DIAGNOSIS — Z48815 Encounter for surgical aftercare following surgery on the digestive system: Secondary | ICD-10-CM | POA: Diagnosis not present

## 2019-09-08 DIAGNOSIS — N183 Chronic kidney disease, stage 3 unspecified: Secondary | ICD-10-CM | POA: Diagnosis not present

## 2019-09-08 DIAGNOSIS — I4821 Permanent atrial fibrillation: Secondary | ICD-10-CM | POA: Diagnosis not present

## 2019-09-08 DIAGNOSIS — I350 Nonrheumatic aortic (valve) stenosis: Secondary | ICD-10-CM | POA: Diagnosis not present

## 2019-09-20 ENCOUNTER — Encounter: Payer: Self-pay | Admitting: Cardiovascular Disease

## 2019-09-20 NOTE — Telephone Encounter (Signed)
error 

## 2019-10-20 ENCOUNTER — Telehealth: Payer: Self-pay | Admitting: Cardiovascular Disease

## 2019-10-20 NOTE — Telephone Encounter (Signed)
Pt c/o medication issue:  1. Name of Medication: apixaban (ELIQUIS) 5 MG TABS tablet  2. How are you currently taking this medication (dosage and times per day)? As directed  3. Are you having a reaction (difficulty breathing--STAT)? no  4. What is your medication issue? Patient's wife states that ever since her husband has started taking this medication she has noticed the biggest difference in him. She states that he is antsy and does not want to listen to her. She says that she cannot communicate with him and that he has currently left in his truck and she does not know where he is going.

## 2019-10-20 NOTE — Telephone Encounter (Signed)
The patient's wife has been made aware. She will call the PCP to make an appointment for further assessment.

## 2019-10-20 NOTE — Telephone Encounter (Signed)
Eliquis is NOT know to cause behavioral changes and per recent hospital admission note "the second embolic event with a therapeutic INR"   Patient will need MD assessment/authorization prior to change back to warfarin.

## 2019-10-20 NOTE — Telephone Encounter (Signed)
It's not the Eliquis

## 2019-10-20 NOTE — Telephone Encounter (Signed)
Spoke with the patient's wife. She wanted to know if Eliquis can cause a patient to be "antsy" and to "can't sit still anymore." She stated that she feels like this started when he was switched off of the warfarin. The patient's wife became distraught over the phone due to the changes since he has been discharged from the hospital.

## 2019-11-07 DIAGNOSIS — G629 Polyneuropathy, unspecified: Secondary | ICD-10-CM | POA: Diagnosis not present

## 2019-11-07 DIAGNOSIS — R413 Other amnesia: Secondary | ICD-10-CM | POA: Diagnosis not present

## 2019-11-07 DIAGNOSIS — L299 Pruritus, unspecified: Secondary | ICD-10-CM | POA: Diagnosis not present

## 2019-11-16 ENCOUNTER — Other Ambulatory Visit: Payer: Self-pay

## 2019-11-16 ENCOUNTER — Ambulatory Visit (INDEPENDENT_AMBULATORY_CARE_PROVIDER_SITE_OTHER): Payer: Medicare Other | Admitting: Neurology

## 2019-11-16 ENCOUNTER — Encounter: Payer: Self-pay | Admitting: Neurology

## 2019-11-16 VITALS — BP 127/74 | HR 77 | Temp 97.3°F | Ht 73.0 in | Wt 202.5 lb

## 2019-11-16 DIAGNOSIS — I639 Cerebral infarction, unspecified: Secondary | ICD-10-CM | POA: Diagnosis not present

## 2019-11-16 DIAGNOSIS — R410 Disorientation, unspecified: Secondary | ICD-10-CM | POA: Diagnosis not present

## 2019-11-16 MED ORDER — LEVETIRACETAM 250 MG PO TABS: 250.0000 mg | ORAL_TABLET | Freq: Two times a day (BID) | ORAL | 11 refills | Status: DC
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
## 2019-11-16 NOTE — Patient Instructions (Signed)
Please do not drive

## 2019-12-04 ENCOUNTER — Telehealth: Payer: Self-pay | Admitting: Neurology

## 2019-12-04 NOTE — Telephone Encounter (Signed)
The DMV form has been completed, signed, faxed and confirmed receipt.

## 2019-12-04 NOTE — Telephone Encounter (Signed)
Cesar [name](daughter on [employer]) is asking for a call from RN to discuss pt taking levETIRAcetam (KEPPRA) 250 MG tablet and the instructions re: pt not driving while on this medication or operating heavy equipment while on this medication.
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
## 2019-12-06 ENCOUNTER — Other Ambulatory Visit: Payer: Medicare Other

## 2019-12-27 ENCOUNTER — Other Ambulatory Visit: Payer: Self-pay

## 2019-12-27 ENCOUNTER — Ambulatory Visit (INDEPENDENT_AMBULATORY_CARE_PROVIDER_SITE_OTHER): Payer: Medicare Other | Admitting: Neurology

## 2019-12-27 ENCOUNTER — Other Ambulatory Visit: Payer: Medicare Other

## 2019-12-27 DIAGNOSIS — I639 Cerebral infarction, unspecified: Secondary | ICD-10-CM

## 2019-12-27 DIAGNOSIS — R410 Disorientation, unspecified: Secondary | ICD-10-CM | POA: Diagnosis not present

## 2020-01-04 DIAGNOSIS — R609 Edema, unspecified: Secondary | ICD-10-CM | POA: Diagnosis not present

## 2020-01-10 NOTE — Procedures (Signed)
   HISTORY: 86 year old male presented with episode of sudden onset confusion, language difficulty  TECHNIQUE:  This is a routine 16 channel EEG recording with one channel devoted to a limited EKG recording.  It was performed during wakefulness, drowsiness and asleep.  Photic stimulation were performed as activating procedures.  There are minimum muscle and movement artifact noted.  Upon maximum arousal, posterior dominant waking rhythm consistent of mildly dysrhythmic mixed alpha and theta range activity, activities are symmetric over the bilateral posterior derivations and attenuated with eye opening.  Hyperventilation was not performed  Photic stimulation did not alter the tracing.  During EEG recording, patient developed drowsiness and entered sleep, sleep EEG demonstrated architecture, there were frontal centrally dominant vertex waves and symmetric sleep spindles noted.  During EEG recording, there was no epileptiform discharge noted.  EKG demonstrate sinus rhythm, with heart rate of 64 bpm  CONCLUSION: This is an essentially normal EEG.  There is no electrodiagnostic evidence of epileptiform discharge.  Marcial Pacas, M.D. Ph.D.  [facility] [place], [place] [address] Phone: [phone] Fax:      [phone]

## 2020-01-19 ENCOUNTER — Telehealth (INDEPENDENT_AMBULATORY_CARE_PROVIDER_SITE_OTHER): Payer: Medicare Other | Admitting: Cardiovascular Disease

## 2020-01-19 ENCOUNTER — Telehealth: Payer: Self-pay | Admitting: *Deleted

## 2020-01-19 ENCOUNTER — Encounter: Payer: Self-pay | Admitting: Cardiovascular Disease

## 2020-01-19 VITALS — BP 151/80 | HR 61 | Ht 74.0 in | Wt 200.0 lb

## 2020-01-19 DIAGNOSIS — I712 Thoracic aortic aneurysm, without rupture: Secondary | ICD-10-CM | POA: Diagnosis not present

## 2020-01-19 DIAGNOSIS — I4821 Permanent atrial fibrillation: Secondary | ICD-10-CM | POA: Diagnosis not present

## 2020-01-19 DIAGNOSIS — I739 Peripheral vascular disease, unspecified: Secondary | ICD-10-CM | POA: Diagnosis not present

## 2020-01-19 DIAGNOSIS — I7121 Aneurysm of the ascending aorta, without rupture: Secondary | ICD-10-CM

## 2020-01-19 DIAGNOSIS — Z7901 Long term (current) use of anticoagulants: Secondary | ICD-10-CM | POA: Diagnosis not present

## 2020-01-19 DIAGNOSIS — E7800 Pure hypercholesterolemia, unspecified: Secondary | ICD-10-CM

## 2020-01-19 DIAGNOSIS — I350 Nonrheumatic aortic (valve) stenosis: Secondary | ICD-10-CM | POA: Diagnosis not present

## 2020-01-19 NOTE — Progress Notes (Signed)
Virtual Visit via Telephone Note   This visit type was conducted due to national recommendations for restrictions regarding the COVID-19 Pandemic (e.g. social distancing) in an effort to limit this patient's exposure and mitigate transmission in our community.  Due to his co-morbid illnesses, this patient is at least at moderate risk for complications without adequate follow up.  This format is felt to be most appropriate for this patient at this time.  The patient did not have access to video technology/had technical difficulties with video requiring transitioning to audio format only (telephone).  All issues noted in this document were discussed and addressed.  No physical exam could be performed with this format.  Please refer to the patient's chart for his  consent to telehealth for [facility].   Date:  01/19/2020   ID:  Cesar [name], DOB 1928-11-02, MRN [id number]  Patient Location: Home Provider Location: Office  PCP:  Orpah Melter, MD  Cardiologist:  [name], MD  Electrophysiologist:  None   Evaluation Performed:  Follow-Up Visit  Chief Complaint:  AS, Ao aneurysm, AFib  History of Present Illness:    Cesar [name] is a 86 y.o. male with a moderate aneurysm of the ascending aorta (stable at 4.5 cm in size on serial CT), moderate aortic stenosis, permanent atrial fibrillation with history of previous stroke and systemic embolism and hyperlipidemia.  He is very hard of hearing, but both his wife and his daughter were present for the conversation today.  He has had numerous arterial embolic events:  - Imaging studies show a chronic left MCA territory infarction.  - On March 16, 2019 had acute facial droop and slurred speech, that resolved spontaneously in less than an hour.   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 Scot Dock.  INR was  borderline low at 2.0.  He was switched to Eliquis at that time.  - On November 16, 2019 he had sudden onset of confusion, speech difficulty.  This was highly suspicious for partial seizure and he was started on Keppra  He has not had any major health problems since then.  His family reports that he does have some pedal edema that mostly resolves after overnight rest and is worse towards the end of the day.  He is pretty sedentary, but still works in his workshop, does some welding and has no difficulty moving his stools around.  He is unsteady on his feet and prefers to use his golf cart between the house in the workshop, rather than walking.  He has mild orthostatic dizziness when he gets up but has not had falls or syncope.  He denies exertional dyspnea, exertional chest pain or exertional syncope.  He does not have intermittent claudication and has not had any new neurological events.  He has not had serious injuries or bleeding problems and is compliant with anticoagulation.  There have been no recent problems with hematuria.  In the past, temporary interruption of his warfarin for hematuria led to an embolic event to his left iliac artery.    Cesar [name] had a follow-up echocardiogram on July 31, 2019.  It shows progression of the aortic stenosis which is now moderate.  The mean gradient was 27 mmHg, the calculated valve area was 1 cm.  The dimensionless valve index was actually in the severe range of 0.2.  He continues to have normal left  ventricular systolic function.  He has mild-moderate concentric LVH.  His left atrium is massively dilated.  He does have reduced arterial flow to the left lower extremity with an ABI of 0.82. He has never required cardiac catheterization. He had a mildly abnormal nuclear stress test in 2003 suggestive of lateral wall ischemia, but I don't think this was ever associated with symptoms.  The patient does not have symptoms concerning for COVID-19 infection (fever,  chills, cough, or new shortness of breath).    Past Medical History:  Diagnosis Date   AAA (abdominal aortic aneurysm) ([place])    Possibly an erroneous entry: only imaging i see is 2017 and specifically shows "no aneurysmal dilation" of abdominal aorta   Aortic stenosis    a. mild-mod by echo 06/2016.   Atrial fibrillation ([place])    CKD (chronic kidney disease), stage III    baseline Cr 1.4-1.5 in 2016-2017 by PCP)   CVA (cerebral infarction)    Depression    DVT (deep venous thrombosis) ([place]) 2006   Dyslipidemia    Hematuria    HOH (hard of hearing)    Hypercholesterolemia    OSA (obstructive sleep apnea)    intol to CPAP   Permanent atrial fibrillation (H[facility])    Prostate enlargement    Pulmonary nodules    Stroke (cerebrum) ([place])    Thoracic ascending aortic aneurysm ([place])    4.5cm in 11/2018, stable since 2016   TIA (transient ischemic attack)    Past Surgical History:  Procedure Laterality Date   EMBOLECTOMY Left 07/02/2016   Procedure: EMBOLECTOMY LEFT FEMORAL ARTERY;  Surgeon: [name], MD;  Location: [facility];  Service: Vascular;  Laterality: Left;   MESENTERIC ARTERY BYPASS N/A 08/06/2019   Procedure: SUPERIOR MESENTERIC ARTERY THROMBECTOMY;  Surgeon: [name], MD;  Location[facility] OR;  Service: Vascular;  Laterality: N/A;   [place]  08/24/2002   markedly positive,subtle lateral ischemia   PATCH ANGIOPLASTY  08/06/2019   Procedure: Patch Angioplasty of superior messenteric artery with RIGHT femoral vein;  Surgeon: [name], MD;  Location: [facility];  Service: Vascular;;   PROSTATE SURGERY     US ECHOCARDIOGRAPHY  01/06/2012   mod. AOV ca+,mild to mod. AS,mod mostly posterior MAC-mild MR     Current Meds  Medication Sig   apixaban (ELIQUIS) 5 MG TABS tablet Take 1 tablet (5 mg total) by mouth 2 (two) times daily.   b complex vitamins capsule Take 1 capsule by mouth daily.   finasteride (PROSCAR) 5 MG  tablet Take 5 mg by mouth daily.   levETIRAcetam (KEPPRA) 250 MG tablet Take 1 tablet (250 mg total) by mouth 2 (two) times daily.     Allergies:   Flomax [tamsulosin hcl]   Social History   Tobacco Use   Smoking status: Former Smoker    Packs/day: 1.00    Years: 10.00    Pack years: 10.00    Types: Cigarettes    Quit date: 10/05/1952    Years since quitting: 67.3   Smokeless tobacco: Former Systems developer    Quit date: 10/05/1976  Substance Use Topics   Alcohol use: Yes    Alcohol/week: 0.0 standard drinks    Comment: occasional beer   Drug use: No     Family Hx: The patient's family history includes Diabetes in his father; Heart failure in his mother; Stroke in his father.  ROS:   Please see the history of present illness.  All other systems reviewed and are negative.   Prior CV studies:   The following studies were reviewed today: CT angiogram of the aorta and visit with CV surgery  Labs/Other Tests and Data Reviewed:    EKG:  ECG from July 19 2018 shows permanent atrial fibrillation with controlled ventricular response and incomplete left bundle branch block  Recent Labs: 08/09/2019: Magnesium 2.0 08/12/2019: ALT 26 08/14/2019: Hemoglobin 8.8; Platelets 229 08/15/2019: BUN 27; Creatinine, Ser 1.51; Potassium 4.8; Sodium 139   Recent Lipid Panel Lab Results  Component Value Date/Time   CHOL 154 03/16/2019 04:46 AM   TRIG 116 08/08/2019 04:17 AM   HDL 38 (L) 03/16/2019 04:46 AM   CHOLHDL 4.1 03/16/2019 04:46 AM   LDLCALC 94 03/16/2019 04:46 AM  05/22/2019 total cholesterol 146, triglycerides 124, HDL 43, LDL 78  Wt Readings from Last 3 Encounters:  01/19/20 200 lb (90.7 kg)  11/16/19 202 lb 8 oz (91.9 kg)  09/06/19 194 lb 3.2 oz (88.1 kg)     Objective:    Vital Signs:  BP (!) 151/80    Pulse 61    Ht _0  (1.88 m)    Wt 200 lb (90.7 kg)    BMI 25.68 kg/m    VITAL SIGNS:  reviewed Unable to examine  ASSESSMENT & PLAN:    ASSESSMENT:    1.  Permanent atrial fibrillation ([place])   2. Thoracic ascending aortic aneurysm ([place])   3. Aortic valve stenosis, nonrheumatic   4. PAD (peripheral artery disease) ([place])   5. Long term current use of anticoagulant   6. Hypercholesterolemia      PLAN:  In order of problems listed above:  1. AFib: Permanent arrhythmia, spontaneously well rate controlled, consistent with advanced AV node conduction disease. CHADSVasc at least 5 (age 12, CVA 2, PAD). Continue anticoagulation.  Note that he developed embolism when warfarin was interrupted for hematuria.  Anticoagulation should only be interrupted when critically necessary and for the shortest possible amount of time. 2. Asc Ao Aneurysm: stable at 4.5 cm in diameter, followed by Dr. Gilford Raid.  No plan for serial imaging studies since he has not shown any progression in aneurysm dimensions and he would not be a good candidate for surgical repair. 3. AS: This has progressed to the moderate to severe range.  He does not appear to have any exertional symptoms, but is fairly sedentary.  Asked his family to promptly report if he develops exertional dyspnea, exertional angina or syncope.  We had planned for him to have an echocardiogram just before this appointment, but somehow that was not scheduled.  Since he remains asymptomatic we will plan to check an echocardiogram in October.  I do not think [place] would be a candidate for surgical aortic valve replacement, but he might be a reasonable candidate for TAVR.  We discussed that in a little detail today. 4. PAD: He avoids walking long distances but for his level of activity is free of claudication.  ABI 0.8 on the left, diminished following his episodes of iliac artery embolism.  Also has mild nonobstructive carotid disease. 5. Anticoagulation: Currently on Eliquis after having a systemic embolic events while on warfarin with INR 2.0.  He has not had any falls or serious bleeding problems. Avoid interrupting  except for serious situations, since he has had previous embolic events. 6. HLP: On maximum dose of atorvastatin, with acceptable LDL cholesterol level.  7. Edema: I would like to avoid prescribing diuretics since he has  symptoms strongly suggestive of orthostatic hypotension and is at high risk for complications with falls.  Keep legs elevated as much as possible during the daytime.  For the same reason, I think we should tolerate systolic blood pressure in the 140-150 range.  COVID-19 Education: The signs and symptoms of COVID-19 were discussed with the patient and how to seek care for testing (follow up with PCP or arrange E-visit).  The importance of social distancing was discussed today.  Time:   Today, I have spent 23 minutes with the patient with telehealth technology discussing the above problems.     Medication Adjustments/Labs and Tests Ordered: Current medicines are reviewed at length with the patient today.  Concerns regarding medicines are outlined above.   Tests Ordered: Orders Placed This Encounter  Procedures   ECHOCARDIOGRAM COMPLETE    Medication Changes: No orders of the defined types were placed in this encounter.   Follow Up:  In Person 1 year  Signed, [name], MD  08/02/2019 2:25 PM    [facility] Medication Adjustments/Labs and Tests Ordered: Current medicines are reviewed at length with the patient today.  Concerns regarding medicines are outlined above.  Medication changes, Labs and Tests ordered today are listed in the Patient Instructions below. Patient Instructions  Medication Instructions:  No changes *If you need a refill on your cardiac medications before your next appointment, please call your pharmacy*   Lab Work: None ordered If you have labs (blood work) drawn today and your tests are completely normal, you will receive your results only by:  [place] (if you have MyChart) OR  A paper copy in the  mail If you have any lab test that is abnormal or we need to change your treatment, we will call you to review the results.   Testing/Procedures: Your physician has requested that you have an echocardiogram in October. Echocardiography is a painless test that uses sound waves to create images of your heart. It provides your doctor with information about the size and shape of your heart and how well your hearts chambers and valves are working. You may receive an ultrasound enhancing agent through an IV if needed to better visualize your heart during the echo.This procedure takes approximately one hour. There are no restrictions for this procedure. This will take place at the [address], [address].     Follow-Up: At [facility], you and your health needs are our priority.  As part of our continuing mission to provide you with exceptional heart care, we have created designated Provider Care Teams.  These Care Teams include your primary Cardiologist (physician) and Advanced Practice Providers (APPs -  Physician Assistants and Nurse Practitioners) who all work together to provide you with the care you need, when you need it.  We recommend signing up for the patient portal called "MyChart".  Sign up information is provided on this After Visit Summary.  MyChart is used to connect with patients for Virtual Visits (Telemedicine).  Patients are able to view lab/test results, encounter notes, upcoming appointments, etc.  Non-urgent messages can be sent to your provider as well.   To learn more about what you can do with MyChart, go to [email or web address].    Your next appointment:   Follow up in person after the echo has been completed.  Special Instructions: Keep your legs elevated when possible.     Signed, [name], MD  01/19/2020 10:49 AM    [facility] [place], [place], [place]  [address] Phone: [phone]; Fax: [phone]

## 2020-01-19 NOTE — Patient Instructions (Addendum)
Medication Instructions:  No changes *If you need a refill on your cardiac medications before your next appointment, please call your pharmacy*   Lab Work: None ordered If you have labs (blood work) drawn today and your tests are completely normal, you will receive your results only by: [place] MyChart Message (if you have MyChart) OR . A paper copy in the mail If you have any lab test that is abnormal or we need to change your treatment, we will call you to review the results.   Testing/Procedures: Your physician has requested that you have an echocardiogram in October. Echocardiography is a painless test that uses sound waves to create images of your heart. It provides your doctor with information about the size and shape of your heart and how well your heart's chambers and valves are working. You may receive an ultrasound enhancing agent through an IV if needed to better visualize your heart during the echo.This procedure takes approximately one hour. There are no restrictions for this procedure. This will take place at the [address].     Follow-Up: At [facility], you and your health needs are our priority.  As part of our continuing mission to provide you with exceptional heart care, we have created designated Provider Care Teams.  These Care Teams include your primary Cardiologist (physician) and Advanced Practice Providers (APPs -  Physician Assistants and Nurse Practitioners) who all work together to provide you with the care you need, when you need it.  We recommend signing up for the patient portal called "MyChart".  Sign up information is provided on this After Visit Summary.  MyChart is used to connect with patients for Virtual Visits (Telemedicine).  Patients are able to view lab/test results, encounter notes, upcoming appointments, etc.  Non-urgent messages can be sent to your provider as well.   To learn more about what you can do with MyChart, go to  [email or web address].    Your next appointment:   Follow up in person after the echo has been completed.  Special Instructions: Keep your legs elevated when possible.

## 2020-01-19 NOTE — Telephone Encounter (Signed)
  Patient Consent for Virtual Visit         Cesar [name] has provided verbal consent on 01/19/2020 for a virtual visit (video or telephone).   CONSENT FOR VIRTUAL VISIT FOR:  Cesar [name]  By participating in this virtual visit I agree to the following:  I hereby voluntarily request, consent and authorize [facility] and its employed or contracted physicians, physician assistants, nurse practitioners or other licensed health care professionals (the Practitioner), to provide me with telemedicine health care services (the "Services") as deemed necessary by the treating Practitioner. I acknowledge and consent to receive the Services by the Practitioner via telemedicine. I understand that the telemedicine visit will involve communicating with the Practitioner through live audiovisual communication technology and the disclosure of certain medical information by electronic transmission. I acknowledge that I have been given the opportunity to request an in-person assessment or other available alternative prior to the telemedicine visit and am voluntarily participating in the telemedicine visit.  I understand that I have the right to withhold or withdraw my consent to the use of telemedicine in the course of my care at any time, without affecting my right to future care or treatment, and that the Practitioner or I may terminate the telemedicine visit at any time. I understand that I have the right to inspect all information obtained and/or recorded in the course of the telemedicine visit and may receive copies of available information for a reasonable fee.  I understand that some of the potential risks of receiving the Services via telemedicine include:  [place] Kitchen Delay or interruption in medical evaluation due to technological equipment failure or disruption; . Information transmitted may not be sufficient (e.g. poor resolution of images) to allow for appropriate medical decision making by the Practitioner;  and/or  . In rare instances, security protocols could fail, causing a breach of personal health information.  Furthermore, I acknowledge that it is my responsibility to provide information about my medical history, conditions and care that is complete and accurate to the best of my ability. I acknowledge that Practitioner's advice, recommendations, and/or decision may be based on factors not within their control, such as incomplete or inaccurate data provided by me or distortions of diagnostic images or specimens that may result from electronic transmissions. I understand that the practice of medicine is not an exact science and that Practitioner makes no warranties or guarantees regarding treatment outcomes. I acknowledge that a copy of this consent can be made available to me via my patient portal ([place]), or I can request a printed copy by calling the office of [facility].    I understand that my insurance will be billed for this visit.   I have read or had this consent read to me. . I understand the contents of this consent, which adequately explains the benefits and risks of the Services being provided via telemedicine.  . I have been provided ample opportunity to ask questions regarding this consent and the Services and have had my questions answered to my satisfaction. . I give my informed consent for the services to be provided through the use of telemedicine in my medical care

## 2020-02-14 DIAGNOSIS — L570 Actinic keratosis: Secondary | ICD-10-CM | POA: Diagnosis not present

## 2020-02-14 DIAGNOSIS — X32XXXD Exposure to sunlight, subsequent encounter: Secondary | ICD-10-CM | POA: Diagnosis not present

## 2020-03-08 DIAGNOSIS — T1501XA Foreign body in cornea, right eye, initial encounter: Secondary | ICD-10-CM | POA: Diagnosis not present

## 2020-03-11 DIAGNOSIS — T1501XD Foreign body in cornea, right eye, subsequent encounter: Secondary | ICD-10-CM | POA: Diagnosis not present

## 2020-03-19 ENCOUNTER — Ambulatory Visit: Payer: Medicare Other | Admitting: Neurology
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
## 2020-04-15 ENCOUNTER — Encounter: Payer: Self-pay | Admitting: Neurology

## 2020-04-15 ENCOUNTER — Other Ambulatory Visit: Payer: Self-pay

## 2020-04-15 ENCOUNTER — Ambulatory Visit (INDEPENDENT_AMBULATORY_CARE_PROVIDER_SITE_OTHER): Payer: Medicare Other | Admitting: Neurology

## 2020-04-15 VITALS — BP 126/68 | HR 65 | Ht 73.0 in | Wt 192.0 lb

## 2020-04-15 DIAGNOSIS — R269 Unspecified abnormalities of gait and mobility: Secondary | ICD-10-CM

## 2020-04-15 DIAGNOSIS — I639 Cerebral infarction, unspecified: Secondary | ICD-10-CM | POA: Diagnosis not present
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
## 2020-04-15 NOTE — Patient Instructions (Signed)
Continue Keppra at current dosing Document any episodes  See you back in 6 months

## 2020-05-21 NOTE — Progress Notes (Signed)
I have reviewed and agreed above plan. 

## 2020-06-04 DIAGNOSIS — I639 Cerebral infarction, unspecified: Secondary | ICD-10-CM | POA: Diagnosis not present

## 2020-06-04 DIAGNOSIS — I4891 Unspecified atrial fibrillation: Secondary | ICD-10-CM | POA: Diagnosis not present

## 2020-06-04 DIAGNOSIS — N400 Enlarged prostate without lower urinary tract symptoms: Secondary | ICD-10-CM | POA: Diagnosis not present

## 2020-06-04 DIAGNOSIS — F322 Major depressive disorder, single episode, severe without psychotic features: Secondary | ICD-10-CM | POA: Diagnosis not present

## 2020-06-04 DIAGNOSIS — E7800 Pure hypercholesterolemia, unspecified: Secondary | ICD-10-CM | POA: Diagnosis not present

## 2020-06-05 ENCOUNTER — Telehealth: Payer: Self-pay | Admitting: Neurology

## 2020-06-05 DIAGNOSIS — H31001 Unspecified chorioretinal scars, right eye: Secondary | ICD-10-CM | POA: Diagnosis not present

## 2020-06-05 DIAGNOSIS — H524 Presbyopia: Secondary | ICD-10-CM | POA: Diagnosis not present

## 2020-06-05 DIAGNOSIS — H04123 Dry eye syndrome of bilateral lacrimal glands: Secondary | ICD-10-CM | POA: Diagnosis not present

## 2020-06-05 DIAGNOSIS — H43813 Vitreous degeneration, bilateral: Secondary | ICD-10-CM | POA: Diagnosis not present

## 2020-06-05 NOTE — Telephone Encounter (Signed)
Please see note, patient on Keppra 250 mg twice daily, high risk partial seizure, history of stroke.  Presented in June 2020 with onset confusion, language difficulty, lasting less than 1 hour, suspicious for partial seizure.   Do you think XR Keppra have have potentially less effect for the patient, or consider switch to something else all together? Lamictal?

## 2020-06-05 NOTE — Telephone Encounter (Signed)
I called and spoke to wife.  I relayed that I had appt for MM/NP for 06-06-20 and she relayed that she rely's on others to bring them and she would prefer to see SS/NP.  I relayed will place on waitlist and my list at my desk if cancellation comes up.  She verbalized understanding.

## 2020-06-05 NOTE — Telephone Encounter (Signed)
I called Catilin, clinical pharmacist for [facility] and works with Dr. Olen Pel.  She had spoken to wife about how he was doing and wife stated that he has become more argumentative, disagreeable to wife and family.  Believe that this has started several months from being on keppra,2-2021and more wrosening since last 2 months.  Last seen 7-/2021 with you and was not mentioned at time due to wife not comfortable stating with husband in room.  Consider change?  Need revisit to discuss?

## 2020-06-05 NOTE — Telephone Encounter (Signed)
Probably easiest to switch to another medication with in office visit. See if he can bring most recent lab work, can make sure liver function is adequate if we consider Depakote.

## 2020-06-05 NOTE — Telephone Encounter (Signed)
If consider switching Keppra due to behavior side effect, change from regular preparation to xr is less likely going to make any difference  May consider lamotrigine or  Depakote

## 2020-06-05 NOTE — Telephone Encounter (Signed)
[facility]) Pt was prescribed levETIRAcetam (KEPPRA) 250 MG tablet months ago. Pt's wife has noticed change in personality; argumentative,  disagreeable. If change in personality is related to this medication is there an alterative medication?

## 2020-06-11 DIAGNOSIS — M25562 Pain in left knee: Secondary | ICD-10-CM | POA: Diagnosis not present

## 2020-06-11 DIAGNOSIS — M1712 Unilateral primary osteoarthritis, left knee: Secondary | ICD-10-CM | POA: Diagnosis not present

## 2020-06-12 ENCOUNTER — Telehealth: Payer: Self-pay | Admitting: *Deleted

## 2020-06-12 NOTE — Telephone Encounter (Signed)
I called wife and offered appts tomoorw with SS/NP.  She will check with son and call in am if can come.  She appreciated trying to help get him  In.

## 2020-06-26 ENCOUNTER — Telehealth: Payer: Self-pay | Admitting: Cardiovascular Disease

## 2020-06-26 NOTE — Telephone Encounter (Signed)
Left message for Cesar [name], dr croitoru or lisa his nurse is not in the office today but will forward this information and have them follow up with her if fax received.

## 2020-06-26 NOTE — Telephone Encounter (Signed)
Cesar [name] from [place] states a few days ago they faxed a cost assistance form for eliquis. She would like to know if the fax was received.

## 2020-06-27 DIAGNOSIS — E7800 Pure hypercholesterolemia, unspecified: Secondary | ICD-10-CM | POA: Diagnosis not present

## 2020-06-27 DIAGNOSIS — I639 Cerebral infarction, unspecified: Secondary | ICD-10-CM | POA: Diagnosis not present

## 2020-06-27 DIAGNOSIS — N400 Enlarged prostate without lower urinary tract symptoms: Secondary | ICD-10-CM | POA: Diagnosis not present

## 2020-06-27 DIAGNOSIS — I4891 Unspecified atrial fibrillation: Secondary | ICD-10-CM | POA: Diagnosis not present

## 2020-06-27 DIAGNOSIS — F322 Major depressive disorder, single episode, severe without psychotic features: Secondary | ICD-10-CM | POA: Diagnosis not present

## 2020-06-27 NOTE — Telephone Encounter (Signed)
Eliquis assistance forms have been faxed back to [place] at [phone]

## 2020-07-03 ENCOUNTER — Telehealth (HOSPITAL_COMMUNITY): Payer: Self-pay | Admitting: Cardiovascular Disease

## 2020-07-03 ENCOUNTER — Telehealth: Payer: Self-pay | Admitting: Cardiovascular Disease

## 2020-07-03 NOTE — Telephone Encounter (Signed)
Ok to delay, but when things are better, he needs to get it done before the end of the year. Thanks.

## 2020-07-03 NOTE — Telephone Encounter (Signed)
Patient's wanted to let Dr. [name] know that she cancelled his echo due to covid going around. She states that he is 86 years old and she is scared for him to go out.

## 2020-07-03 NOTE — Telephone Encounter (Signed)
07/03/20 Pt wife cancelled appt due to [place] going around and will call later to reschedule per scheduler. Order will be removed from the [place] and when she calls back to reschedule we will reinstate the order. Patient has now cancelled appt 2 times.

## 2020-07-08 NOTE — Telephone Encounter (Signed)
Caren Griffins at [facility] has been made aware that they need to fax over the patient authorization forms to [place].  She stated that this will be done today.

## 2020-07-11 ENCOUNTER — Telehealth: Payer: Self-pay | Admitting: *Deleted

## 2020-07-11 NOTE — Telephone Encounter (Signed)
I called wife, I had availability for 07-15-20 for SS/NP 1115 and 1315.  She could not get pt in there, relied on family to bring them, she had too much on her plate at the moment.  Would like to be kept on list.

## 2020-07-24 ENCOUNTER — Other Ambulatory Visit (HOSPITAL_COMMUNITY): Payer: Medicare Other

## 2020-07-26 ENCOUNTER — Other Ambulatory Visit (HOSPITAL_COMMUNITY): Payer: Medicare Other

## 2020-07-29 ENCOUNTER — Telehealth: Payer: Self-pay | Admitting: Neurology

## 2020-07-29 NOTE — Telephone Encounter (Signed)
I called and spoke to daughter, Kelly Splinter.  She is stating that he is super active, starts something then breaks then leaves it, has had falls related to this.(4 within the last month), irritabilty.      She feels like it is a progressive worsening thing.  She thinks possible SE of keppra?  Has been on keppra since prior 04/2020.  He has not see pcp since 11/2019.  I recommended seeing pcp to r/u metabolic/infectious issues.  I offered 1415 today she lives 4 hours away needs to have some time prior to coming (mother is having medical issue, anxious) seeing pcp this week with daughter.  Pt is on cancellation list.  Any suggestions?

## 2020-07-29 NOTE — Telephone Encounter (Signed)
If having side effect of Keppra, may consider switch to Depakote ER 250 mg at bedtime to start. See PCP first r/o acute abnormality, see if they will check labs, make sure liver enzymes okay for Depakote. Still schedule follow-up visit, if PCP finds no acute change, we can make med adjustment and plan for follow-up in few weeks. Keppra can certainly cause anxiety/irritability.

## 2020-07-29 NOTE — Telephone Encounter (Signed)
Pt's daughter, Greggory Stallion (on [place]) called, would like to discuss my father's behavior. He's getting very hyperactive, has  fallen 4 times in the last month. Has no patience with my mother. Does levETIRAcetam (KEPPRA) 250 MG tablet cause behavioral issues. Would like a call from the nurse.

## 2020-07-29 NOTE — Telephone Encounter (Signed)
I called daughter of pt.  Will look for appt. Instructed on recommendations possible depakote Er 250mg  qhs after seeing pcp for eval, acute issues and labs (LFTs). See's Dr. Olen Pel at [facility].  Fax to [place] labs and note, then will call to see about treatment.  She verbalized understanding.

## 2020-08-02 DIAGNOSIS — I639 Cerebral infarction, unspecified: Secondary | ICD-10-CM | POA: Diagnosis not present

## 2020-08-02 DIAGNOSIS — I4891 Unspecified atrial fibrillation: Secondary | ICD-10-CM | POA: Diagnosis not present

## 2020-08-02 DIAGNOSIS — N400 Enlarged prostate without lower urinary tract symptoms: Secondary | ICD-10-CM | POA: Diagnosis not present

## 2020-08-02 DIAGNOSIS — F322 Major depressive disorder, single episode, severe without psychotic features: Secondary | ICD-10-CM | POA: Diagnosis not present

## 2020-08-02 DIAGNOSIS — E7800 Pure hypercholesterolemia, unspecified: Secondary | ICD-10-CM | POA: Diagnosis not present

## 2020-08-02 NOTE — Telephone Encounter (Signed)
Assistance has been approved through 10/04/20 for Eliquis

## 2020-08-08 ENCOUNTER — Telehealth: Payer: Self-pay | Admitting: *Deleted

## 2020-08-08 NOTE — Telephone Encounter (Signed)
LMVM for [place] concerning possible appt for her father on 08-12-20 at 1045 or 1315 on sarah's schedule.  Please call.

## 2020-08-08 NOTE — Telephone Encounter (Signed)
Spoke to daughter.  She will come with pt 08-12-20 at 1045.  Has not seen pcp, appt 08-20-20.  ? SE keppra causing behavioral changes as per previous note.

## 2020-08-09 DIAGNOSIS — F322 Major depressive disorder, single episode, severe without psychotic features: Secondary | ICD-10-CM | POA: Diagnosis not present

## 2020-08-09 DIAGNOSIS — N400 Enlarged prostate without lower urinary tract symptoms: Secondary | ICD-10-CM | POA: Diagnosis not present

## 2020-08-09 DIAGNOSIS — I639 Cerebral infarction, unspecified: Secondary | ICD-10-CM | POA: Diagnosis not present

## 2020-08-09 DIAGNOSIS — E7800 Pure hypercholesterolemia, unspecified: Secondary | ICD-10-CM | POA: Diagnosis not present

## 2020-08-09 DIAGNOSIS — I4891 Unspecified atrial fibrillation: Secondary | ICD-10-CM | POA: Diagnosis not present

## 2020-08-12 ENCOUNTER — Ambulatory Visit (INDEPENDENT_AMBULATORY_CARE_PROVIDER_SITE_OTHER): Payer: Medicare Other | Admitting: Neurology

## 2020-08-12 ENCOUNTER — Encounter: Payer: Self-pay | Admitting: Neurology

## 2020-08-12 VITALS — BP 116/70 | Ht 73.0 in | Wt 190.0 lb

## 2020-08-12 DIAGNOSIS — R410 Disorientation, unspecified: Secondary | ICD-10-CM | POA: Diagnosis not present

## 2020-08-12 DIAGNOSIS — I639 Cerebral infarction, unspecified: Secondary | ICD-10-CM
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
## 2020-08-12 NOTE — Patient Instructions (Addendum)
Check liver function today  If okay tomorrow, will add in Depakote  Will give you instructions for how to taper off Keppra and start the Depakote See you back in 4 months

## 2020-08-13 ENCOUNTER — Telehealth: Payer: Self-pay | Admitting: Neurology

## 2020-08-13 LAB — COMPREHENSIVE METABOLIC PANEL WITH GFR
ALT: 18 [IU]/L (ref 0–44)
AST: 24 [IU]/L (ref 0–40)
Albumin/Globulin Ratio: 1.7 (ref 1.2–2.2)
Albumin: 4.2 g/dL (ref 3.6–4.6)
Alkaline Phosphatase: 75 [IU]/L (ref 44–121)
BUN/Creatinine Ratio: 18 (ref 10–24)
BUN: 24 mg/dL (ref 8–27)
Bilirubin Total: 0.9 mg/dL (ref 0.0–1.2)
CO2: 22 mmol/L (ref 20–29)
Calcium: 9.1 mg/dL (ref 8.6–10.2)
Chloride: 108 mmol/L — ABNORMAL HIGH (ref 96–106)
Creatinine, Ser: 1.35 mg/dL — ABNORMAL HIGH (ref 0.76–1.27)
GFR calc Af Amer: 53 mL/min/{1.73_m2} — ABNORMAL LOW
GFR calc non Af Amer: 46 mL/min/{1.73_m2} — ABNORMAL LOW
Globulin, Total: 2.5 g/dL (ref 1.5–4.5)
Glucose: 104 mg/dL — ABNORMAL HIGH (ref 65–99)
Potassium: 4.8 mmol/L (ref 3.5–5.2)
Sodium: 142 mmol/L (ref 134–144)
Total Protein: 6.7 g/dL (ref 6.0–8.5)

## 2020-08-13 MED ORDER — DIVALPROEX SODIUM ER 500 MG PO TB24: 500.0000 mg | ORAL_TABLET | Freq: Every day | ORAL | 11 refills | Status: DC

## 2020-08-13 NOTE — Telephone Encounter (Signed)
I called pts daughter, Myrle Sheng.  Relayed the lab results, plan for depakote er 500mg  po qhs for once week, along with Jodi Marble 250mg  po bid.  Once on depakote for a week then can stop keppra.  She verbalized understanding. Will call back as needed.  Appreciated Judson Roch, NP.

## 2020-08-13 NOTE — Telephone Encounter (Signed)
LMVM for daughter, Myrle Sheng to return call for lab results and medication instructions.

## 2020-08-13 NOTE — Telephone Encounter (Signed)
CMP shows stable elevated creatinine 1.35, improved from 1 year earlier, liver enzymes are normal.  Have him start Depakote ER 500 mg (talked with Dr. Krista Blue about 250 vs 500 mg dosing, 500 mg is preferable) at bedtime for [redacted] week along with Keppra 250 mg twice daily, after on Depakote for 1 week, then stop the Keppra.

## 2020-08-22 ENCOUNTER — Ambulatory Visit: Payer: Medicare Other | Admitting: Cardiovascular Disease

## 2020-08-26 NOTE — Progress Notes (Signed)
I have reviewed and agreed above plan. 

## 2020-09-04 DIAGNOSIS — N401 Enlarged prostate with lower urinary tract symptoms: Secondary | ICD-10-CM | POA: Diagnosis not present

## 2020-09-04 DIAGNOSIS — N3941 Urge incontinence: Secondary | ICD-10-CM | POA: Diagnosis not present

## 2020-09-04 DIAGNOSIS — R310 Gross hematuria: Secondary | ICD-10-CM | POA: Diagnosis not present

## 2020-09-05 DIAGNOSIS — I639 Cerebral infarction, unspecified: Secondary | ICD-10-CM | POA: Diagnosis not present

## 2020-09-05 DIAGNOSIS — E7800 Pure hypercholesterolemia, unspecified: Secondary | ICD-10-CM | POA: Diagnosis not present

## 2020-09-05 DIAGNOSIS — F322 Major depressive disorder, single episode, severe without psychotic features: Secondary | ICD-10-CM | POA: Diagnosis not present

## 2020-09-05 DIAGNOSIS — I4891 Unspecified atrial fibrillation: Secondary | ICD-10-CM | POA: Diagnosis not present

## 2020-09-05 DIAGNOSIS — N400 Enlarged prostate without lower urinary tract symptoms: Secondary | ICD-10-CM | POA: Diagnosis not present

## 2020-10-02 ENCOUNTER — Encounter: Payer: Self-pay | Admitting: Medical

## 2020-10-02 ENCOUNTER — Other Ambulatory Visit: Payer: Self-pay

## 2020-10-02 ENCOUNTER — Ambulatory Visit (INDEPENDENT_AMBULATORY_CARE_PROVIDER_SITE_OTHER): Payer: Medicare Other | Admitting: Medical

## 2020-10-02 ENCOUNTER — Telehealth: Payer: Self-pay | Admitting: Cardiovascular Disease

## 2020-10-02 VITALS — BP 123/81 | HR 95 | Ht 73.0 in | Wt 196.2 lb

## 2020-10-02 DIAGNOSIS — I739 Peripheral vascular disease, unspecified: Secondary | ICD-10-CM | POA: Diagnosis not present

## 2020-10-02 DIAGNOSIS — I712 Thoracic aortic aneurysm, without rupture: Secondary | ICD-10-CM | POA: Diagnosis not present

## 2020-10-02 DIAGNOSIS — E7800 Pure hypercholesterolemia, unspecified: Secondary | ICD-10-CM

## 2020-10-02 DIAGNOSIS — I4821 Permanent atrial fibrillation: Secondary | ICD-10-CM | POA: Diagnosis not present

## 2020-10-02 DIAGNOSIS — I350 Nonrheumatic aortic (valve) stenosis: Secondary | ICD-10-CM

## 2020-10-02 DIAGNOSIS — I639 Cerebral infarction, unspecified: Secondary | ICD-10-CM

## 2020-10-02 DIAGNOSIS — I7121 Aneurysm of the ascending aorta, without rupture: Secondary | ICD-10-CM

## 2020-10-02 NOTE — Telephone Encounter (Signed)
Patient's wife is calling to follow up. She states during the patient's appointment today with Judy Pimple, he was advised to see his PCP to have his cholesterol checked. She states she contacted the patient's PCP, [facility], and she was told that their office does not take orders from other practices. Patient's wife states she is not sure what needs to be done now and she would like to know if we are able to contact patient's PCP to discuss the importance of having his cholesterol checked. Please return call to patient and patient's wife with updates at [phone]. If unavailable, please leave a detailed voice message.

## 2020-10-02 NOTE — Progress Notes (Signed)
Cardiology Office Note   Date:  10/02/2020   ID:  Cesar [name], DOB 06/20/1929, MRN [id number]  PCP:  Orpah Melter, MD  Cardiologist:  [name], MD EP: None  Chief Complaint  Patient presents with  . Follow-up    Atrial fibrillation and aortic stenosis      History of Present Illness: Cesar [name] is a 86 y.o. 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   He was last evaluated by cardiology via a telemedicine visit with Dr. [name] 01/2020, at which time he was noted to have occasional orthostatic dizziness and pedal edema at the end of the day, otherwise without significant cardiac complaints. He was recommended to update an echocardiogram 07/2020 for close monitoring of his AS. 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 His last echocardiogram 07/2019 showed EF 60-65%, moderate LVH, severe biatrial enlargement, mild MR, and moderate AS (mean gradient 33 mmHg). He was noted to have a remote NST in 2003 suggesting lateral wall ischemia, however with lack of associated symptoms further testing was not pursued.   He presents today with his wife for routine follow-up. He has continued to do well from a cardiac standpoint. He is still working in his shop, currently splitting wood for the winter. Wife reports occasional DOE, though this does not occur very often and is not lifestyle limiting. He continues to have occasional LE edema, particularly in the evenings which resolves  by morning. He has no complaints of chest pain, SOB at rest, palpitations, dizziness, lightheadedness, syncope, orthopnea, PND, or claudication. He is vaccinated against COVID-19.       Past Medical History:  Diagnosis Date  . AAA (abdominal aortic aneurysm) ([place])    Possibly an erroneous entry: only imaging i see is 2017 and specifically shows "no aneurysmal dilation" of abdominal aorta  . Aortic stenosis    a. mild-mod by echo 06/2016.  Marland Kitchen Atrial fibrillation ([place])   . CKD (chronic kidney disease), stage III ([facility])    baseline Cr 1.4-1.5 in 2016-2017 by PCP)  . CVA (cerebral infarction)   . Depression   . DVT (deep venous thrombosis) ([place]) 2006  . Dyslipidemia   . Hematuria   . HOH (hard of hearing)   . Hypercholesterolemia   . OSA (obstructive sleep apnea)    intol to CPAP  . Permanent atrial fibrillation ([place])   . Prostate enlargement   . Pulmonary nodules   . Stroke (cerebrum) ([place])   . Thoracic ascending aortic aneurysm ([place])    4.5cm in 11/2018, stable since 2016  . TIA (transient ischemic attack)     Past Surgical History:  Procedure Laterality Date  . EMBOLECTOMY Left 07/02/2016   Procedure: EMBOLECTOMY LEFT FEMORAL ARTERY;  Surgeon: [name], MD;  Location: [facility];  Service: Vascular;  Laterality: Left;  Marland Kitchen MESENTERIC ARTERY BYPASS N/A 08/06/2019   Procedure: SUPERIOR MESENTERIC ARTERY THROMBECTOMY;  Surgeon: [name], MD;  Location: [facility];  Service: Vascular;  Laterality: N/A;  . NM [place]  08/24/2002   markedly positive,subtle  lateral ischemia  . PATCH ANGIOPLASTY  08/06/2019   Procedure: Patch Angioplasty of superior messenteric artery with RIGHT femoral vein;  Surgeon: [name], MD;  Location: [facility];  Service: Vascular;;  . PROSTATE SURGERY    . US ECHOCARDIOGRAPHY  01/06/2012   mod. AOV ca+,mild to mod. AS,mod mostly posterior MAC-mild MR     Current Outpatient Medications  Medication Sig Dispense Refill  .  apixaban (ELIQUIS) 5 MG TABS tablet Take 1 tablet (5 mg total) by mouth 2 (two) times daily. 60 tablet 11  . b complex vitamins capsule Take 1 capsule by mouth daily.    . divalproex (DEPAKOTE ER) 500 MG 24 hr tablet Take 1 tablet (500 mg total) by mouth at bedtime. 30 tablet 11  . finasteride (PROSCAR) 5 MG tablet Take 5 mg by mouth daily.    Marland Kitchen atorvastatin (LIPITOR) 80 MG tablet Take 1 tablet (80 mg total) by mouth daily. 60 tablet 3   No current facility-administered medications for this visit.    Allergies:   Flomax [tamsulosin hcl]    Social History:  The patient  reports that he quit smoking about 68 years ago. His smoking use included cigarettes. He has a 10.00 pack-year smoking history. He quit smokeless tobacco use about 44 years ago. He reports current alcohol use. He reports that he does not use drugs.   Family History:  The patient's family history includes Diabetes in his father; Heart failure in his mother; Stroke in his father.    ROS:  Please see the history of present illness.   Otherwise, review of systems are positive for none.   All other systems are reviewed and negative.    PHYSICAL EXAM: VS:  BP 123/81 (BP Location: Left Arm, Patient Position: Sitting)   Pulse 95   Ht _0  (1.854 m)   Wt 196 lb 3.2 oz (89 kg)   SpO2 95%   BMI 25.89 kg/m  , BMI Body mass index is 25.89 kg/m. GEN: Well nourished, well developed, in no acute distress HEENT: sclera anicteric Neck: no JVD, carotid bruits, or masses Cardiac: RRR; +murmur, rubs, or gallops, trace LE edema  Respiratory:  clear to auscultation bilaterally, normal work of breathing GI: soft, nontender, nondistended, + BS MS: no deformity or atrophy Skin: warm and dry, no rash Neuro:  Strength and sensation are intact Psych: euthymic mood, full affect   EKG:  EKG is ordered today. The ekg ordered today demonstrates suspected limb lead reversal with atrial fibrillation, rate 95 bpm, LVH, no STE/D, no significant  change from previous   Recent Labs: 08/12/2020: ALT 18; BUN 24; Creatinine, Ser 1.35; Potassium 4.8; Sodium 142    Lipid Panel    Component Value Date/Time   CHOL 154 03/16/2019 0446   TRIG 116 08/08/2019 0417   HDL 38 (L) 03/16/2019 0446   CHOLHDL 4.1 03/16/2019 0446   VLDL 22 03/16/2019 0446   LDLCALC 94 03/16/2019 0446      Wt Readings from Last 3 Encounters:  10/02/20 196 lb 3.2 oz (89 kg)  08/12/20 190 lb (86.2 kg)  04/15/20 192 lb (87.1 kg)      Other studies Reviewed: Additional studies/ records that were reviewed today include:   Echocardiogram 07/2019: 1. Left ventricular ejection fraction, by visual estimation, is 60 to  65%. The left ventricle has normal function. Normal left ventricular size.  There is moderately increased left ventricular hypertrophy.  2. Elevated mean left atrial pressure.  3. Global right  ventricle has normal systolic function.The right  ventricular size is mildly enlarged.  4. Left atrial size was severely dilated.  5. Right atrial size was severely dilated.  6. Moderate mitral annular calcification.  7. The mitral valve is normal in structure. Mild mitral valve  regurgitation. No evidence of mitral stenosis.  8. The tricuspid valve is normal in structure. Tricuspid valve  regurgitation is mild.  9. The aortic valve is tricuspid Aortic valve regurgitation is trivial by  color flow Doppler. Moderate aortic valve stenosis.  10. The pulmonic valve was normal in structure. Pulmonic valve  regurgitation is mild by color flow Doppler.  11. Aortic dilatation noted.  12. There is mild dilatation of the aortic root measuring 39 mm.  13. Normal LV systolic function; moderate LVH; mildly dilated aortic root;  severe biatrial enlargement; mild RVE; moderate AS (mean gradient 33 mmHg)  and trace AI; mild MR.     ASSESSMENT AND PLAN:  1. Permanent atrial fibrillation: rates well controlled without AV nodal blocking agents. He has no  complaints of bleeding on eliquis. Historically has had an embolic event in the setting of interrupted anticoagulation - Continue eliquis for stroke ppx with limited interruptions only when critically necessary  2. Aortic stenosis: progressed to the moderate-severe range on last echo 07/2019. Due for annual monitoring however has not completed this study. No chest pain, DOE, or syncope  - Will update an echocardiogram at this time  3. Ascending aortic aneurysm: followed by Dr. [name]. Stable at 4.5cm on serial CT - Continue monitoring per Dr. [name]   4. PAD: abnormal left ABI's following his iliac artery embolism though no claudication. Also with mild carotid artery disease.  - Continue statin  5. HLD: LDL 94 03/2019 - Continue atorvastatin  6. LE edema: continues to have intermittent edema, particularly at the end of the day. Given orthostatic hypotension and fall risk favor ongoing conservative management - Encouraged low sodium diet, LE elevation, and compression stockings    Current medicines are reviewed at length with the patient today.  The patient does not have concerns regarding medicines.  The following changes have been made:  As above  Labs/ tests ordered today include:   Orders Placed This Encounter  Procedures  . EKG 12-Lead  . ECHOCARDIOGRAM COMPLETE     Disposition:   FU with Dr. [name] in 6 months  Signed, Abigail Butts, PA-C  10/02/2020 12:43 PM

## 2020-10-02 NOTE — Progress Notes (Signed)
Thanks! Definitely needs that echo

## 2020-10-02 NOTE — Telephone Encounter (Signed)
Spoke with Pt's wife, Sharmarke Cicio (ok per DPR), wife expressed to Judy Pimple, PA that they would be able to have labs done at his PCP office, however pt doesn't have an upcoming appointment with PCP and they do not typically do labs based on another provider's suggestions. Explained to wife that labs can be done at our office or at any [facility] location in the area. Pt's wife explains that she can not drive back to our office, that when he has appointments this far into [place] she has someone else drive.  Pt's wife was willing to explore this option of another [facility] location but when this RN told her the closest [facility] location to her house she got very upset and started crying on the phone.  Pt's wife states that she just has way too much going on, she can't keep things straight and it's stressful and keeps her up at night. Pt's wife asks "can't you just call them (PCP) and see if they will do it?" Told pt that this RN would call their PCP office and see what can be done. Pt's wife is very emotional at this point. Expressed to [name] that this RN is only trying to help the situation.   Called Dr. Lenise Arena' office- spoke with the secretary. Dr. Lenise Arena' nurse not available to speak, left message.  Secretary re-iterates that their office does not take orders from outside offices but says she will pass along the message.  Spoke with on site Child psychotherapist as well. She is willing to reach out to the family and office cone transportation to help remedy this situation. Lab orders placed.

## 2020-10-02 NOTE — Patient Instructions (Signed)
Medication Instructions:  Your physician recommends that you continue on your current medications as directed. Please refer to the Current Medication list given to you today.  *If you need a refill on your cardiac medications before your next appointment, please call your pharmacy*  Lab Work: NONE ordered at this time of appointment   If you have labs (blood work) drawn today and your tests are completely normal, you will receive your results only by: Marland Kitchen MyChart Message (if you have MyChart) OR . A paper copy in the mail If you have any lab test that is abnormal or we need to change your treatment, we will call you to review the results.  Testing/Procedures: Your physician has requested that you have an echocardiogram. Echocardiography is a painless test that uses sound waves to create images of your heart. It provides your doctor with information about the size and shape of your heart and how well your heart's chambers and valves are working. This procedure takes approximately one hour. There are no restrictions for this procedure.   Please schedule for 3-4 weeks  Follow-Up: At [facility], you and your health needs are our priority.  As part of our continuing mission to provide you with exceptional heart care, we have created designated Provider Care Teams.  These Care Teams include your primary Cardiologist (physician) and Advanced Practice Providers (APPs -  Physician Assistants and Nurse Practitioners) who all work together to provide you with the care you need, when you need it.  Your next appointment:   6 month(s)  The format for your next appointment:   In Person  Provider:   Thurmon Fair, MD  Other Instructions

## 2020-10-03 ENCOUNTER — Telehealth: Payer: Self-pay | Admitting: Licensed Clinical Social Worker

## 2020-10-03 NOTE — Progress Notes (Signed)
Heart and Vascular Care Navigation  10/03/2020  Cesar [name] 1928-12-20 [id number]  Reason for Referral:  Transportation challenges                                                                                                    Assessment:    CSW received referral that pt wife was generally concerned about having to drive to have pt's labs drawn. It had been explained to her that pt could return to our office or to go to a [facility] closer to their home. Pt wife had been insistent to see if it could be done at PCP office. CSW was updated that PCP office will be able to do the labs this time however in the future they may not be able to assist. CSW reached out to pt wife Cesar [name] this morning at [phone]. Introduced self, role, reason for call. Pt wife shared that it is just her and pt at their home, they both are generally still pretty active but it worries her how much she has on her plate and she has a hard time logistically managing it all. Their daughter Cesar [name] lives in [place] and comes back to the [place] about 1x a month, their son Cesar [name] is retired and has been assisting them with transportation to appointments. CSW shared that [facility] could be an alternative for pt to get to appointments should their son not be able to take them. I also shared that perhaps morning appointments may be more conducive for their needs and that way Mr. Napolitano would not have to fast all day and would hopefully not have to schedule another appointment just for labs. Pt wife states understanding, she is very overwhelmed at this time- I provided verbal support and encouragement to reach out to this writer should she have any additional questions or need support with logistics.                                    HRT/VAS Care Coordination    Patients Home Cardiology Office [facility]   Living arrangements for the past 2 months Single Family Home   Lives with: Spouse   Patient  Current Optometrist; Traditional Medicare   Patient Has Concern With Paying Medical Bills No   Does Patient Have Prescription Coverage? Yes   Home Assistive Devices/Equipment None   DME Agency NA   HH Agency [facility]   Current home services DME      Social History:                                                                             SDOH Screenings   Alcohol Screen: Not  on file  Depression (PHQ2-9): Not on file  Financial Resource Strain: Not on file  Food Insecurity: Not on file  Housing: Not on file  Physical Activity: Not on file  Social Connections: Not on file  Stress: Not on file  Tobacco Use: Medium Risk  . Smoking Tobacco Use: Former Smoker  . Smokeless Tobacco Use: Former Dispensing optician Needs: No Transportation Needs  . Lack of Transportation (Medical): No  . Lack of Transportation (Non-Medical): No    SDOH Interventions: Transportation:   Transportation Interventions: Retail banker    Follow-up plan:   CSW sent a letter to pt home address with both my card and the card for [employer] so that pt and pt wife can review what we spoke about and if they are interested they can reach out to me to refer them for transportation services. I remain available moving forward for any questions/concerns.

## 2020-10-03 NOTE — Telephone Encounter (Signed)
Late entry: spoke with Angie from PCP (Dr. Lenise Arena office), office willing to make a one time exception in the best interest of the pt.  Angie has been in touch with [name] (pt's wife), instructed her as soon as they get the orders from our office they can schedule an appointment for Cesar [name] to get his labs drawn. This RN has faxed orders for labs to PCP office with diagnostic code with confirmed fax notice.

## 2020-10-08 DIAGNOSIS — E7800 Pure hypercholesterolemia, unspecified: Secondary | ICD-10-CM | POA: Diagnosis not present

## 2020-10-16 ENCOUNTER — Inpatient Hospital Stay (HOSPITAL_COMMUNITY)
Admission: EM | Admit: 2020-10-16 | Discharge: 2020-10-23 | DRG: 267 | Disposition: A | Discharge status: Home / Self Care | Payer: Medicare Other | Attending: Internal Medicine | Admitting: Internal Medicine

## 2020-10-16 ENCOUNTER — Emergency Department (HOSPITAL_COMMUNITY): Payer: Medicare Other

## 2020-10-16 ENCOUNTER — Other Ambulatory Visit (HOSPITAL_COMMUNITY): Payer: Medicare Other

## 2020-10-16 ENCOUNTER — Encounter (HOSPITAL_COMMUNITY): Payer: Self-pay | Admitting: Internal Medicine

## 2020-10-16 DIAGNOSIS — I517 Cardiomegaly: Secondary | ICD-10-CM | POA: Diagnosis not present

## 2020-10-16 DIAGNOSIS — G40909 Epilepsy, unspecified, not intractable, without status epilepticus: Secondary | ICD-10-CM | POA: Diagnosis present

## 2020-10-16 DIAGNOSIS — I4811 Longstanding persistent atrial fibrillation: Secondary | ICD-10-CM | POA: Diagnosis not present

## 2020-10-16 DIAGNOSIS — Z952 Presence of prosthetic heart valve: Secondary | ICD-10-CM

## 2020-10-16 DIAGNOSIS — Z8249 Family history of ischemic heart disease and other diseases of the circulatory system: Secondary | ICD-10-CM

## 2020-10-16 DIAGNOSIS — I4891 Unspecified atrial fibrillation: Secondary | ICD-10-CM | POA: Diagnosis not present

## 2020-10-16 DIAGNOSIS — I700 Atherosclerosis of aorta: Secondary | ICD-10-CM | POA: Diagnosis not present

## 2020-10-16 DIAGNOSIS — J90 Pleural effusion, not elsewhere classified: Secondary | ICD-10-CM | POA: Diagnosis present

## 2020-10-16 DIAGNOSIS — R55 Syncope and collapse: Secondary | ICD-10-CM | POA: Diagnosis present

## 2020-10-16 DIAGNOSIS — I350 Nonrheumatic aortic (valve) stenosis: Secondary | ICD-10-CM

## 2020-10-16 DIAGNOSIS — I2510 Atherosclerotic heart disease of native coronary artery without angina pectoris: Secondary | ICD-10-CM | POA: Diagnosis present

## 2020-10-16 DIAGNOSIS — H9190 Unspecified hearing loss, unspecified ear: Secondary | ICD-10-CM | POA: Diagnosis present

## 2020-10-16 DIAGNOSIS — Z823 Family history of stroke: Secondary | ICD-10-CM | POA: Diagnosis not present

## 2020-10-16 DIAGNOSIS — Z7901 Long term (current) use of anticoagulants: Secondary | ICD-10-CM

## 2020-10-16 DIAGNOSIS — R911 Solitary pulmonary nodule: Secondary | ICD-10-CM | POA: Diagnosis not present

## 2020-10-16 DIAGNOSIS — Z01810 Encounter for preprocedural cardiovascular examination: Secondary | ICD-10-CM | POA: Diagnosis not present

## 2020-10-16 DIAGNOSIS — E785 Hyperlipidemia, unspecified: Secondary | ICD-10-CM | POA: Diagnosis present

## 2020-10-16 DIAGNOSIS — G4733 Obstructive sleep apnea (adult) (pediatric): Secondary | ICD-10-CM | POA: Diagnosis present

## 2020-10-16 DIAGNOSIS — R4182 Altered mental status, unspecified: Secondary | ICD-10-CM | POA: Diagnosis not present

## 2020-10-16 DIAGNOSIS — Z86718 Personal history of other venous thrombosis and embolism: Secondary | ICD-10-CM

## 2020-10-16 DIAGNOSIS — D649 Anemia, unspecified: Secondary | ICD-10-CM | POA: Diagnosis not present

## 2020-10-16 DIAGNOSIS — I083 Combined rheumatic disorders of mitral, aortic and tricuspid valves: Principal | ICD-10-CM | POA: Diagnosis present

## 2020-10-16 DIAGNOSIS — W19XXXA Unspecified fall, initial encounter: Secondary | ICD-10-CM | POA: Diagnosis not present

## 2020-10-16 DIAGNOSIS — N400 Enlarged prostate without lower urinary tract symptoms: Secondary | ICD-10-CM | POA: Diagnosis present

## 2020-10-16 DIAGNOSIS — I4821 Permanent atrial fibrillation: Secondary | ICD-10-CM | POA: Diagnosis present

## 2020-10-16 DIAGNOSIS — I639 Cerebral infarction, unspecified: Secondary | ICD-10-CM | POA: Diagnosis not present

## 2020-10-16 DIAGNOSIS — S060X9A Concussion with loss of consciousness of unspecified duration, initial encounter: Secondary | ICD-10-CM | POA: Diagnosis present

## 2020-10-16 DIAGNOSIS — R011 Cardiac murmur, unspecified: Secondary | ICD-10-CM | POA: Diagnosis present

## 2020-10-16 DIAGNOSIS — J9811 Atelectasis: Secondary | ICD-10-CM | POA: Diagnosis present

## 2020-10-16 DIAGNOSIS — Z66 Do not resuscitate: Secondary | ICD-10-CM | POA: Diagnosis present

## 2020-10-16 DIAGNOSIS — N183 Chronic kidney disease, stage 3 unspecified: Secondary | ICD-10-CM | POA: Diagnosis not present

## 2020-10-16 DIAGNOSIS — Z87891 Personal history of nicotine dependence: Secondary | ICD-10-CM

## 2020-10-16 DIAGNOSIS — L89322 Pressure ulcer of left buttock, stage 2: Secondary | ICD-10-CM | POA: Diagnosis present

## 2020-10-16 DIAGNOSIS — N281 Cyst of kidney, acquired: Secondary | ICD-10-CM | POA: Diagnosis not present

## 2020-10-16 DIAGNOSIS — E872 Acidosis: Secondary | ICD-10-CM | POA: Diagnosis not present

## 2020-10-16 DIAGNOSIS — I712 Thoracic aortic aneurysm, without rupture: Secondary | ICD-10-CM | POA: Diagnosis present

## 2020-10-16 DIAGNOSIS — Z006 Encounter for examination for normal comparison and control in clinical research program: Secondary | ICD-10-CM | POA: Diagnosis not present

## 2020-10-16 DIAGNOSIS — Z8673 Personal history of transient ischemic attack (TIA), and cerebral infarction without residual deficits: Secondary | ICD-10-CM | POA: Diagnosis not present

## 2020-10-16 DIAGNOSIS — I447 Left bundle-branch block, unspecified: Secondary | ICD-10-CM | POA: Diagnosis present

## 2020-10-16 DIAGNOSIS — F32A Depression, unspecified: Secondary | ICD-10-CM | POA: Diagnosis present

## 2020-10-16 DIAGNOSIS — K5730 Diverticulosis of large intestine without perforation or abscess without bleeding: Secondary | ICD-10-CM | POA: Diagnosis not present

## 2020-10-16 DIAGNOSIS — I351 Nonrheumatic aortic (valve) insufficiency: Secondary | ICD-10-CM | POA: Diagnosis not present

## 2020-10-16 DIAGNOSIS — I70203 Unspecified atherosclerosis of native arteries of extremities, bilateral legs: Secondary | ICD-10-CM | POA: Diagnosis not present

## 2020-10-16 DIAGNOSIS — I482 Chronic atrial fibrillation, unspecified: Secondary | ICD-10-CM | POA: Diagnosis not present

## 2020-10-16 DIAGNOSIS — Z20822 Contact with and (suspected) exposure to covid-19: Secondary | ICD-10-CM | POA: Diagnosis present

## 2020-10-16 DIAGNOSIS — K862 Cyst of pancreas: Secondary | ICD-10-CM | POA: Diagnosis not present

## 2020-10-16 DIAGNOSIS — L8990 Pressure ulcer of unspecified site, unspecified stage: Secondary | ICD-10-CM | POA: Insufficient documentation

## 2020-10-16 DIAGNOSIS — Z79899 Other long term (current) drug therapy: Secondary | ICD-10-CM

## 2020-10-16 DIAGNOSIS — I129 Hypertensive chronic kidney disease with stage 1 through stage 4 chronic kidney disease, or unspecified chronic kidney disease: Secondary | ICD-10-CM | POA: Diagnosis present

## 2020-10-16 DIAGNOSIS — N1832 Chronic kidney disease, stage 3b: Secondary | ICD-10-CM | POA: Diagnosis present

## 2020-10-16 HISTORY — DX: Personal history of other venous thrombosis and embolism: Z86.718

## 2020-10-16 HISTORY — DX: Transient cerebral ischemic attack, unspecified: G45.9

## 2020-10-16 LAB — URINALYSIS, ROUTINE W REFLEX MICROSCOPIC
Bacteria, UA: NONE SEEN
Bilirubin Urine: NEGATIVE
Glucose, UA: NEGATIVE mg/dL
Hgb urine dipstick: NEGATIVE
Ketones, ur: NEGATIVE mg/dL
Nitrite: NEGATIVE
Protein, ur: NEGATIVE mg/dL
Specific Gravity, Urine: 1.013 (ref 1.005–1.030)
pH: 7 (ref 5.0–8.0)

## 2020-10-16 LAB — BLOOD GAS, VENOUS
Acid-Base Excess: 1.6 mmol/L (ref 0.0–2.0)
Bicarbonate: 26.2 mmol/L (ref 20.0–28.0)
Drawn by: 1390
FIO2: 21
O2 Saturation: 31.3 %
Patient temperature: 37
pCO2, Ven: 45 mmHg (ref 44.0–60.0)
pH, Ven: 7.382 (ref 7.250–7.430)
pO2, Ven: <31 mmHg — CL (ref 32.0–45.0)

## 2020-10-16 LAB — COMPREHENSIVE METABOLIC PANEL
ALT: 29 U/L (ref 0–44)
AST: 40 U/L (ref 15–41)
Albumin: 3.4 g/dL — ABNORMAL LOW (ref 3.5–5.0)
Alkaline Phosphatase: 61 U/L (ref 38–126)
Anion gap: 12 (ref 5–15)
BUN: 25 mg/dL — ABNORMAL HIGH (ref 8–23)
CO2: 19 mmol/L — ABNORMAL LOW (ref 22–32)
Calcium: 9.1 mg/dL (ref 8.9–10.3)
Chloride: 108 mmol/L (ref 98–111)
Creatinine, Ser: 1.46 mg/dL — ABNORMAL HIGH (ref 0.61–1.24)
GFR, Estimated: 46 mL/min — ABNORMAL LOW (ref >60)
Glucose, Bld: 108 mg/dL — ABNORMAL HIGH (ref 70–99)
Potassium: 4.7 mmol/L (ref 3.5–5.1)
Sodium: 139 mmol/L (ref 135–145)
Total Bilirubin: 1.3 mg/dL — ABNORMAL HIGH (ref 0.3–1.2)
Total Protein: 6.6 g/dL (ref 6.5–8.1)

## 2020-10-16 LAB — CBC WITH DIFFERENTIAL/PLATELET
Abs Immature Granulocytes: 0.02 10*3/uL (ref 0.00–0.07)
Basophils Absolute: 0 10*3/uL (ref 0.0–0.1)
Basophils Relative: 1 %
Eosinophils Absolute: 0.1 10*3/uL (ref 0.0–0.5)
Eosinophils Relative: 2 %
HCT: 39.8 % (ref 39.0–52.0)
Hemoglobin: 12.7 g/dL — ABNORMAL LOW (ref 13.0–17.0)
Immature Granulocytes: 0 %
Lymphocytes Relative: 10 %
Lymphs Abs: 0.6 10*3/uL — ABNORMAL LOW (ref 0.7–4.0)
MCH: 31.8 pg (ref 26.0–34.0)
MCHC: 31.9 g/dL (ref 30.0–36.0)
MCV: 99.7 fL (ref 80.0–100.0)
Monocytes Absolute: 0.6 10*3/uL (ref 0.1–1.0)
Monocytes Relative: 9 %
Neutro Abs: 4.6 10*3/uL (ref 1.7–7.7)
Neutrophils Relative %: 78 %
Platelets: 139 10*3/uL — ABNORMAL LOW (ref 150–400)
RBC: 3.99 MIL/uL — ABNORMAL LOW (ref 4.22–5.81)
RDW: 13.4 % (ref 11.5–15.5)
WBC: 6 10*3/uL (ref 4.0–10.5)
nRBC: 0 % (ref 0.0–0.2)

## 2020-10-16 LAB — RESP PANEL BY RT-PCR (RSV, FLU A&B, COVID)  RVPGX2
Influenza A by PCR: NEGATIVE
Influenza B by PCR: NEGATIVE
Resp Syncytial Virus by PCR: NEGATIVE
SARS Coronavirus 2 by RT PCR: NEGATIVE

## 2020-10-16 LAB — MAGNESIUM: Magnesium: 2.2 mg/dL (ref 1.7–2.4)

## 2020-10-16 LAB — VALPROIC ACID LEVEL: Valproic Acid Lvl: <10 ug/mL — ABNORMAL LOW (ref 50.0–100.0)

## 2020-10-16 LAB — TSH: TSH: 2.149 u[IU]/mL (ref 0.350–4.500)

## 2020-10-16 LAB — VITAMIN B12: Vitamin B-12: 734 pg/mL (ref 180–914)

## 2020-10-16 MED ORDER — SODIUM CHLORIDE 0.9% FLUSH
3.0000 mL | Freq: Two times a day (BID) | INTRAVENOUS | Status: DC
  Administered 2020-10-17 – 2020-10-21 (×7): 3 mL via INTRAVENOUS

## 2020-10-16 MED ORDER — HEPARIN SODIUM (PORCINE) 5000 UNIT/ML IJ SOLN: 5000.0000 [IU] | Freq: Two times a day (BID) | INTRAMUSCULAR | Status: DC

## 2020-10-16 MED ORDER — ACETAMINOPHEN 650 MG RE SUPP: 650.0000 mg | Freq: Four times a day (QID) | RECTAL | Status: DC | PRN

## 2020-10-16 MED ORDER — HYDRALAZINE HCL 25 MG PO TABS: 25.0000 mg | ORAL_TABLET | Freq: Four times a day (QID) | ORAL | Status: DC | PRN

## 2020-10-16 MED ORDER — APIXABAN 5 MG PO TABS
5.0000 mg | ORAL_TABLET | Freq: Two times a day (BID) | ORAL | Status: DC
  Administered 2020-10-16 – 2020-10-18 (×4): 5 mg via ORAL
  Filled 2020-10-16 (×4): qty 1

## 2020-10-16 MED ORDER — ACETAMINOPHEN 325 MG PO TABS
650.0000 mg | ORAL_TABLET | Freq: Four times a day (QID) | ORAL | Status: DC | PRN
  Filled 2020-10-16: qty 2

## 2020-10-16 MED ORDER — DIVALPROEX SODIUM ER 500 MG PO TB24
500.0000 mg | ORAL_TABLET | Freq: Every day | ORAL | Status: DC
  Administered 2020-10-16 – 2020-10-22 (×7): 500 mg via ORAL
  Filled 2020-10-16 (×9): qty 1

## 2020-10-16 MED ORDER — ATORVASTATIN CALCIUM 80 MG PO TABS
80.0000 mg | ORAL_TABLET | Freq: Every day | ORAL | Status: DC
  Administered 2020-10-17 – 2020-10-19 (×3): 80 mg via ORAL
  Filled 2020-10-16 (×3): qty 1

## 2020-10-16 MED ORDER — SODIUM BICARBONATE 650 MG PO TABS
650.0000 mg | ORAL_TABLET | Freq: Two times a day (BID) | ORAL | Status: DC
  Administered 2020-10-16 – 2020-10-21 (×11): 650 mg via ORAL
  Filled 2020-10-16 (×13): qty 1

## 2020-10-16 MED ORDER — FINASTERIDE 5 MG PO TABS
5.0000 mg | ORAL_TABLET | Freq: Every day | ORAL | Status: DC
  Administered 2020-10-17 – 2020-10-23 (×6): 5 mg via ORAL
  Filled 2020-10-16 (×6): qty 1

## 2020-10-16 MED ORDER — ONDANSETRON HCL 4 MG/2ML IJ SOLN: 4.0000 mg | Freq: Four times a day (QID) | INTRAMUSCULAR | Status: DC | PRN

## 2020-10-16 MED ORDER — ONDANSETRON HCL 4 MG PO TABS: 4.0000 mg | ORAL_TABLET | Freq: Four times a day (QID) | ORAL | Status: DC | PRN

## 2020-10-16 NOTE — ED Notes (Signed)
Son -- [place]-- notified-- spoke w daughter-in-law and son- states pt has no hx of confusion, pt still does not remember what happened

## 2020-10-16 NOTE — ED Notes (Signed)
To CT with monitor

## 2020-10-16 NOTE — ED Notes (Signed)
Attempted to collect a urine sample, patient asked to use the bathroom, this tech gave pt a urinal, pt reported he could not use the urinal, this tech took pt to bathroom in a wheelchair, pt had incontinence of urine in the bed and was unable to provide a sample.

## 2020-10-16 NOTE — H&P (Signed)
History and Physical    Cesar [name]:[id number] DOB: 1929/03/22 DOA: 10/16/2020  PCP: Dagoberto Ligas, PA-C (Confirm with patient/family/NH records and if not entered, this has to be entered at [facility] point of entry) Patient coming from: Home  I have personally briefly reviewed patient's old medical records in [place]  Chief Complaint: I passed out  HPI: Cesar [name] is a 87 y.o. male with medical history significant of moderate to severe aortic stenosis, chronic A. fib on Eliquis, seizure disorder (seems diagnosed in 2021, has been on Depakote since), CKD stage III, aortic aneurysm, OSA not on CPAP, HLD, presented with syncope.  Patient was somewhat confused, but he did not remember he passed out this morning while riding a tractor outside in his barn.  He denied any chest pain shortness of breath lightheadedness before this happened.  He has limited total episode lasted about few minutes, he woke up and found him on the ground and able to walk back to his house.  Recently, 2 to 3 weeks, also experienced significant decreased exercise tolerance, he used to be able to walk a few minutes before developed dyspnea due to his aortic stenosis problem likely, however recently he could barely walk for more than 20-30 steps before feeling shortness of breath and had to stop and take rest.  He denies any orthopnea.  No chest pain, no cough no fever chills. ED Course: Blood pressure significantly elevated, borderline bradycardia, CT head negative for acute findings or intracranial bleed.  Potassium 4.7  Review of Systems: As per HPI otherwise 14 point review of systems negative.   No past medical history on file.     has no history on file for tobacco use, alcohol use, and drug use.  Not on File  No family history on file.    Prior to Admission medications   Not on File    Physical Exam: Vitals:   10/16/20 1031 10/16/20 1033 10/16/20 1235 10/16/20 1406  BP: (S) (!) 152/78   (!) 147/70 (!) 162/76  Pulse: 70  70 65  Resp: (!) 30  18 18   Temp: 98.5 F (36.9 C)   98.5 F (36.9 C)  TempSrc: Temporal     SpO2: 100%  99% 99%  Weight:  86.2 kg    Height:  6\' 1"  (1.854 m)      Constitutional: NAD, calm, comfortable Vitals:   10/16/20 1031 10/16/20 1033 10/16/20 1235 10/16/20 1406  BP: (S) (!) 152/78  (!) 147/70 (!) 162/76  Pulse: 70  70 65  Resp: (!) 30  18 18   Temp: 98.5 F (36.9 C)   98.5 F (36.9 C)  TempSrc: Temporal     SpO2: 100%  99% 99%  Weight:  86.2 kg    Height:  6\' 1"  (1.854 m)     Eyes: PERRL, lids and conjunctivae normal ENMT: Mucous membranes are moist. Posterior pharynx clear of any exudate or lesions.Normal dentition.  Neck: normal, supple, no masses, no thyromegaly Respiratory: clear to auscultation bilaterally, no wheezing, no crackles. Normal respiratory effort. No accessory muscle use.  Cardiovascular: Irregular heart rate, diminished S1 and S2, loud systolic murmur heard base. No extremity edema. 2+ pedal pulses. No carotid bruits.  Abdomen: no tenderness, no masses palpated. No hepatosplenomegaly. Bowel sounds positive.  Musculoskeletal: no clubbing / cyanosis. No joint deformity upper and lower extremities. Good ROM, no contractures. Normal muscle tone.  Skin: no rashes, lesions, ulcers. No induration Neurologic: CN 2-12 grossly intact. Sensation intact,  DTR normal. Strength 5/5 in all 4.  Psychiatric: Normal judgment and insight. Alert and oriented x 3. Normal mood.     Labs on Admission: I have personally reviewed following labs and imaging studies  CBC: Recent Labs  Lab 10/16/20 1000  WBC 6.0  NEUTROABS 4.6  HGB 12.7*  HCT 39.8  MCV 99.7  PLT [id number]*   Basic Metabolic Panel: Recent Labs  Lab 10/16/20 1000  NA 139  K 4.7  CL 108  CO2 19*  GLUCOSE 108*  BUN 25*  CREATININE 1.46*  CALCIUM 9.1  MG 2.2   GFR: Estimated Creatinine Clearance: 38.8 mL/min (A) (by C-G formula based on SCr of 1.46 mg/dL  (H)). Liver Function Tests: Recent Labs  Lab 10/16/20 1000  AST 40  ALT 29  ALKPHOS 61  BILITOT 1.3*  PROT 6.6  ALBUMIN 3.4*   No results for input(s): LIPASE, AMYLASE in the last 168 hours. No results for input(s): AMMONIA in the last 168 hours. Coagulation Profile: No results for input(s): INR, PROTIME in the last 168 hours. Cardiac Enzymes: No results for input(s): CKTOTAL, CKMB, CKMBINDEX, TROPONINI in the last 168 hours. BNP (last 3 results) No results for input(s): PROBNP in the last 8760 hours. HbA1C: No results for input(s): HGBA1C in the last 72 hours. CBG: No results for input(s): GLUCAP in the last 168 hours. Lipid Profile: No results for input(s): CHOL, HDL, LDLCALC, TRIG, CHOLHDL, LDLDIRECT in the last 72 hours. Thyroid Function Tests: No results for input(s): TSH, T4TOTAL, FREET4, T3FREE, THYROIDAB in the last 72 hours. Anemia Panel: No results for input(s): VITAMINB12, FOLATE, FERRITIN, TIBC, IRON, RETICCTPCT in the last 72 hours. Urine analysis:    Component Value Date/Time   COLORURINE YELLOW 10/16/2020 1000   APPEARANCEUR CLEAR 10/16/2020 1000   LABSPEC 1.013 10/16/2020 1000   PHURINE 7.0 10/16/2020 1000   GLUCOSEU NEGATIVE 10/16/2020 1000   HGBUR NEGATIVE 10/16/2020 1000   BILIRUBINUR NEGATIVE 10/16/2020 1000   KETONESUR NEGATIVE 10/16/2020 1000   PROTEINUR NEGATIVE 10/16/2020 1000   NITRITE NEGATIVE 10/16/2020 1000   LEUKOCYTESUR TRACE (A) 10/16/2020 1000    Radiological Exams on Admission: DG Chest 2 View  Result Date: 10/16/2020 CLINICAL DATA:  Syncope. EXAM: CHEST - 2 VIEW COMPARISON:  August 07, 2019. FINDINGS: Stable cardiomegaly. No pneumothorax is noted. Mild bibasilar atelectasis is noted with small pleural effusions. Bony thorax is unremarkable. IMPRESSION: Mild bibasilar atelectasis with small pleural effusions. Electronically Signed   By: Marijo Conception M.D.   On: 10/16/2020 14:28   CT Head Wo Contrast  Result Date:  10/16/2020 CLINICAL DATA:  Altered mental status after fall from golf cart. EXAM: CT HEAD WITHOUT CONTRAST TECHNIQUE: Contiguous axial images were obtained from the base of the skull through the vertex without intravenous contrast. COMPARISON:  March 15, 2019. FINDINGS: Brain: Mild diffuse cortical atrophy is noted. Mild chronic ischemic white matter disease is noted. 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 No mass effect or midline shift is noted. Ventricular size is within normal limits. There is no evidence of mass lesion, hemorrhage or acute infarction. Vascular: No hyperdense vessel or unexpected calcification. Skull: Normal. Negative for fracture or focal lesion. Sinuses/Orbits: No acute finding. Other: None. IMPRESSION: Mild diffuse cortical atrophy. Mild chronic ischemic white matter disease. 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 No acute intracranial abnormality seen. Electronically Signed   By: Marijo Conception M.D.   On: 10/16/2020 10:26    EKG: Independently reviewed.  Rate controlled A. fib  Assessment/Plan  Active Problems:   Syncope  (please populate well all problems here in Problem List. (For example, if patient is on BP meds at home and you resume or decide to hold them, it is a problem that needs to be her. Same for CAD, COPD, HLD and so on)  Syncope -Suspect this is related to severe aortic stenosis which probably deteriorated over the last few month, as patient had experienced symptoms of CHF, no syncope.  Discussed with patient's daughter Hazle Quant over phone [phone]), who reported that the patient was seen by cardiology recently and recommended routine echocardiogram.  And cardiology in the past has discussed with patient and family regarding option of TAVR. -Ordered echocardiogram, consult cardiology -Arrhythmia cannot be completely ruled out at this time.  Telemetry monitoring, hold off beta-blocker for now given borderline bradycardia. -Overall prognosis poor, given the new  onset symptoms of CHF and no syncope, estimated life expectancy less than a year. If TAVR not any option will need to consider hospice/palliative. -Orthostatic vital signs and PT evaluation  Uncontrolled hypertension -Euvolemic, baseline blood pressure systolic 098J, will not start beta-blocker given borderline bradycardia, instead will do as needed hydralazine for now.  Seizure -There was report of urinary incontinence however patient could not confirm. And the confusion likely can be explained by concussion sustained during syncope.  We will monitor while continuing his home seizure medications. -Looks like neurology has recently started patient on Depakote, will check Depakote level.  Chronic A. Fib -Sinyard is negative, however patient still somewhat confused, will hold off systemic anticoagulation for today, repeat CBC and reevaluate his mentation tomorrow, and then restart anticoagulation.  CKD stage III with non-anion gap metabolic acidosis -Euvolemic -Start bicarb supplement   DVT prophylaxis: Heparin subcu, consider switch back to Eliquis once mentation stabilized and CBC remains stable.   Code Status: DNR (daughter in law to bring in copy of living will) Family Communication: Daughter over the phone Disposition Plan: Expect more than 2 midnight hospital stay for cardiac work-up and PT evaluation.   Consults called: Cardiology Admission status: Telemetry admission   Lequita Halt MD [facility] Pager [phone]  10/16/2020, 3:32 PM

## 2020-10-16 NOTE — Progress Notes (Signed)
Orthopedic Tech Progress Note Patient Details:  Cesar [name] [birth year] [id number] Level 2 Trauma. Not needed Patient ID: PARISH DUBOSE, male   DOB: October 16, 1928, 87 y.o.   MRN: [id number]   Chip Boer 10/16/2020, 10:01 AM

## 2020-10-16 NOTE — Progress Notes (Signed)
As per cardiology recommendation, as pt has multiple strokes in the past and risk of recurrent stroke considered to be high, will restart Eliquis tonight.

## 2020-10-16 NOTE — ED Provider Notes (Signed)
[facility] EMERGENCY DEPARTMENT Provider Note   CSN: [id number] Arrival date & time: 10/16/20  [id number]     History Chief Complaint  Patient presents with   Level 2 fall on thinners    Cesar [name] is a 87 y.o. male.  Patient brought in by EMS from home.  Patient had an unwitnessed fall or syncope or seizure.  Patient had headed out to his workshop where his tractor is.  In the morning his wife was on the phone she usually disorder tries to keep track of him because he likes to climb into the tractor and he is getting up in years and sometimes he slips.  Patient apparently had a fall he had evidence of trauma to the right side of his temple area in his right ear.  Patient did make his way back to the house and was confused and the wife called EMS.  Patient has no memory of the events.  He also had incontinence raising the concern for possible seizure does not have a history of seizures though but is on a seizure med to help with some behavioral problems according to his family.  His long-term memory was good.  Denied any other injuries.  Patient did state that his tetanus is up-to-date.  Chart review shows no evidence of any records.  However patient has a relatively new abdominal scar that is well-healed.  And he states that it was done here.  So some concerns have been raised about him being registered under the wrong chart or name.        No past medical history on file.  Patient Active Problem List   Diagnosis Date Noted   Syncope 10/16/2020   Fall         Family History  Problem Relation Age of Onset   Heart failure Mother    Diabetes Father    Stroke Father        Home Medications Prior to Admission medications   Medication Sig Start Date End Date Taking? Authorizing Provider  apixaban (ELIQUIS) 5 MG TABS tablet Take 5 mg by mouth 2 (two) times daily.   Yes [provider]  atorvastatin (LIPITOR) 80 MG tablet Take 80 mg by mouth  daily.   Yes [provider]  divalproex (DEPAKOTE ER) 500 MG 24 hr tablet Take 500 mg by mouth at bedtime. 10/05/20  Yes [provider]  finasteride (PROSCAR) 5 MG tablet Take 5 mg by mouth daily.   Yes [provider]    Allergies    Patient has no known allergies.  Review of Systems   Review of Systems  Constitutional: Negative for chills and fever.  HENT: Negative for rhinorrhea and sore throat.   Eyes: Negative for visual disturbance.  Respiratory: Negative for cough and shortness of breath.   Cardiovascular: Negative for chest pain and leg swelling.  Gastrointestinal: Negative for abdominal pain, diarrhea, nausea and vomiting.  Genitourinary: Negative for dysuria.  Musculoskeletal: Negative for back pain and neck pain.  Skin: Negative for rash.  Neurological: Negative for dizziness, light-headedness and headaches.  Hematological: Does not bruise/bleed easily.  Psychiatric/Behavioral: Negative for confusion.    Physical Exam Updated Vital Signs BP (!) 162/76    Pulse 65    Temp 98.5 F (36.9 C)    Resp 18    Ht 1.854 m (6\' 1" )    Wt 86.2 kg    SpO2 99%    BMI 25.07 kg/m   Physical  Exam Vitals and nursing note reviewed.  Constitutional:      General: He is not in acute distress.    Appearance: He is well-developed and well-nourished.  HENT:     Head: Normocephalic and atraumatic.     Comments: Patient with kind of a crush punctate injuries to the pinna.  And the right temporal area has some abrasions. Eyes:     Extraocular Movements: Extraocular movements intact.     Conjunctiva/sclera: Conjunctivae normal.     Pupils: Pupils are equal, round, and reactive to light.  Cardiovascular:     Rate and Rhythm: Normal rate and regular rhythm.     Heart sounds: No murmur heard.   Pulmonary:     Effort: Pulmonary effort is normal. No respiratory distress.     Breath sounds: Normal breath sounds.  Abdominal:     Palpations: Abdomen is soft.      Tenderness: There is no abdominal tenderness.  Musculoskeletal:        General: No edema. Normal range of motion.     Cervical back: Neck supple.     Comments: Patient also with an abrasion to his right ankle area.  He has some bruising on top of his feet which he states is old from chopping wood.  The wood hits it  Skin:    General: Skin is warm and dry.     Capillary Refill: Capillary refill takes less than 2 seconds.  Neurological:     General: No focal deficit present.     Mental Status: He is alert and oriented to person, place, and time.     Cranial Nerves: No cranial nerve deficit.     Sensory: No sensory deficit.     Motor: No weakness.  Psychiatric:        Mood and Affect: Mood and affect normal.     ED Results / Procedures / Treatments   Labs (all labs ordered are listed, but only abnormal results are displayed) Labs Reviewed  CBC WITH DIFFERENTIAL/PLATELET - Abnormal; Notable for the following components:      Result Value   RBC 3.99 (*)    Hemoglobin 12.7 (*)    Platelets 139 (*)    Lymphs Abs 0.6 (*)    All other components within normal limits  COMPREHENSIVE METABOLIC PANEL - Abnormal; Notable for the following components:   CO2 19 (*)    Glucose, Bld 108 (*)    BUN 25 (*)    Creatinine, Ser 1.46 (*)    Albumin 3.4 (*)    Total Bilirubin 1.3 (*)    GFR, Estimated 46 (*)    All other components within normal limits  URINALYSIS, ROUTINE W REFLEX MICROSCOPIC - Abnormal; Notable for the following components:   Leukocytes,Ua TRACE (*)    All other components within normal limits  RESP PANEL BY RT-PCR (RSV, FLU A&B, COVID)  RVPGX2  MAGNESIUM  VALPROIC ACID LEVEL  TSH  VITAMIN B12  RPR  BLOOD GAS, VENOUS    EKG None  Radiology DG Chest 2 View  Result Date: 10/16/2020 CLINICAL DATA:  Syncope. EXAM: CHEST - 2 VIEW COMPARISON:  August 07, 2019. FINDINGS: Stable cardiomegaly. No pneumothorax is noted. Mild bibasilar atelectasis is noted with small pleural  effusions. Bony thorax is unremarkable. IMPRESSION: Mild bibasilar atelectasis with small pleural effusions. Electronically Signed   By: Marijo Conception M.D.   On: 10/16/2020 14:28   CT Head Wo Contrast  Result Date: 10/16/2020 CLINICAL DATA:  Altered  mental status after fall from golf cart. EXAM: CT HEAD WITHOUT CONTRAST TECHNIQUE: Contiguous axial images were obtained from the base of the skull through the vertex without intravenous contrast. COMPARISON:  March 15, 2019. FINDINGS: Brain: Mild diffuse cortical atrophy is noted. Mild chronic ischemic white matter disease is noted. 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 No mass effect or midline shift is noted. Ventricular size is within normal limits. There is no evidence of mass lesion, hemorrhage or acute infarction. Vascular: No hyperdense vessel or unexpected calcification. Skull: Normal. Negative for fracture or focal lesion. Sinuses/Orbits: No acute finding. Other: None. IMPRESSION: Mild diffuse cortical atrophy. Mild chronic ischemic white matter disease. 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 No acute intracranial abnormality seen. Electronically Signed   By: Marijo Conception M.D.   On: 10/16/2020 10:26    Procedures Procedures (including critical care time)  Medications Ordered in ED Medications  sodium chloride flush (NS) 0.9 % injection 3 mL (has no administration in time range)  heparin injection 5,000 Units (has no administration in time range)  hydrALAZINE (APRESOLINE) tablet 25 mg (has no administration in time range)  acetaminophen (TYLENOL) tablet 650 mg (has no administration in time range)    Or  acetaminophen (TYLENOL) suppository 650 mg (has no administration in time range)  ondansetron (ZOFRAN) tablet 4 mg (has no administration in time range)    Or  ondansetron (ZOFRAN) injection 4 mg (has no administration in time range)  sodium bicarbonate tablet 650 mg (has no administration in time range)  atorvastatin (LIPITOR) tablet  80 mg (has no administration in time range)  finasteride (PROSCAR) tablet 5 mg (has no administration in time range)  divalproex (DEPAKOTE ER) 24 hr tablet 500 mg (has no administration in time range)    ED Course  I have reviewed the triage vital signs and the nursing notes.  Pertinent labs & imaging results that were available during my care of the patient were reviewed by me and considered in my medical decision making (see chart for details).    MDM Rules/Calculators/A&P                          Not clear exactly what occurred.  There clearly was a fall at some point where he hit his head.  Head CT shows no acute changes.  There is some evidence of chronic disease and an old left frontal lobe infarct.  Labs without significant abnormalities.  EKG and cardiac monitoring without any significant arrhythmias.  Talk with family.  Nobody has witnessed this.  She was not clear whether he had a fall and hit his head he did have some incontinence.  Does not have a history of seizures but seizure is a possibility and also syncope is a possibility.  Not able to sort this out patient still has no memory.  Also could have a mild concussion.  Hospitalist have agreed to admit and monitor overnight with cardiac monitoring.     Final Clinical Impression(s) / ED Diagnoses Final diagnoses:  Syncope, unspecified syncope type    Rx / DC Orders ED Discharge Orders    None       Fredia Sorrow, MD 10/16/20 1654

## 2020-10-16 NOTE — Consult Note (Addendum)
Cardiology Consultation:   Patient ID: Cesar [name] MRN: [id number]; DOB: 1928-10-10  Admit date: 10/16/2020 Date of Consult: 10/16/2020  Primary Care Provider: Dagoberto Ligas, PA-C [facility] Cardiologist: [name], MD  [facility] Electrophysiologist:  None    Patient Profile:   Cesar [name] is a 87 y.o. 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 [name], obstructive sleep apnea not on CPAP, pulmonary nodules, hyperlipidemia, CKD stage III, and seizure disorder who is being seen today for the evaluation of syncope at the request of Dr. Roosevelt Locks.  History of Present Illness:   [name] is a 87 year old male with the above history who is followed by Dr. [name].   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 Remote nuclear stress test in 2003 was suggestive of lateral wall ischemia; however, no further ischemic work-up was pursued at the time due to lack of associated symptoms. Echo in 07/2019 showed LVEF of 60-65% with moderate LVH, severe biatrial enlargement, mild mitral regurgitation, and moderate aortic stenosis with mean gradient of 33 mmHg.   Patient also has known ascending aortic aneurysm which is followed by Dr. [name]. Last chest CT in 11/2018 showed stable tubular thoracic aorta measuring 4.5 x 4.4 cm (stable on examination dating back to 04/2015).   Patient was recently seen by Roby Lofts, PA-C, on  10/02/2020 at which time he was doing well from a cardiac standpoint. His wife reported occasional dyspnea on exertion but stated it did not occur often and was not lifestyle limiting. Overall, doing well from a cardiac standpoint. Echo was ordered for serial monitoring of aortic stenosis.   Patient reported to the ED today for further evaluation of syncope. Patient has been in his usual state of health. He reports he was working in his garage on his tractor today and then got in a golf cart to ride back up to his house. The next then he remember he was waking up on the ground in his house with his wife standing over him. He does not remember getting out of the golf cart or walking into the house. He denies any prodromal symptoms including chest pain, shortness of breath, palpitations, lightheadedness, dizziness. He is not sure how long he was unconscious. He must have hit is head because he does have dried blood on his right ear. Patient denies any bowel/bladder incontinence, tongue biting, or seizure like activity.    Called and spoke with his wife. She states she did not find patient unconscious in the house. Patient reportedly walking up to the house from the golf cart, rang the doorbell, and then sat in the recliner. Patient then told wife that he had fallen. Wife asked if he hit his head and he said he thought so and thought he passed out. Wife noticed that right ear was bleeding and blue. Wife denies witnessing any seizure like activity. She does report that it seems like his dyspnea on exertion has worsened lately.   In the ED, patient hypertensive but otherwise vitals stable. EKG  has not been done. Head CT showed no acute intracranial abnormality. Chest x-ray showed mild bibasilar atelectasis with small pleural effusions. WBC 6.0, Hgb 12.7, Plts 139. Na 139, K 4.7, Mg 2.2, Glucose 108, BUN 25, Cr 1.46. Albumin 3.4, AST 40, ALT 29, Alk Phos 61, Total Bili 1.3. Urinalysis showed trace leukocytes but  otherwise unremarkable. Respiratory panel negative for COVID and influenza A/B. Patient admitted under Internal Medicine Service and Cardiology consulted for further evaluation.   At the time of this evaluation, patient is resting comfortably in no acute distress. He is currently asymptomatic.   History reviewed. No pertinent past medical history.  History reviewed. No pertinent surgical history.   Home Medications:  Prior to Admission medications   Medication Sig Start Date End Date Taking? Authorizing Provider  apixaban (ELIQUIS) 5 MG TABS tablet Take 5 mg by mouth 2 (two) times daily.   Yes [provider]  atorvastatin (LIPITOR) 80 MG tablet Take 80 mg by mouth daily.   Yes [provider]  divalproex (DEPAKOTE ER) 500 MG 24 hr tablet Take 500 mg by mouth at bedtime. 10/05/20  Yes [provider]  finasteride (PROSCAR) 5 MG tablet Take 5 mg by mouth daily.   Yes [provider]    Inpatient Medications: Scheduled Meds:  apixaban  5 mg Oral BID   [START ON 10/17/2020] atorvastatin  80 mg Oral Daily   divalproex  500 mg Oral QHS   [START ON 10/17/2020] finasteride  5 mg Oral Daily   sodium bicarbonate  650 mg Oral BID   sodium chloride flush  3 mL Intravenous Q12H   Continuous Infusions:  PRN Meds: acetaminophen **OR** acetaminophen, hydrALAZINE, ondansetron **OR** ondansetron (ZOFRAN) IV  Allergies:   No Known Allergies  Social History:   Social History   Socioeconomic History   Marital status: Married    Spouse name: Not on file   Number of children: Not on file   Years of education: Not on file   Highest education level: Not on file  Occupational History   Not on file  Tobacco Use   Smoking status: Not on file   Smokeless tobacco: Not on file  Substance and Sexual Activity   Alcohol use: Not on file   Drug use: Not on file   Sexual activity: Not on file  Other Topics Concern   Not on file  Social History  Narrative   Not on file   Social Determinants of Health   Financial Resource Strain: Not on file  Food Insecurity: Not on file  Transportation Needs: Not on file  Physical Activity: Not on file  Stress: Not on file  Social Connections: Not on file  Intimate Partner Violence: Not on file    Family History:    Family History  Problem Relation Age of Onset   Heart failure Mother    Diabetes Father    Stroke Father      ROS:  Please see the history of present illness.  All other ROS reviewed and negative.     Physical Exam/Data:   Vitals:   10/16/20 1033 10/16/20 1235 10/16/20 1406 10/16/20 1705  BP:  (!) 147/70 (!) 162/76 (!) 183/82  Pulse:  70 65 77  Resp:  _0 Temp:   98.5 F (36.9 C)   TempSrc:      SpO2:  99% 99% 98%  Weight: 86.2 kg     Height: _1  (1.854 m)  No intake or output data in the 24 hours ending 10/16/20 1722 Last 3 Weights 10/16/2020  Weight (lbs) 190 lb  Weight (kg) 86.183 kg     Body mass index is 25.07 kg/m.  General: 87 y.o. male resting comfortably in no acute distress.  HEENT: Normocephalic and atraumatic. Sclera clear. Dried blood on right ear. Neck: Supple. No carotid bruits. No JVD. Heart: Irregularly irregular rhythm with normal rate. Distinct S1 and S2. III/VI systolic murmur. No gallops or rubs. Radial pulses 2+ and equal bilaterally. Lungs: No increased work of breathing. Clear to ausculation bilaterally. No wheezes, rhonchi, or rales.  Abdomen: Soft, non-distended, and non-tender to palpation. Bowel sounds present. Extremities: Trace lower extremity edema (worse around ankles).    Skin: Warm and dry. Neuro: Alert and oriented x3. No focal deficits. Psych: Normal affect. Responds appropriately.  EKG:  The EKG was personally reviewed and demonstrates:  EKG has note been done yet.   Telemetry:  Telemetry was personally reviewed and demonstrates:  Patient is not on telemetry.  Relevant CV Studies: Echocardiogram  07/31/2019: Impressions: 1. Left ventricular ejection fraction, by visual estimation, is 60 to  65%. The left ventricle has normal function. Normal left ventricular size.  There is moderately increased left ventricular hypertrophy.  2. Elevated mean left atrial pressure.  3. Global right ventricle has normal systolic function.The right  ventricular size is mildly enlarged.  4. Left atrial size was severely dilated.  5. Right atrial size was severely dilated.  6. Moderate mitral annular calcification.  7. The mitral valve is normal in structure. Mild mitral valve  regurgitation. No evidence of mitral stenosis.  8. The tricuspid valve is normal in structure. Tricuspid valve  regurgitation is mild.  9. The aortic valve is tricuspid Aortic valve regurgitation is trivial by  color flow Doppler. Moderate aortic valve stenosis.  10. The pulmonic valve was normal in structure. Pulmonic valve  regurgitation is mild by color flow Doppler.  11. Aortic dilatation noted.  12. There is mild dilatation of the aortic root measuring 39 mm.  13. Normal LV systolic function; moderate LVH; mildly dilated aortic root;  severe biatrial enlargement; mild RVE; moderate AS (mean gradient 33 mmHg)  and trace AI; mild MR.   Laboratory Data:  High Sensitivity Troponin:  No results for input(s): TROPONINIHS in the last 720 hours.   Chemistry Recent Labs  Lab 10/16/20 1000  NA 139  K 4.7  CL 108  CO2 19*  GLUCOSE 108*  BUN 25*  CREATININE 1.46*  CALCIUM 9.1  GFRNONAA 46*  ANIONGAP 12    Recent Labs  Lab 10/16/20 1000  PROT 6.6  ALBUMIN 3.4*  AST 40  ALT 29  ALKPHOS 61  BILITOT 1.3*   Hematology Recent Labs  Lab 10/16/20 1000  WBC 6.0  RBC 3.99*  HGB 12.7*  HCT 39.8  MCV 99.7  MCH 31.8  MCHC 31.9  RDW 13.4  PLT 139*   BNPNo results for input(s): BNP, PROBNP in the last 168 hours.  DDimer No results for input(s): DDIMER in the last 168 hours.   Radiology/Studies:  DG  Chest 2 View  Result Date: 10/16/2020 CLINICAL DATA:  Syncope. EXAM: CHEST - 2 VIEW COMPARISON:  August 07, 2019. FINDINGS: Stable cardiomegaly. No pneumothorax is noted. Mild bibasilar atelectasis is noted with small pleural effusions. Bony thorax is unremarkable. IMPRESSION: Mild bibasilar atelectasis with small pleural effusions. Electronically Signed   By: Marijo Conception M.D.   On: 10/16/2020 14:28  CT Head Wo Contrast  Result Date: 10/16/2020 CLINICAL DATA:  Altered mental status after fall from golf cart. EXAM: CT HEAD WITHOUT CONTRAST TECHNIQUE: Contiguous axial images were obtained from the base of the skull through the vertex without intravenous contrast. COMPARISON:  March 15, 2019. FINDINGS: Brain: Mild diffuse cortical atrophy is noted. Mild chronic ischemic white matter disease is noted. 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 No mass effect or midline shift is noted. Ventricular size is within normal limits. There is no evidence of mass lesion, hemorrhage or acute infarction. Vascular: No hyperdense vessel or unexpected calcification. Skull: Normal. Negative for fracture or focal lesion. Sinuses/Orbits: No acute finding. Other: None. IMPRESSION: Mild diffuse cortical atrophy. Mild chronic ischemic white matter disease. 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 No acute intracranial abnormality seen. Electronically Signed   By: Marijo Conception M.D.   On: 10/16/2020 10:26     Assessment and Plan:   Fall/Syncope - Patient presents after fall and presumed syncopal episodes. Patient cannot provide many details but denies any prodromal symptoms. No one witnessed this event.  - Head CT showed no acute findings. - Echo has been ordered. - EKG has not been done yet and patient is not on telemetry. Will check EKG and place patient on telemetry to monitor for any concerning arrhythmias.  - Patient does have seizure disorder. However, no seizure like activity, bowel/bladder incontinence, or tongue  biting.  - He does have a history of recurrent VTE events. He is not hypoxic, tachycardic, or tachypneic which would back me think he had a large PE. However, we can assess RV function on Echo.  Moderate Aortic Stenosis  - Echo in 07/2019 showed normal LV function with moderate AS (mean gradient 33 mmHg).  - Updated Echo pending.  Permanent Atrial Fibrillation - History of permanent atrial fibrillation with multiple embolic events in the past (see HPI for details).  - Currently rate controlled. - Not on any rate controlled medications at home. - On Eliquis 39m twice daily at home. Eliquis temporary on hold for today given patient's confusion with plans to restart tomorrow after evaluation of mentation. However, patient has had multiple VTE events in the setting of interruption of anticoagulation. Would limit interruption of Eliquis.  CHA2DS2-VASc Score = 5  This indicates a 7.2% annual risk of stroke. The patient's score is based upon: CHF History: No HTN History: No Diabetes History: No Stroke History: Yes Vascular Disease History: Yes Age Score: 2 Gender Score: 0  Ascending Aortic Aneurysm - Most recent chest CT in 11/2018 showed stable stable tubular thoracic aorta measuring 4.5 x 4.4 cm (stable on examination dating back to 04/2015).  - Followed by Dr. BCyndia Bent  Elevated BP without Diagnosis of Hypertension - No history of hypertension but BP quite elevated in the ED as high as 183/82. - Discussed with MD - will hold off on adding any medications at this time and continue to monitor.   Hyperlipidemia - Continue Lipitor 857mdaily.  CKD Stage III - Creatinine 1.46 on admission. Baseline around 1.3 to 1.5. - Continue to monitor closely.   For questions or updates, please contact [facility] consult [email or web address] for contact info under    Signed, CaEppie Gibson1/09/2021 5:22 PM   Attending Note:   The patient was seen and examined.  Agree with  assessment and plan as noted above.  Changes made to the above note as needed.  Patient seen and independently examined with CaSande Rives  PA .   We discussed all aspects of the encounter. I agree with the assessment and plan as stated above.  1.  Syncope:   Etiology is unknown at this time Agree with repeating echo .   He has moderate AS.   If the AS has become severe, this may have contributed to his syncope.  If its still moderate, I doubt it would have contributed  Will place on tele and watch for any significang arrhythmias   2.  Atrial fib:   Has had multiple embolic events at times when he needed to hold his anticoagulation. The head CT from today shows no intra cranial bleed . Has some superficial ear trauma. I think he can continue the eliquis as scheduled.   3.  Asc. Aortic aneurism.  4.5 x 4.4.  follwed by Dr. [name].    I have spent a total of 40 minutes with patient reviewing hospital  notes , telemetry, EKGs, labs and examining patient as well as establishing an assessment and plan that was discussed with the patient. > 50% of time was spent in direct patient care.   Thayer Headings, Brooke Bonito., MD, [facility] 10/16/2020, 5:33 PM [address]  [place] Pager [phone]

## 2020-10-16 NOTE — Progress Notes (Signed)
Responded to level 2 to support patient and staff. Patient is alert and talking with staff. Currently no chaplain support needed. Will follow as  Needed.  Jaclynn Major, [place], [facility], Pager [phone]

## 2020-10-16 NOTE — ED Notes (Signed)
Patient transported to X-ray 

## 2020-10-17 ENCOUNTER — Inpatient Hospital Stay (HOSPITAL_COMMUNITY): Payer: Medicare Other

## 2020-10-17 DIAGNOSIS — I351 Nonrheumatic aortic (valve) insufficiency: Secondary | ICD-10-CM

## 2020-10-17 DIAGNOSIS — I350 Nonrheumatic aortic (valve) stenosis: Secondary | ICD-10-CM

## 2020-10-17 DIAGNOSIS — R55 Syncope and collapse: Secondary | ICD-10-CM | POA: Diagnosis not present

## 2020-10-17 DIAGNOSIS — I4811 Longstanding persistent atrial fibrillation: Secondary | ICD-10-CM | POA: Diagnosis not present

## 2020-10-17 LAB — CBC
HCT: 37 % — ABNORMAL LOW (ref 39.0–52.0)
Hemoglobin: 11.7 g/dL — ABNORMAL LOW (ref 13.0–17.0)
MCH: 31.4 pg (ref 26.0–34.0)
MCHC: 31.6 g/dL (ref 30.0–36.0)
MCV: 99.2 fL (ref 80.0–100.0)
Platelets: 121 10*3/uL — ABNORMAL LOW (ref 150–400)
RBC: 3.73 MIL/uL — ABNORMAL LOW (ref 4.22–5.81)
RDW: 13.4 % (ref 11.5–15.5)
WBC: 6 10*3/uL (ref 4.0–10.5)
nRBC: 0 % (ref 0.0–0.2)

## 2020-10-17 LAB — BASIC METABOLIC PANEL
Anion gap: 11 (ref 5–15)
BUN: 22 mg/dL (ref 8–23)
CO2: 22 mmol/L (ref 22–32)
Calcium: 8.6 mg/dL — ABNORMAL LOW (ref 8.9–10.3)
Chloride: 106 mmol/L (ref 98–111)
Creatinine, Ser: 1.43 mg/dL — ABNORMAL HIGH (ref 0.61–1.24)
GFR, Estimated: 47 mL/min — ABNORMAL LOW (ref >60)
Glucose, Bld: 92 mg/dL (ref 70–99)
Potassium: 4.2 mmol/L (ref 3.5–5.1)
Sodium: 139 mmol/L (ref 135–145)

## 2020-10-17 LAB — ECHOCARDIOGRAM COMPLETE
AR max vel: 0.44 cm2
AV Area VTI: 0.43 cm2
AV Area mean vel: 0.44 cm2
AV Mean grad: 36.7 mmHg
AV Peak grad: 61.7 mmHg
Ao pk vel: 3.93 m/s
Height: 73 in
MV M vel: 5.5 m/s
MV Peak grad: 121 mmHg
P 1/2 time: 359 msec
S' Lateral: 3.6 cm
Weight: 3040.58 oz

## 2020-10-17 LAB — GLUCOSE, CAPILLARY: Glucose-Capillary: 111 mg/dL — ABNORMAL HIGH (ref 70–99)

## 2020-10-17 LAB — RPR: RPR Ser Ql: NONREACTIVE

## 2020-10-17 NOTE — Progress Notes (Signed)
Progress Note  Patient Name: Cesar [name] Date of Encounter: 10/17/2020  [facility] Cardiologist: [name], MD   Subjective    87 yo with hx of moderate AS, atrial fib , HLD ,  Admitted after a syncopal episode Tele shows no significant arrhythmia Echo is scheduled for today  I talked with his daughter, Idell Pickles ( at [facility])   Inpatient Medications    Scheduled Meds: . apixaban  5 mg Oral BID  . atorvastatin  80 mg Oral Daily  . divalproex  500 mg Oral QHS  . finasteride  5 mg Oral Daily  . sodium bicarbonate  650 mg Oral BID  . sodium chloride flush  3 mL Intravenous Q12H   Continuous Infusions:  PRN Meds: acetaminophen **OR** acetaminophen, hydrALAZINE, ondansetron **OR** ondansetron (ZOFRAN) IV   Vital Signs    Vitals:   10/17/20 0424 10/17/20 0837 10/17/20 0839 10/17/20 0841  BP: 130/75 133/71 125/63 135/63  Pulse: 66 67 67 (!) 51  Resp: 18 16 18 16   Temp: 98.3 F (36.8 C) 97.8 F (36.6 C)    TempSrc: Oral Oral    SpO2: 95% 97% 99% 97%  Weight: 86.2 kg     Height:        Intake/Output Summary (Last 24 hours) at 10/17/2020 0957 Last data filed at 10/17/2020 2229 Gross per 24 hour  Intake 480 ml  Output --  Net 480 ml   Last 3 Weights 10/17/2020 10/16/2020  Weight (lbs) 190 lb 0.6 oz 190 lb  Weight (kg) 86.2 kg 86.183 kg      Telemetry    Atrial fib with controlled V response - Personally Reviewed  ECG     - Personally Reviewed  Physical Exam    GEN:  elderly, frail man, Neck: No JVD Cardiac:  Irreg irreg. ,  2/6 systolic murmur  Respiratory: Clear to auscultation bilaterally. GI: Soft, nontender, non-distended  MS: No edema; No deformity. Neuro:  Nonfocal  Psych: Normal affect   Labs    High Sensitivity Troponin:  No results for input(s): TROPONINIHS in the last 720 hours.    Chemistry Recent Labs  Lab 10/16/20 1000 10/17/20 0304  NA 139 139  K 4.7 4.2  CL 108 106  CO2 19* 22  GLUCOSE 108* 92  BUN 25* 22   CREATININE 1.46* 1.43*  CALCIUM 9.1 8.6*  PROT 6.6  --   ALBUMIN 3.4*  --   AST 40  --   ALT 29  --   ALKPHOS 61  --   BILITOT 1.3*  --   GFRNONAA 46* 47*  ANIONGAP 12 11     Hematology Recent Labs  Lab 10/16/20 1000 10/17/20 0304  WBC 6.0 6.0  RBC 3.99* 3.73*  HGB 12.7* 11.7*  HCT 39.8 37.0*  MCV 99.7 99.2  MCH 31.8 31.4  MCHC 31.9 31.6  RDW 13.4 13.4  PLT 139* 121*    BNPNo results for input(s): BNP, PROBNP in the last 168 hours.   DDimer No results for input(s): DDIMER in the last 168 hours.   Radiology    DG Chest 2 View  Result Date: 10/16/2020 CLINICAL DATA:  Syncope. EXAM: CHEST - 2 VIEW COMPARISON:  August 07, 2019. FINDINGS: Stable cardiomegaly. No pneumothorax is noted. Mild bibasilar atelectasis is noted with small pleural effusions. Bony thorax is unremarkable. IMPRESSION: Mild bibasilar atelectasis with small pleural effusions. Electronically Signed   By: Marijo Conception M.D.   On: 10/16/2020 14:28  CT Head Wo Contrast  Result Date: 10/16/2020 CLINICAL DATA:  Altered mental status after fall from golf cart. EXAM: CT HEAD WITHOUT CONTRAST TECHNIQUE: Contiguous axial images were obtained from the base of the skull through the vertex without intravenous contrast. COMPARISON:  March 15, 2019. FINDINGS: Brain: Mild diffuse cortical atrophy is noted. Mild chronic ischemic white matter disease is noted. 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 No mass effect or midline shift is noted. Ventricular size is within normal limits. There is no evidence of mass lesion, hemorrhage or acute infarction. Vascular: No hyperdense vessel or unexpected calcification. Skull: Normal. Negative for fracture or focal lesion. Sinuses/Orbits: No acute finding. Other: None. IMPRESSION: Mild diffuse cortical atrophy. Mild chronic ischemic white matter disease. 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 No acute intracranial abnormality seen. Electronically Signed   By: Marijo Conception M.D.   On:  10/16/2020 10:26    Cardiac Studies      Patient Profile     87 y.o. male with moderte AS, admitted with syncopal episode   Assessment & Plan    1.  Syncope:   No significant arrhythmias to explain his episode of syncope. He has known AS. Repeat echo today  If his AS is now severe, he may be a candidate for TAVR. Would defer to Dr. [name] for further discussion of this   Echo today .     DC likely tomorrow   2. Atrial fib:  Rate is controlled. On eliquis  3.  Hyperlipidemia :   Stable        For questions or updates, please contact [place] Please consult [email or web address] for contact info under        Signed, Mertie Moores, MD  10/17/2020, 9:57 AM

## 2020-10-17 NOTE — Plan of Care (Signed)
  Problem: Education: Goal: Knowledge of General Education information will improve Description Including pain rating scale, medication(s)/side effects and non-pharmacologic comfort measures Outcome: Progressing   Problem: Health Behavior/Discharge Planning: Goal: Ability to manage health-related needs will improve Outcome: Progressing   

## 2020-10-17 NOTE — Plan of Care (Signed)

## 2020-10-17 NOTE — Progress Notes (Signed)
PROGRESS NOTE    Cesar [name]  [id number] DOB: October 12, 1928 DOA: 10/16/2020 PCP: No primary care provider on file.  Brief Narrative:  Cesar [name] is a 87 y.o. male with medical history significant of moderate to severe aortic stenosis, chronic A. fib on Eliquis, seizure disorder (seems diagnosed in 2021, has been on Depakote since), CKD stage III, aortic aneurysm, OSA not on CPAP, HLD, presented with syncope. Cardiology consulted with ongoing evaluation for possible AS.    Assessment & Plan:   Active Problems:   Syncope   Syncope -Suspect this is related to severe aortic stenosis which probably deteriorated over the last few month, as patient had experienced symptoms of CHF, no syncope.  Discussed with patient's daughter Cesar [name] over phone [phone]), who reported that the patient was seen by cardiology recently and recommended routine echocardiogram.  And cardiology in the past has discussed with patient and family regarding option of TAVR. -Ordered echocardiogram, consult cardiology -Arrhythmia cannot be completely ruled out at this time.  Telemetry monitoring, hold off beta-blocker for now given borderline bradycardia. -Overall prognosis poor, given the new onset symptoms of CHF and no syncope, estimated life expectancy less than a year. If TAVR not any option will need to consider hospice/palliative. -Orthostatic vital signs and PT evaluation 1/13 Appreciate Cardiology seeing him. 2D echo has been ordered. Will continue monitoring him for now. Continue fall precautions.   Uncontrolled hypertension -Euvolemic, baseline blood pressure systolic [id number], will not start beta-blocker given borderline bradycardia, instead will do as needed hydralazine for now. 1/13 BP improving today. Will continue current regimen.   Seizure -There was report of urinary incontinence however patient could not confirm. And the confusion likely can be explained by concussion sustained during syncope.   We will monitor while continuing his home seizure medications. -Looks like neurology has recently started patient on Depakote, will check Depakote level. 1/13 No seizure activity noted so far. Continue monitoring. Continue home depakote.   Chronic A. Fib -Cesar [name] is negative, however patient still somewhat confused, will hold off systemic anticoagulation for today, repeat CBC and reevaluate his mentation tomorrow, and then restart anticoagulation. 1/13 Continue anticoagulation. Rate is controlled.   CKD stage III with non-anion gap metabolic acidosis -Euvolemic -Start bicarb supplement 1/13 Continue bicarb supplement    DVT prophylaxis: Eliquis Code Status: DNR Family Communication: None at the bedside Disposition Plan: From home, will need HH PT at the time of discharge                                Discharge in 1-2 days pending further evaluation by Cardiology    Consultants:   Cardiology     Subjective: Patient denies chest pain, dyspnea, cough, or lightheadedness, reports feeling well. Denies urinary incontinence or retention.   Objective: Vitals:   10/17/20 0839 10/17/20 0841 10/17/20 1110 10/17/20 1547  BP: 125/63 135/63 131/76 (!) 148/70  Pulse: 67 (!) 51 68 64  Resp: 18 16 18 16   Temp:   98.2 F (36.8 C) 98.3 F (36.8 C)  TempSrc:   Oral Oral  SpO2: 99% 97% 98% 98%  Weight:      Height:        Intake/Output Summary (Last 24 hours) at 10/17/2020 1926 Last data filed at 10/17/2020 1850 Gross per 24 hour  Intake 1320 ml  Output --  Net 1320 ml   Filed Weights   10/16/20 1033 10/17/20 0424  Weight: 86.2  kg 86.2 kg    Examination:  General exam: Appears calm and comfortable, in NAD.  Respiratory system: No cough. Respiratory effort normal. Cardiovascular system: S1 & S2 present, iRiRR. Gastrointestinal system: Abdomen is nondistended, soft and nontender.  Central nervous system: Alert and oriented. No focal neurological deficits. Extremities:  Symmetric 5 x 5 power. Skin: No rashes, lesions or ulcers Psychiatry:  Mood & affect appropriate. Pleasant.    Data Reviewed: I have personally reviewed following labs and imaging studies reports  CBC: Recent Labs  Lab 10/16/20 1000 10/17/20 0304  WBC 6.0 6.0  NEUTROABS 4.6  --   HGB 12.7* 11.7*  HCT 39.8 37.0*  MCV 99.7 99.2  PLT 139* 123XX123*   Basic Metabolic Panel: Recent Labs  Lab 10/16/20 1000 10/17/20 0304  NA 139 139  K 4.7 4.2  CL 108 106  CO2 19* 22  GLUCOSE 108* 92  BUN 25* 22  CREATININE 1.46* 1.43*  CALCIUM 9.1 8.6*  MG 2.2  --    GFR: Estimated Creatinine Clearance: 39.6 mL/min (A) (by C-G formula based on SCr of 1.43 mg/dL (H)). Liver Function Tests: Recent Labs  Lab 10/16/20 1000  AST 40  ALT 29  ALKPHOS 61  BILITOT 1.3*  PROT 6.6  ALBUMIN 3.4*   No results for input(s): LIPASE, AMYLASE in the last 168 hours. No results for input(s): AMMONIA in the last 168 hours. Coagulation Profile: No results for input(s): INR, PROTIME in the last 168 hours. Cardiac Enzymes: No results for input(s): CKTOTAL, CKMB, CKMBINDEX, TROPONINI in the last 168 hours. BNP (last 3 results) No results for input(s): PROBNP in the last 8760 hours. HbA1C: No results for input(s): HGBA1C in the last 72 hours. CBG: Recent Labs  Lab 10/17/20 0624  GLUCAP 111*   Lipid Profile: No results for input(s): CHOL, HDL, LDLCALC, TRIG, CHOLHDL, LDLDIRECT in the last 72 hours. Thyroid Function Tests: Recent Labs    10/16/20 1833  TSH 2.149   Anemia Panel: Recent Labs    10/16/20 1833  VITAMINB12 734   Sepsis Labs: No results for input(s): PROCALCITON, LATICACIDVEN in the last 168 hours.  Recent Results (from the past 240 hour(s))  Resp panel by RT-PCR (RSV, Flu A&B, Covid) Nasopharyngeal Swab     Status: None   Collection Time: 10/16/20  1:36 PM   Specimen: Nasopharyngeal Swab; Nasopharyngeal(NP) swabs in vial transport medium  Result Value Ref Range Status    SARS Coronavirus 2 by RT PCR NEGATIVE NEGATIVE Final    Comment: (NOTE) SARS-CoV-2 target nucleic acids are NOT DETECTED.  The SARS-CoV-2 RNA is generally detectable in upper respiratory specimens during the acute phase of infection. The lowest concentration of SARS-CoV-2 viral copies this assay can detect is 138 copies/mL. A negative result does not preclude SARS-Cov-2 infection and should not be used as the sole basis for treatment or other patient management decisions. A negative result may occur with  improper specimen collection/handling, submission of specimen other than nasopharyngeal swab, presence of viral mutation(s) within the areas targeted by this assay, and inadequate number of viral copies(<138 copies/mL). A negative result must be combined with clinical observations, patient history, and epidemiological information. The expected result is Negative.  Fact Sheet for Patients:  EntrepreneurPulse.com.au  Fact Sheet for Healthcare Providers:  IncredibleEmployment.be  This test is no t yet approved or cleared by the [place] FDA and  has been authorized for detection and/or diagnosis of SARS-CoV-2 by FDA under an Emergency Use Authorization (EUA). This EUA will  remain  in effect (meaning this test can be used) for the duration of the COVID-19 declaration under Section 564(b)(1) of the Act, 21 U.S.C.section 360bbb-3(b)(1), unless the authorization is terminated  or revoked sooner.       Influenza A by PCR NEGATIVE NEGATIVE Final   Influenza B by PCR NEGATIVE NEGATIVE Final    Comment: (NOTE) The Xpert Xpress SARS-CoV-2/FLU/RSV plus assay is intended as an aid in the diagnosis of influenza from Nasopharyngeal swab specimens and should not be used as a sole basis for treatment. Nasal washings and aspirates are unacceptable for Xpert Xpress SARS-CoV-2/FLU/RSV testing.  Fact Sheet for  Patients: EntrepreneurPulse.com.au  Fact Sheet for Healthcare Providers: IncredibleEmployment.be  This test is not yet approved or cleared by the [place] FDA and has been authorized for detection and/or diagnosis of SARS-CoV-2 by FDA under an Emergency Use Authorization (EUA). This EUA will remain in effect (meaning this test can be used) for the duration of the COVID-19 declaration under Section 564(b)(1) of the Act, 21 U.S.C. section 360bbb-3(b)(1), unless the authorization is terminated or revoked.     Resp Syncytial Virus by PCR NEGATIVE NEGATIVE Final    Comment: (NOTE) Fact Sheet for Patients: EntrepreneurPulse.com.au  Fact Sheet for Healthcare Providers: IncredibleEmployment.be  This test is not yet approved or cleared by the [place] FDA and has been authorized for detection and/or diagnosis of SARS-CoV-2 by FDA under an Emergency Use Authorization (EUA). This EUA will remain in effect (meaning this test can be used) for the duration of the COVID-19 declaration under Section 564(b)(1) of the Act, 21 U.S.C. section 360bbb-3(b)(1), unless the authorization is terminated or revoked.  Performed at [facility], [place] [address], [place], [place] [address]          Radiology Studies: DG Chest 2 View  Result Date: 10/16/2020 CLINICAL DATA:  Syncope. EXAM: CHEST - 2 VIEW COMPARISON:  August 07, 2019. FINDINGS: Stable cardiomegaly. No pneumothorax is noted. Mild bibasilar atelectasis is noted with small pleural effusions. Bony thorax is unremarkable. IMPRESSION: Mild bibasilar atelectasis with small pleural effusions. Electronically Signed   By: Marijo Conception M.D.   On: 10/16/2020 14:28   CT Head Wo Contrast  Result Date: 10/16/2020 CLINICAL DATA:  Altered mental status after fall from golf cart. EXAM: CT HEAD WITHOUT CONTRAST TECHNIQUE: Contiguous axial images were obtained from the  base of the skull through the vertex without intravenous contrast. COMPARISON:  March 15, 2019. FINDINGS: Brain: Mild diffuse cortical atrophy is noted. Mild chronic ischemic white matter disease is noted. 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 No mass effect or midline shift is noted. Ventricular size is within normal limits. There is no evidence of mass lesion, hemorrhage or acute infarction. Vascular: No hyperdense vessel or unexpected calcification. Skull: Normal. Negative for fracture or focal lesion. Sinuses/Orbits: No acute finding. Other: None. IMPRESSION: Mild diffuse cortical atrophy. Mild chronic ischemic white matter disease. 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 No acute intracranial abnormality seen. Electronically Signed   By: Marijo Conception M.D.   On: 10/16/2020 10:26   ECHOCARDIOGRAM COMPLETE  Result Date: 10/17/2020    ECHOCARDIOGRAM REPORT   Patient Name:   Cesar [name] Date of Exam: 10/17/2020 Medical Rec #:  [id number]       Height:       73.0 in Accession #:    [id number]      Weight:       190.0 lb Date of Birth:  1929/02/26  BSA:          2.105 m Patient Age:    76 years        BP:           131/76 mmHg Patient Gender: M               HR:           68 bpm. Exam Location:  Inpatient Procedure: 2D Echo, Cardiac Doppler and Color Doppler Indications:    Aortic stenosis  History:        Patient has prior history of Echocardiogram examinations, most                 recent 07/31/2019. Arrythmias:Atrial Fibrillation; Risk                 Factors:Dyslipidemia.  Sonographer:    Clayton Lefort RDCS (AE) Referring Phys: [id number] [place]  1. Left ventricular ejection fraction, by estimation, is 50 to 55%. The left ventricle has low normal function. The left ventricle has no regional wall motion abnormalities. There is moderate left ventricular hypertrophy. Left ventricular diastolic parameters are indeterminate.  2. Right ventricular systolic function is mildly reduced. The right  ventricular size is normal. There is normal pulmonary artery systolic pressure. The estimated right ventricular systolic pressure is [id number] mmHg.  3. Left atrial size was severely dilated.  4. Right atrial size was severely dilated.  5. The mitral valve is abnormal. Mild mitral valve regurgitation. Severe mitral annular calcification.  6. The inferior vena cava is dilated in size with >50% respiratory variability, suggesting right atrial pressure of 8 mmHg.  7. The aortic valve is calcified. There is severe calcifcation of the aortic valve. Aortic valve regurgitation is mild to moderate. Severe aortic valve stenosis. Vmax 4.1 m/s, MG 37 mmHg, AVA 0.4 cm^2, DI 0.14 FINDINGS  Left Ventricle: Left ventricular ejection fraction, by estimation, is 50 to 55%. The left ventricle has low normal function. The left ventricle has no regional wall motion abnormalities. The left ventricular internal cavity size was normal in size. There is moderate left ventricular hypertrophy. Left ventricular diastolic parameters are indeterminate. Right Ventricle: The right ventricular size is normal. Right vetricular wall thickness was not well visualized. Right ventricular systolic function is mildly reduced. There is normal pulmonary artery systolic pressure. The tricuspid regurgitant velocity is 2.27 m/s, and with an assumed right atrial pressure of 8 mmHg, the estimated right ventricular systolic pressure is [id number] mmHg. Left Atrium: Left atrial size was severely dilated. Right Atrium: Right atrial size was severely dilated. Pericardium: There is no evidence of pericardial effusion. Mitral Valve: The mitral valve is abnormal. Severe mitral annular calcification. Mild mitral valve regurgitation. Tricuspid Valve: The tricuspid valve is normal in structure. Tricuspid valve regurgitation is trivial. Aortic Valve: The aortic valve is calcified. There is severe calcifcation of the aortic valve. Aortic valve regurgitation is mild to moderate.  Aortic regurgitation PHT measures 359 msec. Severe aortic stenosis is present. Aortic valve mean gradient measures 36.7 mmHg. Aortic valve peak gradient measures 61.7 mmHg. Aortic valve area, by VTI measures 0.43 cm. Pulmonic Valve: The pulmonic valve was not well visualized. Pulmonic valve regurgitation is trivial. Aorta: The aortic root and ascending aorta are structurally normal, with no evidence of dilitation. Venous: The inferior vena cava is dilated in size with greater than 50% respiratory variability, suggesting right atrial pressure of 8 mmHg. IAS/Shunts: The interatrial septum was not well visualized.  LEFT VENTRICLE PLAX 2D  LVIDd:         5.00 cm LVIDs:         3.60 cm LV PW:         1.60 cm LV IVS:        1.30 cm LVOT diam:     2.00 cm LV SV:         41 LV SV Index:   19 LVOT Area:     3.14 cm  RIGHT VENTRICLE            IVC RV Basal diam:  4.10 cm    IVC diam: 2.30 cm RV Mid diam:    2.60 cm RV S prime:     6.20 cm/s TAPSE (M-mode): 1.5 cm LEFT ATRIUM              Index        RIGHT ATRIUM           Index LA diam:        5.70 cm  2.71 cm/m   RA Area:     34.40 cm LA Vol (A2C):   228.0 ml 108.29 ml/m RA Volume:   113.00 ml 53.67 ml/m LA Vol (A4C):   240.0 ml 113.99 ml/m LA Biplane Vol: 235.0 ml 111.62 ml/m  AORTIC VALVE AV Area (Vmax):    0.44 cm AV Area (Vmean):   0.44 cm AV Area (VTI):     0.43 cm AV Vmax:           392.67 cm/s AV Vmean:          284.667 cm/s AV VTI:            0.951 m AV Peak Grad:      61.7 mmHg AV Mean Grad:      36.7 mmHg LVOT Vmax:         55.00 cm/s LVOT Vmean:        39.767 cm/s LVOT VTI:          0.130 m LVOT/AV VTI ratio: 0.14 AI PHT:            359 msec  AORTA Ao Root diam: 3.80 cm Ao Asc diam:  3.30 cm MR Peak grad: 121.0 mmHg  TRICUSPID VALVE MR Mean grad: 72.0 mmHg   TR Peak grad:   20.6 mmHg MR Vmax:      550.00 cm/s TR Vmax:        227.00 cm/s MR Vmean:     393.0 cm/s                           SHUNTS                           Systemic VTI:  0.13 m                            Systemic Diam: 2.00 cm Oswaldo Milian MD Electronically signed by Oswaldo Milian MD Signature Date/Time: 10/17/2020/4:58:28 PM    Final         Scheduled Meds:  apixaban  5 mg Oral BID   atorvastatin  80 mg Oral Daily   divalproex  500 mg Oral QHS   finasteride  5 mg Oral Daily   sodium bicarbonate  650 mg Oral BID   sodium chloride flush  3 mL Intravenous Q12H   Continuous Infusions:   LOS:  1 day    Time spent: 25 minutes    Blain Pais, MD [facility]   If 7PM-7AM, please contact night-coverage [email or web address] Password [facility] 10/17/2020, 7:26 PM

## 2020-10-17 NOTE — Progress Notes (Signed)
Patient Meal tray has been preordered until lunch tomorrow.

## 2020-10-17 NOTE — Evaluation (Signed)
Physical Therapy Evaluation Patient Details Name: Cesar [name] MRN: [id number] DOB: 1928-12-02 Today's Date: 10/17/2020   History of Present Illness  Cesar [name] is a 87 y.o. male with medical history significant of moderate to severe aortic stenosis, chronic A. fib on Eliquis, seizure disorder (seems diagnosed in 2021, has been on Depakote since), CKD stage III, aortic aneurysm, OSA not on CPAP, HLD, presented with syncope  Clinical Impression  Pt fully participated in session. Pt was fatigued upon arrival. Pt was I with use of cane prior to admission. Pt demonstrating increased balance deficits during transfers and ambulation with cane and RW. Pt requiring min-mod A during ambulation with cane with education to pt and wife that use of cane is unsafe at this time. Pt and wife verbalized understanding and agreeable to ambulate with RW and S. Pt will benefit from skilled PT to address deficits in balance, strength, coordination, gait, endurance, safety and use of DME. Pt will benefit from follow up PT to improve balance and gait to maximize independence with functional mobility     Follow Up Recommendations Home health PT    Equipment Recommendations  None recommended by PT    Recommendations for Other Services       Precautions / Restrictions Restrictions Weight Bearing Restrictions: No      Mobility  Bed Mobility Overal bed mobility: Modified Independent                  Transfers Overall transfer level: Needs assistance   Transfers: Sit to/from Stand Sit to Stand: Min assist         General transfer comment: LOB posteriorly requiring min A to regain balance  Ambulation/Gait Ambulation/Gait assistance: Min assist;Mod assist Gait Distance (Feet): 150 Feet Assistive device: Rolling walker (2 wheeled);Straight cane       General Gait Details: performed ambulation with straight cane with shuffling gait pattern wtih increased lateral sway and narrow BOS,  increased LOB requiring min-mod A throughout ambulation; transitioned to RW with pt requiring S-min guard during ambulation wthi improved gait mechanics, improved step length and height. pt with 1 LOB with turning with RW requiring mod A to regain balance  Stairs            Wheelchair Mobility    Modified Rankin (Stroke Patients Only)       Balance Overall balance assessment: Needs assistance   Sitting balance-Leahy Scale: Fair     Standing balance support: Single extremity supported Standing balance-Leahy Scale: Poor                               Pertinent Vitals/Pain Pain Assessment: No/denies pain    Home Living Family/patient expects to be discharged to:: Private residence Living Arrangements: Spouse/significant other Available Help at Discharge: Family;Available 24 hours/day Type of Home: House Home Access: Stairs to enter Entrance Stairs-Rails: Right Entrance Stairs-Number of Steps: 2 Home Layout: Two level;Able to live on main level with bedroom/bathroom Home Equipment: Gilford Rile - 2 wheels;Bedside commode;Cane - single point      Prior Function Level of Independence: Independent with assistive device(s)               Hand Dominance   Dominant Hand: Left    Extremity/Trunk Assessment        Lower Extremity Assessment Lower Extremity Assessment: Generalized weakness       Communication   Communication: HOH  Cognition Arousal/Alertness: Awake/alert Behavior During Therapy:  Flat affect Overall Cognitive Status: Within Functional Limits for tasks assessed                                        General Comments General comments (skin integrity, edema, etc.): increased time spent educating wife due to pt being HOB. education on need to transition to RW at this time for safety and pt will need 24 hour S/A due to balance deficits, wife verbalized understanding    Exercises     Assessment/Plan    PT Assessment  Patient needs continued PT services  PT Problem List Decreased strength;Decreased mobility;Decreased safety awareness;Decreased coordination;Decreased activity tolerance;Decreased balance;Decreased knowledge of use of DME       PT Treatment Interventions Therapeutic exercise;DME instruction;Gait training;Balance training;Stair training;Functional mobility training;Therapeutic activities;Patient/family education    PT Goals (Current goals can be found in the Care Plan section)  Acute Rehab PT Goals Patient Stated Goal: to go home PT Goal Formulation: With patient/family Time For Goal Achievement: 10/31/20 Potential to Achieve Goals: Good    Frequency Min 3X/week   Barriers to discharge        Co-evaluation               AM-PAC PT "6 Clicks" Mobility  Outcome Measure Help needed turning from your back to your side while in a flat bed without using bedrails?: None Help needed moving from lying on your back to sitting on the side of a flat bed without using bedrails?: None Help needed moving to and from a bed to a chair (including a wheelchair)?: A Little Help needed standing up from a chair using your arms (e.g., wheelchair or bedside chair)?: A Little Help needed to walk in hospital room?: A Lot Help needed climbing 3-5 steps with a railing? : A Little 6 Click Score: 19    End of Session Equipment Utilized During Treatment: Gait belt Activity Tolerance: Patient tolerated treatment well Patient left: in bed;with call bell/phone within reach;with bed alarm set Nurse Communication: Mobility status PT Visit Diagnosis: Unsteadiness on feet (R26.81);Other abnormalities of gait and mobility (R26.89);Muscle weakness (generalized) (M62.81)    Time: 6378-5885 PT Time Calculation (min) (ACUTE ONLY): 21 min   Charges:   PT Evaluation $PT Eval Low Complexity: Cameron, DPT Acute Rehabilitation Services [phone]  [place] 10/17/2020, 2:12 PM

## 2020-10-17 NOTE — Progress Notes (Signed)
  Echocardiogram 2D Echocardiogram has been performed.  Cesar [name] 10/17/2020, 2:14 PM

## 2020-10-18 ENCOUNTER — Inpatient Hospital Stay (HOSPITAL_COMMUNITY): Payer: Medicare Other

## 2020-10-18 DIAGNOSIS — I350 Nonrheumatic aortic (valve) stenosis: Secondary | ICD-10-CM

## 2020-10-18 DIAGNOSIS — Z01810 Encounter for preprocedural cardiovascular examination: Secondary | ICD-10-CM

## 2020-10-18 DIAGNOSIS — R55 Syncope and collapse: Secondary | ICD-10-CM | POA: Diagnosis not present

## 2020-10-18 LAB — GLUCOSE, CAPILLARY: Glucose-Capillary: 108 mg/dL — ABNORMAL HIGH (ref 70–99)

## 2020-10-18 MED ORDER — SODIUM CHLORIDE 0.9% FLUSH
3.0000 mL | Freq: Two times a day (BID) | INTRAVENOUS | Status: DC
  Administered 2020-10-18 – 2020-10-21 (×4): 3 mL via INTRAVENOUS

## 2020-10-18 MED ORDER — SODIUM CHLORIDE 0.9 % IV SOLN: INTRAVENOUS | Status: DC

## 2020-10-18 MED ORDER — SODIUM CHLORIDE 0.9 % IV SOLN: INTRAVENOUS | Status: AC

## 2020-10-18 MED ORDER — IOHEXOL 350 MG/ML SOLN
95.0000 mL | Freq: Once | INTRAVENOUS | Status: AC | PRN
  Administered 2020-10-18: 95 mL via INTRAVENOUS

## 2020-10-18 MED ORDER — HEPARIN (PORCINE) 25000 UT/250ML-% IV SOLN
1000.0000 [IU]/h | INTRAVENOUS | Status: DC
  Administered 2020-10-18: 1200 [IU]/h via INTRAVENOUS
  Administered 2020-10-19: 1050 [IU]/h via INTRAVENOUS
  Administered 2020-10-20: 1000 [IU]/h via INTRAVENOUS
  Filled 2020-10-18 (×4): qty 250

## 2020-10-18 NOTE — Progress Notes (Signed)
PT Cancellation Note  Patient Details Name: Cesar [name] MRN: [id number] DOB: 26-Mar-1929   Cancelled Treatment:    Reason Eval/Treat Not Completed: Fatigue/lethargy limiting ability to participate. Pt fatigued and upset, pt thought he was going to discharge today and is not.   [name], DPT Acute Rehabilitation Services [phone]   [place] 10/18/2020, 1:28 PM

## 2020-10-18 NOTE — Progress Notes (Signed)
ANTICOAGULATION CONSULT NOTE - Initial Consult  Pharmacy Consult for heparin Indication: atrial fibrillation and hx VTE  No Known Allergies  Patient Measurements: Height: 6\' 1"  (185.4 cm) Weight: 82.6 kg (182 lb 3.2 oz) (scale c) IBW/kg (Calculated) : 79.9 Heparin Dosing Weight: 86.2 kg   Vital Signs: Temp: 97.6 F (36.4 C) (01/14 1104) Temp Source: Oral (01/14 1104) BP: 117/57 (01/14 1104) Pulse Rate: 65 (01/14 1104)  Labs: Recent Labs    10/16/20 1000 10/17/20 0304  HGB 12.7* 11.7*  HCT 39.8 37.0*  PLT 139* 121*  CREATININE 1.46* 1.43*    Estimated Creatinine Clearance: 39.6 mL/min (A) (by C-G formula based on SCr of 1.43 mg/dL (H)).   Medical History: History reviewed. No pertinent past medical history.  Medications:  Scheduled:  . atorvastatin  80 mg Oral Daily  . divalproex  500 mg Oral QHS  . finasteride  5 mg Oral Daily  . sodium bicarbonate  650 mg Oral BID  . sodium chloride flush  3 mL Intravenous Q12H  . sodium chloride flush  3 mL Intravenous Q12H    Assessment: 39 yom presenting with syncopal episode, now severe AS, on apixaban (LD 1/14@0812 ).   Hgb 11.7, plt 121. No s/sx of bleeding or infusion issues. Will start heparin infusion 12 hours post last dose.   Will monitor heparin level and aPTT until correlate given recent DOAC.   Goal of Therapy:  Heparin level 0.3-0.7 units/ml aPTT 66-102 seconds Monitor platelets by anticoagulation protocol: Yes   Plan:  Start heparin infusion at 1200 units/hr on 1/14@2000  Order heparin level and aPTT in 8 hours  Monitor daily HL, aPTT, CBC, and for s/sx of bleeding  Antonietta Jewel, PharmD, [facility] Pharmacist  Phone: [phone] 10/18/2020 12:45 PM  Please check AMION for all [facility] phone numbers After 10:00 PM, call [place] [phone]

## 2020-10-18 NOTE — Progress Notes (Addendum)
Case reviewed with Dr. Acie Fredrickson who discussed with Dr. Angelena Form. Given syncope and finding of severe AS on echo, will plan inpatient eval instead. Pt has been on Eliquis so will change to heparin per pharmacy and plan [facility] on Monday. Dr. Acie Fredrickson reviewed with pt and family and we will also take on cardiology service. Cath orders written per our discussion, gentle hydration the AM of cath at 75/hr. We are also giving temporary gentle hydration today given he'll be getting CT scans arranged by TAVR team this afternoon. The patient is scheduled for his cath with Dr. Saunders Revel on Monday at 7:30am.  I released his NPO order but otherwise orders are under sign/held.  Rally Ouch PA-C

## 2020-10-18 NOTE — Consult Note (Addendum)
Structural Heart Team Consult Note  Chief Complaint: syncope   History of Present Illness: 87 yo male with history of permanent atrial fibrillation on Eliquis, aortic stenosis, arterial thrombosis, AAA, prior CVA, CKD stage 3, hyperlipidemia, sleep apnea and thoracic aortic aneurysm admitted with syncope. Structural heart team consulted to discuss potential TAVR. He has two charts in [place]. He has been followed for moderate aortic valve stenosis by Dr. [name]. Echo in October 2020 with moderate AS (mean gradient 26 mHg, peak gradient 41 mmHg, AVA 0.99 cm2). LVEF 60-65% at that time. His ascending thoracic aortic aneurysm has been followed by Dr. [name] and was last seen by Dr. [name] in 2020. Last chest CTA in February 2020 with 4.5 cm aneurysm of the ascending aorta. No plans for follow up CTA chest given his age. 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   He was admitted with 10/16/20 after a syncopal episode. Echo 10/17/20 with LVEF=50-55%, no wall motion abnormalities. Mild reduction RV systolic function. Severe bi-atrial enlargement. Mild mitral regurgitation with severe MAC. The aortic valve leaflets are thickened and calcified with severe aortic stenosis, mild to moderate aortic insufficiency. Mean gradient 37 mmHg, AVA 0.4cm2, dimensionless index 0.14. Eliquis given on the morning of 10/18/20 and now on hold for cardiac cath.   He tells me today that he has been doing well at home prior to passing out. He had been working on a tractor and got into the golf cart to ride back to the house and then woke up on the floor in the house. No chest pain at home. Occasional dyspnea over the past few months. Rare lower  extremity edema. He has full dentures on the top and partial dentures on the bottom. He has several of his teeth but reports no active issues. He lives in [place] with his wife. 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   Primary Cardiologist: [name] Referring Cardiologist: Nahser,Phil  Past Medical History:  Diagnosis Date   AAA (abdominal aortic aneurysm) ([place])      Possibly an erroneous entry: only imaging i see is 2017 and specifically shows "no aneurysmal dilation" of abdominal aorta   Aortic stenosis      a. mild-mod by echo 06/2016.   Atrial fibrillation ([place])     CKD (chronic kidney disease), stage III ([place])      baseline Cr 1.4-1.5 in 2016-2017 by PCP)   CVA (cerebral infarction)     Depression     DVT (deep venous thrombosis) ([place]) 2006   Dyslipidemia     Hematuria     HOH (hard of hearing)     Hypercholesterolemia     OSA (obstructive sleep apnea)      intol to CPAP   Permanent atrial fibrillation ([facility])     Prostate enlargement     Pulmonary nodules     Stroke (cerebrum) ([place])     Thoracic ascending aortic aneurysm ([place])      4.5cm in 11/2018, stable since 2016   TIA (transient ischemic attack)             Past Surgical History:  Procedure Laterality Date   EMBOLECTOMY Left 07/02/2016    Procedure: EMBOLECTOMY LEFT FEMORAL ARTERY;  Surgeon: [name], MD;  Location: [facility] OR;  Service: Vascular;  Laterality: Left;   MESENTERIC ARTERY BYPASS N/A 08/06/2019    Procedure: SUPERIOR MESENTERIC ARTERY THROMBECTOMY;  Surgeon: [name], MD;  Location: [facility] OR;  Service: Vascular;  Laterality: N/A;   [place]   08/24/2002    markedly positive,subtle lateral ischemia   PATCH ANGIOPLASTY   08/06/2019    Procedure: Patch Angioplasty of superior messenteric artery with RIGHT femoral vein;  Surgeon: [name], MD;  Location: [facility];  Service: Vascular;;   PROSTATE SURGERY       US  ECHOCARDIOGRAPHY   01/06/2012    mod. AOV ca+,mild to mod. AS,mod mostly posterior MAC-mild MR      Current Facility-Administered Medications  Medication Dose Route Frequency Provider Last Rate Last Admin   0.9 %  sodium chloride infusion   Intravenous Continuous Dunn, Dayna N, PA-C       acetaminophen (TYLENOL) tablet 650 mg  650 mg Oral Q6H PRN [name], MD       Or   acetaminophen (TYLENOL) suppository 650 mg  650 mg Rectal Q6H PRN [name], MD       atorvastatin (LIPITOR) tablet 80 mg  80 mg Oral Daily [name], MD   80 mg at 10/18/20 4656   divalproex (DEPAKOTE ER) 24 hr tablet 500 mg  500 mg Oral QHS [name], MD   500 mg at 10/17/20 2039   finasteride (PROSCAR) tablet 5 mg  5 mg Oral Daily [name], MD   5 mg at 10/18/20 8127   hydrALAZINE (APRESOLINE) tablet 25 mg  25 mg Oral Q6H PRN Lequita Halt, MD       ondansetron [facility]) tablet 4 mg  4 mg Oral Q6H PRN [name], MD       Or   ondansetron [facility]) injection 4 mg  4 mg Intravenous Q6H PRN [name], MD       sodium bicarbonate tablet 650 mg  650 mg Oral BID [name], MD   650 mg at 10/18/20 5170   sodium chloride flush (NS) 0.9 % injection 3 mL  3 mL Intravenous Q12H [name], MD   3 mL at 10/18/20 0174   sodium chloride flush (NS) 0.9 % injection 3 mL  3 mL Intravenous Q12H Dunn, Dayna N, PA-C       Allergies: Flomax   Social History:  The patient  reports that he quit smoking about 68 years ago. His smoking use included cigarettes. He has a 10.00 pack-year smoking history. He quit smokeless tobacco use about 44 years ago. He reports current alcohol use. He reports that he does not use drugs.    Family History:  The patient's family history includes Diabetes in his father; Heart failure in his mother; Stroke in his father.   Review of Systems:  As stated in the HPI and otherwise negative.   BP (!) 117/57 (BP Location: Left Arm)    Pulse 65    Temp 97.6 F (36.4 C)  (Oral)    Resp 20    Ht _0  (1.854 m)    Wt 82.6 kg Comment: scale c   SpO2 97%    BMI 24.04 kg/m   Physical Examination: General: Well developed, well nourished, NAD  HEENT: OP clear, mucus membranes moist  SKIN: warm, dry. No rashes. Neuro: No focal deficits  Musculoskeletal: Muscle strength 5/5 all ext  Psychiatric:  Mood and affect normal  Neck: No JVD, no carotid bruits, no thyromegaly, no lymphadenopathy.  Lungs:Clear bilaterally, no wheezes, rhonci, crackles Cardiovascular: Regular rate and rhythm. Loud, harsh, late peaking systolic murmur.  Abdomen:Soft. Bowel sounds present. Non-tender.  Extremities: No lower extremity edema. Pulses are 2 + in the bilateral DP/PT.  EKG:  EKG is reviewed by me and shows atrial fibrillation, LVH, poor septal R wave progression  Echo 10/17/20:   1. Left ventricular ejection fraction, by estimation, is 50 to 55%. The  left ventricle has low normal function. The left ventricle has no regional  wall motion abnormalities. There is moderate left ventricular hypertrophy.  Left ventricular diastolic  parameters are indeterminate.   2. Right ventricular systolic function is mildly reduced. The right  ventricular size is normal. There is normal pulmonary artery systolic  pressure. The estimated right ventricular systolic pressure is 26.7 mmHg.   3. Left atrial size was severely dilated.   4. Right atrial size was severely dilated.   5. The mitral valve is abnormal. Mild mitral valve regurgitation. Severe  mitral annular calcification.   6. The inferior vena cava is dilated in size with >50% respiratory  variability, suggesting right atrial pressure of 8 mmHg.   7. The aortic valve is calcified. There is severe calcifcation of the  aortic valve. Aortic valve regurgitation is mild to moderate. Severe  aortic valve stenosis. Vmax 4.1 m/s, MG 37 mmHg, AVA 0.4 cm^2, DI 0.14   FINDINGS   Left Ventricle: Left ventricular ejection fraction, by estimation,  is 50  to 55%. The left ventricle has low normal function. The left ventricle has  no regional wall motion abnormalities. The left ventricular internal  cavity size was normal in size.  There is moderate left ventricular hypertrophy. Left ventricular diastolic  parameters are indeterminate.   Right Ventricle: The right ventricular size is normal. Right vetricular  wall thickness was not well visualized. Right ventricular systolic  function is mildly reduced. There is normal pulmonary artery systolic  pressure. The tricuspid regurgitant velocity  is 2.27 m/s, and with an assumed right atrial pressure of 8 mmHg, the  estimated right ventricular systolic pressure is 12.4 mmHg.   Left Atrium: Left atrial size was severely dilated.   Right Atrium: Right atrial size was severely dilated.   Pericardium: There is no evidence of pericardial effusion.   Mitral Valve: The mitral valve is abnormal. Severe mitral annular  calcification. Mild mitral valve regurgitation.   Tricuspid Valve: The tricuspid valve is normal in structure. Tricuspid  valve regurgitation is trivial.   Aortic Valve: The aortic valve is calcified. There is severe calcifcation  of the aortic valve. Aortic valve regurgitation is mild to moderate.  Aortic regurgitation PHT measures 359 msec. Severe aortic stenosis is  present. Aortic valve mean gradient  measures 36.7 mmHg. Aortic valve peak gradient measures 61.7 mmHg. Aortic  valve area, by VTI measures 0.43 cm.   Pulmonic Valve: The pulmonic valve was not well visualized. Pulmonic valve  regurgitation is trivial.   Aorta: The aortic root and ascending aorta are structurally normal, with  no evidence of dilitation.   Venous: The inferior vena cava is dilated in size with greater than 50%  respiratory variability, suggesting right atrial pressure of 8 mmHg.   IAS/Shunts: The interatrial septum was not well visualized.      LEFT VENTRICLE  PLAX 2D  LVIDd:          5.00 cm  LVIDs:  3.60 cm  LV PW:         1.60 cm  LV IVS:        1.30 cm  LVOT diam:     2.00 cm  LV SV:         41  LV SV Index:   19  LVOT Area:     3.14 cm      RIGHT VENTRICLE            IVC  RV Basal diam:  4.10 cm    IVC diam: 2.30 cm  RV Mid diam:    2.60 cm  RV S prime:     6.20 cm/s  TAPSE (M-mode): 1.5 cm   LEFT ATRIUM              Index        RIGHT ATRIUM           Index  LA diam:        5.70 cm  2.71 cm/m   RA Area:     34.40 cm  LA Vol (A2C):   228.0 ml 108.29 ml/m RA Volume:   113.00 ml 53.67 ml/m  LA Vol (A4C):   240.0 ml 113.99 ml/m  LA Biplane Vol: 235.0 ml 111.62 ml/m   AORTIC VALVE  AV Area (Vmax):    0.44 cm  AV Area (Vmean):   0.44 cm  AV Area (VTI):     0.43 cm  AV Vmax:           392.67 cm/s  AV Vmean:          284.667 cm/s  AV VTI:            0.951 m  AV Peak Grad:      61.7 mmHg  AV Mean Grad:      36.7 mmHg  LVOT Vmax:         55.00 cm/s  LVOT Vmean:        39.767 cm/s  LVOT VTI:          0.130 m  LVOT/AV VTI ratio: 0.14  AI PHT:            359 msec     AORTA  Ao Root diam: 3.80 cm  Ao Asc diam:  3.30 cm   MR Peak grad: 121.0 mmHg  TRICUSPID VALVE  MR Mean grad: 72.0 mmHg   TR Peak grad:   20.6 mmHg  MR Vmax:      550.00 cm/s TR Vmax:        227.00 cm/s  MR Vmean:     393.0 cm/s                            SHUNTS                            Systemic VTI:  0.13 m                            Systemic Diam: 2.00 cm   Recent Labs: 10/16/2020: ALT 29; Magnesium 2.2; TSH 2.149 10/17/2020: BUN 22; Creatinine, Ser 1.43; Hemoglobin 11.7; Platelets 121; Potassium 4.2; Sodium 139    Wt Readings from Last 3 Encounters:  10/18/20 82.6 kg     Other studies Reviewed: Additional studies/ records that were reviewed today include: echo images, office notes and  hospital notes Review of the above records demonstrates: severe aortic stenosis   Assessment and Plan:   1. Severe Aortic Valve Stenosis: He has severe, stage D aortic valve  stenosis. I have personally reviewed the echo images. The aortic valve is thickened, calcified with limited leaflet mobility. I think he would benefit from AVR. Given advanced age, he is not a good candidate for conventional AVR by surgical approach. I think he may be a good candidate for TAVR.   STS Risk Score: Risk of Mortality: 3.775% Renal Failure: 2.829% Permanent Stroke: 2.875% Prolonged Ventilation: 11.131% DSW Infection: 0.091% Reoperation: 4.394% Morbidity or Mortality: 18.424% Short Length of Stay: 14.577% Long Length of Stay: 12.849%  [place]  KCCQ-12 10/21/2020  1 a. Ability to shower/bathe Not at all limited  1 b. Ability to walk 1 block Slightly limited  1 c. Ability to hurry/jog Other, Did not do  2. Edema feet/ankles/legs Less than once a week  3. Limited by fatigue 1-2 times a week  4. Limited by dyspnea Less than once a week  5. Sitting up / on 3+ pillows Never over the past 2 weeks  6. Limited enjoyment of life Slightly limited  7. Rest of life w/ symptoms Mostly satisfied  8 a. Participation in hobbies Did not limit at all  8 b. Participation in chores Did not limit at all  8 c. Visiting family/friends Did not limit at all       I have reviewed the natural history of aortic stenosis with the patient and their family members  who are present today. We have discussed the limitations of medical therapy and the poor prognosis associated with symptomatic aortic stenosis. We have reviewed potential treatment options, including palliative medical therapy, conventional surgical aortic valve replacement, and transcatheter aortic valve replacement. We discussed treatment options in the context of the patient's specific comorbid medical conditions.   He would like to proceed with planning for TAVR. We will arrange his CT scans this weekend. Right and left heart catheterization on Monday. He received Eliquis earlier this am so cath  cannot be completed today. Risks and benefits of the cath procedure and the valve procedure are reviewed with the patient. We will arrange carotid artery dopplers, PT assessment and have one of our CT surgeons see him next week.  If there is no significant CAD, we can hopefully proceed with inpatient TAVR next week on Tuesday.   Signed, [name], MD 10/18/2020 12:40 PM    [address], [place], [place]  [address] Phone: [phone]; Fax: [phone]

## 2020-10-18 NOTE — Plan of Care (Signed)
°  Problem: Education: ?Goal: Knowledge of General Education information will improve ?Description: Including pain rating scale, medication(s)/side effects and non-pharmacologic comfort measures ?Outcome: Progressing ?  ?Problem: Health Behavior/Discharge Planning: ?Goal: Ability to manage health-related needs will improve ?Outcome: Progressing ?  ?Problem: Clinical Measurements: ?Goal: Will remain free from infection ?Outcome: Progressing ?  ?

## 2020-10-18 NOTE — Progress Notes (Signed)
    Ive discussed with Dr. [name] and with Dr. Angelena Form. They agree that he needs to stay and have cath and then likely TAVR during this admission because of the severity of this AS  Unable to do cath today because he had Eliquis this am  Will schedule cath for Monday  Dr. Wilmon Arms will see him for consultation today  He has seen dr. [name] in 2020 for ascending aortic aneurism.     Ive discussed also with daughter in [place]     Mertie Moores, MD  10/18/2020 12:00 PM    [address],  [place], [place]  [address] Phone: [phone]; Fax: [phone]

## 2020-10-18 NOTE — Plan of Care (Signed)
°  Problem: Education: Goal: Knowledge of General Education information will improve Description: Including pain rating scale, medication(s)/side effects and non-pharmacologic comfort measures 10/18/2020 0035 by Mollie Germany, RN Outcome: Progressing 10/18/2020 0029 by Mollie Germany, RN Outcome: Progressing   Problem: Health Behavior/Discharge Planning: Goal: Ability to manage health-related needs will improve 10/18/2020 0035 by Mollie Germany, RN Outcome: Progressing 10/18/2020 0029 by Mollie Germany, RN Outcome: Progressing   Problem: Clinical Measurements: Goal: Ability to maintain clinical measurements within normal limits will improve Outcome: Progressing Goal: Will remain free from infection Outcome: Progressing

## 2020-10-18 NOTE — Progress Notes (Signed)
Pre TAVR has been completed.   Preliminary results in CV Proc.   Abram Sander 10/18/2020 2:48 PM
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
## 2020-10-19 DIAGNOSIS — I350 Nonrheumatic aortic (valve) stenosis: Secondary | ICD-10-CM | POA: Diagnosis not present

## 2020-10-19 DIAGNOSIS — R55 Syncope and collapse: Secondary | ICD-10-CM | POA: Diagnosis not present

## 2020-10-19 LAB — BASIC METABOLIC PANEL
Anion gap: 9 (ref 5–15)
BUN: 24 mg/dL — ABNORMAL HIGH (ref 8–23)
CO2: 24 mmol/L (ref 22–32)
Calcium: 8.7 mg/dL — ABNORMAL LOW (ref 8.9–10.3)
Chloride: 104 mmol/L (ref 98–111)
Creatinine, Ser: 1.43 mg/dL — ABNORMAL HIGH (ref 0.61–1.24)
GFR, Estimated: 47 mL/min — ABNORMAL LOW (ref >60)
Glucose, Bld: 99 mg/dL (ref 70–99)
Potassium: 4.5 mmol/L (ref 3.5–5.1)
Sodium: 137 mmol/L (ref 135–145)

## 2020-10-19 LAB — GLUCOSE, CAPILLARY: Glucose-Capillary: 99 mg/dL (ref 70–99)

## 2020-10-19 LAB — APTT
aPTT: 123 seconds — ABNORMAL HIGH (ref 24–36)
aPTT: 108 seconds — ABNORMAL HIGH (ref 24–36)

## 2020-10-19 LAB — HEPARIN LEVEL (UNFRACTIONATED)
Heparin Unfractionated: 0.79 IU/mL — ABNORMAL HIGH (ref 0.30–0.70)
Heparin Unfractionated: 0.62 IU/mL (ref 0.30–0.70)

## 2020-10-19 MED ORDER — ATORVASTATIN CALCIUM 80 MG PO TABS
80.0000 mg | ORAL_TABLET | Freq: Every day | ORAL | Status: DC
  Administered 2020-10-20 – 2020-10-22 (×3): 80 mg via ORAL
  Filled 2020-10-19 (×3): qty 1

## 2020-10-19 NOTE — Progress Notes (Signed)
ANTICOAGULATION CONSULT NOTE   Pharmacy Consult for heparin Indication: atrial fibrillation and hx VTE  No Known Allergies  Patient Measurements: Height: 6\' 1"  (185.4 cm) Weight: 83.1 kg (183 lb 1.6 oz) IBW/kg (Calculated) : 79.9 Heparin Dosing Weight: 86.2 kg   Vital Signs: Temp: 97.7 F (36.5 C) (01/15 1118) Temp Source: Oral (01/15 1118) BP: 107/70 (01/15 1118) Pulse Rate: 75 (01/15 1118)  Labs: Recent Labs    10/17/20 0304 10/19/20 0324 10/19/20 1424  HGB 11.7*  --   --   HCT 37.0*  --   --   PLT 121*  --   --   APTT  --  123*  --   HEPARINUNFRC  --  0.79* 0.62  CREATININE 1.43* 1.43*  --     Estimated Creatinine Clearance: 39.6 mL/min (A) (by C-G formula based on SCr of 1.43 mg/dL (H)).   Medical History: History reviewed. No pertinent past medical history.  Medications:  Scheduled:  . [START ON 10/20/2020] atorvastatin  80 mg Oral QHS  . divalproex  500 mg Oral QHS  . finasteride  5 mg Oral Daily  . sodium bicarbonate  650 mg Oral BID  . sodium chloride flush  3 mL Intravenous Q12H  . sodium chloride flush  3 mL Intravenous Q12H    Assessment: 12 yom presenting with syncopal episode, now severe AS, on apixaban (LD 1/14@0812 ).   Hgb 11.7, plt 121. No s/sx of bleeding or infusion issues. Will start heparin infusion 12 hours post last dose.   Heparin level 0.62 which is within goal, aptt appears to be very close to correlating at 108s. No bleeding issues noted. No cbc done today, will order for tomorrow. Will make slight reduction in heparin rate and do not see a need in following aptt any longer.   Goal of Therapy:  Heparin level 0.3-0.7 units/ml aPTT 66-102 seconds Monitor platelets by anticoagulation protocol: Yes   Plan:  Reduce Heparin to 1000 units/hr Monitor daily HL, CBC, and for s/sx of bleeding  Erin Hearing PharmD., BCPS Clinical Pharmacist 10/19/2020 3:27 PM

## 2020-10-19 NOTE — Progress Notes (Signed)
[place] for heparin Indication: atrial fibrillation and hx VTE  No Known Allergies  Patient Measurements: Height: 6\' 1"  (185.4 cm) Weight: 83.1 kg (183 lb 1.6 oz) IBW/kg (Calculated) : 79.9 Heparin Dosing Weight: 86.2 kg   Vital Signs: Temp: 97.9 F (36.6 C) (01/15 0332) Temp Source: Oral (01/15 0332) BP: 131/79 (01/15 0332) Pulse Rate: 67 (01/15 0332)  Labs: Recent Labs    10/16/20 1000 10/17/20 0304 10/19/20 0324  HGB 12.7* 11.7*  --   HCT 39.8 37.0*  --   PLT 139* 121*  --   APTT  --   --  123*  HEPARINUNFRC  --   --  0.79*  CREATININE 1.46* 1.43* 1.43*    Estimated Creatinine Clearance: 39.6 mL/min (A) (by C-G formula based on SCr of 1.43 mg/dL (H)).   Medical History: History reviewed. No pertinent past medical history.  Medications:  Scheduled:   atorvastatin  80 mg Oral Daily   divalproex  500 mg Oral QHS   finasteride  5 mg Oral Daily   sodium bicarbonate  650 mg Oral BID   sodium chloride flush  3 mL Intravenous Q12H   sodium chloride flush  3 mL Intravenous Q12H    Assessment: 73 yom presenting with syncopal episode, now severe AS, on apixaban ([place] 1/14@0812 ).   Hgb 11.7, plt 121. No s/sx of bleeding or infusion issues. Will start heparin infusion 12 hours post last dose.   Will monitor heparin level and aPTT until correlate given recent DOAC.   1/15 AM update:  Heparin level and aPTT elevated Should be able to start using heparin level only soon No issues per RN  Goal of Therapy:  Heparin level 0.3-0.7 units/ml aPTT 66-102 seconds Monitor platelets by anticoagulation protocol: Yes   Plan:  Dec heparin to 1050 units/hr Order heparin level and aPTT in 8 hours  Monitor daily HL, aPTT, CBC, and for s/sx of bleeding  Cesar [name], PharmD, BCPS Clinical Pharmacist Phone: [phone]

## 2020-10-19 NOTE — Progress Notes (Signed)
Progress Note  Patient Name: Cesar [name] Date of Encounter: 10/19/2020  [facility] Cardiologist: [name], MD   Subjective   No events overnight. Echo shows severe AS. In the setting of syncope, has been evaluated by the structural heart team for TAVR, probably early next week. Need to let Eliquis wash out. Plan for CT scans over the weekend. Cath Monday morning.  Inpatient Medications    Scheduled Meds:  atorvastatin  80 mg Oral Daily   divalproex  500 mg Oral QHS   finasteride  5 mg Oral Daily   sodium bicarbonate  650 mg Oral BID   sodium chloride flush  3 mL Intravenous Q12H   sodium chloride flush  3 mL Intravenous Q12H   Continuous Infusions:  heparin 1,050 Units/hr (10/19/20 0544)   PRN Meds: acetaminophen **OR** acetaminophen, hydrALAZINE, ondansetron **OR** ondansetron (ZOFRAN) IV   Vital Signs    Vitals:   10/18/20 1104 10/18/20 2020 10/19/20 0332 10/19/20 0800  BP: (!) 117/57 (!) 146/67 131/79 111/67  Pulse: 65 67 67 65  Resp: 20 18 18 20   Temp: 97.6 F (36.4 C) 97.7 F (36.5 C) 97.9 F (36.6 C)   TempSrc: Oral Oral Oral   SpO2: 97% 97% 97% 100%  Weight:   83.1 kg   Height:        Intake/Output Summary (Last 24 hours) at 10/19/2020 0925 Last data filed at 10/19/2020 0800 Gross per 24 hour  Intake 1341.78 ml  Output 1800 ml  Net -458.22 ml   Last 3 Weights 10/19/2020 10/18/2020 10/17/2020  Weight (lbs) 183 lb 1.6 oz 182 lb 3.2 oz 190 lb 0.6 oz  Weight (kg) 83.054 kg 82.645 kg 86.2 kg      Telemetry    afib with controlled V reponse - Personally Reviewed  ECG    N/A  Physical Exam    Physical Exam: Blood pressure 111/67, pulse 65, temperature 97.9 F (36.6 C), temperature source Oral, resp. rate 20, height 6\' 1"  (1.854 m), weight 83.1 kg, SpO2 100 %.  GEN:  Well nourished, well developed in no acute distress HEENT: Normal NECK: No JVD; No carotid bruits LYMPHATICS: No lymphadenopathy CARDIAC: irregl. Irreg. 3/6  systolic murmur  RESPIRATORY:  Clear to auscultation without rales, wheezing or rhonchi  ABDOMEN: Soft, non-tender, non-distended MUSCULOSKELETAL:  No edema; No deformity  SKIN: Warm and dry NEUROLOGIC:  Alert and oriented x 3, hard of hearing    Labs    High Sensitivity Troponin:  No results for input(s): TROPONINIHS in the last 720 hours.    Chemistry Recent Labs  Lab 10/16/20 1000 10/17/20 0304 10/19/20 0324  NA 139 139 137  K 4.7 4.2 4.5  CL 108 106 104  CO2 19* 22 24  GLUCOSE 108* 92 99  BUN 25* 22 24*  CREATININE 1.46* 1.43* 1.43*  CALCIUM 9.1 8.6* 8.7*  PROT 6.6  --   --   ALBUMIN 3.4*  --   --   AST 40  --   --   ALT 29  --   --   ALKPHOS 61  --   --   BILITOT 1.3*  --   --   GFRNONAA 46* 47* 47*  ANIONGAP 12 11 9      Hematology Recent Labs  Lab 10/16/20 1000 10/17/20 0304  WBC 6.0 6.0  RBC 3.99* 3.73*  HGB 12.7* 11.7*  HCT 39.8 37.0*  MCV 99.7 99.2  MCH 31.8 31.4  MCHC 31.9 31.6  RDW 13.4  13.4  PLT 139* 121*    BNPNo results for input(s): BNP, PROBNP in the last 168 hours.   DDimer No results for input(s): DDIMER in the last 168 hours.   Radiology    CT CORONARY MORPH W/CTA COR W/SCORE W/CA W/CM &/OR WO/CM  Addendum Date: 10/19/2020   ADDENDUM REPORT: 10/19/2020 07:35 EXAM: OVER-READ INTERPRETATION  CT CHEST The following report is an over-read performed by radiologist Dr. Samara Snide [facility], [place] on 10/19/2020. This over-read does not include interpretation of cardiac or coronary anatomy or pathology. The coronary CTA interpretation by the cardiologist is attached. COMPARISON:  11/23/2018 chest CT. FINDINGS: Please see the separate concurrent chest CT angiogram report for details. IMPRESSION: Please see the separate concurrent chest CT angiogram report for details. Electronically Signed   By: Ilona Sorrel M.D.   On: 10/19/2020 07:35   Result Date: 10/19/2020 CLINICAL DATA:  Severe Aortic Stenosis. EXAM: Cardiac TAVR CT TECHNIQUE: The  patient was scanned on a [employer]. A 100 kV retrospective scan was triggered in the descending thoracic aorta at 111 HU's. Gantry rotation speed was 250 msecs and collimation was .6 mm. No beta blockade or nitro were given. The 3D data set was reconstructed in 5% intervals of the R-R cycle. Systolic and diastolic phases were analyzed on a dedicated work station using MPR, MIP and VRT modes. The patient received 80 cc of contrast. FINDINGS: Image quality: Excellent. Noise artifact is: Cardiac motion artifact noted. Millisecond reconstruction performed for TAVR analysis. Valve Morphology: The aortic valve is tricuspid with partial fusion of the NCC/RCC. The leaflets are diffusely calcified with bulky calcification noted on the [place]. All leaflets are severely restricted in systole. There is incomplete coaptation of the leaflets suggestive of at least moderate aortic regurgitation. Aortic Valve Calcium score: 3480 Aortic annular dimension: Phase assessed: 240 ms Annular area: 609 mm2 Annular perimeter: 89.2 mm Max diameter: 29.8 mm Min diameter: 26.4 mm Annular and subannular calcification: There is a single nodular calcification under the [place] that is mild. Optimal coplanar projection: LAO 10 CRA 6 Coronary Artery Height above Annulus: Left Main: 21.9 mm Right Coronary: 16.1 mm Sinus of Valsalva Measurements: Non-coronary: 42.5 mm Right-coronary: 37.3 mm Left-coronary: 42.4 mm Sinus of Valsalva Height: Non-coronary: 26.6 mm Right-coronary: 20.4 mm Left-coronary: 28.0 mm Sinotubular Junction: 34 mm Ascending Thoracic Aorta: 43 mm Coronary Arteries: Normal coronary origin. Right dominance. The study was performed without use of NTG and is insufficient for plaque evaluation. Severe 3-vessel calcifications noted. Cardiac Morphology: Right Atrium: Right atrial size is dilated. Right Ventricle: The right ventricular cavity is dilated. Left Atrium: Left atrial size is severely dilated. There is contrast mixing  artifact in the left atrial appendage. Left Ventricle: The ventricular cavity size is within normal limits. There are no stigmata of prior infarction. There is no abnormal filling defect. Pulmonary arteries: Normal in size without proximal filling defect. Pulmonary veins: Normal pulmonary venous drainage. Pericardium: Normal thickness with no significant effusion or calcium present. Mitral Valve: There is severe mitral annular calcification. Extra-cardiac findings: See attached radiology report for non-cardiac structures. IMPRESSION: 1. Tricuspid aortic valve with severe stenosis (calcium score 3480). 2. TAVR analysis performed at 240 ms due to cardiac motion artifact. 3. Annular measurements appropriate 29 mm Edwards S3 TAVR (609 mm2). 4. There is a single nodular calcification under the [place] that is mild. 5. Sufficient coronary to annulus distance. 6. Optimal Fluoroscopic Angle for Delivery: LAO 10 CRA 6 7. Contrast mixing artifact in the left  atrial appendage. Consider TEE to exclude LAA thrombus. 8. Mildly dilated aortic root (up to 42 mm). 9. Moderately dilated ascending aorta up to 43 mm measured using double-oblique technique. 10. Severe mitral annular calcification. [place] T. Audie Box, MD Electronically Signed: By: Eleonore Chiquito On: 10/18/2020 19:18   CT ANGIO CHEST AORTA W/CM & OR WO/CM  Result Date: 10/19/2020 CLINICAL DATA:  Aortic stenosis.  Pre-TAVR planning. EXAM: CT ANGIOGRAPHY CHEST, ABDOMEN AND PELVIS TECHNIQUE: Non-contrast CT of the chest was initially obtained. Multidetector CT imaging through the chest, abdomen and pelvis was performed using the standard protocol during bolus administration of intravenous contrast. Multiplanar reconstructed images and MIPs were obtained and reviewed to evaluate the vascular anatomy. CONTRAST:  32mL OMNIPAQUE IOHEXOL 350 MG/ML SOLN COMPARISON:  08/05/2027 CT angiogram of the abdomen and pelvis. 11/23/2018 chest CT. FINDINGS: CTA CHEST FINDINGS Cardiovascular:  Mild-to-moderate cardiomegaly. No significant pericardial effusion/thickening. Diffuse thickening and coarse calcification of the aortic valve. Left main and 3 vessel coronary atherosclerosis. Atherosclerotic thoracic aorta with 4.5 cm ascending thoracic aortic aneurysm. Top-normal caliber main pulmonary artery (3.2 cm diameter). No central pulmonary emboli. Mediastinum/Nodes: No discrete thyroid nodules. Unremarkable esophagus. No pathologically enlarged axillary, mediastinal or hilar lymph nodes. Lungs/Pleura: No pneumothorax. Small dependent bilateral pleural effusions, right greater than left. Mild centrilobular and paraseptal emphysema with diffuse bronchial wall thickening. Several solid pulmonary nodules scattered in the right greater than left lungs, largest 4 mm in the right lower lobe (series 5/image 86), all stable since 11/23/2018 chest CT and considered benign. Mild passive atelectasis in the dependent lower lobes. No acute consolidative airspace disease, lung masses or new significant pulmonary nodules. Musculoskeletal: No aggressive appearing focal osseous lesions. Moderate thoracic spondylosis. CTA ABDOMEN AND PELVIS FINDINGS Hepatobiliary: Normal liver with no liver mass. Contracted and otherwise normal gallbladder with no radiopaque cholelithiasis. No biliary ductal dilatation. Pancreas: Cystic 2.6 cm pancreatic body lesion (series 4/image 129), increased from 1.0 cm on 08/05/2019, with associated new pancreatic tail atrophy and duct dilation up to 5 mm diameter. Spleen: Normal size. No mass. Adrenals/Urinary Tract: Normal adrenals. No hydronephrosis. Simple 1.3 cm anterior upper right renal cyst. Several simple left renal cysts, largest 11.1 cm in the lower left kidney. Chronic mild diffuse bladder wall thickening and trabeculation with scattered small bladder diverticula, unchanged. Stomach/Bowel: Normal non-distended stomach. Normal caliber small bowel with no small bowel wall thickening. Normal  appendix. Mild sigmoid diverticulosis with no large bowel wall thickening or significant pericolonic fat stranding. Vascular/Lymphatic: Atherosclerotic abdominal aorta with ectatic 2.5 cm infrarenal abdominal aorta. No pathologically enlarged lymph nodes in the abdomen or pelvis. Reproductive: Moderate prostatomegaly. Other: No pneumoperitoneum, ascites or focal fluid collection. Musculoskeletal: No aggressive appearing focal osseous lesions. Lateral right eleventh rib fracture of uncertain chronicity, possibly acute. Moderate to marked lumbar degenerative disc disease, most prominent at L5-S1. VASCULAR MEASUREMENTS PERTINENT TO TAVR: AORTA: Minimal Aortic Diameter-16.2 x 16.0 mm Severity of Aortic Calcification-severe RIGHT PELVIS: Right Common Iliac Artery - Minimal Diameter-9.3 x 8.7 mm Tortuosity-moderate Calcification-moderate Right External Iliac Artery - Minimal Diameter-7.8 x 7.3 mm Tortuosity-moderate to severe Calcification-none Right Common Femoral Artery - Minimal Diameter-8.2 x 7.2 mm Tortuosity-mild Calcification-mild LEFT PELVIS: Left Common Iliac Artery - Minimal Diameter-9.3 x 7.1 mm Tortuosity-mild Calcification-severe Left External Iliac Artery - Minimal Diameter-8.7 x 7.5 mm Tortuosity-mild Calcification-mild Left Common Femoral Artery - Minimal Diameter-8.0 x 7.8 mm Tortuosity-mild Calcification-moderate Review of the MIP images confirms the above findings. IMPRESSION: 1. Vascular findings and measurements pertinent to potential TAVR procedure, as detailed.  2. Diffusely thickened and calcified aortic valve, compatible with reported history of aortic stenosis. 3. Mild-to-moderate cardiomegaly. Left main and 3 vessel coronary atherosclerosis. 4. Small dependent bilateral pleural effusions, right greater than left. 5. Mild centrilobular and paraseptal emphysema with diffuse bronchial wall thickening, suggesting COPD. 6. Cystic 2.6 cm pancreatic body lesion with associated new pancreatic tail  atrophy and duct dilation, increased since 08/05/2019 CT. 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 MRI abdomen without and with IV contrast is indicated for further characterization. 7. Mild sigmoid diverticulosis. 8. Moderate prostatomegaly. Chronic mild diffuse bladder wall thickening and trabeculation with scattered small bladder diverticula, suggesting chronic bladder outlet obstruction. 9. Lateral right eleventh rib fracture of uncertain chronicity, possibly acute. 10. Aortic Atherosclerosis (ICD10-I70.0) and Emphysema (ICD10-J43.9). Electronically Signed   By: Ilona Sorrel M.D.   On: 10/19/2020 07:31   ECHOCARDIOGRAM COMPLETE  Result Date: 10/17/2020    ECHOCARDIOGRAM REPORT   Patient Name:   Cesar [name] Date of Exam: 10/17/2020 Medical Rec #:  [id number]       Height:       73.0 in Accession #:    [id number]      Weight:       190.0 lb Date of Birth:  05-01-1929       BSA:          2.105 m Patient Age:    87 years        BP:           131/76 mmHg Patient Gender: M               HR:           68 bpm. Exam Location:  Inpatient Procedure: 2D Echo, Cardiac Doppler and Color Doppler Indications:    Aortic stenosis  History:        Patient has prior history of Echocardiogram examinations, most                 recent 07/31/2019. Arrythmias:Atrial Fibrillation; Risk                 Factors:Dyslipidemia.  Sonographer:    Clayton Lefort RDCS (AE) Referring Phys: [id number] [place]  1. Left ventricular ejection fraction, by estimation, is 50 to 55%. The left ventricle has low normal function. The left ventricle has no regional wall motion abnormalities. There is moderate left ventricular hypertrophy. Left ventricular diastolic parameters are indeterminate.  2. Right ventricular systolic function is mildly reduced. The right ventricular size is normal. There is normal pulmonary artery systolic pressure. The estimated right ventricular systolic pressure is [id number] mmHg.  3. Left atrial size was  severely dilated.  4. Right atrial size was severely dilated.  5. The mitral valve is abnormal. Mild mitral valve regurgitation. Severe mitral annular calcification.  6. The inferior vena cava is dilated in size with >50% respiratory variability, suggesting right atrial pressure of 8 mmHg.  7. The aortic valve is calcified. There is severe calcifcation of the aortic valve. Aortic valve regurgitation is mild to moderate. Severe aortic valve stenosis. Vmax 4.1 m/s, MG 37 mmHg, AVA 0.4 cm^2, DI 0.14 FINDINGS  Left Ventricle: Left ventricular ejection fraction, by estimation, is 50 to 55%. The left ventricle has low normal function. The left ventricle has no regional wall motion abnormalities. The left ventricular internal cavity size was normal in size. There is moderate left ventricular hypertrophy. Left ventricular diastolic parameters are indeterminate. Right Ventricle: The right ventricular size is normal.  Right vetricular wall thickness was not well visualized. Right ventricular systolic function is mildly reduced. There is normal pulmonary artery systolic pressure. The tricuspid regurgitant velocity is 2.27 m/s, and with an assumed right atrial pressure of 8 mmHg, the estimated right ventricular systolic pressure is [id number] mmHg. Left Atrium: Left atrial size was severely dilated. Right Atrium: Right atrial size was severely dilated. Pericardium: There is no evidence of pericardial effusion. Mitral Valve: The mitral valve is abnormal. Severe mitral annular calcification. Mild mitral valve regurgitation. Tricuspid Valve: The tricuspid valve is normal in structure. Tricuspid valve regurgitation is trivial. Aortic Valve: The aortic valve is calcified. There is severe calcifcation of the aortic valve. Aortic valve regurgitation is mild to moderate. Aortic regurgitation PHT measures 359 msec. Severe aortic stenosis is present. Aortic valve mean gradient measures 36.7 mmHg. Aortic valve peak gradient measures 61.7 mmHg.  Aortic valve area, by VTI measures 0.43 cm. Pulmonic Valve: The pulmonic valve was not well visualized. Pulmonic valve regurgitation is trivial. Aorta: The aortic root and ascending aorta are structurally normal, with no evidence of dilitation. Venous: The inferior vena cava is dilated in size with greater than 50% respiratory variability, suggesting right atrial pressure of 8 mmHg. IAS/Shunts: The interatrial septum was not well visualized.  LEFT VENTRICLE PLAX 2D LVIDd:         5.00 cm LVIDs:         3.60 cm LV PW:         1.60 cm LV IVS:        1.30 cm LVOT diam:     2.00 cm LV SV:         41 LV SV Index:   19 LVOT Area:     3.14 cm  RIGHT VENTRICLE            IVC RV Basal diam:  4.10 cm    IVC diam: 2.30 cm RV Mid diam:    2.60 cm RV S prime:     6.20 cm/s TAPSE (M-mode): 1.5 cm LEFT ATRIUM              Index        RIGHT ATRIUM           Index LA diam:        5.70 cm  2.71 cm/m   RA Area:     34.40 cm LA Vol (A2C):   228.0 ml 108.29 ml/m RA Volume:   113.00 ml 53.67 ml/m LA Vol (A4C):   240.0 ml 113.99 ml/m LA Biplane Vol: 235.0 ml 111.62 ml/m  AORTIC VALVE AV Area (Vmax):    0.44 cm AV Area (Vmean):   0.44 cm AV Area (VTI):     0.43 cm AV Vmax:           392.67 cm/s AV Vmean:          284.667 cm/s AV VTI:            0.951 m AV Peak Grad:      61.7 mmHg AV Mean Grad:      36.7 mmHg LVOT Vmax:         55.00 cm/s LVOT Vmean:        39.767 cm/s LVOT VTI:          0.130 m LVOT/AV VTI ratio: 0.14 AI PHT:            359 msec  AORTA Ao Root diam: 3.80 cm Ao Asc diam:  3.30 cm MR Peak  grad: 121.0 mmHg  TRICUSPID VALVE MR Mean grad: 72.0 mmHg   TR Peak grad:   20.6 mmHg MR Vmax:      550.00 cm/s TR Vmax:        227.00 cm/s MR Vmean:     393.0 cm/s                           SHUNTS                           Systemic VTI:  0.13 m                           Systemic Diam: 2.00 cm Epifanio Lesches MD Electronically signed by Epifanio Lesches MD Signature Date/Time: 10/17/2020/4:58:28 PM    Final    VAS US  CAROTID  Result Date: 10/18/2020 Carotid Arterial Duplex Study Indications:       Pre TAVR. Comparison Study:  no prior Performing Technologist: Blanch Media RVS  Examination Guidelines: A complete evaluation includes B-mode imaging, spectral Doppler, color Doppler, and power Doppler as needed of all accessible portions of each vessel. Bilateral testing is considered an integral part of a complete examination. Limited examinations for reoccurring indications may be performed as noted.  Right Carotid Findings: +----------+--------+--------+--------+------------------+--------+             PSV cm/s EDV cm/s Stenosis Plaque Description Comments  +----------+--------+--------+--------+------------------+--------+  CCA Prox   51       9                 heterogenous                 +----------+--------+--------+--------+------------------+--------+  CCA Distal 45       12                heterogenous                 +----------+--------+--------+--------+------------------+--------+  ICA Prox   81       16       1-39%    heterogenous                 +----------+--------+--------+--------+------------------+--------+  ICA Distal 56       13                                             +----------+--------+--------+--------+------------------+--------+  ECA        76       8                                              +----------+--------+--------+--------+------------------+--------+ +----------+--------+-------+--------+-------------------+             PSV cm/s EDV cms Describe Arm Pressure (mmHG)  +----------+--------+-------+--------+-------------------+  Subclavian 68                                             +----------+--------+-------+--------+-------------------+ +---------+--------+--------+--------------+  Vertebral PSV cm/s EDV cm/s Not identified  +---------+--------+--------+--------------+  Left Carotid Findings: +----------+--------+--------+--------+------------------+--------+  PSV cm/s EDV  cm/s Stenosis Plaque Description Comments  +----------+--------+--------+--------+------------------+--------+  CCA Prox   129      11                heterogenous                 +----------+--------+--------+--------+------------------+--------+  CCA Distal 49       9                 heterogenous                 +----------+--------+--------+--------+------------------+--------+  ICA Prox   60       10       1-39%    heterogenous                 +----------+--------+--------+--------+------------------+--------+  ICA Distal 78       17                                             +----------+--------+--------+--------+------------------+--------+  ECA        106      10                                             +----------+--------+--------+--------+------------------+--------+ +----------+--------+--------+--------+-------------------+             PSV cm/s EDV cm/s Describe Arm Pressure (mmHG)  +----------+--------+--------+--------+-------------------+  Subclavian 62                                              +----------+--------+--------+--------+-------------------+ +---------+--------+--+--------+--+---------+  Vertebral PSV cm/s 58 EDV cm/s 12 Antegrade  +---------+--------+--+--------+--+---------+   Summary: Right Carotid: Velocities in the right ICA are consistent with a 1-39% stenosis. Left Carotid: Velocities in the left ICA are consistent with a 1-39% stenosis. Vertebrals: Left vertebral artery demonstrates antegrade flow. Right vertebral             artery was not visualized. *See table(s) above for measurements and observations.     Preliminary    CT ANGIO ABDOMEN PELVIS  W &/OR WO CONTRAST  Result Date: 10/19/2020 CLINICAL DATA:  Aortic stenosis.  Pre-TAVR planning. EXAM: CT ANGIOGRAPHY CHEST, ABDOMEN AND PELVIS TECHNIQUE: Non-contrast CT of the chest was initially obtained. Multidetector CT imaging through the chest, abdomen and pelvis was performed using the standard protocol during bolus  administration of intravenous contrast. Multiplanar reconstructed images and MIPs were obtained and reviewed to evaluate the vascular anatomy. CONTRAST:  4mL OMNIPAQUE IOHEXOL 350 MG/ML SOLN COMPARISON:  08/05/2027 CT angiogram of the abdomen and pelvis. 11/23/2018 chest CT. FINDINGS: CTA CHEST FINDINGS Cardiovascular: Mild-to-moderate cardiomegaly. No significant pericardial effusion/thickening. Diffuse thickening and coarse calcification of the aortic valve. Left main and 3 vessel coronary atherosclerosis. Atherosclerotic thoracic aorta with 4.5 cm ascending thoracic aortic aneurysm. Top-normal caliber main pulmonary artery (3.2 cm diameter). No central pulmonary emboli. Mediastinum/Nodes: No discrete thyroid nodules. Unremarkable esophagus. No pathologically enlarged axillary, mediastinal or hilar lymph nodes. Lungs/Pleura: No pneumothorax. Small dependent bilateral pleural effusions, right greater than left. Mild centrilobular and paraseptal emphysema with diffuse bronchial wall thickening. Several solid pulmonary nodules scattered in the  right greater than left lungs, largest 4 mm in the right lower lobe (series 5/image 86), all stable since 11/23/2018 chest CT and considered benign. Mild passive atelectasis in the dependent lower lobes. No acute consolidative airspace disease, lung masses or new significant pulmonary nodules. Musculoskeletal: No aggressive appearing focal osseous lesions. Moderate thoracic spondylosis. CTA ABDOMEN AND PELVIS FINDINGS Hepatobiliary: Normal liver with no liver mass. Contracted and otherwise normal gallbladder with no radiopaque cholelithiasis. No biliary ductal dilatation. Pancreas: Cystic 2.6 cm pancreatic body lesion (series 4/image 129), increased from 1.0 cm on 08/05/2019, with associated new pancreatic tail atrophy and duct dilation up to 5 mm diameter. Spleen: Normal size. No mass. Adrenals/Urinary Tract: Normal adrenals. No hydronephrosis. Simple 1.3 cm anterior upper  right renal cyst. Several simple left renal cysts, largest 11.1 cm in the lower left kidney. Chronic mild diffuse bladder wall thickening and trabeculation with scattered small bladder diverticula, unchanged. Stomach/Bowel: Normal non-distended stomach. Normal caliber small bowel with no small bowel wall thickening. Normal appendix. Mild sigmoid diverticulosis with no large bowel wall thickening or significant pericolonic fat stranding. Vascular/Lymphatic: Atherosclerotic abdominal aorta with ectatic 2.5 cm infrarenal abdominal aorta. No pathologically enlarged lymph nodes in the abdomen or pelvis. Reproductive: Moderate prostatomegaly. Other: No pneumoperitoneum, ascites or focal fluid collection. Musculoskeletal: No aggressive appearing focal osseous lesions. Lateral right eleventh rib fracture of uncertain chronicity, possibly acute. Moderate to marked lumbar degenerative disc disease, most prominent at L5-S1. VASCULAR MEASUREMENTS PERTINENT TO TAVR: AORTA: Minimal Aortic Diameter-16.2 x 16.0 mm Severity of Aortic Calcification-severe RIGHT PELVIS: Right Common Iliac Artery - Minimal Diameter-9.3 x 8.7 mm Tortuosity-moderate Calcification-moderate Right External Iliac Artery - Minimal Diameter-7.8 x 7.3 mm Tortuosity-moderate to severe Calcification-none Right Common Femoral Artery - Minimal Diameter-8.2 x 7.2 mm Tortuosity-mild Calcification-mild LEFT PELVIS: Left Common Iliac Artery - Minimal Diameter-9.3 x 7.1 mm Tortuosity-mild Calcification-severe Left External Iliac Artery - Minimal Diameter-8.7 x 7.5 mm Tortuosity-mild Calcification-mild Left Common Femoral Artery - Minimal Diameter-8.0 x 7.8 mm Tortuosity-mild Calcification-moderate Review of the MIP images confirms the above findings. IMPRESSION: 1. Vascular findings and measurements pertinent to potential TAVR procedure, as detailed. 2. Diffusely thickened and calcified aortic valve, compatible with reported history of aortic stenosis. 3.  Mild-to-moderate cardiomegaly. Left main and 3 vessel coronary atherosclerosis. 4. Small dependent bilateral pleural effusions, right greater than left. 5. Mild centrilobular and paraseptal emphysema with diffuse bronchial wall thickening, suggesting COPD. 6. Cystic 2.6 cm pancreatic body lesion with associated new pancreatic tail atrophy and duct dilation, increased since 08/05/2019 CT. 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 MRI abdomen without and with IV contrast is indicated for further characterization. 7. Mild sigmoid diverticulosis. 8. Moderate prostatomegaly. Chronic mild diffuse bladder wall thickening and trabeculation with scattered small bladder diverticula, suggesting chronic bladder outlet obstruction. 9. Lateral right eleventh rib fracture of uncertain chronicity, possibly acute. 10. Aortic Atherosclerosis (ICD10-I70.0) and Emphysema (ICD10-J43.9). Electronically Signed   By: Ilona Sorrel M.D.   On: 10/19/2020 07:31    Cardiac Studies   See above  Patient Profile     87 y.o. male with moderte AS, admitted with syncopal episode  Assessment & Plan    1.  Syncope:   No significant arrhythmias to explain his episode of syncope.  his AS is now severe. This is likely the cause of his syncope  - Appreciate structural team evaluation - plan cath on Monday, has had gated CT scans   2.  Aortic stenosis:   Severe AS now. This is likely the etiology  of his syncope. - cath on Monday with plans for probable TAVR next week.  3. Atrial fib:  Rate is controlled. Eliquis on hold for cath, on heparin gtts.  4.  Hyperlipidemia :   Stable. Continue atorvastatin 80 mg nightly.   For questions or updates, please contact [place] Please consult [email or web address] for contact info under   Pixie Casino, MD, FACC, [place] Director of the [facility] Diplomate of the American Board of  Clinical Lipidology Attending Cardiologist  Direct Dial: [phone]   Fax: [phone]  Website:  [email or web address]  Pixie Casino, MD  10/19/2020, 9:25 AM

## 2020-10-19 NOTE — Progress Notes (Signed)
RN oncoming agrees with previous charting from this shift

## 2020-10-19 NOTE — Progress Notes (Signed)
Physical Therapy Evaluation/Pree TAVR  Patient Details Name: PETRO TALENT MRN: [id number] DOB: [birth year] Today's Date: 10/19/2020   History of Present Illness  KYLAN LIBERATI is a 87 y.o. male with medical history significant of moderate to severe aortic stenosis, chronic A. fib on Eliquis, seizure disorder (seems diagnosed in 2021, has been on Depakote since), CKD stage III, aortic aneurysm, OSA not on CPAP, HLD, presented with syncope. He is now set to have a TAVR procedure. He  Clinical Impression  10/19/2020 PT TAVR Pre-Assessment  HClinical Impression Statement: Pt is a male y.o. being assessed for pre-TAVR.  Pt reports symptoms of syncope with activity.  Pt has limited strength, limited active shoulder flexion otherwise normal ROM, and decreased balance.  Pt ambulated 167 ft during the 6 minute walk test requiring 2 rest breaks with max HR of 88, lowest O2 sat 94, BP 138/76  during mobility.  5 meter walk test produced an average gait speed of 15.67  which indicates decreased gait speed.  RPE was 3 and dyspnea was 2 during mobility.  Pt was limited by fatigue.  Pt's frailty rating was  which is considered 2.  Pt would benefit from continued PT in the acute care setting due to the above listed deficits in balance, strength, ROM, endurance and activity tolerance.    General UE/LE Strength and ROM:  Strength (0-5/5) ROM (limited/full)  R UE 5 full  L UE 5 full  R LE 5 full  L LE 5 full    6 Minute Walk Test:   Total Distance Walked:183ft.    Did the pt need a rest break? Yes If yes, why? Pain:No; Fatigue:Yes; Dyspnea/O2 saturations: Yes Comments:    Pre-Test Post-Test  BP 134/74 138/80  HR 80 88  O2 saturations (indicated RA or L/min [place]) 95 94  Modified Borg Dyspnea Scale (0 none-10 maximal) 0 2  RPE (6 very light-10 very hard) 6 8  Comments:   5 Meter Walk Test:  Trial 1 15 seconds  Trial 2 16 seconds  Trial 3 18 seconds  3 Trial Average/Gait Speed 15.67 seconds/  ft/sec (<1.8 ft/sec indicates high fall risk)  Comments:   Clinical Frailty Scale (1 very fit - 9 terminally ill): 2 (>/= 3/9 is considered frail)   Carolyne Littles PT DPT                Follow Up Recommendations Home health PT (May have different disposition following surgery)    Equipment Recommendations  Rolling walker with 5" wheels    Recommendations for Other Services       Precautions / Restrictions Precautions Precautions: None Restrictions Weight Bearing Restrictions: No      Mobility  Bed Mobility Overal bed mobility: Modified Independent                  Transfers Overall transfer level: Needs assistance Equipment used: Standard walker Transfers: Sit to/from Stand Sit to Stand: Min assist         General transfer comment: LOB posteriorly requiring min A to regain balance  Ambulation/Gait Ambulation/Gait assistance: Min assist;Mod assist Gait Distance (Feet): 167 Feet (59 feet before seated rest break then ale to ambualte another 108)         General Gait Details: slow but steady gait pattern. One standing rest break and one seated rest break required  Stairs            Wheelchair Mobility    Modified Rankin (Stroke Patients Only)  Balance Overall balance assessment: Needs assistance   Sitting balance-Leahy Scale: Fair     Standing balance support: Single extremity supported Standing balance-Leahy Scale: Poor                               Pertinent Vitals/Pain Pain Assessment: No/denies pain    Home Living Family/patient expects to be discharged to:: Private residence Living Arrangements: Spouse/significant other Available Help at Discharge: Family;Available 24 hours/day Type of Home: House Home Access: Stairs to enter Entrance Stairs-Rails: Right Entrance Stairs-Number of Steps: 2 Home Layout: Two level;Able to live on main level with bedroom/bathroom Home Equipment: Gilford Rile - 2  wheels;Bedside commode;Cane - single point      Prior Function Level of Independence: Independent with assistive device(s)               Hand Dominance   Dominant Hand: Left    Extremity/Trunk Assessment        Lower Extremity Assessment Lower Extremity Assessment: Generalized weakness       Communication   Communication: HOH  Cognition Arousal/Alertness: Awake/alert Behavior During Therapy: Flat affect Overall Cognitive Status: Within Functional Limits for tasks assessed                                        General Comments      Exercises     Assessment/Plan    PT Assessment Patient needs continued PT services  PT Problem List Decreased strength;Decreased mobility;Decreased safety awareness;Decreased coordination;Decreased activity tolerance;Decreased balance;Decreased knowledge of use of DME       PT Treatment Interventions Therapeutic exercise;DME instruction;Gait training;Balance training;Stair training;Functional mobility training;Therapeutic activities;Patient/family education    PT Goals (Current goals can be found in the Care Plan section)  Acute Rehab PT Goals Patient Stated Goal: to go home PT Goal Formulation: With patient/family Time For Goal Achievement: 10/31/20 Potential to Achieve Goals: Good    Frequency Min 3X/week   Barriers to discharge        Co-evaluation               AM-PAC PT "6 Clicks" Mobility  Outcome Measure                  End of Session Equipment Utilized During Treatment: Gait belt Activity Tolerance: Patient tolerated treatment well Patient left: in bed;with call bell/phone within reach;with bed alarm set Nurse Communication: Mobility status PT Visit Diagnosis: Unsteadiness on feet (R26.81);Other abnormalities of gait and mobility (R26.89);Muscle weakness (generalized) (M62.81)    Time: 4098-1191 PT Time Calculation (min) (ACUTE ONLY): 35 min   Charges:   PT Evaluation $PT  Eval Moderate Complexity: 1 Mod           Carney Living PT DPT  10/19/2020, 1:48 PM

## 2020-10-20 DIAGNOSIS — I4821 Permanent atrial fibrillation: Secondary | ICD-10-CM

## 2020-10-20 DIAGNOSIS — I350 Nonrheumatic aortic (valve) stenosis: Secondary | ICD-10-CM

## 2020-10-20 DIAGNOSIS — R55 Syncope and collapse: Secondary | ICD-10-CM | POA: Diagnosis not present

## 2020-10-20 LAB — BASIC METABOLIC PANEL
Anion gap: 11 (ref 5–15)
BUN: 27 mg/dL — ABNORMAL HIGH (ref 8–23)
CO2: 22 mmol/L (ref 22–32)
Calcium: 8.7 mg/dL — ABNORMAL LOW (ref 8.9–10.3)
Chloride: 102 mmol/L (ref 98–111)
Creatinine, Ser: 1.49 mg/dL — ABNORMAL HIGH (ref 0.61–1.24)
GFR, Estimated: 45 mL/min — ABNORMAL LOW (ref >60)
Glucose, Bld: 102 mg/dL — ABNORMAL HIGH (ref 70–99)
Potassium: 4.4 mmol/L (ref 3.5–5.1)
Sodium: 135 mmol/L (ref 135–145)

## 2020-10-20 LAB — CBC
HCT: 41.5 % (ref 39.0–52.0)
Hemoglobin: 13.6 g/dL (ref 13.0–17.0)
MCH: 31.7 pg (ref 26.0–34.0)
MCHC: 32.8 g/dL (ref 30.0–36.0)
MCV: 96.7 fL (ref 80.0–100.0)
Platelets: 131 10*3/uL — ABNORMAL LOW (ref 150–400)
RBC: 4.29 MIL/uL (ref 4.22–5.81)
RDW: 13.2 % (ref 11.5–15.5)
WBC: 5.9 10*3/uL (ref 4.0–10.5)
nRBC: 0 % (ref 0.0–0.2)

## 2020-10-20 LAB — HEPARIN LEVEL (UNFRACTIONATED): Heparin Unfractionated: 0.41 IU/mL (ref 0.30–0.70)

## 2020-10-20 LAB — GLUCOSE, CAPILLARY: Glucose-Capillary: 106 mg/dL — ABNORMAL HIGH (ref 70–99)

## 2020-10-20 MED ORDER — SODIUM CHLORIDE 0.9 % IV SOLN: INTRAVENOUS | Status: DC

## 2020-10-20 MED ORDER — SODIUM CHLORIDE 0.9% FLUSH: 3.0000 mL | INTRAVENOUS | Status: DC | PRN

## 2020-10-20 MED ORDER — ASPIRIN 81 MG PO CHEW
81.0000 mg | CHEWABLE_TABLET | ORAL | Status: AC
  Administered 2020-10-21: 81 mg via ORAL
  Filled 2020-10-20: qty 1

## 2020-10-20 MED ORDER — SODIUM CHLORIDE 0.9 % IV SOLN: 250.0000 mL | INTRAVENOUS | Status: DC | PRN

## 2020-10-20 NOTE — H&P (View-Only) (Signed)
Progress Note  Patient Name: Cesar [name] Date of Encounter: 10/20/2020  [facility] Cardiologist: [name], MD   Subjective   No events overnight. Plan for [facility] tomorrow morning. Vitals stable. Labs stable on IV heparin.  Inpatient Medications    Scheduled Meds:  atorvastatin  80 mg Oral QHS   divalproex  500 mg Oral QHS   finasteride  5 mg Oral Daily   sodium bicarbonate  650 mg Oral BID   sodium chloride flush  3 mL Intravenous Q12H   sodium chloride flush  3 mL Intravenous Q12H   Continuous Infusions:  heparin 1,000 Units/hr (10/20/20 0725)   PRN Meds: acetaminophen **OR** acetaminophen, hydrALAZINE, ondansetron **OR** ondansetron (ZOFRAN) IV   Vital Signs    Vitals:   10/19/20 1609 10/19/20 2049 10/20/20 0211 10/20/20 0423  BP: 134/72 106/60  (!) 148/80  Pulse: 64 66  65  Resp: 20 18  16   Temp: 98.7 F (37.1 C) 98.5 F (36.9 C)  97.9 F (36.6 C)  TempSrc: Oral Oral  Oral  SpO2: 96% 96%  99%  Weight:   83.3 kg   Height:        Intake/Output Summary (Last 24 hours) at 10/20/2020 0742 Last data filed at 10/20/2020 0442 Gross per 24 hour  Intake 977.64 ml  Output 950 ml  Net 27.64 ml   Last 3 Weights 10/20/2020 10/19/2020 10/18/2020  Weight (lbs) 183 lb 11.2 oz 183 lb 1.6 oz 182 lb 3.2 oz  Weight (kg) 83.326 kg 83.054 kg 82.645 kg      Telemetry    afib with controlled V reponse - Personally Reviewed  ECG    N/A  Physical Exam    Physical Exam: Blood pressure (!) 148/80, pulse 65, temperature 97.9 F (36.6 C), temperature source Oral, resp. rate 16, height 6\' 1"  (1.854 m), weight 83.3 kg, SpO2 99 %.  GEN:  Well nourished, well developed in no acute distress HEENT: Normal NECK: No JVD; No carotid bruits LYMPHATICS: No lymphadenopathy CARDIAC: irregl. Irreg. 3/6 systolic murmur  RESPIRATORY:  Clear to auscultation without rales, wheezing or rhonchi  ABDOMEN: Soft, non-tender, non-distended MUSCULOSKELETAL:  No edema; No  deformity  SKIN: Warm and dry NEUROLOGIC:  Alert and oriented x 3, hard of hearing    Labs    High Sensitivity Troponin:  No results for input(s): TROPONINIHS in the last 720 hours.    Chemistry Recent Labs  Lab 10/16/20 1000 10/17/20 0304 10/19/20 0324 10/20/20 0337  NA 139 139 137 135  K 4.7 4.2 4.5 4.4  CL 108 106 104 102  CO2 19* 22 24 22   GLUCOSE 108* 92 99 102*  BUN 25* 22 24* 27*  CREATININE 1.46* 1.43* 1.43* 1.49*  CALCIUM 9.1 8.6* 8.7* 8.7*  PROT 6.6  --   --   --   ALBUMIN 3.4*  --   --   --   AST 40  --   --   --   ALT 29  --   --   --   ALKPHOS 61  --   --   --   BILITOT 1.3*  --   --   --   GFRNONAA 46* 47* 47* 45*  ANIONGAP 12 11 9 11      Hematology Recent Labs  Lab 10/16/20 1000 10/17/20 0304 10/20/20 0337  WBC 6.0 6.0 5.9  RBC 3.99* 3.73* 4.29  HGB 12.7* 11.7* 13.6  HCT 39.8 37.0* 41.5  MCV 99.7 99.2 96.7  MCH 31.8 31.4 31.7  MCHC 31.9 31.6 32.8  RDW 13.4 13.4 13.2  PLT 139* 121* 131*    BNPNo results for input(s): BNP, PROBNP in the last 168 hours.   DDimer No results for input(s): DDIMER in the last 168 hours.   Radiology    CT CORONARY MORPH W/CTA COR W/SCORE W/CA W/CM &/OR WO/CM  Addendum Date: 10/19/2020   ADDENDUM REPORT: 10/19/2020 07:35 EXAM: OVER-READ INTERPRETATION  CT CHEST The following report is an over-read performed by radiologist Dr. Samara Snide [facility] [place] on 10/19/2020. This over-read does not include interpretation of cardiac or coronary anatomy or pathology. The coronary CTA interpretation by the cardiologist is attached. COMPARISON:  11/23/2018 chest CT. FINDINGS: Please see the separate concurrent chest CT angiogram report for details. IMPRESSION: Please see the separate concurrent chest CT angiogram report for details. Electronically Signed   By: Ilona Sorrel M.D.   On: 10/19/2020 07:35   Result Date: 10/19/2020 CLINICAL DATA:  Severe Aortic Stenosis. EXAM: Cardiac TAVR CT TECHNIQUE: The patient was  scanned on a [employer]. A 100 kV retrospective scan was triggered in the descending thoracic aorta at 111 HU's. Gantry rotation speed was 250 msecs and collimation was .6 mm. No beta blockade or nitro were given. The 3D data set was reconstructed in 5% intervals of the R-R cycle. Systolic and diastolic phases were analyzed on a dedicated work station using MPR, MIP and VRT modes. The patient received 80 cc of contrast. FINDINGS: Image quality: Excellent. Noise artifact is: Cardiac motion artifact noted. Millisecond reconstruction performed for TAVR analysis. Valve Morphology: The aortic valve is tricuspid with partial fusion of the NCC/RCC. The leaflets are diffusely calcified with bulky calcification noted on the [place]. All leaflets are severely restricted in systole. There is incomplete coaptation of the leaflets suggestive of at least moderate aortic regurgitation. Aortic Valve Calcium score: 3480 Aortic annular dimension: Phase assessed: 240 ms Annular area: 609 mm2 Annular perimeter: 89.2 mm Max diameter: 29.8 mm Min diameter: 26.4 mm Annular and subannular calcification: There is a single nodular calcification under the [place] that is mild. Optimal coplanar projection: LAO 10 CRA 6 Coronary Artery Height above Annulus: Left Main: 21.9 mm Right Coronary: 16.1 mm Sinus of Valsalva Measurements: Non-coronary: 42.5 mm Right-coronary: 37.3 mm Left-coronary: 42.4 mm Sinus of Valsalva Height: Non-coronary: 26.6 mm Right-coronary: 20.4 mm Left-coronary: 28.0 mm Sinotubular Junction: 34 mm Ascending Thoracic Aorta: 43 mm Coronary Arteries: Normal coronary origin. Right dominance. The study was performed without use of NTG and is insufficient for plaque evaluation. Severe 3-vessel calcifications noted. Cardiac Morphology: Right Atrium: Right atrial size is dilated. Right Ventricle: The right ventricular cavity is dilated. Left Atrium: Left atrial size is severely dilated. There is contrast mixing artifact in the  left atrial appendage. Left Ventricle: The ventricular cavity size is within normal limits. There are no stigmata of prior infarction. There is no abnormal filling defect. Pulmonary arteries: Normal in size without proximal filling defect. Pulmonary veins: Normal pulmonary venous drainage. Pericardium: Normal thickness with no significant effusion or calcium present. Mitral Valve: There is severe mitral annular calcification. Extra-cardiac findings: See attached radiology report for non-cardiac structures. IMPRESSION: 1. Tricuspid aortic valve with severe stenosis (calcium score 3480). 2. TAVR analysis performed at 240 ms due to cardiac motion artifact. 3. Annular measurements appropriate 29 mm Edwards S3 TAVR (609 mm2). 4. There is a single nodular calcification under the [place] that is mild. 5. Sufficient coronary to annulus distance. 6. Optimal Fluoroscopic  Angle for Delivery: LAO 10 CRA 6 7. Contrast mixing artifact in the left atrial appendage. Consider TEE to exclude LAA thrombus. 8. Mildly dilated aortic root (up to 42 mm). 9. Moderately dilated ascending aorta up to 43 mm measured using double-oblique technique. 10. Severe mitral annular calcification. [place] T. Audie Box, MD Electronically Signed: By: Eleonore Chiquito On: 10/18/2020 19:18   CT ANGIO CHEST AORTA W/CM & OR WO/CM  Result Date: 10/19/2020 CLINICAL DATA:  Aortic stenosis.  Pre-TAVR planning. EXAM: CT ANGIOGRAPHY CHEST, ABDOMEN AND PELVIS TECHNIQUE: Non-contrast CT of the chest was initially obtained. Multidetector CT imaging through the chest, abdomen and pelvis was performed using the standard protocol during bolus administration of intravenous contrast. Multiplanar reconstructed images and MIPs were obtained and reviewed to evaluate the vascular anatomy. CONTRAST:  87mL OMNIPAQUE IOHEXOL 350 MG/ML SOLN COMPARISON:  08/05/2027 CT angiogram of the abdomen and pelvis. 11/23/2018 chest CT. FINDINGS: CTA CHEST FINDINGS Cardiovascular: Mild-to-moderate  cardiomegaly. No significant pericardial effusion/thickening. Diffuse thickening and coarse calcification of the aortic valve. Left main and 3 vessel coronary atherosclerosis. Atherosclerotic thoracic aorta with 4.5 cm ascending thoracic aortic aneurysm. Top-normal caliber main pulmonary artery (3.2 cm diameter). No central pulmonary emboli. Mediastinum/Nodes: No discrete thyroid nodules. Unremarkable esophagus. No pathologically enlarged axillary, mediastinal or hilar lymph nodes. Lungs/Pleura: No pneumothorax. Small dependent bilateral pleural effusions, right greater than left. Mild centrilobular and paraseptal emphysema with diffuse bronchial wall thickening. Several solid pulmonary nodules scattered in the right greater than left lungs, largest 4 mm in the right lower lobe (series 5/image 86), all stable since 11/23/2018 chest CT and considered benign. Mild passive atelectasis in the dependent lower lobes. No acute consolidative airspace disease, lung masses or new significant pulmonary nodules. Musculoskeletal: No aggressive appearing focal osseous lesions. Moderate thoracic spondylosis. CTA ABDOMEN AND PELVIS FINDINGS Hepatobiliary: Normal liver with no liver mass. Contracted and otherwise normal gallbladder with no radiopaque cholelithiasis. No biliary ductal dilatation. Pancreas: Cystic 2.6 cm pancreatic body lesion (series 4/image 129), increased from 1.0 cm on 08/05/2019, with associated new pancreatic tail atrophy and duct dilation up to 5 mm diameter. Spleen: Normal size. No mass. Adrenals/Urinary Tract: Normal adrenals. No hydronephrosis. Simple 1.3 cm anterior upper right renal cyst. Several simple left renal cysts, largest 11.1 cm in the lower left kidney. Chronic mild diffuse bladder wall thickening and trabeculation with scattered small bladder diverticula, unchanged. Stomach/Bowel: Normal non-distended stomach. Normal caliber small bowel with no small bowel wall thickening. Normal appendix. Mild  sigmoid diverticulosis with no large bowel wall thickening or significant pericolonic fat stranding. Vascular/Lymphatic: Atherosclerotic abdominal aorta with ectatic 2.5 cm infrarenal abdominal aorta. No pathologically enlarged lymph nodes in the abdomen or pelvis. Reproductive: Moderate prostatomegaly. Other: No pneumoperitoneum, ascites or focal fluid collection. Musculoskeletal: No aggressive appearing focal osseous lesions. Lateral right eleventh rib fracture of uncertain chronicity, possibly acute. Moderate to marked lumbar degenerative disc disease, most prominent at L5-S1. VASCULAR MEASUREMENTS PERTINENT TO TAVR: AORTA: Minimal Aortic Diameter-16.2 x 16.0 mm Severity of Aortic Calcification-severe RIGHT PELVIS: Right Common Iliac Artery - Minimal Diameter-9.3 x 8.7 mm Tortuosity-moderate Calcification-moderate Right External Iliac Artery - Minimal Diameter-7.8 x 7.3 mm Tortuosity-moderate to severe Calcification-none Right Common Femoral Artery - Minimal Diameter-8.2 x 7.2 mm Tortuosity-mild Calcification-mild LEFT PELVIS: Left Common Iliac Artery - Minimal Diameter-9.3 x 7.1 mm Tortuosity-mild Calcification-severe Left External Iliac Artery - Minimal Diameter-8.7 x 7.5 mm Tortuosity-mild Calcification-mild Left Common Femoral Artery - Minimal Diameter-8.0 x 7.8 mm Tortuosity-mild Calcification-moderate Review of the MIP images confirms the above  findings. IMPRESSION: 1. Vascular findings and measurements pertinent to potential TAVR procedure, as detailed. 2. Diffusely thickened and calcified aortic valve, compatible with reported history of aortic stenosis. 3. Mild-to-moderate cardiomegaly. Left main and 3 vessel coronary atherosclerosis. 4. Small dependent bilateral pleural effusions, right greater than left. 5. Mild centrilobular and paraseptal emphysema with diffuse bronchial wall thickening, suggesting COPD. 6. Cystic 2.6 cm pancreatic body lesion with associated new pancreatic tail atrophy and duct  dilation, increased since 08/05/2019 CT. 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 MRI abdomen without and with IV contrast is indicated for further characterization. 7. Mild sigmoid diverticulosis. 8. Moderate prostatomegaly. Chronic mild diffuse bladder wall thickening and trabeculation with scattered small bladder diverticula, suggesting chronic bladder outlet obstruction. 9. Lateral right eleventh rib fracture of uncertain chronicity, possibly acute. 10. Aortic Atherosclerosis (ICD10-I70.0) and Emphysema (ICD10-J43.9). Electronically Signed   By: Ilona Sorrel M.D.   On: 10/19/2020 07:31   VAS US CAROTID  Result Date: 10/18/2020 Carotid Arterial Duplex Study Indications:       Pre TAVR. Comparison Study:  no prior Performing Technologist: Abram Sander RVS  Examination Guidelines: A complete evaluation includes B-mode imaging, spectral Doppler, color Doppler, and power Doppler as needed of all accessible portions of each vessel. Bilateral testing is considered an integral part of a complete examination. Limited examinations for reoccurring indications may be performed as noted.  Right Carotid Findings: +----------+--------+--------+--------+------------------+--------+             PSV cm/s EDV cm/s Stenosis Plaque Description Comments  +----------+--------+--------+--------+------------------+--------+  CCA Prox   51       9                 heterogenous                 +----------+--------+--------+--------+------------------+--------+  CCA Distal 45       12                heterogenous                 +----------+--------+--------+--------+------------------+--------+  ICA Prox   81       16       1-39%    heterogenous                 +----------+--------+--------+--------+------------------+--------+  ICA Distal 56       13                                             +----------+--------+--------+--------+------------------+--------+  ECA        76       8                                               +----------+--------+--------+--------+------------------+--------+ +----------+--------+-------+--------+-------------------+             PSV cm/s EDV cms Describe Arm Pressure (mmHG)  +----------+--------+-------+--------+-------------------+  Subclavian 68                                             +----------+--------+-------+--------+-------------------+ +---------+--------+--------+--------------+  Vertebral PSV cm/s EDV cm/s Not identified  +---------+--------+--------+--------------+  Left Carotid  Findings: +----------+--------+--------+--------+------------------+--------+             PSV cm/s EDV cm/s Stenosis Plaque Description Comments  +----------+--------+--------+--------+------------------+--------+  CCA Prox   129      11                heterogenous                 +----------+--------+--------+--------+------------------+--------+  CCA Distal 49       9                 heterogenous                 +----------+--------+--------+--------+------------------+--------+  ICA Prox   60       10       1-39%    heterogenous                 +----------+--------+--------+--------+------------------+--------+  ICA Distal 78       17                                             +----------+--------+--------+--------+------------------+--------+  ECA        106      10                                             +----------+--------+--------+--------+------------------+--------+ +----------+--------+--------+--------+-------------------+             PSV cm/s EDV cm/s Describe Arm Pressure (mmHG)  +----------+--------+--------+--------+-------------------+  Subclavian 62                                              +----------+--------+--------+--------+-------------------+ +---------+--------+--+--------+--+---------+  Vertebral PSV cm/s 58 EDV cm/s 12 Antegrade  +---------+--------+--+--------+--+---------+   Summary: Right Carotid: Velocities in the right ICA are consistent with a 1-39% stenosis. Left  Carotid: Velocities in the left ICA are consistent with a 1-39% stenosis. Vertebrals: Left vertebral artery demonstrates antegrade flow. Right vertebral             artery was not visualized. *See table(s) above for measurements and observations.     Preliminary    CT ANGIO ABDOMEN PELVIS  W &/OR WO CONTRAST  Result Date: 10/19/2020 CLINICAL DATA:  Aortic stenosis.  Pre-TAVR planning. EXAM: CT ANGIOGRAPHY CHEST, ABDOMEN AND PELVIS TECHNIQUE: Non-contrast CT of the chest was initially obtained. Multidetector CT imaging through the chest, abdomen and pelvis was performed using the standard protocol during bolus administration of intravenous contrast. Multiplanar reconstructed images and MIPs were obtained and reviewed to evaluate the vascular anatomy. CONTRAST:  18mL OMNIPAQUE IOHEXOL 350 MG/ML SOLN COMPARISON:  08/05/2027 CT angiogram of the abdomen and pelvis. 11/23/2018 chest CT. FINDINGS: CTA CHEST FINDINGS Cardiovascular: Mild-to-moderate cardiomegaly. No significant pericardial effusion/thickening. Diffuse thickening and coarse calcification of the aortic valve. Left main and 3 vessel coronary atherosclerosis. Atherosclerotic thoracic aorta with 4.5 cm ascending thoracic aortic aneurysm. Top-normal caliber main pulmonary artery (3.2 cm diameter). No central pulmonary emboli. Mediastinum/Nodes: No discrete thyroid nodules. Unremarkable esophagus. No pathologically enlarged axillary, mediastinal or hilar lymph nodes. Lungs/Pleura: No pneumothorax. Small dependent bilateral pleural effusions, right greater than left. Mild centrilobular and paraseptal  emphysema with diffuse bronchial wall thickening. Several solid pulmonary nodules scattered in the right greater than left lungs, largest 4 mm in the right lower lobe (series 5/image 86), all stable since 11/23/2018 chest CT and considered benign. Mild passive atelectasis in the dependent lower lobes. No acute consolidative airspace disease, lung masses or new  significant pulmonary nodules. Musculoskeletal: No aggressive appearing focal osseous lesions. Moderate thoracic spondylosis. CTA ABDOMEN AND PELVIS FINDINGS Hepatobiliary: Normal liver with no liver mass. Contracted and otherwise normal gallbladder with no radiopaque cholelithiasis. No biliary ductal dilatation. Pancreas: Cystic 2.6 cm pancreatic body lesion (series 4/image 129), increased from 1.0 cm on 08/05/2019, with associated new pancreatic tail atrophy and duct dilation up to 5 mm diameter. Spleen: Normal size. No mass. Adrenals/Urinary Tract: Normal adrenals. No hydronephrosis. Simple 1.3 cm anterior upper right renal cyst. Several simple left renal cysts, largest 11.1 cm in the lower left kidney. Chronic mild diffuse bladder wall thickening and trabeculation with scattered small bladder diverticula, unchanged. Stomach/Bowel: Normal non-distended stomach. Normal caliber small bowel with no small bowel wall thickening. Normal appendix. Mild sigmoid diverticulosis with no large bowel wall thickening or significant pericolonic fat stranding. Vascular/Lymphatic: Atherosclerotic abdominal aorta with ectatic 2.5 cm infrarenal abdominal aorta. No pathologically enlarged lymph nodes in the abdomen or pelvis. Reproductive: Moderate prostatomegaly. Other: No pneumoperitoneum, ascites or focal fluid collection. Musculoskeletal: No aggressive appearing focal osseous lesions. Lateral right eleventh rib fracture of uncertain chronicity, possibly acute. Moderate to marked lumbar degenerative disc disease, most prominent at L5-S1. VASCULAR MEASUREMENTS PERTINENT TO TAVR: AORTA: Minimal Aortic Diameter-16.2 x 16.0 mm Severity of Aortic Calcification-severe RIGHT PELVIS: Right Common Iliac Artery - Minimal Diameter-9.3 x 8.7 mm Tortuosity-moderate Calcification-moderate Right External Iliac Artery - Minimal Diameter-7.8 x 7.3 mm Tortuosity-moderate to severe Calcification-none Right Common Femoral Artery - Minimal  Diameter-8.2 x 7.2 mm Tortuosity-mild Calcification-mild LEFT PELVIS: Left Common Iliac Artery - Minimal Diameter-9.3 x 7.1 mm Tortuosity-mild Calcification-severe Left External Iliac Artery - Minimal Diameter-8.7 x 7.5 mm Tortuosity-mild Calcification-mild Left Common Femoral Artery - Minimal Diameter-8.0 x 7.8 mm Tortuosity-mild Calcification-moderate Review of the MIP images confirms the above findings. IMPRESSION: 1. Vascular findings and measurements pertinent to potential TAVR procedure, as detailed. 2. Diffusely thickened and calcified aortic valve, compatible with reported history of aortic stenosis. 3. Mild-to-moderate cardiomegaly. Left main and 3 vessel coronary atherosclerosis. 4. Small dependent bilateral pleural effusions, right greater than left. 5. Mild centrilobular and paraseptal emphysema with diffuse bronchial wall thickening, suggesting COPD. 6. Cystic 2.6 cm pancreatic body lesion with associated new pancreatic tail atrophy and duct dilation, increased since 08/05/2019 CT. 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 MRI abdomen without and with IV contrast is indicated for further characterization. 7. Mild sigmoid diverticulosis. 8. Moderate prostatomegaly. Chronic mild diffuse bladder wall thickening and trabeculation with scattered small bladder diverticula, suggesting chronic bladder outlet obstruction. 9. Lateral right eleventh rib fracture of uncertain chronicity, possibly acute. 10. Aortic Atherosclerosis (ICD10-I70.0) and Emphysema (ICD10-J43.9). Electronically Signed   By: Ilona Sorrel M.D.   On: 10/19/2020 07:31    Cardiac Studies   See above  Patient Profile     87 y.o. male with moderte AS, admitted with syncopal episode  Assessment & Plan    1.  Syncope:   No significant arrhythmias to explain his episode of syncope.  his AS is now severe. This is likely the cause of his syncope  - Appreciate structural team evaluation - plan cath on Monday, has had gated  CT scans  2.  Aortic stenosis:   Severe AS now. This is likely the etiology of his syncope. - cath on Monday with plans for probable TAVR next week.  3. Atrial fib:  Rate is controlled. Eliquis on hold for cath, on heparin gtts.  4.  Hyperlipidemia :   Stable. Continue atorvastatin 80 mg nightly.   For questions or updates, please contact [place] Please consult [email or web address] for contact info under   Pixie Casino, MD, FACC, [place] Director of the [facility] Diplomate of the American Board of Clinical Lipidology Attending Cardiologist  Direct Dial: [phone]   Fax: [phone]  Website:  [email or web address]  Pixie Casino, MD  10/20/2020, 7:42 AM

## 2020-10-20 NOTE — Progress Notes (Signed)
[address]       [place] [address]             [phone]          CARDIOTHORACIC SURGERY CONSULTATION REPORT  PCP is No primary care provider on file. 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 [name], abdominal aortic aneurysm, stage III chronic kidney disease, hyperlipidemia, and obstructive sleep apnea who was admitted to the hospital following a syncopal event and has been referred for surgical consultation to discuss treatment options for management of critical aortic stenosis.  Patient has long history of permanent atrial fibrillation for which she has been followed by Dr. [name]. He has had multiple embolic strokes in the past and remains anticoagulated using Eliquis. Previous echocardiograms have documented the presence of normal left ventricular systolic function with aortic stenosis that has slowly progressed over time. Most recent follow-up echocardiogram performed October 2020 revealed findings consistent with moderate aortic stenosis with mean transvalvular gradient estimated 26 mmHg and aortic valve area calculated 0.99 cm by VTI. The patient also has had moderate fusiform aneurysmal enlargement of the thoracic aorta followed by Dr. [name] with maximum transverse diameter measured 4.5 cm by CT angiogram.  Patient states that over the last few months he has had problems with exertional shortness of breath. Immediately prior to hospital admission he was working on a tractor in his garage and changing the Marketing executive when he suddenly passed out. He woke up on the floor and was able to get himself  back to the house in a golf cart. Symptoms were not preceded by chest discomfort or shortness of breath. He did have a brief episode of chest pain in the emergency room after hospital admission. He describes stable symptoms of exertional shortness of breath over the last few months. He has not had resting shortness of breath, PND, orthopnea, or lower extremity edema. The patient is married and [place]. He has 2 adult children and several grandchildren. He has remained functionally independent throughout retirement. He drives an automobile and continues to do chores around the house. He does admit that his balance is quite poor since his most recent stroke and he ambulates using a cane. He has not had any previous mechanical falls. 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   History reviewed. No pertinent past medical history.  History reviewed. No pertinent surgical history.  Family History  Problem Relation Age of Onset  . Heart failure Mother   . Diabetes Father   . Stroke Father     Social History   Socioeconomic History  . Marital status: Married    Spouse name: Not on file  . Number of children: Not on file  . Years of education: Not on file  . Highest education level: Not on file  Occupational History  . Not on file  Tobacco Use  . Smoking status: Not on file  . Smokeless tobacco: Not on file  Substance and Sexual Activity  . Alcohol use: Not on file  . Drug use: Not on file  . Sexual activity: Not on file  Other Topics Concern  . Not on file  Social History Narrative  . Not on file   Social Determinants of Health   Financial Resource Strain: Not on file  Food Insecurity: Not on file  Transportation Needs: Not on file  Physical Activity: Not on file  Stress: Not on file  Social Connections: Not on file  Intimate Partner Violence: Not on file    Prior to Admission medications    Medication Sig Start Date End Date Taking? Authorizing Provider  apixaban (ELIQUIS) 5 MG TABS tablet Take 5 mg by mouth 2 (two) times daily.   Yes [provider]  atorvastatin (LIPITOR) 80 MG tablet Take 80 mg by mouth daily.   Yes [provider]  divalproex (DEPAKOTE ER) 500 MG 24 hr tablet Take 500 mg by mouth at bedtime. 10/05/20  Yes [provider]  finasteride (PROSCAR) 5 MG tablet Take 5 mg by mouth daily.   Yes [provider]    Current Facility-Administered Medications  Medication Dose Route Frequency Provider Last Rate Last Admin  . acetaminophen (TYLENOL) tablet 650 mg  650 mg Oral Q6H PRN [name], MD       Or  . acetaminophen (TYLENOL) suppository 650 mg  650 mg Rectal Q6H PRN [name], MD      . atorvastatin (LIPITOR) tablet 80 mg  80 mg Oral QHS Pixie Casino, MD      . divalproex (DEPAKOTE ER) 24 hr tablet 500 mg  500 mg Oral QHS [name], MD   500 mg at 10/19/20 2048  . finasteride (PROSCAR) tablet 5 mg  5 mg Oral Daily [name], MD   5 mg at 10/20/20 0837  . heparin ADULT infusion 100 units/mL (25000 units/255mL)  1,000 Units/hr Intravenous Continuous Lyndee Leo, RPH 10 mL/hr at 10/20/20 0725 1,000 Units/hr at 10/20/20 0725  . hydrALAZINE (APRESOLINE) tablet 25 mg  25 mg Oral Q6H PRN [name], MD      . ondansetron [facility]) tablet 4 mg  4 mg Oral Q6H PRN [name], MD       Or  . ondansetron [facility]) injection 4 mg  4 mg Intravenous Q6H PRN [name], MD      . sodium bicarbonate tablet 650 mg  650 mg Oral BID [name], MD   650 mg at 10/20/20 0837  . sodium chloride flush (NS) 0.9 % injection 3 mL  3 mL Intravenous Q12H [name], MD   3 mL at 10/20/20 0837  . sodium chloride flush (NS) 0.9 % injection 3 mL  3 mL Intravenous Q12H Dunn, Dayna N, PA-C   3 mL at 10/19/20 2051    No Known Allergies    Review of Systems:   General:  normal appetite, decreased energy, no weight gain,  no weight loss, no fever  Cardiac:  no chest pain with exertion, no chest pain at rest, +SOB with exertion, no resting SOB, no PND, no orthopnea, no palpitations, + arrhythmia, + atrial fibrillation, no LE edema, no dizzy spells, + syncope  Respiratory:  + exertional shortness of breath, no home oxygen, no productive cough, no dry cough, no bronchitis, no wheezing, no hemoptysis, no asthma, no pain with inspiration or cough, no sleep apnea, no CPAP at night  GI:   no difficulty swallowing, no reflux, no frequent heartburn, no hiatal hernia, no abdominal pain, no constipation, no diarrhea, no hematochezia, no hematemesis, no melena  GU:   no dysuria,  no frequency, no urinary tract infection, no hematuria, no enlarged prostate, no kidney stones, + kidney disease  Vascular:  no pain suggestive of claudication, no pain in feet, no leg cramps, no varicose veins, no DVT, no non-healing foot ulcer  Neuro:   + stroke, no TIA's, no seizures, no headaches, no temporary blindness one eye,  no slurred speech, no peripheral neuropathy, no chronic pain, + significant instability of gait, no memory/cognitive dysfunction  Musculoskeletal: no arthritis, no joint swelling, no myalgias, no difficulty walking, somewhat decreased mobility   Skin:   no rash, no itching, no skin infections, no pressure sores or ulcerations  Psych:   no anxiety, no depression, no nervousness, no unusual recent stress  Eyes:   no blurry vision, no floaters, no recent vision changes, + wears glasses or contacts  ENT:   + hearing loss, no loose or painful teeth  Hematologic:  + easy bruising, no abnormal bleeding, no clotting disorder, no frequent epistaxis  Endocrine:  no diabetes, does not check CBG's at home     Physical Exam:   BP (!) 148/80 (BP Location: Left Arm)   Pulse 65   Temp 97.9 F (36.6 C) (Oral)   Resp 16   Ht 6\' 1"  (1.854 m)   Wt 83.3 kg   SpO2 99%   BMI 24.24 kg/m   General:  Elderly, somewhat  frail-appearing  HEENT:  Unremarkable   Neck:   no JVD, no bruits, no adenopathy   Chest:   clear to auscultation, symmetrical breath sounds, no wheezes, no rhonchi   CV:   Irregular rate and rhythm w/ prominent grade IV/VI crescendo/decrescendo systolic murmur   Abdomen:  soft, non-tender, no masses   Extremities:  warm, well-perfused, pulses palpabld, no lower extremity edema  Rectal/GU  Deferred  Neuro:   Grossly non-focal and symmetrical throughout  Skin:   Clean and dry, no rashes, no breakdown  Diagnostic Tests:  Lab Results: Recent Labs    10/20/20 0337  WBC 5.9  HGB 13.6  HCT 41.5  PLT 131*   BMET:  Recent Labs    10/19/20 0324 10/20/20 0337  NA 137 135  K 4.5 4.4  CL 104 102  CO2 24 22  GLUCOSE 99 102*  BUN 24* 27*  CREATININE 1.43* 1.49*  CALCIUM 8.7* 8.7*    CBG (last 3)  Recent Labs    10/18/20 0601 10/19/20 0504 10/20/20 0358  GLUCAP 108* 99 106*   PT/INR:  No results for input(s): LABPROT, INR in the last 72 hours.  CXR:  CHEST - 2 VIEW  COMPARISON:  August 07, 2019.  FINDINGS: Stable cardiomegaly. No pneumothorax is noted. Mild bibasilar atelectasis is noted with small pleural effusions. Bony thorax is unremarkable.  IMPRESSION: Mild bibasilar atelectasis with small pleural effusions.   Electronically Signed   By: Marijo Conception M.D.   On: 10/16/2020 14:28   EKG: Atrial fibrillation w/ no significant AV conduction delay    ECHOCARDIOGRAM REPORT       Patient Name:  Cesar [name] Date of Exam: 10/17/2020  Medical Rec #: [id number]    Height:    73.0 in  Accession #:  [id number]   Weight:    190.0 lb  Date of Birth: [birth year]    BSA:     2.105 m  Patient Age:  61 years    BP:      131/76 mmHg  Patient Gender: M  HR:      68 bpm.  Exam Location: Inpatient   Procedure: 2D Echo, Cardiac Doppler and Color Doppler   Indications:  Aortic stenosis    History:     Patient has prior history of Echocardiogram examinations,  most         recent 07/31/2019. Arrythmias:Atrial Fibrillation; Risk         Factors:Dyslipidemia.    Sonographer:  Clayton Lefort RDCS (AE)  Referring Phys: [id number] [place]    1. Left ventricular ejection fraction, by estimation, is 50 to 55%. The  left ventricle has low normal function. The left ventricle has no regional  wall motion abnormalities. There is moderate left ventricular hypertrophy.  Left ventricular diastolic  parameters are indeterminate.  2. Right ventricular systolic function is mildly reduced. The right  ventricular size is normal. There is normal pulmonary artery systolic  pressure. The estimated right ventricular systolic pressure is [id number] mmHg.  3. Left atrial size was severely dilated.  4. Right atrial size was severely dilated.  5. The mitral valve is abnormal. Mild mitral valve regurgitation. Severe  mitral annular calcification.  6. The inferior vena cava is dilated in size with >50% respiratory  variability, suggesting right atrial pressure of 8 mmHg.  7. The aortic valve is calcified. There is severe calcifcation of the  aortic valve. Aortic valve regurgitation is mild to moderate. Severe  aortic valve stenosis. Vmax 4.1 m/s, MG 37 mmHg, AVA 0.4 cm^2, DI 0.14   FINDINGS  Left Ventricle: Left ventricular ejection fraction, by estimation, is 50  to 55%. The left ventricle has low normal function. The left ventricle has  no regional wall motion abnormalities. The left ventricular internal  cavity size was normal in size.  There is moderate left ventricular hypertrophy. Left ventricular diastolic  parameters are indeterminate.   Right Ventricle: The right ventricular size is normal. Right vetricular  wall thickness was not well visualized. Right ventricular systolic  function is mildly reduced. There is normal pulmonary artery systolic  pressure.  The tricuspid regurgitant velocity  is 2.27 m/s, and with an assumed right atrial pressure of 8 mmHg, the  estimated right ventricular systolic pressure is [id number] mmHg.   Left Atrium: Left atrial size was severely dilated.   Right Atrium: Right atrial size was severely dilated.   Pericardium: There is no evidence of pericardial effusion.   Mitral Valve: The mitral valve is abnormal. Severe mitral annular  calcification. Mild mitral valve regurgitation.   Tricuspid Valve: The tricuspid valve is normal in structure. Tricuspid  valve regurgitation is trivial.   Aortic Valve: The aortic valve is calcified. There is severe calcifcation  of the aortic valve. Aortic valve regurgitation is mild to moderate.  Aortic regurgitation PHT measures 359 msec. Severe aortic stenosis is  present. Aortic valve mean gradient  measures 36.7 mmHg. Aortic valve peak gradient measures 61.7 mmHg. Aortic  valve area, by VTI measures 0.43 cm.   Pulmonic Valve: The pulmonic valve was not well visualized. Pulmonic valve  regurgitation is trivial.   Aorta: The aortic root and ascending aorta are structurally normal, with  no evidence of dilitation.   Venous: The inferior vena cava is dilated in size with greater than 50%  respiratory variability, suggesting right atrial pressure of 8 mmHg.   IAS/Shunts: The interatrial septum was not well visualized.     LEFT VENTRICLE  PLAX 2D  LVIDd:     5.00 cm  LVIDs:  3.60 cm  LV PW:     1.60 cm  LV IVS:    1.30 cm  LVOT diam:   2.00 cm  LV SV:     41  LV SV Index:  19  LVOT Area:   3.14 cm     RIGHT VENTRICLE      IVC  RV Basal diam: 4.10 cm  IVC diam: 2.30 cm  RV Mid diam:  2.60 cm  RV S prime:   6.20 cm/s  TAPSE (M-mode): 1.5 cm   LEFT ATRIUM       Index    RIGHT ATRIUM      Index  LA diam:    5.70 cm 2.71 cm/m  RA Area:   34.40 cm  LA Vol (A2C):  228.0 ml 108.29 ml/m RA Volume:   113.00 ml 53.67 ml/m  LA Vol (A4C):  240.0 ml 113.99 ml/m  LA Biplane Vol: 235.0 ml 111.62 ml/m  AORTIC VALVE  AV Area (Vmax):  0.44 cm  AV Area (Vmean):  0.44 cm  AV Area (VTI):   0.43 cm  AV Vmax:      392.67 cm/s  AV Vmean:     284.667 cm/s  AV VTI:      0.951 m  AV Peak Grad:   61.7 mmHg  AV Mean Grad:   36.7 mmHg  LVOT Vmax:     55.00 cm/s  LVOT Vmean:    39.767 cm/s  LVOT VTI:     0.130 m  LVOT/AV VTI ratio: 0.14  AI PHT:      359 msec    AORTA  Ao Root diam: 3.80 cm  Ao Asc diam: 3.30 cm   MR Peak grad: 121.0 mmHg TRICUSPID VALVE  MR Mean grad: 72.0 mmHg  TR Peak grad:  20.6 mmHg  MR Vmax:   550.00 cm/s TR Vmax:    227.00 cm/s  MR Vmean:   393.0 cm/s              SHUNTS              Systemic VTI: 0.13 m              Systemic Diam: 2.00 cm   Oswaldo Milian MD  Electronically signed by Oswaldo Milian MD  Signature Date/Time: 10/17/2020/4:58:28 PM      Cardiac TAVR CT  TECHNIQUE: The patient was scanned on a [employer]. A 100 kV retrospective scan was triggered in the descending thoracic aorta at 111 HU's. Gantry rotation speed was 250 msecs and collimation was .6 mm. No beta blockade or nitro were given. The 3D data set was reconstructed in 5% intervals of the R-R cycle. Systolic and diastolic phases were analyzed on a dedicated work station using MPR, MIP and VRT modes. The patient received 80 cc of contrast.  FINDINGS: Image quality: Excellent.  Noise artifact is: Cardiac motion artifact noted. Millisecond reconstruction performed for TAVR analysis.  Valve Morphology: The aortic valve is tricuspid with partial fusion of the NCC/RCC. The leaflets are diffusely calcified with bulky calcification noted on the [place]. All leaflets are severely restricted in systole. There is incomplete coaptation of the leaflets suggestive  of at least moderate aortic regurgitation.  Aortic Valve Calcium score: 3480  Aortic annular dimension:  Phase assessed: 240 ms  Annular area: 609 mm2  Annular perimeter: 89.2 mm  Max diameter: 29.8 mm  Min diameter: 26.4 mm  Annular and subannular calcification: There is a single nodular calcification  under the [facility] that is mild.  Optimal coplanar projection: LAO 10 CRA 6  Coronary Artery Height above Annulus:  Left Main: 21.9 mm  Right Coronary: 16.1 mm  Sinus of Valsalva Measurements:  Non-coronary: 42.5 mm  Right-coronary: 37.3 mm  Left-coronary: 42.4 mm  Sinus of Valsalva Height:  Non-coronary: 26.6 mm  Right-coronary: 20.4 mm  Left-coronary: 28.0 mm  Sinotubular Junction: 34 mm  Ascending Thoracic Aorta: 43 mm  Coronary Arteries: Normal coronary origin. Right dominance. The study was performed without use of NTG and is insufficient for plaque evaluation. Severe 3-vessel calcifications noted.  Cardiac Morphology:  Right Atrium: Right atrial size is dilated.  Right Ventricle: The right ventricular cavity is dilated.  Left Atrium: Left atrial size is severely dilated. There is contrast mixing artifact in the left atrial appendage.  Left Ventricle: The ventricular cavity size is within normal limits. There are no stigmata of prior infarction. There is no abnormal filling defect.  Pulmonary arteries: Normal in size without proximal filling defect.  Pulmonary veins: Normal pulmonary venous drainage.  Pericardium: Normal thickness with no significant effusion or calcium present.  Mitral Valve: There is severe mitral annular calcification.  Extra-cardiac findings: See attached radiology report for non-cardiac structures.  IMPRESSION: 1. Tricuspid aortic valve with severe stenosis (calcium score 3480).  2. TAVR analysis performed at 240 ms due to cardiac motion artifact.  3. Annular measurements  appropriate 29 mm Edwards S3 TAVR (609 mm2).  4. There is a single nodular calcification under the [place] that is mild.  5. Sufficient coronary to annulus distance.  6. Optimal Fluoroscopic Angle for Delivery: LAO 10 CRA 6  7. Contrast mixing artifact in the left atrial appendage. Consider TEE to exclude LAA thrombus.  8. Mildly dilated aortic root (up to 42 mm).  9. Moderately dilated ascending aorta up to 43 mm measured using double-oblique technique.  10. Severe mitral annular calcification.  [place] T. Audie Box, MD  Electronically Signed: By: Eleonore Chiquito On: 10/18/2020 19:18    CT ANGIOGRAPHY CHEST, ABDOMEN AND PELVIS  TECHNIQUE: Non-contrast CT of the chest was initially obtained.  Multidetector CT imaging through the chest, abdomen and pelvis was performed using the standard protocol during bolus administration of intravenous contrast. Multiplanar reconstructed images and MIPs were obtained and reviewed to evaluate the vascular anatomy.  CONTRAST:  74mL OMNIPAQUE IOHEXOL 350 MG/ML SOLN  COMPARISON:  08/05/2027 CT angiogram of the abdomen and pelvis. 11/23/2018 chest CT.  FINDINGS: CTA CHEST FINDINGS  Cardiovascular: Mild-to-moderate cardiomegaly. No significant pericardial effusion/thickening. Diffuse thickening and coarse calcification of the aortic valve. Left main and 3 vessel coronary atherosclerosis. Atherosclerotic thoracic aorta with 4.5 cm ascending thoracic aortic aneurysm. Top-normal caliber main pulmonary artery (3.2 cm diameter). No central pulmonary emboli.  Mediastinum/Nodes: No discrete thyroid nodules. Unremarkable esophagus. No pathologically enlarged axillary, mediastinal or hilar lymph nodes.  Lungs/Pleura: No pneumothorax. Small dependent bilateral pleural effusions, right greater than left. Mild centrilobular and paraseptal emphysema with diffuse bronchial wall thickening. Several solid pulmonary nodules scattered in  the right greater than left lungs, largest 4 mm in the right lower lobe (series 5/image 86), all stable since 11/23/2018 chest CT and considered benign. Mild passive atelectasis in the dependent lower lobes. No acute consolidative airspace disease, lung masses or new significant pulmonary nodules.  Musculoskeletal: No aggressive appearing focal osseous lesions. Moderate thoracic spondylosis.  CTA ABDOMEN AND PELVIS FINDINGS  Hepatobiliary: Normal liver with no liver mass. Contracted and otherwise normal gallbladder with no radiopaque cholelithiasis. No biliary  ductal dilatation.  Pancreas: Cystic 2.6 cm pancreatic body lesion (series 4/image 129), increased from 1.0 cm on 08/05/2019, with associated new pancreatic tail atrophy and duct dilation up to 5 mm diameter.  Spleen: Normal size. No mass.  Adrenals/Urinary Tract: Normal adrenals. No hydronephrosis. Simple 1.3 cm anterior upper right renal cyst. Several simple left renal cysts, largest 11.1 cm in the lower left kidney. Chronic mild diffuse bladder wall thickening and trabeculation with scattered small bladder diverticula, unchanged.  Stomach/Bowel: Normal non-distended stomach. Normal caliber small bowel with no small bowel wall thickening. Normal appendix. Mild sigmoid diverticulosis with no large bowel wall thickening or significant pericolonic fat stranding.  Vascular/Lymphatic: Atherosclerotic abdominal aorta with ectatic 2.5 cm infrarenal abdominal aorta. No pathologically enlarged lymph nodes in the abdomen or pelvis.  Reproductive: Moderate prostatomegaly.  Other: No pneumoperitoneum, ascites or focal fluid collection.  Musculoskeletal: No aggressive appearing focal osseous lesions. Lateral right eleventh rib fracture of uncertain chronicity, possibly acute. Moderate to marked lumbar degenerative disc disease, most prominent at L5-S1.  VASCULAR MEASUREMENTS PERTINENT TO  TAVR:  AORTA:  Minimal Aortic Diameter-16.2 x 16.0 mm  Severity of Aortic Calcification-severe  RIGHT PELVIS:  Right Common Iliac Artery -  Minimal Diameter-9.3 x 8.7 mm  Tortuosity-moderate  Calcification-moderate  Right External Iliac Artery -  Minimal Diameter-7.8 x 7.3 mm  Tortuosity-moderate to severe  Calcification-none  Right Common Femoral Artery -  Minimal Diameter-8.2 x 7.2 mm  Tortuosity-mild  Calcification-mild  LEFT PELVIS:  Left Common Iliac Artery -  Minimal Diameter-9.3 x 7.1 mm  Tortuosity-mild  Calcification-severe  Left External Iliac Artery -  Minimal Diameter-8.7 x 7.5 mm  Tortuosity-mild  Calcification-mild  Left Common Femoral Artery -  Minimal Diameter-8.0 x 7.8 mm  Tortuosity-mild  Calcification-moderate  Review of the MIP images confirms the above findings.  IMPRESSION: 1. Vascular findings and measurements pertinent to potential TAVR procedure, as detailed. 2. Diffusely thickened and calcified aortic valve, compatible with reported history of aortic stenosis. 3. Mild-to-moderate cardiomegaly. Left main and 3 vessel coronary atherosclerosis. 4. Small dependent bilateral pleural effusions, right greater than left. 5. Mild centrilobular and paraseptal emphysema with diffuse bronchial wall thickening, suggesting COPD. 6. Cystic 2.6 cm pancreatic body lesion with associated new pancreatic tail atrophy and duct dilation, increased since 08/05/2019 CT. 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 MRI abdomen without and with IV contrast is indicated for further characterization. 7. Mild sigmoid diverticulosis. 8. Moderate prostatomegaly. Chronic mild diffuse bladder wall thickening and trabeculation with scattered small bladder diverticula, suggesting chronic bladder outlet obstruction. 9. Lateral right eleventh rib fracture of uncertain chronicity, possibly acute. 10.  Aortic Atherosclerosis (ICD10-I70.0) and Emphysema (ICD10-J43.9).   Electronically Signed   By: Ilona Sorrel M.D.   On: 10/19/2020 07:31     Impression:  Patient has stage D1 severe symptomatic aortic stenosis. He describes a several month history of symptoms of exertional shortness of breath and worsening fatigue consistent with chronic diastolic congestive heart failure, New York Heart Association functional class II. He was hospitalized following a frank syncopal episode that occurred while the patient was working in his garage.  I have personally reviewed the patient's recent transthoracic echocardiogram, EKG, and CT angiograms. Diagnostic cardiac catheterization remains pending at this time. Echocardiogram reveals low normal left ventricular systolic function with ejection fraction estimated 50 to 55%. This is in contrast to the patient's last previous follow-up echocardiogram performed October 2020 at which time systolic function was entirely normal. The aortic valve is trileaflet with severe thickening,  calcification, and extremely severely restricted leaflet mobility involving all 3 leaflets of the aortic valve. There is severe aortic stenosis. Peak velocity across the aortic valve measured a size 4.1 m/s corresponding to mean transvalvular gradient estimated 37 mmHg and aortic valve area calculated only 0.43 cm by VTI. The DVI was extremely low at 0.14. There is also moderate aortic insufficiency. Baseline EKG reveals atrial fibrillation without significant AV conduction delay.  Cardiac-gated CTA of the heart reveals anatomical characteristics consistent with aortic stenosis suitable for treatment by transcatheter aortic valve replacement without any significant complicating features and CTA of the aorta and iliac vessels demonstrate what appears to be adequate pelvic vascular access to facilitate a transfemoral approach. 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  We await results from diagnostic cardiac catheterization planned for tomorrow. In the absence of critical coronary artery disease I would favor proceeding with transcatheter aortic valve replacement during this hospitalization.    Plan:  The patient was counseled at length regarding treatment alternatives for management of severe symptomatic aortic stenosis. Alternative approaches such as conventional aortic valve replacement, transcatheter aortic valve replacement, and continued medical therapy without intervention were compared and contrasted at length.  The risks associated with conventional surgical aortic valve replacement were discussed in detail, as were expectations for post-operative convalescence, and why I would be extremely reluctant to consider this patient a candidate for conventional surgery.  Issues specific to transcatheter aortic valve replacement were discussed including questions about long term valve durability, the potential for paravalvular leak, possible increased risk of need for permanent pacemaker placement, and other technical complications related to the procedure itself.  Long-term prognosis with medical therapy was discussed. This discussion was placed in the context of the patient's own specific clinical presentation and past medical history.  All of his questions have been addressed.  The patient hopes to proceed with transcatheter aortic valve replacement pending results from his diagnostic cardiac catheterization.     I spent in excess of 90 minutes during the conduct of this hospital consultation and >50% of this time involved direct face-to-face encounter for counseling and/or coordination of the patient's care.    Valentina Gu. Roxy Manns, MD 10/20/2020 12:01 PM

## 2020-10-20 NOTE — Progress Notes (Signed)
Progress Note  Patient Name: Cesar [name] Date of Encounter: 10/20/2020  [facility] Cardiologist: [name], MD   Subjective   No events overnight. Plan for [facility] tomorrow morning. Vitals stable. Labs stable on IV heparin.  Inpatient Medications    Scheduled Meds:  atorvastatin  80 mg Oral QHS   divalproex  500 mg Oral QHS   finasteride  5 mg Oral Daily   sodium bicarbonate  650 mg Oral BID   sodium chloride flush  3 mL Intravenous Q12H   sodium chloride flush  3 mL Intravenous Q12H   Continuous Infusions:  heparin 1,000 Units/hr (10/20/20 0725)   PRN Meds: acetaminophen **OR** acetaminophen, hydrALAZINE, ondansetron **OR** ondansetron (ZOFRAN) IV   Vital Signs    Vitals:   10/19/20 1609 10/19/20 2049 10/20/20 0211 10/20/20 0423  BP: 134/72 106/60  (!) 148/80  Pulse: 64 66  65  Resp: 20 18  16   Temp: 98.7 F (37.1 C) 98.5 F (36.9 C)  97.9 F (36.6 C)  TempSrc: Oral Oral  Oral  SpO2: 96% 96%  99%  Weight:   83.3 kg   Height:        Intake/Output Summary (Last 24 hours) at 10/20/2020 0742 Last data filed at 10/20/2020 0442 Gross per 24 hour  Intake 977.64 ml  Output 950 ml  Net 27.64 ml   Last 3 Weights 10/20/2020 10/19/2020 10/18/2020  Weight (lbs) 183 lb 11.2 oz 183 lb 1.6 oz 182 lb 3.2 oz  Weight (kg) 83.326 kg 83.054 kg 82.645 kg      Telemetry    afib with controlled V reponse - Personally Reviewed  ECG    N/A  Physical Exam    Physical Exam: Blood pressure (!) 148/80, pulse 65, temperature 97.9 F (36.6 C), temperature source Oral, resp. rate 16, height 6\' 1"  (1.854 m), weight 83.3 kg, SpO2 99 %.  GEN:  Well nourished, well developed in no acute distress HEENT: Normal NECK: No JVD; No carotid bruits LYMPHATICS: No lymphadenopathy CARDIAC: irregl. Irreg. 3/6 systolic murmur  RESPIRATORY:  Clear to auscultation without rales, wheezing or rhonchi  ABDOMEN: Soft, non-tender, non-distended MUSCULOSKELETAL:  No edema; No  deformity  SKIN: Warm and dry NEUROLOGIC:  Alert and oriented x 3, hard of hearing    Labs    High Sensitivity Troponin:  No results for input(s): TROPONINIHS in the last 720 hours.    Chemistry Recent Labs  Lab 10/16/20 1000 10/17/20 0304 10/19/20 0324 10/20/20 0337  NA 139 139 137 135  K 4.7 4.2 4.5 4.4  CL 108 106 104 102  CO2 19* 22 24 22   GLUCOSE 108* 92 99 102*  BUN 25* 22 24* 27*  CREATININE 1.46* 1.43* 1.43* 1.49*  CALCIUM 9.1 8.6* 8.7* 8.7*  PROT 6.6  --   --   --   ALBUMIN 3.4*  --   --   --   AST 40  --   --   --   ALT 29  --   --   --   ALKPHOS 61  --   --   --   BILITOT 1.3*  --   --   --   GFRNONAA 46* 47* 47* 45*  ANIONGAP 12 11 9 11      Hematology Recent Labs  Lab 10/16/20 1000 10/17/20 0304 10/20/20 0337  WBC 6.0 6.0 5.9  RBC 3.99* 3.73* 4.29  HGB 12.7* 11.7* 13.6  HCT 39.8 37.0* 41.5  MCV 99.7 99.2 96.7  MCH 31.8 31.4 31.7  MCHC 31.9 31.6 32.8  RDW 13.4 13.4 13.2  PLT 139* 121* 131*    BNPNo results for input(s): BNP, PROBNP in the last 168 hours.   DDimer No results for input(s): DDIMER in the last 168 hours.   Radiology    CT CORONARY MORPH W/CTA COR W/SCORE W/CA W/CM &/OR WO/CM  Addendum Date: 10/19/2020   ADDENDUM REPORT: 10/19/2020 07:35 EXAM: OVER-READ INTERPRETATION  CT CHEST The following report is an over-read performed by radiologist Dr. Samara Snide [facility] [place] on 10/19/2020. This over-read does not include interpretation of cardiac or coronary anatomy or pathology. The coronary CTA interpretation by the cardiologist is attached. COMPARISON:  11/23/2018 chest CT. FINDINGS: Please see the separate concurrent chest CT angiogram report for details. IMPRESSION: Please see the separate concurrent chest CT angiogram report for details. Electronically Signed   By: Ilona Sorrel M.D.   On: 10/19/2020 07:35   Result Date: 10/19/2020 CLINICAL DATA:  Severe Aortic Stenosis. EXAM: Cardiac TAVR CT TECHNIQUE: The patient was  scanned on a [employer]. A 100 kV retrospective scan was triggered in the descending thoracic aorta at 111 HU's. Gantry rotation speed was 250 msecs and collimation was .6 mm. No beta blockade or nitro were given. The 3D data set was reconstructed in 5% intervals of the R-R cycle. Systolic and diastolic phases were analyzed on a dedicated work station using MPR, MIP and VRT modes. The patient received 80 cc of contrast. FINDINGS: Image quality: Excellent. Noise artifact is: Cardiac motion artifact noted. Millisecond reconstruction performed for TAVR analysis. Valve Morphology: The aortic valve is tricuspid with partial fusion of the NCC/RCC. The leaflets are diffusely calcified with bulky calcification noted on the [place]. All leaflets are severely restricted in systole. There is incomplete coaptation of the leaflets suggestive of at least moderate aortic regurgitation. Aortic Valve Calcium score: 3480 Aortic annular dimension: Phase assessed: 240 ms Annular area: 609 mm2 Annular perimeter: 89.2 mm Max diameter: 29.8 mm Min diameter: 26.4 mm Annular and subannular calcification: There is a single nodular calcification under the [place] that is mild. Optimal coplanar projection: LAO 10 CRA 6 Coronary Artery Height above Annulus: Left Main: 21.9 mm Right Coronary: 16.1 mm Sinus of Valsalva Measurements: Non-coronary: 42.5 mm Right-coronary: 37.3 mm Left-coronary: 42.4 mm Sinus of Valsalva Height: Non-coronary: 26.6 mm Right-coronary: 20.4 mm Left-coronary: 28.0 mm Sinotubular Junction: 34 mm Ascending Thoracic Aorta: 43 mm Coronary Arteries: Normal coronary origin. Right dominance. The study was performed without use of NTG and is insufficient for plaque evaluation. Severe 3-vessel calcifications noted. Cardiac Morphology: Right Atrium: Right atrial size is dilated. Right Ventricle: The right ventricular cavity is dilated. Left Atrium: Left atrial size is severely dilated. There is contrast mixing artifact in the  left atrial appendage. Left Ventricle: The ventricular cavity size is within normal limits. There are no stigmata of prior infarction. There is no abnormal filling defect. Pulmonary arteries: Normal in size without proximal filling defect. Pulmonary veins: Normal pulmonary venous drainage. Pericardium: Normal thickness with no significant effusion or calcium present. Mitral Valve: There is severe mitral annular calcification. Extra-cardiac findings: See attached radiology report for non-cardiac structures. IMPRESSION: 1. Tricuspid aortic valve with severe stenosis (calcium score 3480). 2. TAVR analysis performed at 240 ms due to cardiac motion artifact. 3. Annular measurements appropriate 29 mm Edwards S3 TAVR (609 mm2). 4. There is a single nodular calcification under the [place] that is mild. 5. Sufficient coronary to annulus distance. 6. Optimal Fluoroscopic  Angle for Delivery: LAO 10 CRA 6 7. Contrast mixing artifact in the left atrial appendage. Consider TEE to exclude LAA thrombus. 8. Mildly dilated aortic root (up to 42 mm). 9. Moderately dilated ascending aorta up to 43 mm measured using double-oblique technique. 10. Severe mitral annular calcification. [place] T. Audie Box, MD Electronically Signed: By: Eleonore Chiquito On: 10/18/2020 19:18   CT ANGIO CHEST AORTA W/CM & OR WO/CM  Result Date: 10/19/2020 CLINICAL DATA:  Aortic stenosis.  Pre-TAVR planning. EXAM: CT ANGIOGRAPHY CHEST, ABDOMEN AND PELVIS TECHNIQUE: Non-contrast CT of the chest was initially obtained. Multidetector CT imaging through the chest, abdomen and pelvis was performed using the standard protocol during bolus administration of intravenous contrast. Multiplanar reconstructed images and MIPs were obtained and reviewed to evaluate the vascular anatomy. CONTRAST:  16mL OMNIPAQUE IOHEXOL 350 MG/ML SOLN COMPARISON:  08/05/2027 CT angiogram of the abdomen and pelvis. 11/23/2018 chest CT. FINDINGS: CTA CHEST FINDINGS Cardiovascular: Mild-to-moderate  cardiomegaly. No significant pericardial effusion/thickening. Diffuse thickening and coarse calcification of the aortic valve. Left main and 3 vessel coronary atherosclerosis. Atherosclerotic thoracic aorta with 4.5 cm ascending thoracic aortic aneurysm. Top-normal caliber main pulmonary artery (3.2 cm diameter). No central pulmonary emboli. Mediastinum/Nodes: No discrete thyroid nodules. Unremarkable esophagus. No pathologically enlarged axillary, mediastinal or hilar lymph nodes. Lungs/Pleura: No pneumothorax. Small dependent bilateral pleural effusions, right greater than left. Mild centrilobular and paraseptal emphysema with diffuse bronchial wall thickening. Several solid pulmonary nodules scattered in the right greater than left lungs, largest 4 mm in the right lower lobe (series 5/image 86), all stable since 11/23/2018 chest CT and considered benign. Mild passive atelectasis in the dependent lower lobes. No acute consolidative airspace disease, lung masses or new significant pulmonary nodules. Musculoskeletal: No aggressive appearing focal osseous lesions. Moderate thoracic spondylosis. CTA ABDOMEN AND PELVIS FINDINGS Hepatobiliary: Normal liver with no liver mass. Contracted and otherwise normal gallbladder with no radiopaque cholelithiasis. No biliary ductal dilatation. Pancreas: Cystic 2.6 cm pancreatic body lesion (series 4/image 129), increased from 1.0 cm on 08/05/2019, with associated new pancreatic tail atrophy and duct dilation up to 5 mm diameter. Spleen: Normal size. No mass. Adrenals/Urinary Tract: Normal adrenals. No hydronephrosis. Simple 1.3 cm anterior upper right renal cyst. Several simple left renal cysts, largest 11.1 cm in the lower left kidney. Chronic mild diffuse bladder wall thickening and trabeculation with scattered small bladder diverticula, unchanged. Stomach/Bowel: Normal non-distended stomach. Normal caliber small bowel with no small bowel wall thickening. Normal appendix. Mild  sigmoid diverticulosis with no large bowel wall thickening or significant pericolonic fat stranding. Vascular/Lymphatic: Atherosclerotic abdominal aorta with ectatic 2.5 cm infrarenal abdominal aorta. No pathologically enlarged lymph nodes in the abdomen or pelvis. Reproductive: Moderate prostatomegaly. Other: No pneumoperitoneum, ascites or focal fluid collection. Musculoskeletal: No aggressive appearing focal osseous lesions. Lateral right eleventh rib fracture of uncertain chronicity, possibly acute. Moderate to marked lumbar degenerative disc disease, most prominent at L5-S1. VASCULAR MEASUREMENTS PERTINENT TO TAVR: AORTA: Minimal Aortic Diameter-16.2 x 16.0 mm Severity of Aortic Calcification-severe RIGHT PELVIS: Right Common Iliac Artery - Minimal Diameter-9.3 x 8.7 mm Tortuosity-moderate Calcification-moderate Right External Iliac Artery - Minimal Diameter-7.8 x 7.3 mm Tortuosity-moderate to severe Calcification-none Right Common Femoral Artery - Minimal Diameter-8.2 x 7.2 mm Tortuosity-mild Calcification-mild LEFT PELVIS: Left Common Iliac Artery - Minimal Diameter-9.3 x 7.1 mm Tortuosity-mild Calcification-severe Left External Iliac Artery - Minimal Diameter-8.7 x 7.5 mm Tortuosity-mild Calcification-mild Left Common Femoral Artery - Minimal Diameter-8.0 x 7.8 mm Tortuosity-mild Calcification-moderate Review of the MIP images confirms the above  findings. IMPRESSION: 1. Vascular findings and measurements pertinent to potential TAVR procedure, as detailed. 2. Diffusely thickened and calcified aortic valve, compatible with reported history of aortic stenosis. 3. Mild-to-moderate cardiomegaly. Left main and 3 vessel coronary atherosclerosis. 4. Small dependent bilateral pleural effusions, right greater than left. 5. Mild centrilobular and paraseptal emphysema with diffuse bronchial wall thickening, suggesting COPD. 6. Cystic 2.6 cm pancreatic body lesion with associated new pancreatic tail atrophy and duct  dilation, increased since 08/05/2019 CT. 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 MRI abdomen without and with IV contrast is indicated for further characterization. 7. Mild sigmoid diverticulosis. 8. Moderate prostatomegaly. Chronic mild diffuse bladder wall thickening and trabeculation with scattered small bladder diverticula, suggesting chronic bladder outlet obstruction. 9. Lateral right eleventh rib fracture of uncertain chronicity, possibly acute. 10. Aortic Atherosclerosis (ICD10-I70.0) and Emphysema (ICD10-J43.9). Electronically Signed   By: Ilona Sorrel M.D.   On: 10/19/2020 07:31   VAS US CAROTID  Result Date: 10/18/2020 Carotid Arterial Duplex Study Indications:       Pre TAVR. Comparison Study:  no prior Performing Technologist: Abram Sander RVS  Examination Guidelines: A complete evaluation includes B-mode imaging, spectral Doppler, color Doppler, and power Doppler as needed of all accessible portions of each vessel. Bilateral testing is considered an integral part of a complete examination. Limited examinations for reoccurring indications may be performed as noted.  Right Carotid Findings: +----------+--------+--------+--------+------------------+--------+             PSV cm/s EDV cm/s Stenosis Plaque Description Comments  +----------+--------+--------+--------+------------------+--------+  CCA Prox   51       9                 heterogenous                 +----------+--------+--------+--------+------------------+--------+  CCA Distal 45       12                heterogenous                 +----------+--------+--------+--------+------------------+--------+  ICA Prox   81       16       1-39%    heterogenous                 +----------+--------+--------+--------+------------------+--------+  ICA Distal 56       13                                             +----------+--------+--------+--------+------------------+--------+  ECA        76       8                                               +----------+--------+--------+--------+------------------+--------+ +----------+--------+-------+--------+-------------------+             PSV cm/s EDV cms Describe Arm Pressure (mmHG)  +----------+--------+-------+--------+-------------------+  Subclavian 68                                             +----------+--------+-------+--------+-------------------+ +---------+--------+--------+--------------+  Vertebral PSV cm/s EDV cm/s Not identified  +---------+--------+--------+--------------+  Left Carotid  Findings: +----------+--------+--------+--------+------------------+--------+             PSV cm/s EDV cm/s Stenosis Plaque Description Comments  +----------+--------+--------+--------+------------------+--------+  CCA Prox   129      11                heterogenous                 +----------+--------+--------+--------+------------------+--------+  CCA Distal 49       9                 heterogenous                 +----------+--------+--------+--------+------------------+--------+  ICA Prox   60       10       1-39%    heterogenous                 +----------+--------+--------+--------+------------------+--------+  ICA Distal 78       17                                             +----------+--------+--------+--------+------------------+--------+  ECA        106      10                                             +----------+--------+--------+--------+------------------+--------+ +----------+--------+--------+--------+-------------------+             PSV cm/s EDV cm/s Describe Arm Pressure (mmHG)  +----------+--------+--------+--------+-------------------+  Subclavian 62                                              +----------+--------+--------+--------+-------------------+ +---------+--------+--+--------+--+---------+  Vertebral PSV cm/s 58 EDV cm/s 12 Antegrade  +---------+--------+--+--------+--+---------+   Summary: Right Carotid: Velocities in the right ICA are consistent with a 1-39% stenosis. Left  Carotid: Velocities in the left ICA are consistent with a 1-39% stenosis. Vertebrals: Left vertebral artery demonstrates antegrade flow. Right vertebral             artery was not visualized. *See table(s) above for measurements and observations.     Preliminary    CT ANGIO ABDOMEN PELVIS  W &/OR WO CONTRAST  Result Date: 10/19/2020 CLINICAL DATA:  Aortic stenosis.  Pre-TAVR planning. EXAM: CT ANGIOGRAPHY CHEST, ABDOMEN AND PELVIS TECHNIQUE: Non-contrast CT of the chest was initially obtained. Multidetector CT imaging through the chest, abdomen and pelvis was performed using the standard protocol during bolus administration of intravenous contrast. Multiplanar reconstructed images and MIPs were obtained and reviewed to evaluate the vascular anatomy. CONTRAST:  89mL OMNIPAQUE IOHEXOL 350 MG/ML SOLN COMPARISON:  08/05/2027 CT angiogram of the abdomen and pelvis. 11/23/2018 chest CT. FINDINGS: CTA CHEST FINDINGS Cardiovascular: Mild-to-moderate cardiomegaly. No significant pericardial effusion/thickening. Diffuse thickening and coarse calcification of the aortic valve. Left main and 3 vessel coronary atherosclerosis. Atherosclerotic thoracic aorta with 4.5 cm ascending thoracic aortic aneurysm. Top-normal caliber main pulmonary artery (3.2 cm diameter). No central pulmonary emboli. Mediastinum/Nodes: No discrete thyroid nodules. Unremarkable esophagus. No pathologically enlarged axillary, mediastinal or hilar lymph nodes. Lungs/Pleura: No pneumothorax. Small dependent bilateral pleural effusions, right greater than left. Mild centrilobular and paraseptal  emphysema with diffuse bronchial wall thickening. Several solid pulmonary nodules scattered in the right greater than left lungs, largest 4 mm in the right lower lobe (series 5/image 86), all stable since 11/23/2018 chest CT and considered benign. Mild passive atelectasis in the dependent lower lobes. No acute consolidative airspace disease, lung masses or new  significant pulmonary nodules. Musculoskeletal: No aggressive appearing focal osseous lesions. Moderate thoracic spondylosis. CTA ABDOMEN AND PELVIS FINDINGS Hepatobiliary: Normal liver with no liver mass. Contracted and otherwise normal gallbladder with no radiopaque cholelithiasis. No biliary ductal dilatation. Pancreas: Cystic 2.6 cm pancreatic body lesion (series 4/image 129), increased from 1.0 cm on 08/05/2019, with associated new pancreatic tail atrophy and duct dilation up to 5 mm diameter. Spleen: Normal size. No mass. Adrenals/Urinary Tract: Normal adrenals. No hydronephrosis. Simple 1.3 cm anterior upper right renal cyst. Several simple left renal cysts, largest 11.1 cm in the lower left kidney. Chronic mild diffuse bladder wall thickening and trabeculation with scattered small bladder diverticula, unchanged. Stomach/Bowel: Normal non-distended stomach. Normal caliber small bowel with no small bowel wall thickening. Normal appendix. Mild sigmoid diverticulosis with no large bowel wall thickening or significant pericolonic fat stranding. Vascular/Lymphatic: Atherosclerotic abdominal aorta with ectatic 2.5 cm infrarenal abdominal aorta. No pathologically enlarged lymph nodes in the abdomen or pelvis. Reproductive: Moderate prostatomegaly. Other: No pneumoperitoneum, ascites or focal fluid collection. Musculoskeletal: No aggressive appearing focal osseous lesions. Lateral right eleventh rib fracture of uncertain chronicity, possibly acute. Moderate to marked lumbar degenerative disc disease, most prominent at L5-S1. VASCULAR MEASUREMENTS PERTINENT TO TAVR: AORTA: Minimal Aortic Diameter-16.2 x 16.0 mm Severity of Aortic Calcification-severe RIGHT PELVIS: Right Common Iliac Artery - Minimal Diameter-9.3 x 8.7 mm Tortuosity-moderate Calcification-moderate Right External Iliac Artery - Minimal Diameter-7.8 x 7.3 mm Tortuosity-moderate to severe Calcification-none Right Common Femoral Artery - Minimal  Diameter-8.2 x 7.2 mm Tortuosity-mild Calcification-mild LEFT PELVIS: Left Common Iliac Artery - Minimal Diameter-9.3 x 7.1 mm Tortuosity-mild Calcification-severe Left External Iliac Artery - Minimal Diameter-8.7 x 7.5 mm Tortuosity-mild Calcification-mild Left Common Femoral Artery - Minimal Diameter-8.0 x 7.8 mm Tortuosity-mild Calcification-moderate Review of the MIP images confirms the above findings. IMPRESSION: 1. Vascular findings and measurements pertinent to potential TAVR procedure, as detailed. 2. Diffusely thickened and calcified aortic valve, compatible with reported history of aortic stenosis. 3. Mild-to-moderate cardiomegaly. Left main and 3 vessel coronary atherosclerosis. 4. Small dependent bilateral pleural effusions, right greater than left. 5. Mild centrilobular and paraseptal emphysema with diffuse bronchial wall thickening, suggesting COPD. 6. Cystic 2.6 cm pancreatic body lesion with associated new pancreatic tail atrophy and duct dilation, increased since 08/05/2019 CT. 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 MRI abdomen without and with IV contrast is indicated for further characterization. 7. Mild sigmoid diverticulosis. 8. Moderate prostatomegaly. Chronic mild diffuse bladder wall thickening and trabeculation with scattered small bladder diverticula, suggesting chronic bladder outlet obstruction. 9. Lateral right eleventh rib fracture of uncertain chronicity, possibly acute. 10. Aortic Atherosclerosis (ICD10-I70.0) and Emphysema (ICD10-J43.9). Electronically Signed   By: Ilona Sorrel M.D.   On: 10/19/2020 07:31    Cardiac Studies   See above  Patient Profile     87 y.o. male with moderte AS, admitted with syncopal episode  Assessment & Plan    1.  Syncope:   No significant arrhythmias to explain his episode of syncope.  his AS is now severe. This is likely the cause of his syncope  - Appreciate structural team evaluation - plan cath on Monday, has had gated  CT scans  2.  Aortic stenosis:   Severe AS now. This is likely the etiology of his syncope. - cath on Monday with plans for probable TAVR next week.  3. Atrial fib:  Rate is controlled. Eliquis on hold for cath, on heparin gtts.  4.  Hyperlipidemia :   Stable. Continue atorvastatin 80 mg nightly.   For questions or updates, please contact [place] Please consult [email or web address] for contact info under   Pixie Casino, MD, FACC, [place] Director of the [facility] Diplomate of the American Board of Clinical Lipidology Attending Cardiologist  Direct Dial: [phone]   Fax: [phone]  Website:  [email or web address]  Pixie Casino, MD  10/20/2020, 7:42 AM

## 2020-10-20 NOTE — Progress Notes (Signed)
[place] for heparin Indication: atrial fibrillation and hx VTE  No Known Allergies  Patient Measurements: Height: 6\' 1"  (185.4 cm) Weight: 83.3 kg (183 lb 11.2 oz) IBW/kg (Calculated) : 79.9 Heparin Dosing Weight: 86.2 kg   Vital Signs: Temp: 97.9 F (36.6 C) (01/16 0423) Temp Source: Oral (01/16 0423) BP: 148/80 (01/16 0423) Pulse Rate: 65 (01/16 0423)  Labs: Recent Labs    10/19/20 0324 10/19/20 1424 10/20/20 0337  HGB  --   --  13.6  HCT  --   --  41.5  PLT  --   --  131*  APTT 123* 108*  --   HEPARINUNFRC 0.79* 0.62 0.41  CREATININE 1.43*  --  1.49*    Estimated Creatinine Clearance: 38 mL/min (A) (by C-G formula based on SCr of 1.49 mg/dL (H)).   Medical History: History reviewed. No pertinent past medical history.  Medications:  Scheduled:   atorvastatin  80 mg Oral QHS   divalproex  500 mg Oral QHS   finasteride  5 mg Oral Daily   sodium bicarbonate  650 mg Oral BID   sodium chloride flush  3 mL Intravenous Q12H   sodium chloride flush  3 mL Intravenous Q12H    Assessment: 16 yom presenting with syncopal episode, now severe AS, on apixaban (LD 1/14@0812 ).   Heparin level therapeutic this morning at 0.41 with rate of 1,000 units/hr. CBC WNL, No s/sx bleeding noted.  Goal of Therapy:  Heparin level 0.3-0.7 units/ml aPTT 66-102 seconds Monitor platelets by anticoagulation protocol: Yes   Plan:  Continue Heparin gtt at 1,000 units/hr Monitor daily HL, CBC, and for s/sx of bleeding F/u with resuming oral [place], PharmD PGY1 Acute Care Pharmacy Resident Please refer to [facility] for unit-specific pharmacist

## 2020-10-20 NOTE — Plan of Care (Signed)
°  Problem: Education: Goal: Knowledge of General Education information will improve Description: Including pain rating scale, medication(s)/side effects and non-pharmacologic comfort measures Outcome: Progressing   Problem: Health Behavior/Discharge Planning: Goal: Ability to manage health-related needs will improve Outcome: Progressing   Problem: Activity: Goal: Risk for activity intolerance will decrease Outcome: Progressing   

## 2020-10-21 ENCOUNTER — Encounter (HOSPITAL_COMMUNITY): Payer: Self-pay | Admitting: Internal Medicine

## 2020-10-21 ENCOUNTER — Encounter (HOSPITAL_COMMUNITY)
Admission: EM | Disposition: A | Discharge status: Home / Self Care | Payer: Self-pay | Source: Home / Self Care | Attending: Cardiovascular Disease

## 2020-10-21 ENCOUNTER — Ambulatory Visit: Payer: Medicare Other | Admitting: Neurology

## 2020-10-21 DIAGNOSIS — E785 Hyperlipidemia, unspecified: Secondary | ICD-10-CM

## 2020-10-21 DIAGNOSIS — N183 Chronic kidney disease, stage 3 unspecified: Secondary | ICD-10-CM

## 2020-10-21 DIAGNOSIS — I350 Nonrheumatic aortic (valve) stenosis: Secondary | ICD-10-CM | POA: Diagnosis not present

## 2020-10-21 DIAGNOSIS — I482 Chronic atrial fibrillation, unspecified: Secondary | ICD-10-CM

## 2020-10-21 HISTORY — PX: RIGHT HEART CATH AND CORONARY ANGIOGRAPHY: CATH118264

## 2020-10-21 LAB — POCT I-STAT EG7
Acid-base deficit: 1 mmol/L (ref 0.0–2.0)
Bicarbonate: 24.9 mmol/L (ref 20.0–28.0)
Calcium, Ion: 1.15 mmol/L (ref 1.15–1.40)
HCT: 38 % — ABNORMAL LOW (ref 39.0–52.0)
Hemoglobin: 12.9 g/dL — ABNORMAL LOW (ref 13.0–17.0)
O2 Saturation: 71 %
Potassium: 4.5 mmol/L (ref 3.5–5.1)
Sodium: 139 mmol/L (ref 135–145)
TCO2: 26 mmol/L (ref 22–32)
pCO2, Ven: 43.2 mmHg — ABNORMAL LOW (ref 44.0–60.0)
pH, Ven: 7.368 (ref 7.250–7.430)
pO2, Ven: 39 mmHg (ref 32.0–45.0)

## 2020-10-21 LAB — LIPID PANEL
Cholesterol: 120 mg/dL (ref 0–200)
HDL: 62 mg/dL (ref >40)
LDL Cholesterol: 47 mg/dL (ref 0–99)
Total CHOL/HDL Ratio: 1.9 RATIO
Triglycerides: 53 mg/dL (ref <150)
VLDL: 11 mg/dL (ref 0–40)

## 2020-10-21 LAB — POCT I-STAT 7, (LYTES, BLD GAS, ICA,H+H)
Acid-Base Excess: 0 mmol/L (ref 0.0–2.0)
Bicarbonate: 24.3 mmol/L (ref 20.0–28.0)
Calcium, Ion: 1.24 mmol/L (ref 1.15–1.40)
HCT: 38 % — ABNORMAL LOW (ref 39.0–52.0)
Hemoglobin: 12.9 g/dL — ABNORMAL LOW (ref 13.0–17.0)
O2 Saturation: 99 %
Potassium: 4.6 mmol/L (ref 3.5–5.1)
Sodium: 136 mmol/L (ref 135–145)
TCO2: 26 mmol/L (ref 22–32)
pCO2 arterial: 39.3 mmHg (ref 32.0–48.0)
pH, Arterial: 7.399 (ref 7.350–7.450)
pO2, Arterial: 144 mmHg — ABNORMAL HIGH (ref 83.0–108.0)

## 2020-10-21 LAB — CBC
HCT: 40.3 % (ref 39.0–52.0)
Hemoglobin: 13.2 g/dL (ref 13.0–17.0)
MCH: 31.6 pg (ref 26.0–34.0)
MCHC: 32.8 g/dL (ref 30.0–36.0)
MCV: 96.4 fL (ref 80.0–100.0)
Platelets: 136 10*3/uL — ABNORMAL LOW (ref 150–400)
RBC: 4.18 MIL/uL — ABNORMAL LOW (ref 4.22–5.81)
RDW: 13.2 % (ref 11.5–15.5)
WBC: 5.8 10*3/uL (ref 4.0–10.5)
nRBC: 0 % (ref 0.0–0.2)

## 2020-10-21 LAB — BASIC METABOLIC PANEL
Anion gap: 9 (ref 5–15)
BUN: 27 mg/dL — ABNORMAL HIGH (ref 8–23)
CO2: 25 mmol/L (ref 22–32)
Calcium: 8.8 mg/dL — ABNORMAL LOW (ref 8.9–10.3)
Chloride: 101 mmol/L (ref 98–111)
Creatinine, Ser: 1.5 mg/dL — ABNORMAL HIGH (ref 0.61–1.24)
GFR, Estimated: 44 mL/min — ABNORMAL LOW (ref >60)
Glucose, Bld: 103 mg/dL — ABNORMAL HIGH (ref 70–99)
Potassium: 4.6 mmol/L (ref 3.5–5.1)
Sodium: 135 mmol/L (ref 135–145)

## 2020-10-21 LAB — SURGICAL PCR SCREEN
MRSA, PCR: NEGATIVE
Staphylococcus aureus: NEGATIVE

## 2020-10-21 LAB — TYPE AND SCREEN
ABO/RH(D): A POS
Antibody Screen: NEGATIVE

## 2020-10-21 LAB — HEPARIN LEVEL (UNFRACTIONATED): Heparin Unfractionated: 0.36 IU/mL (ref 0.30–0.70)

## 2020-10-21 LAB — ABO/RH: ABO/RH(D): A POS

## 2020-10-21 SURGERY — RIGHT HEART CATH AND CORONARY ANGIOGRAPHY
Anesthesia: LOCAL

## 2020-10-21 MED ORDER — HEPARIN SODIUM (PORCINE) 1000 UNIT/ML IJ SOLN
INTRAMUSCULAR | Status: DC | PRN
  Administered 2020-10-21: 4000 [IU] via INTRAVENOUS

## 2020-10-21 MED ORDER — FENTANYL CITRATE (PF) 100 MCG/2ML IJ SOLN
INTRAMUSCULAR | Status: AC
  Filled 2020-10-21: qty 2

## 2020-10-21 MED ORDER — SODIUM CHLORIDE 0.9 % IV SOLN
1.5000 g | INTRAVENOUS | Status: AC
  Administered 2020-10-22: 1.5 g via INTRAVENOUS
  Filled 2020-10-21: qty 1.5

## 2020-10-21 MED ORDER — MAGNESIUM SULFATE 50 % IJ SOLN
40.0000 meq | INTRAMUSCULAR | Status: DC
  Filled 2020-10-21: qty 9.85

## 2020-10-21 MED ORDER — SODIUM CHLORIDE 0.9 % IV SOLN
INTRAVENOUS | Status: DC
  Filled 2020-10-21: qty 30

## 2020-10-21 MED ORDER — LABETALOL HCL 5 MG/ML IV SOLN: 10.0000 mg | INTRAVENOUS | Status: AC | PRN

## 2020-10-21 MED ORDER — POTASSIUM CHLORIDE 2 MEQ/ML IV SOLN
80.0000 meq | INTRAVENOUS | Status: DC
  Filled 2020-10-21: qty 40

## 2020-10-21 MED ORDER — VERAPAMIL HCL 2.5 MG/ML IV SOLN
INTRAVENOUS | Status: DC | PRN
  Administered 2020-10-21: 10 mL via INTRA_ARTERIAL

## 2020-10-21 MED ORDER — VERAPAMIL HCL 2.5 MG/ML IV SOLN
INTRAVENOUS | Status: AC
  Filled 2020-10-21: qty 2

## 2020-10-21 MED ORDER — NOREPINEPHRINE 4 MG/250ML-% IV SOLN
0.0000 ug/min | INTRAVENOUS | Status: AC
  Administered 2020-10-22: 2 ug/min via INTRAVENOUS
  Filled 2020-10-21: qty 250

## 2020-10-21 MED ORDER — MIDAZOLAM HCL 2 MG/2ML IJ SOLN
INTRAMUSCULAR | Status: AC
  Filled 2020-10-21: qty 2

## 2020-10-21 MED ORDER — HEPARIN (PORCINE) IN NACL 1000-0.9 UT/500ML-% IV SOLN
INTRAVENOUS | Status: DC | PRN
  Administered 2020-10-21 (×2): 500 mL

## 2020-10-21 MED ORDER — IOHEXOL 350 MG/ML SOLN
INTRAVENOUS | Status: DC | PRN
  Administered 2020-10-21: 70 mL

## 2020-10-21 MED ORDER — LIDOCAINE HCL (PF) 1 % IJ SOLN
INTRAMUSCULAR | Status: DC | PRN
  Administered 2020-10-21 (×2): 2 mL

## 2020-10-21 MED ORDER — SODIUM CHLORIDE 0.9% FLUSH: 3.0000 mL | INTRAVENOUS | Status: DC | PRN

## 2020-10-21 MED ORDER — SODIUM CHLORIDE 0.9 % IV SOLN: 250.0000 mL | INTRAVENOUS | Status: DC | PRN

## 2020-10-21 MED ORDER — HEPARIN (PORCINE) IN NACL 1000-0.9 UT/500ML-% IV SOLN
INTRAVENOUS | Status: AC
  Filled 2020-10-21: qty 1000

## 2020-10-21 MED ORDER — VANCOMYCIN HCL 1250 MG/250ML IV SOLN
1250.0000 mg | INTRAVENOUS | Status: AC
  Administered 2020-10-22: 1250 mg via INTRAVENOUS
  Filled 2020-10-21: qty 250

## 2020-10-21 MED ORDER — SODIUM CHLORIDE 0.9 % IV SOLN: INTRAVENOUS | Status: AC

## 2020-10-21 MED ORDER — HEPARIN SODIUM (PORCINE) 1000 UNIT/ML IJ SOLN
INTRAMUSCULAR | Status: AC
  Filled 2020-10-21: qty 1

## 2020-10-21 MED ORDER — LIDOCAINE HCL (PF) 1 % IJ SOLN
INTRAMUSCULAR | Status: AC
  Filled 2020-10-21: qty 30

## 2020-10-21 MED ORDER — DEXMEDETOMIDINE HCL IN NACL 400 MCG/100ML IV SOLN
0.1000 ug/kg/h | INTRAVENOUS | Status: AC
  Administered 2020-10-22: 38.44 ug via INTRAVENOUS
  Filled 2020-10-21: qty 100

## 2020-10-21 MED ORDER — SODIUM CHLORIDE 0.9% FLUSH
3.0000 mL | Freq: Two times a day (BID) | INTRAVENOUS | Status: DC
  Administered 2020-10-21: 3 mL via INTRAVENOUS

## 2020-10-21 SURGICAL SUPPLY — 14 items
CATH BALLN WEDGE 5F 110CM (CATHETERS) ×2 IMPLANT
CATH INFINITI 5 FR AL2 (CATHETERS) ×2 IMPLANT
CATH INFINITI 5FR MULTPACK ANG (CATHETERS) ×2 IMPLANT
CATH OPTITORQUE TIG 4.0 5F (CATHETERS) ×2 IMPLANT
DEVICE RAD COMP TR BAND LRG (VASCULAR PRODUCTS) ×2 IMPLANT
GLIDESHEATH SLEND SS 6F .021 (SHEATH) ×2 IMPLANT
GUIDEWIRE INQWIRE 1.5J.035X260 (WIRE) ×1 IMPLANT
INQWIRE 1.5J .035X260CM (WIRE) ×2
KIT HEART LEFT (KITS) ×2 IMPLANT
PACK CARDIAC CATHETERIZATION (CUSTOM PROCEDURE TRAY) ×2 IMPLANT
SHEATH GLIDE SLENDER 4/5FR (SHEATH) ×2 IMPLANT
TRANSDUCER W/STOPCOCK (MISCELLANEOUS) ×2 IMPLANT
TUBING CIL FLEX 10 FLL-RA (TUBING) ×2 IMPLANT
WIRE EMERALD 3MM-J .025X260CM (WIRE) ×2 IMPLANT

## 2020-10-21 NOTE — Anesthesia Preprocedure Evaluation (Addendum)
Anesthesia Evaluation  Patient identified by MRN, date of birth, ID band Patient awake    Reviewed: Allergy & Precautions, NPO status , Patient's Chart, lab work & pertinent test results  Airway Mallampati: II  TM Distance: >3 FB Neck ROM: Full    Dental  (+) Dental Advisory Given, Upper Dentures, Partial Lower   Pulmonary sleep apnea ,    Pulmonary exam normal breath sounds clear to auscultation       Cardiovascular + Peripheral Vascular Disease  + dysrhythmias Atrial Fibrillation + Valvular Problems/Murmurs AS  Rhythm:Irregular Rate:Abnormal + Systolic murmurs Echo 1/96/22: 1. Left ventricular ejection fraction, by estimation, is 50 to 55%. The  left ventricle has low normal function. The left ventricle has no regional  wall motion abnormalities. There is moderate left ventricular hypertrophy.  Left ventricular diastolic  parameters are indeterminate.  2. Right ventricular systolic function is mildly reduced. The right  ventricular size is normal. There is normal pulmonary artery systolic  pressure. The estimated right ventricular systolic pressure is 29.7 mmHg.  3. Left atrial size was severely dilated.  4. Right atrial size was severely dilated.  5. The mitral valve is abnormal. Mild mitral valve regurgitation. Severe  mitral annular calcification.  6. The inferior vena cava is dilated in size with >50% respiratory  variability, suggesting right atrial pressure of 8 mmHg.  7. The aortic valve is calcified. There is severe calcifcation of the  aortic valve. Aortic valve regurgitation is mild to moderate. Severe  aortic valve stenosis. Vmax 4.1 m/s, MG 37 mmHg, AVA 0.4 cm^2, DI 0.14    Neuro/Psych CVA    GI/Hepatic negative GI ROS, Neg liver ROS,   Endo/Other  negative endocrine ROS  Renal/GU Renal InsufficiencyRenal disease     Musculoskeletal negative musculoskeletal ROS (+)   Abdominal   Peds   Hematology  (+) Blood dyscrasia (Thrombocytopenia; Eliquis), anemia ,   Anesthesia Other Findings   Reproductive/Obstetrics                            Anesthesia Physical Anesthesia Plan  ASA: IV  Anesthesia Plan: MAC   Post-op Pain Management:    Induction: Intravenous  PONV Risk Score and Plan: 1 and Propofol infusion  Airway Management Planned: Natural Airway and Nasal Cannula  Additional Equipment: Arterial line  Intra-op Plan:   Post-operative Plan:   Informed Consent: I have reviewed the patients History and Physical, chart, labs and discussed the procedure including the risks, benefits and alternatives for the proposed anesthesia with the patient or authorized representative who has indicated his/her understanding and acceptance.     Dental advisory given  Plan Discussed with: CRNA  Anesthesia Plan Comments:        Anesthesia Quick Evaluation

## 2020-10-21 NOTE — Progress Notes (Signed)
[facility] Interventional Cardiology Note  Date: 10/21/20 Time: 7:31 AM  Patient presents for [facility] for further workup of severe aortic.  He currently has a DNR/DNI order in place but has agreed to temporarily suspend this and to be full code during the cardiac catheterization procedure today.  [name], MD [facility]

## 2020-10-21 NOTE — Interval H&P Note (Signed)
History and Physical Interval Note:  10/21/2020 7:29 AM  Cesar [name]  has presented today for surgery, with the diagnosis of syncope and severe aortic stenosis.  The various methods of treatment have been discussed with the patient and family. After consideration of risks, benefits and other options for treatment, the patient has consented to  Procedure(s): RIGHT/LEFT HEART CATH AND CORONARY ANGIOGRAPHY (N/A) as a surgical intervention.  The patient's history has been reviewed, patient examined, no change in status, stable for surgery.  I have reviewed the patient's chart and labs.  Questions were answered to the patient's satisfaction.    Cath Lab Visit (complete for each Cath Lab visit)  Clinical Evaluation Leading to the Procedure:   ACS: No.  Non-ACS:    Anginal Classification: No Symptoms  Anti-ischemic medical therapy: No Therapy  Non-Invasive Test Results: No non-invasive testing performed  Prior CABG: No previous CABG  Tysheka Fanguy

## 2020-10-21 NOTE — Progress Notes (Addendum)
[address]       [place], [address]             [phone]     CARDIOTHORACIC SURGERY PROGRESS NOTE  Day of Surgery  S/P Procedure(s) (LRB): RIGHT HEART CATH AND CORONARY ANGIOGRAPHY (N/A)  Subjective: No complaints.  Did well with cath this morning.  Objective: Vital signs in last 24 hours: Temp:  [97.2 F (36.2 C)-97.9 F (36.6 C)] 97.9 F (36.6 C) (01/17 0526) Pulse Rate:  [63-76] 64 (01/17 0827) Cardiac Rhythm: Atrial fibrillation (01/17 0701) Resp:  [14-27] 17 (01/17 0827) BP: (112-155)/(61-84) 141/78 (01/17 0827) SpO2:  [94 %-100 %] 100 % (01/17 0827) Weight:  [76.9 kg] 76.9 kg (01/17 0526)  Physical Exam:  Rhythm:   Afib  Breath sounds: clear  Heart sounds:  Irregular w/ systolic murmur  Incisions:  Radial site intact  Abdomen:  Soft, non-distended, non-tender  Extremities:  Warm, well-perfused    Intake/Output from previous day: 01/16 0701 - 01/17 0700 In: 900.2 [P.O.:720; I.V.:180.2] Out: 1200 [Urine:1200] Intake/Output this shift: Total I/O In: 360 [P.O.:360] Out: -   Lab Results: Recent Labs    10/20/20 0337 10/21/20 0311  WBC 5.9 5.8  HGB 13.6 13.2  HCT 41.5 40.3  PLT 131* 136*   BMET:  Recent Labs    10/20/20 0337 10/21/20 0311  NA 135 135  K 4.4 4.6  CL 102 101  CO2 22 25  GLUCOSE 102* 103*  BUN 27* 27*  CREATININE 1.49* 1.50*  CALCIUM 8.7* 8.8*    CBG (last 3)  Recent Labs    10/19/20 0504 10/20/20 0358  GLUCAP 99 106*   PT/INR:  No results for input(s): LABPROT, INR in the last 72 hours.  CARDIAC CATHETERIZATION:    RIGHT HEART CATH AND CORONARY ANGIOGRAPHY    Conclusion  Conclusions: 1. Mild to moderate coronary artery disease, including 40-50% ostial LMCA, sequential 20% proximal and 10-20% distal LAD lesions, and 30% ostial RCA stenosis. 2. Upper normal left heart, right heart, and pulmonary artery pressures. 3. Normal Fick cardiac output/index.  Recommendations: 1. Ongoing workup  of severe aortic stenosis per valve team. 2. Medical therapy and risk factor modification to prevent progression of coronary artery disease. 3. Restart heparin infusion 2 hours after TR band deflation.  [name], MD [facility]   Recommendations  Antiplatelet/Anticoag Recommend to resume Apixaban, at currently prescribed dose and frequency. Concurrent antiplatelet therapy not recommended. Restart heparin 2 hours after TR band deflation.  Defer restarting apixaban pending aortic valve intervention.  Discharge Date Anticipated discharge date to be determined.   Indications  Syncope, unspecified syncope type [R55 (ICD-10-CM)]  Severe aortic stenosis [I35.0 (ICD-10-CM)]   Procedural Details  Technical Details Indication: 87 y.o. year-old man with history of permanent atrial fibrillation on Eliquis, aortic stenosis, arterial thrombosis, AAA, prior CVA, CKD stage 3, hyperlipidemia, sleep apnea and thoracic aortic aneurysm, admitted with syncope and found to have severe aortic stenosis.  GFR: 44 ml/min  Procedure: The risks, benefits, complications, treatment options, and expected outcomes were discussed with the patient. The patient and/or family concurred with the proposed plan, giving informed consent. The patient was sedated with IV midazolam and fentanyl. The right wrist and elbow were prepped and draped in a sterile fashion. 1% lidocaine was used for local anesthesia. A previously placed antecubital vein IV was exchanged for a 43F slender Glidesheath using modified Seldinger technique. Right heart catheterization was performed by advancing a 43F balloon-tipped  catheter through the right heart chambers into the pulmonary capillary wedge position. Pressure measurements and oxygen saturations were obtained.  Using the modified Seldinger access technique, a 67F slender Glidesheath was placed in the right radial artery. 3 mg Verapamil was given through the sheath. Heparin 4,000 units  were administered.  Selective coronary angiography was performed using a 70F JL4 catheter to engage the left coronary artery and a 70F TIG4.0 catheter to engage the right coronary artery. Left heart catheterization was not performed.  At the end of the procedure, the radial artery sheath was removed and a TR band applied to achieve patent hemostasis. There were no immediate complications. The patient was taken to the recovery area in stable condition.  Estimated blood loss <50 mL.   During this procedure no sedation was administered.   Medications (Filter: Administrations occurring from [id number] to 0844 on 10/21/20) (important) Continuous medications are totaled by the amount administered until 10/21/20 0844.    lidocaine (PF) (XYLOCAINE) 1 % injection (mL) Total volume:  4 mL  Date/Time Rate/Dose/Volume Action   10/21/20 0749 2 mL Given   0758 2 mL Given    Radial Cocktail/Verapamil only (mL) Total volume:  10 mL  Date/Time Rate/Dose/Volume Action   10/21/20 0801 10 mL Given    heparin sodium (porcine) injection (Units) Total dose:  4,000 Units  Date/Time Rate/Dose/Volume Action   10/21/20 0803 4,000 Units Given    iohexol (OMNIPAQUE) 350 MG/ML injection (mL) Total volume:  70 mL  Date/Time Rate/Dose/Volume Action   10/21/20 0825 70 mL Given    Heparin (Porcine) in NaCl 1000-0.9 UT/500ML-% SOLN (mL) Total volume:  1,000 mL  Date/Time Rate/Dose/Volume Action   10/21/20 0820 500 mL Given   0820 500 mL Given    heparin ADULT infusion 100 units/mL (25000 units/283mL) (Units/hr) Total dose:  Cannot be calculated* Dosing weight:  82.6  *Administration dose not documented Date/Time Rate/Dose/Volume Action   10/21/20 0725  Stopped    Contrast  Medication Name Total Dose  iohexol (OMNIPAQUE) 350 MG/ML injection 70 mL    Radiation/Fluoro  Fluoro time: 10.2 (min) DAP: 21.7 (Gycm2) Cumulative Air Kerma: 99991111 (mGy)   Complications   Complications documented before  study signed (10/21/2020 [id number] AM)    No complications were associated with this study.  Documented by [name], MD - 10/21/2020 8:49 AM     Coronary Findings   Diagnostic Dominance: Right  Left Main  Vessel is large.  Ost LM lesion is 45% stenosed.  Left Anterior Descending  Vessel is large.  Prox LAD lesion is 20% stenosed. The lesion is eccentric.  Dist LAD lesion is 15% stenosed. The lesion is eccentric.  First Diagonal Branch  Vessel is small in size.  Second Diagonal Branch  Vessel is small in size.  Third Diagonal Branch  Vessel is large in size.  Ramus Intermedius  Vessel is small. Vessel is angiographically normal.  Left Circumflex  Vessel is large. Vessel is angiographically normal.  First Obtuse Marginal Branch  Vessel is large in size.  Right Coronary Artery  Vessel is large.  Ost RCA lesion is 40% stenosed.  Right Posterior Descending Artery  Vessel is large in size.  Right Posterior Atrioventricular Artery  Vessel is large in size.  First Right Posterolateral Branch  Vessel is small in size.  Second Right Posterolateral Branch  Vessel is large in size.   Intervention   No interventions have been documented.  Right Heart  Right Heart Pressures RA (mean): 6 mmHg  RV (S/EDP): 40/6 mmHg PA (S/D, mean): 40/15 (23) mmHg PCWP (mean): 15 mmHg  Ao sat: 99% PA sat: 71%  Fick CO: 5.5 L/min Fick CI: 2.7 L/min/m^2   Coronary Diagrams   Diagnostic Dominance: Right    Intervention    Implants    No implant documentation for this case.    Syngo Images  Show images for CARDIAC CATHETERIZATION  Images on Long Term Storage  Show images for [employer] to Procedure Log  Procedure Log     Hemo Data  Flowsheet Row Most Recent Value  Fick Cardiac Output 5.48 L/min  Fick Cardiac Output Index 2.65 (L/min)/BSA  RA A Wave 4 mmHg  RA V Wave 5 mmHg  RA Mean 3 mmHg  RV Systolic Pressure 35 mmHg  RV Diastolic  Pressure 0 mmHg  RV EDP 3 mmHg  PA Systolic Pressure 33 mmHg  PA Diastolic Pressure 10 mmHg  PA Mean 20 mmHg  PW A Wave 12 mmHg  PW V Wave 17 mmHg  PW Mean 9 mmHg  AO Systolic Pressure 829 mmHg  AO Diastolic Pressure 57 mmHg  AO Mean 83 mmHg  QP/QS 1  TPVR Index 7.56 HRUI  TSVR Index 31.38 HRUI  PVR SVR Ratio 0.14  TPVR/TSVR Ratio 0.24      Assessment/Plan: S/P Procedure(s) (LRB): RIGHT HEART CATH AND CORONARY ANGIOGRAPHY (N/A)  I have personally reviewed results of the patient's diagnostic cardiac catheterization performed earlier today which demonstrates mild nonobstructive coronary artery disease and normal cardiac output with normal right heart pressures.  As previously noted, I feel the patient would best be treated with transcatheter aortic valve replacement.  The patient and his daughter Hubbard Robinson were counseled at the bedside regarding treatment alternatives for management of severe symptomatic aortic stenosis. Alternative approaches such as conventional aortic valve replacement, transcatheter aortic valve replacement, and continued medical therapy without intervention were compared and contrasted at length.  The risks associated with conventional surgical aortic valve replacement were discussed in detail, as were expectations for post-operative convalescence, and why I would be reluctant to consider this patient a candidate for conventional surgery.  Issues specific to transcatheter aortic valve replacement were discussed including questions about long term valve durability, the potential for paravalvular leak, possible increased risk of need for permanent pacemaker placement, and other technical complications related to the procedure itself.  Long-term prognosis with medical therapy was discussed. This discussion was placed in the context of the patient's own specific clinical presentation and past medical history.  All of their questions have been addressed.  The patient desires to  proceed with transcatheter aortic valve replacement via transfemoral approach in the operating room tomorrow.  We will not resume heparin between now and the time of surgery.  Following the decision to proceed with transcatheter aortic valve replacement, a discussion has been held regarding what types of management strategies would be attempted intraoperatively in the event of life-threatening complications, including whether or not the patient would be considered a candidate for the use of cardiopulmonary bypass and/or conversion to open sternotomy for attempted surgical intervention.  The patient specifically requests that should a potentially life-threatening complication develop we would attempt emergency median sternotomy and/or other aggressive surgical procedures.  Under the circumstances we will temporarily reverse his previous decision regarding CODE STATUS in the medical record, as the patient and his daughter understand that there are intraoperative procedural complications that might occur which could be easily corrected at the time of surgery but also  require at least temporary intubation for mechanical ventilation, cardioversion, close chest CPR, or even sternotomy.  The patient has been advised of a variety of complications that might develop including but not limited to risks of death, stroke, paravalvular leak, aortic dissection or other major vascular complications, aortic annulus rupture, device embolization, cardiac rupture or perforation, mitral regurgitation, acute myocardial infarction, arrhythmia, heart block or bradycardia requiring permanent pacemaker placement, congestive heart failure, respiratory failure, renal failure, pneumonia, infection, other late complications related to structural valve deterioration or migration, or other complications that might ultimately cause a temporary or permanent loss of functional independence or other long term morbidity.  The patient provides full  informed consent for the procedure as described and all questions were answered.   I spent in excess of 30 minutes during the conduct of this hospital encounter and >50% of this time involved direct face-to-face encounter with the patient for counseling and/or coordination of their care.   Rexene Alberts, MD 10/21/2020 10:12 AM

## 2020-10-21 NOTE — Progress Notes (Signed)
Progress Note  Patient Name: Cesar [name] Date of Encounter: 10/21/2020  [facility] Cardiologist: [name], MD   Subjective   Feels well post cath, no SOB, PND, orthopnea.  Inpatient Medications    Scheduled Meds:  atorvastatin  80 mg Oral QHS   divalproex  500 mg Oral QHS   finasteride  5 mg Oral Daily   sodium bicarbonate  650 mg Oral BID   sodium chloride flush  3 mL Intravenous Q12H   sodium chloride flush  3 mL Intravenous Q12H   sodium chloride flush  3 mL Intravenous Q12H   Continuous Infusions:  sodium chloride 50 mL/hr at 10/21/20 0925   sodium chloride     PRN Meds: sodium chloride, acetaminophen **OR** acetaminophen, hydrALAZINE, labetalol, ondansetron **OR** ondansetron (ZOFRAN) IV, sodium chloride flush   Vital Signs    Vitals:   10/21/20 0812 10/21/20 0817 10/21/20 0822 10/21/20 0827  BP: (!) 141/75 (!) 143/76 (!) 151/77 (!) 141/78  Pulse: 63 64 65 64  Resp: (!) 21 19 18 17   Temp:      TempSrc:      SpO2: 99% 100% 100% 100%  Weight:      Height:        Intake/Output Summary (Last 24 hours) at 10/21/2020 1039 Last data filed at 10/21/2020 0925 Gross per 24 hour  Intake 1017.23 ml  Output 1200 ml  Net -182.77 ml   Last 3 Weights 10/21/2020 10/20/2020 10/19/2020  Weight (lbs) 169 lb 8 oz 183 lb 11.2 oz 183 lb 1.6 oz  Weight (kg) 76.885 kg 83.326 kg 83.054 kg      Telemetry    SR rate 72 - Personally Reviewed  ECG    No new - Personally Reviewed  Physical Exam   GEN: No acute distress.   Neck: No JVD Cardiac: RRR, no rubs, or gallops. Systolic crescendo, +1 R radial TR bend still in place Respiratory: Clear to auscultation bilaterally. GI: Soft, nontender, non-distended  MS: No edema; No deformity. Neuro:  Nonfocal  Psych: Normal affect   Labs    High Sensitivity Troponin:  No results for input(s): TROPONINIHS in the last 720 hours.    Chemistry Recent Labs  Lab 10/16/20 1000 10/17/20 0304 10/19/20 0324  10/20/20 0337 10/21/20 0311  NA 139   < > 137 135 135  K 4.7   < > 4.5 4.4 4.6  CL 108   < > 104 102 101  CO2 19*   < > 24 22 25   GLUCOSE 108*   < > 99 102* 103*  BUN 25*   < > 24* 27* 27*  CREATININE 1.46*   < > 1.43* 1.49* 1.50*  CALCIUM 9.1   < > 8.7* 8.7* 8.8*  PROT 6.6  --   --   --   --   ALBUMIN 3.4*  --   --   --   --   AST 40  --   --   --   --   ALT 29  --   --   --   --   ALKPHOS 61  --   --   --   --   BILITOT 1.3*  --   --   --   --   GFRNONAA 46*   < > 47* 45* 44*  ANIONGAP 12   < > 9 11 9    < > = values in this interval not displayed.     Hematology Recent Labs  Lab 10/17/20 0304 10/20/20 0337 10/21/20 0311  WBC 6.0 5.9 5.8  RBC 3.73* 4.29 4.18*  HGB 11.7* 13.6 13.2  HCT 37.0* 41.5 40.3  MCV 99.2 96.7 96.4  MCH 31.4 31.7 31.6  MCHC 31.6 32.8 32.8  RDW 13.4 13.2 13.2  PLT 121* 131* 136*    BNPNo results for input(s): BNP, PROBNP in the last 168 hours.   DDimer No results for input(s): DDIMER in the last 168 hours.   Radiology    CARDIAC CATHETERIZATION  Result Date: 10/21/2020 Conclusions: 1. Mild to moderate coronary artery disease, including 40-50% ostial LMCA, sequential 20% proximal and 10-20% distal LAD lesions, and 30% ostial RCA stenosis. 2. Upper normal left heart, right heart, and pulmonary artery pressures. 3. Normal Fick cardiac output/index. Recommendations: 1. Ongoing workup of severe aortic stenosis per valve team. 2. Medical therapy and risk factor modification to prevent progression of coronary artery disease. 3. Restart heparin infusion 2 hours after TR band deflation. [name], MD [facility]    Cardiac Studies   [facility] 10/21/20: Conclusions: 1. Mild to moderate coronary artery disease, including 40-50% ostial LMCA, sequential 20% proximal and 10-20% distal LAD lesions, and 30% ostial RCA stenosis. 2. Upper normal left heart, right heart, and pulmonary artery pressures. 3. Normal Fick cardiac  output/index.  Recommendations: 1. Ongoing workup of severe aortic stenosis per valve team. 2. Medical therapy and risk factor modification to prevent progression of coronary artery disease. 3. Restart heparin infusion 2 hours after TR band deflation.  Diagnostic Dominance: Right       Echocardiogram 10/17/20: 1. Left ventricular ejection fraction, by estimation, is 50 to 55%. The  left ventricle has low normal function. The left ventricle has no regional  wall motion abnormalities. There is moderate left ventricular hypertrophy.  Left ventricular diastolic  parameters are indeterminate.  2. Right ventricular systolic function is mildly reduced. The right  ventricular size is normal. There is normal pulmonary artery systolic  pressure. The estimated right ventricular systolic pressure is [id number] mmHg.  3. Left atrial size was severely dilated.  4. Right atrial size was severely dilated.  5. The mitral valve is abnormal. Mild mitral valve regurgitation. Severe  mitral annular calcification.  6. The inferior vena cava is dilated in size with >50% respiratory  variability, suggesting right atrial pressure of 8 mmHg.  7. The aortic valve is calcified. There is severe calcifcation of the  aortic valve. Aortic valve regurgitation is mild to moderate. Severe  aortic valve stenosis. Vmax 4.1 m/s, MG 37 mmHg, AVA 0.4 cm^2, DI 0.14     Patient Profile     87 y.o. male with PMH of moderate aortic stenosis, chronic atrial fibrillation on eliquis, HTN, HLD, seizure disorder, CKD stage 3, OSA not on CPAP, who presented with syncopal episode, found to have progression of his aortic stenosis and is undergoing work-up for TAVR.  Assessment & Plan    1. Severe aortic stenosis: patient presented with a syncopal episode. Found to have progression of his aortic stenosis from moderate to severe. Seen by the structural heart team with plans to pursue TAVR work-up. He underwent [facility] 10/21/20  which showed mild-moderate non-obstructive CAD. Anticipating TAVR tomorrow - Will keep NPO after MN  2. Syncope: no significant arrhythmias this admission. Felt to be 2/2 #1 - Continue to monitor on telemetry  3. Chronic atrial fibrillation: rates well controlled without aid of AV nodal blocking agents. Eliquis on hold for upcoming procedure.  - Continue heparin gtt for  stroke ppx - Anticipate restarting eliquis when cleared to do so post-op  4. Non-obstructive CAD: noted on cath today to have 45% LM, 20% pLAD, and 40% pRCA stenosis. On anticoagulation at baseline so aspirin not initiated.  - Continue statin  5. HTN: BP intermittently elevated.  - Favor ongoing monitoring - can re-evaluate need for antihypertensives following TAVR.   6. HLD: no recent lipids on file; goal LDL <70 - Continue atorvastatin - Will add on FLP to AM labs for risk stratification in light of his mild-moderate CAD  7. CKD stage 3: Cr stable at 1.5 - Continue to monitor closely with recent procedure and plans for TAVR tomorrow  Discussed with daughter and patient (hard of hearing)  For questions or updates, please contact [place] Please consult [email or web address] for contact info under        Signed, Werner Lean, MD  .

## 2020-10-21 NOTE — Progress Notes (Signed)
PT Cancellation Note  Patient Details Name: Cesar [name] MRN: [id number] DOB: Jul 29, 1929   Cancelled Treatment:    Reason Eval/Treat Not Completed: Patient at procedure or test/unavailable (cardiac cath).  Wyona Almas, PT, DPT Acute Rehabilitation Services Pager [phone] Office [phone]    Deno Etienne 10/21/2020, 8:07 AM

## 2020-10-22 ENCOUNTER — Encounter: Payer: Self-pay | Admitting: Physician Assistant

## 2020-10-22 ENCOUNTER — Encounter (HOSPITAL_COMMUNITY)
Admission: EM | Disposition: A | Discharge status: Home / Self Care | Payer: Self-pay | Source: Home / Self Care | Attending: Cardiovascular Disease

## 2020-10-22 ENCOUNTER — Encounter (HOSPITAL_COMMUNITY): Payer: Self-pay | Admitting: Internal Medicine

## 2020-10-22 ENCOUNTER — Inpatient Hospital Stay (HOSPITAL_COMMUNITY): Payer: Medicare Other | Admitting: Anesthesiology

## 2020-10-22 ENCOUNTER — Inpatient Hospital Stay (HOSPITAL_COMMUNITY): Payer: Medicare Other

## 2020-10-22 ENCOUNTER — Telehealth: Payer: Self-pay | Admitting: Cardiovascular Disease

## 2020-10-22 ENCOUNTER — Other Ambulatory Visit: Payer: Self-pay | Admitting: Physician Assistant

## 2020-10-22 DIAGNOSIS — Z66 Do not resuscitate: Secondary | ICD-10-CM | POA: Diagnosis not present

## 2020-10-22 DIAGNOSIS — Z006 Encounter for examination for normal comparison and control in clinical research program: Secondary | ICD-10-CM

## 2020-10-22 DIAGNOSIS — Z952 Presence of prosthetic heart valve: Secondary | ICD-10-CM

## 2020-10-22 DIAGNOSIS — Z20822 Contact with and (suspected) exposure to covid-19: Secondary | ICD-10-CM | POA: Diagnosis not present

## 2020-10-22 DIAGNOSIS — I083 Combined rheumatic disorders of mitral, aortic and tricuspid valves: Secondary | ICD-10-CM | POA: Diagnosis not present

## 2020-10-22 DIAGNOSIS — I350 Nonrheumatic aortic (valve) stenosis: Secondary | ICD-10-CM

## 2020-10-22 HISTORY — DX: Presence of prosthetic heart valve: Z95.2

## 2020-10-22 HISTORY — PX: TEE WITHOUT CARDIOVERSION: SHX5443

## 2020-10-22 HISTORY — PX: TRANSCATHETER AORTIC VALVE REPLACEMENT, TRANSFEMORAL: SHX6400

## 2020-10-22 LAB — COMPREHENSIVE METABOLIC PANEL
ALT: 28 U/L (ref 0–44)
AST: 33 U/L (ref 15–41)
Albumin: 2.9 g/dL — ABNORMAL LOW (ref 3.5–5.0)
Alkaline Phosphatase: 58 U/L (ref 38–126)
Anion gap: 10 (ref 5–15)
BUN: 30 mg/dL — ABNORMAL HIGH (ref 8–23)
CO2: 22 mmol/L (ref 22–32)
Calcium: 8.8 mg/dL — ABNORMAL LOW (ref 8.9–10.3)
Chloride: 104 mmol/L (ref 98–111)
Creatinine, Ser: 1.48 mg/dL — ABNORMAL HIGH (ref 0.61–1.24)
GFR, Estimated: 45 mL/min — ABNORMAL LOW (ref >60)
Glucose, Bld: 104 mg/dL — ABNORMAL HIGH (ref 70–99)
Potassium: 4.6 mmol/L (ref 3.5–5.1)
Sodium: 136 mmol/L (ref 135–145)
Total Bilirubin: 1.2 mg/dL (ref 0.3–1.2)
Total Protein: 6.1 g/dL — ABNORMAL LOW (ref 6.5–8.1)

## 2020-10-22 LAB — POCT I-STAT, CHEM 8
BUN: 30 mg/dL — ABNORMAL HIGH (ref 8–23)
BUN: 29 mg/dL — ABNORMAL HIGH (ref 8–23)
BUN: 31 mg/dL — ABNORMAL HIGH (ref 8–23)
Calcium, Ion: 1.3 mmol/L (ref 1.15–1.40)
Calcium, Ion: 1.23 mmol/L (ref 1.15–1.40)
Calcium, Ion: 1.26 mmol/L (ref 1.15–1.40)
Chloride: 103 mmol/L (ref 98–111)
Chloride: 103 mmol/L (ref 98–111)
Chloride: 104 mmol/L (ref 98–111)
Creatinine, Ser: 1.3 mg/dL — ABNORMAL HIGH (ref 0.61–1.24)
Creatinine, Ser: 1.3 mg/dL — ABNORMAL HIGH (ref 0.61–1.24)
Creatinine, Ser: 1.4 mg/dL — ABNORMAL HIGH (ref 0.61–1.24)
Glucose, Bld: 107 mg/dL — ABNORMAL HIGH (ref 70–99)
Glucose, Bld: 111 mg/dL — ABNORMAL HIGH (ref 70–99)
Glucose, Bld: 113 mg/dL — ABNORMAL HIGH (ref 70–99)
HCT: 37 % — ABNORMAL LOW (ref 39.0–52.0)
HCT: 32 % — ABNORMAL LOW (ref 39.0–52.0)
HCT: 34 % — ABNORMAL LOW (ref 39.0–52.0)
Hemoglobin: 12.6 g/dL — ABNORMAL LOW (ref 13.0–17.0)
Hemoglobin: 10.9 g/dL — ABNORMAL LOW (ref 13.0–17.0)
Hemoglobin: 11.6 g/dL — ABNORMAL LOW (ref 13.0–17.0)
Potassium: 4.7 mmol/L (ref 3.5–5.1)
Potassium: 5.2 mmol/L — ABNORMAL HIGH (ref 3.5–5.1)
Potassium: 5.3 mmol/L — ABNORMAL HIGH (ref 3.5–5.1)
Sodium: 137 mmol/L (ref 135–145)
Sodium: 136 mmol/L (ref 135–145)
Sodium: 136 mmol/L (ref 135–145)
TCO2: 24 mmol/L (ref 22–32)
TCO2: 22 mmol/L (ref 22–32)
TCO2: 24 mmol/L (ref 22–32)

## 2020-10-22 LAB — HEMOGLOBIN A1C
Hgb A1c MFr Bld: 5.1 % (ref 4.8–5.6)
Mean Plasma Glucose: 99.67 mg/dL

## 2020-10-22 LAB — APTT: aPTT: 35 seconds (ref 24–36)

## 2020-10-22 LAB — PROTIME-INR
INR: 1.1 (ref 0.8–1.2)
Prothrombin Time: 14.1 seconds (ref 11.4–15.2)

## 2020-10-22 LAB — ECHOCARDIOGRAM LIMITED
AR max vel: 2.61 cm2
AV Area VTI: 2.86 cm2
AV Area mean vel: 2.48 cm2
AV Mean grad: 12.7 mmHg
AV Peak grad: 16 mmHg
Ao pk vel: 2 m/s
Height: 73 in
Weight: 2712 oz

## 2020-10-22 SURGERY — IMPLANTATION, AORTIC VALVE, TRANSCATHETER, FEMORAL APPROACH
Anesthesia: Monitor Anesthesia Care | Site: Chest | Laterality: Right

## 2020-10-22 MED ORDER — LIDOCAINE 2% (20 MG/ML) 5 ML SYRINGE
INTRAMUSCULAR | Status: AC
  Filled 2020-10-22: qty 5

## 2020-10-22 MED ORDER — OXYCODONE HCL 5 MG PO TABS: 5.0000 mg | ORAL_TABLET | ORAL | Status: DC | PRN

## 2020-10-22 MED ORDER — SODIUM CHLORIDE 0.9 % IV SOLN
1.5000 g | Freq: Two times a day (BID) | INTRAVENOUS | Status: DC
  Administered 2020-10-22 – 2020-10-23 (×2): 1.5 g via INTRAVENOUS
  Filled 2020-10-22 (×5): qty 1.5

## 2020-10-22 MED ORDER — IODIXANOL 320 MG/ML IV SOLN
INTRAVENOUS | Status: DC | PRN
  Administered 2020-10-22: 56 mL

## 2020-10-22 MED ORDER — FENTANYL CITRATE (PF) 250 MCG/5ML IJ SOLN
INTRAMUSCULAR | Status: DC | PRN
  Administered 2020-10-22: 100 ug via INTRAVENOUS

## 2020-10-22 MED ORDER — LIDOCAINE HCL 1 % IJ SOLN
INTRAMUSCULAR | Status: DC | PRN
  Administered 2020-10-22: 11 mL

## 2020-10-22 MED ORDER — SODIUM CHLORIDE 0.9% FLUSH
3.0000 mL | Freq: Two times a day (BID) | INTRAVENOUS | Status: DC
  Administered 2020-10-22 – 2020-10-23 (×2): 3 mL via INTRAVENOUS

## 2020-10-22 MED ORDER — MORPHINE SULFATE (PF) 2 MG/ML IV SOLN: 1.0000 mg | INTRAVENOUS | Status: DC | PRN

## 2020-10-22 MED ORDER — LIDOCAINE HCL 1 % IJ SOLN
INTRAMUSCULAR | Status: AC
  Filled 2020-10-22: qty 20

## 2020-10-22 MED ORDER — SODIUM CHLORIDE 0.9 % IV SOLN
INTRAVENOUS | Status: AC
Start: 2020-10-22 — End: 2020-10-22

## 2020-10-22 MED ORDER — EPHEDRINE SULFATE-NACL 50-0.9 MG/10ML-% IV SOSY
PREFILLED_SYRINGE | INTRAVENOUS | Status: DC | PRN
  Administered 2020-10-22 (×2): 10 mg via INTRAVENOUS

## 2020-10-22 MED ORDER — HEPARIN SODIUM (PORCINE) 1000 UNIT/ML IJ SOLN
INTRAMUSCULAR | Status: DC | PRN
  Administered 2020-10-22: 12000 [IU] via INTRAVENOUS

## 2020-10-22 MED ORDER — LACTATED RINGERS IV SOLN: INTRAVENOUS | Status: DC | PRN

## 2020-10-22 MED ORDER — ASPIRIN 81 MG PO CHEW
81.0000 mg | CHEWABLE_TABLET | Freq: Every day | ORAL | Status: DC
  Administered 2020-10-23: 81 mg via ORAL
  Filled 2020-10-22: qty 1

## 2020-10-22 MED ORDER — ONDANSETRON HCL 4 MG/2ML IJ SOLN: 4.0000 mg | Freq: Four times a day (QID) | INTRAMUSCULAR | Status: DC | PRN

## 2020-10-22 MED ORDER — DEXMEDETOMIDINE HCL IN NACL 400 MCG/100ML IV SOLN
INTRAVENOUS | Status: DC | PRN
  Administered 2020-10-22: 1 ug/kg/h via INTRAVENOUS

## 2020-10-22 MED ORDER — MIDAZOLAM HCL 2 MG/2ML IJ SOLN
INTRAMUSCULAR | Status: DC | PRN
  Administered 2020-10-22: 1 mg via INTRAVENOUS

## 2020-10-22 MED ORDER — SUCCINYLCHOLINE CHLORIDE 200 MG/10ML IV SOSY
PREFILLED_SYRINGE | INTRAVENOUS | Status: AC
  Filled 2020-10-22: qty 10

## 2020-10-22 MED ORDER — PROPOFOL 10 MG/ML IV BOLUS
INTRAVENOUS | Status: AC
  Filled 2020-10-22: qty 20

## 2020-10-22 MED ORDER — ACETAMINOPHEN 650 MG RE SUPP: 650.0000 mg | Freq: Four times a day (QID) | RECTAL | Status: DC | PRN

## 2020-10-22 MED ORDER — CHLORHEXIDINE GLUCONATE 0.12 % MT SOLN: 15.0000 mL | Freq: Once | OROMUCOSAL | Status: DC

## 2020-10-22 MED ORDER — ACETAMINOPHEN 500 MG PO TABS: 1000.0000 mg | ORAL_TABLET | Freq: Once | ORAL | Status: DC

## 2020-10-22 MED ORDER — SODIUM CHLORIDE 0.9 % IV SOLN: 250.0000 mL | INTRAVENOUS | Status: DC | PRN

## 2020-10-22 MED ORDER — VANCOMYCIN HCL IN DEXTROSE 1-5 GM/200ML-% IV SOLN
1000.0000 mg | Freq: Once | INTRAVENOUS | Status: AC
  Administered 2020-10-22: 1000 mg via INTRAVENOUS
  Filled 2020-10-22: qty 200

## 2020-10-22 MED ORDER — SODIUM CHLORIDE 0.9 % IV SOLN
INTRAVENOUS | Status: AC
  Filled 2020-10-22 (×3): qty 1.2

## 2020-10-22 MED ORDER — FENTANYL CITRATE (PF) 250 MCG/5ML IJ SOLN
INTRAMUSCULAR | Status: AC
  Filled 2020-10-22: qty 5

## 2020-10-22 MED ORDER — PHENYLEPHRINE HCL-NACL 20-0.9 MG/250ML-% IV SOLN
0.0000 ug/min | INTRAVENOUS | Status: DC
  Administered 2020-10-22: 10 ug/min via INTRAVENOUS
  Filled 2020-10-22: qty 250

## 2020-10-22 MED ORDER — ACETAMINOPHEN 325 MG PO TABS: 650.0000 mg | ORAL_TABLET | Freq: Four times a day (QID) | ORAL | Status: DC | PRN

## 2020-10-22 MED ORDER — CHLORHEXIDINE GLUCONATE CLOTH 2 % EX PADS: 6.0000 | MEDICATED_PAD | Freq: Once | CUTANEOUS | Status: DC

## 2020-10-22 MED ORDER — PHENYLEPHRINE 40 MCG/ML (10ML) SYRINGE FOR IV PUSH (FOR BLOOD PRESSURE SUPPORT)
PREFILLED_SYRINGE | INTRAVENOUS | Status: DC | PRN
  Administered 2020-10-22: 80 ug via INTRAVENOUS

## 2020-10-22 MED ORDER — SODIUM CHLORIDE 0.9 % IV SOLN
INTRAVENOUS | Status: AC
  Filled 2020-10-22: qty 1.2

## 2020-10-22 MED ORDER — MIDAZOLAM HCL 2 MG/2ML IJ SOLN
INTRAMUSCULAR | Status: AC
  Filled 2020-10-22: qty 2

## 2020-10-22 MED ORDER — TRAMADOL HCL 50 MG PO TABS
50.0000 mg | ORAL_TABLET | ORAL | Status: DC | PRN
  Administered 2020-10-22: 50 mg via ORAL
  Filled 2020-10-22: qty 1

## 2020-10-22 MED ORDER — SODIUM CHLORIDE 0.9% FLUSH: 3.0000 mL | INTRAVENOUS | Status: DC | PRN

## 2020-10-22 MED ORDER — NITROGLYCERIN IN D5W 200-5 MCG/ML-% IV SOLN: 0.0000 ug/min | INTRAVENOUS | Status: DC

## 2020-10-22 MED ORDER — SODIUM CHLORIDE 0.9 % IV SOLN
INTRAVENOUS | Status: DC | PRN
  Administered 2020-10-22: 1500 mL

## 2020-10-22 SURGICAL SUPPLY — 85 items
BAG DECANTER FOR FLEXI CONT (MISCELLANEOUS) IMPLANT
BAG SNAP BAND KOVER 36X36 (MISCELLANEOUS) ×4 IMPLANT
BLADE CLIPPER SURG (BLADE) IMPLANT
BLADE OSCILLATING /SAGITTAL (BLADE) IMPLANT
BLADE STERNUM SYSTEM 6 (BLADE) IMPLANT
BLADE SURG 10 STRL SS (BLADE) IMPLANT
CABLE ADAPT CONN TEMP 6FT (ADAPTER) ×4 IMPLANT
CATH DIAG 6FR PIGTAIL (CATHETERS) ×4 IMPLANT
CATH DIAG EXPO 6F AL1 (CATHETERS) IMPLANT
CATH DIAG EXPO 6F VENT PIG 145 (CATHETERS) ×8 IMPLANT
CATH EXTERNAL FEMALE PUREWICK (CATHETERS) IMPLANT
CATH INFINITI 6F AL2 (CATHETERS) IMPLANT
CATH S G BIP PACING (CATHETERS) ×4 IMPLANT
CHLORAPREP W/TINT 26 (MISCELLANEOUS) ×4 IMPLANT
CLIP VESOCCLUDE MED 24/CT (CLIP) IMPLANT
CLIP VESOCCLUDE SM WIDE 24/CT (CLIP) IMPLANT
CLOSURE MYNX CONTROL 6F/7F (Vascular Products) ×4 IMPLANT
CLOSURE PERCLOSE PROSTYLE (VASCULAR PRODUCTS) ×4 IMPLANT
CNTNR URN SCR LID CUP LEK RST (MISCELLANEOUS) ×4 IMPLANT
CONT SPEC 4OZ STRL OR WHT (MISCELLANEOUS) ×8
COVER BACK TABLE 80X110 HD (DRAPES) ×4 IMPLANT
COVER WAND RF STERILE (DRAPES) ×4 IMPLANT
DECANTER SPIKE VIAL GLASS SM (MISCELLANEOUS) ×4 IMPLANT
DERMABOND ADVANCED (GAUZE/BANDAGES/DRESSINGS) ×2
DERMABOND ADVANCED .7 DNX12 (GAUZE/BANDAGES/DRESSINGS) ×2 IMPLANT
DEVICE CLOSURE PERCLS PRGLD 6F (VASCULAR PRODUCTS) ×4 IMPLANT
DRAPE INCISE IOBAN 66X45 STRL (DRAPES) IMPLANT
DRSG TEGADERM 4X10 (GAUZE/BANDAGES/DRESSINGS) ×4 IMPLANT
DRSG TEGADERM 4X4.75 (GAUZE/BANDAGES/DRESSINGS) ×8 IMPLANT
ELECT CAUTERY BLADE 6.4 (BLADE) IMPLANT
ELECT REM PT RETURN 9FT ADLT (ELECTROSURGICAL) ×8
ELECTRODE REM PT RTRN 9FT ADLT (ELECTROSURGICAL) ×4 IMPLANT
FELT TEFLON 6X6 (MISCELLANEOUS) IMPLANT
GAUZE SPONGE 4X4 12PLY STRL (GAUZE/BANDAGES/DRESSINGS) ×4 IMPLANT
GLOVE BIO SURGEON STRL SZ7.5 (GLOVE) ×4 IMPLANT
GLOVE BIO SURGEON STRL SZ8 (GLOVE) IMPLANT
GLOVE EUDERMIC 7 POWDERFREE (GLOVE) IMPLANT
GLOVE ORTHO TXT STRL SZ7.5 (GLOVE) IMPLANT
GOWN STRL REUS W/ TWL LRG LVL3 (GOWN DISPOSABLE) IMPLANT
GOWN STRL REUS W/ TWL XL LVL3 (GOWN DISPOSABLE) ×2 IMPLANT
GOWN STRL REUS W/TWL LRG LVL3 (GOWN DISPOSABLE)
GOWN STRL REUS W/TWL XL LVL3 (GOWN DISPOSABLE) ×4
GUIDEWIRE SAFE TJ AMPLATZ EXST (WIRE) ×4 IMPLANT
INSERT FOGARTY SM (MISCELLANEOUS) IMPLANT
KIT BASIN OR (CUSTOM PROCEDURE TRAY) ×4 IMPLANT
KIT HEART LEFT (KITS) ×4 IMPLANT
KIT SUCTION CATH 14FR (SUCTIONS) IMPLANT
KIT TURNOVER KIT B (KITS) ×4 IMPLANT
LOOP VESSEL MAXI BLUE (MISCELLANEOUS) IMPLANT
LOOP VESSEL MINI RED (MISCELLANEOUS) IMPLANT
NS IRRIG 1000ML POUR BTL (IV SOLUTION) ×4 IMPLANT
PACK ENDO MINOR (CUSTOM PROCEDURE TRAY) ×4 IMPLANT
PAD ARMBOARD 7.5X6 YLW CONV (MISCELLANEOUS) ×8 IMPLANT
PAD ELECT DEFIB RADIOL ZOLL (MISCELLANEOUS) ×4 IMPLANT
PENCIL BUTTON HOLSTER BLD 10FT (ELECTRODE) IMPLANT
PERCLOSE PROGLIDE 6F (VASCULAR PRODUCTS) ×8
POSITIONER HEAD DONUT 9IN (MISCELLANEOUS) ×4 IMPLANT
SET MICROPUNCTURE 5F STIFF (MISCELLANEOUS) ×4 IMPLANT
SHEATH BRITE TIP 7FR 35CM (SHEATH) ×4 IMPLANT
SHEATH PINNACLE 6F 10CM (SHEATH) ×4 IMPLANT
SHEATH PINNACLE 8F 10CM (SHEATH) ×4 IMPLANT
SLEEVE REPOSITIONING LENGTH 30 (MISCELLANEOUS) ×4 IMPLANT
STOPCOCK MORSE 400PSI 3WAY (MISCELLANEOUS) ×8 IMPLANT
SUT ETHIBOND X763 2 0 SH 1 (SUTURE) IMPLANT
SUT GORETEX CV 4 TH 22 36 (SUTURE) IMPLANT
SUT GORETEX CV4 TH-18 (SUTURE) IMPLANT
SUT MNCRL AB 3-0 PS2 18 (SUTURE) IMPLANT
SUT PROLENE 5 0 C 1 36 (SUTURE) IMPLANT
SUT PROLENE 6 0 C 1 30 (SUTURE) IMPLANT
SUT SILK  1 MH (SUTURE) ×4
SUT SILK 1 MH (SUTURE) ×2 IMPLANT
SUT VIC AB 2-0 CT1 27 (SUTURE)
SUT VIC AB 2-0 CT1 TAPERPNT 27 (SUTURE) IMPLANT
SUT VIC AB 2-0 CTX 36 (SUTURE) IMPLANT
SUT VIC AB 3-0 SH 8-18 (SUTURE) IMPLANT
SYR 50ML LL SCALE MARK (SYRINGE) ×4 IMPLANT
SYR BULB IRRIG 60ML STRL (SYRINGE) IMPLANT
SYR MEDRAD MARK V 150ML (SYRINGE) ×4 IMPLANT
TOWEL GREEN STERILE (TOWEL DISPOSABLE) ×8 IMPLANT
TRANSDUCER W/STOPCOCK (MISCELLANEOUS) ×8 IMPLANT
TRAY FOLEY SLVR 16FR TEMP STAT (SET/KITS/TRAYS/PACK) IMPLANT
VALVE HEART TRANSCATH SZ3 29MM (Valve) ×4 IMPLANT
WIRE EMERALD 3MM-J .035X150CM (WIRE) ×4 IMPLANT
WIRE EMERALD 3MM-J .035X260CM (WIRE) ×4 IMPLANT
WIRE TORQFLEX AUST .018X40CM (WIRE) ×8 IMPLANT

## 2020-10-22 NOTE — Progress Notes (Signed)
° °   °  [address]       [place] [address]             [phone]     CARDIOTHORACIC SURGERY PROGRESS NOTE  Subjective: Cesar [name] has been scheduled for TRANSCATHETER AORTIC VALVE REPLACEMENT today.   Objective: Vital signs in last 24 hours: Temp:  [97.7 F (36.5 C)-98.5 F (36.9 C)] 97.7 F (36.5 C) (01/18 0527) Pulse Rate:  [63-112] 69 (01/18 0527) Cardiac Rhythm: Atrial fibrillation (01/17 2002) Resp:  [14-27] 20 (01/18 0527) BP: (125-155)/(67-107) 149/107 (01/18 0527) SpO2:  [94 %-100 %] 99 % (01/18 0527)  Physical Exam: Unchanged from previously   Intake/Output from previous day: 01/17 0701 - 01/18 0700 In: 600 [P.O.:600] Out: 1250 [Urine:1250] Intake/Output this shift: Total I/O In: 240 [P.O.:240] Out: 1250 [Urine:1250]  Lab Results: Recent Labs    10/20/20 0337 10/21/20 0311 10/21/20 0800 10/21/20 0804  WBC 5.9 5.8  --   --   HGB 13.6 13.2 12.9* 12.9*  HCT 41.5 40.3 38.0* 38.0*  PLT 131* 136*  --   --    BMET:  Recent Labs    10/21/20 0311 10/21/20 0800 10/21/20 0804 10/22/20 0456  NA 135   < > 136 136  K 4.6   < > 4.6 4.6  CL 101  --   --  104  CO2 25  --   --  22  GLUCOSE 103*  --   --  104*  BUN 27*  --   --  30*  CREATININE 1.50*  --   --  1.48*  CALCIUM 8.8*  --   --  8.8*   < > = values in this interval not displayed.    CBG (last 3)  Recent Labs    10/20/20 0358  GLUCAP 106*   PT/INR:   Recent Labs    10/22/20 0456  LABPROT 14.1  INR 1.1    Assessment/Plan:   The various methods of treatment have been discussed with the patient. After consideration of the risks, benefits and treatment options the patient has consented to the planned procedure.   The patient has been seen and labs reviewed. There are no changes in the patients condition to prevent proceeding with the planned procedure today.   Rexene Alberts, MD 10/22/2020 6:29 AM

## 2020-10-22 NOTE — Progress Notes (Addendum)
Pt belongings packs, CHG completed with new gown and full linen change. Called report down to OR, confirmed consent in chart.  Edit: Noted that pt has his watch on, was sent down with him

## 2020-10-22 NOTE — Progress Notes (Signed)
°  Echocardiogram 2D Echocardiogram has been performed.  Matilde Bash 10/22/2020, 9:31 AM

## 2020-10-22 NOTE — Progress Notes (Addendum)
°  [place] VALVE TEAM  Patient doing well s/p TAVR. He is hemodynamically stable now after 1L NS and short period of neo. Groin sites stable. ECG with new LBBB but no high grade block. Plan for arterial line to be discontinued and transferred  to 4E. Plan for early ambulation after bedrest completed and hopeful discharge over the next 24-48 hours.   Angelena Form PA-C  [facility]  Pager [phone]

## 2020-10-22 NOTE — Progress Notes (Signed)
PT Cancellation Note  Patient Details Name: Cesar [name] MRN: [id number] DOB: 1929/01/22   Cancelled Treatment:    Reason Eval/Treat Not Completed: Patient at procedure or test/unavailable (TAVR).  Wyona Almas, PT, DPT Acute Rehabilitation Services Pager [phone] Office [phone]    Deno Etienne 10/22/2020, 7:47 AM

## 2020-10-22 NOTE — Anesthesia Procedure Notes (Signed)
Arterial Line Insertion Start/End1/18/2022 7:00 AM, 10/22/2020 7:10 AM Performed by: Lance Coon, CRNA, CRNA  Preanesthetic checklist: patient identified, IV checked, site marked, risks and benefits discussed, surgical consent, monitors and equipment checked, pre-op evaluation, timeout performed and anesthesia consent Lidocaine 1% used for infiltration Left, radial was placed Catheter size: 20 G Maximum sterile barriers used   Attempts: 1 Procedure performed without using ultrasound guided technique. Following insertion, dressing applied and Biopatch. Post procedure assessment: normal and unchanged  Patient tolerated the procedure well with no immediate complications.

## 2020-10-22 NOTE — Anesthesia Postprocedure Evaluation (Signed)
Anesthesia Post Note  Patient: Cesar [name]  Procedure(s) Performed: TRANSCATHETER AORTIC VALVE REPLACEMENT, TRANSFEMORAL (Right Chest) TRANSESOPHAGEAL ECHOCARDIOGRAM (TEE) (N/A )     Patient location during evaluation: PACU Anesthesia Type: MAC Level of consciousness: awake and alert Pain management: pain level controlled Vital Signs Assessment: post-procedure vital signs reviewed and stable Respiratory status: spontaneous breathing, nonlabored ventilation, respiratory function stable and patient connected to nasal cannula oxygen Cardiovascular status: stable and blood pressure returned to baseline Postop Assessment: no apparent nausea or vomiting Anesthetic complications: no   No complications documented.  Last Vitals:  Vitals:   10/22/20 1014 10/22/20 1110  BP: (!) 97/43 (!) 85/37  Pulse: (!) 52 (!) 54  Resp: 12 17  Temp: (!) 36.2 C (!) 36.4 C  SpO2:      Last Pain:  Vitals:   10/22/20 1110  TempSrc: Temporal  PainSc: 0-No pain                 Catalina Gravel

## 2020-10-22 NOTE — Transfer of Care (Signed)
Immediate Anesthesia Transfer of Care Note  Patient: Cesar [name]  Procedure(s) Performed: TRANSCATHETER AORTIC VALVE REPLACEMENT, TRANSFEMORAL (Right Chest) TRANSESOPHAGEAL ECHOCARDIOGRAM (TEE) (N/A )  Patient Location: Cath Lab  Anesthesia Type:MAC  Level of Consciousness: drowsy and patient cooperative  Airway & Oxygen Therapy: Patient Spontanous Breathing  Post-op Assessment: Report given to RN and Post -op Vital signs reviewed and stable  Post vital signs: Reviewed and stable  Last Vitals:  Vitals Value Taken Time  BP 97/43 10/22/20 1011  Temp    Pulse 56 10/22/20 1015  Resp 15 10/22/20 1015  SpO2 98 % 10/22/20 1015  Vitals shown include unvalidated device data.  Last Pain:  Vitals:   10/22/20 0527  TempSrc: Oral  PainSc:          Complications: No complications documented.

## 2020-10-22 NOTE — Progress Notes (Signed)
Mobility Specialist: Progress Note   10/22/20 1858  Mobility  Activity Ambulated in hall  Level of Assistance Moderate assist, patient does 50-74%  Assistive Device Front wheel walker  Distance Ambulated (ft) 100 ft  Mobility Response Tolerated well  Mobility performed by Mobility specialist  $Mobility charge 1 Mobility   Pre-Mobility: 90 HR, 158/67 BP, 99% SpO2 During Mobility: 123 HR Post-Mobility: 84 HR, 100% SpO2  Pt was asx during ambulation. Pt hard of hearing and required frequent verbal cues for RW placement during ambulation and to maneuver to BR after walk. Pt educated to pull call bell when finished but pt got up on his own anyway. Pt pulled condom cath off. Floor cleaned up and pt back to bed.   [facility] Samra Pesch Mobility Specialist

## 2020-10-22 NOTE — Telephone Encounter (Signed)
     Patient calling the office for samples of medication:   1.  What medication and dosage are you requesting samples for?apixaban (ELIQUIS) 5 MG TABS tablet  2.  Are you currently out of this medication? Yes  Caren Griffins with [facility] would like to know if we have samples available for eliquis for the pt

## 2020-10-22 NOTE — CV Procedure (Signed)
[facility]  TAVR OPERATIVE NOTE   Date of Procedure:  10/22/2020  Preoperative Diagnosis: Severe Aortic Stenosis   Postoperative Diagnosis: Same   Procedure:    Transcatheter Aortic Valve Replacement - Transfemoral Approach  Edwards Sapien 3 THV (size 29 mm, model # [id number], serial # [id number])   Co-Surgeons:  [name], MD and Valentina Gu. Roxy Manns, MD   Anesthesiologist:  Gifford Shave  Echocardiographer:  Johnsie Cancel  Pre-operative Echo Findings:  Severe aortic stenosis  Normal left ventricular systolic function  Post-operative Echo Findings:  No paravalvular leak  Normal left ventricular systolic function  BRIEF CLINICAL NOTE AND INDICATIONS FOR SURGERY  87 yo male with history of permanent atrial fibrillation on Eliquis, severe aortic stenosis, arterial thrombosis, AAA, prior CVA, CKD stage 3, hyperlipidemia, sleep apnea and thoracic aortic aneurysm admitted with syncope. TAVR today. He has been followed for moderate aortic valve stenosis by Dr. [name]. Echo in October 2020 with moderate AS (mean gradient 26 mHg, peak gradient 41 mmHg, AVA 0.99 cm2). LVEF 60-65% at that time. His ascending thoracic aortic aneurysm has been followed by Dr. [name] and was last seen by Dr. [name] in 2020. Last chest CTA in February 2020 with 4.5 cm aneurysm of the ascending aorta. No plans for follow up CTA chest given his age. 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 He was admitted with 10/16/20 after a syncopal episode. Echo 10/17/20 with LVEF=50-55%, no wall motion abnormalities. Mild reduction RV systolic function. Severe bi-atrial enlargement. Mild mitral regurgitation with severe MAC. The  aortic valve leaflets are thickened and calcified with severe aortic stenosis, mild to moderate aortic insufficiency. Mean gradient 37 mmHg, AVA 0.4cm2, dimensionless index 0.14. Cardiac cath with no obstructive CAD.   During the course of the patient's preoperative work up they have been evaluated comprehensively by a multidisciplinary team of specialists coordinated through the [facility] in the [facility].  They have been demonstrated to suffer from symptomatic severe aortic stenosis as noted above. The patient has been counseled extensively as to the relative risks and benefits of all options for the treatment of severe aortic stenosis including long term medical therapy, conventional surgery for aortic valve replacement, and transcatheter aortic valve replacement.  The patient has been independently evaluated by Dr. Roxy Manns with CT surgery and they are felt to be at high risk for conventional surgical aortic valve replacement. The surgeon indicated the patient would be a poor candidate for conventional surgery. Based upon review of all of the patient's preoperative diagnostic tests they are felt to be candidate for transcatheter aortic valve replacement using the transfemoral approach as an alternative to high risk conventional surgery.    Following the decision to proceed with transcatheter aortic valve replacement, a discussion has been held regarding what types of management strategies would be attempted intraoperatively in the event of life-threatening complications, including whether or not the patient would be considered a candidate for the use of cardiopulmonary bypass and/or conversion to open sternotomy for attempted surgical intervention.  The patient has been advised of a variety of complications that might develop peculiar to this approach including but not limited to risks of death, stroke, paravalvular leak, aortic dissection or other major  vascular complications, aortic  annulus rupture, device embolization, cardiac rupture or perforation, acute myocardial infarction, arrhythmia, heart block or bradycardia requiring permanent pacemaker placement, congestive heart failure, respiratory failure, renal failure, pneumonia, infection, other late complications related to structural valve deterioration or migration, or other complications that might ultimately cause a temporary or permanent loss of functional independence or other long term morbidity.  The patient provides full informed consent for the procedure as described and all questions were answered preoperatively.    DETAILS OF THE OPERATIVE PROCEDURE  PREPARATION:   The patient is brought to the operating room on the above mentioned date and central monitoring was established by the anesthesia team including placement of a radial arterial line. The patient is placed in the supine position on the operating table.  Intravenous antibiotics are administered. Conscious sedation is used.   Baseline transthoracic echocardiogram was performed. The patient's chest, abdomen, both groins, and both lower extremities are prepared and draped in a sterile manner. A time out procedure is performed.   PERIPHERAL ACCESS:   Using the modified Seldinger technique, femoral arterial and venous access were obtained with placement of a 6 Fr arterial sheath and 7 Fr venous sheath on the left side using u/s guidance.  A pigtail diagnostic catheter was passed through the femoral arterial sheath under fluoroscopic guidance into the aortic root.  A temporary transvenous pacemaker catheter was passed through the femoral venous sheath under fluoroscopic guidance into the right ventricle.  The pacemaker was tested to ensure stable lead placement and pacemaker capture. Aortic root angiography was performed in order to determine the optimal angiographic angle for valve deployment.  TRANSFEMORAL ACCESS:  A micropuncture  kit was used to gain access to the right femoral artery using u/s guidance. Position confirmed with angiography. Pre-closure with double ProGlide closure devices. The patient was heparinized systemically and ACT verified > 250 seconds.    A 16 Fr transfemoral E-sheath was introduced into the right femoral artery after progressively dilating over an Amplatz superstiff wire. An AL-2 catheter was used to direct a straight-tip exchange length wire across the native aortic valve into the left ventricle. This was exchanged out for a pigtail catheter and position was confirmed in the LV apex. Simultaneous LV and Ao pressures were recorded.  The pigtail catheter was then exchanged for an Amplatz Extra-stiff wire in the LV apex.   TRANSCATHETER HEART VALVE DEPLOYMENT:  An Edwards Sapien 3 THV (size 29 mm) was prepared and crimped per manufacturer's guidelines, and the proper orientation of the valve is confirmed on the [employer] delivery system. The valve was advanced through the introducer sheath using normal technique until in an appropriate position in the abdominal aorta beyond the sheath tip. The balloon was then retracted and using the fine-tuning wheel was centered on the valve. The valve was then advanced across the aortic arch using appropriate flexion of the catheter. The valve was carefully positioned across the aortic valve annulus. The Commander catheter was retracted using normal technique. Once final position of the valve has been confirmed by angiographic assessment, the valve is deployed while temporarily holding ventilation and during rapid ventricular pacing to maintain systolic blood pressure < 50 mmHg and pulse pressure < 10 mmHg. The balloon inflation is held for >3 seconds after reaching full deployment volume. Once the balloon has fully deflated the balloon is retracted into the ascending aorta and valve function is assessed using TTE. There is felt to be no paravalvular leak and no  central aortic insufficiency.  The patient's  hemodynamic recovery following valve deployment is good.  The deployment balloon and guidewire are both removed. Echo demostrated acceptable post-procedural gradients, stable mitral valve function, and no AI.   PROCEDURE COMPLETION:  The sheath was then removed and closure devices were completed. Protamine was administered once femoral arterial repair was complete. The temporary pacemaker, pigtail catheters and femoral sheaths were removed with a Mynx closure device placed in the artery and manual pressure used for venous hemostasis.    The patient tolerated the procedure well and is transported to the surgical intensive care in stable condition. There were no immediate intraoperative complications. All sponge instrument and needle counts are verified correct at completion of the operation.   No blood products were administered during the operation.  The patient received a total of 56 mL of intravenous contrast during the procedure.  [name] MD 10/22/2020 10:03 AM

## 2020-10-22 NOTE — Op Note (Signed)
[facility]   MULTIDISCIPLINARY HEART VALVE TEAM   TAVR OPERATIVE NOTE   Date of Procedure:  10/22/2020  Preoperative Diagnosis: Severe Aortic Stenosis   Postoperative Diagnosis: Same   Procedure:    Transcatheter Aortic Valve Replacement - Percutaneous Right Transfemoral Approach  Edwards Sapien 3 THV (size 29 mm, model # [id number], serial # [id number])   Co-Surgeons:  [name], MD and Valentina Gu. Roxy Manns, MD  Anesthesiologist:  Hoy Morn, MD  Echocardiographer:  Verlin Dike  Pre-operative Echo Findings:  Severe aortic stenosis  Normal left ventricular systolic function  Post-operative Echo Findings:  No paravalvular leak  Normal left ventricular systolic function   BRIEF CLINICAL NOTE AND INDICATIONS FOR SURGERY  Patient is an 87 year old male with history of aortic stenosis, permanent atrial fibrillation on long-term anticoagulation using Eliquis, multiple previous strokes, moderate fusiform aneurysmal enlargement of the ascending thoracic aorta followed by Dr. [name], abdominal aortic aneurysm, stage III chronic kidney disease, hyperlipidemia, and obstructive sleep apnea who was admitted to the hospital following a syncopal event and has been referred for surgical consultation to discuss treatment options for management of critical aortic stenosis.  Patient has long history of permanent atrial fibrillation for which she has been followed by Dr. [name]. He has had multiple embolic strokes in the past and remains anticoagulated using Eliquis. Previous echocardiograms have documented the presence of normal left ventricular systolic function with aortic stenosis that has slowly progressed over time. Most recent follow-up echocardiogram performed October 2020 revealed findings consistent with moderate aortic stenosis with mean transvalvular gradient estimated 26 mmHg and aortic valve area calculated 0.99 cm by VTI. The patient also has had  moderate fusiform aneurysmal enlargement of the thoracic aorta followed by Dr. [name] with maximum transverse diameter measured 4.5 cm by CT angiogram.  Diagnostic cardiac catheterization revealed non-obstructive CAD with normal right heart pressures and cardiac output.  During the course of the patient's preoperative work up they have been evaluated comprehensively by a multidisciplinary team of specialists coordinated through the [facility] in the [facility].  They have been demonstrated to suffer from symptomatic severe aortic stenosis as noted above. The patient has been counseled extensively as to the relative risks and benefits of all options for the treatment of severe aortic stenosis including long term medical therapy, conventional surgery for aortic valve replacement, and transcatheter aortic valve replacement.  All questions have been answered, and the patient provides full informed consent for the operation as described.   DETAILS OF THE OPERATIVE PROCEDURE  PREPARATION:    The patient is brought to the operating room on the above mentioned date and appropriate monitoring was established by the anesthesia team. The patient is placed in the supine position on the operating table.  Intravenous antibiotics are administered. The patient is monitored closely throughout the procedure under conscious sedation.  Baseline transthoracic echocardiogram was performed. The patient's chest, abdomen, both groins, and both lower extremities are prepared and draped in a sterile manner. A time out procedure is performed.   PERIPHERAL ACCESS:    Using the modified Seldinger technique, femoral arterial and venous access was obtained with placement of 6 Fr sheaths on the left side.  A pigtail diagnostic catheter was passed through the left arterial sheath under fluoroscopic guidance into the aortic root.  A temporary transvenous pacemaker catheter was  passed through the left femoral venous sheath under fluoroscopic guidance into the right ventricle.  The pacemaker was tested to ensure  stable lead placement and pacemaker capture. Aortic root angiography was performed in order to determine the optimal angiographic angle for valve deployment.   TRANSFEMORAL ACCESS:   Percutaneous transfemoral access and sheath placement was performed using ultrasound guidance.  The right common femoral artery was cannulated using a micropuncture needle and appropriate location was verified using hand injection angiogram.  A pair of Abbott Perclose percutaneous closure devices were placed and a 6 French sheath replaced into the femoral artery.  The patient was heparinized systemically and ACT verified > 250 seconds.    A 16 Fr transfemoral E-sheath was introduced into the right common femoral artery after progressively dilating over an Amplatz superstiff wire. An AL-2 catheter was used to direct a straight-tip exchange length wire across the native aortic valve into the left ventricle. This was exchanged out for a pigtail catheter and position was confirmed in the LV apex. Simultaneous LV and Ao pressures were recorded.  The pigtail catheter was exchanged for an Amplatz Extra-stiff wire in the LV apex.  Echocardiography was utilized to confirm appropriate wire position and no sign of entanglement in the mitral subvalvular apparatus.   TRANSCATHETER HEART VALVE DEPLOYMENT:   An Edwards Sapien 3 transcatheter heart valve (size 29 mm, model #9600TFX, serial #[id number]) was prepared and crimped per manufacturer's guidelines, and the proper orientation of the valve is confirmed on the [employer] delivery system. The valve was advanced through the introducer sheath using normal technique until in an appropriate position in the abdominal aorta beyond the sheath tip. The balloon was then retracted and using the fine-tuning wheel was centered on the valve. The valve was then  advanced across the aortic arch using appropriate flexion of the catheter. The valve was carefully positioned across the aortic valve annulus. The Commander catheter was retracted using normal technique. Once final position of the valve has been confirmed by angiographic assessment, the valve is deployed while temporarily holding ventilation and during rapid ventricular pacing to maintain systolic blood pressure < 50 mmHg and pulse pressure < 10 mmHg. The balloon inflation is held for >3 seconds after reaching full deployment volume. Once the balloon has fully deflated the balloon is retracted into the ascending aorta and valve function is assessed using echocardiography. There is felt to be no paravalvular leak and no central aortic insufficiency.  The patient's hemodynamic recovery following valve deployment is good.  The deployment balloon and guidewire are both removed.    PROCEDURE COMPLETION:   The sheath was removed and femoral artery closure performed.  Protamine was administered once femoral arterial repair was complete. The temporary pacemaker, pigtail catheters and femoral sheaths were removed with manual pressure used for hemostasis.  A Mynx femoral closure device was utilized following removal of the diagnostic sheath in the left femoral artery.  Pressure was held on the right groin for >30 minutes due to incomplete closure using Perclose, but ultimately there was satisfactory hemostasis.  The patient tolerated the procedure well and is transported to the surgical intensive care in stable condition. There were no immediate intraoperative complications. All sponge instrument and needle counts are verified correct at completion of the operation.   No blood products were administered during the operation.  The patient received a total of 56 mL of intravenous contrast during the procedure.   Rexene Alberts, MD 10/22/2020 9:42 AM

## 2020-10-23 ENCOUNTER — Other Ambulatory Visit: Payer: Self-pay

## 2020-10-23 ENCOUNTER — Encounter (HOSPITAL_COMMUNITY): Payer: Self-pay | Admitting: Cardiovascular Disease

## 2020-10-23 ENCOUNTER — Inpatient Hospital Stay (HOSPITAL_COMMUNITY): Payer: Medicare Other

## 2020-10-23 ENCOUNTER — Inpatient Hospital Stay (INDEPENDENT_AMBULATORY_CARE_PROVIDER_SITE_OTHER): Payer: Medicare Other

## 2020-10-23 DIAGNOSIS — I350 Nonrheumatic aortic (valve) stenosis: Secondary | ICD-10-CM | POA: Diagnosis not present

## 2020-10-23 DIAGNOSIS — I4891 Unspecified atrial fibrillation: Secondary | ICD-10-CM | POA: Diagnosis not present

## 2020-10-23 DIAGNOSIS — R55 Syncope and collapse: Secondary | ICD-10-CM

## 2020-10-23 DIAGNOSIS — I447 Left bundle-branch block, unspecified: Secondary | ICD-10-CM

## 2020-10-23 DIAGNOSIS — Z952 Presence of prosthetic heart valve: Secondary | ICD-10-CM

## 2020-10-23 DIAGNOSIS — L8990 Pressure ulcer of unspecified site, unspecified stage: Secondary | ICD-10-CM | POA: Insufficient documentation

## 2020-10-23 LAB — BASIC METABOLIC PANEL
Anion gap: 10 (ref 5–15)
Anion gap: 10 (ref 5–15)
BUN: 34 mg/dL — ABNORMAL HIGH (ref 8–23)
BUN: 37 mg/dL — ABNORMAL HIGH (ref 8–23)
CO2: 20 mmol/L — ABNORMAL LOW (ref 22–32)
CO2: 19 mmol/L — ABNORMAL LOW (ref 22–32)
Calcium: 8.3 mg/dL — ABNORMAL LOW (ref 8.9–10.3)
Calcium: 8.2 mg/dL — ABNORMAL LOW (ref 8.9–10.3)
Chloride: 104 mmol/L (ref 98–111)
Chloride: 104 mmol/L (ref 98–111)
Creatinine, Ser: 1.66 mg/dL — ABNORMAL HIGH (ref 0.61–1.24)
Creatinine, Ser: 1.83 mg/dL — ABNORMAL HIGH (ref 0.61–1.24)
GFR, Estimated: 39 mL/min — ABNORMAL LOW (ref >60)
GFR, Estimated: 35 mL/min — ABNORMAL LOW (ref >60)
Glucose, Bld: 129 mg/dL — ABNORMAL HIGH (ref 70–99)
Glucose, Bld: 127 mg/dL — ABNORMAL HIGH (ref 70–99)
Potassium: 5.3 mmol/L — ABNORMAL HIGH (ref 3.5–5.1)
Potassium: 5.4 mmol/L — ABNORMAL HIGH (ref 3.5–5.1)
Sodium: 134 mmol/L — ABNORMAL LOW (ref 135–145)
Sodium: 133 mmol/L — ABNORMAL LOW (ref 135–145)

## 2020-10-23 LAB — ECHOCARDIOGRAM COMPLETE
AR max vel: 2.72 cm2
AV Area VTI: 2.77 cm2
AV Area mean vel: 2.69 cm2
AV Mean grad: 11.5 mmHg
AV Peak grad: 21.9 mmHg
Ao pk vel: 2.34 m/s
Area-P 1/2: 3.48 cm2
Height: 73 in
P 1/2 time: 379 msec
S' Lateral: 3.2 cm
Weight: 2931.2 oz

## 2020-10-23 LAB — CBC
HCT: 35.8 % — ABNORMAL LOW (ref 39.0–52.0)
HCT: 33.7 % — ABNORMAL LOW (ref 39.0–52.0)
Hemoglobin: 11.6 g/dL — ABNORMAL LOW (ref 13.0–17.0)
Hemoglobin: 11.4 g/dL — ABNORMAL LOW (ref 13.0–17.0)
MCH: 31.5 pg (ref 26.0–34.0)
MCH: 32.6 pg (ref 26.0–34.0)
MCHC: 32.4 g/dL (ref 30.0–36.0)
MCHC: 33.8 g/dL (ref 30.0–36.0)
MCV: 97.3 fL (ref 80.0–100.0)
MCV: 96.3 fL (ref 80.0–100.0)
Platelets: 103 10*3/uL — ABNORMAL LOW (ref 150–400)
Platelets: 100 10*3/uL — ABNORMAL LOW (ref 150–400)
RBC: 3.68 MIL/uL — ABNORMAL LOW (ref 4.22–5.81)
RBC: 3.5 MIL/uL — ABNORMAL LOW (ref 4.22–5.81)
RDW: 13.3 % (ref 11.5–15.5)
RDW: 13.5 % (ref 11.5–15.5)
WBC: 8.3 10*3/uL (ref 4.0–10.5)
WBC: 9.5 10*3/uL (ref 4.0–10.5)
nRBC: 0 % (ref 0.0–0.2)
nRBC: 0 % (ref 0.0–0.2)

## 2020-10-23 LAB — MAGNESIUM: Magnesium: 1.9 mg/dL (ref 1.7–2.4)

## 2020-10-23 MED ORDER — ASPIRIN 81 MG PO CHEW: 81.0000 mg | CHEWABLE_TABLET | Freq: Every day | ORAL | 3 refills | Status: DC

## 2020-10-23 NOTE — Progress Notes (Signed)
°  Echocardiogram 2D Echocardiogram has been performed.  Randa Lynn Dalaya Suppa 10/23/2020, 12:28 PM

## 2020-10-23 NOTE — Care Management Important Message (Signed)
Important Message  Patient Details  Name: Cesar [name] MRN: [id number] Date of Birth: May 17, 1929   Medicare Important Message Given:  Yes     [name] 10/23/2020, 11:50 AM

## 2020-10-23 NOTE — Discharge Summary (Addendum)
Discharge Summary    Patient ID: Cesar [name] MRN: [id number]; DOB: 1928/12/24  Admit date: 10/16/2020 Discharge date: 10/23/2020  Primary Care Provider: No primary care provider on file.  Primary Cardiologist: [name], MD  Primary Electrophysiologist:  None   Discharge Diagnoses    Principal Problem:   S/P TAVR (transcatheter aortic valve replacement) Active Problems:   Syncope   Severe aortic stenosis   Pressure injury of skin    Diagnostic Studies/Procedures    Echocardiogram 10/23/20: 1. Left ventricular ejection fraction, by estimation, is 55%. The left  ventricle has normal function. The left ventricle has no regional wall  motion abnormalities. There is mild left ventricular hypertrophy. Left  ventricular diastolic parameters are  indeterminate.   2. Right ventricular systolic function is normal. The right ventricular  size is normal.   3. Left atrial size was severely dilated.   4. Right atrial size was moderately dilated.   5. The mitral valve is degenerative. Mild mitral valve regurgitation. No  evidence of mitral stenosis. Severe mitral annular calcification.   6. Post TAVR 10/22/20 with 29 mm Sapien 3 valve mean gradient 12 peak 22  mmHg with aVA 2.4 cm2 trivial PVL seen only on PSSA images at 3:00 . The  aortic valve has been repaired/replaced. Aortic valve regurgitation is  trivial. No aortic stenosis is  present. There is a 29 mm Sapien prosthetic (TAVR) valve present in the  aortic position. Procedure Date: 10/22/2020.   7. Aortic dilatation noted. There is moderate dilatation of the aortic  root, measuring 43 mm.   8. The inferior vena cava is normal in size with greater than 50%  respiratory variability, suggesting right atrial pressure of 3 mmHg.   Right/Left heart catheterization 10/21/20: Conclusions: Mild to moderate coronary artery disease, including 40-50% ostial LMCA, sequential 20% proximal and 10-20% distal LAD lesions, and 30% ostial  RCA stenosis. Upper normal left heart, right heart, and pulmonary artery pressures. Normal Fick cardiac output/index.   Recommendations: Ongoing workup of severe aortic stenosis per valve team. Medical therapy and risk factor modification to prevent progression of coronary artery disease. Restart heparin infusion 2 hours after TR band deflation.    Echocardiogram 10/17/20: 1. Left ventricular ejection fraction, by estimation, is 50 to 55%. The  left ventricle has low normal function. The left ventricle has no regional  wall motion abnormalities. There is moderate left ventricular hypertrophy.  Left ventricular diastolic  parameters are indeterminate.   2. Right ventricular systolic function is mildly reduced. The right  ventricular size is normal. There is normal pulmonary artery systolic  pressure. The estimated right ventricular systolic pressure is 46.2 mmHg.   3. Left atrial size was severely dilated.   4. Right atrial size was severely dilated.   5. The mitral valve is abnormal. Mild mitral valve regurgitation. Severe  mitral annular calcification.   6. The inferior vena cava is dilated in size with >50% respiratory  variability, suggesting right atrial pressure of 8 mmHg.   7. The aortic valve is calcified. There is severe calcifcation of the  aortic valve. Aortic valve regurgitation is mild to moderate. Severe  aortic valve stenosis. Vmax 4.1 m/s, MG 37 mmHg, AVA 0.4 cm^2, DI 0.14  _____________   History of Present Illness     Cesar [name] is a 87 y.o. 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 [name], obstructive sleep apnea not on CPAP, pulmonary nodules, hyperlipidemia, CKD stage III, and seizure disorder  who presented with a syncopal episode.   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 Remote nuclear stress test in 2003 was suggestive of lateral wall ischemia; however, no further ischemic work-up was pursued at the time due to lack of associated symptoms. Echo in 07/2019 showed LVEF of 60-65% with moderate LVH, severe biatrial enlargement, mild mitral regurgitation, and moderate aortic stenosis with mean gradient of 33 mmHg.    Patient also has known ascending aortic aneurysm which is followed by Dr. [name]. Last chest CT in 11/2018 showed stable tubular thoracic aorta measuring 4.5 x 4.4 cm (stable on examination dating back to 04/2015).    Patient was recently seen by Roby Lofts, PA-C, on 10/02/2020 at which time he was doing well from a cardiac standpoint. His wife reported occasional dyspnea on exertion but stated it did not occur often and was not lifestyle limiting. Overall, doing well from a cardiac standpoint. Echo was ordered for serial monitoring of aortic stenosis.    Patient reported to the ED 10/16/20 for further evaluation of syncope. Patient has been in his usual state of health. He reports he was working in his garage on his tractor and then got in a golf cart to ride back up to his house. The next then he remember he was waking up on the ground in his house with his wife standing over him. He does not remember getting out of the golf cart or walking into the house. He denies any prodromal symptoms including chest pain, shortness of breath, palpitations, lightheadedness, dizziness. He is not sure how long he was unconscious. He must have hit is head because he does have dried blood on his right ear. Patient  denies any bowel/bladder incontinence, tongue biting, or seizure like activity.     Wife was contacted and stated she did not find patient unconscious in the house. Patient reportedly walking up to the house from the golf cart, rang the doorbell, and then sat in the recliner. Patient then told wife that he had fallen. Wife asked if he hit his head and he said he thought so and thought he passed out. Wife noticed that right ear was bleeding and blue. Wife denies witnessing any seizure like activity. She does report that it seems like his dyspnea on exertion has worsened lately.    In the ED, patient hypertensive but otherwise vitals stable. EKG has not been done. Head CT showed no acute intracranial abnormality. Chest x-ray showed mild bibasilar atelectasis with small pleural effusions. WBC 6.0, Hgb 12.7, Plts 139. Na 139, K 4.7, Mg 2.2, Glucose 108, BUN 25, Cr 1.46. Albumin 3.4, AST 40, ALT 29, Alk Phos 61, Total Bili 1.3. Urinalysis showed trace leukocytes but otherwise unremarkable. Respiratory panel negative for COVID and influenza A/B. Patient admitted under Internal Medicine Service and Cardiology consulted for further evaluation.    At the time of this evaluation, patient is resting comfortably in no acute distress. He is currently asymptomatic  Hospital Course     Consultants: Structural heart   1. Severe aortic stenosis: patient  presented with a syncopal episode. Found to have progression of his aortic stenosis from moderate to severe. Seen by the structural heart team with plans to pursue TAVR work-up. He underwent [facility] 10/21/20 which showed mild-moderate non-obstructive CAD. Patient underwent TAVR 10/22/20. Post op EKG showed atrial fibrillation with new LBBB though no evidence of high grade heart block. Recommended for an outpatient 2 week zio patch monitor placed prior to discharge. Post op echocardiogram showed stable gradients. He was started on aspirin 74m for a 6 month course. SBE ppx  reviewed with patient. Close follow-up arranged with the structural heart team - Continue aspirin 82mdaily - Continue SBE ppx for dental work   2. Syncope: no significant arrhythmias this admission. Felt to be 2/2 #1. - Will check a 2 week zio monitor to rule out high-grade AV block as above   3. Chronic atrial fibrillation: rates well controlled without aid of AV nodal blocking agents. Eliquis held for TAVR with plans to restart on the evening of 10/23/20. .  - Continue eliquis for stroke ppx   4. Non-obstructive CAD: noted on cath today to have 45% LM, 20% pLAD, and 40% pRCA stenosis. Planning for 6 month course of aspirin following TAVR.  - Continue statin   5. HTN: BP stable this admission without antihypertensive medications - Continue to monitor outpatient   6. HLD: LDL well controlled at 47 this admission - Continue atorvastatin   7. CKD stage 3: Cr bumped a bit following TAVR to 1.83 on the day of discharge  - Would recheck BMET at follow-up - Continue to avoid nephrotoxic agents  8. Pancreatic lesion: incidental finding on CT A/P for TAVR work-up this admission. 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       _____________  Discharge Vitals Blood pressure (!) 114/50, pulse 72, temperature 98.6 F (37 C), temperature source Oral, resp. rate 20, height 6' 1"  (1.854 m), weight 83.1 kg, SpO2 95 %.  Filed Weights   10/20/20 0211 10/21/20 0526 10/23/20 0247  Weight: 83.3 kg 76.9 kg 83.1 kg    Labs & Radiologic Studies    CBC Recent Labs    10/23/20 0009 10/23/20 0150  WBC 8.3 9.5  HGB 11.6* 11.4*  HCT  35.8* 33.7*  MCV 97.3 96.3  PLT 103* 10366  Basic Metabolic Panel Recent Labs    10/23/20 0009 10/23/20 0150  NA 134* 133*  K 5.3* 5.4*  CL 104 104  CO2 20* 19*  GLUCOSE 129* 127*  BUN 34* 37*  CREATININE 1.66* 1.83*  CALCIUM 8.3* 8.2*  MG  --  1.9   Liver Function Tests Recent Labs    10/22/20 0456  AST 33  ALT 28  ALKPHOS 58  BILITOT 1.2  PROT 6.1*  ALBUMIN 2.9*   No results for input(s): LIPASE, AMYLASE in the last 72 hours. High Sensitivity Troponin:   No results for input(s): TROPONINIHS in the last 720 hours.  BNP Invalid input(s): POCBNP D-Dimer No results for input(s): DDIMER  in the last 72 hours. Hemoglobin A1C Recent Labs    10/22/20 0456  HGBA1C 5.1   Fasting Lipid Panel Recent Labs    10/21/20 0311  CHOL 120  HDL 62  LDLCALC 47  TRIG 53  CHOLHDL 1.9   Thyroid Function Tests No results for input(s): TSH, T4TOTAL, T3FREE, THYROIDAB in the last 72 hours.  Invalid input(s): FREET3 _____________  DG Chest 2 View  Result Date: 10/16/2020 CLINICAL DATA:  Syncope. EXAM: CHEST - 2 VIEW COMPARISON:  August 07, 2019. FINDINGS: Stable cardiomegaly. No pneumothorax is noted. Mild bibasilar atelectasis is noted with small pleural effusions. Bony thorax is unremarkable. IMPRESSION: Mild bibasilar atelectasis with small pleural effusions. Electronically Signed   By: Marijo Conception M.D.   On: 10/16/2020 14:28   CT Head Wo Contrast  Result Date: 10/16/2020 CLINICAL DATA:  Altered mental status after fall from golf cart. EXAM: CT HEAD WITHOUT CONTRAST TECHNIQUE: Contiguous axial images were obtained from the base of the skull through the vertex without intravenous contrast. COMPARISON:  March 15, 2019. FINDINGS: Brain: Mild diffuse cortical atrophy is noted. Mild chronic ischemic white matter disease is noted. 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 No mass effect or midline shift is noted. Ventricular size is within normal limits. There is no evidence of  mass lesion, hemorrhage or acute infarction. Vascular: No hyperdense vessel or unexpected calcification. Skull: Normal. Negative for fracture or focal lesion. Sinuses/Orbits: No acute finding. Other: None. IMPRESSION: Mild diffuse cortical atrophy. Mild chronic ischemic white matter disease. 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 No acute intracranial abnormality seen. Electronically Signed   By: Marijo Conception M.D.   On: 10/16/2020 10:26   CARDIAC CATHETERIZATION  Result Date: 10/21/2020 Conclusions: 1. Mild to moderate coronary artery disease, including 40-50% ostial LMCA, sequential 20% proximal and 10-20% distal LAD lesions, and 30% ostial RCA stenosis. 2. Upper normal left heart, right heart, and pulmonary artery pressures. 3. Normal Fick cardiac output/index. Recommendations: 1. Ongoing workup of severe aortic stenosis per valve team. 2. Medical therapy and risk factor modification to prevent progression of coronary artery disease. 3. Restart heparin infusion 2 hours after TR band deflation. [name], MD [facility]   CT CORONARY [facility] W/CTA COR W/SCORE Lewanda Rife W/CM &/OR WO/CM  Addendum Date: 10/19/2020   ADDENDUM REPORT: 10/19/2020 07:35 EXAM: OVER-READ INTERPRETATION  CT CHEST The following report is an over-read performed by radiologist Dr. Samara Snide [facility], [place] on 10/19/2020. This over-read does not include interpretation of cardiac or coronary anatomy or pathology. The coronary CTA interpretation by the cardiologist is attached. COMPARISON:  11/23/2018 chest CT. FINDINGS: Please see the separate concurrent chest CT angiogram report for details. IMPRESSION: Please see the separate concurrent chest CT angiogram report for details. Electronically Signed   By: Ilona Sorrel M.D.   On: 10/19/2020 07:35   Result Date: 10/19/2020 CLINICAL DATA:  Severe Aortic Stenosis. EXAM: Cardiac TAVR CT TECHNIQUE: The patient was scanned on a [employer]. A 100 kV retrospective scan was  triggered in the descending thoracic aorta at 111 HU's. Gantry rotation speed was 250 msecs and collimation was .6 mm. No beta blockade or nitro were given. The 3D data set was reconstructed in 5% intervals of the R-R cycle. Systolic and diastolic phases were analyzed on a dedicated work station using MPR, MIP and VRT modes. The patient received 80 cc of contrast. FINDINGS: Image quality: Excellent. Noise artifact is: Cardiac motion artifact noted. Millisecond reconstruction performed for TAVR analysis.  Valve Morphology: The aortic valve is tricuspid with partial fusion of the NCC/RCC. The leaflets are diffusely calcified with bulky calcification noted on the [place]. All leaflets are severely restricted in systole. There is incomplete coaptation of the leaflets suggestive of at least moderate aortic regurgitation. Aortic Valve Calcium score: 3480 Aortic annular dimension: Phase assessed: 240 ms Annular area: 609 mm2 Annular perimeter: 89.2 mm Max diameter: 29.8 mm Min diameter: 26.4 mm Annular and subannular calcification: There is a single nodular calcification under the [place] that is mild. Optimal coplanar projection: LAO 10 CRA 6 Coronary Artery Height above Annulus: Left Main: 21.9 mm Right Coronary: 16.1 mm Sinus of Valsalva Measurements: Non-coronary: 42.5 mm Right-coronary: 37.3 mm Left-coronary: 42.4 mm Sinus of Valsalva Height: Non-coronary: 26.6 mm Right-coronary: 20.4 mm Left-coronary: 28.0 mm Sinotubular Junction: 34 mm Ascending Thoracic Aorta: 43 mm Coronary Arteries: Normal coronary origin. Right dominance. The study was performed without use of NTG and is insufficient for plaque evaluation. Severe 3-vessel calcifications noted. Cardiac Morphology: Right Atrium: Right atrial size is dilated. Right Ventricle: The right ventricular cavity is dilated. Left Atrium: Left atrial size is severely dilated. There is contrast mixing artifact in the left atrial appendage. Left Ventricle: The ventricular cavity size  is within normal limits. There are no stigmata of prior infarction. There is no abnormal filling defect. Pulmonary arteries: Normal in size without proximal filling defect. Pulmonary veins: Normal pulmonary venous drainage. Pericardium: Normal thickness with no significant effusion or calcium present. Mitral Valve: There is severe mitral annular calcification. Extra-cardiac findings: See attached radiology report for non-cardiac structures. IMPRESSION: 1. Tricuspid aortic valve with severe stenosis (calcium score 3480). 2. TAVR analysis performed at 240 ms due to cardiac motion artifact. 3. Annular measurements appropriate 29 mm Edwards S3 TAVR (609 mm2). 4. There is a single nodular calcification under the [place] that is mild. 5. Sufficient coronary to annulus distance. 6. Optimal Fluoroscopic Angle for Delivery: LAO 10 CRA 6 7. Contrast mixing artifact in the left atrial appendage. Consider TEE to exclude LAA thrombus. 8. Mildly dilated aortic root (up to 42 mm). 9. Moderately dilated ascending aorta up to 43 mm measured using double-oblique technique. 10. Severe mitral annular calcification. [place] T. Audie Box, MD Electronically Signed: By: Eleonore Chiquito On: 10/18/2020 19:18   DG Chest Port 1 View  Result Date: 10/22/2020 CLINICAL DATA:  Status post TAVR. EXAM: PORTABLE CHEST 1 VIEW COMPARISON:  October 16, 2020 FINDINGS: There is no evidence of acute infiltrate, pleural effusion or pneumothorax. There is mild to moderate severity enlargement of the cardiac silhouette. An artificial aortic valve is seen. Marked severity calcification of the aortic arch is noted. Degenerative changes seen throughout the thoracic spine. IMPRESSION: Status post artificial aortic valve placement without acute or active cardiopulmonary disease. Electronically Signed   By: Virgina Norfolk M.D.   On: 10/22/2020 15:24   CT ANGIO CHEST AORTA W/CM & OR WO/CM  Result Date: 10/19/2020 CLINICAL DATA:  Aortic stenosis.  Pre-TAVR  planning. EXAM: CT ANGIOGRAPHY CHEST, ABDOMEN AND PELVIS TECHNIQUE: Non-contrast CT of the chest was initially obtained. Multidetector CT imaging through the chest, abdomen and pelvis was performed using the standard protocol during bolus administration of intravenous contrast. Multiplanar reconstructed images and MIPs were obtained and reviewed to evaluate the vascular anatomy. CONTRAST:  26m OMNIPAQUE IOHEXOL 350 MG/ML SOLN COMPARISON:  08/05/2027 CT angiogram of the abdomen and pelvis. 11/23/2018 chest CT. FINDINGS: CTA CHEST FINDINGS Cardiovascular: Mild-to-moderate cardiomegaly. No significant pericardial effusion/thickening. Diffuse thickening and coarse calcification of  the aortic valve. Left main and 3 vessel coronary atherosclerosis. Atherosclerotic thoracic aorta with 4.5 cm ascending thoracic aortic aneurysm. Top-normal caliber main pulmonary artery (3.2 cm diameter). No central pulmonary emboli. Mediastinum/Nodes: No discrete thyroid nodules. Unremarkable esophagus. No pathologically enlarged axillary, mediastinal or hilar lymph nodes. Lungs/Pleura: No pneumothorax. Small dependent bilateral pleural effusions, right greater than left. Mild centrilobular and paraseptal emphysema with diffuse bronchial wall thickening. Several solid pulmonary nodules scattered in the right greater than left lungs, largest 4 mm in the right lower lobe (series 5/image 86), all stable since 11/23/2018 chest CT and considered benign. Mild passive atelectasis in the dependent lower lobes. No acute consolidative airspace disease, lung masses or new significant pulmonary nodules. Musculoskeletal: No aggressive appearing focal osseous lesions. Moderate thoracic spondylosis. CTA ABDOMEN AND PELVIS FINDINGS Hepatobiliary: Normal liver with no liver mass. Contracted and otherwise normal gallbladder with no radiopaque cholelithiasis. No biliary ductal dilatation. Pancreas: Cystic 2.6 cm pancreatic body lesion (series 4/image 129),  increased from 1.0 cm on 08/05/2019, with associated new pancreatic tail atrophy and duct dilation up to 5 mm diameter. Spleen: Normal size. No mass. Adrenals/Urinary Tract: Normal adrenals. No hydronephrosis. Simple 1.3 cm anterior upper right renal cyst. Several simple left renal cysts, largest 11.1 cm in the lower left kidney. Chronic mild diffuse bladder wall thickening and trabeculation with scattered small bladder diverticula, unchanged. Stomach/Bowel: Normal non-distended stomach. Normal caliber small bowel with no small bowel wall thickening. Normal appendix. Mild sigmoid diverticulosis with no large bowel wall thickening or significant pericolonic fat stranding. Vascular/Lymphatic: Atherosclerotic abdominal aorta with ectatic 2.5 cm infrarenal abdominal aorta. No pathologically enlarged lymph nodes in the abdomen or pelvis. Reproductive: Moderate prostatomegaly. Other: No pneumoperitoneum, ascites or focal fluid collection. Musculoskeletal: No aggressive appearing focal osseous lesions. Lateral right eleventh rib fracture of uncertain chronicity, possibly acute. Moderate to marked lumbar degenerative disc disease, most prominent at L5-S1. VASCULAR MEASUREMENTS PERTINENT TO TAVR: AORTA: Minimal Aortic Diameter-16.2 x 16.0 mm Severity of Aortic Calcification-severe RIGHT PELVIS: Right Common Iliac Artery - Minimal Diameter-9.3 x 8.7 mm Tortuosity-moderate Calcification-moderate Right External Iliac Artery - Minimal Diameter-7.8 x 7.3 mm Tortuosity-moderate to severe Calcification-none Right Common Femoral Artery - Minimal Diameter-8.2 x 7.2 mm Tortuosity-mild Calcification-mild LEFT PELVIS: Left Common Iliac Artery - Minimal Diameter-9.3 x 7.1 mm Tortuosity-mild Calcification-severe Left External Iliac Artery - Minimal Diameter-8.7 x 7.5 mm Tortuosity-mild Calcification-mild Left Common Femoral Artery - Minimal Diameter-8.0 x 7.8 mm Tortuosity-mild Calcification-moderate Review of the MIP images confirms the  above findings. IMPRESSION: 1. Vascular findings and measurements pertinent to potential TAVR procedure, as detailed. 2. Diffusely thickened and calcified aortic valve, compatible with reported history of aortic stenosis. 3. Mild-to-moderate cardiomegaly. Left main and 3 vessel coronary atherosclerosis. 4. Small dependent bilateral pleural effusions, right greater than left. 5. Mild centrilobular and paraseptal emphysema with diffuse bronchial wall thickening, suggesting COPD. 6. Cystic 2.6 cm pancreatic body lesion with associated new pancreatic tail atrophy and duct dilation, increased since 08/05/2019 CT. 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 MRI abdomen without and with IV contrast is indicated for further characterization. 7. Mild sigmoid diverticulosis. 8. Moderate prostatomegaly. Chronic mild diffuse bladder wall thickening and trabeculation with scattered small bladder diverticula, suggesting chronic bladder outlet obstruction. 9. Lateral right eleventh rib fracture of uncertain chronicity, possibly acute. 10. Aortic Atherosclerosis (ICD10-I70.0) and Emphysema (ICD10-J43.9). Electronically Signed   By: Ilona Sorrel M.D.   On: 10/19/2020 07:31   ECHOCARDIOGRAM COMPLETE  Result Date: 10/23/2020    ECHOCARDIOGRAM REPORT   Patient  Name:   Luciana Axe Humphres Date of Exam: 10/23/2020 Medical Rec #:  [id number]       Height:       73.0 in Accession #:    [id number]      Weight:       183.2 lb Date of Birth:  09-24-1929       BSA:          2.073 m Patient Age:    23 years        BP:           114/50 mmHg Patient Gender: M               HR:           66 bpm. Exam Location:  Inpatient Procedure: 2D Echo, Cardiac Doppler and Color Doppler Indications:     Post TAVR Evaluation V43.3/Z95.2  History:         Patient has prior history of Echocardiogram examinations, most                  recent 10/22/2020. CAD, Aortic Valve Disease, Arrythmias:Atrial                  Fibrillation, Signs/Symptoms:Syncope;  Risk Factors:Hypertension                  and Dyslipidemia.                  Aortic Valve: 29 mm Sapien prosthetic, stented (TAVR) valve is                  present in the aortic position. Procedure Date: 10/22/2020.  Sonographer:     Tiffany Dance Referring Phys:  [id number] Eileen Stanford Diagnosing Phys: Jenkins Rouge MD IMPRESSIONS  1. Left ventricular ejection fraction, by estimation, is 55%. The left ventricle has normal function. The left ventricle has no regional wall motion abnormalities. There is mild left ventricular hypertrophy. Left ventricular diastolic parameters are indeterminate.  2. Right ventricular systolic function is normal. The right ventricular size is normal.  3. Left atrial size was severely dilated.  4. Right atrial size was moderately dilated.  5. The mitral valve is degenerative. Mild mitral valve regurgitation. No evidence of mitral stenosis. Severe mitral annular calcification.  6. 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 The aortic valve has been repaired/replaced. Aortic valve regurgitation is trivial. No aortic stenosis is present. There is a 29 mm Sapien prosthetic (TAVR) valve present in the aortic position. Procedure Date: 10/22/2020.  7. Aortic dilatation noted. There is moderate dilatation of the aortic root, measuring 43 mm.  8. The inferior vena cava is normal in size with greater than 50% respiratory variability, suggesting right atrial pressure of 3 mmHg. FINDINGS  Left Ventricle: Left ventricular ejection fraction, by estimation, is 55%. The left ventricle has normal function. The left ventricle has no regional wall motion abnormalities. The left ventricular internal cavity size was normal in size. There is mild left ventricular hypertrophy. Left ventricular diastolic parameters are indeterminate. Right Ventricle: The right ventricular size is normal. No increase in right  ventricular wall thickness. Right ventricular systolic function is normal. Left Atrium: Left atrial size was severely dilated. Right Atrium: Right atrial size was moderately dilated. Pericardium: There is no evidence of pericardial effusion. Mitral Valve: The mitral valve is degenerative in  appearance. There is severe thickening of the mitral valve leaflet(s). There is severe calcification of the mitral valve leaflet(s). Severe mitral annular calcification. Mild mitral valve regurgitation. No evidence of mitral valve stenosis. Tricuspid Valve: The tricuspid valve is normal in structure. Tricuspid valve regurgitation is mild . No evidence of tricuspid stenosis. Aortic Valve: Post TAVR 10/22/20 with 29 mm Sapien 3 valve mean gradient 12 peak 22 mmHg with aVA 2.4 cm2 trivial PVL seen only on PSSA images at 3:00. The aortic valve has been repaired/replaced. Aortic valve regurgitation is trivial. Aortic regurgitation PHT measures 379 msec. No aortic stenosis is present. Aortic valve mean gradient measures 11.5 mmHg. Aortic valve peak gradient measures 21.9 mmHg. Aortic valve area, by VTI measures 2.77 cm. There is a 29 mm Sapien prosthetic, stented (TAVR) valve present in the aortic position. Procedure Date: 10/22/2020. Pulmonic Valve: The pulmonic valve was normal in structure. Pulmonic valve regurgitation is mild. No evidence of pulmonic stenosis. Aorta: The aortic root is normal in size and structure and aortic dilatation noted. There is moderate dilatation of the aortic root, measuring 43 mm. Venous: The inferior vena cava is normal in size with greater than 50% respiratory variability, suggesting right atrial pressure of 3 mmHg. IAS/Shunts: No atrial level shunt detected by color flow Doppler.  LEFT VENTRICLE PLAX 2D LVIDd:         5.20 cm LVIDs:         3.20 cm LV PW:         1.70 cm LV IVS:        1.20 cm LVOT diam:     2.10 cm LV SV:         108 LV SV Index:   52 LVOT Area:     3.46 cm  RIGHT VENTRICLE           IVC RV Basal diam:  4.30 cm  IVC diam: 1.90 cm RV Mid diam:    2.70 cm TAPSE (M-mode): 1.7 cm LEFT ATRIUM              Index        RIGHT ATRIUM           Index LA diam:        7.00 cm  3.38 cm/m   RA Area:     39.00 cm LA Vol (A2C):   208.0 ml 100.34 ml/m RA Volume:   162.00 ml 78.15 ml/m LA Vol (A4C):   189.0 ml 91.18 ml/m LA Biplane Vol: 201.0 ml 96.97 ml/m  AORTIC VALVE AV Area (Vmax):    2.72 cm AV Area (Vmean):   2.69 cm AV Area (VTI):     2.77 cm AV Vmax:           234.00 cm/s AV Vmean:          153.500 cm/s AV VTI:            0.390 m AV Peak Grad:      21.9 mmHg AV Mean Grad:      11.5 mmHg LVOT Vmax:         183.50 cm/s LVOT Vmean:        119.000 cm/s LVOT VTI:          0.312 m LVOT/AV VTI ratio: 0.80 AI PHT:            379 msec  AORTA Ao Root diam: 4.30 cm Ao Asc diam:  3.70 cm MITRAL VALVE MV Area (PHT): 3.48 cm  SHUNTS MV Decel Time: 218 msec     Systemic VTI:  0.31 m MV E velocity: 114.00 cm/s  Systemic Diam: 2.10 cm MV A velocity: 37.20 cm/s MV E/A ratio:  3.06 Jenkins Rouge MD Electronically signed by Jenkins Rouge MD Signature Date/Time: 10/23/2020/1:19:13 PM    Final (Updated)    ECHOCARDIOGRAM COMPLETE  Result Date: 10/17/2020    ECHOCARDIOGRAM REPORT   Patient Name:   Cesar [name] Date of Exam: 10/17/2020 Medical Rec #:  [id number]       Height:       73.0 in Accession #:    [id number]      Weight:       190.0 lb Date of Birth:  15-Jun-1929       BSA:          2.105 m Patient Age:    40 years        BP:           131/76 mmHg Patient Gender: M               HR:           68 bpm. Exam Location:  Inpatient Procedure: 2D Echo, Cardiac Doppler and Color Doppler Indications:    Aortic stenosis  History:        Patient has prior history of Echocardiogram examinations, most                 recent 07/31/2019. Arrythmias:Atrial Fibrillation; Risk                 Factors:Dyslipidemia.  Sonographer:    Clayton Lefort RDCS (AE) Referring Phys: [id number] [place]  1. Left ventricular  ejection fraction, by estimation, is 50 to 55%. The left ventricle has low normal function. The left ventricle has no regional wall motion abnormalities. There is moderate left ventricular hypertrophy. Left ventricular diastolic parameters are indeterminate.  2. Right ventricular systolic function is mildly reduced. The right ventricular size is normal. There is normal pulmonary artery systolic pressure. The estimated right ventricular systolic pressure is 38.9 mmHg.  3. Left atrial size was severely dilated.  4. Right atrial size was severely dilated.  5. The mitral valve is abnormal. Mild mitral valve regurgitation. Severe mitral annular calcification.  6. The inferior vena cava is dilated in size with >50% respiratory variability, suggesting right atrial pressure of 8 mmHg.  7. The aortic valve is calcified. There is severe calcifcation of the aortic valve. Aortic valve regurgitation is mild to moderate. Severe aortic valve stenosis. Vmax 4.1 m/s, MG 37 mmHg, AVA 0.4 cm^2, DI 0.14 FINDINGS  Left Ventricle: Left ventricular ejection fraction, by estimation, is 50 to 55%. The left ventricle has low normal function. The left ventricle has no regional wall motion abnormalities. The left ventricular internal cavity size was normal in size. There is moderate left ventricular hypertrophy. Left ventricular diastolic parameters are indeterminate. Right Ventricle: The right ventricular size is normal. Right vetricular wall thickness was not well visualized. Right ventricular systolic function is mildly reduced. There is normal pulmonary artery systolic pressure. The tricuspid regurgitant velocity is 2.27 m/s, and with an assumed right atrial pressure of 8 mmHg, the estimated right ventricular systolic pressure is 37.3 mmHg. Left Atrium: Left atrial size was severely dilated. Right Atrium: Right atrial size was severely dilated. Pericardium: There is no evidence of pericardial effusion. Mitral Valve: The mitral valve is  abnormal. Severe mitral annular calcification. Mild mitral valve regurgitation. Tricuspid Valve:  The tricuspid valve is normal in structure. Tricuspid valve regurgitation is trivial. Aortic Valve: The aortic valve is calcified. There is severe calcifcation of the aortic valve. Aortic valve regurgitation is mild to moderate. Aortic regurgitation PHT measures 359 msec. Severe aortic stenosis is present. Aortic valve mean gradient measures 36.7 mmHg. Aortic valve peak gradient measures 61.7 mmHg. Aortic valve area, by VTI measures 0.43 cm. Pulmonic Valve: The pulmonic valve was not well visualized. Pulmonic valve regurgitation is trivial. Aorta: The aortic root and ascending aorta are structurally normal, with no evidence of dilitation. Venous: The inferior vena cava is dilated in size with greater than 50% respiratory variability, suggesting right atrial pressure of 8 mmHg. IAS/Shunts: The interatrial septum was not well visualized.  LEFT VENTRICLE PLAX 2D LVIDd:         5.00 cm LVIDs:         3.60 cm LV PW:         1.60 cm LV IVS:        1.30 cm LVOT diam:     2.00 cm LV SV:         41 LV SV Index:   19 LVOT Area:     3.14 cm  RIGHT VENTRICLE            IVC RV Basal diam:  4.10 cm    IVC diam: 2.30 cm RV Mid diam:    2.60 cm RV S prime:     6.20 cm/s TAPSE (M-mode): 1.5 cm LEFT ATRIUM              Index        RIGHT ATRIUM           Index LA diam:        5.70 cm  2.71 cm/m   RA Area:     34.40 cm LA Vol (A2C):   228.0 ml 108.29 ml/m RA Volume:   113.00 ml 53.67 ml/m LA Vol (A4C):   240.0 ml 113.99 ml/m LA Biplane Vol: 235.0 ml 111.62 ml/m  AORTIC VALVE AV Area (Vmax):    0.44 cm AV Area (Vmean):   0.44 cm AV Area (VTI):     0.43 cm AV Vmax:           392.67 cm/s AV Vmean:          284.667 cm/s AV VTI:            0.951 m AV Peak Grad:      61.7 mmHg AV Mean Grad:      36.7 mmHg LVOT Vmax:         55.00 cm/s LVOT Vmean:        39.767 cm/s LVOT VTI:          0.130 m LVOT/AV VTI ratio: 0.14 AI PHT:             359 msec  AORTA Ao Root diam: 3.80 cm Ao Asc diam:  3.30 cm MR Peak grad: 121.0 mmHg  TRICUSPID VALVE MR Mean grad: 72.0 mmHg   TR Peak grad:   20.6 mmHg MR Vmax:      550.00 cm/s TR Vmax:        227.00 cm/s MR Vmean:     393.0 cm/s                           SHUNTS  Systemic VTI:  0.13 m                           Systemic Diam: 2.00 cm Oswaldo Milian MD Electronically signed by Oswaldo Milian MD Signature Date/Time: 10/17/2020/4:58:28 PM    Final    VAS US CAROTID  Result Date: 10/21/2020 Carotid Arterial Duplex Study Indications:       Pre TAVR. Comparison Study:  no prior Performing Technologist: Abram Sander RVS  Examination Guidelines: A complete evaluation includes B-mode imaging, spectral Doppler, color Doppler, and power Doppler as needed of all accessible portions of each vessel. Bilateral testing is considered an integral part of a complete examination. Limited examinations for reoccurring indications may be performed as noted.  Right Carotid Findings: +----------+--------+--------+--------+------------------+--------+           PSV cm/sEDV cm/sStenosisPlaque DescriptionComments +----------+--------+--------+--------+------------------+--------+ CCA Prox  51      9               heterogenous               +----------+--------+--------+--------+------------------+--------+ CCA Distal45      12              heterogenous               +----------+--------+--------+--------+------------------+--------+ ICA Prox  81      16      1-39%   heterogenous               +----------+--------+--------+--------+------------------+--------+ ICA Distal56      13                                         +----------+--------+--------+--------+------------------+--------+ ECA       76      8                                          +----------+--------+--------+--------+------------------+--------+  +----------+--------+-------+--------+-------------------+           PSV cm/sEDV cmsDescribeArm Pressure (mmHG) +----------+--------+-------+--------+-------------------+ [id number]                                         +----------+--------+-------+--------+-------------------+ +---------+--------+--------+--------------+ VertebralPSV cm/sEDV cm/sNot identified +---------+--------+--------+--------------+  Left Carotid Findings: +----------+--------+--------+--------+------------------+--------+           PSV cm/sEDV cm/sStenosisPlaque DescriptionComments +----------+--------+--------+--------+------------------+--------+ CCA Prox  129     11              heterogenous               +----------+--------+--------+--------+------------------+--------+ CCA Distal49      9               heterogenous               +----------+--------+--------+--------+------------------+--------+ ICA Prox  60      10      1-39%   heterogenous               +----------+--------+--------+--------+------------------+--------+ ICA Distal78      17                                         +----------+--------+--------+--------+------------------+--------+  ECA       106     10                                         +----------+--------+--------+--------+------------------+--------+ +----------+--------+--------+--------+-------------------+           PSV cm/sEDV cm/sDescribeArm Pressure (mmHG) +----------+--------+--------+--------+-------------------+ [id number]                                          +----------+--------+--------+--------+-------------------+ +---------+--------+--+--------+--+---------+ VertebralPSV cm/s58EDV cm/s12Antegrade +---------+--------+--+--------+--+---------+   Summary: Right Carotid: Velocities in the right ICA are consistent with a 1-39% stenosis. Left Carotid: Velocities in the left ICA are consistent with a 1-39% stenosis.  Vertebrals: Left vertebral artery demonstrates antegrade flow. Right vertebral             artery was not visualized. *See table(s) above for measurements and observations.  Electronically signed by Servando Snare MD on 10/21/2020 at 7:23:27 AM.    Final    ECHOCARDIOGRAM LIMITED  Result Date: 10/22/2020    ECHOCARDIOGRAM LIMITED REPORT   Patient Name:   HENRRY FEIL Date of Exam: 10/22/2020 Medical Rec #:  [id number]       Height:       73.0 in Accession #:    [id number]      Weight:       169.5 lb Date of Birth:  09-Jun-1929       BSA:          2.006 m Patient Age:    30 years        BP:           49/107 mmHg Patient Gender: M               HR:           69 bpm. Exam Location:  Inpatient Procedure: Limited Echo, Cardiac Doppler and Color Doppler Indications:     Aortic stenosis  History:         Patient has prior history of Echocardiogram examinations, most                  recent 10/17/2020. CAD, Aortic Valve Disease, Arrythmias:Atrial                  Fibrillation, Signs/Symptoms:Syncope; Risk Factors:Hypertension                  and Dyslipidemia.  Sonographer:     Dustin Flock Referring Phys:  [place] Phys: Jenkins Rouge MD IMPRESSIONS  1. Left ventricular ejection fraction, by estimation, is 50 to 55%. The left ventricle has low normal function. There is moderate left ventricular hypertrophy. Left ventricular diastolic function could not be evaluated.  2. The mitral valve is degenerative. Mild mitral valve regurgitation. Severe mitral annular calcification.  3. Pre TAVR: tri leaflet severely calcified with restricted leaflet motion. Peak gradient 58 mm hg mean 32 mmHg supine in OR AVA 1 cm2 mild AR         Post TAVR: well placed 29 mm Sapien 3 valve. No significant PvL mean gradient 3 peak 5 mmHg AVA 2.84 . The aortic valve has been repaired/replaced. Aortic valve regurgitation is mild. Severe aortic valve stenosis. Procedure Date: 10/17/2020. FINDINGS  Left Ventricle: Left  ventricular ejection fraction, by  estimation, is 50 to 55%. The left ventricle has low normal function. There is moderate left ventricular hypertrophy. Left ventricular diastolic function could not be evaluated. Mitral Valve: The mitral valve is degenerative in appearance. There is moderate thickening of the mitral valve leaflet(s). There is moderate calcification of the mitral valve leaflet(s). Severe mitral annular calcification. Mild mitral valve regurgitation. Aortic Valve: Pre TAVR: tri leaflet severely calcified with restricted leaflet motion. Peak gradient 58 mm hg mean 32 mmHg supine in OR AVA 1 cm2 mild AR Post TAVR: well placed 29 mm Sapien 3 valve. No significant PvL mean gradient 3 peak 5 mmHg AVA 2.84. The aortic valve has been repaired/replaced. Aortic valve regurgitation is mild. Severe aortic stenosis is present. Aortic valve mean gradient measures 12.7 mmHg. Aortic valve peak gradient measures 16.0 mmHg. Aortic valve area, by VTI measures 2.86 cm. There is a Sapien prosthetic, stented (TAVR) valve present in the aortic position. LEFT VENTRICLE PLAX 2D LVOT diam:     2.90 cm LV SV:         96 LV SV Index:   48 LVOT Area:     6.61 cm  AORTIC VALVE AV Area (Vmax):    2.61 cm AV Area (Vmean):   2.48 cm AV Area (VTI):     2.86 cm AV Vmax:           200.00 cm/s AV Vmean:          136.333 cm/s AV VTI:            0.334 m AV Peak Grad:      16.0 mmHg AV Mean Grad:      12.7 mmHg LVOT Vmax:         79.05 cm/s LVOT Vmean:        51.200 cm/s LVOT VTI:          0.145 m LVOT/AV VTI ratio: 0.43  SHUNTS Systemic VTI:  0.14 m Systemic Diam: 2.90 cm Jenkins Rouge MD Electronically signed by Jenkins Rouge MD Signature Date/Time: 10/22/2020/11:33:58 AM    Final    Structural Heart Procedure  Result Date: 10/22/2020 See surgical note for result.  CT ANGIO ABDOMEN PELVIS  W &/OR WO CONTRAST  Result Date: 10/19/2020 CLINICAL DATA:  Aortic stenosis.  Pre-TAVR planning. EXAM: CT ANGIOGRAPHY CHEST, ABDOMEN AND  PELVIS TECHNIQUE: Non-contrast CT of the chest was initially obtained. Multidetector CT imaging through the chest, abdomen and pelvis was performed using the standard protocol during bolus administration of intravenous contrast. Multiplanar reconstructed images and MIPs were obtained and reviewed to evaluate the vascular anatomy. CONTRAST:  52m OMNIPAQUE IOHEXOL 350 MG/ML SOLN COMPARISON:  08/05/2027 CT angiogram of the abdomen and pelvis. 11/23/2018 chest CT. FINDINGS: CTA CHEST FINDINGS Cardiovascular: Mild-to-moderate cardiomegaly. No significant pericardial effusion/thickening. Diffuse thickening and coarse calcification of the aortic valve. Left main and 3 vessel coronary atherosclerosis. Atherosclerotic thoracic aorta with 4.5 cm ascending thoracic aortic aneurysm. Top-normal caliber main pulmonary artery (3.2 cm diameter). No central pulmonary emboli. Mediastinum/Nodes: No discrete thyroid nodules. Unremarkable esophagus. No pathologically enlarged axillary, mediastinal or hilar lymph nodes. Lungs/Pleura: No pneumothorax. Small dependent bilateral pleural effusions, right greater than left. Mild centrilobular and paraseptal emphysema with diffuse bronchial wall thickening. Several solid pulmonary nodules scattered in the right greater than left lungs, largest 4 mm in the right lower lobe (series 5/image 86), all stable since 11/23/2018 chest CT and considered benign. Mild passive atelectasis in the dependent lower lobes. No acute consolidative airspace disease, lung masses or new  significant pulmonary nodules. Musculoskeletal: No aggressive appearing focal osseous lesions. Moderate thoracic spondylosis. CTA ABDOMEN AND PELVIS FINDINGS Hepatobiliary: Normal liver with no liver mass. Contracted and otherwise normal gallbladder with no radiopaque cholelithiasis. No biliary ductal dilatation. Pancreas: Cystic 2.6 cm pancreatic body lesion (series 4/image 129), increased from 1.0 cm on 08/05/2019, with associated  new pancreatic tail atrophy and duct dilation up to 5 mm diameter. Spleen: Normal size. No mass. Adrenals/Urinary Tract: Normal adrenals. No hydronephrosis. Simple 1.3 cm anterior upper right renal cyst. Several simple left renal cysts, largest 11.1 cm in the lower left kidney. Chronic mild diffuse bladder wall thickening and trabeculation with scattered small bladder diverticula, unchanged. Stomach/Bowel: Normal non-distended stomach. Normal caliber small bowel with no small bowel wall thickening. Normal appendix. Mild sigmoid diverticulosis with no large bowel wall thickening or significant pericolonic fat stranding. Vascular/Lymphatic: Atherosclerotic abdominal aorta with ectatic 2.5 cm infrarenal abdominal aorta. No pathologically enlarged lymph nodes in the abdomen or pelvis. Reproductive: Moderate prostatomegaly. Other: No pneumoperitoneum, ascites or focal fluid collection. Musculoskeletal: No aggressive appearing focal osseous lesions. Lateral right eleventh rib fracture of uncertain chronicity, possibly acute. Moderate to marked lumbar degenerative disc disease, most prominent at L5-S1. VASCULAR MEASUREMENTS PERTINENT TO TAVR: AORTA: Minimal Aortic Diameter-16.2 x 16.0 mm Severity of Aortic Calcification-severe RIGHT PELVIS: Right Common Iliac Artery - Minimal Diameter-9.3 x 8.7 mm Tortuosity-moderate Calcification-moderate Right External Iliac Artery - Minimal Diameter-7.8 x 7.3 mm Tortuosity-moderate to severe Calcification-none Right Common Femoral Artery - Minimal Diameter-8.2 x 7.2 mm Tortuosity-mild Calcification-mild LEFT PELVIS: Left Common Iliac Artery - Minimal Diameter-9.3 x 7.1 mm Tortuosity-mild Calcification-severe Left External Iliac Artery - Minimal Diameter-8.7 x 7.5 mm Tortuosity-mild Calcification-mild Left Common Femoral Artery - Minimal Diameter-8.0 x 7.8 mm Tortuosity-mild Calcification-moderate Review of the MIP images confirms the above findings. IMPRESSION: 1. Vascular findings and  measurements pertinent to potential TAVR procedure, as detailed. 2. Diffusely thickened and calcified aortic valve, compatible with reported history of aortic stenosis. 3. Mild-to-moderate cardiomegaly. Left main and 3 vessel coronary atherosclerosis. 4. Small dependent bilateral pleural effusions, right greater than left. 5. Mild centrilobular and paraseptal emphysema with diffuse bronchial wall thickening, suggesting COPD. 6. Cystic 2.6 cm pancreatic body lesion with associated new pancreatic tail atrophy and duct dilation, increased since 08/05/2019 CT. 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 MRI abdomen without and with IV contrast is indicated for further characterization. 7. Mild sigmoid diverticulosis. 8. Moderate prostatomegaly. Chronic mild diffuse bladder wall thickening and trabeculation with scattered small bladder diverticula, suggesting chronic bladder outlet obstruction. 9. Lateral right eleventh rib fracture of uncertain chronicity, possibly acute. 10. Aortic Atherosclerosis (ICD10-I70.0) and Emphysema (ICD10-J43.9). Electronically Signed   By: Ilona Sorrel M.D.   On: 10/19/2020 07:31   Disposition   Pt is being discharged home today in good condition.  Follow-up Plans & Appointments     Follow-up Information     Eileen Stanford, PA-C. Go on 10/30/2020.   Specialties: Cardiology, Radiology Why: @ 2:30pm. Please arrive at least 10 minutes early.  Contact information: [address] [place] [address] [phone]                   Discharge Medications   Allergies as of 10/23/2020   No Known Allergies      Medication List     TAKE these medications    apixaban 5 MG Tabs tablet Commonly known as: ELIQUIS Take 5 mg by mouth 2 (two) times daily. Notes to patient: Please restart  eliquis this evening 10/23/20 and continue as prescribed   aspirin 81 MG chewable tablet Chew 1 tablet (81 mg total) by mouth daily. Start taking  on: October 24, 2020   atorvastatin 80 MG tablet Commonly known as: LIPITOR Take 80 mg by mouth daily.   divalproex 500 MG 24 hr tablet Commonly known as: DEPAKOTE ER Take 500 mg by mouth at bedtime.   finasteride 5 MG tablet Commonly known as: PROSCAR Take 5 mg by mouth daily.           Outstanding Labs/Studies   BMET at follow-up  Duration of Discharge Encounter   Greater than 30 minutes including physician time.  Signed, Abigail Butts, PA-C 10/23/2020, 2:06 PM   Personally seen and examined. Agree with APP above with the following comments: Briefly 87 yo M with syncope that is asymptomatic post TAVR Patient notes that he would not be amenable to subacute rehab if needed; willing to have home health come back thought his daughter notes he has a habit of kicking them out.  Family is aware of the risk associated without additional PT and is amenable to discharge Exam notable for resolution of systolic murmur; groin sites without hematoma Labs notable for slight increase in creatinine post TAVR Personally reviewed relevant tests; trivial para-valvular leak without significant prosthetic AS Would recommend BMP in follow up.  Discussed with patient, nursing, structural team, patient and family.  Appropriate for discharge.  Werner Lean, MD  Coding Query:  CKD IIIB.

## 2020-10-23 NOTE — Progress Notes (Signed)
Mobility Specialist: Progress Note   10/23/20 1336  Mobility  Activity Ambulated in hall  Level of Assistance Minimal assist, patient does 75% or more  Assistive Device Front wheel walker  Distance Ambulated (ft) 150 ft  Mobility Response Tolerated fair  Mobility performed by Mobility specialist  $Mobility charge 1 Mobility   Pre-Mobility: 56 HR, 131/58 BP, 100% SpO2 Post-Mobility: 80 HR, 160/63 BP, 96% SpO2  Pt had no c/o during ambulation. Noticed pt bending at his knees during walk. Encouraged pt to stand up tall. Asked if pt's knees were giving out or in pain and he said no. Pt back to bed, RN present.  [facility] Amy Gothard Mobility Specialist

## 2020-10-23 NOTE — Telephone Encounter (Signed)
Sent to refill pool to assist with sample request

## 2020-10-23 NOTE — Telephone Encounter (Signed)
Notified patients daughter 1 sample box is up front ready for pick up.

## 2020-10-23 NOTE — Progress Notes (Signed)
[place] VALVE TEAM  Patient Name: Cesar [name] Date of Encounter: 10/23/2020  Primary Cardiologist: Dr. [name] / Dr. Angelena Form & Dr. Roxy Manns (TAVR)  Hospital Problem List     Principal Problem:   S/P TAVR (transcatheter aortic valve replacement) Active Problems:   Syncope   Severe aortic stenosis     Subjective   No complaints this morning. No dyspnea or pain. No events overnight  Inpatient Medications    Scheduled Meds: . aspirin  81 mg Oral Daily  . atorvastatin  80 mg Oral QHS  . divalproex  500 mg Oral QHS  . finasteride  5 mg Oral Daily  . sodium chloride flush  3 mL Intravenous Q12H   Continuous Infusions: . sodium chloride    . cefUROXime (ZINACEF)  IV 1.5 g (10/22/20 2116)  . nitroGLYCERIN    . phenylephrine (NEO-SYNEPHRINE) Adult infusion Stopped (10/22/20 1302)   PRN Meds: sodium chloride, acetaminophen **OR** acetaminophen, morphine injection, ondansetron (ZOFRAN) IV, oxyCODONE, sodium chloride flush, traMADol   Vital Signs    Vitals:   10/23/20 0200 10/23/20 0247 10/23/20 0300 10/23/20 0400  BP: (!) 101/49 (!) 102/44 (!) 120/45 (!) 106/45  Pulse: 64 64 71 69  Resp: (!) 22 18 (!) 27 (!) 22  Temp:  98.7 F (37.1 C)    TempSrc:  Oral    SpO2: 92% 91% 94% 93%  Weight:  83.1 kg    Height:        Intake/Output Summary (Last 24 hours) at 10/23/2020 0627 Last data filed at 10/23/2020 0248 Gross per 24 hour  Intake 1590 ml  Output 1010 ml  Net 580 ml   Filed Weights   10/20/20 0211 10/21/20 0526 10/23/20 0247  Weight: 83.3 kg 76.9 kg 83.1 kg    Physical Exam    GEN: Well nourished, well developed, in no acute distress.  HEENT: Grossly normal.  Neck: Supple, no JVD, carotid bruits, or masses. Cardiac: RRR, no murmurs, rubs, or gallops. No clubbing, cyanosis, edema.   Respiratory:  Respirations regular and unlabored, clear to auscultation bilaterally. GI: Soft, nontender, nondistended, BS + x  4. MS: no deformity or atrophy. Skin: warm and dry, no rash.  Groin sites clear without hematoma or ecchymosis  Neuro:  Strength and sensation are intact. Psych: AAOx3.  Normal affect.  Labs    CBC Recent Labs    10/23/20 0009 10/23/20 0150  WBC 8.3 9.5  HGB 11.6* 11.4*  HCT 35.8* 33.7*  MCV 97.3 96.3  PLT 103* 245*   Basic Metabolic Panel Recent Labs    10/23/20 0009 10/23/20 0150  NA 134* 133*  K 5.3* 5.4*  CL 104 104  CO2 20* 19*  GLUCOSE 129* 127*  BUN 34* 37*  CREATININE 1.66* 1.83*  CALCIUM 8.3* 8.2*  MG  --  1.9   Liver Function Tests Recent Labs    10/22/20 0456  AST 33  ALT 28  ALKPHOS 58  BILITOT 1.2  PROT 6.1*  ALBUMIN 2.9*   No results for input(s): LIPASE, AMYLASE in the last 72 hours. Cardiac Enzymes No results for input(s): CKTOTAL, CKMB, CKMBINDEX, TROPONINI in the last 72 hours. BNP Invalid input(s): POCBNP D-Dimer No results for input(s): DDIMER in the last 72 hours. Hemoglobin A1C Recent Labs    10/22/20 0456  HGBA1C 5.1   Fasting Lipid Panel Recent Labs    10/21/20 0311  CHOL 120  HDL 62  LDLCALC 47  TRIG 53  CHOLHDL 1.9   Thyroid Function Tests No results for input(s): TSH, T4TOTAL, T3FREE, THYROIDAB in the last 72 hours.  Invalid input(s): FREET3  Telemetry    Atrial fib, 3 second pause this am - Personally Reviewed  ECG    afib with ILBBB - Personally Reviewed  Radiology    CARDIAC CATHETERIZATION  Result Date: 10/21/2020 Conclusions: 1. Mild to moderate coronary artery disease, including 40-50% ostial LMCA, sequential 20% proximal and 10-20% distal LAD lesions, and 30% ostial RCA stenosis. 2. Upper normal left heart, right heart, and pulmonary artery pressures. 3. Normal Fick cardiac output/index. Recommendations: 1. Ongoing workup of severe aortic stenosis per valve team. 2. Medical therapy and risk factor modification to prevent progression of coronary artery disease. 3. Restart heparin infusion 2 hours  after TR band deflation. [name], MD [facility]   DG Chest Port 1 View  Result Date: 10/22/2020 CLINICAL DATA:  Status post TAVR. EXAM: PORTABLE CHEST 1 VIEW COMPARISON:  October 16, 2020 FINDINGS: There is no evidence of acute infiltrate, pleural effusion or pneumothorax. There is mild to moderate severity enlargement of the cardiac silhouette. An artificial aortic valve is seen. Marked severity calcification of the aortic arch is noted. Degenerative changes seen throughout the thoracic spine. IMPRESSION: Status post artificial aortic valve placement without acute or active cardiopulmonary disease. Electronically Signed   By: Virgina Norfolk M.D.   On: 10/22/2020 15:24   ECHOCARDIOGRAM LIMITED  Result Date: 10/22/2020    ECHOCARDIOGRAM LIMITED REPORT   Patient Name:   Cesar [name] Date of Exam: 10/22/2020 Medical Rec #:  [id number]       Height:       73.0 in Accession #:    [id number]      Weight:       169.5 lb Date of Birth:  [birth year]       BSA:          2.006 m Patient Age:    87 years        BP:           49/107 mmHg Patient Gender: M               HR:           69 bpm. Exam Location:  Inpatient Procedure: Limited Echo, Cardiac Doppler and Color Doppler Indications:     Aortic stenosis  History:         Patient has prior history of Echocardiogram examinations, most                  recent 10/17/2020. CAD, Aortic Valve Disease, Arrythmias:Atrial                  Fibrillation, Signs/Symptoms:Syncope; Risk Factors:Hypertension                  and Dyslipidemia.  Sonographer:     Dustin Flock Referring Phys:  [place] Phys: Jenkins Rouge MD IMPRESSIONS  1. Left ventricular ejection fraction, by estimation, is 50 to 55%. The left ventricle has low normal function. There is moderate left ventricular hypertrophy. Left ventricular diastolic function could not be evaluated.  2. The mitral valve is degenerative. Mild mitral valve regurgitation. Severe mitral  annular calcification.  3. Pre TAVR: tri leaflet severely calcified with restricted leaflet motion. Peak gradient 58 mm hg mean 32 mmHg supine in OR AVA 1 cm2 mild AR         Post TAVR: well placed  29 mm Sapien 3 valve. No significant PvL mean gradient 3 peak 5 mmHg AVA 2.84 . The aortic valve has been repaired/replaced. Aortic valve regurgitation is mild. Severe aortic valve stenosis. Procedure Date: 10/17/2020. FINDINGS  Left Ventricle: Left ventricular ejection fraction, by estimation, is 50 to 55%. The left ventricle has low normal function. There is moderate left ventricular hypertrophy. Left ventricular diastolic function could not be evaluated. Mitral Valve: The mitral valve is degenerative in appearance. There is moderate thickening of the mitral valve leaflet(s). There is moderate calcification of the mitral valve leaflet(s). Severe mitral annular calcification. Mild mitral valve regurgitation. Aortic Valve: Pre TAVR: tri leaflet severely calcified with restricted leaflet motion. Peak gradient 58 mm hg mean 32 mmHg supine in OR AVA 1 cm2 mild AR Post TAVR: well placed 29 mm Sapien 3 valve. No significant PvL mean gradient 3 peak 5 mmHg AVA 2.84. The aortic valve has been repaired/replaced. Aortic valve regurgitation is mild. Severe aortic stenosis is present. Aortic valve mean gradient measures 12.7 mmHg. Aortic valve peak gradient measures 16.0 mmHg. Aortic valve area, by VTI measures 2.86 cm. There is a Sapien prosthetic, stented (TAVR) valve present in the aortic position. LEFT VENTRICLE PLAX 2D LVOT diam:     2.90 cm LV SV:         96 LV SV Index:   48 LVOT Area:     6.61 cm  AORTIC VALVE AV Area (Vmax):    2.61 cm AV Area (Vmean):   2.48 cm AV Area (VTI):     2.86 cm AV Vmax:           200.00 cm/s AV Vmean:          136.333 cm/s AV VTI:            0.334 m AV Peak Grad:      16.0 mmHg AV Mean Grad:      12.7 mmHg LVOT Vmax:         79.05 cm/s LVOT Vmean:        51.200 cm/s LVOT VTI:          0.145  m LVOT/AV VTI ratio: 0.43  SHUNTS Systemic VTI:  0.14 m Systemic Diam: 2.90 cm Jenkins Rouge MD Electronically signed by Jenkins Rouge MD Signature Date/Time: 10/22/2020/11:33:58 AM    Final    Structural Heart Procedure  Result Date: 10/22/2020 See surgical note for result.   Cardiac Studies    TAVR OPERATIVE NOTE   Date of Procedure:                10/22/2020  Preoperative Diagnosis:      Severe Aortic Stenosis   Postoperative Diagnosis:    Same   Procedure:        Transcatheter Aortic Valve Replacement - Percutaneous Right Transfemoral Approach             Edwards Sapien 3 THV (size 29 mm, model # 9600TFX, serial # [id number])              Co-Surgeons:                        [name], MD and Valentina Gu. Roxy Manns, MD  Anesthesiologist:                  Hoy Morn, MD  Echocardiographer:              Verlin Dike  Pre-operative Echo Findings: ? Severe aortic stenosis ?  Normal left ventricular systolic function  Post-operative Echo Findings: ? No paravalvular leak ? Normal left ventricular systolic function  ______________________  Echo 10/23/2020: pending    Patient Profile     Cesar [name] is a 87 y.o. 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 [name], AAA, stage III CKD, HLD, OSA and aortic stenosis who was admitted to the hospital following a syncopal event. He was found to have critical AS and structural heart consulted for TAVR.  Assessment & Plan    Severe AS: s/p successful TAVR with a 29 mm Edwards Sapien 3 THV via the TF approach on 10/22/20. Post operative echo pending. Groin sites are stable. ECG with afib and new ILBBB. There has been no high grade heart block. He will be restarted on his Eliquis tonight. I have also added a baby Asprin 81mg  daily which will be continued x 6 months. Close follow up  with structural heart arranged for next week.   Incidental findings: pre TAVR CT showed a cystic 2.6 cm pancreatic body lesion with associated new pancreatic tail atrophy and duct dilation, increased since10/31/2020 CT. 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 MRI abdomen without and with IV contrast is indicated for further characterization. This will be discussed in the outpatient setting.   Signed, Angelena Form, PA-C  10/23/2020, 6:27 AM  Pager [phone]  I have personally seen and examined this patient. I agree with the assessment and plan as outlined above.  He is doing well this am. No complaints. I have personally reviewed telemetry and he has slight widening of the QRS with atrial fib. One 3 second pause this am. Groins stable.  Will ambulate this am. Echo this morning to assess valve but no murmur on exam. Given the incomplete LBBB, will arrange Zio patch prior to discharge.  Ok to discharge this afternoon from our perspective.  We have arrange f/u in the structural heart office next week.   [name] 10/23/2020 8:54 AM

## 2020-10-23 NOTE — Discharge Instructions (Signed)
MEDICATION CHANGES: - START aspirin 81mg  daily - you can pick this up over-the-counter at your local pharmacy - Please restart your eliquis on the evening of 10/23/20 as previously prescribed  ACTIVITY AND EXERCISE . Daily activity and exercise are an important part of your recovery. People recover at different rates depending on their general health and type of valve procedure. . Most people recovering from TAVR feel better relatively quickly  . No lifting, pushing, pulling more than 10 pounds (examples to avoid: groceries, vacuuming, gardening, golfing):             - For one week with a procedure through the groin.             - For six weeks for procedures through the chest wall or neck. NOTE: You will typically see one of our providers 7-14 days after your procedure to discuss [place] the above activities.      DRIVING . Do not drive until you are seen for follow up and cleared by a provider. Generally, we ask patient to not drive for 1 week after their procedure. . If you have been told by your doctor in the past that you may not drive, you must talk with him/her before you begin driving again.   DRESSING . Groin site: you may leave the clear dressing over the site for up to one week or until it falls off.   HYGIENE . If you had a femoral (leg) procedure, you may take a shower when you return home. After the shower, pat the site dry. Do NOT use powder, oils or lotions in your groin area until the site has completely healed. . If you had a chest procedure, you may shower when you return home unless specifically instructed not to by your discharging practitioner.             - DO NOT scrub incision; pat dry with a towel.             - DO NOT apply any lotions, oils, powders to the incision.             - No tub baths / swimming for at least 2 weeks. . If you notice any fevers, chills, increased pain, swelling, bleeding or pus, please contact your doctor.   ADDITIONAL  INFORMATION . If you are going to have an upcoming dental procedure, please contact our office as you will require antibiotics ahead of time to prevent infection on your heart valve.    If you have any questions or concerns you can call the structural heart phone during normal business hours 8am-4pm. If you have an urgent need after hours or weekends please call [phone] to talk to the on call provider for general cardiology. If you have an emergency that requires immediate attention, please call 911.    After TAVR Checklist  Check  Test Description   Follow up appointment in 1-2 weeks  You will see our structural heart physician assistant, Nell Range. Your incision sites will be checked and you will be cleared to drive and resume all normal activities if you are doing well.     1 month echo and follow up  You will have an echo to check on your new heart valve and be seen back in the office by Nell Range. Many times the echo is not read by your appointment time, but Joellen Jersey will call you later that day or the following day to report your  results.   Follow up with your primary cardiologist You will need to be seen by your primary cardiologist in the following 3-6 months after your 1 month appointment in the valve clinic. Often times your Plavix or Aspirin will be discontinued during this time, but this is decided on a case by case basis.    1 year echo and follow up You will have another echo to check on your heart valve after 1 year and be seen back in the office by Nell Range. This your last structural heart visit.   Bacterial endocarditis prophylaxis  You will have to take antibiotics for the rest of your life before all dental procedures (even teeth cleanings) to protect your heart valve. Antibiotics are also required before some surgeries. Please check with your cardiologist before scheduling any surgeries. Also, please make sure to tell us if you have a penicillin allergy as you will  require an alternative antibiotic.

## 2020-10-23 NOTE — Progress Notes (Signed)
CARDIAC REHAB PHASE I   PRE:  Rate/Rhythm: 51 afib    BP: sitting 119/62    SaO2: 95 RA  MODE:  Ambulation: 50 ft outside room   POST:  Rate/Rhythm: 91 afib    BP: sitting 136/73     SaO2: 97 RA  Pt mod assist to stand with gait belt. Used RW, gait belt assist. Pt does not pick up his feet, shuffles. Initially only contact guard however as pt fatigued he needed mod-max assist with gait belt as knees flexed, becoming lower to floor. Had to sit in chair by room door, lounged for it too soon. Rested then walked with mod assist to another chair closer to recliner. Stood again and took few steps to recliner. Pt tends to lounge for seat early.   Sts his breathing is better. He adamantly refuses PT at home. His daughter will stay with him and his wife for a few days. He has RW and rollator. I discussed the importance of using RW instead of cane and the importance of not falling. His daughter now has a gait belt to help. Demonstrated how to walk with RW and sit on seat safely. Pt voiced understanding. Discussed restrictions for groin. [id number]  [place], ACSM 10/23/2020 10:39 AM

## 2020-10-24 ENCOUNTER — Telehealth: Payer: Self-pay | Admitting: Physician Assistant

## 2020-10-24 DIAGNOSIS — Z952 Presence of prosthetic heart valve: Secondary | ICD-10-CM | POA: Diagnosis not present

## 2020-10-24 DIAGNOSIS — R55 Syncope and collapse: Secondary | ICD-10-CM | POA: Diagnosis not present

## 2020-10-24 DIAGNOSIS — I350 Nonrheumatic aortic (valve) stenosis: Secondary | ICD-10-CM | POA: Diagnosis not present

## 2020-10-24 DIAGNOSIS — I447 Left bundle-branch block, unspecified: Secondary | ICD-10-CM | POA: Diagnosis not present

## 2020-10-24 MED FILL — Magnesium Sulfate Inj 50%: INTRAMUSCULAR | Qty: 10 | Status: AC

## 2020-10-24 MED FILL — Heparin Sodium (Porcine) Inj 1000 Unit/ML: INTRAMUSCULAR | Qty: 30 | Status: AC

## 2020-10-24 MED FILL — Potassium Chloride Inj 2 mEq/ML: INTRAVENOUS | Qty: 40 | Status: AC

## 2020-10-24 NOTE — Telephone Encounter (Signed)
°  [place] VALVE TEAM   Patient contacted regarding discharge from [facility] on 1/19  Patient understands to follow up with provider Nell Range on 1/26 at [place].  Patient understands discharge instructions? yes Patient understands medications and regimen? yes Patient understands to bring all medications to this visit? yes  Angelena Form PA-C  [facility]

## 2020-10-25 ENCOUNTER — Telehealth: Payer: Self-pay | Admitting: Physician Assistant

## 2020-10-25 ENCOUNTER — Encounter: Payer: Self-pay | Admitting: Physician Assistant

## 2020-10-25 DIAGNOSIS — Z952 Presence of prosthetic heart valve: Secondary | ICD-10-CM

## 2020-10-25 DIAGNOSIS — Z7901 Long term (current) use of anticoagulants: Secondary | ICD-10-CM

## 2020-10-25 DIAGNOSIS — I4821 Permanent atrial fibrillation: Secondary | ICD-10-CM

## 2020-10-25 DIAGNOSIS — I739 Peripheral vascular disease, unspecified: Secondary | ICD-10-CM

## 2020-10-25 DIAGNOSIS — I350 Nonrheumatic aortic (valve) stenosis: Secondary | ICD-10-CM

## 2020-10-25 DIAGNOSIS — E7800 Pure hypercholesterolemia, unspecified: Secondary | ICD-10-CM

## 2020-10-25 NOTE — Telephone Encounter (Signed)
Roonie, Daughter of the patient called. She got a call from [place] yesterday to follow up from the patient's TAVR procedure that was done. She wanted to know if Joellen Jersey could call her again. She would like her help arranging someone to come to the house to put a Catheter in. The patient is urinating frequently and is very unsteady on his feet. This has caused the patient to urinate on himself often since he has been home.  P

## 2020-10-25 NOTE — Telephone Encounter (Signed)
Can you order a home health RN and PT for him. If you can't do it, then we will discuss when I see him in the office next week.

## 2020-10-25 NOTE — Telephone Encounter (Signed)
Routed to TAVR clinic team/primary cardiologist to advise on home health request/catheter

## 2020-10-25 NOTE — Telephone Encounter (Signed)
HH orders placed Daughter notified Community message sent to [place] contacts Angie Josephina Gip

## 2020-10-26 DIAGNOSIS — G40909 Epilepsy, unspecified, not intractable, without status epilepticus: Secondary | ICD-10-CM | POA: Diagnosis not present

## 2020-10-26 DIAGNOSIS — Z86718 Personal history of other venous thrombosis and embolism: Secondary | ICD-10-CM | POA: Diagnosis not present

## 2020-10-26 DIAGNOSIS — I712 Thoracic aortic aneurysm, without rupture: Secondary | ICD-10-CM | POA: Diagnosis not present

## 2020-10-26 DIAGNOSIS — I4821 Permanent atrial fibrillation: Secondary | ICD-10-CM | POA: Diagnosis not present

## 2020-10-26 DIAGNOSIS — Z48812 Encounter for surgical aftercare following surgery on the circulatory system: Secondary | ICD-10-CM | POA: Diagnosis not present

## 2020-10-26 DIAGNOSIS — I1310 Hypertensive heart and chronic kidney disease without heart failure, with stage 1 through stage 4 chronic kidney disease, or unspecified chronic kidney disease: Secondary | ICD-10-CM | POA: Diagnosis not present

## 2020-10-26 DIAGNOSIS — W19XXXD Unspecified fall, subsequent encounter: Secondary | ICD-10-CM | POA: Diagnosis not present

## 2020-10-26 DIAGNOSIS — Z7901 Long term (current) use of anticoagulants: Secondary | ICD-10-CM | POA: Diagnosis not present

## 2020-10-26 DIAGNOSIS — N183 Chronic kidney disease, stage 3 unspecified: Secondary | ICD-10-CM | POA: Diagnosis not present

## 2020-10-26 DIAGNOSIS — K869 Disease of pancreas, unspecified: Secondary | ICD-10-CM | POA: Diagnosis not present

## 2020-10-26 DIAGNOSIS — I080 Rheumatic disorders of both mitral and aortic valves: Secondary | ICD-10-CM | POA: Diagnosis not present

## 2020-10-26 DIAGNOSIS — R918 Other nonspecific abnormal finding of lung field: Secondary | ICD-10-CM | POA: Diagnosis not present

## 2020-10-26 DIAGNOSIS — I2510 Atherosclerotic heart disease of native coronary artery without angina pectoris: Secondary | ICD-10-CM | POA: Diagnosis not present

## 2020-10-26 DIAGNOSIS — Z952 Presence of prosthetic heart valve: Secondary | ICD-10-CM | POA: Diagnosis not present

## 2020-10-26 DIAGNOSIS — I739 Peripheral vascular disease, unspecified: Secondary | ICD-10-CM | POA: Diagnosis not present

## 2020-10-26 DIAGNOSIS — I447 Left bundle-branch block, unspecified: Secondary | ICD-10-CM | POA: Diagnosis not present

## 2020-10-26 DIAGNOSIS — Z9861 Coronary angioplasty status: Secondary | ICD-10-CM | POA: Diagnosis not present

## 2020-10-26 DIAGNOSIS — G4733 Obstructive sleep apnea (adult) (pediatric): Secondary | ICD-10-CM | POA: Diagnosis not present

## 2020-10-26 DIAGNOSIS — Z8673 Personal history of transient ischemic attack (TIA), and cerebral infarction without residual deficits: Secondary | ICD-10-CM | POA: Diagnosis not present

## 2020-10-26 DIAGNOSIS — Z7982 Long term (current) use of aspirin: Secondary | ICD-10-CM | POA: Diagnosis not present

## 2020-10-26 DIAGNOSIS — R55 Syncope and collapse: Secondary | ICD-10-CM | POA: Diagnosis not present

## 2020-10-26 DIAGNOSIS — Z791 Long term (current) use of non-steroidal anti-inflammatories (NSAID): Secondary | ICD-10-CM | POA: Diagnosis not present

## 2020-10-26 DIAGNOSIS — E785 Hyperlipidemia, unspecified: Secondary | ICD-10-CM | POA: Diagnosis not present

## 2020-10-26 DIAGNOSIS — Z9181 History of falling: Secondary | ICD-10-CM | POA: Diagnosis not present

## 2020-10-27 DIAGNOSIS — I080 Rheumatic disorders of both mitral and aortic valves: Secondary | ICD-10-CM | POA: Diagnosis not present

## 2020-10-27 DIAGNOSIS — I2510 Atherosclerotic heart disease of native coronary artery without angina pectoris: Secondary | ICD-10-CM | POA: Diagnosis not present

## 2020-10-27 DIAGNOSIS — N183 Chronic kidney disease, stage 3 unspecified: Secondary | ICD-10-CM | POA: Diagnosis not present

## 2020-10-27 DIAGNOSIS — I4821 Permanent atrial fibrillation: Secondary | ICD-10-CM | POA: Diagnosis not present

## 2020-10-27 DIAGNOSIS — Z48812 Encounter for surgical aftercare following surgery on the circulatory system: Secondary | ICD-10-CM | POA: Diagnosis not present

## 2020-10-27 DIAGNOSIS — I1310 Hypertensive heart and chronic kidney disease without heart failure, with stage 1 through stage 4 chronic kidney disease, or unspecified chronic kidney disease: Secondary | ICD-10-CM | POA: Diagnosis not present

## 2020-10-28 ENCOUNTER — Telehealth: Payer: Self-pay | Admitting: Cardiovascular Disease

## 2020-10-28 ENCOUNTER — Encounter: Payer: Self-pay | Admitting: Cardiovascular Disease

## 2020-10-28 DIAGNOSIS — I4821 Permanent atrial fibrillation: Secondary | ICD-10-CM

## 2020-10-28 MED ORDER — APIXABAN 2.5 MG PO TABS: 2.5000 mg | ORAL_TABLET | Freq: Two times a day (BID) | ORAL | 1 refills | Status: DC

## 2020-10-28 NOTE — Telephone Encounter (Signed)
Prescription refill request for Eliquis received. Indication: a fib Last office visit: 10/13/20 Scr: 1.83 Age: 87 Weight: 83kg

## 2020-10-28 NOTE — Telephone Encounter (Signed)
*  STAT* If patient is at the pharmacy, call can be transferred to refill team.   1. Which medications need to be refilled? (please list name of each medication and dose if known) apixaban (ELIQUIS) 5 MG TABS tablet  2. Which pharmacy/location (including street and city if local pharmacy) is medication to be sent to? [place], [place]  3. Do they need a 30 day or 90 day supply? 90 day

## 2020-10-28 NOTE — Addendum Note (Signed)
Addended by: Rollen Sox on: 10/28/2020 02:04 PM   Modules accepted: Orders

## 2020-10-28 NOTE — Telephone Encounter (Signed)
Do not recommend cutting Eliquis tablets in half.  New prescription was sent to patient's pharmacy

## 2020-10-28 NOTE — Telephone Encounter (Signed)
Called and spoke with daughter and advised of dose decrease

## 2020-10-28 NOTE — Telephone Encounter (Signed)
Wife updated and verbalized understanding.  

## 2020-10-28 NOTE — Telephone Encounter (Signed)
Patient's wife calling back. She states she called the pharmacy and they have not received the medication. She would like to know if he does not receive the medication will it be okay for him to take the 5 mg today.

## 2020-10-28 NOTE — Telephone Encounter (Signed)
Thanks. Please tell them not to discard the 5 mg tabs. If his renal function returns to baseline creat of 1.3-1.4 (I suspect it gradually will), we will be returning to the 5 mg dose.

## 2020-10-28 NOTE — Addendum Note (Signed)
Addended by: Rollen Sox on: 10/28/2020 11:50 AM   Modules accepted: Orders

## 2020-10-28 NOTE — Telephone Encounter (Signed)
Patient's wife calling in regards to medication change. She states the patient still has 25 tablets of the 5 mg eliquis. She would like to know if he can use up the medication, by cutting them in half, or if the 2.5 mg will be sent to his pharmacy. She states it is okay to leave the answer on her machine.

## 2020-10-28 NOTE — Telephone Encounter (Signed)
error 

## 2020-10-28 NOTE — Telephone Encounter (Signed)
Call placed to the pharmacy. They have confirmed that they have it and it is ready. The patient's wife has been made aware.

## 2020-10-28 NOTE — Telephone Encounter (Signed)
Patient qualifies for Eliquis 2.5mg  BID due to age and renal function

## 2020-10-29 ENCOUNTER — Other Ambulatory Visit (HOSPITAL_COMMUNITY): Payer: Medicare Other

## 2020-10-29 DIAGNOSIS — N183 Chronic kidney disease, stage 3 unspecified: Secondary | ICD-10-CM | POA: Diagnosis not present

## 2020-10-29 DIAGNOSIS — I1310 Hypertensive heart and chronic kidney disease without heart failure, with stage 1 through stage 4 chronic kidney disease, or unspecified chronic kidney disease: Secondary | ICD-10-CM | POA: Diagnosis not present

## 2020-10-29 DIAGNOSIS — I4821 Permanent atrial fibrillation: Secondary | ICD-10-CM | POA: Diagnosis not present

## 2020-10-29 DIAGNOSIS — I080 Rheumatic disorders of both mitral and aortic valves: Secondary | ICD-10-CM | POA: Diagnosis not present

## 2020-10-29 DIAGNOSIS — Z48812 Encounter for surgical aftercare following surgery on the circulatory system: Secondary | ICD-10-CM | POA: Diagnosis not present

## 2020-10-29 DIAGNOSIS — I2510 Atherosclerotic heart disease of native coronary artery without angina pectoris: Secondary | ICD-10-CM | POA: Diagnosis not present

## 2020-10-30 ENCOUNTER — Ambulatory Visit (INDEPENDENT_AMBULATORY_CARE_PROVIDER_SITE_OTHER): Payer: Medicare Other | Admitting: Physician Assistant

## 2020-10-30 ENCOUNTER — Other Ambulatory Visit: Payer: Self-pay

## 2020-10-30 ENCOUNTER — Encounter: Payer: Self-pay | Admitting: Physician Assistant

## 2020-10-30 VITALS — BP 130/60 | HR 96 | Ht 73.0 in | Wt 191.0 lb

## 2020-10-30 DIAGNOSIS — I716 Thoracoabdominal aortic aneurysm, without rupture, unspecified: Secondary | ICD-10-CM

## 2020-10-30 DIAGNOSIS — I2510 Atherosclerotic heart disease of native coronary artery without angina pectoris: Secondary | ICD-10-CM

## 2020-10-30 DIAGNOSIS — Z952 Presence of prosthetic heart valve: Secondary | ICD-10-CM

## 2020-10-30 DIAGNOSIS — N183 Chronic kidney disease, stage 3 unspecified: Secondary | ICD-10-CM | POA: Diagnosis not present

## 2020-10-30 DIAGNOSIS — I4821 Permanent atrial fibrillation: Secondary | ICD-10-CM | POA: Diagnosis not present

## 2020-10-30 DIAGNOSIS — K869 Disease of pancreas, unspecified: Secondary | ICD-10-CM

## 2020-10-30 MED ORDER — AMOXICILLIN 500 MG PO TABS: ORAL_TABLET | ORAL | 11 refills | Status: DC

## 2020-10-30 NOTE — Progress Notes (Signed)
[place]                                     Cardiology Office Note:    Date:  10/30/2020   ID:  Cesar [name], DOB [birth year], MRN [id number]  PCP:  Orpah Melter, MD  [facility] Cardiologist:  [name], MD / Dr. Angelena Form & Dr. Roxy Manns (TAVR) [place] Electrophysiologist:  None   Referring MD: No ref. provider found   TOC s/p TAVR  History of Present Illness:    Cesar [name] is a 87 y.o. 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 [name], AAA, stage III CKD, HLD, OSA and aortic stenosis s/p TAVR 10/22/20 who presents to clinic for follow up.   He was recently admitted from 1/12-1/19/22 for syncope and diagnosed with critical AS. L/RHC showed mild to moderate coronary artery disease, including 40-50% ostial LMCA, sequential 20% proximal and 10-20% distal LAD lesions, and 30% ostial RCA stenosis. The structural heart team was consulted and he underwent successful TAVR with a 29 mm Edwards Sapien 3 THV via the TF approach on 10/22/20. Post operative echo showed EF 55%, normally functioning TAVR with a mean gradient of 12 mm hg and trivial PVL. He was discharged home on his home eliquis with the addition of a baby aspirin 81 mg daily. 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 Creat did bump a little at discharge and his Eliquis was decreased to 2.10m BID.   His daughter called into the office after discharge to [facility] to gait imbalance and catheter placement due to incontinence.  Today he presents to clinic for follow up. Here with wife. [name] well. Hard of hearing. Refused PT. No CP or SOB. No LE edema, orthopnea or PND. No dizziness or syncope. No blood in stool or urine. No  palpitations. Wants to be back riding his tractor but family does not want him to due to his gait instability and fall risk.   Past Medical History:  Diagnosis Date  . AAA (abdominal aortic aneurysm) ([place]   . CKD (chronic kidney disease), stage III (HCC)    baseline Cr 1.4-1.5 in 2016-2017 by PCP)  . CVA (cerebral infarction)   . Depression   . DVT (deep venous thrombosis) ([place] 2006  . H/O blood clots   . Hematuria   . HOH (hard of hearing)   . Hypercholesterolemia   . OSA (obstructive sleep apnea)    intol to CPAP  . Permanent atrial fibrillation ([place]   . Prostate enlargement   . Pulmonary nodules   . 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 MBuena Irishand Dr. ORoxy Manns . Thoracic ascending aortic aneurysm ([place]    4.5cm in 11/2018, stable since 2016  . TIA (transient ischemic attack)     Past Surgical History:  Procedure Laterality Date  . EMBOLECTOMY Left 07/02/2016   Procedure: EMBOLECTOMY LEFT FEMORAL ARTERY;  Surgeon: TRosetta Posner MD;  [place] [facility]  Service: Vascular;  Laterality: Left;  .Marland KitchenMESENTERIC ARTERY BYPASS N/A 08/06/2019  Procedure: SUPERIOR MESENTERIC ARTERY THROMBECTOMY;  Surgeon: [name], MD;  Location: [place];  Service: Vascular;  Laterality: N/A;  . NM MYOCAR PERF WALL MOTION  08/24/2002   markedly positive,subtle lateral ischemia  . PATCH ANGIOPLASTY  08/06/2019   Procedure: Patch Angioplasty of superior messenteric artery with RIGHT femoral vein;  Surgeon: [name], MD;  Location: [place];  Service: Vascular;;  . PROSTATE SURGERY    . RIGHT HEART CATH AND CORONARY ANGIOGRAPHY N/A 10/21/2020   Procedure: RIGHT HEART CATH AND CORONARY ANGIOGRAPHY;  Surgeon: [name], MD;  Location: [place] CV LAB;  Service: Cardiovascular;  Laterality: N/A;  . TEE WITHOUT CARDIOVERSION N/A 10/22/2020   Procedure: TRANSESOPHAGEAL ECHOCARDIOGRAM (TEE);  Surgeon:  [name], MD;  Location: [place];  Service: Open Heart Surgery;  Laterality: N/A;  . TRANSCATHETER AORTIC VALVE REPLACEMENT, TRANSFEMORAL Right 10/22/2020   Procedure: TRANSCATHETER AORTIC VALVE REPLACEMENT, TRANSFEMORAL;  Surgeon: [name], MD;  Location: [place];  Service: Open Heart Surgery;  Laterality: Right;  . US ECHOCARDIOGRAPHY  01/06/2012   mod. AOV ca+,mild to mod. AS,mod mostly posterior MAC-mild MR    Current Medications: Current Meds  Medication Sig  . acetaminophen (TYLENOL) 500 MG tablet as needed.  Marland Kitchen amoxicillin (AMOXIL) 500 MG tablet Take 4 tablets (2000 mg) one hour prior to all dental visits.  Marland Kitchen apixaban (ELIQUIS) 2.5 MG TABS tablet Take 1 tablet (2.5 mg total) by mouth 2 (two) times daily.  Marland Kitchen aspirin 81 MG chewable tablet Chew 1 tablet (81 mg total) by mouth daily.  Marland Kitchen atorvastatin (LIPITOR) 80 MG tablet Take 80 mg by mouth daily.  Marland Kitchen b complex vitamins capsule Take 1 capsule by mouth daily.  . divalproex (DEPAKOTE ER) 500 MG 24 hr tablet Take 1 tablet (500 mg total) by mouth at bedtime.  . finasteride (PROSCAR) 5 MG tablet Take 5 mg by mouth daily.     Allergies:   Flomax [tamsulosin hcl] and Levetiracetam   Social History   Socioeconomic History  . Marital status: Married    Spouse name: Not on file  . Number of children: 2  . Years of education: 6th grade  . Highest education level: Not on file  Occupational History  . Occupation: Retired - lives on 9 acres of land  Tobacco Use  . Smoking status: Former Smoker    Packs/day: 1.00    Years: 10.00    Pack years: 10.00    Types: Cigarettes    Quit date: 10/05/1952    Years since quitting: 68.1  . Smokeless tobacco: Never Used  Vaping Use  . Vaping Use: Never used  Substance and Sexual Activity  . Alcohol use: Not Currently    Comment: occasional beer  . Drug use: Never  . Sexual activity: Never  Other Topics Concern  . Not on file  Social History Narrative   ** Merged History  Encounter **       Lives at home with his wife. Left-handed. Caffeine use: 2-3 P[employer] per day   Social Determinants of Health   Financial Resource Strain: Not on file  Food Insecurity: Not on file  Transportation Needs: No Transportation Needs  . Lack of Transportation (Medical): No  . Lack of Transportation (Non-Medical): No  Physical Activity: Not on file  Stress: Not on file  Social Connections: Not on file     Family History: The patient's family history includes Diabetes in his father; Heart failure in his mother; Stroke in his  father.  ROS:   Please see the history of present illness.    All other systems reviewed and are negative.  EKGs/Labs/Other Studies Reviewed:    The following studies were reviewed today:   TAVR OPERATIVE NOTE   Date of Procedure:10/22/2020  Preoperative Diagnosis:Severe Aortic Stenosis   Postoperative Diagnosis:Same   Procedure:   Transcatheter Aortic Valve Replacement - PercutaneousRightTransfemoral Approach Edwards Sapien 3 THV (size 53m, model # 9600TFX, serial #[id number]  Co-Surgeons:Christopher MAngelena Form MD andClarence H. ORoxy Manns MD  Anesthesiologist:Stephen TGifford Shave MD  Echocardiographer:Peter Nishan,MD  Pre-operative Echo Findings: ? Severe aortic stenosis ? Normalleft ventricular systolic function  Post-operative Echo Findings: ? Noparavalvular leak ? Normalleft ventricular systolic function  ______________________  Echo 10/23/2020:  IMPRESSIONS  1. Left ventricular ejection fraction, by estimation, is 55%. The left  ventricle has normal function. The left ventricle has no regional wall  motion abnormalities. There is mild left ventricular hypertrophy. Left  ventricular diastolic parameters are  indeterminate.  2. Right ventricular systolic function is normal. The right ventricular   size is normal.  3. Left atrial size was severely dilated.  4. Right atrial size was moderately dilated.  5. The mitral valve is degenerative. Mild mitral valve regurgitation. No  evidence of mitral stenosis. Severe mitral annular calcification.  6. Post TAVR 10/22/20 with 29 mm Sapien 3 valve mean gradient 12 peak 22  mmHg with aVA 2.4 cm2 trivial PVL seen only on PSSA images at 3:00 . The  aortic valve has been repaired/replaced. Aortic valve regurgitation is  trivial. No aortic stenosis is  present. There is a 29 mm Sapien prosthetic (TAVR) valve present in the  aortic position. Procedure Date: 10/22/2020.  7. Aortic dilatation noted. There is moderate dilatation of the aortic  root, measuring 43 mm.  8. The inferior vena cava is normal in size with greater than 50%  respiratory variability, suggesting right atrial pressure of 3 mmHg.    EKG:  EKG is ordered today.  The ekg ordered today demonstrates afib with HR 103, QRS 110. Ms.   Recent Labs: 10/16/2020: TSH 2.149 10/22/2020: ALT 28 10/23/2020: BUN 37; Creatinine, Ser 1.83; Hemoglobin 11.4; Magnesium 1.9; Platelets 100; Potassium 5.4; Sodium 133  Recent Lipid Panel    Component Value Date/Time   CHOL 120 10/21/2020 0311   TRIG 53 10/21/2020 0311   HDL 62 10/21/2020 0311   CHOLHDL 1.9 10/21/2020 0311   VLDL 11 10/21/2020 0311   LDLCALC 47 10/21/2020 0311     Risk Assessment/Calculations:    CHA2DS2-VASc Score = 3  This indicates a 3.2% annual risk of stroke. The patient's score is based upon: CHF History: No HTN History: Yes Diabetes History: No Stroke History: No Vascular Disease History: No Age Score: 2 Gender Score: 0    Physical Exam:    VS:  BP 130/60   Pulse 96   Ht _0  (1.854 m)   Wt 191 lb (86.6 kg)   SpO2 98%   BMI 25.20 kg/m     Wt Readings from Last 3 Encounters:  10/30/20 191 lb (86.6 kg)  10/23/20 183 lb 3.2 oz (83.1 kg)  10/02/20 196 lb 3.2 oz (89 kg)     GEN:  Well nourished,  well developed in no acute distress HEENT: Normal NECK: No JVD; No carotid bruits LYMPHATICS: No lymphadenopathy CARDIAC: irreg irreg. no murmurs, rubs, gallops RESPIRATORY:  Clear to auscultation without rales, wheezing or rhonchi  ABDOMEN: Soft, non-tender, non-distended MUSCULOSKELETAL:  No edema; No deformity  SKIN: Warm  and dry.  Groin sites clear without hematoma or ecchymosis  NEUROLOGIC:  Alert and oriented x 3 PSYCHIATRIC:  Normal affect   ASSESSMENT:    1. S/P TAVR (transcatheter aortic valve replacement)   2. Permanent atrial fibrillation ([place])   3. Coronary artery disease involving native heart without angina pectoris, unspecified vessel or lesion type   4. Stage 3 chronic kidney disease, unspecified whether stage 3a or 3b CKD ([place])   5. Thoracoabdominal aortic aneurysm (TAAA) without rupture ([place])   6. Pancreatic lesion    PLAN:    In order of problems listed above:  Severe AS s/p TAVR:[name] well. Groin sites healing well. ECG with afib and no HAVB. Wearing a Zio patch. Continue aspirin and Eliquis. SBE prophylaxis discussed; I have RX'd amoxicillin. I will see him back for 1 month echo and follow up in feb  Chronic atrial fibrillation: rates 90-low 100s without the aid of AV nodal blocking agents. Back on Eliquis. Dose changed to lower dose with rise in creat.Will recheck BMET today and flip back to normal dose if creat back in baseline range  Non-obstructive GGY:IRSWNI cath showed 45% LM, 20% pLAD, and 40% pRCA stenosis. Continue medical therapy with aspirin and statin.   CKD stage 3:Cr bumped a bit following TAVR to 1.83 on the day of discharge. Likely related to contrast dye given during procedure. Will recheck BMET today  TAA: 65m. Followed by Dr. BCyndia Bent   Pancreatic lesion: pre TAVR CT showed a cystic 2.6 cm pancreatic body lesion with associated new pancreatic tail atrophy and duct dilation, increased since 08/05/2019 CT. Findings are suspicious for  cystic pancreatic neoplasm. MRI abdomen without and with IV contrast is indicated for further characterization. This was discussed with the pt and family today and they will think about follow up imaging. Will re-discuss at 1 month follow up.    Medication Adjustments/Labs and Tests Ordered: Current medicines are reviewed at length with the patient today.  Concerns regarding medicines are outlined above.  Orders Placed This Encounter  Procedures  . Basic metabolic panel  . EKG 12-Lead   Meds ordered this encounter  Medications  . amoxicillin (AMOXIL) 500 MG tablet    Sig: Take 4 tablets (2000 mg) one hour prior to all dental visits.    Dispense:  8 tablet    Refill:  11    Patient Instructions  Medication Instructions:  1) Your provider discussed the importance of taking an antibiotic prior to all dental visits to prevent damage to the heart valves from infection. You were given a prescription for AMOXIL 2,000 mg to take one hour prior to any dental appointment.  *If you need a refill on your cardiac medications before your next appointment, please call your pharmacy*  Lab Work: TODAY! BMET If you have labs (blood work) drawn today and your tests are completely normal, you will receive your results only by: .Marland KitchenMyChart Message (if you have MyChart) OR . A paper copy in the mail If you have any lab test that is abnormal or we need to change your treatment, we will call you to review the results.  Follow-Up: Please keep your appointments as scheduled.     Signed, KAngelena Form PA-C  10/30/2020 3:25 PM    [facility]

## 2020-10-30 NOTE — Patient Instructions (Signed)
Medication Instructions:  1) Your provider discussed the importance of taking an antibiotic prior to all dental visits to prevent damage to the heart valves from infection. You were given a prescription for AMOXIL 2,000 mg to take one hour prior to any dental appointment.  *If you need a refill on your cardiac medications before your next appointment, please call your pharmacy*  Lab Work: TODAY! BMET If you have labs (blood work) drawn today and your tests are completely normal, you will receive your results only by: Marland Kitchen MyChart Message (if you have MyChart) OR . A paper copy in the mail If you have any lab test that is abnormal or we need to change your treatment, we will call you to review the results.  Follow-Up: Please keep your appointments as scheduled.

## 2020-10-31 ENCOUNTER — Telehealth: Payer: Self-pay | Admitting: Cardiovascular Disease

## 2020-10-31 ENCOUNTER — Telehealth: Payer: Self-pay | Admitting: Physician Assistant

## 2020-10-31 ENCOUNTER — Telehealth: Payer: Self-pay | Admitting: *Deleted

## 2020-10-31 DIAGNOSIS — F322 Major depressive disorder, single episode, severe without psychotic features: Secondary | ICD-10-CM | POA: Diagnosis not present

## 2020-10-31 DIAGNOSIS — N400 Enlarged prostate without lower urinary tract symptoms: Secondary | ICD-10-CM | POA: Diagnosis not present

## 2020-10-31 DIAGNOSIS — I4891 Unspecified atrial fibrillation: Secondary | ICD-10-CM | POA: Diagnosis not present

## 2020-10-31 DIAGNOSIS — I639 Cerebral infarction, unspecified: Secondary | ICD-10-CM | POA: Diagnosis not present

## 2020-10-31 DIAGNOSIS — E7800 Pure hypercholesterolemia, unspecified: Secondary | ICD-10-CM | POA: Diagnosis not present

## 2020-10-31 LAB — BASIC METABOLIC PANEL
BUN/Creatinine Ratio: 22 (ref 10–24)
BUN: 28 mg/dL — ABNORMAL HIGH (ref 8–27)
CO2: 23 mmol/L (ref 20–29)
Calcium: 8.8 mg/dL (ref 8.6–10.2)
Chloride: 104 mmol/L (ref 96–106)
Creatinine, Ser: 1.28 mg/dL — ABNORMAL HIGH (ref 0.76–1.27)
GFR calc Af Amer: 57 mL/min/{1.73_m2} — ABNORMAL LOW (ref >59)
GFR calc non Af Amer: 49 mL/min/{1.73_m2} — ABNORMAL LOW (ref >59)
Glucose: 119 mg/dL — ABNORMAL HIGH (ref 65–99)
Potassium: 4.9 mmol/L (ref 3.5–5.2)
Sodium: 141 mmol/L (ref 134–144)

## 2020-10-31 MED ORDER — APIXABAN 5 MG PO TABS: 5.0000 mg | ORAL_TABLET | Freq: Two times a day (BID) | ORAL | 3 refills | Status: DC

## 2020-10-31 NOTE — Telephone Encounter (Signed)
Left message for patient that abx were called in yesterday and instructions are on both AVS and on bottle and to call back if they still have questions.

## 2020-10-31 NOTE — Telephone Encounter (Signed)
-----   Message from Eileen Stanford, PA-C sent at 10/31/2020  7:34 AM EST ----- Creatinine back to his baseline. Can you let him know to start back on 5mg  tablets of Eliquis and update med list? Thanks!

## 2020-10-31 NOTE — Telephone Encounter (Signed)
Attempted to return call but hung up after several rings as no VM answered to leave message. Rx was called in to [facility] yesterday and instructions are on AVS. Will try to contact patient again later.

## 2020-10-31 NOTE — Telephone Encounter (Signed)
Updated medication list and sent in refills to [place]. Patient verbalized understanding.

## 2020-10-31 NOTE — Telephone Encounter (Signed)
Please go ahead. Thanks.

## 2020-10-31 NOTE — Telephone Encounter (Signed)
Dose has been changed back to 5 mg bid. See epic note for details.

## 2020-10-31 NOTE — Telephone Encounter (Signed)
Cesar [name] is calling requesting skilled nursing hours 1 time a week for 9 weeks. She states the family is also requesting a condom catheter so she is requesting orders for that as well. Please advise.

## 2020-10-31 NOTE — Telephone Encounter (Signed)
New message:     Patient wife calling stating that they suppose to get a antibiotic, they would like for some one to call concering the medication.

## 2020-11-01 MED ORDER — RIVAROXABAN 15 MG PO TABS: 15.0000 mg | ORAL_TABLET | Freq: Every day | ORAL | 3 refills | Status: DC

## 2020-11-01 NOTE — Addendum Note (Signed)
Addended by: [name] on: 11/01/2020 02:36 PM   Modules accepted: Orders

## 2020-11-01 NOTE — Telephone Encounter (Signed)
[place] informed the patient that his insurance will no longer cover Eliquis, but will cover Xarelto. Xarelto 15 mg once daily taken with a meal (dose adjusted for GFR less than 50), ordered electronically. Discussed instructions with patient's wife [name].

## 2020-11-04 DIAGNOSIS — I4821 Permanent atrial fibrillation: Secondary | ICD-10-CM | POA: Diagnosis not present

## 2020-11-04 DIAGNOSIS — I080 Rheumatic disorders of both mitral and aortic valves: Secondary | ICD-10-CM | POA: Diagnosis not present

## 2020-11-04 DIAGNOSIS — N183 Chronic kidney disease, stage 3 unspecified: Secondary | ICD-10-CM | POA: Diagnosis not present

## 2020-11-04 DIAGNOSIS — I2510 Atherosclerotic heart disease of native coronary artery without angina pectoris: Secondary | ICD-10-CM | POA: Diagnosis not present

## 2020-11-04 DIAGNOSIS — I1310 Hypertensive heart and chronic kidney disease without heart failure, with stage 1 through stage 4 chronic kidney disease, or unspecified chronic kidney disease: Secondary | ICD-10-CM | POA: Diagnosis not present

## 2020-11-04 DIAGNOSIS — Z48812 Encounter for surgical aftercare following surgery on the circulatory system: Secondary | ICD-10-CM | POA: Diagnosis not present

## 2020-11-04 NOTE — Telephone Encounter (Signed)
Donita notified, verbalized understanding

## 2020-11-07 DIAGNOSIS — F322 Major depressive disorder, single episode, severe without psychotic features: Secondary | ICD-10-CM | POA: Diagnosis not present

## 2020-11-07 DIAGNOSIS — E7800 Pure hypercholesterolemia, unspecified: Secondary | ICD-10-CM | POA: Diagnosis not present

## 2020-11-07 DIAGNOSIS — I4891 Unspecified atrial fibrillation: Secondary | ICD-10-CM | POA: Diagnosis not present

## 2020-11-07 DIAGNOSIS — N400 Enlarged prostate without lower urinary tract symptoms: Secondary | ICD-10-CM | POA: Diagnosis not present

## 2020-11-07 DIAGNOSIS — I639 Cerebral infarction, unspecified: Secondary | ICD-10-CM | POA: Diagnosis not present

## 2020-11-08 ENCOUNTER — Ambulatory Visit: Payer: Medicare Other | Admitting: General Practice

## 2020-11-12 DIAGNOSIS — N183 Chronic kidney disease, stage 3 unspecified: Secondary | ICD-10-CM | POA: Diagnosis not present

## 2020-11-12 DIAGNOSIS — I2510 Atherosclerotic heart disease of native coronary artery without angina pectoris: Secondary | ICD-10-CM | POA: Diagnosis not present

## 2020-11-12 DIAGNOSIS — I4821 Permanent atrial fibrillation: Secondary | ICD-10-CM | POA: Diagnosis not present

## 2020-11-12 DIAGNOSIS — I1310 Hypertensive heart and chronic kidney disease without heart failure, with stage 1 through stage 4 chronic kidney disease, or unspecified chronic kidney disease: Secondary | ICD-10-CM | POA: Diagnosis not present

## 2020-11-12 DIAGNOSIS — Z48812 Encounter for surgical aftercare following surgery on the circulatory system: Secondary | ICD-10-CM | POA: Diagnosis not present

## 2020-11-12 DIAGNOSIS — I080 Rheumatic disorders of both mitral and aortic valves: Secondary | ICD-10-CM | POA: Diagnosis not present

## 2020-11-14 ENCOUNTER — Encounter: Payer: Self-pay | Admitting: Physician Assistant

## 2020-11-14 ENCOUNTER — Ambulatory Visit (HOSPITAL_COMMUNITY): Payer: Medicare Other | Attending: Internal Medicine

## 2020-11-14 ENCOUNTER — Ambulatory Visit (INDEPENDENT_AMBULATORY_CARE_PROVIDER_SITE_OTHER): Payer: Medicare Other | Admitting: Physician Assistant

## 2020-11-14 ENCOUNTER — Other Ambulatory Visit: Payer: Self-pay

## 2020-11-14 VITALS — BP 128/80 | HR 77 | Ht 73.0 in | Wt 197.2 lb

## 2020-11-14 DIAGNOSIS — K869 Disease of pancreas, unspecified: Secondary | ICD-10-CM | POA: Diagnosis not present

## 2020-11-14 DIAGNOSIS — I4821 Permanent atrial fibrillation: Secondary | ICD-10-CM | POA: Diagnosis not present

## 2020-11-14 DIAGNOSIS — I716 Thoracoabdominal aortic aneurysm, without rupture, unspecified: Secondary | ICD-10-CM

## 2020-11-14 DIAGNOSIS — I2510 Atherosclerotic heart disease of native coronary artery without angina pectoris: Secondary | ICD-10-CM | POA: Diagnosis not present

## 2020-11-14 DIAGNOSIS — Z952 Presence of prosthetic heart valve: Secondary | ICD-10-CM

## 2020-11-14 DIAGNOSIS — N183 Chronic kidney disease, stage 3 unspecified: Secondary | ICD-10-CM

## 2020-11-14 LAB — ECHOCARDIOGRAM COMPLETE
AR max vel: 2.25 cm2
AV Area VTI: 2.36 cm2
AV Area mean vel: 2.24 cm2
AV Mean grad: 8 mmHg
AV Peak grad: 12.8 mmHg
Ao pk vel: 1.79 m/s
MV VTI: 3.23 cm2
S' Lateral: 3.5 cm

## 2020-11-14 NOTE — Progress Notes (Signed)
[place]                                     Cardiology Office Note:    Date:  11/14/2020   ID:  Cesar [name], DOB 1929-01-20, MRN [id number]  PCP:  Orpah Melter, MD  [facility] Cardiologist:  [name], MD / Dr. Angelena Form & Dr. Roxy Manns (TAVR) [place] Electrophysiologist:  None   Referring MD: No ref. provider found   1 month s/p TAVR  History of Present Illness:    Cesar [name] is a 87 y.o. 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 [name], AAA, stage III CKD, HLD, OSA and aortic stenosis s/p TAVR 10/22/20 who presents to clinic for follow up.   He was recently admitted from 1/12-1/19/22 for syncope and diagnosed with critical AS. L/RHC showed mild to moderate coronary artery disease, including 40-50% ostial LMCA, sequential 20% proximal and 10-20% distal LAD lesions, and 30% ostial RCA stenosis. The structural heart team was consulted and he underwent successful TAVR with a 29 mm Edwards Sapien 3 THV via the TF approach on 10/22/20. Post operative echo showed EF 55%, normally functioning TAVR with a mean gradient of 12 mm hg and trivial PVL. He was discharged home on his home eliquis with the addition of a baby aspirin 81 mg daily. 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 Creat did bump a little at discharge and his Eliquis was decreased to 2.50m BID.   His daughter called into the office after discharge to report [facility] due to gait imbalance and catheter placement due to incontinence. The pt refused both. At post follow up, labs were rechecked which showed creat back to baseline and he was restarted on Eliquis 528mBID.  Today he presents to clinic for follow up. No CP or SOB. No LE  edema, orthopnea or PND. No dizziness or syncope. No blood in stool or urine. No palpitations. He has been back on his tractor despite his family's wishes. Wife here with him. Very upset bc insurance wouldn't cover Eliquis anymore and switched to Xarelto which cost her over $600. In tears over this.    Past Medical History:  Diagnosis Date  . AAA (abdominal aortic aneurysm) ([facility]  . CKD (chronic kidney disease), stage III (HCC)    baseline Cr 1.4-1.5 in 2016-2017 by PCP)  . CVA (cerebral infarction)   . Depression   . DVT (deep venous thrombosis) ([facility]  . H/O blood clots   . Hematuria   . HOH (hard of hearing)   . Hypercholesterolemia   . OSA (obstructive sleep apnea)    intol to CPAP  . Permanent atrial fibrillation ([facility] [place]  . Prostate enlargement   . Pulmonary nodules   . 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 McBuena Irishnd Dr. OwRoxy Manns. Thoracic ascending aortic aneurysm ([facility]   4.5cm in 11/2018, stable since 2016  . TIA (transient ischemic attack)     Past Surgical History:  Procedure Laterality Date  . EMBOLECTOMY Left 07/02/2016  Procedure: EMBOLECTOMY LEFT FEMORAL ARTERY;  Surgeon: [name], MD;  Location: [place];  Service: Vascular;  Laterality: Left;  Marland Kitchen MESENTERIC ARTERY BYPASS N/A 08/06/2019   Procedure: SUPERIOR MESENTERIC ARTERY THROMBECTOMY;  Surgeon: [name], MD;  Location: [place];  Service: Vascular;  Laterality: N/A;  . NM [place]  08/24/2002   markedly positive,subtle lateral ischemia  . PATCH ANGIOPLASTY  08/06/2019   Procedure: Patch Angioplasty of superior messenteric artery with RIGHT femoral vein;  Surgeon: [name], MD;  Location: [place];  Service: Vascular;;  . PROSTATE SURGERY    . RIGHT HEART CATH AND CORONARY ANGIOGRAPHY N/A 10/21/2020   Procedure: RIGHT HEART CATH AND CORONARY ANGIOGRAPHY;  Surgeon: [name], MD;   Location: [place] [facility];  Service: Cardiovascular;  Laterality: N/A;  . TEE WITHOUT CARDIOVERSION N/A 10/22/2020   Procedure: TRANSESOPHAGEAL ECHOCARDIOGRAM (TEE);  Surgeon: [name], MD;  Location: [place];  Service: Open Heart Surgery;  Laterality: N/A;  . TRANSCATHETER AORTIC VALVE REPLACEMENT, TRANSFEMORAL Right 10/22/2020   Procedure: TRANSCATHETER AORTIC VALVE REPLACEMENT, TRANSFEMORAL;  Surgeon: [name], MD;  Location[place];  Service: Open Heart Surgery;  Laterality: Right;  . US ECHOCARDIOGRAPHY  01/06/2012   mod. AOV ca+,mild to mod. AS,mod mostly posterior MAC-mild MR    Current Medications: Current Meds  Medication Sig  . acetaminophen (TYLENOL) 500 MG tablet as needed.  Marland Kitchen amoxicillin (AMOXIL) 500 MG tablet Take 4 tablets (2000 mg) one hour prior to all dental visits.  Marland Kitchen apixaban (ELIQUIS) 5 MG TABS tablet Take 1 tablet by mouth daily.  Marland Kitchen aspirin EC 81 MG tablet Take 81 mg by mouth daily. Swallow whole.  Marland Kitchen atorvastatin (LIPITOR) 80 MG tablet Take 80 mg by mouth daily.  Marland Kitchen b complex vitamins capsule Take 1 capsule by mouth daily.  . divalproex (DEPAKOTE ER) 500 MG 24 hr tablet Take 1 tablet (500 mg total) by mouth at bedtime.  . finasteride (PROSCAR) 5 MG tablet Take 5 mg by mouth daily.  . Rivaroxaban (XARELTO) 15 MG TABS tablet Take 1 tablet (15 mg total) by mouth daily with supper.     Allergies:   Flomax [tamsulosin hcl], Levetiracetam, and No known allergies   Social History   Socioeconomic History  . Marital status: Married    Spouse name: Not on file  . Number of children: 2  . Years of education: 6th grade  . Highest education level: Not on file  Occupational History  . Occupation: Retired - lives on 9 acres of land  Tobacco Use  . Smoking status: Former Smoker    Packs/day: 1.00    Years: 10.00    Pack years: 10.00    Types: Cigarettes    Quit date: 10/05/1952    Years since quitting: 68.1  . Smokeless tobacco: Never Used   Vaping Use  . Vaping Use: Never used  Substance and Sexual Activity  . Alcohol use: Not Currently    Comment: occasional beer  . Drug use: Never  . Sexual activity: Never  Other Topics Concern  . Not on file  Social History Narrative   ** Merged History Encounter **       Lives at home with his wife. Left-handed. Caffeine use: 2-3 [employer] per day   Social Determinants of Health   Financial Resource Strain: Not on file  Food Insecurity: Not on file  Transportation Needs: No Transportation Needs  . Lack of Transportation (Medical): No  .  Lack of Transportation (Non-Medical): No  Physical Activity: Not on file  Stress: Not on file  Social Connections: Not on file     Family History: The patient's family history includes Diabetes in his father; Heart failure in his mother; Stroke in his father.  ROS:   Please see the history of present illness.    All other systems reviewed and are negative.  EKGs/Labs/Other Studies Reviewed:    The following studies were reviewed today:   TAVR OPERATIVE NOTE   Date of Procedure:10/22/2020  Preoperative Diagnosis:Severe Aortic Stenosis   Postoperative Diagnosis:Same   Procedure:   Transcatheter Aortic Valve Replacement - PercutaneousRightTransfemoral Approach Edwards Sapien 3 THV (size 34m, model # 9600TFX, serial #[id number]  Co-Surgeons:Christopher MAngelena Form MD andClarence H. ORoxy Manns MD  Anesthesiologist:Stephen TGifford Shave MD  Echocardiographer:Peter Nishan,MD  Pre-operative Echo Findings: ? Severe aortic stenosis ? Normalleft ventricular systolic function  Post-operative Echo Findings: ? Noparavalvular leak ? Normalleft ventricular systolic function  ______________________  Echo 10/23/2020:  IMPRESSIONS  1. Left ventricular ejection fraction, by estimation, is 55%. The left  ventricle has  normal function. The left ventricle has no regional wall  motion abnormalities. There is mild left ventricular hypertrophy. Left  ventricular diastolic parameters are  indeterminate.  2. Right ventricular systolic function is normal. The right ventricular  size is normal.  3. Left atrial size was severely dilated.  4. Right atrial size was moderately dilated.  5. The mitral valve is degenerative. Mild mitral valve regurgitation. No  evidence of mitral stenosis. Severe mitral annular calcification.  6. Post TAVR 10/22/20 with 29 mm Sapien 3 valve mean gradient 12 peak 22  mmHg with aVA 2.4 cm2 trivial PVL seen only on PSSA images at 3:00 . The  aortic valve has been repaired/replaced. Aortic valve regurgitation is  trivial. No aortic stenosis is  present. There is a 29 mm Sapien prosthetic (TAVR) valve present in the  aortic position. Procedure Date: 10/22/2020.  7. Aortic dilatation noted. There is moderate dilatation of the aortic  root, measuring 43 mm.  8. The inferior vena cava is normal in size with greater than 50%  respiratory variability, suggesting right atrial pressure of 3 mmHg.   __________________   Echo 11/14/20 IMPRESSIONS    1. Left ventricular ejection fraction, by estimation, is 60 to 65%. The  left ventricle has normal function. The left ventricle has no regional  wall motion abnormalities. Left ventricular diastolic function could not  be evaluated.  2. Right ventricular systolic function is normal. The right ventricular  size is moderately enlarged.  3. Left atrial size was severely dilated.  4. The mitral valve is degenerative. Mild to moderate mitral valve  regurgitation. No evidence of mitral stenosis. Severe mitral annular  calcification.  5. The aortic valve has been repaired/replaced. Aortic valve  regurgitation is trivial. No aortic stenosis is present. There is a 29 mm  Sapien prosthetic (TAVR) valve present in the aortic position.  Procedure  Date: 10/22/2020. Aortic valve area, by VTI  measures 2.36 cm. Aortic valve mean gradient measures 8.0 mmHg. Aortic  valve Vmax measures 1.79 m/s.  6. Copmared to prior echo, TAVR remains stable with mean AVG 825mg (prior  1224m) and there is a trivial perivalvular leak. Essentially unchanged  echo.   EKG:  EKG is NOT ordered today.    Recent Labs: 10/16/2020: TSH 2.149 10/22/2020: ALT 28 10/23/2020: Hemoglobin 11.4; Magnesium 1.9; Platelets 100 10/30/2020: BUN 28; Creatinine, Ser 1.28; Potassium 4.9; Sodium 141  Recent Lipid Panel  Component Value Date/Time   CHOL 120 10/21/2020 0311   TRIG 53 10/21/2020 0311   HDL 62 10/21/2020 0311   CHOLHDL 1.9 10/21/2020 0311   VLDL 11 10/21/2020 0311   LDLCALC 47 10/21/2020 0311     Risk Assessment/Calculations:    CHA2DS2-VASc Score = 3  This indicates a 3.2% annual risk of stroke. The patient's score is based upon: CHF History: No HTN History: Yes Diabetes History: No Stroke History: No Vascular Disease History: No Age Score: 2 Gender Score: 0    Physical Exam:    VS:  BP 128/80   Pulse 77   Ht _0  (1.854 m)   Wt 197 lb 3.2 oz (89.4 kg)   SpO2 99%   BMI 26.02 kg/m     Wt Readings from Last 3 Encounters:  11/14/20 197 lb 3.2 oz (89.4 kg)  10/30/20 191 lb (86.6 kg)  10/23/20 183 lb 3.2 oz (83.1 kg)     GEN:  Well nourished, well developed in no acute distress HEENT: Normal NECK: No JVD; No carotid bruits LYMPHATICS: No lymphadenopathy CARDIAC: irreg irreg. no murmurs, rubs, gallops RESPIRATORY:  Clear to auscultation without rales, wheezing or rhonchi  ABDOMEN: Soft, non-tender, non-distended MUSCULOSKELETAL:  No edema; No deformity  SKIN: Warm and dry.   NEUROLOGIC:  Alert and oriented x 3 PSYCHIATRIC:  Normal affect   ASSESSMENT:    1. S/P TAVR (transcatheter aortic valve replacement)   2. Permanent atrial fibrillation ([place])   3. Coronary artery disease involving native heart without angina  pectoris, unspecified vessel or lesion type   4. Stage 3 chronic kidney disease, unspecified whether stage 3a or 3b CKD ([place])   5. Thoracoabdominal aortic aneurysm (TAAA) without rupture ([place])   6. Pancreatic lesion    PLAN:    In order of problems listed above:  Severe AS s/p TAVR: echo today shows EF 60%, normally functioning TAVR with a mean gradient of 8 mm hg and trivial PVL. He has NYHA class I symptoms. SBE prophylaxis discussed; I have RX'd amoxicillin. Continue on Eliquis and Aspirin. He can stop aspirin after 6 months (04/21/21). I will see him back in 1 year with an echo  Chronic atrial fibrillation: rate well controlled. Recently switched from Eliquis to Xarelto 69m daily due to insurance coverage. It cost them >$600. [facility]ped [facility] out patient assistance forms for them today. They do not want to go on Coumadin   Non-obstructive CIPJ:ASNKNLcath showed 45% LM, 20% pLAD, and 40% pRCA stenosis. Continue medical therapy with aspirin and statin.   CKD stage 3:creat back to baseline.   TAA: 46m Followed by Dr. BaCyndia Bent  Pancreatic lesion: pre TAVR CT showed a cystic 2.6 cm pancreatic body lesion with associated new pancreatic tail atrophy and duct dilation, increased since 08/05/2019 CT. 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 MRI abdomen without and with IV contrast is indicated for further characterization. This was discussed with the pt and family and they do not want to pursue further imaging at this time, which I think is reasonable.    Medication Adjustments/Labs and Tests Ordered: Current medicines are reviewed at length with the patient today.  Concerns regarding medicines are outlined above.  No orders of the defined types were placed in this encounter.  No orders of the defined types were placed in this encounter.   Patient Instructions  Medication Instructions:  1) You may STOP ASPIRIN 04/21/21 *If you need a refill on your cardiac  medications before your next appointment, please call your pharmacy*  Follow-Up: Keep your appointment with Dr. [name] as scheduled.    Signed, Angelena Form, PA-C  11/14/2020 3:06 PM    [facility]

## 2020-11-14 NOTE — Patient Instructions (Signed)
Medication Instructions:  1) You may STOP ASPIRIN 04/21/21 *If you need a refill on your cardiac medications before your next appointment, please call your pharmacy*  Follow-Up: Keep your appointment with Dr. [name] as scheduled.

## 2020-11-15 ENCOUNTER — Telehealth: Payer: Self-pay | Admitting: Pharmacist

## 2020-11-15 NOTE — Telephone Encounter (Signed)
Called and spoke with patient wife who provided me with the pt SSN and their annual income. I also spoke with pt son with permission from wife. Ive asked him to send me a copy of pt insurance card ([employer]). He will take a picture and email to me.  I have faxed application to [employer]. Will send insurance card when I receive it.

## 2020-11-18 NOTE — Telephone Encounter (Signed)
Patient assistance paperwork re faxed to include copy of insurance card

## 2020-11-19 ENCOUNTER — Other Ambulatory Visit: Payer: Self-pay

## 2020-11-19 ENCOUNTER — Emergency Department (HOSPITAL_COMMUNITY): Payer: Medicare Other

## 2020-11-19 ENCOUNTER — Observation Stay (HOSPITAL_COMMUNITY): Payer: Medicare Other

## 2020-11-19 ENCOUNTER — Other Ambulatory Visit (HOSPITAL_COMMUNITY): Payer: Self-pay

## 2020-11-19 ENCOUNTER — Inpatient Hospital Stay (HOSPITAL_COMMUNITY)
Admission: EM | Admit: 2020-11-19 | Discharge: 2020-12-02 | DRG: 026 | Disposition: A | Discharge status: Rehabilitation Facility | Payer: Medicare Other | Attending: Neurosurgery | Admitting: Neurosurgery

## 2020-11-19 DIAGNOSIS — Z952 Presence of prosthetic heart valve: Secondary | ICD-10-CM

## 2020-11-19 DIAGNOSIS — G4733 Obstructive sleep apnea (adult) (pediatric): Secondary | ICD-10-CM

## 2020-11-19 DIAGNOSIS — I4821 Permanent atrial fibrillation: Secondary | ICD-10-CM | POA: Diagnosis present

## 2020-11-19 DIAGNOSIS — N390 Urinary tract infection, site not specified: Secondary | ICD-10-CM | POA: Diagnosis not present

## 2020-11-19 DIAGNOSIS — N183 Chronic kidney disease, stage 3 unspecified: Secondary | ICD-10-CM | POA: Diagnosis present

## 2020-11-19 DIAGNOSIS — D696 Thrombocytopenia, unspecified: Secondary | ICD-10-CM

## 2020-11-19 DIAGNOSIS — R531 Weakness: Secondary | ICD-10-CM | POA: Diagnosis not present

## 2020-11-19 DIAGNOSIS — I4891 Unspecified atrial fibrillation: Secondary | ICD-10-CM | POA: Diagnosis present

## 2020-11-19 DIAGNOSIS — I129 Hypertensive chronic kidney disease with stage 1 through stage 4 chronic kidney disease, or unspecified chronic kidney disease: Secondary | ICD-10-CM | POA: Diagnosis present

## 2020-11-19 DIAGNOSIS — R1312 Dysphagia, oropharyngeal phase: Secondary | ICD-10-CM | POA: Diagnosis present

## 2020-11-19 DIAGNOSIS — Z888 Allergy status to other drugs, medicaments and biological substances status: Secondary | ICD-10-CM

## 2020-11-19 DIAGNOSIS — Z87891 Personal history of nicotine dependence: Secondary | ICD-10-CM

## 2020-11-19 DIAGNOSIS — G8191 Hemiplegia, unspecified affecting right dominant side: Secondary | ICD-10-CM | POA: Diagnosis not present

## 2020-11-19 DIAGNOSIS — Z48812 Encounter for surgical aftercare following surgery on the circulatory system: Secondary | ICD-10-CM | POA: Diagnosis not present

## 2020-11-19 DIAGNOSIS — R296 Repeated falls: Secondary | ICD-10-CM | POA: Diagnosis not present

## 2020-11-19 DIAGNOSIS — H9190 Unspecified hearing loss, unspecified ear: Secondary | ICD-10-CM | POA: Diagnosis present

## 2020-11-19 DIAGNOSIS — R29810 Facial weakness: Secondary | ICD-10-CM | POA: Diagnosis present

## 2020-11-19 DIAGNOSIS — I1310 Hypertensive heart and chronic kidney disease without heart failure, with stage 1 through stage 4 chronic kidney disease, or unspecified chronic kidney disease: Secondary | ICD-10-CM | POA: Diagnosis not present

## 2020-11-19 DIAGNOSIS — G40909 Epilepsy, unspecified, not intractable, without status epilepticus: Secondary | ICD-10-CM | POA: Diagnosis present

## 2020-11-19 DIAGNOSIS — Z79899 Other long term (current) drug therapy: Secondary | ICD-10-CM

## 2020-11-19 DIAGNOSIS — R410 Disorientation, unspecified: Secondary | ICD-10-CM | POA: Diagnosis not present

## 2020-11-19 DIAGNOSIS — Z823 Family history of stroke: Secondary | ICD-10-CM

## 2020-11-19 DIAGNOSIS — S065X9A Traumatic subdural hemorrhage with loss of consciousness of unspecified duration, initial encounter: Secondary | ICD-10-CM

## 2020-11-19 DIAGNOSIS — R569 Unspecified convulsions: Secondary | ICD-10-CM

## 2020-11-19 DIAGNOSIS — I6201 Nontraumatic acute subdural hemorrhage: Secondary | ICD-10-CM | POA: Diagnosis not present

## 2020-11-19 DIAGNOSIS — R471 Dysarthria and anarthria: Secondary | ICD-10-CM | POA: Diagnosis not present

## 2020-11-19 DIAGNOSIS — R0902 Hypoxemia: Secondary | ICD-10-CM | POA: Diagnosis not present

## 2020-11-19 DIAGNOSIS — I6203 Nontraumatic chronic subdural hemorrhage: Secondary | ICD-10-CM | POA: Diagnosis present

## 2020-11-19 DIAGNOSIS — R319 Hematuria, unspecified: Secondary | ICD-10-CM | POA: Diagnosis present

## 2020-11-19 DIAGNOSIS — W19XXXA Unspecified fall, initial encounter: Secondary | ICD-10-CM

## 2020-11-19 DIAGNOSIS — Z8249 Family history of ischemic heart disease and other diseases of the circulatory system: Secondary | ICD-10-CM

## 2020-11-19 DIAGNOSIS — G9349 Other encephalopathy: Secondary | ICD-10-CM | POA: Diagnosis not present

## 2020-11-19 DIAGNOSIS — J690 Pneumonitis due to inhalation of food and vomit: Secondary | ICD-10-CM

## 2020-11-19 DIAGNOSIS — E785 Hyperlipidemia, unspecified: Secondary | ICD-10-CM | POA: Diagnosis present

## 2020-11-19 DIAGNOSIS — S065X0A Traumatic subdural hemorrhage without loss of consciousness, initial encounter: Secondary | ICD-10-CM | POA: Diagnosis not present

## 2020-11-19 DIAGNOSIS — E7800 Pure hypercholesterolemia, unspecified: Secondary | ICD-10-CM | POA: Diagnosis present

## 2020-11-19 DIAGNOSIS — N401 Enlarged prostate with lower urinary tract symptoms: Secondary | ICD-10-CM | POA: Diagnosis present

## 2020-11-19 DIAGNOSIS — I700 Atherosclerosis of aorta: Secondary | ICD-10-CM | POA: Diagnosis not present

## 2020-11-19 DIAGNOSIS — Z20822 Contact with and (suspected) exposure to covid-19: Secondary | ICD-10-CM | POA: Diagnosis present

## 2020-11-19 DIAGNOSIS — R4701 Aphasia: Secondary | ICD-10-CM | POA: Diagnosis present

## 2020-11-19 DIAGNOSIS — I6200 Nontraumatic subdural hemorrhage, unspecified: Secondary | ICD-10-CM | POA: Diagnosis present

## 2020-11-19 DIAGNOSIS — I712 Thoracic aortic aneurysm, without rupture: Secondary | ICD-10-CM | POA: Diagnosis present

## 2020-11-19 DIAGNOSIS — Z7901 Long term (current) use of anticoagulants: Secondary | ICD-10-CM

## 2020-11-19 DIAGNOSIS — Z833 Family history of diabetes mellitus: Secondary | ICD-10-CM

## 2020-11-19 DIAGNOSIS — I080 Rheumatic disorders of both mitral and aortic valves: Secondary | ICD-10-CM | POA: Diagnosis not present

## 2020-11-19 DIAGNOSIS — Z7982 Long term (current) use of aspirin: Secondary | ICD-10-CM

## 2020-11-19 DIAGNOSIS — I2510 Atherosclerotic heart disease of native coronary artery without angina pectoris: Secondary | ICD-10-CM | POA: Diagnosis not present

## 2020-11-19 DIAGNOSIS — R3915 Urgency of urination: Secondary | ICD-10-CM | POA: Diagnosis present

## 2020-11-19 DIAGNOSIS — E872 Acidosis: Secondary | ICD-10-CM | POA: Diagnosis not present

## 2020-11-19 DIAGNOSIS — N1831 Chronic kidney disease, stage 3a: Secondary | ICD-10-CM | POA: Diagnosis present

## 2020-11-19 DIAGNOSIS — Z86718 Personal history of other venous thrombosis and embolism: Secondary | ICD-10-CM

## 2020-11-19 DIAGNOSIS — R404 Transient alteration of awareness: Secondary | ICD-10-CM | POA: Diagnosis not present

## 2020-11-19 DIAGNOSIS — S065XAA Traumatic subdural hemorrhage with loss of consciousness status unknown, initial encounter: Secondary | ICD-10-CM | POA: Diagnosis present

## 2020-11-19 DIAGNOSIS — Z8673 Personal history of transient ischemic attack (TIA), and cerebral infarction without residual deficits: Secondary | ICD-10-CM

## 2020-11-19 LAB — PROTIME-INR
INR: 1.3 — ABNORMAL HIGH (ref 0.8–1.2)
Prothrombin Time: 15.7 seconds — ABNORMAL HIGH (ref 11.4–15.2)

## 2020-11-19 LAB — TYPE AND SCREEN
ABO/RH(D): A POS
Antibody Screen: NEGATIVE

## 2020-11-19 LAB — COMPREHENSIVE METABOLIC PANEL
ALT: 22 U/L (ref 0–44)
AST: 28 U/L (ref 15–41)
Albumin: 3.1 g/dL — ABNORMAL LOW (ref 3.5–5.0)
Alkaline Phosphatase: 59 U/L (ref 38–126)
Anion gap: 8 (ref 5–15)
BUN: 20 mg/dL (ref 8–23)
CO2: 25 mmol/L (ref 22–32)
Calcium: 8.9 mg/dL (ref 8.9–10.3)
Chloride: 104 mmol/L (ref 98–111)
Creatinine, Ser: 1.34 mg/dL — ABNORMAL HIGH (ref 0.61–1.24)
GFR, Estimated: 51 mL/min — ABNORMAL LOW (ref >60)
Glucose, Bld: 123 mg/dL — ABNORMAL HIGH (ref 70–99)
Potassium: 4.7 mmol/L (ref 3.5–5.1)
Sodium: 137 mmol/L (ref 135–145)
Total Bilirubin: 0.9 mg/dL (ref 0.3–1.2)
Total Protein: 6.6 g/dL (ref 6.5–8.1)

## 2020-11-19 LAB — DIFFERENTIAL
Abs Immature Granulocytes: 0.02 K/uL (ref 0.00–0.07)
Basophils Absolute: 0 K/uL (ref 0.0–0.1)
Basophils Relative: 1 %
Eosinophils Absolute: 0.3 K/uL (ref 0.0–0.5)
Eosinophils Relative: 4 %
Immature Granulocytes: 0 %
Lymphocytes Relative: 11 %
Lymphs Abs: 0.7 K/uL (ref 0.7–4.0)
Monocytes Absolute: 0.6 K/uL (ref 0.1–1.0)
Monocytes Relative: 9 %
Neutro Abs: 5.1 K/uL (ref 1.7–7.7)
Neutrophils Relative %: 75 %

## 2020-11-19 LAB — I-STAT CHEM 8, ED
BUN: 24 mg/dL — ABNORMAL HIGH (ref 8–23)
Calcium, Ion: 1.23 mmol/L (ref 1.15–1.40)
Chloride: 104 mmol/L (ref 98–111)
Creatinine, Ser: 1.4 mg/dL — ABNORMAL HIGH (ref 0.61–1.24)
Glucose, Bld: 119 mg/dL — ABNORMAL HIGH (ref 70–99)
HCT: 36 % — ABNORMAL LOW (ref 39.0–52.0)
Hemoglobin: 12.2 g/dL — ABNORMAL LOW (ref 13.0–17.0)
Potassium: 4.8 mmol/L (ref 3.5–5.1)
Sodium: 139 mmol/L (ref 135–145)
TCO2: 26 mmol/L (ref 22–32)

## 2020-11-19 LAB — CBC
HCT: 36.9 % — ABNORMAL LOW (ref 39.0–52.0)
Hemoglobin: 12.3 g/dL — ABNORMAL LOW (ref 13.0–17.0)
MCH: 32.5 pg (ref 26.0–34.0)
MCHC: 33.3 g/dL (ref 30.0–36.0)
MCV: 97.6 fL (ref 80.0–100.0)
Platelets: 114 10*3/uL — ABNORMAL LOW (ref 150–400)
RBC: 3.78 MIL/uL — ABNORMAL LOW (ref 4.22–5.81)
RDW: 13.2 % (ref 11.5–15.5)
WBC: 6.8 10*3/uL (ref 4.0–10.5)
nRBC: 0 % (ref 0.0–0.2)

## 2020-11-19 LAB — VALPROIC ACID LEVEL: Valproic Acid Lvl: 26 ug/mL — ABNORMAL LOW (ref 50.0–100.0)

## 2020-11-19 LAB — APTT: aPTT: 36 seconds (ref 24–36)

## 2020-11-19 LAB — VITAMIN B12: Vitamin B-12: 869 pg/mL (ref 180–914)

## 2020-11-19 MED ORDER — SODIUM CHLORIDE 0.9% FLUSH
3.0000 mL | Freq: Once | INTRAVENOUS | Status: AC
Start: 2020-11-19 — End: 2020-11-20
  Administered 2020-11-20: 3 mL via INTRAVENOUS

## 2020-11-19 MED ORDER — ACETAMINOPHEN 325 MG PO TABS
650.0000 mg | ORAL_TABLET | Freq: Four times a day (QID) | ORAL | Status: DC | PRN
  Administered 2020-11-25: 650 mg via ORAL
  Filled 2020-11-19: qty 2

## 2020-11-19 MED ORDER — ACETAMINOPHEN 650 MG RE SUPP: 650.0000 mg | Freq: Four times a day (QID) | RECTAL | Status: DC | PRN

## 2020-11-19 NOTE — H&P (Incomplete)
History and Physical    Cesar [name] YSA:[id number] DOB: Feb 24, 1929 DOA: 11/19/2020  PCP: Orpah Melter, MD Patient coming from: Home  Chief Complaint: Weakness, multiple falls  HPI: Cesar [name] is a 87 y.o. male with medical history significant of permanent A. fib on Eliquis, CKD stage III, CVA, seizure disorder, depression, history of DVT, hyperlipidemia, OSA not on CPAP, AAA, ascending thoracic aortic aneurysm followed by Dr. [name], severe aortic stenosis status post TAVR 10/22/2020, nonobstructive CAD presenting to the ED via EMS for evaluation of weakness and multiple falls.  Patient lives with his son and reportedly has had 3 falls in the last 24 hours and hit his head.  He has been very weak recently.  At triage, it was noted that patient was having difficulty moving his right leg. Patient very hard of hearing and history limited. Reports having problems with his balance for the past few months and reports falling twice yesterday. Denies loss of consciousness. Denies injuring his head. Denies any dizziness, chest pain, shortness of breath, or palpitations. No additional history could be obtained from him.  ED Course: On arrival, slightly tachycardic but remainder of vital signs stable.  WBC 6.8, hemoglobin 12.3 (at baseline), platelet count 114K (at baseline).  Sodium 137, potassium 4.7, chloride 104, bicarb 25, BUN 20, creatinine 1.3, glucose 123.  INR 1.3.  B12 level normal.  Valproic acid level subtherapeutic.  SARS-CoV-2 PCR test pending.  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  There is associated mass-effect with 5 mm left to right midline shift.  CT C-spine negative for acute osseous abnormality.  ED physician discussed the case with neurosurgery who recommended no anticoagulation reversal at this time.  Recommended holding Eliquis and repeating head CT in the morning.  Neurosurgery will  consult.  Review of Systems:  All systems reviewed and apart from history of presenting illness, are negative.  Past Medical History:  Diagnosis Date  . AAA (abdominal aortic aneurysm) ([place])   . CKD (chronic kidney disease), stage III (HCC)    baseline Cr 1.4-1.5 in 2016-2017 by PCP)  . CVA (cerebral infarction)   . Depression   . DVT (deep venous thrombosis) ([place]) 2006  . H/O blood clots   . Hematuria   . HOH (hard of hearing)   . Hypercholesterolemia   . OSA (obstructive sleep apnea)    intol to CPAP  . Permanent atrial fibrillation ([place])   . Prostate enlargement   . Pulmonary nodules   . 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 Buena Irish and Dr. Roxy Manns  . Thoracic ascending aortic aneurysm ([place])    4.5cm in 11/2018, stable since 2016  . TIA (transient ischemic attack)     Past Surgical History:  Procedure Laterality Date  . EMBOLECTOMY Left 07/02/2016   Procedure: EMBOLECTOMY LEFT FEMORAL ARTERY;  Surgeon: [name], MD;  Location: [facility];  Service: Vascular;  Laterality: Left;  Marland Kitchen MESENTERIC ARTERY BYPASS N/A 08/06/2019   Procedure: SUPERIOR MESENTERIC ARTERY THROMBECTOMY;  Surgeon: [name], MD;  Location: [facility];  Service: Vascular;  Laterality: N/A;  . NM [place]  08/24/2002   markedly positive,subtle lateral ischemia  . PATCH ANGIOPLASTY  08/06/2019   Procedure: Patch Angioplasty of superior messenteric artery with RIGHT femoral vein;  Surgeon: [name], MD;  Location[facility]  OR;  Service: Vascular;;  . PROSTATE SURGERY    . RIGHT HEART CATH AND CORONARY ANGIOGRAPHY N/A 10/21/2020   Procedure: RIGHT HEART CATH AND CORONARY ANGIOGRAPHY;  Surgeon: [name], MD;  Location: [facility];  Service: Cardiovascular;  Laterality: N/A;  . TEE WITHOUT CARDIOVERSION N/A 10/22/2020   Procedure: TRANSESOPHAGEAL ECHOCARDIOGRAM (TEE);  Surgeon: [name], MD;  Location: [place];  Service: Open Heart Surgery;  Laterality: N/A;  . TRANSCATHETER AORTIC VALVE REPLACEMENT, TRANSFEMORAL Right 10/22/2020   Procedure: TRANSCATHETER AORTIC VALVE REPLACEMENT, TRANSFEMORAL;  Surgeon: [name], MD;  Location: [place];  Service: Open Heart Surgery;  Laterality: Right;  . US ECHOCARDIOGRAPHY  01/06/2012   mod. AOV ca+,mild to mod. AS,mod mostly posterior MAC-mild MR     reports that he quit smoking about 68 years ago. His smoking use included cigarettes. He has a 10.00 pack-year smoking history. He has never used smokeless tobacco. He reports previous alcohol use. He reports that he does not use drugs.  Allergies  Allergen Reactions  . Flomax [Tamsulosin Hcl] Other (See Comments)    Can't pee  . Levetiracetam Other (See Comments)  . No Known Allergies Other (See Comments)    Family History  Problem Relation Age of Onset  . Heart failure Mother   . Diabetes Father   . Stroke Father     Prior to Admission medications   Medication Sig Start Date End Date Taking? Authorizing Provider  acetaminophen (TYLENOL) 500 MG tablet as needed.    [provider]  amoxicillin (AMOXIL) 500 MG tablet Take 4 tablets (2000 mg) one hour prior to all dental visits. 10/30/20   Eileen Stanford, PA-C  apixaban (ELIQUIS) 5 MG TABS tablet Take 1 tablet by mouth daily. 07/24/20   [provider]  aspirin EC 81 MG tablet Take 81 mg by mouth daily. Swallow whole.    [provider]  atorvastatin (LIPITOR) 80 MG tablet Take 80 mg by mouth daily.    [provider]  b complex vitamins capsule Take 1 capsule by mouth daily.    [provider]  divalproex (DEPAKOTE ER) 500 MG 24 hr tablet Take 1 tablet (500 mg total) by mouth at bedtime. 08/13/20   Suzzanne Cloud, NP  finasteride (PROSCAR) 5 MG tablet Take 5 mg by mouth daily.    [provider]  Rivaroxaban (XARELTO) 15 MG TABS tablet Take 1 tablet (15  mg total) by mouth daily with supper. 11/01/20   Croitoru, Dani Gobble, MD    Physical Exam: Vitals:   11/19/20 2015 11/19/20 2030 11/19/20 2045 11/19/20 2100  BP: (!) 166/93 (!) 151/72    Pulse: 75 (!) 50 85 74  Resp: (!) 25 (!) 38 (!) 27 (!) 31  Temp:      TempSrc:      SpO2: 97% 99% 100% 100%    Physical Exam Constitutional:      General: He is not in acute distress. HENT:     Head: Normocephalic.  Eyes:     Comments: Pupillary reaction to light sluggish on the right  Cardiovascular:     Rate and Rhythm: Normal rate and regular rhythm.     Pulses: Normal pulses.  Pulmonary:     Effort: Pulmonary effort is normal. No respiratory distress.     Breath sounds: Normal breath sounds. No wheezing or rales.  Abdominal:     General: Bowel sounds are normal. There is no distension.  Palpations: Abdomen is soft.     Tenderness: There is no abdominal tenderness.  Musculoskeletal:        General: No swelling or tenderness.     Cervical back: Normal range of motion and neck supple.  Skin:    General: Skin is warm and dry.  Neurological:     Mental Status: He is alert.     Comments: Oriented to person and place only Strength intact in bilateral upper extremities. He is able to raise bilateral lower extremities up in the bed against gravity but right leg is noted to be slightly weaker. Sensation to light touch intact throughout.     Labs on Admission: I have personally reviewed following labs and imaging studies  CBC: Recent Labs  Lab 11/19/20 1751 11/19/20 1818  WBC 6.8  --   NEUTROABS 5.1  --   HGB 12.3* 12.2*  HCT 36.9* 36.0*  MCV 97.6  --   PLT 114*  --    Basic Metabolic Panel: Recent Labs  Lab 11/19/20 1751 11/19/20 1818  NA 137 139  K 4.7 4.8  CL 104 104  CO2 25  --   GLUCOSE 123* 119*  BUN 20 24*  CREATININE 1.34* 1.40*  CALCIUM 8.9  --    GFR: Estimated Creatinine Clearance: 40.4 mL/min (A) (by C-G formula based on SCr of 1.4 mg/dL (H)). Liver  Function Tests: Recent Labs  Lab 11/19/20 1751  AST 28  ALT 22  ALKPHOS 59  BILITOT 0.9  PROT 6.6  ALBUMIN 3.1*   No results for input(s): LIPASE, AMYLASE in the last 168 hours. No results for input(s): AMMONIA in the last 168 hours. Coagulation Profile: Recent Labs  Lab 11/19/20 1751  INR 1.3*   Cardiac Enzymes: No results for input(s): CKTOTAL, CKMB, CKMBINDEX, TROPONINI in the last 168 hours. BNP (last 3 results) No results for input(s): PROBNP in the last 8760 hours. HbA1C: No results for input(s): HGBA1C in the last 72 hours. CBG: No results for input(s): GLUCAP in the last 168 hours. Lipid Profile: No results for input(s): CHOL, HDL, LDLCALC, TRIG, CHOLHDL, LDLDIRECT in the last 72 hours. Thyroid Function Tests: No results for input(s): TSH, T4TOTAL, FREET4, T3FREE, THYROIDAB in the last 72 hours. Anemia Panel: Recent Labs    11/19/20 2013  VITAMINB12 869   Urine analysis:    Component Value Date/Time   COLORURINE YELLOW 10/16/2020 1000   APPEARANCEUR CLEAR 10/16/2020 1000   LABSPEC 1.013 10/16/2020 1000   PHURINE 7.0 10/16/2020 1000   GLUCOSEU NEGATIVE 10/16/2020 1000   HGBUR NEGATIVE 10/16/2020 1000   BILIRUBINUR NEGATIVE 10/16/2020 1000   KETONESUR NEGATIVE 10/16/2020 1000   PROTEINUR NEGATIVE 10/16/2020 1000   UROBILINOGEN 2.0 (H) 01/01/2012 1328   NITRITE NEGATIVE 10/16/2020 1000   LEUKOCYTESUR TRACE (A) 10/16/2020 1000    Radiological Exams on Admission: CT HEAD WO CONTRAST  Result Date: 11/19/2020 CLINICAL DATA:  Weakness and confusion. EXAM: CT HEAD WITHOUT CONTRAST TECHNIQUE: Contiguous axial images were obtained from the base of the skull through the vertex without intravenous contrast. COMPARISON:  CT head dated 03/15/2019. FINDINGS: Brain: A mixed density subdural fluid collection overlying the left cerebral convexity measures up to 3.0 cm in greatest depth and consistent with an acute on chronic subdural hematoma. There is associated mass  effect with 5 mm left to right midline shift. The basilar cisterns are patent. There is no hydrocephalus. Periventricular white matter hypoattenuation likely represents chronic small vessel ischemic disease. Vascular: There are vascular calcifications in  the carotid siphons. Skull: Normal. Negative for fracture or focal lesion. Sinuses/Orbits: Left maxillary sinus disease versus mucous retention cysts are noted. Other: None. 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 There is associated mass effect with 5 mm left to right midline shift. These results were called by telephone at the time of interpretation on 11/19/2020 at 7:09 pm to provider [facility] , who verbally acknowledged these results. Electronically Signed   By: Zerita Boers M.D.   On: 11/19/2020 19:13   CT Cervical Spine Wo Contrast  Result Date: 11/19/2020 CLINICAL DATA:  Multiple fall EXAM: CT CERVICAL SPINE WITHOUT CONTRAST TECHNIQUE: Multidetector CT imaging of the cervical spine was performed without intravenous contrast. Multiplanar CT image reconstructions were also generated. COMPARISON:  None. FINDINGS: Alignment: No subluxation.  Facet alignment is maintained. Skull base and vertebrae: No acute fracture. No primary bone lesion or focal pathologic process. Soft tissues and spinal canal: No prevertebral fluid or swelling. No visible canal hematoma. Disc levels: Multiple level degenerative change with moderate disc space narrowing at C3-C4, C5-C6 and C6-C7. Facet degenerative changes at multiple levels. Upper chest: Emphysema at the apices Other: None IMPRESSION: 1. Degenerative changes of the cervical spine. No acute osseous abnormality. 2. Emphysema at the apices. Emphysema (ICD10-J43.9). Electronically Signed   By: Donavan Foil M.D.   On: 11/19/2020 21:01    EKG: Independently reviewed.  Atrial fibrillation, no significant change since prior  tracing.  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 fib and severe aortic stenosis status post TAVR on 10/22/2020.  He is also on aspirin 81 mg daily. Reportedly fell 3 times in the past 24 hours. 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  There is associated mass-effect with 5 mm left to right midline shift. ED physician discussed the case with neurosurgery who recommended no anticoagulation reversal at this time.  Recommended holding Eliquis and repeating head CT in the morning.  Neurosurgery will consult. At the time of my evaluation, patient noted to have mild weakness of his right lower extremity and right eye sluggish pupillary response to light. I spoke to on-call neurosurgeon who again recommends holding off giving anticoagulation reversal. Recommends repeating head CT in the morning. Recommending starting Keppra for seizure prophylaxis. -Hold Eliquis and aspirin.  Frequent neurochecks. Repeat head CT scheduled for 6 AM in the morning. Keppra 500 mg twice daily started.  Generalized weakness, frequent falls -SARS-CoV-2 PCR test pending.  Chest x-ray and UA ordered to assess for possible infectious source. PT/OT eval, fall precautions  Permanent A. Fib  CKD stage III: Stable.  Renal function at baseline.  CVA  Seizure disorder -Valproic acid level subtherapeutic.  Depression  Hyperlipidemia  Pharmacy med rec pending.  DVT prophylaxis: SCDs Code Status: *** Family Communication: ***  Disposition Plan: Status is: Observation  {Observation:23811}  Dispo: The patient is from: {From:23814}              Anticipated d/c is to: {To:23815}              Anticipated d/c date is: {Days:23816}              Patient currently {Medically stable:23817}   Difficult to place patient  {Yes/No:25151}       Consults called: *** Admission  status: *** Level of care: Level of care: Progressive The medical decision making on this patient was of high complexity and the patient is at high risk for clinical deterioration, therefore this is a level 3 visit.***  The medical decision making is of moderate complexity, therefore this is a level 2 visit.***  Shela Leff MD [facility]  If 7PM-7AM, please contact night-coverage [email or web address]  11/19/2020, 10:15 PM

## 2020-11-19 NOTE — H&P (Addendum)
History and Physical    DESHAWN WITTY YSA:[id number] DOB: Feb 24, 1929 DOA: 11/19/2020  PCP: Orpah Melter, MD Patient coming from: Home  Chief Complaint: Weakness, multiple falls  HPI: KAULANA BRINDLE is a 87 y.o. male with medical history significant of permanent A. fib on Eliquis, CKD stage III, CVA, seizure disorder, depression, history of DVT, hyperlipidemia, OSA not on CPAP, AAA, ascending thoracic aortic aneurysm followed by Dr. [name], severe aortic stenosis status post TAVR 10/22/2020, nonobstructive CAD presenting to the ED via EMS for evaluation of weakness and multiple falls.  Patient lives with his son and reportedly has had 3 falls in the last 24 hours and hit his head.  He has been very weak recently.  At triage, it was noted that patient was having difficulty moving his right leg. Patient very hard of hearing and history limited. Reports having problems with his balance for the past few months and reports falling twice yesterday. Denies loss of consciousness. Denies injuring his head. Denies any dizziness, chest pain, shortness of breath, or palpitations. No additional history could be obtained from him.  ED Course: On arrival, slightly tachycardic but remainder of vital signs stable.  WBC 6.8, hemoglobin 12.3 (at baseline), platelet count 114K (at baseline).  Sodium 137, potassium 4.7, chloride 104, bicarb 25, BUN 20, creatinine 1.3, glucose 123.  INR 1.3.  B12 level normal.  Valproic acid level subtherapeutic.  SARS-CoV-2 PCR test pending.  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  There is associated mass-effect with 5 mm left to right midline shift.  CT C-spine negative for acute osseous abnormality.  ED physician discussed the case with neurosurgery who recommended no anticoagulation reversal at this time.  Recommended holding Eliquis and repeating head CT in the morning.  Neurosurgery will  consult.  Review of Systems:  All systems reviewed and apart from history of presenting illness, are negative.  Past Medical History:  Diagnosis Date  . AAA (abdominal aortic aneurysm) ([place])   . CKD (chronic kidney disease), stage III (HCC)    baseline Cr 1.4-1.5 in 2016-2017 by PCP)  . CVA (cerebral infarction)   . Depression   . DVT (deep venous thrombosis) ([place]) 2006  . H/O blood clots   . Hematuria   . HOH (hard of hearing)   . Hypercholesterolemia   . OSA (obstructive sleep apnea)    intol to CPAP  . Permanent atrial fibrillation ([place])   . Prostate enlargement   . Pulmonary nodules   . 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 Buena Irish and Dr. Roxy Manns  . Thoracic ascending aortic aneurysm ([place])    4.5cm in 11/2018, stable since 2016  . TIA (transient ischemic attack)     Past Surgical History:  Procedure Laterality Date  . EMBOLECTOMY Left 07/02/2016   Procedure: EMBOLECTOMY LEFT FEMORAL ARTERY;  Surgeon: [name], MD;  Location: [facility];  Service: Vascular;  Laterality: Left;  Marland Kitchen MESENTERIC ARTERY BYPASS N/A 08/06/2019   Procedure: SUPERIOR MESENTERIC ARTERY THROMBECTOMY;  Surgeon: [name], MD;  Location: [facility];  Service: Vascular;  Laterality: N/A;  . NM [place]  08/24/2002   markedly positive,subtle lateral ischemia  . PATCH ANGIOPLASTY  08/06/2019   Procedure: Patch Angioplasty of superior messenteric artery with RIGHT femoral vein;  Surgeon: [name], MD;  Location[facility]  OR;  Service: Vascular;;  . PROSTATE SURGERY    . RIGHT HEART CATH AND CORONARY ANGIOGRAPHY N/A 10/21/2020   Procedure: RIGHT HEART CATH AND CORONARY ANGIOGRAPHY;  Surgeon: [name], MD;  Location: [facility];  Service: Cardiovascular;  Laterality: N/A;  . TEE WITHOUT CARDIOVERSION N/A 10/22/2020   Procedure: TRANSESOPHAGEAL ECHOCARDIOGRAM (TEE);  Surgeon: [name], MD;  Location: [place];  Service: Open Heart Surgery;  Laterality: N/A;  . TRANSCATHETER AORTIC VALVE REPLACEMENT, TRANSFEMORAL Right 10/22/2020   Procedure: TRANSCATHETER AORTIC VALVE REPLACEMENT, TRANSFEMORAL;  Surgeon: [name], MD;  Location: [facility];  Service: Open Heart Surgery;  Laterality: Right;  . US ECHOCARDIOGRAPHY  01/06/2012   mod. AOV ca+,mild to mod. AS,mod mostly posterior MAC-mild MR     reports that he quit smoking about 68 years ago. His smoking use included cigarettes. He has a 10.00 pack-year smoking history. He has never used smokeless tobacco. He reports previous alcohol use. He reports that he does not use drugs.  Allergies  Allergen Reactions  . Flomax [Tamsulosin Hcl] Other (See Comments)    Can't pee  . Levetiracetam Other (See Comments)  . No Known Allergies Other (See Comments)    Family History  Problem Relation Age of Onset  . Heart failure Mother   . Diabetes Father   . Stroke Father     Prior to Admission medications   Medication Sig Start Date End Date Taking? Authorizing Provider  acetaminophen (TYLENOL) 500 MG tablet as needed.    [provider]  amoxicillin (AMOXIL) 500 MG tablet Take 4 tablets (2000 mg) one hour prior to all dental visits. 10/30/20   Eileen Stanford, PA-C  apixaban (ELIQUIS) 5 MG TABS tablet Take 1 tablet by mouth daily. 07/24/20   [provider]  aspirin EC 81 MG tablet Take 81 mg by mouth daily. Swallow whole.    [provider]  atorvastatin (LIPITOR) 80 MG tablet Take 80 mg by mouth daily.    [provider]  b complex vitamins capsule Take 1 capsule by mouth daily.    [provider]  divalproex (DEPAKOTE ER) 500 MG 24 hr tablet Take 1 tablet (500 mg total) by mouth at bedtime. 08/13/20   Suzzanne Cloud, NP  finasteride (PROSCAR) 5 MG tablet Take 5 mg by mouth daily.    [provider]  Rivaroxaban (XARELTO) 15 MG TABS tablet Take 1 tablet (15  mg total) by mouth daily with supper. 11/01/20   [name], MD    Physical Exam: Vitals:   11/19/20 2045 11/19/20 2100 11/19/20 2230 11/19/20 2318  BP:   (!) 151/88 (!) 160/83  Pulse: 85 74 76 (!) 52  Resp: (!) 27 (!) _0 Temp:    98.2 F (36.8 C)  TempSrc:    Oral  SpO2: 100% 100% 98% 99%    Physical Exam Constitutional:      General: He is not in acute distress. HENT:     Head: Normocephalic.  Eyes:     Comments: Pupillary reaction to light sluggish on the right  Cardiovascular:     Rate and Rhythm: Normal rate and regular rhythm.     Pulses: Normal pulses.  Pulmonary:     Effort: Pulmonary effort is normal. No respiratory distress.     Breath sounds: Normal breath sounds. No wheezing or rales.  Abdominal:     General: Bowel sounds are normal. There is no distension.  Palpations: Abdomen is soft.     Tenderness: There is no abdominal tenderness.  Musculoskeletal:        General: No swelling or tenderness.     Cervical back: Normal range of motion and neck supple.  Skin:    General: Skin is warm and dry.  Neurological:     Mental Status: He is alert.     Comments: Oriented to person and place only Strength intact in bilateral upper extremities. He is able to raise bilateral lower extremities up in the bed against gravity but right leg is noted to be slightly weaker. Sensation to light touch intact throughout.     Labs on Admission: I have personally reviewed following labs and imaging studies  CBC: Recent Labs  Lab 11/19/20 1751 11/19/20 1818  WBC 6.8  --   NEUTROABS 5.1  --   HGB 12.3* 12.2*  HCT 36.9* 36.0*  MCV 97.6  --   PLT 114*  --    Basic Metabolic Panel: Recent Labs  Lab 11/19/20 1751 11/19/20 1818  NA 137 139  K 4.7 4.8  CL 104 104  CO2 25  --   GLUCOSE 123* 119*  BUN 20 24*  CREATININE 1.34* 1.40*  CALCIUM 8.9  --    GFR: Estimated Creatinine Clearance: 40.4 mL/min (A) (by C-G formula based on SCr of 1.4 mg/dL  (H)). Liver Function Tests: Recent Labs  Lab 11/19/20 1751  AST 28  ALT 22  ALKPHOS 59  BILITOT 0.9  PROT 6.6  ALBUMIN 3.1*   No results for input(s): LIPASE, AMYLASE in the last 168 hours. No results for input(s): AMMONIA in the last 168 hours. Coagulation Profile: Recent Labs  Lab 11/19/20 1751  INR 1.3*   Cardiac Enzymes: No results for input(s): CKTOTAL, CKMB, CKMBINDEX, TROPONINI in the last 168 hours. BNP (last 3 results) No results for input(s): PROBNP in the last 8760 hours. HbA1C: No results for input(s): HGBA1C in the last 72 hours. CBG: No results for input(s): GLUCAP in the last 168 hours. Lipid Profile: No results for input(s): CHOL, HDL, LDLCALC, TRIG, CHOLHDL, LDLDIRECT in the last 72 hours. Thyroid Function Tests: No results for input(s): TSH, T4TOTAL, FREET4, T3FREE, THYROIDAB in the last 72 hours. Anemia Panel: Recent Labs    11/19/20 2013  VITAMINB12 869   Urine analysis:    Component Value Date/Time   COLORURINE YELLOW 10/16/2020 1000   APPEARANCEUR CLEAR 10/16/2020 1000   LABSPEC 1.013 10/16/2020 1000   PHURINE 7.0 10/16/2020 1000   GLUCOSEU NEGATIVE 10/16/2020 1000   HGBUR NEGATIVE 10/16/2020 1000   BILIRUBINUR NEGATIVE 10/16/2020 1000   KETONESUR NEGATIVE 10/16/2020 1000   PROTEINUR NEGATIVE 10/16/2020 1000   UROBILINOGEN 2.0 (H) 01/01/2012 1328   NITRITE NEGATIVE 10/16/2020 1000   LEUKOCYTESUR TRACE (A) 10/16/2020 1000    Radiological Exams on Admission: CT HEAD WO CONTRAST  Result Date: 11/19/2020 CLINICAL DATA:  Weakness and confusion. EXAM: CT HEAD WITHOUT CONTRAST TECHNIQUE: Contiguous axial images were obtained from the base of the skull through the vertex without intravenous contrast. COMPARISON:  CT head dated 03/15/2019. FINDINGS: Brain: A mixed density subdural fluid collection overlying the left cerebral convexity measures up to 3.0 cm in greatest depth and consistent with an acute on chronic subdural hematoma. There is  associated mass effect with 5 mm left to right midline shift. The basilar cisterns are patent. There is no hydrocephalus. Periventricular white matter hypoattenuation likely represents chronic small vessel ischemic disease. Vascular: There are vascular calcifications in  the carotid siphons. Skull: Normal. Negative for fracture or focal lesion. Sinuses/Orbits: Left maxillary sinus disease versus mucous retention cysts are noted. Other: None. 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 There is associated mass effect with 5 mm left to right midline shift. These results were called by telephone at the time of interpretation on 11/19/2020 at 7:09 pm to provider [facility] , who verbally acknowledged these results. Electronically Signed   By: Zerita Boers M.D.   On: 11/19/2020 19:13   CT Cervical Spine Wo Contrast  Result Date: 11/19/2020 CLINICAL DATA:  Multiple fall EXAM: CT CERVICAL SPINE WITHOUT CONTRAST TECHNIQUE: Multidetector CT imaging of the cervical spine was performed without intravenous contrast. Multiplanar CT image reconstructions were also generated. COMPARISON:  None. FINDINGS: Alignment: No subluxation.  Facet alignment is maintained. Skull base and vertebrae: No acute fracture. No primary bone lesion or focal pathologic process. Soft tissues and spinal canal: No prevertebral fluid or swelling. No visible canal hematoma. Disc levels: Multiple level degenerative change with moderate disc space narrowing at C3-C4, C5-C6 and C6-C7. Facet degenerative changes at multiple levels. Upper chest: Emphysema at the apices Other: None IMPRESSION: 1. Degenerative changes of the cervical spine. No acute osseous abnormality. 2. Emphysema at the apices. Emphysema (ICD10-J43.9). Electronically Signed   By: Donavan Foil M.D.   On: 11/19/2020 21:01   DG CHEST PORT 1 VIEW  Result Date: 11/19/2020 CLINICAL DATA:  Status post fall.  Weakness.  EXAM: PORTABLE CHEST 1 VIEW COMPARISON:  None. FINDINGS: The lungs are hyperinflated. Mild, diffuse, chronic appearing increased lung markings are seen. There is no evidence of acute infiltrate, pleural effusion or pneumothorax. The cardiac silhouette is moderately enlarged. An artificial aortic valve is present. Marked severity calcification of the aortic arch is seen. Multilevel degenerative changes seen throughout the thoracic spine. IMPRESSION: Chronic appearing increased lung markings without evidence of acute or active cardiopulmonary disease. Electronically Signed   By: Virgina Norfolk M.D.   On: 11/19/2020 23:49    EKG: Independently reviewed.  Atrial fibrillation, no significant change since prior tracing.  Assessment/Plan Principal Problem:   SDH (subdural hematoma) (HCC) Active Problems:   Hypercholesterolemia   CKD (chronic kidney disease), stage III (HCC)   Atrial fibrillation (HCC)   Frequent falls   Acute on chronic subdural hematoma with associated mass-effect: Patient is on Eliquis for permanent A. fib and severe aortic stenosis status post TAVR on 10/22/2020.  He is also on aspirin 81 mg daily. Reportedly fell 3 times in the past 24 hours. 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  There is associated mass-effect with 5 mm left to right midline shift. ED physician discussed the case with neurosurgery who recommended no anticoagulation reversal at this time.  Recommended holding Eliquis and repeating head CT in the morning.  Neurosurgery will consult. At the time of my evaluation, patient noted to have mild weakness of his right lower extremity and right eye sluggish pupillary response to light. I spoke to on-call neurosurgeon who again recommends holding off giving anticoagulation reversal. Recommends repeating head CT in the morning. Recommending antiepileptic for seizure prophylaxis. Patient is already on  Depakote and level was subtherapeutic on labs.  Spoke to pharmacist, recommending increasing dose of Depakote ER from 500 mg daily to 1000 mg daily. -Hold Eliquis and aspirin.  Depakote ordered.  Frequent neurochecks. Repeat head CT scheduled for  the morning.  Generalized weakness, frequent falls -SARS-CoV-2 PCR test pending.  Chest x-ray and UA ordered to assess for possible infectious source as cause of generalized weakness. PT/OT eval, fall precautions  Permanent A. Fib: Currently rate controlled. -Hold Eliquis.  No rate control agent listed in home medications, pharmacy med rec pending.  CKD stage III: Stable.  Renal function at baseline.  Hyperlipidemia -Check CK level given multiple falls.  If normal, resume statin after pharmacy med rec is done.  Pharmacy med rec pending.  DVT prophylaxis: SCDs Code Status: Full code.  Patient is very hard of hearing and I am not able to discuss CODE STATUS with him at this time.  Please readdress in the morning. Family Communication: No family available at this time. Disposition Plan: Status is: Observation  The patient remains OBS appropriate and will d/c before 2 midnights.  Dispo: The patient is from: Home              Anticipated d/c is to: Home              Anticipated d/c date is: 2 days              Patient currently is not medically stable to d/c.   Difficult to place patient No  Consults called: Neurosurgery (Dr. [name]) Level of care: Level of care: Progressive   The medical decision making on this patient was of high complexity and the patient is at high risk for clinical deterioration, therefore this is a level 3 visit.  Shela Leff MD [facility]  If 7PM-7AM, please contact night-coverage [email or web address]  11/20/2020, 12:18 AM

## 2020-11-19 NOTE — ED Triage Notes (Signed)
Pt to triage via EMS for eval of weakness and confusion since Sunday. Has fallen several times in the last few days d/t weakness in the last three days. 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 Pt not using R leg correctly, denies pain but when moving from stretcher to wheelchair did not realize he needed to move that leg as he did the L leg. Pt endorses "not being able to keep my balance," unsure of month.

## 2020-11-19 NOTE — ED Provider Notes (Signed)
[facility] EMERGENCY DEPARTMENT Provider Note   CSN: [id number] Arrival date & time: 11/19/20  1726     History Chief Complaint  Patient presents with  . Weakness    Cesar [name] is a 87 y.o. male.  HPI   Pt arrives, son states that he has had lots of falls - off balance - can't walk well - 3 falls in 24 hours - has hit his head - they think - very weak.  Lives with son Marcelino Scot [phone]) - recently very weak.  No c/o fevers, chills, nausea, vomiting or diarrhea.  No cough / sob, no CP, no Abd Pain.  On a blood thinner - had been changed from one noac to another recently.    [place] out of bed last night.  Has perm afib.  His legs are not working right according to son - seems to be more the right side.  No speech issues.  Sx are constant - gradually worsening - no associated f/c/n/v.  Usually takes his thinner at night - didn't have it tonight.  Past Medical History:  Diagnosis Date  . AAA (abdominal aortic aneurysm) ([place])   . CKD (chronic kidney disease), stage III (HCC)    baseline Cr 1.4-1.5 in 2016-2017 by PCP)  . CVA (cerebral infarction)   . Depression   . DVT (deep venous thrombosis) ([place]) 2006  . H/O blood clots   . Hematuria   . HOH (hard of hearing)   . Hypercholesterolemia   . OSA (obstructive sleep apnea)    intol to CPAP  . Permanent atrial fibrillation ([place])   . Prostate enlargement   . Pulmonary nodules   . 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 Buena Irish and Dr. Roxy Manns  . Thoracic ascending aortic aneurysm ([place])    4.5cm in 11/2018, stable since 2016  . TIA (transient ischemic attack)     Patient Active Problem List   Diagnosis Date Noted  . Pressure injury of skin 10/23/2020  . 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 Severe aortic stenosis   . Syncope 10/16/2020  . Gait abnormality 04/15/2020  . Confusion 11/16/2019   . Cerebrovascular accident (CVA) ([place]) 11/16/2019  . 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 Acute ischemic stroke ([place]) 03/15/2019  . PAD (peripheral artery disease) ([place]) 07/23/2018  . Ischemia of lower extremity 07/12/2016  . Acute blood loss anemia 07/12/2016  . Depression 07/12/2016  . BPH (benign prostatic hyperplasia) 07/12/2016  . AAA (abdominal aortic aneurysm) ([place])   . Atrial fibrillation ([place])   . Embolism ([place]) 07/02/2016  . CKD (chronic kidney disease), stage III ([place])   . Claudication ([place]) 06/04/2016  . Long term current use of anticoagulant 06/04/2016  . Abnormal ECG 06/04/2016  . Thoracic ascending aortic aneurysm ([place]) 04/22/2015  . Permanent atrial fibrillation ([place]) 05/11/2013  . Mild aortic stenosis 05/11/2013  . Orthostatic hypotension 05/11/2013  . Hypercholesterolemia 05/11/2013    Past Surgical History:  Procedure Laterality Date  . EMBOLECTOMY Left 07/02/2016   Procedure: EMBOLECTOMY LEFT FEMORAL ARTERY;  Surgeon: [name], MD;  Location: [facility];  Service: Vascular;  Laterality: Left;  Marland Kitchen MESENTERIC ARTERY BYPASS N/A 08/06/2019   Procedure: SUPERIOR MESENTERIC ARTERY THROMBECTOMY;  Surgeon: [name], MD;  Location: [facility];  Service: Vascular;  Laterality: N/A;  . NM [place]  WALL MOTION  08/24/2002   markedly positive,subtle lateral ischemia  . PATCH ANGIOPLASTY  08/06/2019   Procedure: Patch Angioplasty of superior messenteric artery with RIGHT femoral vein;  Surgeon: [name], MD;  Location: [facility];  Service: Vascular;;  . PROSTATE SURGERY    . RIGHT HEART CATH AND CORONARY ANGIOGRAPHY N/A 10/21/2020   Procedure: RIGHT HEART CATH AND CORONARY ANGIOGRAPHY;  Surgeon: [name], MD;  Location: [facility];  Service: Cardiovascular;  Laterality: N/A;  . TEE WITHOUT CARDIOVERSION N/A 10/22/2020   Procedure: TRANSESOPHAGEAL ECHOCARDIOGRAM (TEE);   Surgeon: [name], MD;  Location: [facility];  Service: Open Heart Surgery;  Laterality: N/A;  . TRANSCATHETER AORTIC VALVE REPLACEMENT, TRANSFEMORAL Right 10/22/2020   Procedure: TRANSCATHETER AORTIC VALVE REPLACEMENT, TRANSFEMORAL;  Surgeon: [name], MD;  Location: [facility];  Service: Open Heart Surgery;  Laterality: Right;  . US ECHOCARDIOGRAPHY  01/06/2012   mod. AOV ca+,mild to mod. AS,mod mostly posterior MAC-mild MR       Family History  Problem Relation Age of Onset  . Heart failure Mother   . Diabetes Father   . Stroke Father     Social History   Tobacco Use  . Smoking status: Former Smoker    Packs/day: 1.00    Years: 10.00    Pack years: 10.00    Types: Cigarettes    Quit date: 10/05/1952    Years since quitting: 68.1  . Smokeless tobacco: Never Used  Vaping Use  . Vaping Use: Never used  Substance Use Topics  . Alcohol use: Not Currently    Comment: occasional beer  . Drug use: Never    Home Medications Prior to Admission medications   Medication Sig Start Date End Date Taking? Authorizing Provider  acetaminophen (TYLENOL) 500 MG tablet as needed.    [provider]  amoxicillin (AMOXIL) 500 MG tablet Take 4 tablets (2000 mg) one hour prior to all dental visits. 10/30/20   Eileen Stanford, PA-C  apixaban (ELIQUIS) 5 MG TABS tablet Take 1 tablet by mouth daily. 07/24/20   [provider]  aspirin EC 81 MG tablet Take 81 mg by mouth daily. Swallow whole.    [provider]  atorvastatin (LIPITOR) 80 MG tablet Take 80 mg by mouth daily.    [provider]  b complex vitamins capsule Take 1 capsule by mouth daily.    [provider]  divalproex (DEPAKOTE ER) 500 MG 24 hr tablet Take 1 tablet (500 mg total) by mouth at bedtime. 08/13/20   Suzzanne Cloud, NP  finasteride (PROSCAR) 5 MG tablet Take 5 mg by mouth daily.    [provider]  Rivaroxaban (XARELTO) 15 MG TABS tablet Take 1 tablet  (15 mg total) by mouth daily with supper. 11/01/20   Croitoru, Mihai, MD    Allergies    Flomax [tamsulosin hcl], Levetiracetam, and No known allergies  Review of Systems   Review of Systems  All other systems reviewed and are negative.   Physical Exam Updated Vital Signs BP (!) 145/61 (BP Location: Right Arm)   Pulse (!) 109   Temp 98.9 F (37.2 C) (Oral)   Resp 18   SpO2 97%   Physical Exam Vitals and nursing note reviewed.  Constitutional:      General: He is not in acute distress.    Appearance: He is well-developed and well-nourished.  HENT:     Head: Normocephalic and atraumatic.     Mouth/Throat:  Mouth: Oropharynx is clear and moist.     Pharynx: No oropharyngeal exudate.  Eyes:     General: No scleral icterus.       Right eye: No discharge.        Left eye: No discharge.     Extraocular Movements: EOM normal.     Conjunctiva/sclera: Conjunctivae normal.     Pupils: Pupils are equal, round, and reactive to light.  Neck:     Thyroid: No thyromegaly.     Vascular: No JVD.  Cardiovascular:     Rate and Rhythm: Normal rate and regular rhythm.     Pulses: Intact distal pulses.     Heart sounds: Normal heart sounds. No murmur heard. No friction rub. No gallop.   Pulmonary:     Effort: Pulmonary effort is normal. No respiratory distress.     Breath sounds: Normal breath sounds. No wheezing or rales.  Abdominal:     General: Bowel sounds are normal. There is no distension.     Palpations: Abdomen is soft. There is no mass.     Tenderness: There is no abdominal tenderness.  Musculoskeletal:        General: No tenderness or edema. Normal range of motion.     Cervical back: Normal range of motion and neck supple.  Lymphadenopathy:     Cervical: No cervical adenopathy.  Skin:    General: Skin is warm and dry.     Findings: No erythema or rash.  Neurological:     Mental Status: He is alert.     Coordination: Coordination normal.     Comments: The patient is  hard of hearing, he has a little bit of difficulty lifting his right leg compared to the left leg, equal grips bilaterally, normal facial symmetry, normal speech, normal memory  Psychiatric:        Mood and Affect: Mood and affect normal.        Behavior: Behavior normal.     ED Results / Procedures / Treatments   Labs (all labs ordered are listed, but only abnormal results are displayed) Labs Reviewed  PROTIME-INR - Abnormal; Notable for the following components:      Result Value   Prothrombin Time 15.7 (*)    INR 1.3 (*)    All other components within normal limits  CBC - Abnormal; Notable for the following components:   RBC 3.78 (*)    Hemoglobin 12.3 (*)    HCT 36.9 (*)    Platelets 114 (*)    All other components within normal limits  COMPREHENSIVE METABOLIC PANEL - Abnormal; Notable for the following components:   Glucose, Bld 123 (*)    Creatinine, Ser 1.34 (*)    Albumin 3.1 (*)    GFR, Estimated 51 (*)    All other components within normal limits  I-STAT CHEM 8, ED - Abnormal; Notable for the following components:   BUN 24 (*)    Creatinine, Ser 1.40 (*)    Glucose, Bld 119 (*)    Hemoglobin 12.2 (*)    HCT 36.0 (*)    All other components within normal limits  SARS CORONAVIRUS 2 (TAT 6-24 HRS)  APTT  DIFFERENTIAL  VITAMIN B12  VALPROIC ACID LEVEL  CBG MONITORING, ED    EKG None  Radiology CT HEAD WO CONTRAST  Result Date: 11/19/2020 CLINICAL DATA:  Weakness and confusion. EXAM: CT HEAD WITHOUT CONTRAST TECHNIQUE: Contiguous axial images were obtained from the base of the skull through the  vertex without intravenous contrast. COMPARISON:  CT head dated 03/15/2019. FINDINGS: Brain: A mixed density subdural fluid collection overlying the left cerebral convexity measures up to 3.0 cm in greatest depth and consistent with an acute on chronic subdural hematoma. There is associated mass effect with 5 mm left to right midline shift. The basilar cisterns are patent.  There is no hydrocephalus. Periventricular white matter hypoattenuation likely represents chronic small vessel ischemic disease. Vascular: There are vascular calcifications in the carotid siphons. Skull: Normal. Negative for fracture or focal lesion. Sinuses/Orbits: Left maxillary sinus disease versus mucous retention cysts are noted. Other: None. 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 There is associated mass effect with 5 mm left to right midline shift. These results were called by telephone at the time of interpretation on 11/19/2020 at 7:09 pm to provider [facility] , who verbally acknowledged these results. Electronically Signed   By: Zerita Boers M.D.   On: 11/19/2020 19:13    Procedures .Critical Care Performed by: Noemi Chapel, MD Authorized by: Noemi Chapel, MD   Critical care provider statement:    Critical care time (minutes):  35   Critical care time was exclusive of:  Separately billable procedures and treating other patients and teaching time   Critical care was necessary to treat or prevent imminent or life-threatening deterioration of the following conditions:  CNS failure or compromise   Critical care was time spent personally by me on the following activities:  Blood draw for specimens, development of treatment plan with patient or surrogate, discussions with consultants, evaluation of patient's response to treatment, examination of patient, obtaining history from patient or surrogate, ordering and performing treatments and interventions, ordering and review of laboratory studies, ordering and review of radiographic studies, pulse oximetry, re-evaluation of patient's condition and review of old charts     Medications Ordered in ED Medications  sodium chloride flush (NS) 0.9 % injection 3 mL (has no administration in time range)    ED Course  I have reviewed the triage vital signs and the nursing  notes.  Pertinent labs & imaging results that were available during my care of the patient were reviewed by me and considered in my medical decision making (see chart for details).    MDM Rules/Calculators/A&P                          The patient has no tenderness over the spine, no tenderness over the scalp, no obvious hematomas to the scalp.  At this time the patient's vital signs are unremarkable except for a mild tachycardia, his EKG does show atrial fibrillation which we know that he has, he likely needs his home medications.  His last blood thinner was last night.  It has been 24 hours.  He does have some acute on chronic subdural hematoma present, I will discuss with spoke with the neurosurgeon.  After discussion with the son he thinks that they would want to have a procedure to drain the blood since he is living at home and rather independent up to this point.  Critically ill  NS paged at 8:20 PM.  Discussed with Neurosurgery who recommends that no reversal is needed - recommends CT in the morning - hold the anticoagulant and consider d/c vs placement at 48 hours.  They will follow.  Hospitalist paged at 8:45 PM  Final Clinical Impression(s) / ED Diagnoses Final diagnoses:  Subdural hematoma (HCC)      Noemi Chapel, MD 11/24/20 [phone]

## 2020-11-20 ENCOUNTER — Other Ambulatory Visit: Payer: Self-pay | Admitting: Neurosurgery

## 2020-11-20 ENCOUNTER — Observation Stay (HOSPITAL_COMMUNITY): Payer: Medicare Other

## 2020-11-20 DIAGNOSIS — Z86718 Personal history of other venous thrombosis and embolism: Secondary | ICD-10-CM | POA: Diagnosis not present

## 2020-11-20 DIAGNOSIS — W19XXXD Unspecified fall, subsequent encounter: Secondary | ICD-10-CM | POA: Diagnosis present

## 2020-11-20 DIAGNOSIS — N183 Chronic kidney disease, stage 3 unspecified: Secondary | ICD-10-CM | POA: Diagnosis not present

## 2020-11-20 DIAGNOSIS — I6201 Nontraumatic acute subdural hemorrhage: Secondary | ICD-10-CM | POA: Diagnosis not present

## 2020-11-20 DIAGNOSIS — R4701 Aphasia: Secondary | ICD-10-CM | POA: Diagnosis not present

## 2020-11-20 DIAGNOSIS — N401 Enlarged prostate with lower urinary tract symptoms: Secondary | ICD-10-CM | POA: Diagnosis present

## 2020-11-20 DIAGNOSIS — D696 Thrombocytopenia, unspecified: Secondary | ICD-10-CM | POA: Diagnosis not present

## 2020-11-20 DIAGNOSIS — R918 Other nonspecific abnormal finding of lung field: Secondary | ICD-10-CM | POA: Diagnosis present

## 2020-11-20 DIAGNOSIS — R1310 Dysphagia, unspecified: Secondary | ICD-10-CM | POA: Diagnosis not present

## 2020-11-20 DIAGNOSIS — L89322 Pressure ulcer of left buttock, stage 2: Secondary | ICD-10-CM | POA: Diagnosis not present

## 2020-11-20 DIAGNOSIS — I4891 Unspecified atrial fibrillation: Secondary | ICD-10-CM | POA: Diagnosis not present

## 2020-11-20 DIAGNOSIS — S065X9A Traumatic subdural hemorrhage with loss of consciousness of unspecified duration, initial encounter: Secondary | ICD-10-CM | POA: Diagnosis not present

## 2020-11-20 DIAGNOSIS — N400 Enlarged prostate without lower urinary tract symptoms: Secondary | ICD-10-CM | POA: Diagnosis present

## 2020-11-20 DIAGNOSIS — R29810 Facial weakness: Secondary | ICD-10-CM | POA: Diagnosis present

## 2020-11-20 DIAGNOSIS — G4733 Obstructive sleep apnea (adult) (pediatric): Secondary | ICD-10-CM | POA: Diagnosis not present

## 2020-11-20 DIAGNOSIS — S065X0D Traumatic subdural hemorrhage without loss of consciousness, subsequent encounter: Secondary | ICD-10-CM | POA: Diagnosis not present

## 2020-11-20 DIAGNOSIS — I129 Hypertensive chronic kidney disease with stage 1 through stage 4 chronic kidney disease, or unspecified chronic kidney disease: Secondary | ICD-10-CM | POA: Diagnosis not present

## 2020-11-20 DIAGNOSIS — D62 Acute posthemorrhagic anemia: Secondary | ICD-10-CM | POA: Diagnosis not present

## 2020-11-20 DIAGNOSIS — I6203 Nontraumatic chronic subdural hemorrhage: Secondary | ICD-10-CM | POA: Diagnosis not present

## 2020-11-20 DIAGNOSIS — G40909 Epilepsy, unspecified, not intractable, without status epilepticus: Secondary | ICD-10-CM | POA: Diagnosis present

## 2020-11-20 DIAGNOSIS — R531 Weakness: Secondary | ICD-10-CM | POA: Diagnosis not present

## 2020-11-20 DIAGNOSIS — I6200 Nontraumatic subdural hemorrhage, unspecified: Secondary | ICD-10-CM | POA: Diagnosis not present

## 2020-11-20 DIAGNOSIS — I6782 Cerebral ischemia: Secondary | ICD-10-CM | POA: Diagnosis not present

## 2020-11-20 DIAGNOSIS — E7800 Pure hypercholesterolemia, unspecified: Secondary | ICD-10-CM | POA: Diagnosis present

## 2020-11-20 DIAGNOSIS — R3915 Urgency of urination: Secondary | ICD-10-CM | POA: Diagnosis present

## 2020-11-20 DIAGNOSIS — G8191 Hemiplegia, unspecified affecting right dominant side: Secondary | ICD-10-CM | POA: Diagnosis not present

## 2020-11-20 DIAGNOSIS — R2689 Other abnormalities of gait and mobility: Secondary | ICD-10-CM | POA: Diagnosis not present

## 2020-11-20 DIAGNOSIS — R4182 Altered mental status, unspecified: Secondary | ICD-10-CM | POA: Diagnosis not present

## 2020-11-20 DIAGNOSIS — G319 Degenerative disease of nervous system, unspecified: Secondary | ICD-10-CM | POA: Diagnosis not present

## 2020-11-20 DIAGNOSIS — G9349 Other encephalopathy: Secondary | ICD-10-CM | POA: Diagnosis not present

## 2020-11-20 DIAGNOSIS — R1312 Dysphagia, oropharyngeal phase: Secondary | ICD-10-CM | POA: Diagnosis not present

## 2020-11-20 DIAGNOSIS — I739 Peripheral vascular disease, unspecified: Secondary | ICD-10-CM | POA: Diagnosis present

## 2020-11-20 DIAGNOSIS — Z7901 Long term (current) use of anticoagulants: Secondary | ICD-10-CM | POA: Diagnosis not present

## 2020-11-20 DIAGNOSIS — B9620 Unspecified Escherichia coli [E. coli] as the cause of diseases classified elsewhere: Secondary | ICD-10-CM | POA: Diagnosis not present

## 2020-11-20 DIAGNOSIS — R296 Repeated falls: Secondary | ICD-10-CM | POA: Diagnosis present

## 2020-11-20 DIAGNOSIS — E872 Acidosis: Secondary | ICD-10-CM | POA: Diagnosis not present

## 2020-11-20 DIAGNOSIS — I714 Abdominal aortic aneurysm, without rupture: Secondary | ICD-10-CM | POA: Diagnosis not present

## 2020-11-20 DIAGNOSIS — I4811 Longstanding persistent atrial fibrillation: Secondary | ICD-10-CM | POA: Diagnosis not present

## 2020-11-20 DIAGNOSIS — N1831 Chronic kidney disease, stage 3a: Secondary | ICD-10-CM | POA: Diagnosis present

## 2020-11-20 DIAGNOSIS — R471 Dysarthria and anarthria: Secondary | ICD-10-CM | POA: Diagnosis not present

## 2020-11-20 DIAGNOSIS — I482 Chronic atrial fibrillation, unspecified: Secondary | ICD-10-CM | POA: Diagnosis not present

## 2020-11-20 DIAGNOSIS — R9401 Abnormal electroencephalogram [EEG]: Secondary | ICD-10-CM | POA: Diagnosis not present

## 2020-11-20 DIAGNOSIS — N390 Urinary tract infection, site not specified: Secondary | ICD-10-CM | POA: Diagnosis not present

## 2020-11-20 DIAGNOSIS — J189 Pneumonia, unspecified organism: Secondary | ICD-10-CM | POA: Diagnosis not present

## 2020-11-20 DIAGNOSIS — I350 Nonrheumatic aortic (valve) stenosis: Secondary | ICD-10-CM | POA: Diagnosis not present

## 2020-11-20 DIAGNOSIS — I4821 Permanent atrial fibrillation: Secondary | ICD-10-CM | POA: Diagnosis not present

## 2020-11-20 DIAGNOSIS — J690 Pneumonitis due to inhalation of food and vomit: Secondary | ICD-10-CM | POA: Diagnosis not present

## 2020-11-20 DIAGNOSIS — Z952 Presence of prosthetic heart valve: Secondary | ICD-10-CM | POA: Diagnosis not present

## 2020-11-20 DIAGNOSIS — I517 Cardiomegaly: Secondary | ICD-10-CM | POA: Diagnosis not present

## 2020-11-20 DIAGNOSIS — R319 Hematuria, unspecified: Secondary | ICD-10-CM | POA: Diagnosis not present

## 2020-11-20 DIAGNOSIS — I712 Thoracic aortic aneurysm, without rupture: Secondary | ICD-10-CM | POA: Diagnosis not present

## 2020-11-20 DIAGNOSIS — N1832 Chronic kidney disease, stage 3b: Secondary | ICD-10-CM | POA: Diagnosis not present

## 2020-11-20 DIAGNOSIS — F32A Depression, unspecified: Secondary | ICD-10-CM | POA: Diagnosis present

## 2020-11-20 DIAGNOSIS — R569 Unspecified convulsions: Secondary | ICD-10-CM | POA: Diagnosis not present

## 2020-11-20 DIAGNOSIS — I2510 Atherosclerotic heart disease of native coronary artery without angina pectoris: Secondary | ICD-10-CM | POA: Diagnosis not present

## 2020-11-20 DIAGNOSIS — Z20822 Contact with and (suspected) exposure to covid-19: Secondary | ICD-10-CM | POA: Diagnosis not present

## 2020-11-20 DIAGNOSIS — Z8673 Personal history of transient ischemic attack (TIA), and cerebral infarction without residual deficits: Secondary | ICD-10-CM | POA: Diagnosis not present

## 2020-11-20 DIAGNOSIS — E785 Hyperlipidemia, unspecified: Secondary | ICD-10-CM | POA: Diagnosis present

## 2020-11-20 DIAGNOSIS — G9389 Other specified disorders of brain: Secondary | ICD-10-CM | POA: Diagnosis not present

## 2020-11-20 DIAGNOSIS — H9190 Unspecified hearing loss, unspecified ear: Secondary | ICD-10-CM | POA: Diagnosis not present

## 2020-11-20 DIAGNOSIS — S065X0S Traumatic subdural hemorrhage without loss of consciousness, sequela: Secondary | ICD-10-CM | POA: Diagnosis not present

## 2020-11-20 LAB — CBC
HCT: 35.3 % — ABNORMAL LOW (ref 39.0–52.0)
Hemoglobin: 11.6 g/dL — ABNORMAL LOW (ref 13.0–17.0)
MCH: 32.1 pg (ref 26.0–34.0)
MCHC: 32.9 g/dL (ref 30.0–36.0)
MCV: 97.8 fL (ref 80.0–100.0)
Platelets: 107 10*3/uL — ABNORMAL LOW (ref 150–400)
RBC: 3.61 MIL/uL — ABNORMAL LOW (ref 4.22–5.81)
RDW: 13.2 % (ref 11.5–15.5)
WBC: 6.6 10*3/uL (ref 4.0–10.5)
nRBC: 0 % (ref 0.0–0.2)

## 2020-11-20 LAB — GLUCOSE, CAPILLARY: Glucose-Capillary: 141 mg/dL — ABNORMAL HIGH (ref 70–99)

## 2020-11-20 LAB — URINALYSIS, ROUTINE W REFLEX MICROSCOPIC
Bilirubin Urine: NEGATIVE
Glucose, UA: NEGATIVE mg/dL
Ketones, ur: NEGATIVE mg/dL
Leukocytes,Ua: NEGATIVE
Nitrite: POSITIVE — AB
Protein, ur: NEGATIVE mg/dL
Specific Gravity, Urine: 1.013 (ref 1.005–1.030)
pH: 7 (ref 5.0–8.0)

## 2020-11-20 LAB — CK: Total CK: 56 U/L (ref 49–397)

## 2020-11-20 LAB — SARS CORONAVIRUS 2 (TAT 6-24 HRS): SARS Coronavirus 2: NEGATIVE

## 2020-11-20 MED ORDER — DIVALPROEX SODIUM ER 500 MG PO TB24
1000.0000 mg | ORAL_TABLET | Freq: Every day | ORAL | Status: DC
Start: 2020-11-20 — End: 2020-11-21
  Administered 2020-11-20: 1000 mg via ORAL
  Filled 2020-11-20 (×2): qty 2

## 2020-11-20 MED ORDER — FINASTERIDE 5 MG PO TABS
5.0000 mg | ORAL_TABLET | Freq: Every day | ORAL | Status: DC
  Administered 2020-11-22 – 2020-12-02 (×10): 5 mg via ORAL
  Filled 2020-11-20 (×10): qty 1

## 2020-11-20 MED ORDER — DIVALPROEX SODIUM ER 500 MG PO TB24: 500.0000 mg | ORAL_TABLET | Freq: Every day | ORAL | Status: DC

## 2020-11-20 NOTE — Progress Notes (Signed)
Transition of Care [facility]) - CAGE-AID Screening   Patient Details  Name: Cesar [name] MRN: [id number] Date of Birth: 1929-04-24  Contact:    Verdie Shire, RN Phone Number: 11/20/2020, 1:15 PM   Clinical Narrative: Pt only oriented to person and place. Unable to participate.    CAGE-AID Screening: Substance Abuse Screening unable to be completed due to: : Patient unable to participate (Pt only oriented to person and place.)

## 2020-11-20 NOTE — Progress Notes (Signed)
Inpatient Rehab Admissions Coordinator Note:   Per PT recommendations, pt was screened for CIR candidacy by Shann Medal, PT, DPT.  At this time note discussion of surgical management of SDH versus comfort care.  If patient/family going to pursue surgical management I would recommend CIR consult, otherwise no consult needed. Please contact me with questions.   Shann Medal, PT, DPT [phone] 11/20/20 1:43 PM

## 2020-11-20 NOTE — Consult Note (Signed)
CC: weakness, confusion  HPI:     Patient is a 87 y.o. male with Afib on Eliquis, CKD, severe aortic stenosis, hx of CVA, epilepsy, AAA, TAVR last month presented to the hospital with weakness and increasing falls and confusion.  His family noted he was having difficulty moving his right leg and has had multiple falls the last few days, though he denies head trauma.  A CT head showed a large chronic L SDH.   Patient Active Problem List   Diagnosis Date Noted  . Frequent falls 11/20/2020  . SDH (subdural hematoma) ([place]) 11/19/2020  . Pressure injury of skin 10/23/2020  . 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 Severe aortic stenosis   . Syncope 10/16/2020  . Gait abnormality 04/15/2020  . Confusion 11/16/2019  . Cerebrovascular accident (CVA) ([place]) 11/16/2019  . 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 Acute ischemic stroke ([place]) 03/15/2019  . PAD (peripheral artery disease) ([place]) 07/23/2018  . Ischemia of lower extremity 07/12/2016  . Acute blood loss anemia 07/12/2016  . Depression 07/12/2016  . BPH (benign prostatic hyperplasia) 07/12/2016  . AAA (abdominal aortic aneurysm) ([place])   . Atrial fibrillation ([place])   . Embolism ([place]) 07/02/2016  . CKD (chronic kidney disease), stage III ([place])   . Claudication ([place]) 06/04/2016  . Long term current use of anticoagulant 06/04/2016  . Abnormal ECG 06/04/2016  . Thoracic ascending aortic aneurysm ([place]) 04/22/2015  . Permanent atrial fibrillation ([place]) 05/11/2013  . Mild aortic stenosis 05/11/2013  . Orthostatic hypotension 05/11/2013  . Hypercholesterolemia 05/11/2013   Past Medical History:  Diagnosis Date  . AAA (abdominal aortic aneurysm) ([place])   . CKD (chronic kidney disease), stage III (HCC)    baseline Cr 1.4-1.5 in 2016-2017 by PCP)  . CVA (cerebral infarction)   . Depression   . DVT (deep venous thrombosis) ([place]) 2006  . H/O blood  clots   . Hematuria   . HOH (hard of hearing)   . Hypercholesterolemia   . OSA (obstructive sleep apnea)    intol to CPAP  . Permanent atrial fibrillation ([place])   . Prostate enlargement   . Pulmonary nodules   . 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 Buena Irish and Dr. Roxy Manns  . Thoracic ascending aortic aneurysm ([place])    4.5cm in 11/2018, stable since 2016  . TIA (transient ischemic attack)     Past Surgical History:  Procedure Laterality Date  . EMBOLECTOMY Left 07/02/2016   Procedure: EMBOLECTOMY LEFT FEMORAL ARTERY;  Surgeon: [name], MD;  Location: [facility];  Service: Vascular;  Laterality: Left;  Marland Kitchen MESENTERIC ARTERY BYPASS N/A 08/06/2019   Procedure: SUPERIOR MESENTERIC ARTERY THROMBECTOMY;  Surgeon: [name], MD;  Location: [facility];  Service: Vascular;  Laterality: N/A;  . NM [place]  08/24/2002   markedly positive,subtle lateral ischemia  . PATCH ANGIOPLASTY  08/06/2019   Procedure: Patch Angioplasty of superior messenteric artery with RIGHT femoral vein;  Surgeon: [name], MD;  Location: [facility];  Service: Vascular;;  . PROSTATE SURGERY    . RIGHT HEART CATH AND CORONARY ANGIOGRAPHY N/A 10/21/2020   Procedure: RIGHT HEART CATH AND CORONARY ANGIOGRAPHY;  Surgeon: [name], MD;  Location: [facility];  Service: Cardiovascular;  Laterality: N/A;  . TEE WITHOUT CARDIOVERSION N/A 10/22/2020   Procedure: TRANSESOPHAGEAL  ECHOCARDIOGRAM (TEE);  Surgeon: [name], MD;  Location: [facility];  Service: Open Heart Surgery;  Laterality: N/A;  . TRANSCATHETER AORTIC VALVE REPLACEMENT, TRANSFEMORAL Right 10/22/2020   Procedure: TRANSCATHETER AORTIC VALVE REPLACEMENT, TRANSFEMORAL;  Surgeon: [name], MD;  Location: [facility];  Service: Open Heart Surgery;  Laterality: Right;  . US ECHOCARDIOGRAPHY  01/06/2012   mod. AOV ca+,mild to mod.  AS,mod mostly posterior MAC-mild MR    Medications Prior to Admission  Medication Sig Dispense Refill Last Dose  . acetaminophen (TYLENOL) 500 MG tablet as needed.     Marland Kitchen amoxicillin (AMOXIL) 500 MG tablet Take 4 tablets (2000 mg) one hour prior to all dental visits. 8 tablet 11   . apixaban (ELIQUIS) 5 MG TABS tablet Take 1 tablet by mouth daily.     Marland Kitchen aspirin EC 81 MG tablet Take 81 mg by mouth daily. Swallow whole.     Marland Kitchen atorvastatin (LIPITOR) 80 MG tablet Take 80 mg by mouth daily.     Marland Kitchen b complex vitamins capsule Take 1 capsule by mouth daily.     . divalproex (DEPAKOTE ER) 500 MG 24 hr tablet Take 1 tablet (500 mg total) by mouth at bedtime. 30 tablet 11   . finasteride (PROSCAR) 5 MG tablet Take 5 mg by mouth daily.     . Rivaroxaban (XARELTO) 15 MG TABS tablet Take 1 tablet (15 mg total) by mouth daily with supper. 90 tablet 3    Allergies  Allergen Reactions  . Flomax [Tamsulosin Hcl] Other (See Comments)    Can't pee  . Levetiracetam Other (See Comments)    Social History   Tobacco Use  . Smoking status: Former Smoker    Packs/day: 1.00    Years: 10.00    Pack years: 10.00    Types: Cigarettes    Quit date: 10/05/1952    Years since quitting: 68.1  . Smokeless tobacco: Never Used  Substance Use Topics  . Alcohol use: Not Currently    Comment: occasional beer    Family History  Problem Relation Age of Onset  . Heart failure Mother   . Diabetes Father   . Stroke Father      Review of Systems Review of systems not obtained due to patient factors.  Objective:   Patient Vitals for the past 8 hrs:  BP Temp Temp src Pulse Resp SpO2  11/20/20 1152 (!) 153/86 97.6 F (36.4 C) Oral 69 20 99 %  11/20/20 0818 (!) 156/73 98.3 F (36.8 C) Oral 74 20 99 %   I/O last 3 completed shifts: In: 3 [I.V.:3] Out: 450 [Urine:450] No intake/output data recorded.    Awake, alert.  Speech dysarthric, word finding difficulty.  Able to mimic commands but difficulty following  simple commands.  RUE pronator drift, RLE 3/5.  Mild R facial droop.  Data Review:  CT head without contrast shows large partially loculated L chronic SDH.  Repeat CT head performed today was stable  Assessment:   Principal Problem:   SDH (subdural hematoma) (HCC) Active Problems:   Hypercholesterolemia   CKD (chronic kidney disease), stage III (HCC)   Atrial fibrillation (HCC)   Frequent falls  Large L chronic SDH with loculations  Plan:   - given the size of the SDH and the associated neurologic deficits, I recommend drainage via bur holes, with possible craniotomy.  However, with drainage there likely still would be a high level of recurrent so he will likely need to hold of  on anticoagulation for 6 weeks, and aspirin for at least 1 week.  Given his age and extensive comorbidities, another option would be palliative care if the family wishes to avoid aggressive options at this point or if his medical health would make him high risk for general anesthesia.  I will discuss with them later today but otherwise am planning for bur hole drainage tomorrow, 48 hrs after his last Eliquis dose

## 2020-11-20 NOTE — Evaluation (Signed)
Occupational Therapy Evaluation Patient Details Name: PHI AVANS MRN: [id number] DOB: [birth year] Today's Date: 11/20/2020    History of Present Illness 87 y.o. male with medical history significant of permanent A. fib on Eliquis, CKD stage III, CVA, seizure disorder, depression, history of DVT, hyperlipidemia, OSA not on CPAP, AAA, ascending thoracic aortic aneurysm followed by Dr. [name], severe aortic stenosis status post TAVR 10/22/2020, nonobstructive CAD presenting to the ED via EMS for evaluation of weakness and multiple falls. Head CT showing acute on chronic left cerebral convexity subdural hematoma.   Clinical Impression   Pt admitted with the above diagnoses and presents with below problem list. Pt will benefit from continued acute OT to address the below listed deficits and maximize independence with basic ADLs prior to d/c to venue below. At baseline pt is mod I with basic ADLs, lives with his spouse. Pt presents with R side weakness and decreased coordination, impaired balance and suspect cognitive deficits (difficult to fully assess 2/2 HOH and hearing aid batteries were charging at time of OT eval). Given pt's baseline of mod I and family support feel he would benefit from intensive rehab venue at time of d/c, prior to returning home    Follow Up Recommendations  CIR;Supervision/Assistance - 24 hour    Equipment Recommendations  Wheelchair (measurements OT);Wheelchair cushion (measurements OT) (if d/c home tomorrow)    Recommendations for Other Services Rehab consult     Precautions / Restrictions Precautions Precautions: Fall Restrictions Weight Bearing Restrictions: No Other Position/Activity Restrictions: very HOH      Mobility Bed Mobility Overal bed mobility: Needs Assistance Bed Mobility: Supine to Sit     Supine to sit: Mod assist;HOB elevated (HOB partially elevated)     General bed mobility comments: Difficulty sequencing movements. Multimodal cueing  needed and some assist to fully advance RLE off bed and powerup trunkand hips to full EOB position    Transfers Overall transfer level: Needs assistance Equipment used: Rolling walker (2 wheeled) Transfers: Sit to/from Stand Sit to Stand: Min assist;Mod assist         General transfer comment: Assist to stabilize rw for pt to pull up on with both hands (pt preference). Min A to rise, mod A to control descent with pt initiating sitting beofre fully in front of seat, Struggled with sequencing pivotal movements and rw.    Balance Overall balance assessment: Needs assistance Sitting-balance support: No upper extremity supported;Feet supported Sitting balance-Leahy Scale: Fair     Standing balance support: Bilateral upper extremity supported Standing balance-Leahy Scale: Poor Standing balance comment: reliant on BUE support of RW                           ADL either performed or assessed with clinical judgement   ADL Overall ADL's : Needs assistance/impaired Eating/Feeding: Set up;Sitting   Grooming: Sitting;Set up;Min guard Grooming Details (indicate cue type and reason): may need cues for inititation and sequencing? Upper Body Bathing: Minimal assistance;Sitting   Lower Body Bathing: Moderate assistance;Sit to/from stand   Upper Body Dressing : Set up;Min guard;Minimal assistance;Sitting   Lower Body Dressing: Moderate assistance;Sit to/from stand   Toilet Transfer: Moderate assistance;Stand-pivot;BSC;RW   Toileting- Clothing Manipulation and Hygiene: Moderate assistance;Sit to/from stand       Functional mobility during ADLs: Minimal assistance;Rolling walker;Moderate assistance;Cueing for sequencing General ADL Comments: Pt received supine in bed. Pt completed bed mobility, sat EOB a minute then pivotal steps to sit up in  recliner. Difficult to fully assess cognition due to very [facility] but appears to have some cognitive deficits. difficulty sequencing pivotal  steps to recliner, multimodal cueing needed.     Vision         Perception     Praxis      Pertinent Vitals/Pain Pain Assessment: No/denies pain     Hand Dominance Left   Extremity/Trunk Assessment Upper Extremity Assessment Upper Extremity Assessment: Generalized weakness;RUE deficits/detail RUE Deficits / Details: grossly 4/5   Lower Extremity Assessment Lower Extremity Assessment: Defer to PT evaluation       Communication Communication Communication: HOH;Other (comment) (hearing aids out and being charged during OT eval)   Cognition Arousal/Alertness: Awake/alert Behavior During Therapy: Flat affect Overall Cognitive Status: Difficult to assess                                 General Comments: Pt is very HOH. Limited verbalizations, short often one word only responses. Very flat affect. Verbal perseveration? difficulty sequencing and decreased awareness.   General Comments       Exercises     Shoulder Instructions      Home Living Family/patient expects to be discharged to:: Private residence Living Arrangements: Spouse/significant other Available Help at Discharge: Family;Available 24 hours/day Type of Home: House Home Access: Stairs to enter [employer] of Steps: 2 Entrance Stairs-Rails: Right Home Layout: Two level;Able to live on main level with bedroom/bathroom     Bathroom Shower/Tub: Occupational psychologist: Standard     Home Equipment: Environmental consultant - 2 wheels;Bedside commode;Cane - single point          Prior Functioning/Environment Level of Independence: Independent with assistive device(s)        Comments: pt reports ambulating with walker, denies falls other than the 3 recent onces prior to admission        OT Problem List: Decreased strength;Decreased activity tolerance;Impaired balance (sitting and/or standing);Decreased cognition;Decreased knowledge of use of DME or AE;Decreased knowledge of  precautions;Cardiopulmonary status limiting activity;Pain      OT Treatment/Interventions: Self-care/ADL training;Therapeutic exercise;Neuromuscular education;DME and/or AE instruction;Therapeutic activities;Cognitive remediation/compensation;Patient/family education;Balance training    OT Goals(Current goals can be found in the care plan section) Acute Rehab OT Goals Patient Stated Goal: improve balance and strength, regain prior level of mod I with ADLs. goals from conversation with spouse>pt. OT Goal Formulation: With patient/family Time For Goal Achievement: 12/04/20 Potential to Achieve Goals: Good ADL Goals Pt Will Perform Grooming: with set-up;sitting Pt Will Perform Upper Body Dressing: with set-up;sitting Pt Will Perform Lower Body Dressing: with supervision;sit to/from stand Pt Will Transfer to Toilet: with supervision;ambulating Pt Will Perform Toileting - Clothing Manipulation and hygiene: with supervision;sit to/from stand Additional ADL Goal #1: Pt will complete bed mobility at supervision level to prepare for EOB/OOB ADLs.  OT Frequency: Min 2X/week   Barriers to D/C:            Co-evaluation              AM-PAC OT "6 Clicks" Daily Activity     Outcome Measure Help from another person eating meals?: None Help from another person taking care of personal grooming?: A Little Help from another person toileting, which includes using toliet, bedpan, or urinal?: A Lot Help from another person bathing (including washing, rinsing, drying)?: A Lot Help from another person to put on and taking off regular upper body clothing?: A Little  Help from another person to put on and taking off regular lower body clothing?: A Lot 6 Click Score: 16   End of Session Equipment Utilized During Treatment: Rolling walker  Activity Tolerance: Patient limited by fatigue;Patient tolerated treatment well Patient left: in chair;with call bell/phone within reach;with chair alarm set;Other  (comment) (door open)  OT Visit Diagnosis: Unsteadiness on feet (R26.81);History of falling (Z91.81);Muscle weakness (generalized) (M62.81);Other symptoms and signs involving cognitive function;Pain                Time: 6387-5643 OT Time Calculation (min): 20 min Charges:  OT General Charges $OT Visit: 1 Visit OT Evaluation $OT Eval Moderate Complexity: [place], OT Acute Rehabilitation Services Pager: [phone] Office: [phone]   Hortencia Pilar 11/20/2020, 3:11 PM

## 2020-11-20 NOTE — Progress Notes (Signed)
PROGRESS NOTE    Cesar [name]   GHW:[id number]  DOB: 10-25-1928  DOA: 11/19/2020 PCP: Orpah Melter, MD   Brief Narrative:  Cesar [name]  is a 87 y.o. male with medical history significant of permanent A. fib on Eliquis, CKD stage III, CVA, seizure disorder, depression, history of DVT, hyperlipidemia, OSA not on CPAP, AAA, ascending thoracic aortic aneurysm followed by Dr. [name], severe aortic stenosis status post TAVR 10/22/2020, nonobstructive CAD presenting to the ED via EMS for evaluation of weakness and multiple falls.   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  There is associated mass-effect with 5 mm left to right midline shift. The ED physician discussed the case with neurosurgery.  Neurosurgeon recommended to hold Eliquis but not reverse it.  The patient was admitted to the hospital the plan to repeat the CT scan in the morning.  Subjective: No complaints.     Assessment & Plan:   Principal Problem:   SDH (subdural hematoma) - CT today  Reveals the hematoma to be stable in size.  - evaluated by NS today - cont to hold Xarelto for a burr hole procedure tomorrow- Dr [name] still has to speak with the patient's wife - PT recommending CIR  Active Problems:     CKD (chronic kidney disease), stage III  - Cr at baseline at 1.40    Atrial fibrillation  -CHA2DS2-VASc Score at least 3 - Xarelto on hold- follow on telemetry - not on a rate controlling agent  S/p TAVR - holding Xarelto and ASA  BPH - cont Proscar  Severe hearing loss     Time spent in minutes: 35 DVT prophylaxis: SCDs Code Status: Full code Family Communication: wife Level of Care: Level of care: Progressive Disposition Plan:  Status is: Observation  The patient will require care spanning > 2 midnights and should be moved to inpatient because: Inpatient level of care appropriate due to severity of illness  Dispo:  The patient is from: Home              Anticipated d/c is to: TBD              Anticipated d/c date is: > 3 days              Patient currently is not medically stable to d/c.   Difficult to place patient No      Consultants:   NS Procedures:   none Antimicrobials:  Anti-infectives (From admission, onward)   None       Objective: Vitals:   11/20/20 0309 11/20/20 0818 11/20/20 1152 11/20/20 1634  BP: (!) 152/71 (!) 156/73 (!) 153/86 (!) 141/71  Pulse: 69 74 69 71  Resp: 17 20 20 20   Temp: 97.8 F (36.6 C) 98.3 F (36.8 C) 97.6 F (36.4 C) 97.7 F (36.5 C)  TempSrc: Oral Oral Oral Oral  SpO2: 96% 99% 99% 100%    Intake/Output Summary (Last 24 hours) at 11/20/2020 1758 Last data filed at 11/20/2020 0700 Gross per 24 hour  Intake 3 ml  Output 450 ml  Net -447 ml   There were no vitals filed for this visit.  Examination: General exam: Appears comfortable  HEENT: PERRLA, oral mucosa moist, no sclera icterus or thrush Respiratory system: Clear to auscultation. Respiratory effort normal. Cardiovascular system: S1 & S2 heard, RRR.   Gastrointestinal system: Abdomen soft, non-tender, nondistended. Normal bowel sounds. Central nervous system:  Alert and oriented. No focal neurological deficits. Extremities: No cyanosis, clubbing or edema Skin: No rashes or ulcers Psychiatry:  Mood & affect appropriate.     Data Reviewed: I have personally reviewed following labs and imaging studies  CBC: Recent Labs  Lab 11/19/20 1751 11/19/20 1818 11/20/20 0319  WBC 6.8  --  6.6  NEUTROABS 5.1  --   --   HGB 12.3* 12.2* 11.6*  HCT 36.9* 36.0* 35.3*  MCV 97.6  --  97.8  PLT 114*  --  409*   Basic Metabolic Panel: Recent Labs  Lab 11/19/20 1751 11/19/20 1818  NA 137 139  K 4.7 4.8  CL 104 104  CO2 25  --   GLUCOSE 123* 119*  BUN 20 24*  CREATININE 1.34* 1.40*  CALCIUM 8.9  --    GFR: Estimated Creatinine Clearance: 40.4 mL/min (A) (by C-G formula based on SCr  of 1.4 mg/dL (H)). Liver Function Tests: Recent Labs  Lab 11/19/20 1751  AST 28  ALT 22  ALKPHOS 59  BILITOT 0.9  PROT 6.6  ALBUMIN 3.1*   No results for input(s): LIPASE, AMYLASE in the last 168 hours. No results for input(s): AMMONIA in the last 168 hours. Coagulation Profile: Recent Labs  Lab 11/19/20 1751  INR 1.3*   Cardiac Enzymes: Recent Labs  Lab 11/20/20 0319  CKTOTAL 56   BNP (last 3 results) No results for input(s): PROBNP in the last 8760 hours. HbA1C: No results for input(s): HGBA1C in the last 72 hours. CBG: No results for input(s): GLUCAP in the last 168 hours. Lipid Profile: No results for input(s): CHOL, HDL, LDLCALC, TRIG, CHOLHDL, LDLDIRECT in the last 72 hours. Thyroid Function Tests: No results for input(s): TSH, T4TOTAL, FREET4, T3FREE, THYROIDAB in the last 72 hours. Anemia Panel: Recent Labs    11/19/20 2013  VITAMINB12 869   Urine analysis:    Component Value Date/Time   COLORURINE YELLOW 11/20/2020 0500   APPEARANCEUR HAZY (A) 11/20/2020 0500   LABSPEC 1.013 11/20/2020 0500   PHURINE 7.0 11/20/2020 0500   GLUCOSEU NEGATIVE 11/20/2020 0500   HGBUR SMALL (A) 11/20/2020 0500   BILIRUBINUR NEGATIVE 11/20/2020 0500   KETONESUR NEGATIVE 11/20/2020 0500   PROTEINUR NEGATIVE 11/20/2020 0500   UROBILINOGEN 2.0 (H) 01/01/2012 1328   NITRITE POSITIVE (A) 11/20/2020 0500   LEUKOCYTESUR NEGATIVE 11/20/2020 0500   Sepsis Labs: @LABRCNTIP (procalcitonin:4,lacticidven:4) ) Recent Results (from the past 240 hour(s))  SARS CORONAVIRUS 2 (TAT 6-24 HRS) Nasopharyngeal Nasopharyngeal Swab     Status: None   Collection Time: 11/19/20  8:13 PM   Specimen: Nasopharyngeal Swab  Result Value Ref Range Status   SARS Coronavirus 2 NEGATIVE NEGATIVE Final    Comment: (NOTE) SARS-CoV-2 target nucleic acids are NOT DETECTED.  The SARS-CoV-2 RNA is generally detectable in upper and lower respiratory specimens during the acute phase of infection.  Negative results do not preclude SARS-CoV-2 infection, do not rule out co-infections with other pathogens, and should not be used as the sole basis for treatment or other patient management decisions. Negative results must be combined with clinical observations, patient history, and epidemiological information. The expected result is Negative.  Fact Sheet for Patients: SugarRoll.be  Fact Sheet for Healthcare Providers: https://www.woods-mathews.com/  This test is not yet approved or cleared by the [place] FDA and  has been authorized for detection and/or diagnosis of SARS-CoV-2 by FDA under an Emergency Use Authorization (EUA). This EUA will remain  in effect (meaning this test can be used)  for the duration of the COVID-19 declaration under Se ction 564(b)(1) of the Act, 21 U.S.C. section 360bbb-3(b)(1), unless the authorization is terminated or revoked sooner.  Performed at [facility], [place] [address] [place], [place] [address]          Radiology Studies: CT HEAD WO CONTRAST  Result Date: 11/20/2020 CLINICAL DATA:  Follow-up subdural hematoma EXAM: CT HEAD WITHOUT CONTRAST TECHNIQUE: Contiguous axial images were obtained from the base of the skull through the vertex without intravenous contrast. COMPARISON:  Yesterday FINDINGS: Brain: Mixed density subdural hematoma on the left. The high-density components are mainly strandy appearing and favor nonacute blood/septation. Maximal subdural thickness is 3 cm as measured on coronal reformats. Less prominent midline shift due to the degree of atrophy, measuring 7 mm at the septum pellucidum. No entrapment or acute infarct. Chronic small vessel ischemia in the deep cerebral white matter. Chronic encephalomalacia at the anterior right frontal lobe. Vascular: No hyperdense vessel or unexpected calcification. Skull: Normal. Negative for fracture or focal lesion. Sinuses/Orbits: Scleral  buckle on the right.  No acute finding IMPRESSION: Size stable mixed density subdural hematoma measuring 3 cm in thickness along the compressed left frontal convexity. Midline shift measures 7 mm. Electronically Signed   By: Monte Fantasia M.D.   On: 11/20/2020 07:53   CT HEAD WO CONTRAST  Result Date: 11/19/2020 CLINICAL DATA:  Weakness and confusion. EXAM: CT HEAD WITHOUT CONTRAST TECHNIQUE: Contiguous axial images were obtained from the base of the skull through the vertex without intravenous contrast. COMPARISON:  CT head dated 03/15/2019. FINDINGS: Brain: A mixed density subdural fluid collection overlying the left cerebral convexity measures up to 3.0 cm in greatest depth and consistent with an acute on chronic subdural hematoma. There is associated mass effect with 5 mm left to right midline shift. The basilar cisterns are patent. There is no hydrocephalus. Periventricular white matter hypoattenuation likely represents chronic small vessel ischemic disease. Vascular: There are vascular calcifications in the carotid siphons. Skull: Normal. Negative for fracture or focal lesion. Sinuses/Orbits: Left maxillary sinus disease versus mucous retention cysts are noted. Other: None. 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 There is associated mass effect with 5 mm left to right midline shift. These results were called by telephone at the time of interpretation on 11/19/2020 at 7:09 pm to provider [facility] , who verbally acknowledged these results. Electronically Signed   By: Zerita Boers M.D.   On: 11/19/2020 19:13   CT Cervical Spine Wo Contrast  Result Date: 11/19/2020 CLINICAL DATA:  Multiple fall EXAM: CT CERVICAL SPINE WITHOUT CONTRAST TECHNIQUE: Multidetector CT imaging of the cervical spine was performed without intravenous contrast. Multiplanar CT image reconstructions were also generated. COMPARISON:  None. FINDINGS:  Alignment: No subluxation.  Facet alignment is maintained. Skull base and vertebrae: No acute fracture. No primary bone lesion or focal pathologic process. Soft tissues and spinal canal: No prevertebral fluid or swelling. No visible canal hematoma. Disc levels: Multiple level degenerative change with moderate disc space narrowing at C3-C4, C5-C6 and C6-C7. Facet degenerative changes at multiple levels. Upper chest: Emphysema at the apices Other: None IMPRESSION: 1. Degenerative changes of the cervical spine. No acute osseous abnormality. 2. Emphysema at the apices. Emphysema (ICD10-J43.9). Electronically Signed   By: Donavan Foil M.D.   On: 11/19/2020 21:01   DG CHEST PORT 1 VIEW  Result Date: 11/19/2020 CLINICAL DATA:  Status post fall.  Weakness. EXAM:  PORTABLE CHEST 1 VIEW COMPARISON:  None. FINDINGS: The lungs are hyperinflated. Mild, diffuse, chronic appearing increased lung markings are seen. There is no evidence of acute infiltrate, pleural effusion or pneumothorax. The cardiac silhouette is moderately enlarged. An artificial aortic valve is present. Marked severity calcification of the aortic arch is seen. Multilevel degenerative changes seen throughout the thoracic spine. IMPRESSION: Chronic appearing increased lung markings without evidence of acute or active cardiopulmonary disease. Electronically Signed   By: Virgina Norfolk M.D.   On: 11/19/2020 23:49      Scheduled Meds: . divalproex  1,000 mg Oral Daily   Continuous Infusions:   LOS: 0 days      Debbe Odea, MD [facility] Pager: [email or web address] 11/20/2020, 5:58 PM

## 2020-11-20 NOTE — Plan of Care (Signed)

## 2020-11-20 NOTE — Evaluation (Signed)
Physical Therapy Evaluation Patient Details Name: Cesar [name] MRN: [id number] DOB: Feb 14, 1929 Today's Date: 11/20/2020   History of Present Illness  87 y.o. male with medical history significant of permanent A. fib on Eliquis, CKD stage III, CVA, seizure disorder, depression, history of DVT, hyperlipidemia, OSA not on CPAP, AAA, ascending thoracic aortic aneurysm followed by Dr. [name], severe aortic stenosis status post TAVR 10/22/2020, nonobstructive CAD presenting to the ED via EMS for evaluation of weakness and multiple falls. Head CT showing acute on chronic left cerebral convexity subdural hematoma.  Clinical Impression  Pt presents to PT with deficits in gait, balance, functional mobility, strength, power, communication, endurance, coordination. Pt is very hard of hearing which limits assessment due to inconsistent command following. Pt is able to follow nonverbal cues more consistently during evaluation. Pt demonstrates R sided weakness and some impairments in coordination of R side during mobility which results in physical assistance requirements for all mobility. Pt will benefit from aggressive mobilization and acute PT POC to improve mobility quality and to reduce falls risk. PT recommends CIR placement to improve gait quality and to reduce falls risk as the pt was mobilizing at a modI level for household distances prior to this admission.    Follow Up Recommendations CIR;Supervision for mobility/OOB    Equipment Recommendations  Wheelchair (measurements PT) (if D/C home today)    Recommendations for Other Services       Precautions / Restrictions Precautions Precautions: Fall Restrictions Weight Bearing Restrictions: No      Mobility  Bed Mobility Overal bed mobility: Needs Assistance Bed Mobility: Supine to Sit     Supine to sit: Min assist;HOB elevated          Transfers Overall transfer level: Needs assistance Equipment used: Rolling walker (2  wheeled) Transfers: Sit to/from Stand Sit to Stand: Min assist         General transfer comment: pt insists on PT holding RW to stabilize device so pt can pull up on RW. PT attempts to provide cues to push from recliner however pt insists on PT holding RW  Ambulation/Gait Ambulation/Gait assistance: Min assist;Mod assist Gait Distance (Feet): 4 Feet Assistive device: Rolling walker (2 wheeled) Gait Pattern/deviations: Step-to pattern;Ataxic Gait velocity: reduced Gait velocity interpretation: <1.31 ft/sec, indicative of household ambulator General Gait Details: pt requiring minA with forward walking, modA with backward steps. Pt consistently attempts to sit prior to reaching recliner. Pt with inconsistent step placement and stance width with RLE  Stairs            Wheelchair Mobility    Modified Rankin (Stroke Patients Only) Modified Rankin (Stroke Patients Only) Pre-Morbid Rankin Score: Moderate disability Modified Rankin: Moderately severe disability     Balance Overall balance assessment: Needs assistance Sitting-balance support: No upper extremity supported;Feet supported Sitting balance-Leahy Scale: Fair     Standing balance support: Bilateral upper extremity supported Standing balance-Leahy Scale: Poor Standing balance comment: reliant on BUE support of RW                             Pertinent Vitals/Pain Pain Assessment: No/denies pain    Home Living Family/patient expects to be discharged to:: Private residence Living Arrangements: Spouse/significant other Available Help at Discharge: Family;Available 24 hours/day Type of Home: House Home Access: Stairs to enter Entrance Stairs-Rails: Right Entrance Stairs-Number of Steps: 2 Home Layout: Two level;Able to live on main level with bedroom/bathroom Home Equipment: Gilford Rile - 2 wheels;Bedside  commode;Cane - single point      Prior Function Level of Independence: Independent with assistive  device(s)         Comments: pt reports ambulating with walker, denies falls other than the 3 recent onces prior to admission     Hand Dominance   Dominant Hand: Left    Extremity/Trunk Assessment   Upper Extremity Assessment Upper Extremity Assessment: RUE deficits/detail RUE Deficits / Details: grossly 4/5    Lower Extremity Assessment Lower Extremity Assessment: RLE deficits/detail RLE Deficits / Details: grossly 4-/5, some ataxia noted in gait    Cervical / Trunk Assessment Cervical / Trunk Assessment: Normal  Communication   Communication: HOH (hearing aides in, possible battery dead)  Cognition Arousal/Alertness: Awake/alert Behavior During Therapy: Flat affect Overall Cognitive Status: Difficult to assess                                 General Comments: pt is very hard of hearing. Pt is oriented to person, place, and partially to situation. Pt demonstrates slowed processing and some inconsistencies in command following, however this may be due to impaired hearing      General Comments General comments (skin integrity, edema, etc.): VSS on RA    Exercises     Assessment/Plan    PT Assessment Patient needs continued PT services  PT Problem List Decreased strength;Decreased activity tolerance;Decreased balance;Decreased mobility;Decreased knowledge of use of DME;Decreased safety awareness;Decreased knowledge of precautions       PT Treatment Interventions DME instruction;Gait training;Stair training;Functional mobility training;Therapeutic activities;Therapeutic exercise;Balance training;Neuromuscular re-education;Cognitive remediation;Patient/family education    PT Goals (Current goals can be found in the Care Plan section)  Acute Rehab PT Goals Patient Stated Goal: pt current goal to eat (NPO), PT goal to improve gait and balance in an effort to reduce falls risk PT Goal Formulation: With patient Time For Goal Achievement:  12/04/20 Potential to Achieve Goals: Good    Frequency Min 3X/week   Barriers to discharge        Co-evaluation               AM-PAC PT "6 Clicks" Mobility  Outcome Measure Help needed turning from your back to your side while in a flat bed without using bedrails?: A Little Help needed moving from lying on your back to sitting on the side of a flat bed without using bedrails?: A Little Help needed moving to and from a bed to a chair (including a wheelchair)?: A Little Help needed standing up from a chair using your arms (e.g., wheelchair or bedside chair)?: A Little Help needed to walk in hospital room?: A Lot Help needed climbing 3-5 steps with a railing? : A Lot 6 Click Score: 16    End of Session   Activity Tolerance: Patient tolerated treatment well Patient left: in chair;with call bell/phone within reach;with chair alarm set Nurse Communication: Mobility status PT Visit Diagnosis: Unsteadiness on feet (R26.81);Other abnormalities of gait and mobility (R26.89);Muscle weakness (generalized) (M62.81);Other symptoms and signs involving the nervous system (R29.898)    Time: 2542-7062 PT Time Calculation (min) (ACUTE ONLY): 23 min   Charges:   PT Evaluation $PT Eval Moderate Complexity: 1 Mod PT Treatments $Therapeutic Activity: 8-22 mins        Zenaida Niece, PT, DPT Acute Rehabilitation Pager: [phone]   Zenaida Niece 11/20/2020, 9:08 AM

## 2020-11-21 ENCOUNTER — Encounter (HOSPITAL_COMMUNITY): Payer: Self-pay | Admitting: Internal Medicine

## 2020-11-21 ENCOUNTER — Inpatient Hospital Stay (HOSPITAL_COMMUNITY): Payer: Medicare Other | Admitting: Certified Registered"

## 2020-11-21 ENCOUNTER — Encounter (HOSPITAL_COMMUNITY)
Admission: EM | Disposition: A | Discharge status: Rehabilitation Facility | Payer: Self-pay | Source: Home / Self Care | Attending: Neurosurgery

## 2020-11-21 DIAGNOSIS — I6200 Nontraumatic subdural hemorrhage, unspecified: Secondary | ICD-10-CM

## 2020-11-21 DIAGNOSIS — N1831 Chronic kidney disease, stage 3a: Secondary | ICD-10-CM | POA: Diagnosis not present

## 2020-11-21 DIAGNOSIS — R296 Repeated falls: Secondary | ICD-10-CM | POA: Diagnosis not present

## 2020-11-21 DIAGNOSIS — I4891 Unspecified atrial fibrillation: Secondary | ICD-10-CM | POA: Diagnosis not present

## 2020-11-21 HISTORY — PX: BURR HOLE: SHX908

## 2020-11-21 LAB — GLUCOSE, CAPILLARY
Glucose-Capillary: 93 mg/dL (ref 70–99)
Glucose-Capillary: 98 mg/dL (ref 70–99)
Glucose-Capillary: 123 mg/dL — ABNORMAL HIGH (ref 70–99)

## 2020-11-21 SURGERY — CREATION, CRANIAL BURR HOLE
Anesthesia: General | Site: Head | Laterality: Left

## 2020-11-21 MED ORDER — THROMBIN 5000 UNITS EX SOLR
CUTANEOUS | Status: AC
  Filled 2020-11-21: qty 10000

## 2020-11-21 MED ORDER — DEXAMETHASONE SODIUM PHOSPHATE 10 MG/ML IJ SOLN
INTRAMUSCULAR | Status: AC
  Filled 2020-11-21: qty 1

## 2020-11-21 MED ORDER — ORAL CARE MOUTH RINSE: 15.0000 mL | Freq: Once | OROMUCOSAL | Status: DC

## 2020-11-21 MED ORDER — ROCURONIUM 10MG/ML (10ML) SYRINGE FOR MEDFUSION PUMP - OPTIME
INTRAVENOUS | Status: DC | PRN
  Administered 2020-11-21 (×2): 50 mg via INTRAVENOUS

## 2020-11-21 MED ORDER — CHLORHEXIDINE GLUCONATE 0.12 % MT SOLN
15.0000 mL | Freq: Once | OROMUCOSAL | Status: DC
  Filled 2020-11-21: qty 15

## 2020-11-21 MED ORDER — CEFAZOLIN SODIUM-DEXTROSE 2-3 GM-%(50ML) IV SOLR
INTRAVENOUS | Status: DC | PRN
  Administered 2020-11-21: 2 g via INTRAVENOUS

## 2020-11-21 MED ORDER — PHENYLEPHRINE HCL-NACL 10-0.9 MG/250ML-% IV SOLN
INTRAVENOUS | Status: DC | PRN
  Administered 2020-11-21: 50 ug/min via INTRAVENOUS

## 2020-11-21 MED ORDER — ENALAPRILAT 1.25 MG/ML IV SOLN: 1.2500 mg | Freq: Four times a day (QID) | INTRAVENOUS | Status: DC | PRN

## 2020-11-21 MED ORDER — PROPOFOL 10 MG/ML IV BOLUS
INTRAVENOUS | Status: DC | PRN
  Administered 2020-11-21: 150 mg via INTRAVENOUS

## 2020-11-21 MED ORDER — ONDANSETRON HCL 4 MG/2ML IJ SOLN: 4.0000 mg | Freq: Four times a day (QID) | INTRAMUSCULAR | Status: DC | PRN

## 2020-11-21 MED ORDER — SUGAMMADEX SODIUM 200 MG/2ML IV SOLN
INTRAVENOUS | Status: DC | PRN
  Administered 2020-11-21: 200 mg via INTRAVENOUS

## 2020-11-21 MED ORDER — CEFAZOLIN SODIUM 1 G IJ SOLR
INTRAMUSCULAR | Status: AC
  Filled 2020-11-21: qty 50

## 2020-11-21 MED ORDER — FLEET ENEMA 7-19 GM/118ML RE ENEM: 1.0000 | ENEMA | Freq: Once | RECTAL | Status: DC | PRN

## 2020-11-21 MED ORDER — ENALAPRILAT 1.25 MG/ML IV SOLN
1.2500 mg | Freq: Four times a day (QID) | INTRAVENOUS | Status: DC | PRN
  Administered 2020-11-23: 1.25 mg via INTRAVENOUS
  Filled 2020-11-21 (×2): qty 1

## 2020-11-21 MED ORDER — ALBUMIN HUMAN 5 % IV SOLN: INTRAVENOUS | Status: DC | PRN

## 2020-11-21 MED ORDER — THROMBIN 20000 UNITS EX SOLR
CUTANEOUS | Status: AC
  Filled 2020-11-21: qty 20000

## 2020-11-21 MED ORDER — SODIUM CHLORIDE 0.9 % IV SOLN: INTRAVENOUS | Status: DC

## 2020-11-21 MED ORDER — SUFENTANIL CITRATE 50 MCG/ML IV SOLN
INTRAVENOUS | Status: AC
  Filled 2020-11-21: qty 1

## 2020-11-21 MED ORDER — THROMBIN 20000 UNITS EX SOLR: CUTANEOUS | Status: DC | PRN

## 2020-11-21 MED ORDER — SODIUM CHLORIDE 0.9 % IV SOLN: INTRAVENOUS | Status: DC | PRN

## 2020-11-21 MED ORDER — SODIUM CHLORIDE 0.9 % IV SOLN
1250.0000 mg | INTRAVENOUS | Status: AC
  Administered 2020-11-21: 1250 mg via INTRAVENOUS
  Filled 2020-11-21: qty 25

## 2020-11-21 MED ORDER — FENTANYL CITRATE (PF) 100 MCG/2ML IJ SOLN: 25.0000 ug | INTRAMUSCULAR | Status: DC | PRN

## 2020-11-21 MED ORDER — LIDOCAINE HCL (CARDIAC) PF 100 MG/5ML IV SOSY
PREFILLED_SYRINGE | INTRAVENOUS | Status: DC | PRN
  Administered 2020-11-21: 40 mg via INTRATRACHEAL

## 2020-11-21 MED ORDER — PROMETHAZINE HCL 25 MG PO TABS: 12.5000 mg | ORAL_TABLET | ORAL | Status: DC | PRN

## 2020-11-21 MED ORDER — MORPHINE SULFATE (PF) 2 MG/ML IV SOLN
2.0000 mg | INTRAVENOUS | Status: DC | PRN
Start: 2020-11-21 — End: 2020-12-02

## 2020-11-21 MED ORDER — DOCUSATE SODIUM 100 MG PO CAPS
100.0000 mg | ORAL_CAPSULE | Freq: Two times a day (BID) | ORAL | Status: DC
  Administered 2020-11-22 – 2020-12-02 (×17): 100 mg via ORAL
  Filled 2020-11-21 (×17): qty 1

## 2020-11-21 MED ORDER — THROMBIN 5000 UNITS EX SOLR
CUTANEOUS | Status: AC
  Filled 2020-11-21: qty 5000

## 2020-11-21 MED ORDER — ONDANSETRON HCL 4 MG/2ML IJ SOLN
INTRAMUSCULAR | Status: DC | PRN
  Administered 2020-11-21: 4 mg via INTRAVENOUS

## 2020-11-21 MED ORDER — SUFENTANIL CITRATE 50 MCG/ML IV SOLN
INTRAVENOUS | Status: DC | PRN
  Administered 2020-11-21: 5 ug via INTRAVENOUS
  Administered 2020-11-21: 15 ug via INTRAVENOUS

## 2020-11-21 MED ORDER — THROMBIN 5000 UNITS EX SOLR: OROMUCOSAL | Status: DC | PRN

## 2020-11-21 MED ORDER — CEFAZOLIN SODIUM-DEXTROSE 1-4 GM/50ML-% IV SOLN
1.0000 g | Freq: Three times a day (TID) | INTRAVENOUS | Status: AC
  Administered 2020-11-21 – 2020-11-23 (×6): 1 g via INTRAVENOUS
  Filled 2020-11-21 (×6): qty 50

## 2020-11-21 MED ORDER — LIDOCAINE-EPINEPHRINE 1 %-1:100000 IJ SOLN
INTRAMUSCULAR | Status: AC
  Filled 2020-11-21: qty 1

## 2020-11-21 MED ORDER — ONDANSETRON HCL 4 MG PO TABS: 4.0000 mg | ORAL_TABLET | ORAL | Status: DC | PRN

## 2020-11-21 MED ORDER — LIDOCAINE-EPINEPHRINE 1 %-1:100000 IJ SOLN
INTRAMUSCULAR | Status: DC | PRN
  Administered 2020-11-21: 10 mL

## 2020-11-21 MED ORDER — ONDANSETRON HCL 4 MG/2ML IJ SOLN: 4.0000 mg | INTRAMUSCULAR | Status: DC | PRN

## 2020-11-21 MED ORDER — ONDANSETRON HCL 4 MG/2ML IJ SOLN
INTRAMUSCULAR | Status: AC
  Filled 2020-11-21: qty 2

## 2020-11-21 MED ORDER — LABETALOL HCL 5 MG/ML IV SOLN
10.0000 mg | INTRAVENOUS | Status: DC | PRN
  Administered 2020-11-23: 10 mg via INTRAVENOUS
  Filled 2020-11-21 (×2): qty 4

## 2020-11-21 MED ORDER — NICARDIPINE HCL IN NACL 20-0.86 MG/200ML-% IV SOLN
3.0000 mg/h | INTRAVENOUS | Status: DC
  Filled 2020-11-21: qty 200

## 2020-11-21 MED ORDER — OXYCODONE HCL 5 MG PO TABS: 5.0000 mg | ORAL_TABLET | Freq: Once | ORAL | Status: DC | PRN

## 2020-11-21 MED ORDER — CHLORHEXIDINE GLUCONATE CLOTH 2 % EX PADS
6.0000 | MEDICATED_PAD | Freq: Every day | CUTANEOUS | Status: DC
  Administered 2020-11-22 – 2020-11-29 (×8): 6 via TOPICAL

## 2020-11-21 MED ORDER — POLYETHYLENE GLYCOL 3350 17 G PO PACK
17.0000 g | PACK | Freq: Every day | ORAL | Status: DC | PRN
  Administered 2020-11-25 – 2020-11-30 (×2): 17 g via ORAL
  Filled 2020-11-21 (×2): qty 1

## 2020-11-21 MED ORDER — PHENYLEPHRINE 40 MCG/ML (10ML) SYRINGE FOR IV PUSH (FOR BLOOD PRESSURE SUPPORT)
PREFILLED_SYRINGE | INTRAVENOUS | Status: AC
  Filled 2020-11-21: qty 10

## 2020-11-21 MED ORDER — DIVALPROEX SODIUM ER 500 MG PO TB24
500.0000 mg | ORAL_TABLET | Freq: Every day | ORAL | Status: DC
  Administered 2020-11-22 – 2020-11-24 (×3): 500 mg via ORAL
  Filled 2020-11-21 (×4): qty 1

## 2020-11-21 MED ORDER — HEMOSTATIC AGENTS (NO CHARGE) OPTIME
TOPICAL | Status: DC | PRN
  Administered 2020-11-21: 1 via TOPICAL

## 2020-11-21 MED ORDER — 0.9 % SODIUM CHLORIDE (POUR BTL) OPTIME
TOPICAL | Status: DC | PRN
  Administered 2020-11-21 (×2): 1000 mL

## 2020-11-21 MED ORDER — PHENYLEPHRINE HCL (PRESSORS) 10 MG/ML IV SOLN
INTRAVENOUS | Status: DC | PRN
  Administered 2020-11-21: 80 ug via INTRAVENOUS

## 2020-11-21 MED ORDER — PANTOPRAZOLE SODIUM 40 MG IV SOLR
40.0000 mg | Freq: Every day | INTRAVENOUS | Status: DC
  Administered 2020-11-21 – 2020-11-22 (×2): 40 mg via INTRAVENOUS
  Filled 2020-11-21 (×2): qty 40

## 2020-11-21 MED ORDER — OXYCODONE HCL 5 MG/5ML PO SOLN: 5.0000 mg | Freq: Once | ORAL | Status: DC | PRN

## 2020-11-21 MED ORDER — DEXAMETHASONE SODIUM PHOSPHATE 10 MG/ML IJ SOLN
INTRAMUSCULAR | Status: DC | PRN
  Administered 2020-11-21: 10 mg via INTRAVENOUS

## 2020-11-21 MED ORDER — PROPOFOL 10 MG/ML IV BOLUS
INTRAVENOUS | Status: AC
  Filled 2020-11-21: qty 20

## 2020-11-21 SURGICAL SUPPLY — 80 items
APL SKNCLS STERI-STRIP NONHPOA (GAUZE/BANDAGES/DRESSINGS) ×1
BENZOIN TINCTURE PRP APPL 2/3 (GAUZE/BANDAGES/DRESSINGS) ×3 IMPLANT
BLADE CLIPPER SURG (BLADE) ×3 IMPLANT
BNDG GAUZE ELAST 4 BULKY (GAUZE/BANDAGES/DRESSINGS) IMPLANT
BRR ADH 6X5 SEPRAFILM 1 SHT (MISCELLANEOUS)
BUR ACORN 9.0 PRECISION (BURR) ×2 IMPLANT
BUR ACORN 9.0MM PRECISION (BURR) ×1
BUR SPIRAL ROUTER 2.3 (BUR) IMPLANT
BUR SPIRAL ROUTER 2.3MM (BUR)
CANISTER SUCT 3000ML PPV (MISCELLANEOUS) ×3 IMPLANT
CARTRIDGE OIL MAESTRO DRILL (MISCELLANEOUS) IMPLANT
CATH VENTRIC 35X38 W/TROCAR LG (CATHETERS) ×3 IMPLANT
CLIP VESOCCLUDE MED 6/CT (CLIP) IMPLANT
CLOSURE WOUND 1/2 X4 (GAUZE/BANDAGES/DRESSINGS)
COVER WAND RF STERILE (DRAPES) ×3 IMPLANT
DIFFUSER DRILL AIR PNEUMATIC (MISCELLANEOUS) ×3 IMPLANT
DRAPE MICROSCOPE LEICA (MISCELLANEOUS) IMPLANT
DRAPE NEUROLOGICAL W/INCISE (DRAPES) ×3 IMPLANT
DRAPE SHEET LG 3/4 BI-LAMINATE (DRAPES) ×3 IMPLANT
DRAPE WARM FLUID 44X44 (DRAPES) ×3 IMPLANT
DRSG AQUACEL AG ADV 3.5X 4 (GAUZE/BANDAGES/DRESSINGS) ×3 IMPLANT
DRSG MEPILEX BORDER 4X4 (GAUZE/BANDAGES/DRESSINGS) IMPLANT
DRSG XEROFORM 1X8 (GAUZE/BANDAGES/DRESSINGS) IMPLANT
DURAPREP 26ML APPLICATOR (WOUND CARE) ×3 IMPLANT
ELECT COATED BLADE 2.86 ST (ELECTRODE) ×3 IMPLANT
ELECT REM PT RETURN 9FT ADLT (ELECTROSURGICAL) ×3
ELECTRODE REM PT RTRN 9FT ADLT (ELECTROSURGICAL) ×1 IMPLANT
EVACUATOR 1/8 PVC DRAIN (DRAIN) IMPLANT
EVACUATOR SILICONE 100CC (DRAIN) IMPLANT
FORCEPS BIPOLAR SPETZLER 8 1.0 (NEUROSURGERY SUPPLIES) ×3 IMPLANT
GAUZE 4X4 16PLY RFD (DISPOSABLE) IMPLANT
GAUZE SPONGE 4X4 12PLY STRL (GAUZE/BANDAGES/DRESSINGS) ×3 IMPLANT
GLOVE BIOGEL PI IND STRL 7.5 (GLOVE) ×4 IMPLANT
GLOVE BIOGEL PI INDICATOR 7.5 (GLOVE) ×8
GLOVE ECLIPSE 7.5 STRL STRAW (GLOVE) ×3 IMPLANT
GLOVE ECLIPSE 8.0 STRL XLNG CF (GLOVE) ×3 IMPLANT
GLOVE SRG 8 PF TXTR STRL LF DI (GLOVE) ×1 IMPLANT
GLOVE SURG UNDER POLY LF SZ8 (GLOVE) ×3
GOWN STRL REUS W/ TWL LRG LVL3 (GOWN DISPOSABLE) ×2 IMPLANT
GOWN STRL REUS W/ TWL XL LVL3 (GOWN DISPOSABLE) ×2 IMPLANT
GOWN STRL REUS W/TWL 2XL LVL3 (GOWN DISPOSABLE) IMPLANT
GOWN STRL REUS W/TWL LRG LVL3 (GOWN DISPOSABLE) ×6
GOWN STRL REUS W/TWL XL LVL3 (GOWN DISPOSABLE) ×6
HEMOSTAT POWDER KIT SURGIFOAM (HEMOSTASIS) ×3 IMPLANT
HEMOSTAT SURGICEL 2X14 (HEMOSTASIS) ×3 IMPLANT
HOOK RETRACTION 12 ELAST STAY (MISCELLANEOUS) IMPLANT
KIT BASIN OR (CUSTOM PROCEDURE TRAY) ×3 IMPLANT
KIT DRAIN CSF ACCUDRAIN (MISCELLANEOUS) ×3 IMPLANT
KIT TURNOVER KIT B (KITS) ×3 IMPLANT
MARKER SKIN DUAL TIP RULER LAB (MISCELLANEOUS) ×3 IMPLANT
NEEDLE HYPO 22GX1.5 SAFETY (NEEDLE) ×3 IMPLANT
NS IRRIG 1000ML POUR BTL (IV SOLUTION) ×9 IMPLANT
OIL CARTRIDGE MAESTRO DRILL (MISCELLANEOUS)
PACK CRANIOTOMY CUSTOM (CUSTOM PROCEDURE TRAY) ×3 IMPLANT
PATTIES SURGICAL .5 X.5 (GAUZE/BANDAGES/DRESSINGS) IMPLANT
PATTIES SURGICAL .5 X3 (DISPOSABLE) IMPLANT
PATTIES SURGICAL 1X1 (DISPOSABLE) IMPLANT
SEPRAFILM MEMBRANE 5X6 (MISCELLANEOUS) IMPLANT
SPONGE LAP 18X18 RF (DISPOSABLE) ×3 IMPLANT
SPONGE NEURO XRAY DETECT 1X3 (DISPOSABLE) IMPLANT
SPONGE SURGIFOAM ABS GEL 100 (HEMOSTASIS) ×3 IMPLANT
STAPLER VISISTAT 35W (STAPLE) ×3 IMPLANT
STOCKINETTE 6  STRL (DRAPES)
STOCKINETTE 6 STRL (DRAPES) IMPLANT
STRIP CLOSURE SKIN 1/2X4 (GAUZE/BANDAGES/DRESSINGS) IMPLANT
SUT ETHILON 3 0 FSL (SUTURE) IMPLANT
SUT ETHILON 3 0 PS 1 (SUTURE) ×3 IMPLANT
SUT MON AB 3-0 SH 27 (SUTURE)
SUT MON AB 3-0 SH27 (SUTURE) IMPLANT
SUT NURALON 4 0 TR CR/8 (SUTURE) IMPLANT
SUT VIC AB 2-0 CP2 18 (SUTURE) ×3 IMPLANT
SUT VIC AB 3-0 SH 8-18 (SUTURE) IMPLANT
TAPE PAPER 1X10 WHT MICROPORE (GAUZE/BANDAGES/DRESSINGS) IMPLANT
TAPE PAPER 2X10 WHT MICROPORE (GAUZE/BANDAGES/DRESSINGS) IMPLANT
TOWEL GREEN STERILE (TOWEL DISPOSABLE) ×3 IMPLANT
TOWEL GREEN STERILE FF (TOWEL DISPOSABLE) ×3 IMPLANT
TRAY FOLEY MTR SLVR 16FR STAT (SET/KITS/TRAYS/PACK) ×3 IMPLANT
TUBE CONNECTING 20'X1/4 (TUBING) ×1
TUBE CONNECTING 20X1/4 (TUBING) ×2 IMPLANT
WATER STERILE IRR 1000ML POUR (IV SOLUTION) ×3 IMPLANT

## 2020-11-21 NOTE — Op Note (Signed)
Procedure(s): [employer] Procedure Note    Procedure(s) and Anesthesia Type:    Left sided burr hole x 2 and placement of subdural drain for evacuation of subdural hematoma  Surgeon(s) and Role:    [name], Dorcas Carrow, MD - Primary    Dawley, Pieter Partridge DO - Assisting   Indications: This is a 87 year old man with coronary artery disease, atrial fibrillation and recent TAVR who was on Xarelto and aspirin and developed progressive confusion, difficulty with speaking and ambulating, and right-sided weakness.  He was found to have a loculated large left subdural hematoma.  He was admitted to the hospital and his anticoagulation was held.  I had a long discussion with the patient's wife and daughter regarding treatment options.  Given the size of his subdural hematoma, functional recovery was unlikely without surgery.  However, there are certainly would be risk of prolonged recovery or incomplete recovery even with surgery, as well as significant risk of recurrence, or thromboembolic complications while his anticoagulation is held.  They wished to proceed with aggressive treatment.  Risks, benefits, alternatives, and expected convalescence were discussed with his wife and daughter.  Risks discussed included but were not limited to bleeding, pain, infection, seizure, stroke, scar, subdural recurrence, neurologic deficit, coma, and death.  Informed consent was obtained.  We planned for surgery 48 hours after his anticoagulation was held.  Surgeon: [name]   Assistants: Elwin Sleight, DO.  He provided critical assistance with dissection of the subdural membranes off the surface of the brain.  No qualified trainees were available to assist with the procedure.  Anesthesia: General endotracheal anesthesia   Procedure in detail:  The patient was brought to the operating room.  A timeout was performed.  General anesthesia was induced and patient was intubated by the anesthesia service.  After  appropriate lines and monitors were placed, patient's head was turned and the left scalp was shaved, preprepped with alcohol and cleansing solution and prepped and draped in sterile fashion.  1% lidocaine with epinephrine injected into the planned incisions.  2 incisions were made with a 10 blade just above the superior temporal line, one in the frontal region and one in the parietal region.  The periosteum was swept off the skull and self-retaining retractors were placed.  Perforator was used to prep performed bur holes at each incision.  The dura was then coagulated and opened in cruciate manner.  Superficial membrane was coagulated and opened at each bur hole.  The posterior bur hole required dissection through to membranes, while the anterior frontal bur hole required dissection through 4 separate membranes before reaching the brain surface.  Chronic subdural fluid was encountered.  The subdural space was irrigated thoroughly with good communication between the openings and the irrigation returned clear.  The brain was inspected and appeared grossly normal.  However, as it had not fully returned from its sunken status, an EVD catheter was used as a subdural drain and placed in the posterior bur hole and tunneled out the skin and secured with a stitch.  The dural defects were covered with Gelfoam and the subdural space was irrigated one last time.  The incisions were closed with 2-0 Vicryl stitches and staples.  The subdural drain was connected to a drainage bag.    Sterile dressing was then placed on the incision.  Patient was then extubated by the anesthesia service moving all extremities.  All counts were correct at the end of surgery.  No complications were noted.  Findings: Large chronic subdural hematoma with loculations  Estimated Blood Loss:  < 10 ml         Drains: subdural drain        Specimens: none         Implants: none        Complications:  none         Disposition: To PACU          Condition: stable

## 2020-11-21 NOTE — Transfer of Care (Signed)
Immediate Anesthesia Transfer of Care Note  Patient: Cesar [name]  Procedure(s) Performed: LEFT Side BURR HOLES, (Left Head)  Patient Location: PACU  Anesthesia Type:General  Level of Consciousness: sedated  Airway & Oxygen Therapy: Patient connected to face mask oxygen  Post-op Assessment: Report given to RN and Post -op Vital signs reviewed and stable  Post vital signs: Reviewed and stable  Last Vitals:  Vitals Value Taken Time  BP 112/69 11/21/20 1701  Temp    Pulse 63 11/21/20 1705  Resp 21 11/21/20 1705  SpO2 100 % 11/21/20 1705  Vitals shown include unvalidated device data.  Last Pain:  Vitals:   11/21/20 1215  TempSrc: Oral  PainSc:          Complications: No complications documented.

## 2020-11-21 NOTE — Anesthesia Preprocedure Evaluation (Addendum)
Anesthesia Evaluation  Patient identified by MRN, date of birth, ID band Patient confused    Reviewed: Allergy & Precautions, H&P , NPO status , Patient's Chart, lab work & pertinent test results  Airway Mallampati: II   Neck ROM: full    Dental   Pulmonary sleep apnea , former smoker,    breath sounds clear to auscultation       Cardiovascular + Peripheral Vascular Disease  + Valvular Problems/Murmurs  Rhythm:regular Rate:Normal  S/p TAVR 10/2020. AAA   Neuro/Psych PSYCHIATRIC DISORDERS Depression CVA    GI/Hepatic   Endo/Other    Renal/GU Renal InsufficiencyRenal disease     Musculoskeletal   Abdominal   Peds  Hematology   Anesthesia Other Findings   Reproductive/Obstetrics                           Anesthesia Physical Anesthesia Plan  ASA: III  Anesthesia Plan: General   Post-op Pain Management:    Induction: Intravenous  PONV Risk Score and Plan: 2 and Ondansetron, Dexamethasone and Treatment may vary due to age or medical condition  Airway Management Planned: Oral ETT  Additional Equipment:   Intra-op Plan:   Post-operative Plan: Extubation in OR  Informed Consent: I have reviewed the patients History and Physical, chart, labs and discussed the procedure including the risks, benefits and alternatives for the proposed anesthesia with the patient or authorized representative who has indicated his/her understanding and acceptance.     Dental advisory given  Plan Discussed with: CRNA, Anesthesiologist and Surgeon  Anesthesia Plan Comments:         Anesthesia Quick Evaluation

## 2020-11-21 NOTE — Progress Notes (Signed)
Physical Therapy Treatment Patient Details Name: Cesar [name] MRN: [id number] DOB: [birth year] Today's Date: 11/21/2020    History of Present Illness 87 y.o. male with medical history significant of permanent A. fib on Eliquis, CKD stage III, CVA, seizure disorder, depression, history of DVT, hyperlipidemia, OSA not on CPAP, AAA, ascending thoracic aortic aneurysm followed by Dr. [name], severe aortic stenosis status post TAVR 10/22/2020, nonobstructive CAD presenting to the ED via EMS for evaluation of weakness and multiple falls. Head CT showing acute on chronic left cerebral convexity subdural hematoma.    PT Comments    Pt tolerates treatment well but demonstrates increased assistance requirements in transfers and with ambulation. Pt with impaired motor control of RLE and some R inattention noted during session. Pt requires assistance with weight shifting and physical assist to advance RLE for most steps when ambulating. Pt will benefit from continued aggressive mobilization and acute PT services to improve mobility quality and to reduce falls risk. PT continues to recommend CIR placement at this time.   Follow Up Recommendations  CIR;Supervision for mobility/OOB     Equipment Recommendations  Wheelchair (measurements PT);Wheelchair cushion (measurements PT);Hospital bed (mechanical lift, all if home today)    Recommendations for Other Services       Precautions / Restrictions Precautions Precautions: Fall Precaution Comments: R inattention, hearing aides Restrictions Weight Bearing Restrictions: No    Mobility  Bed Mobility Overal bed mobility: Needs Assistance Bed Mobility: Supine to Sit     Supine to sit: Mod assist;HOB elevated     General bed mobility comments: pt with difficulty mobilizing RLE off bed, with RLE positioned in flexion and requiring PT assist to extend off bed. Pt also requires physical assistance to pull trunk into sitting position     Transfers Overall transfer level: Needs assistance Equipment used: Rolling walker (2 wheeled) Transfers: Sit to/from [employer] Sit to Stand: Mod assist Stand pivot transfers: Max assist       General transfer comment: per spouse pt performs sit to stands with spouse stabilizing RW and pt pulling on RW with BUEs. Pt removing hands from RW to reach for recliner during SPT, initiates descent to chair early and requires PT assistance to facilitate rotation of hips to land in recliner  Ambulation/Gait Ambulation/Gait assistance: Max assist Gait Distance (Feet): 20 Feet Assistive device: Rolling walker (2 wheeled) Gait Pattern/deviations: Step-to pattern;Decreased step length - right;Decreased weight shift to left;Decreased dorsiflexion - right Gait velocity: reduced Gait velocity interpretation: <1.31 ft/sec, indicative of household ambulator General Gait Details: pt with reduced dorsiflexion of RLE and reduced weight shift to left. PT provides physical assistance to aide in shifting weight left, as well as physical assistance to advance RLE and clear R foot. Pt often with anterior trunk lean.   Stairs             Wheelchair Mobility    Modified Rankin (Stroke Patients Only) Modified Rankin (Stroke Patients Only) Pre-Morbid Rankin Score: Moderate disability Modified Rankin: Moderately severe disability     Balance Overall balance assessment: Needs assistance Sitting-balance support: Feet supported;Single extremity supported Sitting balance-Leahy Scale: Poor Sitting balance - Comments: reliant on UE support or minG, impulsive   Standing balance support: Bilateral upper extremity supported Standing balance-Leahy Scale: Poor Standing balance comment: min-modA with BUE support of RW                            Cognition Arousal/Alertness: Awake/alert Behavior During  Therapy: Flat affect Overall Cognitive Status: Difficult to assess                                  General Comments: pt is HOH, impaired expressive communication also seems to be present. Pt with slowed processing and seems to do better when provided visual cues      Exercises      General Comments General comments (skin integrity, edema, etc.): VSS on RA      Pertinent Vitals/Pain Pain Assessment: No/denies pain    Home Living                      Prior Function            PT Goals (current goals can now be found in the care plan section) Acute Rehab PT Goals Patient Stated Goal: improve balance and strength, regain prior level of mod I with ADLs. goals from conversation with spouse>pt. Progress towards PT goals: Not progressing toward goals - comment (pt with more difficulty advancing and controlling RLE)    Frequency    Min 3X/week      PT Plan Current plan remains appropriate    Co-evaluation              AM-PAC PT "6 Clicks" Mobility   Outcome Measure  Help needed turning from your back to your side while in a flat bed without using bedrails?: A Little Help needed moving from lying on your back to sitting on the side of a flat bed without using bedrails?: A Lot Help needed moving to and from a bed to a chair (including a wheelchair)?: A Lot Help needed standing up from a chair using your arms (e.g., wheelchair or bedside chair)?: A Lot Help needed to walk in hospital room?: A Lot Help needed climbing 3-5 steps with a railing? : Total 6 Click Score: 12    End of Session Equipment Utilized During Treatment: Gait belt Activity Tolerance: Patient tolerated treatment well Patient left: in chair;with call bell/phone within reach;with chair alarm set;with family/visitor present Nurse Communication: Mobility status PT Visit Diagnosis: Unsteadiness on feet (R26.81);Other abnormalities of gait and mobility (R26.89);Muscle weakness (generalized) (M62.81);Other symptoms and signs involving the nervous system  (R29.898)     Time: 1000-1028 PT Time Calculation (min) (ACUTE ONLY): 28 min  Charges:  $Gait Training: 8-22 mins $Therapeutic Activity: 8-22 mins                     Zenaida Niece, PT, DPT Acute Rehabilitation Pager: [phone]    Zenaida Niece 11/21/2020, 10:58 AM

## 2020-11-21 NOTE — Progress Notes (Signed)
Neurosurgery  Patient seen and examined.  Exam unchanged today.  Remains awake, alert but aphasic, right sided weakness.  Yesterday, I had a long discussion with the patient's wife Cesar [name] and separately with his daughter Cesar [name].  The planned procedure was discussed as well as risks, benefits, alternatives, and expected convalescence. Possibility of craniotomy was also discussed. Risks, benefits, alternatives, and expected convalescence was discussed.  Risks discussed included but were not limited to bleeding, pain, infection, seizure, stroke, scar, recurrence, cerebrospinal fluid leak, neurologic deficit, paralysis, coma and death.  I also discussed with her the possibility of blood transfusion during surgery and consent to transfuse blood products if necessary was also obtained.  All questions and concerns were answered and understanding and agreement with the plan was verbalized.

## 2020-11-21 NOTE — Anesthesia Procedure Notes (Signed)
Procedure Name: Intubation Date/Time: 11/21/2020 3:06 PM Performed by: Valetta Fuller, CRNA Pre-anesthesia Checklist: Patient identified, Emergency Drugs available, Suction available and Patient being monitored Patient Re-evaluated:Patient Re-evaluated prior to induction Oxygen Delivery Method: Circle system utilized Preoxygenation: Pre-oxygenation with 100% oxygen Induction Type: IV induction Ventilation: Mask ventilation without difficulty and Oral airway inserted - appropriate to patient size Laryngoscope Size: Sabra Heck and 2 Grade View: Grade I Tube type: Oral Tube size: 8.0 mm Number of attempts: 1 Airway Equipment and Method: Stylet Placement Confirmation: ETT inserted through vocal cords under direct vision,  positive ETCO2 and breath sounds checked- equal and bilateral Secured at: 23 cm Tube secured with: Tape Dental Injury: Teeth and Oropharynx as per pre-operative assessment

## 2020-11-21 NOTE — Progress Notes (Signed)
PROGRESS NOTE    Cesar [name]   GLO:[id number]  DOB: 1928-12-12  DOA: 11/19/2020 PCP: Orpah Melter, MD   Brief Narrative:  Cesar [name]  is a 87 y.o. male with medical history significant of permanent A. fib on Eliquis, CKD stage III, CVA, seizure disorder, depression, history of DVT, hyperlipidemia, OSA not on CPAP, AAA, ascending thoracic aortic aneurysm followed by Dr. [name], severe aortic stenosis status post TAVR 10/22/2020, nonobstructive CAD presenting to the ED via EMS for evaluation of weakness and multiple falls.   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  There is associated mass-effect with 5 mm left to right midline shift. The ED physician discussed the case with neurosurgery.  Neurosurgeon recommended to hold Eliquis but not reverse it.  The patient was admitted to the hospital the plan to repeat the CT scan in the morning.  Subjective: He has no complaints.     Assessment & Plan:   Principal Problem:   SDH (subdural hematoma) - CT reveals the hematoma to be stable in size.  - he has acute mental status changes with confusion- his wife confirms that this is not his baseline - evaluated by NS  - cont to hold Xarelto for a burr hole procedure today - PT recommending CIR  Active Problems:     CKD (chronic kidney disease), stage III  - Cr is at baseline  (ranges from 1.2- 1.6)    Atrial fibrillation  -CHA2DS2-VASc Score at least 3 - Xarelto on hold- due to his frequent falls, he is not a candidate for further anticoagulations - follow on telemetry - not on a rate controlling agent  Aortic stenosis S/p TAVR - holding Xarelto and ASA  BPH - cont Proscar  Severe hearing loss  - he has difficulty with hearing despite his hearing aids   Time spent in minutes: 35 DVT prophylaxis: SCDs Code Status: Full code Family Communication: wife Level of Care: Level of care:  Progressive Disposition Plan:  Status is: Inpatient  The patient will require care spanning > 2 midnights and should be moved to inpatient because: Inpatient level of care appropriate due to severity of illness  Dispo: The patient is from: Home              Anticipated d/c is to: TBD              Anticipated d/c date is: > 3 days              Patient currently is not medically stable to d/c.   Difficult to place patient No  Consultants:   NS Procedures:   none Antimicrobials:  Anti-infectives (From admission, onward)   None       Objective: Vitals:   11/20/20 1634 11/20/20 2025 11/21/20 0021 11/21/20 0803  BP: (!) 141/71 (!) 147/67 (!) 141/92 (!) 150/99  Pulse: 71 78 (!) 101 85  Resp: 20 17 16 15   Temp: 97.7 F (36.5 C) 98.2 F (36.8 C) 98 F (36.7 C) 97.6 F (36.4 C)  TempSrc: Oral Oral  Oral  SpO2: 100% 100% 97% 98%    Intake/Output Summary (Last 24 hours) at 11/21/2020 1158 Last data filed at 11/21/2020 0555 Gross per 24 hour  Intake 120 ml  Output 1000 ml  Net -880 ml   There were no vitals filed for this visit.  Examination: General exam: Appears comfortable  HEENT: PERRLA, oral mucosa moist,  no sclera icterus or thrush Respiratory system: Clear to auscultation. Respiratory effort normal. Cardiovascular system: S1 & S2 heard, regular rate and rhythm Gastrointestinal system: Abdomen soft, non-tender, nondistended. Normal bowel sounds   Central nervous system: Alert and oriented to person onlly. No focal neurological deficits. Extremities: No cyanosis, clubbing or edema Skin: No rashes or ulcers Psychiatry:  Mood & affect appropriate.    Data Reviewed: I have personally reviewed following labs and imaging studies  CBC: Recent Labs  Lab 11/19/20 1751 11/19/20 1818 11/20/20 0319  WBC 6.8  --  6.6  NEUTROABS 5.1  --   --   HGB 12.3* 12.2* 11.6*  HCT 36.9* 36.0* 35.3*  MCV 97.6  --  97.8  PLT 114*  --  211*   Basic Metabolic Panel: Recent Labs   Lab 11/19/20 1751 11/19/20 1818  NA 137 139  K 4.7 4.8  CL 104 104  CO2 25  --   GLUCOSE 123* 119*  BUN 20 24*  CREATININE 1.34* 1.40*  CALCIUM 8.9  --    GFR: Estimated Creatinine Clearance: 40.4 mL/min (A) (by C-G formula based on SCr of 1.4 mg/dL (H)). Liver Function Tests: Recent Labs  Lab 11/19/20 1751  AST 28  ALT 22  ALKPHOS 59  BILITOT 0.9  PROT 6.6  ALBUMIN 3.1*   No results for input(s): LIPASE, AMYLASE in the last 168 hours. No results for input(s): AMMONIA in the last 168 hours. Coagulation Profile: Recent Labs  Lab 11/19/20 1751  INR 1.3*   Cardiac Enzymes: Recent Labs  Lab 11/20/20 0319  CKTOTAL 56   BNP (last 3 results) No results for input(s): PROBNP in the last 8760 hours. HbA1C: No results for input(s): HGBA1C in the last 72 hours. CBG: Recent Labs  Lab 11/20/20 2135 11/21/20 0615  GLUCAP 141* 93   Lipid Profile: No results for input(s): CHOL, HDL, LDLCALC, TRIG, CHOLHDL, LDLDIRECT in the last 72 hours. Thyroid Function Tests: No results for input(s): TSH, T4TOTAL, FREET4, T3FREE, THYROIDAB in the last 72 hours. Anemia Panel: Recent Labs    11/19/20 2013  VITAMINB12 869   Urine analysis:    Component Value Date/Time   COLORURINE YELLOW 11/20/2020 0500   APPEARANCEUR HAZY (A) 11/20/2020 0500   LABSPEC 1.013 11/20/2020 0500   PHURINE 7.0 11/20/2020 0500   GLUCOSEU NEGATIVE 11/20/2020 0500   HGBUR SMALL (A) 11/20/2020 0500   BILIRUBINUR NEGATIVE 11/20/2020 0500   KETONESUR NEGATIVE 11/20/2020 0500   PROTEINUR NEGATIVE 11/20/2020 0500   UROBILINOGEN 2.0 (H) 01/01/2012 1328   NITRITE POSITIVE (A) 11/20/2020 0500   LEUKOCYTESUR NEGATIVE 11/20/2020 0500   Sepsis Labs: @LABRCNTIP (procalcitonin:4,lacticidven:4) ) Recent Results (from the past 240 hour(s))  SARS CORONAVIRUS 2 (TAT 6-24 HRS) Nasopharyngeal Nasopharyngeal Swab     Status: None   Collection Time: 11/19/20  8:13 PM   Specimen: Nasopharyngeal Swab  Result Value  Ref Range Status   SARS Coronavirus 2 NEGATIVE NEGATIVE Final    Comment: (NOTE) SARS-CoV-2 target nucleic acids are NOT DETECTED.  The SARS-CoV-2 RNA is generally detectable in upper and lower respiratory specimens during the acute phase of infection. Negative results do not preclude SARS-CoV-2 infection, do not rule out co-infections with other pathogens, and should not be used as the sole basis for treatment or other patient management decisions. Negative results must be combined with clinical observations, patient history, and epidemiological information. The expected result is Negative.  Fact Sheet for Patients: SugarRoll.be  Fact Sheet for Healthcare Providers: https://www.woods-mathews.com/  This test  is not yet approved or cleared by the [place] and  has been authorized for detection and/or diagnosis of SARS-CoV-2 by FDA under an Emergency Use Authorization (EUA). This EUA will remain  in effect (meaning this test can be used) for the duration of the COVID-19 declaration under Se ction 564(b)(1) of the Act, 21 U.S.C. section 360bbb-3(b)(1), unless the authorization is terminated or revoked sooner.  Performed at [facility], [place] [address] [place], [place] [address]          Radiology Studies: CT HEAD WO CONTRAST  Result Date: 11/20/2020 CLINICAL DATA:  Follow-up subdural hematoma EXAM: CT HEAD WITHOUT CONTRAST TECHNIQUE: Contiguous axial images were obtained from the base of the skull through the vertex without intravenous contrast. COMPARISON:  Yesterday FINDINGS: Brain: Mixed density subdural hematoma on the left. The high-density components are mainly strandy appearing and favor nonacute blood/septation. Maximal subdural thickness is 3 cm as measured on coronal reformats. Less prominent midline shift due to the degree of atrophy, measuring 7 mm at the septum pellucidum. No entrapment or acute infarct. Chronic  small vessel ischemia in the deep cerebral white matter. Chronic encephalomalacia at the anterior right frontal lobe. Vascular: No hyperdense vessel or unexpected calcification. Skull: Normal. Negative for fracture or focal lesion. Sinuses/Orbits: Scleral buckle on the right.  No acute finding IMPRESSION: Size stable mixed density subdural hematoma measuring 3 cm in thickness along the compressed left frontal convexity. Midline shift measures 7 mm. Electronically Signed   By: Monte Fantasia M.D.   On: 11/20/2020 07:53   CT HEAD WO CONTRAST  Result Date: 11/19/2020 CLINICAL DATA:  Weakness and confusion. EXAM: CT HEAD WITHOUT CONTRAST TECHNIQUE: Contiguous axial images were obtained from the base of the skull through the vertex without intravenous contrast. COMPARISON:  CT head dated 03/15/2019. FINDINGS: Brain: A mixed density subdural fluid collection overlying the left cerebral convexity measures up to 3.0 cm in greatest depth and consistent with an acute on chronic subdural hematoma. There is associated mass effect with 5 mm left to right midline shift. The basilar cisterns are patent. There is no hydrocephalus. Periventricular white matter hypoattenuation likely represents chronic small vessel ischemic disease. Vascular: There are vascular calcifications in the carotid siphons. Skull: Normal. Negative for fracture or focal lesion. Sinuses/Orbits: Left maxillary sinus disease versus mucous retention cysts are noted. Other: None. 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 There is associated mass effect with 5 mm left to right midline shift. These results were called by telephone at the time of interpretation on 11/19/2020 at 7:09 pm to provider [facility] , who verbally acknowledged these results. Electronically Signed   By: Zerita Boers M.D.   On: 11/19/2020 19:13   CT Cervical Spine Wo Contrast  Result Date: 11/19/2020 CLINICAL  DATA:  Multiple fall EXAM: CT CERVICAL SPINE WITHOUT CONTRAST TECHNIQUE: Multidetector CT imaging of the cervical spine was performed without intravenous contrast. Multiplanar CT image reconstructions were also generated. COMPARISON:  None. FINDINGS: Alignment: No subluxation.  Facet alignment is maintained. Skull base and vertebrae: No acute fracture. No primary bone lesion or focal pathologic process. Soft tissues and spinal canal: No prevertebral fluid or swelling. No visible canal hematoma. Disc levels: Multiple level degenerative change with moderate disc space narrowing at C3-C4, C5-C6 and C6-C7. Facet degenerative changes at multiple levels. Upper chest: Emphysema at the apices Other: None IMPRESSION: 1. Degenerative changes of the cervical spine. No acute  osseous abnormality. 2. Emphysema at the apices. Emphysema (ICD10-J43.9). Electronically Signed   By: Donavan Foil M.D.   On: 11/19/2020 21:01   DG CHEST PORT 1 VIEW  Result Date: 11/19/2020 CLINICAL DATA:  Status post fall.  Weakness. EXAM: PORTABLE CHEST 1 VIEW COMPARISON:  None. FINDINGS: The lungs are hyperinflated. Mild, diffuse, chronic appearing increased lung markings are seen. There is no evidence of acute infiltrate, pleural effusion or pneumothorax. The cardiac silhouette is moderately enlarged. An artificial aortic valve is present. Marked severity calcification of the aortic arch is seen. Multilevel degenerative changes seen throughout the thoracic spine. IMPRESSION: Chronic appearing increased lung markings without evidence of acute or active cardiopulmonary disease. Electronically Signed   By: Virgina Norfolk M.D.   On: 11/19/2020 23:49      Scheduled Meds: . [START ON 11/22/2020] divalproex  500 mg Oral QHS  . finasteride  5 mg Oral Daily   Continuous Infusions:   LOS: 1 day      Debbe Odea, MD [facility] Pager: [email or web address] 11/21/2020, 11:58 AM

## 2020-11-22 ENCOUNTER — Inpatient Hospital Stay (HOSPITAL_COMMUNITY): Payer: Medicare Other

## 2020-11-22 ENCOUNTER — Encounter (HOSPITAL_COMMUNITY): Payer: Self-pay | Admitting: Neurosurgery

## 2020-11-22 DIAGNOSIS — N1831 Chronic kidney disease, stage 3a: Secondary | ICD-10-CM

## 2020-11-22 DIAGNOSIS — Z86718 Personal history of other venous thrombosis and embolism: Secondary | ICD-10-CM

## 2020-11-22 DIAGNOSIS — I712 Thoracic aortic aneurysm, without rupture: Secondary | ICD-10-CM

## 2020-11-22 DIAGNOSIS — R296 Repeated falls: Secondary | ICD-10-CM | POA: Diagnosis not present

## 2020-11-22 DIAGNOSIS — S065X9A Traumatic subdural hemorrhage with loss of consciousness of unspecified duration, initial encounter: Secondary | ICD-10-CM | POA: Diagnosis not present

## 2020-11-22 DIAGNOSIS — I4891 Unspecified atrial fibrillation: Secondary | ICD-10-CM

## 2020-11-22 DIAGNOSIS — D62 Acute posthemorrhagic anemia: Secondary | ICD-10-CM

## 2020-11-22 DIAGNOSIS — D696 Thrombocytopenia, unspecified: Secondary | ICD-10-CM

## 2020-11-22 DIAGNOSIS — G4733 Obstructive sleep apnea (adult) (pediatric): Secondary | ICD-10-CM

## 2020-11-22 DIAGNOSIS — Z8673 Personal history of transient ischemic attack (TIA), and cerebral infarction without residual deficits: Secondary | ICD-10-CM

## 2020-11-22 DIAGNOSIS — R569 Unspecified convulsions: Secondary | ICD-10-CM

## 2020-11-22 NOTE — Evaluation (Signed)
Physical Therapy Re-Evaluation Patient Details Name: Cesar [name] MRN: [id number] DOB: 1929-05-21 Today's Date: 11/22/2020   History of Present Illness  87 y.o. male with medical history significant of permanent A. fib on Eliquis, CKD stage III, CVA, seizure disorder, depression, history of DVT, hyperlipidemia, OSA not on CPAP, AAA, ascending thoracic aortic aneurysm followed by Dr. [name], severe aortic stenosis status post TAVR 10/22/2020, nonobstructive CAD presenting to the ED via EMS for evaluation of weakness and multiple falls. Head CT showing acute on chronic left cerebral convexity subdural hematoma. Pt s/p L sided burr hole x2 and placement of subdural drain for evacuation of SDH 11/21/20    Clinical Impression  The pt presents eager to mobilize with therapy this morning. He moves all extremities with good force production, but is limited in bed mobility and transfers by decreased attention to R extremities, requiring increased assist and cues to complete mobility. The pt was able to power up and complete pivot transfer with assist of 2, requires increased assist to steady and manage placement of steps. The pt will continue to benefit from skilled PT to address deficits and progress safety and independence with mobility to allow for pt to return home with family when ready.     Follow Up Recommendations CIR;Supervision for mobility/OOB    Equipment Recommendations  Wheelchair (measurements PT);Wheelchair cushion (measurements PT);Hospital bed (if home today)    Recommendations for Other Services       Precautions / Restrictions Precautions Precautions: Fall Precaution Comments: R inattention HOH with hearing aides Drain L side of head Restrictions Weight Bearing Restrictions: No      Mobility  Bed Mobility Overal bed mobility: Needs Assistance Bed Mobility: Rolling;Supine to Sit Rolling: Min guard   Supine to sit: Min assist;HOB elevated     General bed mobility  comments: hOB ~35 degrees, pt with decreased awareness to R LE and progressing toward EOB with bil UE. Pt has lack of awarness that R LE is flexed and stuck against the footboard even with verbal cues. Therapy has to lift R LE to help him initiate and progress R side toward EOB. Pt reports bil LE on floor when only LLE touching. Pt unaware R LE was not touching    Transfers Overall transfer level: Needs assistance Equipment used: 2 person hand held assist Transfers: Sit to/from Stand;Stand Pivot Transfers Sit to Stand: +2 physical assistance;Min assist Stand pivot transfers: +2 physical assistance;Min assist       General transfer comment: pt reaching with L UE toward the chair and holding PT hand R UE. pt needed cues to reach with L UE toward OT to help upright posture for transfer. Pt terminates with uncontrolled descend to chair  Ambulation/Gait Ambulation/Gait assistance: Mod assist;+2 physical assistance Gait Distance (Feet): 4 Feet Assistive device: 2 person hand held assist Gait Pattern/deviations: Step-to pattern;Decreased step length - right;Decreased weight shift to left;Decreased dorsiflexion - right     General Gait Details: pt with decreased step clearance and increased assist needed to maintain balance while stepping. inconsistent step placement, but limited this session due to MD verbal order to transfer to recliner only   Modified Rankin (Stroke Patients Only) Modified Rankin (Stroke Patients Only) Pre-Morbid Rankin Score: Moderate disability Modified Rankin: Moderately severe disability     Balance Overall balance assessment: Needs assistance Sitting-balance support: Bilateral upper extremity supported;Feet supported Sitting balance-Leahy Scale: Poor Sitting balance - Comments: reliant on BIL UE for support impulsive   Standing balance support: Bilateral upper extremity supported  Standing balance-Leahy Scale: Poor Standing balance comment: reliant on UE  support                             Pertinent Vitals/Pain Pain Assessment: Faces Faces Pain Scale: Hurts a little bit Pain Location: head Pain Descriptors / Indicators: Headache Pain Intervention(s): Monitored during session;Limited activity within patient's tolerance;Repositioned    Home Living Family/patient expects to be discharged to:: Private residence Living Arrangements: Spouse/significant other Available Help at Discharge: Family;Available 24 hours/day Type of Home: House Home Access: Stairs to enter Entrance Stairs-Rails: Right Entrance Stairs-Number of Steps: 2 Home Layout: Two level;Able to live on main level with bedroom/bathroom Home Equipment: Gilford Rile - 2 wheels;Bedside commode;Cane - single point Additional Comments: home setup obtained from PT evaluation    Prior Function Level of Independence: Independent with assistive device(s)         Comments: pt reports that he has 12 acres of land and likes riding his machinery on the land. 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[place], [place] with RW     Hand Dominance   Dominant Hand: Left    Extremity/Trunk Assessment   Upper Extremity Assessment Upper Extremity Assessment: Defer to OT evaluation (impairments in fine motor and coordination with RUE) RUE Deficits / Details: dropping objects, inattention but able to move [facility] RUE Coordination: decreased fine motor    Lower Extremity Assessment Lower Extremity Assessment: RLE deficits/detail RLE Deficits / Details: grossly 4-/5, requires increased cues and multimodal cues to follow commands related to MMT, pt then able to sustain without buckling in stance RLE Coordination: decreased fine motor    Cervical / Trunk Assessment Cervical / Trunk Assessment: Normal  Communication   Communication: HOH (has hearing aides, but not charged)  Cognition Arousal/Alertness: Awake/alert Behavior During Therapy: Flat affect Overall Cognitive  Status: Difficult to assess Area of Impairment: Safety/judgement;Memory                     Memory: Decreased short-term memory   Safety/Judgement: Decreased awareness of safety;Decreased awareness of deficits     General Comments: pt asking what time it was on arrival. Pt able to read OT watch in 14 font size. pt transferring to the chair and asking the same questions again. Pt with poor recall of asking. Pt slightly impulsive due to [facility] and attempting to anticipate therapy needs. pt states "you want me to get up" and attempting because he was sitting EOB. pt can read very well so written communication is key for his understanding until hearing aides charged      General Comments General comments (skin integrity, edema, etc.): drain clamped by RN, pt transfer to chair only request from MD at this time    Exercises General Exercises - Lower Extremity Long Arc Quad: AROM;Both;10 reps Hip Flexion/Marching: AROM;Left;5 reps;Seated   Assessment/Plan    PT Assessment Patient needs continued PT services  PT Problem List Decreased strength;Decreased activity tolerance;Decreased balance;Decreased mobility;Decreased knowledge of use of DME;Decreased safety awareness;Decreased knowledge of precautions       PT Treatment Interventions DME instruction;Gait training;Stair training;Functional mobility training;Therapeutic activities;Therapeutic exercise;Balance training;Neuromuscular re-education;Cognitive remediation;Patient/family education    PT Goals (Current goals can be found in the Care Plan section)  Acute Rehab PT Goals Patient Stated Goal: to be able to walk- i have been in that bed for a week PT Goal Formulation: With patient Time For Goal Achievement: 12/06/20 Potential to Achieve  Goals: Good    Frequency Min 4X/week        Co-evaluation PT/OT/SLP Co-Evaluation/Treatment: Yes Reason for Co-Treatment: Necessary to address cognition/behavior during functional  activity;To address functional/ADL transfers;For patient/therapist safety PT goals addressed during session: Mobility/safety with mobility;Balance;Proper use of DME OT goals addressed during session: ADL's and self-care;Proper use of Adaptive equipment and DME       AM-PAC PT "6 Clicks" Mobility  Outcome Measure Help needed turning from your back to your side while in a flat bed without using bedrails?: A Little Help needed moving from lying on your back to sitting on the side of a flat bed without using bedrails?: A Lot Help needed moving to and from a bed to a chair (including a wheelchair)?: A Lot Help needed standing up from a chair using your arms (e.g., wheelchair or bedside chair)?: A Lot Help needed to walk in hospital room?: A Lot Help needed climbing 3-5 steps with a railing? : Total 6 Click Score: 12    End of Session Equipment Utilized During Treatment: Gait belt Activity Tolerance: Patient tolerated treatment well Patient left: in chair;with call bell/phone within reach;with chair alarm set;with family/visitor present Nurse Communication: Mobility status PT Visit Diagnosis: Unsteadiness on feet (R26.81);Other abnormalities of gait and mobility (R26.89);Muscle weakness (generalized) (M62.81);Other symptoms and signs involving the nervous system (K91.791)    Time: 5056-9794 PT Time Calculation (min) (ACUTE ONLY): 23 min   Charges:   PT Evaluation $PT Re-evaluation: 1 Re-eval          Karma Ganja, PT, DPT   Acute Rehabilitation Department Pager #: [phone]  Otho Bellows 11/22/2020, 10:49 AM

## 2020-11-22 NOTE — Evaluation (Signed)
Occupational Therapy RE-Evaluation Patient Details Name: Cesar [name] MRN: [id number] DOB: 07/30/1929 Today's Date: 11/22/2020    History of Present Illness 87 y.o. male with medical history significant of permanent A. fib on Eliquis, CKD stage III, CVA, seizure disorder, depression, history of DVT, hyperlipidemia, OSA not on CPAP, AAA, ascending thoracic aortic aneurysm followed by Dr. [name], severe aortic stenosis status post TAVR 10/22/2020, nonobstructive CAD presenting to the ED via EMS for evaluation of weakness and multiple falls. Head CT showing acute on chronic left cerebral convexity subdural hematoma. Pt s/p L sided burr hole x2 and placement of subdural drain for evacuation of SDH 11/21/20   Clinical Impression   Patient is s/p burr hole with drain placement surgery resulting in functional limitations due to the deficits listed below (see OT problem list). Pt currently with balance deficits affecting all adls. Pt will need to progress to DME supervision transfer to d/c home. Pt slightly impulsive with transfer but due to [facility] pt attempting to anticipate therapy needs. Patient will benefit from skilled OT acutely to increase independence and safety with ADLS to allow discharge CIR.     Follow Up Recommendations  CIR;Supervision/Assistance - 24 hour    Equipment Recommendations  3 in 1 bedside commode    Recommendations for Other Services Rehab consult     Precautions / Restrictions Precautions Precautions: Fall Precaution Comments: R inattention HOH with hearing aides Drain L side of head      Mobility Bed Mobility Overal bed mobility: Needs Assistance Bed Mobility: Rolling;Supine to Sit Rolling: Min guard   Supine to sit: Min assist;HOB elevated     General bed mobility comments: hOB ~35 degrees, pt with decreased awareness to R LE and progressing toward EOB with bil UE. Pt has lack of awarness that R LE is flexed and stuck against the footboard even with verbal  cues. Therapy has to lift R LE to help him initiate and progress R side toward EOB. Pt reports bil LE on floor when only LLE touching. Pt unaware R LE was not touching    Transfers Overall transfer level: Needs assistance Equipment used: 2 person hand held assist Transfers: Sit to/from Stand;Stand Pivot Transfers Sit to Stand: +2 physical assistance;Min assist Stand pivot transfers: +2 physical assistance;Min assist       General transfer comment: pt reaching with L UE toward the chair and holding PT hand R UE. pt needed cues to reach with L UE toward OT to help upright posture for transfer. Pt terminates with uncontrolled descend to chair    Balance Overall balance assessment: Needs assistance Sitting-balance support: Bilateral upper extremity supported;Feet supported Sitting balance-Leahy Scale: Poor Sitting balance - Comments: reliant on BIL UE for support impulsive   Standing balance support: Bilateral upper extremity supported Standing balance-Leahy Scale: Poor Standing balance comment: reliant on UE support                           ADL either performed or assessed with clinical judgement   ADL Overall ADL's : Needs assistance/impaired Eating/Feeding: Set up;Bed level Eating/Feeding Details (indicate cue type and reason): noted to have food in the bed with him due to spillage. pt after getting up states "who put those potatos in my bed?"                 Lower Body Dressing: Moderate assistance;Bed level Lower Body Dressing Details (indicate cue type and reason): pt is unable to open  sock and even with hooked sock over toes unable to pull up with hands. pt able to reach but not able to pull up due to fine motor challenges Toilet Transfer: +2 for physical assistance;Minimal assistance;Stand-pivot Toilet Transfer Details (indicate cue type and reason): simulated bed to chair           General ADL Comments: pt only allowed oOB to chair this session. pt  demonstrates decreased awareness to R side     Vision   Vision Assessment?: No apparent visual deficits Additional Comments: able to read 14 font able to read calendar. pt able to look acorss the room a the news Radar image and state we are getting rain     Perception Perception Perception Tested?: Yes Perception Deficits: Inattention/neglect Inattention/Neglect: Does not attend to right side of body Comments: pt will turn head toward the right with auditory cues, pt will move R side with visual attention to that side.   Praxis      Pertinent Vitals/Pain Pain Assessment: Faces Faces Pain Scale: Hurts a little bit Pain Location: head Pain Descriptors / Indicators: Headache Pain Intervention(s): Monitored during session;Repositioned     Hand Dominance Left   Extremity/Trunk Assessment Upper Extremity Assessment Upper Extremity Assessment: RUE deficits/detail;Generalized weakness RUE Deficits / Details: dropping objects, inattention but able to move [facility] RUE Coordination: decreased fine motor   Lower Extremity Assessment Lower Extremity Assessment: Defer to PT evaluation   Cervical / Trunk Assessment Cervical / Trunk Assessment: Normal   Communication Communication Communication: HOH (has hearing aides but are not currently charged)   Cognition Arousal/Alertness: Awake/alert Behavior During Therapy: Flat affect Overall Cognitive Status: Difficult to assess Area of Impairment: Safety/judgement;Memory                     Memory: Decreased short-term memory   Safety/Judgement: Decreased awareness of safety;Decreased awareness of deficits     General Comments: pt asking what time it was on arrival. Pt able to read OT watch in 14 font size. pt transferring to the chair and asking the same questions again. Pt with poor recall of asking. Pt slightly impulsive due to [facility] and attempting to anticipate therapy needs. pt states "you want me to get up" and attempting  because he was sitting EOB. pt can read very well so written communication is key for his understanding until hearing aides charged   General Comments  drain clamped by RN, pt transfer to chair only request from MD at this time    Exercises     Shoulder Instructions      Home Living Family/patient expects to be discharged to:: Private residence Living Arrangements: Spouse/significant other Available Help at Discharge: Family;Available 24 hours/day Type of Home: House Home Access: Stairs to enter [employer] of Steps: 2 Entrance Stairs-Rails: Right Home Layout: Two level;Able to live on main level with bedroom/bathroom     Bathroom Shower/Tub: Occupational psychologist: Standard     Home Equipment: Environmental consultant - 2 wheels;Bedside commode;Cane - single point   Additional Comments: home setup obtained from PT evaluation      Prior Functioning/Environment Level of Independence: Independent with assistive device(s)        Comments: pt reports that he has 12 acres of land and likes riding his machinery on the land. 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[place], [place] with [employer]        OT Problem List: Decreased strength;Decreased activity tolerance;Impaired balance (sitting and/or  standing);Decreased cognition;Decreased knowledge of use of DME or AE;Decreased knowledge of precautions;Cardiopulmonary status limiting activity;Pain      OT Treatment/Interventions: Self-care/ADL training;Therapeutic exercise;Neuromuscular education;DME and/or AE instruction;Therapeutic activities;Cognitive remediation/compensation;Patient/family education;Balance training    OT Goals(Current goals can be found in the care plan section) Acute Rehab OT Goals Patient Stated Goal: to be able to walk- i have been in that bed for a week OT Goal Formulation: With patient Time For Goal Achievement: 12/06/20 Potential to Achieve Goals: Good  OT Frequency: Min 2X/week    Barriers to D/C:            Co-evaluation PT/OT/SLP Co-Evaluation/Treatment: Yes Reason for Co-Treatment: For patient/therapist safety;To address functional/ADL transfers   OT goals addressed during session: ADL's and self-care;Proper use of Adaptive equipment and DME      AM-PAC OT "6 Clicks" Daily Activity     Outcome Measure Help from another person eating meals?: A Little Help from another person taking care of personal grooming?: A Little Help from another person toileting, which includes using toliet, bedpan, or urinal?: A Lot Help from another person bathing (including washing, rinsing, drying)?: A Lot Help from another person to put on and taking off regular upper body clothing?: A Lot Help from another person to put on and taking off regular lower body clothing?: A Lot 6 Click Score: 14   End of Session Nurse Communication: Mobility status;Precautions  Activity Tolerance: Patient tolerated treatment well Patient left: in chair;with call bell/phone within reach;with chair alarm set  OT Visit Diagnosis: Muscle weakness (generalized) (M62.81)                Time: 8325-4982 OT Time Calculation (min): 23 min Charges:  OT General Charges $OT Visit: 1 Visit OT Evaluation $OT Re-eval: 1 Re-eval   Brynn, OTR/L  Acute Rehabilitation Services Pager: [phone] Office: [phone] .   Jeri Modena 11/22/2020, 9:22 AM

## 2020-11-22 NOTE — Progress Notes (Signed)
Inpatient Rehab Admissions Coordinator:   At this time we are recommending a CIR consult and I will place an order per our protocol.   Shann Medal, PT, DPT Admissions Coordinator [phone] 11/22/20  1:50 PM

## 2020-11-22 NOTE — Progress Notes (Signed)
Subjective: Patient reports no headache  Objective: Vital signs in last 24 hours: Temp:  [97 F (36.1 C)-98.8 F (37.1 C)] 97.5 F (36.4 C) (02/18 0400) Pulse Rate:  [47-109] 109 (02/18 0800) Resp:  [12-27] 22 (02/18 0800) BP: (104-151)/(57-98) 138/66 (02/18 0800) SpO2:  [94 %-100 %] 99 % (02/18 0800)  Intake/Output from previous day: 02/17 0701 - 02/18 0700 In: 1339.2 [I.V.:495.9; IV Piggyback:843.4] Out: 1260 [Urine:1205; Drains:55] Intake/Output this shift: Total I/O In: 38.4 [I.V.:6.8; IV Piggyback:31.6] Out: -   NAD Alert, awake.  Oriented to person.  Follows simple commands, asks questions "Who are you?" FC x 4, mild R sided drift Dressings c/d Drain output 55 ml  Lab Results: Recent Labs    11/19/20 1751 11/19/20 1818 11/20/20 0319  WBC 6.8  --  6.6  HGB 12.3* 12.2* 11.6*  HCT 36.9* 36.0* 35.3*  PLT 114*  --  107*   BMET Recent Labs    11/19/20 1751 11/19/20 1818  NA 137 139  K 4.7 4.8  CL 104 104  CO2 25  --   GLUCOSE 123* 119*  BUN 20 24*  CREATININE 1.34* 1.40*  CALCIUM 8.9  --     Studies/Results: CT HEAD WO CONTRAST  Result Date: 11/22/2020 CLINICAL DATA:  Follow-up subdural hematoma EXAM: CT HEAD WITHOUT CONTRAST TECHNIQUE: Contiguous axial images were obtained from the base of the skull through the vertex without intravenous contrast. COMPARISON:  Two days ago FINDINGS: Brain: Interval drainage of subdural hematoma along the left frontal convexity. Drain is present along with remaining fluid and gas. Maximal subdural thickness is 15 mm the midline shift is essentially resolved in the setting of atrophy Remote right frontal infarct. Chronic small vessel ischemia in the cerebral white matter. No hydrocephalus. Vascular: No hyperdense vessel or unexpected calcification. Skull: Left-sided burr holes for drainage. Sinuses/Orbits: No acute finding IMPRESSION: Decompressed subdural hematoma on the left essentially resolved midline shift. Electronically  Signed   By: Monte Fantasia M.D.   On: 11/22/2020 06:05    Assessment/Plan: S/p drainage of L chronic SDH with loculations - cont SD drain for 1 more day - mobilize - ok for pharacologic DVT prophylaxis tomorrow after drain removal  - will need to hold anticoagulation for likely 6 weeks or longer, depending on subsequent CT scan at 4 wks   [name] 11/22/2020, 8:27 AM

## 2020-11-22 NOTE — Consult Note (Signed)
Physical Medicine and Rehabilitation Consult Reason for Consult: Weakness altered mental status with multiple falls Referring Physician: [facility]  HPI: Cesar [name] is a 87 y.o. right-handed male with history of atrial fibrillation on Eliquis, CKD stage III, CVA, seizure disorder, history of DVT, hyperlipidemia, OSA not on CPAP, ascending thoracic aortic aneurysm followed by Dr. Caffie Pinto, severe aortic stenosis status post TAVR 10/22/2020, nonobstructive CAD.  History taken from chart review and daughter due to [facility].  Patient lives with spouse.  Two-level home bed and bath main level 2 steps to entry.  Independent with assistive device.  He presented on 11/21/2020 after increasing weakness with increasing falls and AMS.  Cranial CT scan showed large chronic left SDH.  Patient underwent left-sided bur hole x2 placement of subdural drain for evacuation of subdural hematoma on 11/21/2020 per Dr. Duffy Rhody.  Presently maintained on both Depakote as well as Dilantin for seizures.  Repeat head CT personally reviewed, showing improvement in SDH (status post decompression).  Tolerating a regular consistency diet.  Cardene drip initiated for blood pressure control.  His chronic Eliquis currently remains on hold x6 weeks.  Therapy evaluations completed recommendations of physical medicine rehab consult due to altered mental status and multiple falls.  Review of Systems  Unable to perform ROS: Other  Severe HOH  Past Medical History:  Diagnosis Date  . AAA (abdominal aortic aneurysm) ([place])   . CKD (chronic kidney disease), stage III (H[facility])    baseline Cr 1.4-1.5 in 2016-2017 by PCP)  . CVA (cerebral infarction)   . Depression   . DVT (deep venous thrombosis) ([place]) 2006  . H/O blood clots   . Hematuria   . HOH (hard of hearing)   . Hypercholesterolemia   . OSA (obstructive sleep apnea)    intol to CPAP  . Permanent atrial fibrillation ([place])   . Prostate enlargement   . Pulmonary nodules   .  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 Buena Irish and Dr. Roxy Manns  . Thoracic ascending aortic aneurysm ([place])    4.5cm in 11/2018, stable since 2016  . TIA (transient ischemic attack)    Past Surgical History:  Procedure Laterality Date  . BURR HOLE Left 11/21/2020   Procedure: LEFT Side BURR HOLES,;  Surgeon: [name], MD;  Location: [facility];  Service: Neurosurgery;  Laterality: Left;  . EMBOLECTOMY Left 07/02/2016   Procedure: EMBOLECTOMY LEFT FEMORAL ARTERY;  Surgeon: [name], MD;  Location: [facility];  Service: Vascular;  Laterality: Left;  Marland Kitchen MESENTERIC ARTERY BYPASS N/A 08/06/2019   Procedure: SUPERIOR MESENTERIC ARTERY THROMBECTOMY;  Surgeon: [name], MD;  Location: [facility];  Service: Vascular;  Laterality: N/A;  . NM [place]  08/24/2002   markedly positive,subtle lateral ischemia  . PATCH ANGIOPLASTY  08/06/2019   Procedure: Patch Angioplasty of superior messenteric artery with RIGHT femoral vein;  Surgeon: [name], MD;  Location: [facility];  Service: Vascular;;  . PROSTATE SURGERY    . RIGHT HEART CATH AND CORONARY ANGIOGRAPHY N/A 10/21/2020   Procedure: RIGHT HEART CATH AND CORONARY ANGIOGRAPHY;  Surgeon: [name], MD;  Location: [facility];  Service: Cardiovascular;  Laterality: N/A;  . TEE WITHOUT CARDIOVERSION N/A 10/22/2020   Procedure: TRANSESOPHAGEAL ECHOCARDIOGRAM (TEE);  Surgeon: [name], MD;  Location: [facility];  Service: Open Heart Surgery;  Laterality: N/A;  .  TRANSCATHETER AORTIC VALVE REPLACEMENT, TRANSFEMORAL Right 10/22/2020   Procedure: TRANSCATHETER AORTIC VALVE REPLACEMENT, TRANSFEMORAL;  Surgeon: [name], MD;  Location: [facility];  Service: Open Heart Surgery;  Laterality: Right;  . US ECHOCARDIOGRAPHY  01/06/2012   mod. AOV ca+,mild to mod. AS,mod mostly posterior MAC-mild MR   Family History  Problem  Relation Age of Onset  . Heart failure Mother   . Diabetes Father   . Stroke Father    Social History:  reports that he quit smoking about 68 years ago. His smoking use included cigarettes. He has a 10.00 pack-year smoking history. He has never used smokeless tobacco. He reports previous alcohol use. He reports that he does not use drugs. Allergies:  Allergies  Allergen Reactions  . Flomax [Tamsulosin Hcl] Other (See Comments)    Not able to urinate  . Levetiracetam Other (See Comments)    Unknown reaction   Medications Prior to Admission  Medication Sig Dispense Refill  . acetaminophen (TYLENOL) 500 MG tablet Take 500 mg by mouth every 8 (eight) hours as needed for mild pain.    Marland Kitchen amoxicillin (AMOXIL) 500 MG tablet Take 4 tablets (2000 mg) one hour prior to all dental visits. 8 tablet 11  . apixaban (ELIQUIS) 5 MG TABS tablet Take 5 mg by mouth 2 (two) times daily.    Marland Kitchen aspirin EC 81 MG tablet Take 81 mg by mouth daily. Swallow whole.    Marland Kitchen atorvastatin (LIPITOR) 80 MG tablet Take 80 mg by mouth daily.    Marland Kitchen b complex vitamins capsule Take 1 capsule by mouth daily.    . divalproex (DEPAKOTE ER) 500 MG 24 hr tablet Take 1 tablet (500 mg total) by mouth at bedtime. 30 tablet 11  . finasteride (PROSCAR) 5 MG tablet Take 5 mg by mouth daily.    . Rivaroxaban (XARELTO) 15 MG TABS tablet Take 1 tablet (15 mg total) by mouth daily with supper. (Patient not taking: No sig reported) 90 tablet 3    Home: Home Living Family/patient expects to be discharged to:: Private residence Living Arrangements: Spouse/significant other Available Help at Discharge: Family,Available 24 hours/day Type of Home: House Home Access: Stairs to enter [employer] of Steps: 2 Entrance Stairs-Rails: Right Home Layout: Two level,Able to live on main level with bedroom/bathroom Bathroom Shower/Tub: Multimedia programmer: Standard Home Equipment: Environmental consultant - 2 wheels,Bedside commode,Cane - single  point Additional Comments: home setup obtained from PT evaluation  Functional History: Prior Function Level of Independence: Independent with assistive device(s) Comments: pt reports that he has 12 acres of land and likes riding his machinery on the land. 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[place], [place] with RW Functional Status:  Mobility: Bed Mobility Overal bed mobility: Needs Assistance Bed Mobility: Rolling,Supine to Sit Rolling: Min guard Supine to sit: Min assist,HOB elevated General bed mobility comments: hOB ~35 degrees, pt with decreased awareness to R LE and progressing toward EOB with bil UE. Pt has lack of awarness that R LE is flexed and stuck against the footboard even with verbal cues. Therapy has to lift R LE to help him initiate and progress R side toward EOB. Pt reports bil LE on floor when only LLE touching. Pt unaware R LE was not touching Transfers Overall transfer level: Needs assistance Equipment used: 2 person hand held assist Transfers: Sit to/from [employer] Sit to Stand: +2 physical assistance,Min assist Stand pivot transfers: +2 physical assistance,Min assist General transfer comment: pt  reaching with L UE toward the chair and holding PT hand R UE. pt needed cues to reach with L UE toward OT to help upright posture for transfer. Pt terminates with uncontrolled descend to chair Ambulation/Gait Ambulation/Gait assistance: Mod assist,+2 physical assistance Gait Distance (Feet): 4 Feet Assistive device: 2 person hand held assist Gait Pattern/deviations: Step-to pattern,Decreased step length - right,Decreased weight shift to left,Decreased dorsiflexion - right General Gait Details: pt with decreased step clearance and increased assist needed to maintain balance while stepping. inconsistent step placement, but limited this session due to MD verbal order to transfer to recliner only Gait velocity: reduced Gait velocity  interpretation: <1.31 ft/sec, indicative of household ambulator    ADL: ADL Overall ADL's : Needs assistance/impaired Eating/Feeding: Set up,Bed level Eating/Feeding Details (indicate cue type and reason): noted to have food in the bed with him due to spillage. pt after getting up states "who put those potatos in my bed?" Grooming: Sitting,Set up,Min guard Grooming Details (indicate cue type and reason): may need cues for inititation and sequencing? Upper Body Bathing: Minimal assistance,Sitting Lower Body Bathing: Moderate assistance,Sit to/from stand Upper Body Dressing : Set up,Min guard,Minimal assistance,Sitting Lower Body Dressing: Moderate assistance,Bed level Lower Body Dressing Details (indicate cue type and reason): pt is unable to open sock and even with hooked sock over toes unable to pull up with hands. pt able to reach but not able to pull up due to fine motor challenges Toilet Transfer: +2 for physical assistance,Minimal assistance,Stand-pivot Toilet Transfer Details (indicate cue type and reason): simulated bed to chair Toileting- Clothing Manipulation and Hygiene: Moderate assistance,Sit to/from stand Functional mobility during ADLs: Minimal assistance,Rolling walker,Moderate assistance,Cueing for sequencing General ADL Comments: pt only allowed oOB to chair this session. pt demonstrates decreased awareness to R side  Cognition: Cognition Overall Cognitive Status: Difficult to assess Orientation Level: Oriented to person,Oriented to place,Disoriented to situation,Disoriented to time Cognition Arousal/Alertness: Awake/alert Behavior During Therapy: Flat affect Overall Cognitive Status: Difficult to assess Area of Impairment: Safety/judgement,Memory Memory: Decreased short-term memory Safety/Judgement: Decreased awareness of safety,Decreased awareness of deficits General Comments: pt asking what time it was on arrival. Pt able to read OT watch in 14 font size. pt  transferring to the chair and asking the same questions again. Pt with poor recall of asking. Pt slightly impulsive due to [facility] and attempting to anticipate therapy needs. pt states "you want me to get up" and attempting because he was sitting EOB. pt can read very well so written communication is key for his understanding until hearing aides charged Difficult to assess due to: Hard of hearing/deaf  Blood pressure (!) 135/119, pulse 75, temperature 98.4 F (36.9 C), temperature source Oral, resp. rate 18, SpO2 99 %. Physical Exam Vitals reviewed.  Constitutional:      General: He is not in acute distress.    Appearance: He is not ill-appearing.  HENT:     Head:     Comments: Left bur hole site clean and dry with drain.    Right Ear: External ear normal.     Left Ear: External ear normal.     Nose: Nose normal.  Eyes:     General:        Right eye: No discharge.        Left eye: No discharge.     Extraocular Movements: Extraocular movements intact.  Cardiovascular:     Comments: Irregularly irregular Pulmonary:     Effort: Pulmonary effort is normal. No respiratory distress.  Breath sounds: No stridor.  Abdominal:     General: Abdomen is flat. Bowel sounds are normal. There is no distension.  Musculoskeletal:     Cervical back: Normal range of motion and neck supple.     Comments: No edema or tenderness in extremities  Skin:    Comments: Left scalp with dressing CDI  Neurological:     Mental Status: He is alert.     Comments: Alert Makes eye contact with examiner.   He does have a right side drift. Very HOH Motor: Appears to be 5/5 throughout  Psychiatric:     Comments: ?  Slowed, limited due to [facility]     Results for orders placed or performed during the hospital encounter of 11/19/20 (from the past 24 hour(s))  Glucose, capillary     Status: Abnormal   Collection Time: 11/21/20  7:48 PM  Result Value Ref Range   Glucose-Capillary 123 (H) 70 - 99 mg/dL   CT HEAD WO  CONTRAST  Result Date: 11/22/2020 CLINICAL DATA:  Follow-up subdural hematoma EXAM: CT HEAD WITHOUT CONTRAST TECHNIQUE: Contiguous axial images were obtained from the base of the skull through the vertex without intravenous contrast. COMPARISON:  Two days ago FINDINGS: Brain: Interval drainage of subdural hematoma along the left frontal convexity. Drain is present along with remaining fluid and gas. Maximal subdural thickness is 15 mm the midline shift is essentially resolved in the setting of atrophy Remote right frontal infarct. Chronic small vessel ischemia in the cerebral white matter. No hydrocephalus. Vascular: No hyperdense vessel or unexpected calcification. Skull: Left-sided burr holes for drainage. Sinuses/Orbits: No acute finding IMPRESSION: Decompressed subdural hematoma on the left essentially resolved midline shift. Electronically Signed   By: Monte Fantasia M.D.   On: 11/22/2020 06:05    Assessment/Plan: Diagnosis: Chronic left SDH  [place] score:  >/VI  Speech to evaluate for Post traumatic amnesia and interval GOAT scores to assess progress.  NeuroPsych evaluation for behavorial assessment.  Provide environmental management by reducing the level of stimulation, tolerating restlessness when possible, protecting patient from harming self or others and reducing patient's cognitive confusion.  Address behavioral concerns include providing structured environments and daily routines.  Cognitive therapy to direct modular abilities in order to maintain goals  including problem solving, self regulation/monitoring, self management, attention, and memory.  Fall precautions; pt at risk for second impact syndrome  Prevention of secondary injury: monitor for hypotension, hypoxia, seizures or signs of increased ICP  AED:   PT/OT consults for mobility strengthening, endurance training and adaptive ADLs   Avoid medications that could impair cognitive abilities, such as anticholinergics,  antihistaminic, benzodiazapines, narcotics, etc when possible Labs and images (see above) independently reviewed.  Records reviewed and summated above.  1. Does the need for close, 24 hr/day medical supervision in concert with the patient's rehab needs make it unreasonable for this patient to be served in a less intensive setting? Potentially  2. Co-Morbidities requiring supervision/potential complications: atrial fibrillation (Eliquis on hold, monitor heart rate with increased activity), CKD stage III (avoid nephrotoxic meds, repeat labs), CVA, seizure disorder (continue medication), history of DVT (restart Eliquis after 6 weeks), hyperlipidemia, OSA not on CPAP (monitor for daytime somnolence and headaches), ascending thoracic aortic aneurysm, severe aortic stenosis status post TAVR, ABLA (repeat labs, consider transfusion if necessary to ensure appropriate perfusion for increased activity tolerance), Thrombocytopenia (< 60,000/mm3 no resistive exercise), BPH (finasteride) 3. Due to bladder management, bowel management, safety, skin/wound care, disease management, medication administration,  pain management and patient education, does the patient require 24 hr/day rehab nursing? Yes 4. Does the patient require coordinated care of a physician, rehab nurse, therapy disciplines of PT/OT/SLP to address physical and functional deficits in the context of the above medical diagnosis(es)? Potentially Addressing deficits in the following areas: balance, endurance, locomotion, strength, transferring, bathing, dressing, toileting, cognition and psychosocial support 5. Can the patient actively participate in an intensive therapy program of at least 3 hrs of therapy per day at least 5 days per week? Yes 6. The potential for patient to make measurable gains while on inpatient rehab is excellent 7. Anticipated functional outcomes upon discharge from inpatient rehab are supervision and min assist  with PT, supervision  and min assist with OT, modified independent and supervision with SLP. 8. Estimated rehab length of stay to reach the above functional goals is: 8-12 days. 9. Anticipated discharge destination: Home 10. Overall Rehab/Functional Prognosis: good  RECOMMENDATIONS: This patient's condition is appropriate for continued rehabilitative care in the following setting: Based on initial evaluation, suspect patient will progress relatively quickly to at/near baseline level of functioning.  Patient required some assistance with multiple falls PTA.  If patient does not continue to progress, would recommend CIR if adequate caregiver support available upon discharge. Patient has agreed to participate in recommended program. Potentially Note that insurance prior authorization may be required for reimbursement for recommended care.  Comment: Rehab Admissions Coordinator to follow up.  I have personally performed a face to face diagnostic evaluation, including, but not limited to relevant history and physical exam findings, of this patient and developed relevant assessment and plan.  Additionally, I have reviewed and concur with the physician assistant's documentation above.   Delice Lesch, MD, ABPMR Lavon Paganini Angiulli, PA-C 11/22/2020

## 2020-11-23 MED ORDER — PANTOPRAZOLE SODIUM 40 MG PO TBEC
40.0000 mg | DELAYED_RELEASE_TABLET | Freq: Every day | ORAL | Status: DC
  Administered 2020-11-23 – 2020-12-01 (×8): 40 mg via ORAL
  Filled 2020-11-23 (×8): qty 1

## 2020-11-23 NOTE — Progress Notes (Signed)
Inpatient Rehab Admissions Coordinator:   Met with patient at bedside to discuss potential CIR admission. Pt. Currently lacks insight into deficits and just wants to go home. His daughter states family cannot manage pt. At home at his current level and would like to pursue CIR for this patient.   Will pursue for potential admit next week, pending bed availability.   Clemens Catholic, [place], [place] Admissions Coordinator  [phone] ([place]) [phone] (office)

## 2020-11-23 NOTE — Evaluation (Signed)
Speech Language Pathology Evaluation Patient Details Name: Cesar [name] MRN: [id number] DOB: Oct 12, 1928 Today's Date: 11/23/2020 Time: 5170-0174 SLP Time Calculation (min) (ACUTE ONLY): 32 min  Problem List:  Patient Active Problem List   Diagnosis Date Noted  . Seizures ([facility])   . History of CVA (cerebrovascular accident)   . History of DVT (deep vein thrombosis)   . OSA (obstructive sleep apnea)   . Thrombocytopenia ([place])   . Frequent falls 11/20/2020  . Subdural bleeding ([place]) 11/20/2020  . SDH (subdural hematoma) ([place]) 11/19/2020  . Pressure injury of skin 10/23/2020  . 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 Severe aortic stenosis   . Syncope 10/16/2020  . Gait abnormality 04/15/2020  . Confusion 11/16/2019  . Cerebrovascular accident (CVA) ([place]) 11/16/2019  . 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 Acute ischemic stroke ([place]) 03/15/2019  . PAD (peripheral artery disease) ([place]) 07/23/2018  . Ischemia of lower extremity 07/12/2016  . Acute blood loss anemia 07/12/2016  . Depression 07/12/2016  . BPH (benign prostatic hyperplasia) 07/12/2016  . AAA (abdominal aortic aneurysm) ([place])   . Atrial fibrillation ([place])   . Embolism ([place]) 07/02/2016  . CKD (chronic kidney disease), stage III ([place])   . Claudication ([place]) 06/04/2016  . Long term current use of anticoagulant 06/04/2016  . Abnormal ECG 06/04/2016  . Thoracic ascending aortic aneurysm ([place]) 04/22/2015  . Permanent atrial fibrillation ([place]) 05/11/2013  . Mild aortic stenosis 05/11/2013  . Orthostatic hypotension 05/11/2013  . Hypercholesterolemia 05/11/2013   Past Medical History:  Past Medical History:  Diagnosis Date  . AAA (abdominal aortic aneurysm) ([place])   . CKD (chronic kidney disease), stage III (HCC)    baseline Cr 1.4-1.5 in 2016-2017 by PCP)  . CVA (cerebral infarction)   . Depression   . DVT (deep venous  thrombosis) ([place]) 2006  . H/O blood clots   . Hematuria   . HOH (hard of hearing)   . Hypercholesterolemia   . OSA (obstructive sleep apnea)    intol to CPAP  . Permanent atrial fibrillation ([place])   . Prostate enlargement   . Pulmonary nodules   . 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 Buena Irish and Dr. Roxy Manns  . Thoracic ascending aortic aneurysm ([place])    4.5cm in 11/2018, stable since 2016  . TIA (transient ischemic attack)    Past Surgical History:  Past Surgical History:  Procedure Laterality Date  . BURR HOLE Left 11/21/2020   Procedure: LEFT Side BURR HOLES,;  Surgeon: [name], MD;  Location: [facility];  Service: Neurosurgery;  Laterality: Left;  . EMBOLECTOMY Left 07/02/2016   Procedure: EMBOLECTOMY LEFT FEMORAL ARTERY;  Surgeon: [name], MD;  Location: [facility];  Service: Vascular;  Laterality: Left;  Marland Kitchen MESENTERIC ARTERY BYPASS N/A 08/06/2019   Procedure: SUPERIOR MESENTERIC ARTERY THROMBECTOMY;  Surgeon: [name], MD;  Location: [facility];  Service: Vascular;  Laterality: N/A;  . NM [place]  08/24/2002   markedly positive,subtle lateral ischemia  . PATCH ANGIOPLASTY  08/06/2019   Procedure: Patch Angioplasty of superior messenteric artery with RIGHT femoral vein;  Surgeon: [name], MD;  Location: [facility];  Service: Vascular;;  . PROSTATE SURGERY    . RIGHT HEART CATH AND CORONARY ANGIOGRAPHY N/A 10/21/2020   Procedure: RIGHT HEART CATH AND CORONARY ANGIOGRAPHY;  Surgeon: [name], MD;  Location: [facility];  Service: Cardiovascular;  Laterality: N/A;  . TEE WITHOUT CARDIOVERSION N/A 10/22/2020   Procedure: TRANSESOPHAGEAL ECHOCARDIOGRAM (TEE);  Surgeon: [name], MD;  Location: [facility];  Service: Open Heart Surgery;  Laterality: N/A;  . TRANSCATHETER AORTIC VALVE REPLACEMENT, TRANSFEMORAL Right 10/22/2020   Procedure:  TRANSCATHETER AORTIC VALVE REPLACEMENT, TRANSFEMORAL;  Surgeon: [name], MD;  Location: [facility];  Service: Open Heart Surgery;  Laterality: Right;  . US ECHOCARDIOGRAPHY  01/06/2012   mod. AOV ca+,mild to mod. AS,mod mostly posterior MAC-mild MR   HPI:  87 y.o. male with medical history significant of permanent A. fib, CKD stage III, CVA, seizure disorder, depression, history of DVT, hyperlipidemia, OSA not on CPAP, AAA, ascending thoracic aortic aneurysm, severe aortic stenosis status post TAVR 10/22/2020, nonobstructive CAD presenting to the ED via EMS for evaluation of weakness and multiple falls. Head CT showing acute on chronic left cerebral convexity subdural hematoma. Pt s/p L sided burr hole x2 and placement of subdural drain for evacuation of SDH 11/21/20   Assessment / Plan / Recommendation Clinical Impression  Pt seen for speech-language-cognitive assessment with daughter present. Oral-motor exam was unremarkable with intelligible speech. There were no overt language deficits. Daughter did mention several instances of difficulty with word finding. He exhibited cognitive impairments in the areas of verbal problem solving, awareness, working memory. He required encouragement to continue with assessment during SLUMS exam and on the third challenging task, pt placed pen on table and stated "I'm done" and asked therapist to leave despite encouragement. Outside room pt's daughter shared that he is stoic, stubburn and does not like to be challenged. Prior to admission she felt his cognition and memory were good although he does not have responsibilities to challenge him at home as wife is responsible for all household, financial, medical activities and is pt's "caregiver". Suspect cognitive deficits in setting of a stronger pt personality and behaviors. ST will continue attempts at cognitive intervention.    SLP Assessment  SLP Recommendation/Assessment: Patient needs continued Speech  [place] [facility] SLP Visit Diagnosis: Cognitive communication deficit (R41.841)    Follow Up Recommendations  Inpatient Rehab    Frequency and Duration min 1 x/week  2 weeks      SLP Evaluation Cognition  Overall Cognitive Status: Impaired/Different from baseline Arousal/Alertness: Awake/alert Orientation Level: Oriented to person;Oriented to place;Oriented to situation;Disoriented to time Attention: Sustained Sustained Attention: Appears intact Memory: Impaired Memory Impairment: Storage deficit;Retrieval deficit Awareness: Impaired Awareness Impairment: Intellectual impairment;Emergent impairment;Anticipatory impairment Problem Solving: Impaired Problem Solving Impairment: Functional basic;Verbal basic Behaviors: Other (comment) (frustration) Safety/Judgment: Impaired       Comprehension  Auditory Comprehension Overall Auditory Comprehension: Appears within functional limits for tasks assessed Visual Recognition/Discrimination Discrimination: Not tested Reading Comprehension Reading Status: Not tested    Expression Expression Primary Mode of Expression: Verbal Verbal Expression Overall Verbal Expression: Appears within functional limits for tasks assessed Written Expression Dominant Hand: Left Written Expression: Not tested   Oral / Motor  Oral Motor/Sensory Function Overall Oral Motor/Sensory Function: Within functional limits Motor Speech Overall Motor Speech: Appears within functional limits for tasks assessed Intelligibility: Intelligible Motor Planning: Witnin functional limits   GO                    [name] 11/23/2020, 12:52 PM lumbosacral

## 2020-11-23 NOTE — Progress Notes (Signed)
APP Bl[place] paged at [facility]. Awaiting call back and/or a change in orders. Patient's SBP is above goal of 140. Vasotec has been given. Patient's heart rate staying at or below 70 consistently, so labetalol held. This RN wished to discuss with provider whether SBP goal of 140 is still needed, and/or a change in PRN blood pressure medication orders. This RN wished to keep patient off continuous cardene drip for BP if able. Awaiting call back. Will reassess blood pressure. Will begin cardene drip if BP still above goal.

## 2020-11-23 NOTE — PMR Pre-admission (Signed)
PMR Admission Coordinator Pre-Admission Assessment  Patient: Cesar [name] is an 87 y.o., male MRN: [id number] DOB: 07-29-1929 Height: 6\' 1"  (185.4 cm) Weight: 89.4 kg              Insurance Information HMO:     PPO:      PCP:      IPA:      80/20: yes     OTHER:  PRIMARY: Medicare Part A and B      Policy#: [id number]   Subscriber: Pt CM Name:       Phone#:      Fax#:  Pre-Cert#: verified Civil engineer, contracting:  Benefits:  Phone #:      Name:  Eff. Date: 06/05/97 A and B     Deduct: $1566      Out of Pocket Max: n/a      Life Max: n/a CIR: 100%      SNF: 20 full days Outpatient: 80%     Co-Pay: 20% Home Health: 100%      Co-Pay:  DME: 80%     Co-Pay: 20% Providers: pt choice  SECONDARY: Generic Commercial      Policy#: [id number]      Phone#:   Financial Counselor:       Phone#:   The "Data Collection Information Summary" for patients in Inpatient Rehabilitation Facilities with attached "Privacy Act [place] Records" was provided and verbally reviewed with: Patient and Family  Emergency Contact Information Contact Information    Name Relation Home Work [place], [place] Daughter   [phone]   Jerrin, Recore [phone]     Jesusmanuel, [place] Spouse [phone]     Lindenbaum,patsy Spouse   [phone]     Current Medical History  Patient Admitting Diagnosis: SDH History of Present Illness: Cesar [name] is a 87 y.o. right-handed male with history of atrial fibrillation on Eliquis, CKD stage III, CVA, seizure disorder, history of DVT, hyperlipidemia, OSA not on CPAP, ascending thoracic aortic aneurysm followed by Dr. Caffie Pinto, severe aortic stenosis status post TAVR 10/22/2020, nonobstructive CAD.  History taken from chart review and daughter due to [facility].  Patient lives with spouse.  Two-level home bed and bath main level 2 steps to entry.  Independent with assistive device.  He presented on 11/21/2020 after increasing weakness with increasing falls and AMS.  Cranial  CT scan showed large chronic left SDH.  Patient underwent left-sided bur hole x2 placement of subdural drain for evacuation of subdural hematoma on 11/21/2020 per Dr. Duffy Rhody.  Presently maintained on both Depakote as well as Dilantin for seizures.  Repeat head CT personally reviewed, showing improvement in SDH (status post decompression).  Tolerating a regular consistency diet.  Cardene drip initiated for blood pressure control.  His chronic Eliquis currently remains on hold x6 weeks.  Therapy evaluations completed recommendations of physical medicine rehab consult due to altered mental status and multiple falls.  Complete NIHSS TOTAL: 9 Glasgow Coma Scale Score: 15  Past Medical History  Past Medical History:  Diagnosis Date  . AAA (abdominal aortic aneurysm) ([place])   . CKD (chronic kidney disease), stage III (HCC)    baseline Cr 1.4-1.5 in 2016-2017 by PCP)  . CVA (cerebral infarction)   . Depression   . DVT (deep venous thrombosis) ([place]) 2006  . H/O blood clots   . Hematuria   . HOH (hard of hearing)   . Hypercholesterolemia   . OSA (obstructive sleep apnea)  intol to CPAP  . Permanent atrial fibrillation ([place])   . Prostate enlargement   . Pulmonary nodules   . 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 Buena Irish and Dr. Roxy Manns  . Thoracic ascending aortic aneurysm ([place])    4.5cm in 11/2018, stable since 2016  . TIA (transient ischemic attack)     Family History  family history includes Diabetes in his father; Heart failure in his mother; Stroke in his father.  Prior Rehab/Hospitalizations:  Has the patient had prior rehab or hospitalizations prior to admission? Yes and No  Has the patient had major surgery during 100 days prior to admission? Yes  Current Medications   Current Facility-Administered Medications:  .  0.9 %  sodium chloride infusion, , Intravenous, Continuous, [name], MD, Last Rate: 10 mL/hr at 11/26/20 1400, Infusion Verify at 11/26/20 1400 .  acetaminophen (TYLENOL) tablet 650 mg, 650 mg, Oral, Q6H PRN, 650 mg at 11/25/20 0535 **OR** acetaminophen (TYLENOL) suppository 650 mg, 650 mg, Rectal, Q6H PRN, [name], MD .  chlorhexidine (PERIDEX) 0.12 % solution 15 mL, 15 mL, Mouth/Throat, Once **OR** MEDLINE mouth rinse, 15 mL, Mouth Rinse, Once, [name], MD .  divalproex (DEPAKOTE) DR tablet 250 mg, 250 mg, Oral, Q6H, [name], MD, 250 mg at 12/02/20 0501 .  docusate sodium (COLACE) capsule 100 mg, 100 mg, Oral, BID, [name], MD, 100 mg at 12/02/20 4128 .  enalaprilat (VASOTEC) injection 1.25 mg, 1.25 mg, Intravenous, Q6H PRN, Dawley, Troy C, DO, 1.25 mg at 11/23/20 0313 .  finasteride (PROSCAR) tablet 5 mg, 5 mg, Oral, Daily, [name], MD, 5 mg at 12/02/20 7867 .  heparin injection 5,000 Units, 5,000 Units, Subcutaneous, Q12H, [name], MD, 5,000 Units at 12/02/20 [id number] .  labetalol (NORMODYNE) injection 10 mg, 10 mg, Intravenous, Q10 min PRN, [name], MD, 10 mg at 11/23/20 0902 .  morphine 2 MG/ML injection 2 mg, 2 mg, Intravenous, Q2H PRN, [name], MD .  nicardipine (CARDENE) 20mg  in 0.86% saline 28ml IV infusion (0.1 mg/ml), 3-15 mg/hr, Intravenous, Continuous, [name], MD .  ondansetron [facility]) tablet 4 mg, 4 mg, Oral, Q4H PRN **OR** ondansetron (ZOFRAN) injection 4 mg, 4 mg, Intravenous, Q4H PRN, [name], MD .  pantoprazole (PROTONIX) EC tablet 40 mg, 40 mg, Oral, Q supper, Alvira Philips, [place], 40 mg at 12/01/20 1753 .  polyethylene glycol (MIRALAX / GLYCOLAX) packet 17 g, 17 g, Oral, Daily PRN, [name], MD, 17 g at 11/30/20 0831 .  promethazine (PHENERGAN) tablet 12.5-25 mg, 12.5-25 mg, Oral, Q4H PRN, [name], MD .  sodium phosphate (FLEET) 7-19 GM/118ML enema 1 enema, 1 enema, Rectal, Once PRN, [name],  MD  Patients Current Diet:  Diet Order            DIET - DYS 1 Room service appropriate? No; Fluid consistency: Thin  Diet effective now                 Precautions / Restrictions Precautions Precautions: Fall Precaution Comments: pt is inattentive to [place], [place] with hearing aides Restrictions Weight Bearing Restrictions: No Other Position/Activity Restrictions: HOH, does have hearing aides   Has the patient had 2 or more falls or a fall with injury in the past year?Yes  Prior Activity Level Limited Community (1-2x/wk): Pt. went out  to his shop but mostly stayed at home  Prior Functional Level Prior Function Level of Independence: Independent with assistive device(s) Comments: pt reports that he has 12 acres of land and likes riding his machinery on the land. 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[place], [place] with [place]: Did the patient need help bathing, dressing, using the toilet or eating?  Needed some help  Indoor Mobility: Did the patient need assistance with walking from room to room (with or without device)? Independent  Stairs: Did the patient need assistance with internal or external stairs (with or without device)? Needed some help  Functional Cognition: Did the patient need help planning regular tasks such as shopping or remembering to take medications? Needed some help  Home Assistive Devices / [place] Devices/Equipment: Hearing aid Home Equipment: Walker - 2 wheels,Bedside commode,Cane - single point  Prior Device Use: Indicate devices/aids used by the patient prior to current illness, exacerbation or injury? None of the above  Current Functional Level Cognition  Arousal/Alertness: Awake/alert Overall Cognitive Status: Impaired/Different from baseline Difficult to assess due to: Impaired communication Current Attention Level: Sustained Orientation Level: Oriented to person Following Commands: Follows one step  commands consistently Safety/Judgement: Decreased awareness of safety,Decreased awareness of deficits General Comments: no verbal responses, attending some to the R due to wife being on R side. pt needs cues for pocketing. pt reaching to wipe mouth with R side food coming out but just wipes it with hand. Attention: Sustained Sustained Attention: Appears intact Memory: Impaired Memory Impairment: Storage deficit,Retrieval deficit Awareness: Impaired Awareness Impairment: Intellectual impairment,Emergent impairment,Anticipatory impairment Problem Solving: Impaired Problem Solving Impairment: Functional basic,Verbal basic Behaviors: Other (comment) (frustration) Safety/Judgment: Impaired    Extremity Assessment (includes Sensation/Coordination)  Upper Extremity Assessment: RUE deficits/detail RUE Deficits / Details: inattention, wrist drop and not digit activation RUE Sensation: decreased light touch,decreased proprioception RUE Coordination: decreased gross motor,decreased fine motor  Lower Extremity Assessment: Defer to PT evaluation RLE Deficits / Details: grossly 4-/5, requires increased cues and multimodal cues to follow commands related to MMT, pt then able to sustain without buckling in stance RLE Coordination: decreased fine motor    ADLs  Overall ADL's : Needs assistance/impaired Eating/Feeding: Maximal assistance,Sitting Eating/Feeding Details (indicate cue type and reason): hand over hand without activation of wrist or hand noted. Pt does activate shoulder flexion extension adduction abduction, elbow flexion/ extension. pt initiates task with food presented. pt eating 100 % of tray Grooming: Wash/dry face,Brushing hair,Maximal assistance,Sitting Grooming Details (indicate cue type and reason): pt has less initiation of grooming hair due to reaching outside visual attention Upper Body Bathing: Minimal assistance,Sitting Lower Body Bathing: Moderate assistance,Sit to/from  stand Upper Body Dressing : Set up,Min guard,Minimal assistance,Sitting Lower Body Dressing: Moderate assistance,Bed level Lower Body Dressing Details (indicate cue type and reason): pt is unable to open sock and even with hooked sock over toes unable to pull up with hands. pt able to reach but not able to pull up due to fine motor challenges Toilet Transfer: +2 for physical assistance,Minimal assistance,Stand-pivot Toilet Transfer Details (indicate cue type and reason): simulated bed to chair Toileting- Clothing Manipulation and Hygiene: Moderate assistance,Sit to/from stand Functional mobility during ADLs: Minimal assistance,Rolling walker,Moderate assistance,Cueing for sequencing General ADL Comments: pt just transfered to chair with stedy from RN staff. pt positioned in chair and focused on R visual field and R UE    Mobility  Overal bed mobility: Needs Assistance Bed Mobility: Supine to Sit Rolling:  Max assist Supine to sit: Mod assist General bed mobility comments: oob to chair    Transfers  Overall transfer level: Needs assistance Equipment used: Rolling walker (2 wheeled) Transfers: Sit to/from [employer] Sit to Stand: Mod assist Stand pivot transfers: Mod assist General transfer comment: pt requires verbal and visual cues for hand placement. At baseline spouse holds RW and pt pulls up with walker, may be difficult habit to break with new onset neuro deficits    Ambulation / Gait / Stairs / Wheelchair Mobility  Ambulation/Gait Ambulation/Gait assistance: Mod assist Gait Distance (Feet): 15 Feet (additional 10') Assistive device: Rolling walker (2 wheeled) Gait Pattern/deviations: Step-to pattern General Gait Details: pt with difficulty maintaining RUE on RW, often walker drifts to R side as only LUE is applying force to translate forward. This results in maintaining his base of support outside of RW with significant anterior and right lateral lean. PT provides  physical assistance to manage RW Gait velocity: reduced Gait velocity interpretation: <1.31 ft/sec, indicative of household ambulator    Posture / Balance Dynamic Sitting Balance Sitting balance - Comments: minG-minA, posterior lean Balance Overall balance assessment: Needs assistance Sitting-balance support: No upper extremity supported Sitting balance-Leahy Scale: Fair Sitting balance - Comments: minG-minA, posterior lean Postural control: Posterior lean Standing balance support: Bilateral upper extremity supported Standing balance-Leahy Scale: Poor Standing balance comment: reliant on BUE support of RW, minA    Special needs/care consideration Skin surgical incision: head, Continuous IV: 0.9% sodium chloride infusion: 19mL/hr, External urinary catheter and Designated visitor Ruperto Kiernan, wife     Previous Home Environment (from acute therapy documentation) Living Arrangements: Spouse/significant other  Lives With: Spouse Available Help at Discharge: Family,Available 24 hours/day Type of Home: House Home Layout: Two level,Able to live on main level with bedroom/bathroom Home Access: Stairs to enter Entrance Stairs-Rails: Right Entrance Stairs-Number of Steps: 2 Bathroom Shower/Tub: Multimedia programmer: Standard Home Care Services: No Additional Comments: home setup obtained from PT evaluation  Discharge Living Setting Plans for Discharge Living Setting: Patient's home Type of Home at Discharge: House Discharge Home Layout: Two level,Able to live on main level with bedroom/bathroom Discharge Home Access: Stairs to enter Entrance Stairs-Rails: Right Entrance Stairs-Number of Steps: 2 Discharge Bathroom Shower/Tub: Tub/shower unit,Walk-in shower Discharge Bathroom Toilet: Standard Discharge Bathroom Accessibility: No Does the patient have any problems obtaining your medications?: Yes (Describe)  Social/Family/Support Systems Patient Roles: Spouse Contact  Information: [phone] Anticipated Caregiver: Gwenyth Allegra Anticipated Caregiver's Contact Information: [phone] Ability/Limitations of Caregiver: Can provide Min A Caregiver Availability: 24/7 Discharge Plan Discussed with Primary Caregiver: Yes Is Caregiver In Agreement with Plan?: Yes Does Caregiver/Family have Issues with Lodging/Transportation while Pt is in Rehab?: Yes   Goals Patient/Family Goal for Rehab: PT Min A, OT Min A to Supervision, SLP Mod I Expected length of stay: 8-12 days Pt/Family Agrees to Admission and willing to participate: Yes Program Orientation Provided & Reviewed with Pt/Caregiver Including Roles  & Responsibilities: Yes   Decrease burden of Care through IP rehab admission: Specialzed equipment needs, Decrease number of caregivers, Bowel and bladder program and Patient/family education   Possible need for SNF placement upon discharge:not anticipated   Patient Condition: This patient's medical and functional status has changed since the consult dated: 11/22/20 in which the Rehabilitation Physician determined and documented that the patient's condition is appropriate for intensive rehabilitative care in an inpatient rehabilitation facility. See "History of Present Illness" (above) for medical update. Functional changes are: Pt currently Mod A with  transfers and Mod A 15' with gait; Max A with ADLs. Patient's medical and functional status update has been discussed with the Rehabilitation physician and patient remains appropriate for inpatient rehabilitation. Will admit to inpatient rehab today.  Preadmission Screen Completed By: Genella Mech, CCC-SLP with day of admission updates by  Bethel Born, CCC-SLP, 12/02/2020 11:46 AM ______________________________________________________________________   Discussed status with Dr. Naaman Plummer on 12/02/20  at 11:46 AM and received approval for admission today.  Admission Coordinator: Genella Mech with day  of admission updates by  Bethel Born, time 11:46 AM Sudie Grumbling 12/02/20

## 2020-11-23 NOTE — Progress Notes (Signed)
Subjective: The patient is alert, pleasant and hard at hearing.  His daughter is at the bedside.  He is in no apparent distress.  Objective: Vital signs in last 24 hours: Temp:  [98.2 F (36.8 C)-99.1 F (37.3 C)] 98.2 F (36.8 C) (02/19 0400) Pulse Rate:  [34-145] 65 (02/19 0900) Resp:  [14-27] 21 (02/19 0900) BP: (112-162)/(61-123) 143/70 (02/19 0815) SpO2:  [92 %-100 %] 93 % (02/19 0900) Estimated body mass index is 26.02 kg/m as calculated from the following:   Height as of 11/14/20: 6\' 1"  (1.854 m).   Weight as of 11/14/20: 89.4 kg.   Intake/Output from previous day: 02/18 0701 - 02/19 0700 In: 674.9 [P.O.:480; I.V.:21.8; IV Piggyback:173.1] Out: 2744 [Urine:2650; Drains:94] Intake/Output this shift: Total I/O In: -  Out: 9 [Drains:9]  Physical exam the patient is alert and pleasant.  He is moving all 4 extremities.  He answers questions.  I removed his subdural drain.  Lab Results: No results for input(s): WBC, HGB, HCT, PLT in the last 72 hours. BMET No results for input(s): NA, K, CL, CO2, GLUCOSE, BUN, CREATININE, CALCIUM in the last 72 hours.  Studies/Results: CT HEAD WO CONTRAST  Result Date: 11/22/2020 CLINICAL DATA:  Follow-up subdural hematoma EXAM: CT HEAD WITHOUT CONTRAST TECHNIQUE: Contiguous axial images were obtained from the base of the skull through the vertex without intravenous contrast. COMPARISON:  Two days ago FINDINGS: Brain: Interval drainage of subdural hematoma along the left frontal convexity. Drain is present along with remaining fluid and gas. Maximal subdural thickness is 15 mm the midline shift is essentially resolved in the setting of atrophy Remote right frontal infarct. Chronic small vessel ischemia in the cerebral white matter. No hydrocephalus. Vascular: No hyperdense vessel or unexpected calcification. Skull: Left-sided burr holes for drainage. Sinuses/Orbits: No acute finding IMPRESSION: Decompressed subdural hematoma on the left  essentially resolved midline shift. Electronically Signed   By: Monte Fantasia M.D.   On: 11/22/2020 06:05    Assessment/Plan: Postop day #2: The patient is doing well.  Have answered all his daughter's questions.  He is okay for progressive.  LOS: 3 days     Ophelia Charter 11/23/2020, 9:44 AM

## 2020-11-24 NOTE — Progress Notes (Signed)
Patient arrived to 4NP11 in NAD, VS stable and patient free from pain. Assessment done at bedside with RN, My from ICU. Patient has bilateral hearing aids with charger, upper dentures and wrist watch. Daughter at bedside.

## 2020-11-24 NOTE — Progress Notes (Signed)
Subjective: The patient is alert and pleasant and is doing "good".  Objective: Vital signs in last 24 hours: Temp:  [97.4 F (36.3 C)-99.6 F (37.6 C)] 97.9 F (36.6 C) (02/20 0942) Pulse Rate:  [54-143] 82 (02/20 0942) Resp:  [15-26] 19 (02/20 0800) BP: (115-166)/(57-122) 117/78 (02/20 0942) SpO2:  [91 %-99 %] 98 % (02/20 0942) Estimated body mass index is 26.02 kg/m as calculated from the following:   Height as of 11/14/20: 6\' 1"  (1.854 m).   Weight as of 11/14/20: 89.4 kg.   Intake/Output from previous day: 02/19 0701 - 02/20 0700 In: 8.6 [IV Piggyback:8.6] Out: 2109 [Urine:2100; Drains:9] Intake/Output this shift: Total I/O In: 340 [P.O.:240; I.V.:100] Out: -   Physical exam the patient is alert and pleasant. He is moving all 4 extremities.  Lab Results: No results for input(s): WBC, HGB, HCT, PLT in the last 72 hours. BMET No results for input(s): NA, K, CL, CO2, GLUCOSE, BUN, CREATININE, CALCIUM in the last 72 hours.  Studies/Results: No results found.  Assessment/Plan: Postop day #4: The patient is doing well.  LOS: 4 days     Ophelia Charter 11/24/2020, 9:58 AM

## 2020-11-25 ENCOUNTER — Inpatient Hospital Stay (HOSPITAL_COMMUNITY): Payer: Medicare Other

## 2020-11-25 DIAGNOSIS — R531 Weakness: Secondary | ICD-10-CM | POA: Diagnosis not present

## 2020-11-25 DIAGNOSIS — N1832 Chronic kidney disease, stage 3b: Secondary | ICD-10-CM

## 2020-11-25 DIAGNOSIS — S065X9A Traumatic subdural hemorrhage with loss of consciousness of unspecified duration, initial encounter: Secondary | ICD-10-CM | POA: Diagnosis not present

## 2020-11-25 DIAGNOSIS — R9401 Abnormal electroencephalogram [EEG]: Secondary | ICD-10-CM

## 2020-11-25 DIAGNOSIS — I4811 Longstanding persistent atrial fibrillation: Secondary | ICD-10-CM | POA: Diagnosis not present

## 2020-11-25 DIAGNOSIS — R4701 Aphasia: Secondary | ICD-10-CM | POA: Diagnosis not present

## 2020-11-25 LAB — BASIC METABOLIC PANEL
Anion gap: 9 (ref 5–15)
BUN: 33 mg/dL — ABNORMAL HIGH (ref 8–23)
CO2: 27 mmol/L (ref 22–32)
Calcium: 8.8 mg/dL — ABNORMAL LOW (ref 8.9–10.3)
Chloride: 99 mmol/L (ref 98–111)
Creatinine, Ser: 1.54 mg/dL — ABNORMAL HIGH (ref 0.61–1.24)
GFR, Estimated: 43 mL/min — ABNORMAL LOW (ref >60)
Glucose, Bld: 127 mg/dL — ABNORMAL HIGH (ref 70–99)
Potassium: 4.4 mmol/L (ref 3.5–5.1)
Sodium: 135 mmol/L (ref 135–145)

## 2020-11-25 LAB — CBC WITH DIFFERENTIAL/PLATELET
Abs Immature Granulocytes: 0.03 10*3/uL (ref 0.00–0.07)
Basophils Absolute: 0 10*3/uL (ref 0.0–0.1)
Basophils Relative: 1 %
Eosinophils Absolute: 0.3 10*3/uL (ref 0.0–0.5)
Eosinophils Relative: 4 %
HCT: 40.4 % (ref 39.0–52.0)
Hemoglobin: 12.9 g/dL — ABNORMAL LOW (ref 13.0–17.0)
Immature Granulocytes: 1 %
Lymphocytes Relative: 15 %
Lymphs Abs: 1 10*3/uL (ref 0.7–4.0)
MCH: 31.4 pg (ref 26.0–34.0)
MCHC: 31.9 g/dL (ref 30.0–36.0)
MCV: 98.3 fL (ref 80.0–100.0)
Monocytes Absolute: 0.6 10*3/uL (ref 0.1–1.0)
Monocytes Relative: 9 %
Neutro Abs: 4.5 10*3/uL (ref 1.7–7.7)
Neutrophils Relative %: 70 %
Platelets: 132 10*3/uL — ABNORMAL LOW (ref 150–400)
RBC: 4.11 MIL/uL — ABNORMAL LOW (ref 4.22–5.81)
RDW: 13 % (ref 11.5–15.5)
WBC: 6.4 10*3/uL (ref 4.0–10.5)
nRBC: 0 % (ref 0.0–0.2)

## 2020-11-25 LAB — BLOOD GAS, VENOUS
Acid-base deficit: 0.1 mmol/L (ref 0.0–2.0)
Bicarbonate: 23.4 mmol/L (ref 20.0–28.0)
FIO2: 21
O2 Saturation: 94.9 %
Patient temperature: 37.7
pCO2, Ven: 35.4 mmHg — ABNORMAL LOW (ref 44.0–60.0)
pH, Ven: 7.44 — ABNORMAL HIGH (ref 7.250–7.430)
pO2, Ven: 77.5 mmHg — ABNORMAL HIGH (ref 32.0–45.0)

## 2020-11-25 LAB — URINALYSIS, ROUTINE W REFLEX MICROSCOPIC
Bilirubin Urine: NEGATIVE
Glucose, UA: NEGATIVE mg/dL
Hgb urine dipstick: NEGATIVE
Ketones, ur: NEGATIVE mg/dL
Nitrite: NEGATIVE
Protein, ur: NEGATIVE mg/dL
Specific Gravity, Urine: 1.017 (ref 1.005–1.030)
pH: 6 (ref 5.0–8.0)

## 2020-11-25 MED ORDER — VALPROATE SODIUM 100 MG/ML IV SOLN
125.0000 mg | Freq: Four times a day (QID) | INTRAVENOUS | Status: DC
  Administered 2020-11-25 – 2020-11-26 (×2): 125 mg via INTRAVENOUS
  Filled 2020-11-25 (×4): qty 1.25

## 2020-11-25 MED ORDER — DEXAMETHASONE SODIUM PHOSPHATE 4 MG/ML IJ SOLN
4.0000 mg | Freq: Three times a day (TID) | INTRAMUSCULAR | Status: AC
  Administered 2020-11-25 – 2020-11-26 (×3): 4 mg via INTRAVENOUS
  Filled 2020-11-25 (×3): qty 1

## 2020-11-25 MED ORDER — DEXAMETHASONE SODIUM PHOSPHATE 4 MG/ML IJ SOLN
1.0000 mg | Freq: Two times a day (BID) | INTRAMUSCULAR | Status: AC
  Administered 2020-11-28 – 2020-11-29 (×2): 1 mg via INTRAVENOUS
  Filled 2020-11-25 (×2): qty 1

## 2020-11-25 MED ORDER — DEXAMETHASONE SODIUM PHOSPHATE 4 MG/ML IJ SOLN
2.0000 mg | Freq: Two times a day (BID) | INTRAMUSCULAR | Status: AC
  Administered 2020-11-27 – 2020-11-28 (×2): 2 mg via INTRAVENOUS
  Filled 2020-11-25 (×2): qty 1

## 2020-11-25 MED ORDER — SULFAMETHOXAZOLE-TRIMETHOPRIM 400-80 MG/5ML IV SOLN: 160.0000 mg | Freq: Two times a day (BID) | INTRAVENOUS | Status: DC

## 2020-11-25 MED ORDER — HEPARIN SODIUM (PORCINE) 5000 UNIT/ML IJ SOLN
5000.0000 [IU] | Freq: Two times a day (BID) | INTRAMUSCULAR | Status: DC
  Administered 2020-11-26 – 2020-12-02 (×13): 5000 [IU] via SUBCUTANEOUS
  Filled 2020-11-25 (×13): qty 1

## 2020-11-25 MED ORDER — SULFAMETHOXAZOLE-TRIMETHOPRIM 800-160 MG PO TABS
1.0000 | ORAL_TABLET | Freq: Two times a day (BID) | ORAL | Status: DC
  Administered 2020-11-25: 1 via ORAL
  Filled 2020-11-25 (×2): qty 1

## 2020-11-25 MED ORDER — DEXAMETHASONE SODIUM PHOSPHATE 4 MG/ML IJ SOLN
1.0000 mg | INTRAMUSCULAR | Status: AC
Start: 2020-11-29 — End: 2020-11-29
  Administered 2020-11-29: 1 mg via INTRAVENOUS
  Filled 2020-11-25: qty 1

## 2020-11-25 MED ORDER — DEXAMETHASONE SODIUM PHOSPHATE 4 MG/ML IJ SOLN
2.0000 mg | Freq: Three times a day (TID) | INTRAMUSCULAR | Status: AC
  Administered 2020-11-26 – 2020-11-27 (×3): 2 mg via INTRAVENOUS
  Filled 2020-11-25 (×3): qty 1

## 2020-11-25 MED ORDER — HEPARIN SODIUM (PORCINE) 5000 UNIT/ML IJ SOLN
5000.0000 [IU] | Freq: Two times a day (BID) | INTRAMUSCULAR | Status: DC
  Administered 2020-11-25: 5000 [IU] via SUBCUTANEOUS
  Filled 2020-11-25: qty 1

## 2020-11-25 NOTE — Consult Note (Signed)
[facility] Medical Consultation  Cesar [name] AYT:[id number] DOB: 1929/08/05 DOA: 11/19/2020 PCP: Cesar Melter, MD   Requesting physician: Cesar [name] Date of consultation: 11/25/2020  Reason for consultation: acute encephalopathy UTI  Impression/Recommendations Principal Problem:   SDH (subdural hematoma) ([place]) Active Problems:   Hypercholesterolemia   CKD (chronic kidney disease), stage III ([place])   Atrial fibrillation ([place])   Frequent falls   Subdural bleeding ([place])   Seizures ([place])   History of CVA (cerebrovascular accident)   History of DVT (deep vein thrombosis)   OSA (obstructive sleep apnea)   Thrombocytopenia ([place])    1. Acute right side profound weakness and new onset aphasia - At baseline speech was dysarthric but was comprehendible, at baseline has some degree of weakness right upper and lower extremity Initial onset at 11 AM, CT head initially showing stable amount of fluid in left subdural area no acute hemorrhage, per neurosurgery this actually looked better than prior EEG was also done showing no seizure activity UA was obtained that showed slight increase in leukocytes but no increase in white blood cell count no nitrites no bacteria He was empirically started on Bactrim for possible UTI Apparently since patient symptoms did not improve and his creatinine noted to increase Hospitalist was called for consult On my evaluation patient still has persistent significant right side profound weakness and continues to be aphasic Discussed with neurology   Will obtain MRI/MRA  Brain - preliminary read showing no CVA Or significant neurological weakness could be fluctuating in the setting of recent subdural hematoma Defer to primary team for further management  Patient does have history of A. fib his Eliquis had to be held secondary to subdural hematoma  After extensive exam it appears that patient is alert and oriented but aphasic this presentation is not  consistent with UTI but rather suggestive of neurological changes  Given abnormal urine urine culture has been ordered but at this point it is not consistent with UTI will stop Bactrim as this can artificially elevate creatinine Patient does have history of seizures will check Depakote level  2. CKD -  -chronic avoid nephrotoxic medications such as NSAIDs, Vanco Zosyn combo,  avoid hypotension, continue to follow renal function after CTA Rehydrate as able   3.  History of atrial fibrillation-Per neurosurgery will need to hold Eliquis for 6 weeks  4.  Subdural hematoma as per primary team  5. History of seizures check Depakote level continue home medications changed to IV  6. Aphasia will need speech pathology evaluation as patient likely developed dysphagia as well, keep NPO for now   [facility]  Team will followup again tomorrow. Please contact me if I can be of assistance in the meanwhile. Thank you for this consultation.  Chief Complaint:  Chief Complaint  Patient presents with  . Weakness   HPI: 87 year old right-handed male with history of atrial fibrillation maintained on Eliquis, CKD stage III, CVA, seizure disorder maintained on Depakote, history of DVT, hyperlipidemia, OSA not on CPAP, ascending thoracic aortic aneurysm followed by Cesar [name], severe aortic stenosis status post TAVR procedure 10/22/2020, nonobstructive CAD.   Increase initially presented on 15 February with repeated falls and acute mental status CT head showed large chronic left subdural Patient undergone left-sided bur hole x2 in subdural drain for evacuation of a hematoma done on 17th by Cesar [name]. CT scan on 18 showed decompression subdural hematoma on the left head resolved  Of note today patient started on subcu heparin for DVT prophylaxis He  is usually supposed to be on chronic Eliquis but he has to be on hold for next 6 weeks secondary to SDH His blood pressure has been controlled with Cardene  drip and now its been discontinued Initial plan for patient was to go to rehab facility Until today he had worsening mental status changes with possible focal right-sided worsening weakness NUrsing stuff first noted around 11 AM that Patient was having expressive aphasia had right facial droop, profound right side weakness and could not eat. Primary team was notified CT head ordered and was stable EEG - showed no acute seizure events    Review of Systems:,   Pertinent positives include:    Constitutional:  No weight loss, night sweats, Fevers, chills, weight loss fatigue  HEENT:  No headaches, Difficulty swallowing,Tooth/dental problems,Sore throat,  No sneezing, itching, ear ache, nasal congestion, post nasal drip,  Cardio-vascular:  No chest pain, Orthopnea, PND, anasarca, dizziness, palpitations.no Bilateral lower extremity swelling  GI:  No heartburn, indigestion, abdominal pain, nausea, vomiting, diarrhea, change in bowel habits, loss of appetite, melena, blood in stool, hematemesis Resp:  No shortness of breath at rest. No dyspnea on exertion,No excess mucus, no productive cough, No non-productive cough, No coughing up of blood.No change in color of mucus.No wheezing. Skin:  no rash or lesions. No jaundice GU:  no dysuria, change in color of urine, no urgency or frequency. No straining to urinate.  No flank pain.  Musculoskeletal:  No joint pain or no joint swelling. No decreased range of motion. No back pain.  Psych:  No change in mood or affect. No depression or anxiety. No memory loss.  Neuro: no localizing neurological complaints, no tingling, no weakness, no double vision, no gait abnormality, no slurred speech, no confusion   All systems reviewed and apart from [place] all are negative  Past Medical History:  Diagnosis Date  . AAA (abdominal aortic aneurysm) ([place])   . CKD (chronic kidney disease), stage III ([facility])    baseline Cr 1.4-1.5 in 2016-2017 by PCP)  . CVA  (cerebral infarction)   . Depression   . DVT (deep venous thrombosis) ([place]) 2006  . H/O blood clots   . Hematuria   . HOH (hard of hearing)   . Hypercholesterolemia   . OSA (obstructive sleep apnea)    intol to CPAP  . Permanent atrial fibrillation ([place])   . Prostate enlargement   . Pulmonary nodules   . 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 Buena Irish and Dr. Roxy Manns  . Thoracic ascending aortic aneurysm ([place])    4.5cm in 11/2018, stable since 2016  . TIA (transient ischemic attack)    Past Surgical History:  Procedure Laterality Date  . BURR HOLE Left 11/21/2020   Procedure: LEFT Side BURR HOLES,;  Surgeon: [name], MD;  Location: [facility];  Service: Neurosurgery;  Laterality: Left;  . EMBOLECTOMY Left 07/02/2016   Procedure: EMBOLECTOMY LEFT FEMORAL ARTERY;  Surgeon: [name], MD;  Location: [facility];  Service: Vascular;  Laterality: Left;  Marland Kitchen MESENTERIC ARTERY BYPASS N/A 08/06/2019   Procedure: SUPERIOR MESENTERIC ARTERY THROMBECTOMY;  Surgeon: [name], MD;  Location: [facility];  Service: Vascular;  Laterality: N/A;  . NM [place]  08/24/2002   markedly positive,subtle lateral ischemia  . PATCH ANGIOPLASTY  08/06/2019   Procedure: Patch Angioplasty of superior messenteric artery with RIGHT femoral vein;  Surgeon: [name], MD;  Location: [facility];  Service: Vascular;;  . PROSTATE SURGERY    . RIGHT HEART CATH AND CORONARY ANGIOGRAPHY N/A 10/21/2020   Procedure: RIGHT HEART CATH AND CORONARY ANGIOGRAPHY;  Surgeon: [name], MD;  Location: [facility];  Service: Cardiovascular;  Laterality: N/A;  . TEE WITHOUT CARDIOVERSION N/A 10/22/2020   Procedure: TRANSESOPHAGEAL ECHOCARDIOGRAM (TEE);  Surgeon: [name], MD;  Location: [facility];  Service: Open Heart Surgery;  Laterality: N/A;  . TRANSCATHETER AORTIC VALVE REPLACEMENT, TRANSFEMORAL  Right 10/22/2020   Procedure: TRANSCATHETER AORTIC VALVE REPLACEMENT, TRANSFEMORAL;  Surgeon: [name], MD;  Location: [facility];  Service: Open Heart Surgery;  Laterality: Right;  . US ECHOCARDIOGRAPHY  01/06/2012   mod. AOV ca+,mild to mod. AS,mod mostly posterior MAC-mild MR   Social History:  reports that he quit smoking about 68 years ago. His smoking use included cigarettes. He has a 10.00 pack-year smoking history. He has never used smokeless tobacco. He reports previous alcohol use. He reports that he does not use drugs.  Allergies  Allergen Reactions  . Flomax [Tamsulosin Hcl] Other (See Comments)    Not able to urinate  . Levetiracetam Other (See Comments)    Unknown reaction   Family History  Problem Relation Age of Onset  . Heart failure Mother   . Diabetes Father   . Stroke Father     Prior to Admission medications   Medication Sig Start Date End Date Taking? Authorizing Provider  acetaminophen (TYLENOL) 500 MG tablet Take 500 mg by mouth every 8 (eight) hours as needed for mild pain.   Yes [provider]  amoxicillin (AMOXIL) 500 MG tablet Take 4 tablets (2000 mg) one hour prior to all dental visits. 10/30/20  Yes Eileen Stanford, PA-C  apixaban (ELIQUIS) 5 MG TABS tablet Take 5 mg by mouth 2 (two) times daily. 07/24/20  Yes [provider]  aspirin EC 81 MG tablet Take 81 mg by mouth daily. Swallow whole.   Yes [provider]  atorvastatin (LIPITOR) 80 MG tablet Take 80 mg by mouth daily.   Yes [provider]  b complex vitamins capsule Take 1 capsule by mouth daily.   Yes [provider]  divalproex (DEPAKOTE ER) 500 MG 24 hr tablet Take 1 tablet (500 mg total) by mouth at bedtime. 08/13/20  Yes Suzzanne Cloud, NP  finasteride (PROSCAR) 5 MG tablet Take 5 mg by mouth daily.   Yes [provider]  Rivaroxaban (XARELTO) 15 MG TABS tablet Take 1 tablet (15 mg total) by mouth daily with supper. Patient not  taking: No sig reported 11/01/20   Croitoru, Mihai, MD   Physical Exam: Blood pressure (!) 132/92, pulse 100, temperature (!) 97.4 F (36.3 C), resp. rate 20, SpO2 95 %. Vitals:   11/25/20 1200 11/25/20 1547  BP: 140/67 (!) 132/92  Pulse: 62 100  Resp: 20 20  Temp: 98.4 F (36.9 C) (!) 97.4 F (36.3 C)  SpO2: 97% 95%     1. General:  in No Acute distress    Chronically ill -appearing  2. Psychological: Alert and  Oriented  3. Head/ENT:   Moist  Mucous Membranes                           Head Non traumatic, neck supple  Poor Dentition  4. SKIN:  decreased Skin turgor,  Skin clean Dry and intact no rash  5. Heart: Regular rate and rhythm no  Murmur, no Rub or gallop  6. Lungs:  no wheezes or crackles    7. Abdomen: Soft,  non-tender, Non distended  bowel sounds present  8. Lower extremities: no clubbing, cyanosis, or  edema  9. Neurologically  flaccid paralysis on the right extremities, right facial droop  10. MSK: Normal range of motion  Labs on Admission:  Basic Metabolic Panel: Recent Labs  Lab 11/19/20 1751 11/19/20 1818 11/25/20 1232  NA 137 139 135  K 4.7 4.8 4.4  CL 104 104 99  CO2 25  --  27  GLUCOSE 123* 119* 127*  BUN 20 24* 33*  CREATININE 1.34* 1.40* 1.54*  CALCIUM 8.9  --  8.8*   Liver Function Tests: Recent Labs  Lab 11/19/20 1751  AST 28  ALT 22  ALKPHOS 59  BILITOT 0.9  PROT 6.6  ALBUMIN 3.1*   No results for input(s): LIPASE, AMYLASE in the last 168 hours. No results for input(s): AMMONIA in the last 168 hours. CBC: Recent Labs  Lab 11/19/20 1751 11/19/20 1818 11/20/20 0319 11/25/20 1232  WBC 6.8  --  6.6 6.4  NEUTROABS 5.1  --   --  4.5  HGB 12.3* 12.2* 11.6* 12.9*  HCT 36.9* 36.0* 35.3* 40.4  MCV 97.6  --  97.8 98.3  PLT 114*  --  107* 132*   Cardiac Enzymes: Recent Labs  Lab 11/20/20 0319  CKTOTAL 56   BNP: Invalid input(s): POCBNP CBG: Recent Labs  Lab 11/20/20 2135  11/21/20 0615 11/21/20 1213 11/21/20 1948  GLUCAP 141* 93 98 123*    Radiological Exams on Admission: CT HEAD WO CONTRAST  Result Date: 11/25/2020 CLINICAL DATA:  Altered mental status. EXAM: CT HEAD WITHOUT CONTRAST TECHNIQUE: Contiguous axial images were obtained from the base of the skull through the vertex without intravenous contrast. COMPARISON:  November 22, 2020. FINDINGS: Brain: Status post drainage of left subdural hematoma. There is decreased pneumocephalus present in the operative site. However, a stable amount of hemorrhage and fluid remains in the left subdural area compared to prior exam. Stable minimal left-to-right midline shift of 4 mm is noted. Ventricular size is within normal limits. Diffuse cortical atrophy is noted. Mild chronic ischemic white matter disease is noted. No new hemorrhage is noted. Vascular: No hyperdense vessel or unexpected calcification. Skull: Continued presence of 2 left-sided craniotomies for decompression of hematoma. Sinuses/Orbits: No acute finding. Other: None. IMPRESSION: 1. Status post drainage of left subdural hematoma. A stable amount of hemorrhage and fluid remains in the left subdural area compared to prior exam. Stable minimal left-to-right midline shift of 4 mm is noted. 2. No new hemorrhage is noted. Diffuse cortical atrophy is noted. Mild chronic ischemic white matter disease is noted. Electronically Signed   By: Marijo Conception M.D.   On: 11/25/2020 13:47   Portable EEG  Result Date: 11/25/2020 Lora Havens, MD     11/25/2020  4:57 PM Patient Name: Cesar [name] MRN: [id number] Epilepsy Attending: Lora Havens Referring Physician/Provider: Dr. Duffy Rhody Date: 11/25/2020 Duration: 25.05 mins Patient history: 87 year old male with left subdural hematoma.  EEG to evaluate for seizures. Level of alertness: lethargic AEDs during EEG study: Depakote Technical aspects: This EEG study was done with scalp electrodes positioned according to  the 10-20 International system of electrode placement. Electrical activity was acquired at a  sampling rate of _0  and reviewed with a high frequency filter of _1  and a low frequency filter of _2 . EEG data were recorded continuously and digitally stored. Description: EEG showed continuous generalized 6 to 9 Hz theta-alpha activity in the right hemisphere as well as 2 to 3 Hz rhythmic delta slowing in left hemisphere which at times appears sharply contoured. Hyperventilation and photic stimulation were not performed.   ABNORMALITY -Continuous slow, generalized and lateralized left hemisphere IMPRESSION: This study is suggestive of cortical dysfunction in left hemisphere likely secondary to underlying hematoma as well as moderate diffuse encephalopathy, nonspecific etiology. No seizures or definite epileptiform discharges were seen throughout the recording. Priyanka Barbra Sarks    EKG: Independently reviewed.   A.fib Hr 103 no ischemic changes  Time spent: 75 min  Toy Baker [facility] Pager [phone]  If 7PM-7AM, please contact night-coverage [email or web address] Password [facility] 11/25/2020, 7:39 PM

## 2020-11-25 NOTE — Progress Notes (Signed)
Subjective: Patient reports no headaches  Objective: Vital signs in last 24 hours: Temp:  [97.3 F (36.3 C)-98.8 F (37.1 C)] 98.5 F (36.9 C) (02/21 0351) Pulse Rate:  [67-97] 79 (02/21 0351) Resp:  [17-22] 22 (02/21 0351) BP: (101-136)/(62-78) 112/64 (02/21 0351) SpO2:  [94 %-99 %] 94 % (02/21 0351)  Intake/Output from previous day: 02/20 0701 - 02/21 0700 In: 1060 [P.O.:960; I.V.:100] Out: 1050 [Urine:1050] Intake/Output this shift: No intake/output data recorded.  Awake, alert Oriented to person, month, hospital. Speech dysarthric, though likely due to dentures not being in proper position R pronator drift Dressing c/d  Lab Results: No results for input(s): WBC, HGB, HCT, PLT in the last 72 hours. BMET No results for input(s): NA, K, CL, CO2, GLUCOSE, BUN, CREATININE, CALCIUM in the last 72 hours.  Studies/Results: No results found.  Assessment/Plan: S/p drainage of chronic SDH - likely CIR candidate - hold anticoagulation - will need repeat CT in 4 weeks to see if he can resume anticoagulation - hold aspirin for 2 weeks   LOS: 5 days     Cesar [name] 11/25/2020, 9:04 AM

## 2020-11-25 NOTE — Progress Notes (Signed)
Inpatient Rehab Admissions Coordinator:   I do not have a CIR bed for this patient today. I confirmed with pt.'s daughter that they are interested in rehab for pt. And Dr. [name] confirmed that pt. Is medically ready whenever a bed becomes available. Pt.'s daughter expressed concerns that Pt. Is less verbally responsive today. I relayed this concern to Dr. [name].   Clemens Catholic, [place], [place] Admissions Coordinator  [phone] ([place]) [phone] (office)

## 2020-11-25 NOTE — Progress Notes (Signed)
EEG completed, results pending. 

## 2020-11-25 NOTE — Plan of Care (Signed)

## 2020-11-25 NOTE — Progress Notes (Signed)
Patients daughter concerned about patient being more confused and not responding to her like he has been. Patients attending Dr. [name] was notified and has already seen this morning and he was fine and answered questions during exam. Will continue to monitor patient for any changes.   Rmani Kellogg, Tivis Ringer, RN

## 2020-11-25 NOTE — Progress Notes (Signed)
Physical Therapy Treatment Patient Details Name: Cesar [name] MRN: [id number] DOB: 1929-06-15 Today's Date: 11/25/2020    History of Present Illness 87 y.o. male with medical history significant of permanent A. fib on Eliquis, CKD stage III, CVA, seizure disorder, depression, history of DVT, hyperlipidemia, OSA not on CPAP, AAA, ascending thoracic aortic aneurysm followed by Dr. [name], severe aortic stenosis status post TAVR 10/22/2020, nonobstructive CAD presenting to the ED via EMS for evaluation of weakness and multiple falls. Head CT showing acute on chronic left cerebral convexity subdural hematoma. Pt s/p L sided burr hole x2 and placement of subdural drain for evacuation of SDH 11/21/20    PT Comments    Pt in bed upon arrival of PT with daughter present, pt significantly less verbose, but nodded in agreement to session. Pt required significantly increased cueing for all mobility and sequencing, especially with R-sided extremities this morning. He required modA to manage RLE and RUE movement in bed, as well as significantly increased time and cueing to complete transition to sitting EOB. The pt required significantly increased assist to power up to standing and maintain stability in static stance. He was unable to initiate any steps at this time despite facilitation of wt shift at hips and increased verbal and tactile cues. The pt is presenting with new physical deficits in strength, motor planning, coordination, and inattention in addition to decreased cognition from prior evaluation (see cognition section below). Continue to recommend intensive therapies acutely and following d/c.    Follow Up Recommendations  CIR;Supervision for mobility/OOB     Equipment Recommendations  Wheelchair (measurements PT);Wheelchair cushion (measurements PT);Hospital bed (if d/c home)    Recommendations for Other Services       Precautions / Restrictions Precautions Precautions: Fall Precaution  Comments: R inattention HOH with hearing aides Drain L side of head Restrictions Weight Bearing Restrictions: No    Mobility  Bed Mobility Overal bed mobility: Needs Assistance Bed Mobility: Rolling;Supine to Sit Rolling: Mod assist   Supine to sit: Mod assist;HOB elevated     General bed mobility comments: HOB elevated slightly, pt needing cues and physical assist for all sequencing, movement of BLE, but especially R-sided extremities. Pt able to push up from elevated HOB, but required assist to steady    Transfers Overall transfer level: Needs assistance Equipment used: 2 person hand held assist Transfers: Sit to/from [facility] Sit to Stand: Mod assist;+2 physical assistance Stand pivot transfers: Max assist;+2 physical assistance       General transfer comment: pt requires increased time and significant assist to power up, steady, and max cues to maintain upright position/posture. no knee buckling. pt unable to initiate any steps, even with facilitation of wt shift, and requirted maxA to pivot to recliner.  Ambulation/Gait             General Gait Details: pt unable to initiate any steps at this time   Modified Rankin (Stroke Patients Only) Modified Rankin (Stroke Patients Only) Pre-Morbid Rankin Score: Moderate disability Modified Rankin: Severe disability     Balance Overall balance assessment: Needs assistance Sitting-balance support: Bilateral upper extremity supported;Feet supported Sitting balance-Leahy Scale: Poor Sitting balance - Comments: reliant on SUE and minA to maintain Postural control: Posterior lean;Right lateral lean Standing balance support: Bilateral upper extremity supported Standing balance-Leahy Scale: Poor Standing balance comment: reliant on modA of 2 to maintain upright  Cognition Arousal/Alertness: Lethargic Behavior During Therapy: Flat affect Overall Cognitive Status:  Impaired/Different from baseline Area of Impairment: Orientation;Memory;Following commands;Safety/judgement;Awareness;Problem solving                 Orientation Level: Disoriented to;Place;Time;Situation   Memory: Decreased short-term memory Following Commands: Follows one step commands inconsistently;Follows one step commands with increased time Safety/Judgement: Decreased awareness of safety;Decreased awareness of deficits   Problem Solving: Slow processing;Decreased initiation;Difficulty sequencing;Requires verbal cues;Requires tactile cues General Comments: pt with significant decrease in awareness, interaction, speech, and orientation at this session. He was unable to follow simple commands without increased processing time, verbal, and tactile cues. The pt was also unable to use speech at this time, using inconsistent yes/no nods when cued. Pt inaccurately answering simple yes/no questions such as "Is this your son?" or "did you serve in the [employer]?"      Exercises General Exercises - Lower Extremity Long Arc Quad: AAROM;Both;10 reps Hip Flexion/Marching: AAROM;Both;10 reps;Seated    General Comments General comments (skin integrity, edema, etc.): VSS on RA, but pt with significant change in cognition and speech during this session compared to prior eval. RN alerted who has also alerted MD.      Pertinent Vitals/Pain Pain Assessment: Faces Faces Pain Scale: Hurts a little bit Pain Location: head Pain Descriptors / Indicators: Grimacing Pain Intervention(s): Monitored during session;Repositioned           PT Goals (current goals can now be found in the care plan section) Acute Rehab PT Goals Patient Stated Goal: none stated PT Goal Formulation: With patient Time For Goal Achievement: 12/06/20 Potential to Achieve Goals: Good Progress towards PT goals: Not progressing toward goals - comment (decreased cognition and mobility)    Frequency    Min 4X/week       PT Plan Current plan remains appropriate       AM-PAC PT "6 Clicks" Mobility   Outcome Measure  Help needed turning from your back to your side while in a flat bed without using bedrails?: A Little Help needed moving from lying on your back to sitting on the side of a flat bed without using bedrails?: A Lot Help needed moving to and from a bed to a chair (including a wheelchair)?: A Lot Help needed standing up from a chair using your arms (e.g., wheelchair or bedside chair)?: A Lot Help needed to walk in hospital room?: Total Help needed climbing 3-5 steps with a railing? : Total 6 Click Score: 11    End of Session Equipment Utilized During Treatment: Gait belt Activity Tolerance: Patient tolerated treatment well Patient left: in chair;with call bell/phone within reach;with chair alarm set;with family/visitor present Nurse Communication: Mobility status PT Visit Diagnosis: Unsteadiness on feet (R26.81);Other abnormalities of gait and mobility (R26.89);Muscle weakness (generalized) (M62.81);Other symptoms and signs involving the nervous system (R29.898)     Time: 1001-1040 PT Time Calculation (min) (ACUTE ONLY): 39 min  Charges:  $Therapeutic Exercise: 8-22 mins $Therapeutic Activity: 23-37 mins                     Karma Ganja, PT, DPT   Acute Rehabilitation Department Pager #: [phone]   Otho Bellows 11/25/2020, 11:06 AM

## 2020-11-25 NOTE — Anesthesia Postprocedure Evaluation (Signed)
Anesthesia Post Note  Patient: Cesar [name]  Procedure(s) Performed: LEFT Side BURR HOLES, (Left Head)     Patient location during evaluation: PACU Anesthesia Type: General Level of consciousness: awake and alert Pain management: pain level controlled Vital Signs Assessment: post-procedure vital signs reviewed and stable Respiratory status: spontaneous breathing, nonlabored ventilation, respiratory function stable and patient connected to nasal cannula oxygen Cardiovascular status: blood pressure returned to baseline and stable Postop Assessment: no apparent nausea or vomiting Anesthetic complications: no   No complications documented.  Last Vitals:  Vitals:   11/24/20 2301 11/25/20 0351  BP: 102/71 112/64  Pulse: 97 79  Resp: 17 (!) 22  Temp: 37.1 C 36.9 C  SpO2: 96% 94%    Last Pain:  Vitals:   11/25/20 0620  TempSrc:   PainSc: [place]

## 2020-11-25 NOTE — Progress Notes (Signed)
Patient unable to verbalize words or squeeze with right hand. Daughter at bedside trying to feed patient he's coughing and drooling advised daughter to hold off on feeding him and called attending MD again. New orders for CT scan given.   Tressy Kunzman, Tivis Ringer, Therapist, sports  .

## 2020-11-25 NOTE — Progress Notes (Signed)
Patient placed NPO today at 1719, paged Neurosurgery at 2100 for 2200 oral medications to be switched to IV route.  Received verbal order from [place] NP to switch Depakote and Bactrim to IV route.  Placed Bactrim order for IV and called pharmacy to change Depakote due to not being able to complete order due to Depacon shortage.  Depacon order placed by pharmacy but Bactrim oral and IV orders discontinued by A. Doutova MD so Bactrim not given.  Depacon administered at 2236.

## 2020-11-25 NOTE — Procedures (Signed)
Patient Name: Cesar [name]  MRN: [id number]  Epilepsy Attending: Lora Havens  Referring Physician/Provider: Dr. Duffy Rhody Date: 11/25/2020 Duration: 25.05 mins  Patient history: 87 year old male with left subdural hematoma.  EEG to evaluate for seizures.  Level of alertness: lethargic  AEDs during EEG study: Depakote  Technical aspects: This EEG study was done with scalp electrodes positioned according to the 10-20 International system of electrode placement. Electrical activity was acquired at a sampling rate of 500Hz  and reviewed with a high frequency filter of 70Hz  and a low frequency filter of 1Hz . EEG data were recorded continuously and digitally stored.   Description: EEG showed continuous generalized 6 to 9 Hz theta-alpha activity in the right hemisphere as well as 2 to 3 Hz rhythmic delta slowing in left hemisphere which at times appears sharply contoured. Hyperventilation and photic stimulation were not performed.     ABNORMALITY -Continuous slow, generalized and lateralized left hemisphere  IMPRESSION: This study is suggestive of cortical dysfunction in left hemisphere likely secondary to underlying hematoma as well as moderate diffuse encephalopathy, nonspecific etiology. No seizures or definite epileptiform discharges were seen throughout the recording.  Rosann Gorum Barbra Sarks

## 2020-11-25 NOTE — Telephone Encounter (Signed)
Patient denied patient assistance for Xarelto. Currently admitted for subdural hematoma and off of anticoagulation

## 2020-11-25 NOTE — H&P (Incomplete)
Physical Medicine and Rehabilitation Admission H&P    Chief Complaint  Patient presents with  . Weakness  : HPI: Cesar [name]. Cesar [name] is an 87 year old right-handed male with history of atrial fibrillation maintained on Eliquis, CKD stage III, CVA, seizure disorder maintained on Depakote, history of DVT, hyperlipidemia, OSA not on CPAP, ascending thoracic aortic aneurysm followed by Dr. Caffie Pinto, severe aortic stenosis status post TAVR procedure 10/22/2020, nonobstructive CAD.  Per chart review patient lives with spouse.  Two-level home bed and bath main level 2 steps to entry.  Independent with assistive device.  He does have a daughter with good support.  Presented 11/19/2020 after increasing weakness with frequent falls and altered mental status.  Admission chemistries unremarkable except glucose 123, creatinine 1.34, hemoglobin 12.3, valproic acid level 26.  Cranial CT scan showed large chronic left SDH.  Patient underwent left-sided bur hole x2 placement of subdural drain for evacuation of subdural hematoma on 11/21/2020 per Dr. Duffy Rhody.  He remains on Depakote as prior to admission.  Follow-up cranial CT scan 11/22/2020 shows decompressed subdural hematoma on the left essentially resolved.  He was cleared to begin subcutaneous heparin for DVT prophylaxis 11/25/2020.  His chronic Eliquis remains on hold for approximately 6 weeks secondary to SDH.  Close monitoring of blood pressure initially on Cardene drip since discontinued.  He is tolerating a regular consistency diet.  Therapy evaluations completed due to patient's altered mental status decreased functional mobility was admitted for a comprehensive rehab program.  Review of Systems  Constitutional: Negative for chills and fever.  HENT: Positive for hearing loss.   Eyes: Negative for blurred vision and double vision.  Respiratory: Positive for shortness of breath. Negative for cough.   Cardiovascular: Positive for leg swelling. Negative for  chest pain and palpitations.  Gastrointestinal: Positive for constipation. Negative for heartburn, nausea and vomiting.  Genitourinary: Positive for hematuria. Negative for dysuria and flank pain.  Musculoskeletal: Positive for falls, joint pain and myalgias.  Skin: Negative for rash.  Neurological: Positive for weakness.  All other systems reviewed and are negative.  Past Medical History:  Diagnosis Date  . AAA (abdominal aortic aneurysm) ([place])   . CKD (chronic kidney disease), stage III ([facility])    baseline Cr 1.4-1.5 in 2016-2017 by PCP)  . CVA (cerebral infarction)   . Depression   . DVT (deep venous thrombosis) ([place]) 2006  . H/O blood clots   . Hematuria   . HOH (hard of hearing)   . Hypercholesterolemia   . OSA (obstructive sleep apnea)    intol to CPAP  . Permanent atrial fibrillation ([place])   . Prostate enlargement   . Pulmonary nodules   . 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 Buena Irish and Dr. Roxy Manns  . Thoracic ascending aortic aneurysm ([place])    4.5cm in 11/2018, stable since 2016  . TIA (transient ischemic attack)    Past Surgical History:  Procedure Laterality Date  . BURR HOLE Left 11/21/2020   Procedure: LEFT Side BURR HOLES,;  Surgeon: [name], MD;  Location: [facility];  Service: Neurosurgery;  Laterality: Left;  . EMBOLECTOMY Left 07/02/2016   Procedure: EMBOLECTOMY LEFT FEMORAL ARTERY;  Surgeon: [name], MD;  Location: [facility];  Service: Vascular;  Laterality: Left;  Marland Kitchen MESENTERIC ARTERY BYPASS N/A 08/06/2019   Procedure: SUPERIOR MESENTERIC ARTERY THROMBECTOMY;  Surgeon: [name], MD;  Location: [facility]  OR;  Service: Vascular;  Laterality: N/A;  . NM MYOCAR PERF WALL MOTION  08/24/2002   markedly positive,subtle lateral ischemia  . PATCH ANGIOPLASTY  08/06/2019   Procedure: Patch Angioplasty of superior messenteric artery with RIGHT femoral vein;  Surgeon: [name], MD;  Location: [facility];  Service: Vascular;;  . PROSTATE SURGERY    . RIGHT HEART CATH AND CORONARY ANGIOGRAPHY N/A 10/21/2020   Procedure: RIGHT HEART CATH AND CORONARY ANGIOGRAPHY;  Surgeon: [name], MD;  Location: [facility];  Service: Cardiovascular;  Laterality: N/A;  . TEE WITHOUT CARDIOVERSION N/A 10/22/2020   Procedure: TRANSESOPHAGEAL ECHOCARDIOGRAM (TEE);  Surgeon: [name], MD;  Location: [facility];  Service: Open Heart Surgery;  Laterality: N/A;  . TRANSCATHETER AORTIC VALVE REPLACEMENT, TRANSFEMORAL Right 10/22/2020   Procedure: TRANSCATHETER AORTIC VALVE REPLACEMENT, TRANSFEMORAL;  Surgeon: [name], MD;  Location: [facility];  Service: Open Heart Surgery;  Laterality: Right;  . US ECHOCARDIOGRAPHY  01/06/2012   mod. AOV ca+,mild to mod. AS,mod mostly posterior MAC-mild MR   Family History  Problem Relation Age of Onset  . Heart failure Mother   . Diabetes Father   . Stroke Father    Social History:  reports that he quit smoking about 68 years ago. His smoking use included cigarettes. He has a 10.00 pack-year smoking history. He has never used smokeless tobacco. He reports previous alcohol use. He reports that he does not use drugs. Allergies:  Allergies  Allergen Reactions  . Flomax [Tamsulosin Hcl] Other (See Comments)    Not able to urinate  . Levetiracetam Other (See Comments)    Unknown reaction   Medications Prior to Admission  Medication Sig Dispense Refill  . acetaminophen (TYLENOL) 500 MG tablet Take 500 mg by mouth every 8 (eight) hours as needed for mild pain.    Marland Kitchen amoxicillin (AMOXIL) 500 MG tablet Take 4 tablets (2000 mg) one hour prior to all dental visits. 8 tablet 11  . apixaban (ELIQUIS) 5 MG TABS tablet Take 5 mg by mouth 2 (two) times daily.    Marland Kitchen aspirin EC 81 MG tablet Take 81 mg by mouth daily. Swallow whole.    Marland Kitchen atorvastatin (LIPITOR) 80 MG tablet Take 80 mg by mouth daily.    Marland Kitchen b complex vitamins  capsule Take 1 capsule by mouth daily.    . divalproex (DEPAKOTE ER) 500 MG 24 hr tablet Take 1 tablet (500 mg total) by mouth at bedtime. 30 tablet 11  . finasteride (PROSCAR) 5 MG tablet Take 5 mg by mouth daily.    . Rivaroxaban (XARELTO) 15 MG TABS tablet Take 1 tablet (15 mg total) by mouth daily with supper. (Patient not taking: No sig reported) 90 tablet 3    Drug Regimen Review Drug regimen was reviewed and remains appropriate with no significant issues identified  Home: Home Living Family/patient expects to be discharged to:: Private residence Living Arrangements: Spouse/significant other Available Help at Discharge: Family,Available 24 hours/day Type of Home: House Home Access: Stairs to enter [employer] of Steps: 2 Entrance Stairs-Rails: Right Home Layout: Two level,Able to live on main level with bedroom/bathroom Bathroom Shower/Tub: Multimedia programmer: Standard Home Equipment: Environmental consultant - 2 wheels,Bedside commode,Cane - single point Additional Comments: home setup obtained from PT evaluation  Lives With: Spouse   Functional History: Prior Function Level of Independence: Independent with assistive device(s) Comments: pt reports that he has 12 acres of land and likes riding his machinery on the  land. 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 Pt able to push up from elevated HOB, but required assist to steady Transfers Overall transfer level: Needs assistance Equipment used: 2 person hand held assist Transfers: Sit to/from [place] to Stand: Mod  assist,+2 physical assistance Stand pivot transfers: Max assist,+2 physical assistance General transfer comment: pt requires increased time and significant assist to power up, steady, and max cues to maintain upright position/posture. no knee buckling. pt unable to initiate any steps, even with facilitation of wt shift, and requirted maxA to pivot to recliner. Ambulation/Gait Ambulation/Gait assistance: Mod assist,+2 physical assistance Gait Distance (Feet): 4 Feet Assistive device: 2 person hand held assist Gait Pattern/deviations: Step-to pattern,Decreased step length - right,Decreased weight shift to left,Decreased dorsiflexion - right General Gait Details: pt unable to initiate any steps at this time Gait velocity: reduced Gait velocity interpretation: <1.31 ft/sec, indicative of household ambulator    ADL: ADL Overall ADL's : Needs assistance/impaired Eating/Feeding: Set up,Bed level Eating/Feeding Details (indicate cue type and reason): noted to have food in the bed with him due to spillage. pt after getting up states "who put those potatos in my bed?" Grooming: Sitting,Set up,Min guard Grooming Details (indicate cue type and reason): may need cues for inititation and sequencing? Upper Body Bathing: Minimal assistance,Sitting Lower Body Bathing: Moderate assistance,Sit to/from stand Upper Body Dressing : Set up,Min guard,Minimal assistance,Sitting Lower Body Dressing: Moderate assistance,Bed level Lower Body Dressing Details (indicate cue type and reason): pt is unable to open sock and even with hooked sock over toes unable to pull up with hands. pt able to reach but not able to pull up due to fine motor challenges Toilet Transfer: +2 for physical assistance,Minimal assistance,Stand-pivot Toilet Transfer Details (indicate cue type and reason): simulated bed to chair Toileting- Clothing Manipulation and Hygiene: Moderate assistance,Sit to/from stand Functional mobility during ADLs:  Minimal assistance,Rolling walker,Moderate assistance,Cueing for sequencing General ADL Comments: pt only allowed oOB to chair this session. pt demonstrates decreased awareness to R side  Cognition: Cognition Overall Cognitive Status: Impaired/Different from baseline Arousal/Alertness: Awake/alert Orientation Level: Disoriented X4 Attention: Sustained Sustained Attention: Appears intact Memory: Impaired Memory Impairment: Storage deficit,Retrieval deficit Awareness: Impaired Awareness Impairment: Intellectual impairment,Emergent impairment,Anticipatory impairment Problem Solving: Impaired Problem Solving Impairment: Functional basic,Verbal basic Behaviors: Other (comment) (frustration) Safety/Judgment: Impaired Cognition Arousal/Alertness: Lethargic Behavior During Therapy: Flat affect Overall Cognitive Status: Impaired/Different from baseline Area of Impairment: Orientation,Memory,Following commands,Safety/judgement,Awareness,Problem solving Orientation Level: Disoriented to,Place,Time,Situation Memory: Decreased short-term memory Following Commands: Follows one step commands inconsistently,Follows one step commands with increased time Safety/Judgement: Decreased awareness of safety,Decreased awareness of deficits Problem Solving: Slow processing,Decreased initiation,Difficulty sequencing,Requires verbal cues,Requires tactile cues General Comments: pt with significant decrease in awareness, interaction, speech, and orientation at this session. He was unable to follow simple commands without increased processing time, verbal, and tactile cues. The pt was also unable to use speech at this time, using inconsistent yes/no nods when cued. Pt inaccurately answering simple yes/no questions such as "Is this your son?" or "did you serve in the [employer]?" Difficult to assess due to: Hard of hearing/deaf (hearing aides in for session)  Physical Exam: Blood pressure 112/64, pulse 79, temperature  98.5 F (36.9 C), temperature source Oral, resp. rate (!) 22, SpO2 94 %. Physical Exam  No results found for this or any previous visit (from the past 48 hour(s)). No results found.     Medical Problem List and Plan: 1.  Decreased functional ability with altered mental status secondary to traumatic SDH  -patient may *** shower  -ELOS/Goals: *** 2.  Antithrombotics: -DVT/anticoagulation: Subcutaneous heparin initiated 11/25/2020  -antiplatelet therapy: N/A 3. Pain Management: Tylenol as needed 4. Mood: Provide emotional support  -antipsychotic agents: N/A 5. Neuropsych: This patient is not capable of making decisions on his own behalf. 6. Skin/Wound Care: Routine skin checks 7. Fluids/Electrolytes/Nutrition: Routine in and outs with follow-up chemistries 8.  BPH.  Proscar 5 mg daily.  Check PVR 9.  Atrial fibrillation.  Eliquis on hold due to SDH. 10.  CKD stage III.  Admission creatinine 1.34 11.  Ascending thoracic aortic aneurysm.  Follow-up outpatient Dr. Caffie Pinto 12.  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  Follow-up outpatient cardiology services.  Dr. [name]  ***  Lavon Paganini Angiulli, PA-C 11/25/2020

## 2020-11-26 ENCOUNTER — Inpatient Hospital Stay (HOSPITAL_COMMUNITY): Payer: Medicare Other

## 2020-11-26 ENCOUNTER — Ambulatory Visit (HOSPITAL_COMMUNITY): Payer: PRIVATE HEALTH INSURANCE

## 2020-11-26 DIAGNOSIS — S065X9A Traumatic subdural hemorrhage with loss of consciousness of unspecified duration, initial encounter: Secondary | ICD-10-CM | POA: Diagnosis not present

## 2020-11-26 DIAGNOSIS — I4891 Unspecified atrial fibrillation: Secondary | ICD-10-CM | POA: Diagnosis not present

## 2020-11-26 DIAGNOSIS — R4701 Aphasia: Secondary | ICD-10-CM | POA: Diagnosis present

## 2020-11-26 DIAGNOSIS — R531 Weakness: Secondary | ICD-10-CM

## 2020-11-26 LAB — CREATININE, URINE, RANDOM: Creatinine, Urine: 96.36 mg/dL

## 2020-11-26 LAB — SODIUM, URINE, RANDOM: Sodium, Ur: 73 mmol/L

## 2020-11-26 LAB — VALPROIC ACID LEVEL: Valproic Acid Lvl: 26 ug/mL — ABNORMAL LOW (ref 50.0–100.0)

## 2020-11-26 MED ORDER — LORAZEPAM 2 MG/ML IJ SOLN: 0.5000 mg | INTRAMUSCULAR | Status: DC | PRN

## 2020-11-26 MED ORDER — VALPROATE SODIUM 100 MG/ML IV SOLN
250.0000 mg | Freq: Four times a day (QID) | INTRAVENOUS | Status: DC
  Administered 2020-11-26 – 2020-11-29 (×13): 250 mg via INTRAVENOUS
  Filled 2020-11-26 (×16): qty 2.5

## 2020-11-26 NOTE — Evaluation (Signed)
Clinical/Bedside Swallow Evaluation Patient Details  Name: Cesar [name] MRN: [id number] Date of Birth: Feb 09, 1929  Today's Date: 11/26/2020 Time: SLP Start Time (ACUTE ONLY): 1258 SLP Stop Time (ACUTE ONLY): 1320 SLP Time Calculation (min) (ACUTE ONLY): 22 min  Past Medical History:  Past Medical History:  Diagnosis Date  . AAA (abdominal aortic aneurysm) ([place])   . CKD (chronic kidney disease), stage III (HCC)    baseline Cr 1.4-1.5 in 2016-2017 by PCP)  . CVA (cerebral infarction)   . Depression   . DVT (deep venous thrombosis) ([place]) 2006  . H/O blood clots   . Hematuria   . HOH (hard of hearing)   . Hypercholesterolemia   . OSA (obstructive sleep apnea)    intol to CPAP  . Permanent atrial fibrillation ([place])   . Prostate enlargement   . Pulmonary nodules   . 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 Buena Irish and Dr. Roxy Manns  . Thoracic ascending aortic aneurysm ([place])    4.5cm in 11/2018, stable since 2016  . TIA (transient ischemic attack)    Past Surgical History:  Past Surgical History:  Procedure Laterality Date  . BURR HOLE Left 11/21/2020   Procedure: LEFT Side BURR HOLES,;  Surgeon: [name], MD;  Location: [place];  Service: Neurosurgery;  Laterality: Left;  . EMBOLECTOMY Left 07/02/2016   Procedure: EMBOLECTOMY LEFT FEMORAL ARTERY;  Surgeon: [name], MD;  Location: [place];  Service: Vascular;  Laterality: Left;  Marland Kitchen MESENTERIC ARTERY BYPASS N/A 08/06/2019   Procedure: SUPERIOR MESENTERIC ARTERY THROMBECTOMY;  Surgeon: [name], MD;  Location: [place];  Service: Vascular;  Laterality: N/A;  . NM [place]  08/24/2002   markedly positive,subtle lateral ischemia  . PATCH ANGIOPLASTY  08/06/2019   Procedure: Patch Angioplasty of superior messenteric artery with RIGHT femoral vein;  Surgeon: [name], MD;  Location: [place];  Service: Vascular;;   . PROSTATE SURGERY    . RIGHT HEART CATH AND CORONARY ANGIOGRAPHY N/A 10/21/2020   Procedure: RIGHT HEART CATH AND CORONARY ANGIOGRAPHY;  Surgeon: [name], MD;  Location: [place] [facility];  Service: Cardiovascular;  Laterality: N/A;  . TEE WITHOUT CARDIOVERSION N/A 10/22/2020   Procedure: TRANSESOPHAGEAL ECHOCARDIOGRAM (TEE);  Surgeon: [name], MD;  Location: [facility];  Service: Open Heart Surgery;  Laterality: N/A;  . TRANSCATHETER AORTIC VALVE REPLACEMENT, TRANSFEMORAL Right 10/22/2020   Procedure: TRANSCATHETER AORTIC VALVE REPLACEMENT, TRANSFEMORAL;  Surgeon: [name], MD;  Location: [place];  Service: Open Heart Surgery;  Laterality: Right;  . US ECHOCARDIOGRAPHY  01/06/2012   mod. AOV ca+,mild to mod. AS,mod mostly posterior MAC-mild MR   HPI:  87 y.o. male with medical history significant of permanent A. fib, CKD stage III, CVA, seizure disorder, depression, history of DVT, hyperlipidemia, OSA not on CPAP, AAA, ascending thoracic aortic aneurysm, severe aortic stenosis status post TAVR 10/22/2020, nonobstructive CAD presenting to the ED via EMS for evaluation of weakness and multiple falls. Head CT showing acute on chronic left cerebral convexity subdural hematoma. Pt s/p L sided burr hole x2 and placement of subdural drain for evacuation of SDH 11/21/20   Assessment / Plan / Recommendation Clinical Impression  Pt with novel dysphagia and aphasia since admission, also developed right sided oral motor weakness. Aphasia appears to impact both expressive and receptive language. Pt premorbidly very HOH despite hearing aids likely  futher impairing auditory comprehension. Daughter at bedside. Pt eager for POs, very impulsive with rate of consumption. Pt with anterior spillage at midline with thins via cup and suspected delay in swallow initiation per palpation. Pt with reduced labial seal with puree and solid PO. Pt with significant right oral pocketing with solids,  would attempt to rapidly consume more PO without pocketing awareness following by overt coughing with solids concerning for reduced airway protection. No overt s/sx of aspiration with puree and thins in isolation. Recommend conservative dysphagia 1 (puree) and thin liquids with meds crushed in puree. SLP to follow for diet tolerance and cognitive linguistic intervention.  SLP Visit Diagnosis: Dysphagia, oral phase (R13.11);Dysphagia, unspecified (R13.10)    Aspiration Risk  Moderate aspiration risk    Diet Recommendation   Dysphagia 1 (puree) thin liquids  Medication Administration: Crushed with puree    Other  Recommendations Oral Care Recommendations: Oral care BID   Follow up Recommendations Inpatient Rehab      Frequency and Duration min 1 x/week  2 weeks       Prognosis Prognosis for Safe Diet Advancement: Good Barriers to Reach Goals: Cognitive deficits;Time post onset      Swallow Study   General Date of Onset: 11/19/20 HPI: 87 y.o. male with medical history significant of permanent A. fib, CKD stage III, CVA, seizure disorder, depression, history of DVT, hyperlipidemia, OSA not on CPAP, AAA, ascending thoracic aortic aneurysm, severe aortic stenosis status post TAVR 10/22/2020, nonobstructive CAD presenting to the ED via EMS for evaluation of weakness and multiple falls. Head CT showing acute on chronic left cerebral convexity subdural hematoma. Pt s/p L sided burr hole x2 and placement of subdural drain for evacuation of SDH 11/21/20 Type of Study: Bedside Swallow Evaluation Diet Prior to this Study: NPO Temperature Spikes Noted: No Respiratory Status: Room air History of Recent Intubation: Yes Length of Intubations (days): 1 days Date extubated: 11/21/20 Behavior/Cognition: Alert;Other (Comment);Requires cueing (aphasic) Oral Cavity Assessment: Within Functional Limits Oral Care Completed by SLP: No Oral Cavity - Dentition: Dentures, top;Missing dentition Vision:  Functional for self-feeding Self-Feeding Abilities: Needs assist Patient Positioning: Upright in bed Baseline Vocal Quality: Low vocal intensity Volitional Cough: Strong Volitional Swallow: Unable to elicit    Oral/Motor/Sensory Function Overall Oral Motor/Sensory Function: Moderate impairment Facial ROM: Reduced right;Suspected CN VII (facial) dysfunction Facial Symmetry: Abnormal symmetry right Facial Strength: Reduced right Facial Sensation: Reduced right Lingual ROM: Reduced right   Ice Chips Ice chips: Within functional limits   Thin Liquid Thin Liquid: Impaired Presentation: Cup;Straw Oral Phase Impairments: Reduced labial seal Oral Phase Functional Implications: Prolonged oral transit Pharyngeal  Phase Impairments: Suspected delayed Swallow;Multiple swallows    Nectar Thick Nectar Thick Liquid: Not tested   Honey Thick Honey Thick Liquid: Not tested   Puree Puree: Impaired Presentation: Self Fed Oral Phase Impairments: Reduced labial seal Oral Phase Functional Implications: Prolonged oral transit;Oral residue Pharyngeal Phase Impairments: Suspected delayed Swallow;Multiple swallows   Solid     Solid: Impaired Presentation: Self Fed Oral Phase Impairments: Reduced labial seal;Impaired mastication;Reduced lingual movement/coordination Oral Phase Functional Implications: Oral residue;Right anterior spillage (right sided pocketing without pt awareness) Pharyngeal Phase Impairments: Suspected delayed Swallow;Multiple swallows;Cough - Immediate;Cough - Delayed      Simora Dingee H. MA, CCC-SLP Acute Rehabilitation Services   11/26/2020,1:41 PM

## 2020-11-26 NOTE — Progress Notes (Signed)
Inpatient Rehab Admissions Coordinator:   Pt. With a change in mental status 11/25/20, work-up still ongoing. I will not admit to CIR today but will follow and monitor for improvement.   Clemens Catholic, [place], [place] Admissions Coordinator  [phone] ([place]) [phone] (office)

## 2020-11-26 NOTE — Progress Notes (Signed)
Physical Therapy Treatment Patient Details Name: Cesar [name] MRN: [id number] DOB: 1929-05-14 Today's Date: 11/26/2020    History of Present Illness Pt is 87 y.o. male with medical history significant of permanent A. fib on Eliquis, CKD stage III, CVA, seizure disorder, depression, history of DVT, hyperlipidemia, OSA not on CPAP, AAA, ascending thoracic aortic aneurysm followed by Dr. [name], severe aortic stenosis status post TAVR 10/22/2020, nonobstructive CAD presenting to the ED via EMS for evaluation of weakness and multiple falls. Head CT showing acute on chronic left cerebral convexity subdural hematoma. Pt s/p L sided burr hole x2 and placement of subdural drain for evacuation of SDH 11/21/20.  Drain has been removed.  Pt with decline in mental status and R UE weakness 2/21 - MRI was negative for acute changes, Pt with hx of seizures - now on continuous EEG.    PT Comments    Pt with decline in status yesterday.  MRI was negative for acute changes, but pt w hx of seizures and now on continuous EEG.  Spoke with MD in room prior to session who recommended bed level therapy today due to new EEG.  Pt participated with LE exercises with multimodal cues including demo, tactile, visual targets, and verbal.  Demonstrates R LE weak compared to L LE.  Pt was not able to verbally communicate but did nod yes/no appropriately at times.  Continue to progress as able.    Follow Up Recommendations  CIR;Supervision for mobility/OOB     Equipment Recommendations  Wheelchair (measurements PT);Wheelchair cushion (measurements PT);Hospital bed    Recommendations for Other Services       Precautions / Restrictions Precautions Precautions: Fall Precaution Comments: R inattention HOH with hearing aides ; continuous EEG    Mobility  Bed Mobility Overal bed mobility: Needs Assistance             General bed mobility comments: Min A to move into long sitting position.  Spoke with MD in room prior  to session who recommended bed level today due to new continuous EEG.    Transfers                    Ambulation/Gait                 Stairs             Wheelchair Mobility    Modified Rankin (Stroke Patients Only)       Balance                                            Cognition Arousal/Alertness: Awake/alert Behavior During Therapy: Flat affect Overall Cognitive Status: Difficult to assess Area of Impairment: Orientation;Following commands;Safety/judgement;Awareness;Problem solving;Attention                 Orientation Level: Disoriented to;Place;Time;Situation;Person Current Attention Level: Sustained   Following Commands: Follows one step commands inconsistently;Follows one step commands with increased time Safety/Judgement: Decreased awareness of safety;Decreased awareness of deficits Awareness: Intellectual Problem Solving: Slow processing;Decreased initiation;Difficulty sequencing;Requires verbal cues;Requires tactile cues General Comments: Difficult to assess as pt now with impaired communication - nonverbal (new as of 2/21).  He did nod yes/no appropriately 75% of the time and was able to follow simple commands with multimodal cues.      Exercises General Exercises - Lower Extremity Ankle Circles/Pumps: AROM;Both;10 reps;Supine (Tactile cues and  demo) Short Arc Quad: AROM;Both;10 reps;Supine (visual target) Heel Slides: 10 reps;Supine;AROM;Left;AAROM;Right (Tactile cues and demo for right; min resistance eccentric phase) Hip ABduction/ADduction: AROM;Left;AAROM;Right (Tactile cues and demo for right) Straight Leg Raises: AROM;Left;AAROM;Right;10 reps;Supine (Tactile cues and demo for right) Other Exercises Other Exercises: pull to long sitting x 6 with min A and use of L UE    General Comments General comments (skin integrity, edema, etc.): OOB activity held due to continuous EEG.      Pertinent Vitals/Pain  Pain Assessment: No/denies pain    Home Living                      Prior Function            PT Goals (current goals can now be found in the care plan section) Acute Rehab PT Goals Patient Stated Goal: none stated PT Goal Formulation: With patient/family Time For Goal Achievement: 12/06/20 Potential to Achieve Goals: Good Progress towards PT goals: Not progressing toward goals - comment (change in status)    Frequency    Min 4X/week      PT Plan Current plan remains appropriate    Co-evaluation              AM-PAC PT "6 Clicks" Mobility   Outcome Measure  Help needed turning from your back to your side while in a flat bed without using bedrails?: A Little Help needed moving from lying on your back to sitting on the side of a flat bed without using bedrails?: A Lot Help needed moving to and from a bed to a chair (including a wheelchair)?: A Lot Help needed standing up from a chair using your arms (e.g., wheelchair or bedside chair)?: A Lot Help needed to walk in hospital room?: Total Help needed climbing 3-5 steps with a railing? : Total 6 Click Score: 11    End of Session   Activity Tolerance: Other (comment) (Limited due to continuous EEG) Patient left: with call bell/phone within reach;with family/visitor present;in bed;with bed alarm set;with SCD's reapplied Nurse Communication: Mobility status PT Visit Diagnosis: Unsteadiness on feet (R26.81);Other abnormalities of gait and mobility (R26.89);Muscle weakness (generalized) (M62.81);Other symptoms and signs involving the nervous system (R29.898)     Time: 1240-1300 PT Time Calculation (min) (ACUTE ONLY): 20 min  Charges:  $Therapeutic Exercise: 8-22 mins                     Abran Richard, PT Acute Rehab Services Pager [phone] [facility] [place] 11/26/2020, 1:12 PM

## 2020-11-26 NOTE — Consult Note (Signed)
Neurology Consultation Reason for Consult: worsening right sided weakness and aphasia  Requesting Physician: Dr. Roel Cluck   CC:   History is obtained from: Primarily for chart review given patient's mental status  HPI: Cesar [name] is a 87 y.o. male with a past medical history significant for atrial fibrillation on Eliquis held due to chronic subdural s/p evacuation, hypertension, hyperlipidemia, obstructive sleep apnea not on CPAP, prostate enlargement, total aortic valve replacement for severe aortic stenosis, seizure disorder on Depakote.  He was originally brought in by his son for frequent falls and difficulty with right leg weakness.  Head CT revealed subdural hematoma up to 3 cm in depth with associated mass-effect (5 mm midline shift).  Decision was made to proceed with surgical evacuation which was completed on 11/21/2020.  2/22 he was noted to have reduced communication and increasing weakness of the right upper extremity.  EEG was obtained and was negative for any clear epileptogenic activity.  Repeat head CT was stable.  Medicine was consulted for concern for UTI contributing to his altered mental status and neurology was consulted by internal medicine due to concern for his abrupt decline.  He was noted to have a T-max of 37.7 and was started on Bactrim for concern for UTI on 2/21 as well  ROS: Unable to obtain due to altered mental status.   Past Medical History:  Diagnosis Date  . AAA (abdominal aortic aneurysm) ([place])   . CKD (chronic kidney disease), stage III (HCC)    baseline Cr 1.4-1.5 in 2016-2017 by PCP)  . CVA (cerebral infarction)   . Depression   . DVT (deep venous thrombosis) ([place]) 2006  . H/O blood clots   . Hematuria   . HOH (hard of hearing)   . Hypercholesterolemia   . OSA (obstructive sleep apnea)    intol to CPAP  . Permanent atrial fibrillation ([place])   . Prostate enlargement   . Pulmonary nodules   . S/P TAVR (transcatheter aortic valve replacement)  10/22/2020   s/p TAVR with a 29 mm Edwards S3U via the TF approch by Dr. Buena Irish and Dr. Roxy Manns  . Thoracic ascending aortic aneurysm ([place])    4.5cm in 11/2018, stable since 2016  . TIA (transient ischemic attack)    Past Surgical History:  Procedure Laterality Date  . BURR HOLE Left 11/21/2020   Procedure: LEFT Side BURR HOLES,;  Surgeon: [name], MD;  Location: [place];  Service: Neurosurgery;  Laterality: Left;  . EMBOLECTOMY Left 07/02/2016   Procedure: EMBOLECTOMY LEFT FEMORAL ARTERY;  Surgeon: [name], MD;  Location: [facility];  Service: Vascular;  Laterality: Left;  Marland Kitchen MESENTERIC ARTERY BYPASS N/A 08/06/2019   Procedure: SUPERIOR MESENTERIC ARTERY THROMBECTOMY;  Surgeon: [name], MD;  Location: [place];  Service: Vascular;  Laterality: N/A;  . NM [place]  08/24/2002   markedly positive,subtle lateral ischemia  . PATCH ANGIOPLASTY  08/06/2019   Procedure: Patch Angioplasty of superior messenteric artery with RIGHT femoral vein;  Surgeon: [name], MD;  Location: [place];  Service: Vascular;;  . PROSTATE SURGERY    . RIGHT HEART CATH AND CORONARY ANGIOGRAPHY N/A 10/21/2020   Procedure: RIGHT HEART CATH AND CORONARY ANGIOGRAPHY;  Surgeon: [name], MD;  Location: [place] [facility];  Service: Cardiovascular;  Laterality: N/A;  . TEE WITHOUT CARDIOVERSION N/A 10/22/2020   Procedure: TRANSESOPHAGEAL ECHOCARDIOGRAM (TEE);  Surgeon: [name], MD;  Location: [place];  Service: Open Heart Surgery;  Laterality: N/A;  . TRANSCATHETER AORTIC VALVE REPLACEMENT, TRANSFEMORAL Right 10/22/2020   Procedure: TRANSCATHETER AORTIC VALVE REPLACEMENT, TRANSFEMORAL;  Surgeon: [name], MD;  Location: [place];  Service: Open Heart Surgery;  Laterality: Right;  . US ECHOCARDIOGRAPHY  01/06/2012   mod. AOV ca+,mild to mod. AS,mod mostly posterior MAC-mild MR   Current Outpatient Medications  Medication Instructions  . acetaminophen  (TYLENOL) 500 mg, Oral, Every 8 hours PRN  . amoxicillin (AMOXIL) 500 MG tablet Take 4 tablets (2000 mg) one hour prior to all dental visits.  Marland Kitchen apixaban (ELIQUIS) 5 mg, Oral, 2 times daily  . aspirin EC 81 mg, Oral, Daily, Swallow whole.  Marland Kitchen atorvastatin (LIPITOR) 80 mg, Oral, Daily  . b complex vitamins capsule 1 capsule, Oral, Daily  . divalproex (DEPAKOTE ER) 500 mg, Oral, Daily at bedtime  . finasteride (PROSCAR) 5 mg, Oral, Daily  . Rivaroxaban (XARELTO) 15 mg, Oral, Daily with supper     Family History  Problem Relation Age of Onset  . Heart failure Mother   . Diabetes Father   . Stroke Father     Social History:  reports that he quit smoking about 68 years ago. His smoking use included cigarettes. He has a 10.00 pack-year smoking history. He has never used smokeless tobacco. He reports previous alcohol use. He reports that he does not use drugs.   Exam: Current vital signs: BP 110/70 (BP Location: Left Arm)   Pulse 74   Temp 98.6 F (37 C) (Oral)   Resp 20   SpO2 96%  Vital signs in last 24 hours: Temp:  [97.4 F (36.3 C)-99.9 F (37.7 C)] 98.6 F (37 C) (02/22 0331) Pulse Rate:  [62-100] 74 (02/22 0331) Resp:  [19-20] 20 (02/22 0331) BP: (104-140)/(67-92) 110/70 (02/22 0331) SpO2:  [95 %-99 %] 96 % (02/22 0331)   Physical Exam  Constitutional: Appears well-developed and well-nourished.  Psych: Affect flat, cooperative but limited by aphasia Eyes: No scleral injection HENT: No oropharyngeal obstruction.  MSK: no joint deformities.  Cardiovascular: Irregularly irregular rhythm Respiratory: Effort normal, non-labored breathing GI: Soft.  No distension. There is no tenderness.  Skin: Warm dry and intact visible skin  Neuro: Mental Status: Patient is awake and attends to examiner, but has minimal verbal output (small moans or grunts occasionally).  He is not able to follow most commands (does not stick out his tongue, show me his thumb, but does perform  finger-to-nose testing).  He additionally perseverates on the finger-to-nose command when asked to do other things Cranial Nerves: II: Visual Fields are unreliable on blink to threat testing. Pupils are equal, round, and reactive to light.  2 mm to 1 mm III,IV, VI: EOMI per tracking examiner V: Facial sensation is symmetric to eyelash brush VII: Facial movement is notable for mild right facial droop VIII: hearing is intact to voice Motor: Tone is normal. Bulk is normal.  Confrontational testing limited due to aphasia, but is notably weaker on the right than the left.  On the left he is at least 4/5 throughout.  On the right he is 1-2/5 Sensory: Appears to be less responsive to noxious stimulation on the right compared to the left Deep Tendon Reflexes: Hypoactive throughout Plantars: Toes are mute bilaterally Cerebellar: Finger-to-nose is intact on the left upper extremity, unable to test other extremities due to aphasia  I have reviewed labs in epic and the results pertinent to this consultation are: Creatinine 1.854 (baseline 1.3-1.8), GFR 43 UA with trace leukocytes,  rare bacteria, otherwise bland Depakote level 26 3 AM 2/22 Mildly reduced platelets at 132 and hemoglobin at 12.9  I have reviewed the images obtained: Head CT without acute intracranial process MRI brain with stable subdural hematoma and no other acute intracranial process  EEG obtained 2/20 1 in the afternoon was not demonstrative of seizure activity  Impression: This is an 87 year old male with a past medical history significant for vascular risk factors as outlined above as well as recent evacuation of a chronic subdural hemorrhage.  Given he has been off of his anticoagulation, MRI brain was obtained to rule out a cardioembolic stroke consistent with his symptoms.  Fortunately this study was negative.  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  Given his subtherapeutic Depakote level, will increase this medication as well  Recommendations: -Continuous EEG monitoring -Increase Depakote to 250  mg every 6 hours -Repeat level of depakote 2/23 AM -Neurology will continue to follow   [place] [phone]

## 2020-11-26 NOTE — Progress Notes (Signed)
SLP Cancellation Note  Patient Details Name: Cesar [name] MRN: [id number] DOB: Nov 02, 1928   Cancelled treatment:       Reason Eval/Treat Not Completed: Patient at procedure or test/unavailable; will follow up for completion of clinical swallowing evaluation   Hayden Rasmussen MA, CCC-SLP Acute Rehabilitation Services   11/26/2020, 9:42 AM

## 2020-11-26 NOTE — Progress Notes (Signed)
vLTM EEG Started. Notified Neuro

## 2020-11-26 NOTE — Progress Notes (Signed)
Occupational Therapy Treatment Patient Details Name: Cesar [name] MRN: [id number] DOB: October 30, 1928 Today's Date: 11/26/2020    History of present illness Pt is 87 y.o. male with medical history significant of permanent A. fib on Eliquis, CKD stage III, CVA, seizure disorder, depression, history of DVT, hyperlipidemia, OSA not on CPAP, AAA, ascending thoracic aortic aneurysm followed by Dr. [name], severe aortic stenosis status post TAVR 10/22/2020, nonobstructive CAD presenting to the ED via EMS for evaluation of weakness and multiple falls. Head CT showing acute on chronic left cerebral convexity subdural hematoma. Pt s/p L sided burr hole x2 and placement of subdural drain for evacuation of SDH 11/21/20.  Drain has been removed.  Pt with decline in mental status and R UE weakness 2/21 - MRI was negative for acute changes, Pt with hx of seizures - now on continuous EEG.   OT comments  Pt noted to have a change in status with change in functional participation at bed level this session. Pt with increased R inattention and R UE use. Pt using L UE when R UE requested in task. Pt does activate R shoulder and elbow during session but not digits. Pt with hearing aides in to help with response and visually tracking therapist R visual field with cues. Daughter present throughout session and engaged in helping with her fathers care. Daughter states I just wish we could get him back where he could tell us more because he loves to talk. Recommendation CIR at this time.    Follow Up Recommendations  CIR;Supervision/Assistance - 24 hour    Equipment Recommendations  3 in 1 bedside commode;Wheelchair (measurements OT);Wheelchair cushion (measurements OT);Hospital bed    Recommendations for Other Services Rehab consult    Precautions / Restrictions Precautions Precautions: Fall Precaution Comments: R inattention HOH with hearing aides ; continuous EEG       Mobility Bed Mobility Overal bed mobility:  Needs Assistance Bed Mobility: Rolling Rolling: Max assist         General bed mobility comments: pt rolling R and L for skin check and new linen placement. Pt noted to have leaking condom taped on cather. the paper tape was wet so to prevent skin break down it was removed and a purewick was placed instead. Pt with a constant drainage at this time with workup pending for UTI treatment    Transfers                 General transfer comment: NT due to eeg    Balance                                           ADL either performed or assessed with clinical judgement   ADL Overall ADL's : Needs assistance/impaired                                       General ADL Comments: pt with wet pad from taped condom on. Recommendation for purewick for skin integrity was placed this session with tech. pt allowed only bed level care due to EEG     Vision       Perception     Praxis      Cognition Arousal/Alertness: Awake/alert Behavior During Therapy: Flat affect Overall Cognitive Status: Difficult to assess  Exercises Other Exercises Other Exercises: PROM for R UE shoulder elbow wrist hand (AAROM with strong encouraged R UE throughout session)   Shoulder Instructions       General Comments urine incontinence noted and was not present in previous session    Pertinent Vitals/ Pain       Pain Assessment: No/denies pain  Home Living                                          Prior Functioning/Environment              Frequency  Min 2X/week        Progress Toward Goals  OT Goals(current goals can now be found in the care plan section)  Progress towards OT goals: Not progressing toward goals - comment  Acute Rehab OT Goals Patient Stated Goal: expressing information that was garbled speech. Pt in previous session was very clear and joking OT Goal  Formulation: With patient/family Time For Goal Achievement: 12/06/20 Potential to Achieve Goals: Good ADL Goals Pt Will Perform Grooming: with adaptive equipment;with set-up;sitting Pt Will Perform Upper Body Dressing: with set-up;sitting;with adaptive equipment Pt Will Perform Lower Body Dressing: with min guard assist;sit to/from stand;with adaptive equipment Pt Will Transfer to Toilet: with min assist;ambulating;bedside commode Pt Will Perform Toileting - Clothing Manipulation and hygiene: with min assist;sit to/from stand Additional ADL Goal #1: pt will complete fine motor exercise with handout min (A) as precursor to adls  Plan Discharge plan remains appropriate    Co-evaluation                 AM-PAC OT "6 Clicks" Daily Activity     Outcome Measure   Help from another person eating meals?: A Lot Help from another person taking care of personal grooming?: A Lot Help from another person toileting, which includes using toliet, bedpan, or urinal?: A Lot Help from another person bathing (including washing, rinsing, drying)?: A Lot Help from another person to put on and taking off regular upper body clothing?: A Lot Help from another person to put on and taking off regular lower body clothing?: A Lot 6 Click Score: 12    End of Session    OT Visit Diagnosis: Muscle weakness (generalized) (M62.81)   Activity Tolerance Patient tolerated treatment well   Patient Left in bed;with call bell/phone within reach;with bed alarm set;with family/visitor present   Nurse Communication Mobility status;Precautions        Time: 4680-3212 OT Time Calculation (min): 21 min  Charges: OT General Charges $OT Visit: 1 Visit OT Treatments $Therapeutic Activity: 8-22 mins   Brynn, OTR/L  Acute Rehabilitation Services Pager: [phone] Office: [phone] .    Jeri Modena 11/26/2020, 5:31 PM

## 2020-11-26 NOTE — Progress Notes (Signed)
PROGRESS NOTE    Cesar [name]   YWV:[id number]  DOB: 1929-03-20  DOA: 11/19/2020 PCP: Orpah Melter, MD   Brief Narrative:  Cesar [name]  is a 87 y.o. male with medical history significant of permanent A. fib on Eliquis, CKD stage III, CVA, seizure disorder, depression, history of DVT, hyperlipidemia, OSA not on CPAP, AAA, ascending thoracic aortic aneurysm followed by Dr. [name], severe aortic stenosis status post TAVR 10/22/2020, nonobstructive CAD presenting to the ED via EMS for evaluation of weakness and multiple falls.   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  There is associated mass-effect with 5 mm left to right midline shift. The ED physician discussed the case with neurosurgery.  Neurosurgeon recommended to hold Eliquis but not reverse it.  The patient was admitted to the hospital the plan to repeat the CT scan in the morning.  Subjective: Minimally responsive- non communicative.     Assessment & Plan:   Principal Problem:   SDH (subdural hematoma) - s/p burr hole x 2 on 2/17- holding Eliquis  Active Problems:  Decreased level of consciousness - per daughter, this was noted on the AM of 2/21- further work up included brain imaging and not acute findings were noted - EEG > cortical dysfunction in left hemisphere likely secondary to underlying hematoma as well as moderate diffuse encephalopathy, nonspecific etiology - he is now undergoing a long term EEG- no seizure activity as of yet but he has had an episode in the past that was suspected to be a  focal seizure and he was started on 500 mg of Depakote QHS - his Depakote level was low and is now his dose of Depakote is being increased to reach a therapeutic level   CKD (chronic kidney disease), stage III  - Cr at baseline at 1.40    Atrial fibrillation  -CHA2DS2-VASc Score at least 3 - Xarelto on hold- follow on telemetry - not on a  rate controlling agent  S/p TAVR - holding Xarelto and ASA  BPH - cont Proscar  Severe hearing loss     Time spent in minutes: 35 DVT prophylaxis: SCDs Code Status: Full code Family Communication: wife Level of Care: Level of care: Progressive Disposition Plan:  Status is: Observation  The patient will require care spanning > 2 midnights and should be moved to inpatient because: Inpatient level of care appropriate due to severity of illness  Dispo: The patient is from: Home              Anticipated d/c is to: TBD              Anticipated d/c date is: > 3 days              Patient currently is not medically stable to d/c.   Difficult to place patient No      Consultants:   NS Procedures:   none Antimicrobials:  Anti-infectives (From admission, onward)   Start     Dose/Rate Route Frequency Ordered Stop   11/25/20 2200  sulfamethoxazole-trimethoprim (BACTRIM) 160 mg in dextrose 5 % 250 mL IVPB  Status:  Discontinued        160 mg 260 mL/hr over 60 Minutes Intravenous Every 12 hours 11/25/20 2111 11/25/20 2116   11/25/20 1500  sulfamethoxazole-trimethoprim (BACTRIM DS) 800-160 MG per tablet 1 tablet  Status:  Discontinued        1 tablet Oral  Every 12 hours 11/25/20 1409 11/25/20 2116   11/21/20 2330  ceFAZolin (ANCEF) IVPB 1 g/50 mL premix        1 g 100 mL/hr over 30 Minutes Intravenous Every 8 hours 11/21/20 1938 11/23/20 1607       Objective: Vitals:   11/25/20 2304 11/26/20 0331 11/26/20 0731 11/26/20 1227  BP: 104/69 110/70 117/72 122/85  Pulse: 64 74 (!) 45   Resp: 19 20 20    Temp: 99.4 F (37.4 C) 98.6 F (37 C) 98.2 F (36.8 C) 98.2 F (36.8 C)  TempSrc: Oral Oral  Oral  SpO2: 99% 96% 95% 98%    Intake/Output Summary (Last 24 hours) at 11/26/2020 1543 Last data filed at 11/26/2020 1449 Gross per 24 hour  Intake 272.5 ml  Output 525 ml  Net -252.5 ml   There were no vitals filed for this visit.  Examination: General exam: Appears  comfortable  HEENT: PERRL, oral mucosa moist, no sclera icterus or thrush Respiratory system: Clear to auscultation. Respiratory effort normal. Cardiovascular system: S1 & S2 heard, RRR.   Gastrointestinal system: Abdomen soft, non-tender, nondistended. Normal bowel sounds. Central nervous system: lethargic, cannot follow commands Extremities: No cyanosis, clubbing or edema Skin: No rashes or ulcers Psychiatry:  Cannot assess    Data Reviewed: I have personally reviewed following labs and imaging studies  CBC: Recent Labs  Lab 11/19/20 1751 11/19/20 1818 11/20/20 0319 11/25/20 1232  WBC 6.8  --  6.6 6.4  NEUTROABS 5.1  --   --  4.5  HGB 12.3* 12.2* 11.6* 12.9*  HCT 36.9* 36.0* 35.3* 40.4  MCV 97.6  --  97.8 98.3  PLT 114*  --  107* 175*   Basic Metabolic Panel: Recent Labs  Lab 11/19/20 1751 11/19/20 1818 11/25/20 1232  NA 137 139 135  K 4.7 4.8 4.4  CL 104 104 99  CO2 25  --  27  GLUCOSE 123* 119* 127*  BUN 20 24* 33*  CREATININE 1.34* 1.40* 1.54*  CALCIUM 8.9  --  8.8*   GFR: Estimated Creatinine Clearance: 36.8 mL/min (A) (by C-G formula based on SCr of 1.54 mg/dL (H)). Liver Function Tests: Recent Labs  Lab 11/19/20 1751  AST 28  ALT 22  ALKPHOS 59  BILITOT 0.9  PROT 6.6  ALBUMIN 3.1*   No results for input(s): LIPASE, AMYLASE in the last 168 hours. No results for input(s): AMMONIA in the last 168 hours. Coagulation Profile: Recent Labs  Lab 11/19/20 1751  INR 1.3*   Cardiac Enzymes: Recent Labs  Lab 11/20/20 0319  CKTOTAL 56   BNP (last 3 results) No results for input(s): PROBNP in the last 8760 hours. HbA1C: No results for input(s): HGBA1C in the last 72 hours. CBG: Recent Labs  Lab 11/20/20 2135 11/21/20 0615 11/21/20 1213 11/21/20 1948  GLUCAP 141* 93 98 123*   Lipid Profile: No results for input(s): CHOL, HDL, LDLCALC, TRIG, CHOLHDL, LDLDIRECT in the last 72 hours. Thyroid Function Tests: No results for input(s): TSH,  T4TOTAL, FREET4, T3FREE, THYROIDAB in the last 72 hours. Anemia Panel: No results for input(s): VITAMINB12, FOLATE, FERRITIN, TIBC, IRON, RETICCTPCT in the last 72 hours. Urine analysis:    Component Value Date/Time   COLORURINE YELLOW 11/25/2020 [place] 11/25/2020 1214   LABSPEC 1.017 11/25/2020 1214   PHURINE 6.0 11/25/2020 [place] 11/25/2020 [place] 11/25/2020 [place] 11/25/2020 1214   [place] 11/25/2020 1214  PROTEINUR NEGATIVE 11/25/2020 1214   UROBILINOGEN 2.0 (H) 01/01/2012 1328   NITRITE NEGATIVE 11/25/2020 1214   LEUKOCYTESUR TRACE (A) 11/25/2020 1214   Sepsis Labs: @LABRCNTIP (procalcitonin:4,lacticidven:4) ) Recent Results (from the past 240 hour(s))  SARS CORONAVIRUS 2 (TAT 6-24 HRS) Nasopharyngeal Nasopharyngeal Swab     Status: None   Collection Time: 11/19/20  8:13 PM   Specimen: Nasopharyngeal Swab  Result Value Ref Range Status   SARS Coronavirus 2 NEGATIVE NEGATIVE Final    Comment: (NOTE) SARS-CoV-2 target nucleic acids are NOT DETECTED.  The SARS-CoV-2 RNA is generally detectable in upper and lower respiratory specimens during the acute phase of infection. Negative results do not preclude SARS-CoV-2 infection, do not rule out co-infections with other pathogens, and should not be used as the sole basis for treatment or other patient management decisions. Negative results must be combined with clinical observations, patient history, and epidemiological information. The expected result is Negative.  Fact Sheet for Patients: SugarRoll.be  Fact Sheet for Healthcare Providers: https://www.woods-mathews.com/  This test is not yet approved or cleared by the [place] FDA and  has been authorized for detection and/or diagnosis of SARS-CoV-2 by FDA under an Emergency Use Authorization (EUA). This EUA will remain  in effect (meaning this  test can be used) for the duration of the COVID-19 declaration under Se ction 564(b)(1) of the Act, 21 U.S.C. section 360bbb-3(b)(1), unless the authorization is terminated or revoked sooner.  Performed at [facility], [place] [address] [place], [place] [address]          Radiology Studies: CT HEAD WO CONTRAST  Result Date: 11/25/2020 CLINICAL DATA:  Altered mental status. EXAM: CT HEAD WITHOUT CONTRAST TECHNIQUE: Contiguous axial images were obtained from the base of the skull through the vertex without intravenous contrast. COMPARISON:  November 22, 2020. FINDINGS: Brain: Status post drainage of left subdural hematoma. There is decreased pneumocephalus present in the operative site. However, a stable amount of hemorrhage and fluid remains in the left subdural area compared to prior exam. Stable minimal left-to-right midline shift of 4 mm is noted. Ventricular size is within normal limits. Diffuse cortical atrophy is noted. Mild chronic ischemic white matter disease is noted. No new hemorrhage is noted. Vascular: No hyperdense vessel or unexpected calcification. Skull: Continued presence of 2 left-sided craniotomies for decompression of hematoma. Sinuses/Orbits: No acute finding. Other: None. IMPRESSION: 1. Status post drainage of left subdural hematoma. A stable amount of hemorrhage and fluid remains in the left subdural area compared to prior exam. Stable minimal left-to-right midline shift of 4 mm is noted. 2. No new hemorrhage is noted. Diffuse cortical atrophy is noted. Mild chronic ischemic white matter disease is noted. Electronically Signed   By: Marijo Conception M.D.   On: 11/25/2020 13:47   MR ANGIO HEAD WO CONTRAST  Result Date: 11/26/2020 CLINICAL DATA:  Possible stroke.  Status post left-sided burr holes. EXAM: MRI HEAD WITHOUT CONTRAST MRA HEAD WITHOUT CONTRAST TECHNIQUE: Multiplanar, multiecho pulse sequences of the brain and surrounding structures were obtained without  intravenous contrast. Angiographic images of the head were obtained using MRA technique without contrast. COMPARISON:  03/16/2019 FINDINGS: MRI HEAD FINDINGS Brain: Unchanged thickness of left convexity subdural hematoma. No acute ischemia. There is multifocal hyperintense T2-weighted signal within the white matter. Generalized volume loss without a clear lobar predilection. There are old infarcts of the right frontal lobe and left caudate head. The midline structures are normal. Vascular: Major flow voids are preserved. Skull and upper  cervical spine: Normal calvarium and skull base. Visualized upper cervical spine and soft tissues are normal. Sinuses/Orbits:No paranasal sinus fluid levels or advanced mucosal thickening. No mastoid or middle ear effusion. Normal orbits. MRA HEAD FINDINGS POSTERIOR CIRCULATION: --Vertebral arteries: Normal --Inferior cerebellar arteries: Normal. --Basilar artery: Normal. --Superior cerebellar arteries: Normal. --Posterior cerebral arteries: Normal. ANTERIOR CIRCULATION: --Intracranial internal carotid arteries: Normal. --Anterior cerebral arteries (ACA): Normal. --Middle cerebral arteries (MCA): Normal. ANATOMIC VARIANTS: None IMPRESSION: 1. Unchanged thickness of left convexity subdural hematoma. No acute ischemia. 2. Normal intracranial MRA. 3. Old infarcts of the right frontal lobe and left caudate head. 4. Chronic small vessel ischemic changes of the white matter. Electronically Signed   By: Ulyses Jarred M.D.   On: 11/26/2020 02:45   MR BRAIN WO CONTRAST  Result Date: 11/26/2020 CLINICAL DATA:  Possible stroke.  Status post left-sided burr holes. EXAM: MRI HEAD WITHOUT CONTRAST MRA HEAD WITHOUT CONTRAST TECHNIQUE: Multiplanar, multiecho pulse sequences of the brain and surrounding structures were obtained without intravenous contrast. Angiographic images of the head were obtained using MRA technique without contrast. COMPARISON:  03/16/2019 FINDINGS: MRI HEAD FINDINGS  Brain: Unchanged thickness of left convexity subdural hematoma. No acute ischemia. There is multifocal hyperintense T2-weighted signal within the white matter. Generalized volume loss without a clear lobar predilection. There are old infarcts of the right frontal lobe and left caudate head. The midline structures are normal. Vascular: Major flow voids are preserved. Skull and upper cervical spine: Normal calvarium and skull base. Visualized upper cervical spine and soft tissues are normal. Sinuses/Orbits:No paranasal sinus fluid levels or advanced mucosal thickening. No mastoid or middle ear effusion. Normal orbits. MRA HEAD FINDINGS POSTERIOR CIRCULATION: --Vertebral arteries: Normal --Inferior cerebellar arteries: Normal. --Basilar artery: Normal. --Superior cerebellar arteries: Normal. --Posterior cerebral arteries: Normal. ANTERIOR CIRCULATION: --Intracranial internal carotid arteries: Normal. --Anterior cerebral arteries (ACA): Normal. --Middle cerebral arteries (MCA): Normal. ANATOMIC VARIANTS: None IMPRESSION: 1. Unchanged thickness of left convexity subdural hematoma. No acute ischemia. 2. Normal intracranial MRA. 3. Old infarcts of the right frontal lobe and left caudate head. 4. Chronic small vessel ischemic changes of the white matter. Electronically Signed   By: Ulyses Jarred M.D.   On: 11/26/2020 02:45   Portable EEG  Result Date: 11/25/2020 Lora Havens, MD     11/25/2020  4:57 PM Patient Name: Cesar [name] MRN: [id number] Epilepsy Attending: Lora Havens Referring Physician/Provider: Dr. Duffy Rhody Date: 11/25/2020 Duration: 25.05 mins Patient history: 87 year old male with left subdural hematoma.  EEG to evaluate for seizures. Level of alertness: lethargic AEDs during EEG study: Depakote Technical aspects: This EEG study was done with scalp electrodes positioned according to the 10-20 International system of electrode placement. Electrical activity was acquired at a sampling rate of  500Hz  and reviewed with a high frequency filter of 70Hz  and a low frequency filter of 1Hz . EEG data were recorded continuously and digitally stored. Description: EEG showed continuous generalized 6 to 9 Hz theta-alpha activity in the right hemisphere as well as 2 to 3 Hz rhythmic delta slowing in left hemisphere which at times appears sharply contoured. Hyperventilation and photic stimulation were not performed.   ABNORMALITY -Continuous slow, generalized and lateralized left hemisphere IMPRESSION: This study is suggestive of cortical dysfunction in left hemisphere likely secondary to underlying hematoma as well as moderate diffuse encephalopathy, nonspecific etiology. No seizures or definite epileptiform discharges were seen throughout the recording. Priyanka Barbra Sarks      Scheduled Meds: . chlorhexidine  15 mL Mouth/Throat  Once   Or  . mouth rinse  15 mL Mouth Rinse Once  . Chlorhexidine Gluconate Cloth  6 each Topical Daily  . dexamethasone  2 mg Intravenous Q8H   Followed by  . [START ON 11/27/2020] dexamethasone  2 mg Intravenous Q12H   Followed by  . [START ON 11/28/2020] dexamethasone  1 mg Intravenous Q12H   Followed by  . [START ON 11/29/2020] dexamethasone  1 mg Intravenous Q24H  . docusate sodium  100 mg Oral BID  . finasteride  5 mg Oral Daily  . heparin  5,000 Units Subcutaneous Q12H  . pantoprazole  40 mg Oral Q supper   Continuous Infusions: . sodium chloride 10 mL/hr at 11/26/20 1400  . niCARDipine    . valproate sodium Stopped (11/26/20 1327)     LOS: 6 days      Debbe Odea, MD [facility] Pager: [email or web address] 11/26/2020, 3:43 PM

## 2020-11-26 NOTE — Progress Notes (Incomplete)
PROGRESS NOTE    Cesar [name]   ZSW:[id number]  DOB: October 03, 1929  DOA: 11/19/2020 PCP: Orpah Melter, MD   Brief Narrative:  Cesar [name] is a 87 y/o with A-fib on Eliquis, HTN, HLD, OSA, BPH, Ao stenosis s/p AVR, seizure disorder on Depakote.  Presented on 2/15 to the ED with acute mental status change, weakness and a history of repeated falls. He was found to have acute bleeding into a prior subdural hematoma.   Subjective: ***    Assessment & Plan:   Principal Problem:   SDH (subdural hematoma) (HCC) Active Problems:   Hypercholesterolemia   CKD (chronic kidney disease), stage III (HCC)   Atrial fibrillation (HCC)   Frequent falls   Subdural bleeding (HCC)   Seizures (HCC)   History of CVA (cerebrovascular accident)   History of DVT (deep vein thrombosis)   OSA (obstructive sleep apnea)   Thrombocytopenia (HCC)   Aphasia   Right sided weakness   Time spent in minutes: *** DVT prophylaxis: *** Code Status: *** Family Communication: *** Level of Care: Level of care: Progressive Disposition Plan:  Status is: Inpatient  {Inpatient:23812}  Dispo: The patient is from: {From:23814}              Anticipated d/c is to: {To:23815}              Anticipated d/c date is: {Days:23816}              Patient currently {Medically stable:23817}   Difficult to place patient {Yes/No:25151}      Consultants:   *** Procedures:   *** Antimicrobials:  Anti-infectives (From admission, onward)   Start     Dose/Rate Route Frequency Ordered Stop   11/25/20 2200  sulfamethoxazole-trimethoprim (BACTRIM) 160 mg in dextrose 5 % 250 mL IVPB  Status:  Discontinued        160 mg 260 mL/hr over 60 Minutes Intravenous Every 12 hours 11/25/20 2111 11/25/20 2116   11/25/20 1500  sulfamethoxazole-trimethoprim (BACTRIM DS) 800-160 MG per tablet 1 tablet  Status:  Discontinued        1 tablet Oral Every 12 hours 11/25/20 1409 11/25/20 2116   11/21/20 2330  ceFAZolin (ANCEF) IVPB  1 g/50 mL premix        1 g 100 mL/hr over 30 Minutes Intravenous Every 8 hours 11/21/20 1938 11/23/20 1607       Objective: Vitals:   11/25/20 2304 11/26/20 0331 11/26/20 0731 11/26/20 1227  BP: 104/69 110/70 117/72 122/85  Pulse: 64 74 (!) 45   Resp: 19 20 20    Temp: 99.4 F (37.4 C) 98.6 F (37 C) 98.2 F (36.8 C) 98.2 F (36.8 C)  TempSrc: Oral Oral  Oral  SpO2: 99% 96% 95% 98%    Intake/Output Summary (Last 24 hours) at 11/26/2020 1531 Last data filed at 11/26/2020 1449 Gross per 24 hour  Intake 272.5 ml  Output 525 ml  Net -252.5 ml   There were no vitals filed for this visit.  Examination: General exam: Appears comfortable  HEENT: PERRLA, oral mucosa moist, no sclera icterus or thrush Respiratory system: Clear to auscultation. Respiratory effort normal. Cardiovascular system: S1 & S2 heard, RRR.   Gastrointestinal system: Abdomen soft, non-tender, nondistended. Normal bowel sounds. Central nervous system: Alert and oriented. No focal neurological deficits. Extremities: No cyanosis, clubbing or edema Skin: No rashes or ulcers Psychiatry:  Mood & affect appropriate.     Data Reviewed: I have personally reviewed following labs and imaging  studies  CBC: Recent Labs  Lab 11/19/20 1751 11/19/20 1818 11/20/20 0319 11/25/20 1232  WBC 6.8  --  6.6 6.4  NEUTROABS 5.1  --   --  4.5  HGB 12.3* 12.2* 11.6* 12.9*  HCT 36.9* 36.0* 35.3* 40.4  MCV 97.6  --  97.8 98.3  PLT 114*  --  107* 951*   Basic Metabolic Panel: Recent Labs  Lab 11/19/20 1751 11/19/20 1818 11/25/20 1232  NA 137 139 135  K 4.7 4.8 4.4  CL 104 104 99  CO2 25  --  27  GLUCOSE 123* 119* 127*  BUN 20 24* 33*  CREATININE 1.34* 1.40* 1.54*  CALCIUM 8.9  --  8.8*   GFR: Estimated Creatinine Clearance: 36.8 mL/min (A) (by C-G formula based on SCr of 1.54 mg/dL (H)). Liver Function Tests: Recent Labs  Lab 11/19/20 1751  AST 28  ALT 22  ALKPHOS 59  BILITOT 0.9  PROT 6.6  ALBUMIN  3.1*   No results for input(s): LIPASE, AMYLASE in the last 168 hours. No results for input(s): AMMONIA in the last 168 hours. Coagulation Profile: Recent Labs  Lab 11/19/20 1751  INR 1.3*   Cardiac Enzymes: Recent Labs  Lab 11/20/20 0319  CKTOTAL 56   BNP (last 3 results) No results for input(s): PROBNP in the last 8760 hours. HbA1C: No results for input(s): HGBA1C in the last 72 hours. CBG: Recent Labs  Lab 11/20/20 2135 11/21/20 0615 11/21/20 1213 11/21/20 1948  GLUCAP 141* 93 98 123*   Lipid Profile: No results for input(s): CHOL, HDL, LDLCALC, TRIG, CHOLHDL, LDLDIRECT in the last 72 hours. Thyroid Function Tests: No results for input(s): TSH, T4TOTAL, FREET4, T3FREE, THYROIDAB in the last 72 hours. Anemia Panel: No results for input(s): VITAMINB12, FOLATE, FERRITIN, TIBC, IRON, RETICCTPCT in the last 72 hours. Urine analysis:    Component Value Date/Time   COLORURINE YELLOW 11/25/2020 [place] 11/25/2020 1214   LABSPEC 1.017 11/25/2020 1214   PHURINE 6.0 11/25/2020 1214   GLUCOSEU NEGATIVE 11/25/2020 1214   HGBUR NEGATIVE 11/25/2020 1214   BILIRUBINUR NEGATIVE 11/25/2020 1214   KETONESUR NEGATIVE 11/25/2020 1214   PROTEINUR NEGATIVE 11/25/2020 1214   UROBILINOGEN 2.0 (H) 01/01/2012 1328   NITRITE NEGATIVE 11/25/2020 1214   LEUKOCYTESUR TRACE (A) 11/25/2020 1214   Sepsis Labs: @LABRCNTIP (procalcitonin:4,lacticidven:4) ) Recent Results (from the past 240 hour(s))  SARS CORONAVIRUS 2 (TAT 6-24 HRS) Nasopharyngeal Nasopharyngeal Swab     Status: None   Collection Time: 11/19/20  8:13 PM   Specimen: Nasopharyngeal Swab  Result Value Ref Range Status   SARS Coronavirus 2 NEGATIVE NEGATIVE Final    Comment: (NOTE) SARS-CoV-2 target nucleic acids are NOT DETECTED.  The SARS-CoV-2 RNA is generally detectable in upper and lower respiratory specimens during the acute phase of infection. Negative results do not preclude SARS-CoV-2 infection,  do not rule out co-infections with other pathogens, and should not be used as the sole basis for treatment or other patient management decisions. Negative results must be combined with clinical observations, patient history, and epidemiological information. The expected result is Negative.  Fact Sheet for Patients: SugarRoll.be  Fact Sheet for Healthcare Providers: https://www.woods-mathews.com/  This test is not yet approved or cleared by the [place] FDA and  has been authorized for detection and/or diagnosis of SARS-CoV-2 by FDA under an Emergency Use Authorization (EUA). This EUA will remain  in effect (meaning this test can be used) for the duration of the COVID-19 declaration under Se  ction 564(b)(1) of the Act, 21 U.S.C. section 360bbb-3(b)(1), unless the authorization is terminated or revoked sooner.  Performed at [facility], [place] [address], [place], [place] [address]          Radiology Studies: CT HEAD WO CONTRAST  Result Date: 11/25/2020 CLINICAL DATA:  Altered mental status. EXAM: CT HEAD WITHOUT CONTRAST TECHNIQUE: Contiguous axial images were obtained from the base of the skull through the vertex without intravenous contrast. COMPARISON:  November 22, 2020. FINDINGS: Brain: Status post drainage of left subdural hematoma. There is decreased pneumocephalus present in the operative site. However, a stable amount of hemorrhage and fluid remains in the left subdural area compared to prior exam. Stable minimal left-to-right midline shift of 4 mm is noted. Ventricular size is within normal limits. Diffuse cortical atrophy is noted. Mild chronic ischemic white matter disease is noted. No new hemorrhage is noted. Vascular: No hyperdense vessel or unexpected calcification. Skull: Continued presence of 2 left-sided craniotomies for decompression of hematoma. Sinuses/Orbits: No acute finding. Other: None. IMPRESSION: 1. Status  post drainage of left subdural hematoma. A stable amount of hemorrhage and fluid remains in the left subdural area compared to prior exam. Stable minimal left-to-right midline shift of 4 mm is noted. 2. No new hemorrhage is noted. Diffuse cortical atrophy is noted. Mild chronic ischemic white matter disease is noted. Electronically Signed   By: Marijo Conception M.D.   On: 11/25/2020 13:47   MR ANGIO HEAD WO CONTRAST  Result Date: 11/26/2020 CLINICAL DATA:  Possible stroke.  Status post left-sided burr holes. EXAM: MRI HEAD WITHOUT CONTRAST MRA HEAD WITHOUT CONTRAST TECHNIQUE: Multiplanar, multiecho pulse sequences of the brain and surrounding structures were obtained without intravenous contrast. Angiographic images of the head were obtained using MRA technique without contrast. COMPARISON:  03/16/2019 FINDINGS: MRI HEAD FINDINGS Brain: Unchanged thickness of left convexity subdural hematoma. No acute ischemia. There is multifocal hyperintense T2-weighted signal within the white matter. Generalized volume loss without a clear lobar predilection. There are old infarcts of the right frontal lobe and left caudate head. The midline structures are normal. Vascular: Major flow voids are preserved. Skull and upper cervical spine: Normal calvarium and skull base. Visualized upper cervical spine and soft tissues are normal. Sinuses/Orbits:No paranasal sinus fluid levels or advanced mucosal thickening. No mastoid or middle ear effusion. Normal orbits. MRA HEAD FINDINGS POSTERIOR CIRCULATION: --Vertebral arteries: Normal --Inferior cerebellar arteries: Normal. --Basilar artery: Normal. --Superior cerebellar arteries: Normal. --Posterior cerebral arteries: Normal. ANTERIOR CIRCULATION: --Intracranial internal carotid arteries: Normal. --Anterior cerebral arteries (ACA): Normal. --Middle cerebral arteries (MCA): Normal. ANATOMIC VARIANTS: None IMPRESSION: 1. Unchanged thickness of left convexity subdural hematoma. No acute  ischemia. 2. Normal intracranial MRA. 3. Old infarcts of the right frontal lobe and left caudate head. 4. Chronic small vessel ischemic changes of the white matter. Electronically Signed   By: Ulyses Jarred M.D.   On: 11/26/2020 02:45   MR BRAIN WO CONTRAST  Result Date: 11/26/2020 CLINICAL DATA:  Possible stroke.  Status post left-sided burr holes. EXAM: MRI HEAD WITHOUT CONTRAST MRA HEAD WITHOUT CONTRAST TECHNIQUE: Multiplanar, multiecho pulse sequences of the brain and surrounding structures were obtained without intravenous contrast. Angiographic images of the head were obtained using MRA technique without contrast. COMPARISON:  03/16/2019 FINDINGS: MRI HEAD FINDINGS Brain: Unchanged thickness of left convexity subdural hematoma. No acute ischemia. There is multifocal hyperintense T2-weighted signal within the white matter. Generalized volume loss without a clear lobar predilection. There are old infarcts of the  right frontal lobe and left caudate head. The midline structures are normal. Vascular: Major flow voids are preserved. Skull and upper cervical spine: Normal calvarium and skull base. Visualized upper cervical spine and soft tissues are normal. Sinuses/Orbits:No paranasal sinus fluid levels or advanced mucosal thickening. No mastoid or middle ear effusion. Normal orbits. MRA HEAD FINDINGS POSTERIOR CIRCULATION: --Vertebral arteries: Normal --Inferior cerebellar arteries: Normal. --Basilar artery: Normal. --Superior cerebellar arteries: Normal. --Posterior cerebral arteries: Normal. ANTERIOR CIRCULATION: --Intracranial internal carotid arteries: Normal. --Anterior cerebral arteries (ACA): Normal. --Middle cerebral arteries (MCA): Normal. ANATOMIC VARIANTS: None IMPRESSION: 1. Unchanged thickness of left convexity subdural hematoma. No acute ischemia. 2. Normal intracranial MRA. 3. Old infarcts of the right frontal lobe and left caudate head. 4. Chronic small vessel ischemic changes of the white  matter. Electronically Signed   By: Ulyses Jarred M.D.   On: 11/26/2020 02:45   Portable EEG  Result Date: 11/25/2020 Lora Havens, MD     11/25/2020  4:57 PM Patient Name: Cesar [name] MRN: [id number] Epilepsy Attending: Lora Havens Referring Physician/Provider: Dr. Duffy Rhody Date: 11/25/2020 Duration: 25.05 mins Patient history: 87 year old male with left subdural hematoma.  EEG to evaluate for seizures. Level of alertness: lethargic AEDs during EEG study: Depakote Technical aspects: This EEG study was done with scalp electrodes positioned according to the 10-20 International system of electrode placement. Electrical activity was acquired at a sampling rate of 500Hz  and reviewed with a high frequency filter of 70Hz  and a low frequency filter of 1Hz . EEG data were recorded continuously and digitally stored. Description: EEG showed continuous generalized 6 to 9 Hz theta-alpha activity in the right hemisphere as well as 2 to 3 Hz rhythmic delta slowing in left hemisphere which at times appears sharply contoured. Hyperventilation and photic stimulation were not performed.   ABNORMALITY -Continuous slow, generalized and lateralized left hemisphere IMPRESSION: This study is suggestive of cortical dysfunction in left hemisphere likely secondary to underlying hematoma as well as moderate diffuse encephalopathy, nonspecific etiology. No seizures or definite epileptiform discharges were seen throughout the recording. Priyanka Barbra Sarks      Scheduled Meds: . chlorhexidine  15 mL Mouth/Throat Once   Or  . mouth rinse  15 mL Mouth Rinse Once  . Chlorhexidine Gluconate Cloth  6 each Topical Daily  . dexamethasone  2 mg Intravenous Q8H   Followed by  . [START ON 11/27/2020] dexamethasone  2 mg Intravenous Q12H   Followed by  . [START ON 11/28/2020] dexamethasone  1 mg Intravenous Q12H   Followed by  . [START ON 11/29/2020] dexamethasone  1 mg Intravenous Q24H  . docusate sodium  100 mg Oral BID   . finasteride  5 mg Oral Daily  . heparin  5,000 Units Subcutaneous Q12H  . pantoprazole  40 mg Oral Q supper   Continuous Infusions: . sodium chloride 10 mL/hr at 11/26/20 1400  . niCARDipine    . valproate sodium Stopped (11/26/20 1327)     LOS: 6 days      Debbe Odea, MD [facility] Pager: [email or web address] 11/26/2020, 3:31 PM

## 2020-11-26 NOTE — Plan of Care (Signed)

## 2020-11-26 NOTE — Care Management Important Message (Signed)
Important Message  Patient Details  Name: Cesar [name] MRN: [id number] Date of Birth: April 02, 1929   Medicare Important Message Given:  Yes     Orbie Pyo 11/26/2020, 2:24 PM

## 2020-11-26 NOTE — Progress Notes (Addendum)
Subjective: No changes o/n  Objective: Vital signs in last 24 hours: Temp:  [97.4 F (36.3 C)-99.9 F (37.7 C)] 98.2 F (36.8 C) (02/22 0731) Pulse Rate:  [45-100] 45 (02/22 0731) Resp:  [19-20] 20 (02/22 0731) BP: (104-132)/(69-92) 117/72 (02/22 0731) SpO2:  [95 %-99 %] 95 % (02/22 0731)  Intake/Output from previous day: 02/21 0701 - 02/22 0700 In: 460 [P.O.:360; IV Piggyback:100] Out: 1225 [Urine:1225] Intake/Output this shift: No intake/output data recorded.  Awake, alert.  Follows simple commands but nonverbal.  RUE with no movement.  Lab Results: Recent Labs    11/25/20 1232  WBC 6.4  HGB 12.9*  HCT 40.4  PLT 132*   BMET Recent Labs    11/25/20 1232  NA 135  K 4.4  CL 99  CO2 27  GLUCOSE 127*  BUN 33*  CREATININE 1.54*  CALCIUM 8.8*    Studies/Results: CT HEAD WO CONTRAST  Result Date: 11/25/2020 CLINICAL DATA:  Altered mental status. EXAM: CT HEAD WITHOUT CONTRAST TECHNIQUE: Contiguous axial images were obtained from the base of the skull through the vertex without intravenous contrast. COMPARISON:  November 22, 2020. FINDINGS: Brain: Status post drainage of left subdural hematoma. There is decreased pneumocephalus present in the operative site. However, a stable amount of hemorrhage and fluid remains in the left subdural area compared to prior exam. Stable minimal left-to-right midline shift of 4 mm is noted. Ventricular size is within normal limits. Diffuse cortical atrophy is noted. Mild chronic ischemic white matter disease is noted. No new hemorrhage is noted. Vascular: No hyperdense vessel or unexpected calcification. Skull: Continued presence of 2 left-sided craniotomies for decompression of hematoma. Sinuses/Orbits: No acute finding. Other: None. IMPRESSION: 1. Status post drainage of left subdural hematoma. A stable amount of hemorrhage and fluid remains in the left subdural area compared to prior exam. Stable minimal left-to-right midline shift of 4 mm  is noted. 2. No new hemorrhage is noted. Diffuse cortical atrophy is noted. Mild chronic ischemic white matter disease is noted. Electronically Signed   By: Marijo Conception M.D.   On: 11/25/2020 13:47   MR ANGIO HEAD WO CONTRAST  Result Date: 11/26/2020 CLINICAL DATA:  Possible stroke.  Status post left-sided burr holes. EXAM: MRI HEAD WITHOUT CONTRAST MRA HEAD WITHOUT CONTRAST TECHNIQUE: Multiplanar, multiecho pulse sequences of the brain and surrounding structures were obtained without intravenous contrast. Angiographic images of the head were obtained using MRA technique without contrast. COMPARISON:  03/16/2019 FINDINGS: MRI HEAD FINDINGS Brain: Unchanged thickness of left convexity subdural hematoma. No acute ischemia. There is multifocal hyperintense T2-weighted signal within the white matter. Generalized volume loss without a clear lobar predilection. There are old infarcts of the right frontal lobe and left caudate head. The midline structures are normal. Vascular: Major flow voids are preserved. Skull and upper cervical spine: Normal calvarium and skull base. Visualized upper cervical spine and soft tissues are normal. Sinuses/Orbits:No paranasal sinus fluid levels or advanced mucosal thickening. No mastoid or middle ear effusion. Normal orbits. MRA HEAD FINDINGS POSTERIOR CIRCULATION: --Vertebral arteries: Normal --Inferior cerebellar arteries: Normal. --Basilar artery: Normal. --Superior cerebellar arteries: Normal. --Posterior cerebral arteries: Normal. ANTERIOR CIRCULATION: --Intracranial internal carotid arteries: Normal. --Anterior cerebral arteries (ACA): Normal. --Middle cerebral arteries (MCA): Normal. ANATOMIC VARIANTS: None IMPRESSION: 1. Unchanged thickness of left convexity subdural hematoma. No acute ischemia. 2. Normal intracranial MRA. 3. Old infarcts of the right frontal lobe and left caudate head. 4. Chronic small vessel ischemic changes of the white matter. Electronically Signed  By:  Ulyses Jarred M.D.   On: 11/26/2020 02:45   MR BRAIN WO CONTRAST  Result Date: 11/26/2020 CLINICAL DATA:  Possible stroke.  Status post left-sided burr holes. EXAM: MRI HEAD WITHOUT CONTRAST MRA HEAD WITHOUT CONTRAST TECHNIQUE: Multiplanar, multiecho pulse sequences of the brain and surrounding structures were obtained without intravenous contrast. Angiographic images of the head were obtained using MRA technique without contrast. COMPARISON:  03/16/2019 FINDINGS: MRI HEAD FINDINGS Brain: Unchanged thickness of left convexity subdural hematoma. No acute ischemia. There is multifocal hyperintense T2-weighted signal within the white matter. Generalized volume loss without a clear lobar predilection. There are old infarcts of the right frontal lobe and left caudate head. The midline structures are normal. Vascular: Major flow voids are preserved. Skull and upper cervical spine: Normal calvarium and skull base. Visualized upper cervical spine and soft tissues are normal. Sinuses/Orbits:No paranasal sinus fluid levels or advanced mucosal thickening. No mastoid or middle ear effusion. Normal orbits. MRA HEAD FINDINGS POSTERIOR CIRCULATION: --Vertebral arteries: Normal --Inferior cerebellar arteries: Normal. --Basilar artery: Normal. --Superior cerebellar arteries: Normal. --Posterior cerebral arteries: Normal. ANTERIOR CIRCULATION: --Intracranial internal carotid arteries: Normal. --Anterior cerebral arteries (ACA): Normal. --Middle cerebral arteries (MCA): Normal. ANATOMIC VARIANTS: None IMPRESSION: 1. Unchanged thickness of left convexity subdural hematoma. No acute ischemia. 2. Normal intracranial MRA. 3. Old infarcts of the right frontal lobe and left caudate head. 4. Chronic small vessel ischemic changes of the white matter. Electronically Signed   By: Ulyses Jarred M.D.   On: 11/26/2020 02:45   Portable EEG  Result Date: 11/25/2020 Cesar Havens, MD     11/25/2020  4:57 PM Patient Name: Cesar [name]  MRN: [id number] Epilepsy Attending: Lora [name] Referring Physician/Provider: Dr. Duffy Rhody Date: 11/25/2020 Duration: 25.05 mins Patient history: 87 year old male with left subdural hematoma.  EEG to evaluate for seizures. Level of alertness: lethargic AEDs during EEG study: Depakote Technical aspects: This EEG study was done with scalp electrodes positioned according to the 10-20 International system of electrode placement. Electrical activity was acquired at a sampling rate of 500Hz  and reviewed with a high frequency filter of 70Hz  and a low frequency filter of 1Hz . EEG data were recorded continuously and digitally stored. Description: EEG showed continuous generalized 6 to 9 Hz theta-alpha activity in the right hemisphere as well as 2 to 3 Hz rhythmic delta slowing in left hemisphere which at times appears sharply contoured. Hyperventilation and photic stimulation were not performed.   ABNORMALITY -Continuous slow, generalized and lateralized left hemisphere IMPRESSION: This study is suggestive of cortical dysfunction in left hemisphere likely secondary to underlying hematoma as well as moderate diffuse encephalopathy, nonspecific etiology. No seizures or definite epileptiform discharges were seen throughout the recording. Cesar [name]    Assessment/Plan: 87 yo M s/p chronic SDH drainage, now with worsened right sided weakness and aphasia.  MRI/MRA negative for stroke.  He has a history of seizures, on Depakote - suspect cortical irritability, though patient has not responded to dexamethasone - continue supportive care  Discussed plan of care with daughter at bedside.  [name] 11/26/2020, 12:39 PM

## 2020-11-27 ENCOUNTER — Inpatient Hospital Stay (HOSPITAL_COMMUNITY): Payer: Medicare Other

## 2020-11-27 DIAGNOSIS — I6200 Nontraumatic subdural hemorrhage, unspecified: Secondary | ICD-10-CM | POA: Diagnosis not present

## 2020-11-27 DIAGNOSIS — N1831 Chronic kidney disease, stage 3a: Secondary | ICD-10-CM | POA: Diagnosis not present

## 2020-11-27 DIAGNOSIS — I4891 Unspecified atrial fibrillation: Secondary | ICD-10-CM | POA: Diagnosis not present

## 2020-11-27 DIAGNOSIS — S065X9A Traumatic subdural hemorrhage with loss of consciousness of unspecified duration, initial encounter: Secondary | ICD-10-CM | POA: Diagnosis not present

## 2020-11-27 DIAGNOSIS — R296 Repeated falls: Secondary | ICD-10-CM | POA: Diagnosis not present

## 2020-11-27 LAB — VALPROIC ACID LEVEL: Valproic Acid Lvl: 42 ug/mL — ABNORMAL LOW (ref 50.0–100.0)

## 2020-11-27 NOTE — Progress Notes (Signed)
  Speech Language Pathology Treatment: Dysphagia;Cognitive-Linquistic  Patient Details Name: Cesar [name] MRN: [id number] DOB: [birth year] Today's Date: 11/27/2020 Time: 2426-8341 SLP Time Calculation (min) (ACUTE ONLY): 16 min  Assessment / Plan / Recommendation Clinical Impression  Pt's speech and cognition are significantly different than speech-language-cognitive eval 2/19. Responses were delayed. There was no spontaneous speech and responses were appropriate words however significantly dysarthric with minimal labial movement. Uncertain as to influence of dysarthria versus language versus cognitive impairments this session which therapist will continue to decipher. He stated his name but unable to state wife, daughter or son's name.  Labial spill with cup sip water and holding pudding in oral cavity for 5-10 seconds before propulsion. No cough but potential for decreased laryngeal protection due to oral weakness and reduced control. He cleared oral cavity and self feed using left hand with assist to initiate. Recommend continue Dys 1, thin.   HPI HPI: 87 y.o. male with medical history significant of permanent A. fib, CKD stage III, CVA, seizure disorder, depression, history of DVT, hyperlipidemia, OSA not on CPAP, AAA, ascending thoracic aortic aneurysm, severe aortic stenosis status post TAVR 10/22/2020, nonobstructive CAD presenting to the ED via EMS for evaluation of weakness and multiple falls. Head CT showing acute on chronic left cerebral convexity subdural hematoma. Pt s/p L sided burr hole x2 and placement of subdural drain for evacuation of SDH 11/21/20. On 2/20 pt exhibited aphasia, dysarthria and increased weakness on right. MMRI negative for changes. On continuous EEG.      SLP Plan  Continue with current plan of care       Recommendations  Diet recommendations: Dysphagia 1 (puree);Thin liquid Liquids provided via: Cup Medication Administration: Crushed with puree Supervision:  Staff to assist with self feeding;Full supervision/cueing for compensatory strategies Compensations: Minimize environmental distractions;Slow rate;Lingual sweep for clearance of pocketing;Small sips/bites Postural Changes and/or Swallow Maneuvers: Seated upright 90 degrees                Oral Care Recommendations: Oral care BID Follow up Recommendations: Inpatient Rehab SLP Visit Diagnosis: Dysphagia, unspecified (R13.10);Dysarthria and anarthria (R47.1);Cognitive communication deficit (D62.229) Plan: Continue with current plan of care                       Cesar [name] 11/27/2020, 3:13 PM   Cesar [name].Ed Risk analyst [phone] Office [phone]

## 2020-11-27 NOTE — Progress Notes (Addendum)
Neurology Progress Note  HPI from consult note of 11/26/2020 HPI: Cesar [name] is a 87 y.o. male with a past medical history significant for atrial fibrillation on Eliquis held due to chronic subdural s/p evacuation, hypertension, hyperlipidemia, obstructive sleep apnea not on CPAP, prostate enlargement, total aortic valve replacement for severe aortic stenosis, seizure disorder on Depakote.  He was originally brought in by his son for frequent falls and difficulty with right leg weakness.  Head CT revealed subdural hematoma up to 3 cm in depth with associated mass-effect (5 mm midline shift).  Decision was made to proceed with surgical evacuation which was completed on 11/21/2020.  2/22 he was noted to have reduced communication and increasing weakness of the right upper extremity.  EEG was obtained and was negative for any clear epileptogenic activity.  Repeat head CT was stable.  Medicine was consulted for concern for UTI contributing to his altered mental status and neurology was consulted by internal medicine due to concern for his abrupt decline.  He was noted to have a T-max of 37.7 and was started on Bactrim for concern for UTI on 2/21 as well   S:// Patient seen and examined. Remains moderately aphasic.  No acute events overnight. Remains hooked up to LTM EEG. EEG with cortical dysfunction of the left hemisphere likely to the underlying hematoma on the routine EEG.   O:// Current vital signs: BP 129/82 (BP Location: Left Arm)   Pulse 69   Temp 98.5 F (36.9 C) (Oral)   Resp (!) 21   SpO2 98%  Vital signs in last 24 hours: Temp:  [97.9 F (36.6 C)-98.9 F (37.2 C)] 98.5 F (36.9 C) (02/23 0303) Pulse Rate:  [63-84] 69 (02/23 0303) Resp:  [19-23] 21 (02/23 0303) BP: (101-129)/(76-89) 129/82 (02/23 0303) SpO2:  [97 %-98 %] 98 % (02/23 0303) General: Awake, alert in no distress HEENT: Normocephalic atraumatic Lungs: Clear Cardiovascular: Irregularly irregular Abdomen  nondistended nontender Extremities warm well perfused Neurological examination He is awake alert He follows simple commands only by mimicking. Does not follow verbal commands Upon asking him how he is doing, he tries to speak but he mumbles incoherently. Cranial nerves: Pupils equal round react light, extraocular movements intact, visual fields-there is inconsistent blink to threat bilaterally, facial symmetry is distorted with right lower facial weakness-nasolabial fold flattening on the right, tongue and palate appear midline. Motor exam: He is antigravity in all 4 extremities with 4/5 strength in the right upper and lower extremity.  5/5 in left upper and lower extremity. Sensory exam: Equal grimace to noxious stimulation on both sides. Coordination difficult to assess due to his inability to consistently follow commands.   Medications  Current Facility-Administered Medications:  .  0.9 %  sodium chloride infusion, , Intravenous, Continuous, [name], MD, Last Rate: 10 mL/hr at 11/26/20 1400, Infusion Verify at 11/26/20 1400 .  acetaminophen (TYLENOL) tablet 650 mg, 650 mg, Oral, Q6H PRN, 650 mg at 11/25/20 0535 **OR** acetaminophen (TYLENOL) suppository 650 mg, 650 mg, Rectal, Q6H PRN, [name], MD .  chlorhexidine (PERIDEX) 0.12 % solution 15 mL, 15 mL, Mouth/Throat, Once **OR** MEDLINE mouth rinse, 15 mL, Mouth Rinse, Once, [name], MD .  Chlorhexidine Gluconate Cloth 2 % PADS 6 each, 6 each, Topical, Daily, Debbe Odea, MD, 6 each at 11/26/20 1003 .  [COMPLETED] dexamethasone (DECADRON) injection 4 mg, 4 mg, Intravenous, Q8H, 4 mg at 11/26/20 1529 **FOLLOWED BY** dexamethasone (DECADRON) injection 2 mg, 2 mg, Intravenous, Q8H,  2 mg at 11/27/20 0511 **FOLLOWED BY** dexamethasone (DECADRON) injection 2 mg, 2 mg, Intravenous, Q12H **FOLLOWED BY** [START ON 11/28/2020] dexamethasone (DECADRON) injection 1 mg, 1 mg, Intravenous, Q12H **FOLLOWED BY** [START ON  11/29/2020] dexamethasone (DECADRON) injection 1 mg, 1 mg, Intravenous, Q24H, [name], MD .  docusate sodium (COLACE) capsule 100 mg, 100 mg, Oral, BID, [name], MD, 100 mg at 11/25/20 1000 .  enalaprilat (VASOTEC) injection 1.25 mg, 1.25 mg, Intravenous, Q6H PRN, Dawley, Troy C, DO, 1.25 mg at 11/23/20 0313 .  finasteride (PROSCAR) tablet 5 mg, 5 mg, Oral, Daily, [name], MD, 5 mg at 11/25/20 1000 .  heparin injection 5,000 Units, 5,000 Units, Subcutaneous, Q12H, [name], MD, 5,000 Units at 11/26/20 2127 .  labetalol (NORMODYNE) injection 10 mg, 10 mg, Intravenous, Q10 min PRN, [name], MD, 10 mg at 11/23/20 0902 .  morphine 2 MG/ML injection 2 mg, 2 mg, Intravenous, Q2H PRN, [name], MD .  nicardipine (CARDENE) 20mg  in 0.86% saline 221ml IV infusion (0.1 mg/ml), 3-15 mg/hr, Intravenous, Continuous, [name], MD .  ondansetron [facility]) tablet 4 mg, 4 mg, Oral, Q4H PRN **OR** ondansetron (ZOFRAN) injection 4 mg, 4 mg, Intravenous, Q4H PRN, [name], MD .  pantoprazole (PROTONIX) EC tablet 40 mg, 40 mg, Oral, Q supper, Alvira Philips, [place], 40 mg at 11/26/20 1735 .  polyethylene glycol (MIRALAX / GLYCOLAX) packet 17 g, 17 g, Oral, Daily PRN, [name], MD, 17 g at 11/25/20 0535 .  promethazine (PHENERGAN) tablet 12.5-25 mg, 12.5-25 mg, Oral, Q4H PRN, [name], MD .  sodium phosphate (FLEET) 7-19 GM/118ML enema 1 enema, 1 enema, Rectal, Once PRN, [name], MD .  valproate (DEPACON) 250 mg in dextrose 5 % 50 mL IVPB, 250 mg, Intravenous, Q6H, Bhagat, Srishti L, MD, Last Rate: 52.5 mL/hr at 11/27/20 0512, 250 mg at 11/27/20 0512 Labs CBC    Component Value Date/Time   WBC 6.4 11/25/2020 1232   RBC 4.11 (L) 11/25/2020 1232   HGB 12.9 (L) 11/25/2020 1232   HCT 40.4 11/25/2020 1232   PLT 132 (L) 11/25/2020 1232   MCV 98.3 11/25/2020 1232   MCH 31.4 11/25/2020 1232   MCHC 31.9  11/25/2020 1232   RDW 13.0 11/25/2020 1232   LYMPHSABS 1.0 11/25/2020 1232   MONOABS 0.6 11/25/2020 1232   EOSABS 0.3 11/25/2020 1232   BASOSABS 0.0 11/25/2020 1232    CMP     Component Value Date/Time   NA 135 11/25/2020 1232   NA 141 10/30/2020 1520   K 4.4 11/25/2020 1232   CL 99 11/25/2020 1232   CO2 27 11/25/2020 1232   GLUCOSE 127 (H) 11/25/2020 1232   BUN 33 (H) 11/25/2020 1232   BUN 28 (H) 10/30/2020 1520   CREATININE 1.54 (H) 11/25/2020 1232   CALCIUM 8.8 (L) 11/25/2020 1232   PROT 6.6 11/19/2020 1751   PROT 6.7 08/12/2020 1137   ALBUMIN 3.1 (L) 11/19/2020 1751   ALBUMIN 4.2 08/12/2020 1137   AST 28 11/19/2020 1751   ALT 22 11/19/2020 1751   ALKPHOS 59 11/19/2020 1751   BILITOT 0.9 11/19/2020 1751   BILITOT 0.9 08/12/2020 1137   GFRNONAA 43 (L) 11/25/2020 1232   GFRAA 57 (L) 10/30/2020 1520    Imaging I have reviewed images in epic and the results pertinent to this consultation are: CT head with stable postsurgical left subdural hematoma.  MRI brain with stable subdural hematoma no  acute intracranial process.  Spot EEG with left-sided cortical dysfunction likely secondary to the underlying hematoma.  No seizure activity. LTM EEG report pending.  Assessment: 87 year old man with past medical history significant for cerebrovascular risk factors with recent evacuation of chronic subdural hematoma and off of anticoagulation due to the hematoma, with worsening right-sided weakness and aphasia.   Presumably he was able to talk better postsurgically but has worsened since. I presume part of this is the cortical dysfunction from the subdural hematoma on the left cerebral convexity but we are looking at the long-term EEG to rule out underlying electrographic seizures which might be contributing to this.  Also questionable UTI which might be contributing.  Impression: Left-sided subdural hematoma Evaluate for seizures History of A. fib and off of anticoagulation due to  subdural hematoma -MRI negative for acute stroke  Recommendations: Pending LTM read-decision on continuation based on the read. Increased Depakote yesterday to 250 every 6-continue that. Depakote level remains subtherapeutic at 42 but I would hate to give him a load for the fear of more sedation and worsening of his examination. Chest x-ray to rule out any kind of aspiration pneumonia which may be contributing to worsening mentation. We will discuss with the epileptologist and update recommendations as needed. We will follow the LTM read.  -- Amie Portland, MD Neurologist [facility] Pager: [phone]   Addendum EEG with continuing cortical dysfunction left hemisphere likely secondary to underlying hematoma as well as diffuse moderate encephalopathy-nonspecific etiology.  No seizures or epileptiform discharges. Discontinue LTM Anticipate mentation will show improvement with time and as the hematoma resolves. He still has a considerable residual hematoma and local mass-effect on the left hemisphere, which is probably causing his symptoms.

## 2020-11-27 NOTE — Progress Notes (Signed)
PROGRESS NOTE    Cesar [name]   QTM:[id number]  DOB: 12-Jan-1929  DOA: 11/19/2020 PCP: Orpah Melter, MD   Brief Narrative:  Cesar [name]  is a 87 y.o. male with medical history significant of permanent A. fib on Eliquis, CKD stage III, CVA, seizure disorder, depression, history of DVT, hyperlipidemia, OSA not on CPAP, AAA, ascending thoracic aortic aneurysm followed by Dr. [name], severe aortic stenosis status post TAVR 10/22/2020, nonobstructive CAD presenting to the ED via EMS for evaluation of weakness and multiple falls.   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  There is associated mass-effect with 5 mm left to right midline shift. The ED physician discussed the case with neurosurgery.  Neurosurgeon recommended to hold Eliquis but not reverse it.  The patient was admitted to the hospital the plan to repeat the CT scan in the morning.  Subjective: Non communicative.     Assessment & Plan:   Principal Problem:   SDH (subdural hematoma) - s/p burr hole x 2 on 2/17- holding Eliquis  Active Problems:  Decreased level of consciousness - per daughter, this was noted on the AM of 2/21- further work up included brain imaging and not acute findings were noted - EEG > cortical dysfunction in left hemisphere likely secondary to underlying hematoma as well as moderate diffuse encephalopathy, nonspecific etiology - he has undergone a long term EEG- no seizure activity noted - he has had an episode in the past that was suspected to be a  focal seizure and he was started on 500 mg of Depakote QHS which he was taking regularly - his Depakote level was low and is now his dose of Depakote is being increased to reach a therapeutic level - UA not strongly suggestive of UTI and culture shows not growth so far - cont to follow for improvement in mental status   CKD (chronic kidney disease), stage III   - baseline ~  1.2- 1.40 - recheck tomorrow AM    Atrial fibrillation  -CHA2DS2-VASc Score at least 3 - Xarelto on hold due to intracranial bleed -- follow on telemetry - not on a rate controlling agent  S/p TAVR - holding Xarelto and ASA  BPH - cont Proscar  Severe hearing loss     Time spent in minutes: 35 DVT prophylaxis: SCDs Code Status: Full code Family Communication: wife Level of Care: Level of care: Progressive Disposition Plan:  Status is: Observation  The patient will require care spanning > 2 midnights and should be moved to inpatient because: Inpatient level of care appropriate due to severity of illness  Dispo: The patient is from: Home              Anticipated d/c is to: TBD              Anticipated d/c date is: > 3 days              Patient currently is not medically stable to d/c.   Difficult to place patient No      Consultants:   NS Procedures:   none Antimicrobials:  Anti-infectives (From admission, onward)   Start     Dose/Rate Route Frequency Ordered Stop   11/25/20 2200  sulfamethoxazole-trimethoprim (BACTRIM) 160 mg in dextrose 5 % 250 mL IVPB  Status:  Discontinued        160 mg 260 mL/hr over 60 Minutes Intravenous Every 12 hours  11/25/20 2111 11/25/20 2116   11/25/20 1500  sulfamethoxazole-trimethoprim (BACTRIM DS) 800-160 MG per tablet 1 tablet  Status:  Discontinued        1 tablet Oral Every 12 hours 11/25/20 1409 11/25/20 2116   11/21/20 2330  ceFAZolin (ANCEF) IVPB 1 g/50 mL premix        1 g 100 mL/hr over 30 Minutes Intravenous Every 8 hours 11/21/20 1938 11/23/20 1607       Objective: Vitals:   11/26/20 2312 11/27/20 0303 11/27/20 0705 11/27/20 1057  BP: 122/76 129/82 122/63 115/61  Pulse: 81 69 (!) 59 (!) 58  Resp: 19 (!) 21 18 19   Temp:  98.5 F (36.9 C) 98.3 F (36.8 C) 98.2 F (36.8 C)  TempSrc:  Oral Oral Oral  SpO2:  98% 98% 98%    Intake/Output Summary (Last 24 hours) at 11/27/2020 1440 Last data filed at 11/27/2020  0830 Gross per 24 hour  Intake 612.5 ml  Output 125 ml  Net 487.5 ml   There were no vitals filed for this visit.  Examination: General exam: Appears comfortable  HEENT: PERRLA, oral mucosa moist, no sclera icterus or thrush Respiratory system: Clear to auscultation. Respiratory effort normal. Cardiovascular system: S1 & S2 heard, regular rate and rhythm Gastrointestinal system: Abdomen soft, non-tender, nondistended. Normal bowel sounds   Central nervous system: not communicative-will open his eyes but does not follow commands Extremities: No cyanosis, clubbing or edema Skin: No rashes or ulcers Psychiatry:  cannot assess    Data Reviewed: I have personally reviewed following labs and imaging studies  CBC: Recent Labs  Lab 11/25/20 1232  WBC 6.4  NEUTROABS 4.5  HGB 12.9*  HCT 40.4  MCV 98.3  PLT 710*   Basic Metabolic Panel: Recent Labs  Lab 11/25/20 1232  NA 135  K 4.4  CL 99  CO2 27  GLUCOSE 127*  BUN 33*  CREATININE 1.54*  CALCIUM 8.8*   GFR: Estimated Creatinine Clearance: 36.8 mL/min (A) (by C-G formula based on SCr of 1.54 mg/dL (H)). Liver Function Tests: No results for input(s): AST, ALT, ALKPHOS, BILITOT, PROT, ALBUMIN in the last 168 hours. No results for input(s): LIPASE, AMYLASE in the last 168 hours. No results for input(s): AMMONIA in the last 168 hours. Coagulation Profile: No results for input(s): INR, PROTIME in the last 168 hours. Cardiac Enzymes: No results for input(s): CKTOTAL, CKMB, CKMBINDEX, TROPONINI in the last 168 hours. BNP (last 3 results) No results for input(s): PROBNP in the last 8760 hours. HbA1C: No results for input(s): HGBA1C in the last 72 hours. CBG: Recent Labs  Lab 11/20/20 2135 11/21/20 0615 11/21/20 1213 11/21/20 1948  GLUCAP 141* 93 98 123*   Lipid Profile: No results for input(s): CHOL, HDL, LDLCALC, TRIG, CHOLHDL, LDLDIRECT in the last 72 hours. Thyroid Function Tests: No results for input(s): TSH,  T4TOTAL, FREET4, T3FREE, THYROIDAB in the last 72 hours. Anemia Panel: No results for input(s): VITAMINB12, FOLATE, FERRITIN, TIBC, IRON, RETICCTPCT in the last 72 hours. Urine analysis:    Component Value Date/Time   COLORURINE YELLOW 11/25/2020 [place] 11/25/2020 1214   LABSPEC 1.017 11/25/2020 1214   PHURINE 6.0 11/25/2020 1214   GLUCOSEU NEGATIVE 11/25/2020 1214   HGBUR NEGATIVE 11/25/2020 1214   BILIRUBINUR NEGATIVE 11/25/2020 1214   KETONESUR NEGATIVE 11/25/2020 1214   PROTEINUR NEGATIVE 11/25/2020 1214   UROBILINOGEN 2.0 (H) 01/01/2012 1328   NITRITE NEGATIVE 11/25/2020 1214   LEUKOCYTESUR TRACE (A) 11/25/2020 1214  Sepsis Labs: @LABRCNTIP (procalcitonin:4,lacticidven:4) ) Recent Results (from the past 240 hour(s))  SARS CORONAVIRUS 2 (TAT 6-24 HRS) Nasopharyngeal Nasopharyngeal Swab     Status: None   Collection Time: 11/19/20  8:13 PM   Specimen: Nasopharyngeal Swab  Result Value Ref Range Status   SARS Coronavirus 2 NEGATIVE NEGATIVE Final    Comment: (NOTE) SARS-CoV-2 target nucleic acids are NOT DETECTED.  The SARS-CoV-2 RNA is generally detectable in upper and lower respiratory specimens during the acute phase of infection. Negative results do not preclude SARS-CoV-2 infection, do not rule out co-infections with other pathogens, and should not be used as the sole basis for treatment or other patient management decisions. Negative results must be combined with clinical observations, patient history, and epidemiological information. The expected result is Negative.  Fact Sheet for Patients: SugarRoll.be  Fact Sheet for Healthcare Providers: https://www.woods-mathews.com/  This test is not yet approved or cleared by the [place] FDA and  has been authorized for detection and/or diagnosis of SARS-CoV-2 by FDA under an Emergency Use Authorization (EUA). This EUA will remain  in effect (meaning this  test can be used) for the duration of the COVID-19 declaration under Se ction 564(b)(1) of the Act, 21 U.S.C. section 360bbb-3(b)(1), unless the authorization is terminated or revoked sooner.  Performed at [facility], [place] [address] [place], [place] [address]          Radiology Studies: DG Chest 1 View  Result Date: 11/27/2020 CLINICAL DATA:  Aspiration pneumonia. EXAM: CHEST  1 VIEW COMPARISON:  November 19, 2020. FINDINGS: Stable cardiomegaly. No pneumothorax or pleural effusion is noted. Both lungs are clear. The visualized skeletal structures are unremarkable. IMPRESSION: No active disease. Aortic Atherosclerosis (ICD10-I70.0). Electronically Signed   By: Marijo Conception M.D.   On: 11/27/2020 10:36   MR ANGIO HEAD WO CONTRAST  Result Date: 11/26/2020 CLINICAL DATA:  Possible stroke.  Status post left-sided burr holes. EXAM: MRI HEAD WITHOUT CONTRAST MRA HEAD WITHOUT CONTRAST TECHNIQUE: Multiplanar, multiecho pulse sequences of the brain and surrounding structures were obtained without intravenous contrast. Angiographic images of the head were obtained using MRA technique without contrast. COMPARISON:  03/16/2019 FINDINGS: MRI HEAD FINDINGS Brain: Unchanged thickness of left convexity subdural hematoma. No acute ischemia. There is multifocal hyperintense T2-weighted signal within the white matter. Generalized volume loss without a clear lobar predilection. There are old infarcts of the right frontal lobe and left caudate head. The midline structures are normal. Vascular: Major flow voids are preserved. Skull and upper cervical spine: Normal calvarium and skull base. Visualized upper cervical spine and soft tissues are normal. Sinuses/Orbits:No paranasal sinus fluid levels or advanced mucosal thickening. No mastoid or middle ear effusion. Normal orbits. MRA HEAD FINDINGS POSTERIOR CIRCULATION: --Vertebral arteries: Normal --Inferior cerebellar arteries: Normal. --Basilar artery:  Normal. --Superior cerebellar arteries: Normal. --Posterior cerebral arteries: Normal. ANTERIOR CIRCULATION: --Intracranial internal carotid arteries: Normal. --Anterior cerebral arteries (ACA): Normal. --Middle cerebral arteries (MCA): Normal. ANATOMIC VARIANTS: None IMPRESSION: 1. Unchanged thickness of left convexity subdural hematoma. No acute ischemia. 2. Normal intracranial MRA. 3. Old infarcts of the right frontal lobe and left caudate head. 4. Chronic small vessel ischemic changes of the white matter. Electronically Signed   By: Ulyses Jarred M.D.   On: 11/26/2020 02:45   MR BRAIN WO CONTRAST  Result Date: 11/26/2020 CLINICAL DATA:  Possible stroke.  Status post left-sided burr holes. EXAM: MRI HEAD WITHOUT CONTRAST MRA HEAD WITHOUT CONTRAST TECHNIQUE: Multiplanar, multiecho pulse sequences of the brain and  surrounding structures were obtained without intravenous contrast. Angiographic images of the head were obtained using MRA technique without contrast. COMPARISON:  03/16/2019 FINDINGS: MRI HEAD FINDINGS Brain: Unchanged thickness of left convexity subdural hematoma. No acute ischemia. There is multifocal hyperintense T2-weighted signal within the white matter. Generalized volume loss without a clear lobar predilection. There are old infarcts of the right frontal lobe and left caudate head. The midline structures are normal. Vascular: Major flow voids are preserved. Skull and upper cervical spine: Normal calvarium and skull base. Visualized upper cervical spine and soft tissues are normal. Sinuses/Orbits:No paranasal sinus fluid levels or advanced mucosal thickening. No mastoid or middle ear effusion. Normal orbits. MRA HEAD FINDINGS POSTERIOR CIRCULATION: --Vertebral arteries: Normal --Inferior cerebellar arteries: Normal. --Basilar artery: Normal. --Superior cerebellar arteries: Normal. --Posterior cerebral arteries: Normal. ANTERIOR CIRCULATION: --Intracranial internal carotid arteries: Normal.  --Anterior cerebral arteries (ACA): Normal. --Middle cerebral arteries (MCA): Normal. ANATOMIC VARIANTS: None IMPRESSION: 1. Unchanged thickness of left convexity subdural hematoma. No acute ischemia. 2. Normal intracranial MRA. 3. Old infarcts of the right frontal lobe and left caudate head. 4. Chronic small vessel ischemic changes of the white matter. Electronically Signed   By: Ulyses Jarred M.D.   On: 11/26/2020 02:45   Overnight EEG with video  Result Date: 11/27/2020 Lora Havens, MD     11/27/2020 11:04 AM Patient Name: Cesar [name] MRN: [id number] Epilepsy Attending: Lora Havens Referring Physician/Provider: Dr. Amie Portland Duration: 11/26/2020 4098 to 11/27/2020 1191  Patient history: 87 year old male with left subdural hematoma.  EEG to evaluate for seizures.  Level of alertness: awake, asleep  AEDs during EEG study: Depakote  Technical aspects: This EEG study was done with scalp electrodes positioned according to the 10-20 International system of electrode placement. Electrical activity was acquired at a sampling rate of 500Hz  and reviewed with a high frequency filter of 70Hz  and a low frequency filter of 1Hz . EEG data were recorded continuously and digitally stored.  Description: During awake state, no clear posterior dominant rhythm was seen.  Sleep was characterized by sleep spindles (12 to 14 Hz), maximal frontocentral region. EEG showed continuous generalized 6 to 9 Hz theta-alpha activity in the right hemisphere as well as 2 to 3 Hz rhythmic delta slowing in left hemisphere which at times appears sharply contoured. Hyperventilation and photic stimulation were not performed.    ABNORMALITY -Continuous slow, generalized and lateralized left hemisphere  IMPRESSION: This study is suggestive of cortical dysfunction in left hemisphere likely secondary to underlying hematoma as well as moderate diffuse encephalopathy, nonspecific etiology. No seizures or definite epileptiform  discharges were seen throughout the recording.  Lora Havens   Portable EEG  Result Date: 11/25/2020 Lora Havens, MD     11/25/2020  4:57 PM Patient Name: Cesar [name] MRN: [id number] Epilepsy Attending: Lora Havens Referring Physician/Provider: Dr. Duffy Rhody Date: 11/25/2020 Duration: 25.05 mins Patient history: 87 year old male with left subdural hematoma.  EEG to evaluate for seizures. Level of alertness: lethargic AEDs during EEG study: Depakote Technical aspects: This EEG study was done with scalp electrodes positioned according to the 10-20 International system of electrode placement. Electrical activity was acquired at a sampling rate of 500Hz  and reviewed with a high frequency filter of 70Hz  and a low frequency filter of 1Hz . EEG data were recorded continuously and digitally stored. Description: EEG showed continuous generalized 6 to 9 Hz theta-alpha activity in the right hemisphere as well as 2 to 3 Hz rhythmic delta slowing in  left hemisphere which at times appears sharply contoured. Hyperventilation and photic stimulation were not performed.   ABNORMALITY -Continuous slow, generalized and lateralized left hemisphere IMPRESSION: This study is suggestive of cortical dysfunction in left hemisphere likely secondary to underlying hematoma as well as moderate diffuse encephalopathy, nonspecific etiology. No seizures or definite epileptiform discharges were seen throughout the recording. Priyanka Barbra Sarks      Scheduled Meds: . chlorhexidine  15 mL Mouth/Throat Once   Or  . mouth rinse  15 mL Mouth Rinse Once  . Chlorhexidine Gluconate Cloth  6 each Topical Daily  . dexamethasone  2 mg Intravenous Q12H   Followed by  . [START ON 11/28/2020] dexamethasone  1 mg Intravenous Q12H   Followed by  . [START ON 11/29/2020] dexamethasone  1 mg Intravenous Q24H  . docusate sodium  100 mg Oral BID  . finasteride  5 mg Oral Daily  . heparin  5,000 Units Subcutaneous Q12H  .  pantoprazole  40 mg Oral Q supper   Continuous Infusions: . sodium chloride 10 mL/hr at 11/26/20 1400  . niCARDipine    . valproate sodium 250 mg (11/27/20 1133)     LOS: 7 days      Debbe Odea, MD [facility] Pager: [email or web address] 11/27/2020, 2:40 PM

## 2020-11-27 NOTE — Progress Notes (Signed)
LTM EEG discontinued - no skin breakdown at unhook.   

## 2020-11-27 NOTE — Progress Notes (Signed)
Physical Therapy Treatment Patient Details Name: Cesar [name] MRN: [id number] DOB: 06/14/1929 Today's Date: 11/27/2020    History of Present Illness Pt is 87 y.o. male with medical history significant of permanent A. fib on Eliquis, CKD stage III, CVA, seizure disorder, depression, history of DVT, hyperlipidemia, OSA not on CPAP, AAA, ascending thoracic aortic aneurysm followed by Dr. [name], severe aortic stenosis status post TAVR 10/22/2020, nonobstructive CAD presenting to the ED via EMS for evaluation of weakness and multiple falls. Head CT showing acute on chronic left cerebral convexity subdural hematoma. Pt s/p L sided burr hole x2 and placement of subdural drain for evacuation of SDH 11/21/20.  Drain has been removed.  Pt with decline in mental status and R UE weakness 2/21 - MRI was negative for acute changes, Pt with hx of seizures - now on continuous EEG.    PT Comments    Pt still limited to the bed today.  Some mild improvements noted over progress notes from yesterday's status.  Emphasis on resistive Upper and LE exercise where appropriate.   Follow Up Recommendations  CIR;Supervision for mobility/OOB     Equipment Recommendations  Wheelchair (measurements PT);Wheelchair cushion (measurements PT);Hospital bed    Recommendations for Other Services       Precautions / Restrictions Precautions Precautions: Fall Precaution Comments: R inattention HOH with hearing aides ; continuous EEG Restrictions Other Position/Activity Restrictions: very HOH    Mobility  Bed Mobility                    Transfers                    Ambulation/Gait                 Stairs             Wheelchair Mobility    Modified Rankin (Stroke Patients Only)       Balance                                            Cognition Arousal/Alertness: Awake/alert Behavior During Therapy: Flat affect Overall Cognitive Status: Difficult to  assess Area of Impairment: Attention                   Current Attention Level: Sustained   Following Commands: Follows one step commands inconsistently;Follows one step commands with increased time Safety/Judgement: Decreased awareness of safety;Decreased awareness of deficits Awareness: Intellectual Problem Solving: Slow processing;Requires tactile cues;Difficulty sequencing        Exercises General Exercises - Lower Extremity Heel Slides: AROM;Strengthening;Both;10 reps;Supine (graded resistance in extention bil) Hip ABduction/ADduction: AROM;AAROM;Both;10 reps;Supine Straight Leg Raises: AROM;Both;10 reps;Supine Hip Flexion/Marching: AROM;10 reps;Supine;Both Other Exercises Other Exercises: graded-resisted bil bicep/tricep pressess with extra time-- v/t cues for R sided Other Exercises: bil shoulder flexion "reach to the ceiling" x10, R a/aa, L AROM    General Comments        Pertinent Vitals/Pain Pain Assessment: Faces Faces Pain Scale: No hurt Pain Intervention(s): Monitored during session    Home Living                      Prior Function            PT Goals (current goals can now be found in the care plan section) Acute Rehab PT Goals Patient Stated  Goal: none stated PT Goal Formulation: With patient/family Time For Goal Achievement: 12/06/20 Potential to Achieve Goals: Good Progress towards PT goals: Progressing toward goals (limited by continuous EEG)    Frequency    Min 4X/week      PT Plan Current plan remains appropriate    Co-evaluation              AM-PAC PT "6 Clicks" Mobility   Outcome Measure  Help needed turning from your back to your side while in a flat bed without using bedrails?: A Little Help needed moving from lying on your back to sitting on the side of a flat bed without using bedrails?: A Lot Help needed moving to and from a bed to a chair (including a wheelchair)?: A Lot Help needed standing up from a  chair using your arms (e.g., wheelchair or bedside chair)?: A Lot Help needed to walk in hospital room?: Total Help needed climbing 3-5 steps with a railing? : Total 6 Click Score: 11    End of Session         PT Visit Diagnosis: Unsteadiness on feet (R26.81);Other abnormalities of gait and mobility (R26.89);Muscle weakness (generalized) (M62.81);Other symptoms and signs involving the nervous system (Y56.389)     Time: 3734-2876 PT Time Calculation (min) (ACUTE ONLY): 25 min  Charges:  $Therapeutic Exercise: 23-37 mins                     11/27/2020  Ginger Carne., PT Acute Rehabilitation Services [phone]  (pager) [phone]  (office)   Tessie Fass Braelyn Jenson 11/27/2020, 12:45 PM

## 2020-11-27 NOTE — Procedures (Signed)
Patient Name: Cesar [name]  MRN: [id number]  Epilepsy Attending: Lora Havens  Referring Physician/Provider: Dr. Amie Portland Duration: 11/26/2020 0352 to 11/27/2020 4818  Patient history: 87 year old male with left subdural hematoma.  EEG to evaluate for seizures.  Level of alertness: awake, asleep  AEDs during EEG study: Depakote  Technical aspects: This EEG study was done with scalp electrodes positioned according to the 10-20 International system of electrode placement. Electrical activity was acquired at a sampling rate of 500Hz  and reviewed with a high frequency filter of 70Hz  and a low frequency filter of 1Hz . EEG data were recorded continuously and digitally stored.   Description: During awake state, no clear posterior dominant rhythm was seen.  Sleep was characterized by sleep spindles (12 to 14 Hz), maximal frontocentral region. EEG showed continuous generalized 6 to 9 Hz theta-alpha activity in the right hemisphere as well as 2 to 3 Hz rhythmic delta slowing in left hemisphere which at times appears sharply contoured. Hyperventilation and photic stimulation were not performed.     ABNORMALITY -Continuous slow, generalized and lateralized left hemisphere  IMPRESSION: This study is suggestive of cortical dysfunction in left hemisphere likely secondary to underlying hematoma as well as moderate diffuse encephalopathy, nonspecific etiology. No seizures or definite epileptiform discharges were seen throughout the recording.  Tal Kempker Barbra Sarks

## 2020-11-27 NOTE — Progress Notes (Signed)
Subjective: NAEs o/n  Objective: Vital signs in last 24 hours: Temp:  [97.9 F (36.6 C)-98.9 F (37.2 C)] 98.2 F (36.8 C) (02/23 1057) Pulse Rate:  [58-84] 58 (02/23 1057) Resp:  [18-23] 19 (02/23 1057) BP: (101-129)/(61-89) 115/61 (02/23 1057) SpO2:  [97 %-98 %] 98 % (02/23 1057)  Intake/Output from previous day: 02/22 0701 - 02/23 0700 In: 952.5 [P.O.:900; IV Piggyback:52.5] Out: 325 [Urine:325] Intake/Output this shift: Total I/O In: 120 [P.O.:120] Out: -   Awake, alert, makes sounds but not conversant.  Follows commands x 4, antigravity now with RUE. Dressings c/d  Lab Results: Recent Labs    11/25/20 1232  WBC 6.4  HGB 12.9*  HCT 40.4  PLT 132*   BMET Recent Labs    11/25/20 1232  NA 135  K 4.4  CL 99  CO2 27  GLUCOSE 127*  BUN 33*  CREATININE 1.54*  CALCIUM 8.8*    Studies/Results: DG Chest 1 View  Result Date: 11/27/2020 CLINICAL DATA:  Aspiration pneumonia. EXAM: CHEST  1 VIEW COMPARISON:  November 19, 2020. FINDINGS: Stable cardiomegaly. No pneumothorax or pleural effusion is noted. Both lungs are clear. The visualized skeletal structures are unremarkable. IMPRESSION: No active disease. Aortic Atherosclerosis (ICD10-I70.0). Electronically Signed   By: Marijo Conception M.D.   On: 11/27/2020 10:36   CT HEAD WO CONTRAST  Result Date: 11/25/2020 CLINICAL DATA:  Altered mental status. EXAM: CT HEAD WITHOUT CONTRAST TECHNIQUE: Contiguous axial images were obtained from the base of the skull through the vertex without intravenous contrast. COMPARISON:  November 22, 2020. FINDINGS: Brain: Status post drainage of left subdural hematoma. There is decreased pneumocephalus present in the operative site. However, a stable amount of hemorrhage and fluid remains in the left subdural area compared to prior exam. Stable minimal left-to-right midline shift of 4 mm is noted. Ventricular size is within normal limits. Diffuse cortical atrophy is noted. Mild chronic  ischemic white matter disease is noted. No new hemorrhage is noted. Vascular: No hyperdense vessel or unexpected calcification. Skull: Continued presence of 2 left-sided craniotomies for decompression of hematoma. Sinuses/Orbits: No acute finding. Other: None. IMPRESSION: 1. Status post drainage of left subdural hematoma. A stable amount of hemorrhage and fluid remains in the left subdural area compared to prior exam. Stable minimal left-to-right midline shift of 4 mm is noted. 2. No new hemorrhage is noted. Diffuse cortical atrophy is noted. Mild chronic ischemic white matter disease is noted. Electronically Signed   By: Marijo Conception M.D.   On: 11/25/2020 13:47   MR ANGIO HEAD WO CONTRAST  Result Date: 11/26/2020 CLINICAL DATA:  Possible stroke.  Status post left-sided burr holes. EXAM: MRI HEAD WITHOUT CONTRAST MRA HEAD WITHOUT CONTRAST TECHNIQUE: Multiplanar, multiecho pulse sequences of the brain and surrounding structures were obtained without intravenous contrast. Angiographic images of the head were obtained using MRA technique without contrast. COMPARISON:  03/16/2019 FINDINGS: MRI HEAD FINDINGS Brain: Unchanged thickness of left convexity subdural hematoma. No acute ischemia. There is multifocal hyperintense T2-weighted signal within the white matter. Generalized volume loss without a clear lobar predilection. There are old infarcts of the right frontal lobe and left caudate head. The midline structures are normal. Vascular: Major flow voids are preserved. Skull and upper cervical spine: Normal calvarium and skull base. Visualized upper cervical spine and soft tissues are normal. Sinuses/Orbits:No paranasal sinus fluid levels or advanced mucosal thickening. No mastoid or middle ear effusion. Normal orbits. MRA HEAD FINDINGS POSTERIOR CIRCULATION: --Vertebral arteries: Normal --Inferior cerebellar  arteries: Normal. --Basilar artery: Normal. --Superior cerebellar arteries: Normal. --Posterior cerebral  arteries: Normal. ANTERIOR CIRCULATION: --Intracranial internal carotid arteries: Normal. --Anterior cerebral arteries (ACA): Normal. --Middle cerebral arteries (MCA): Normal. ANATOMIC VARIANTS: None IMPRESSION: 1. Unchanged thickness of left convexity subdural hematoma. No acute ischemia. 2. Normal intracranial MRA. 3. Old infarcts of the right frontal lobe and left caudate head. 4. Chronic small vessel ischemic changes of the white matter. Electronically Signed   By: Ulyses Jarred M.D.   On: 11/26/2020 02:45   MR BRAIN WO CONTRAST  Result Date: 11/26/2020 CLINICAL DATA:  Possible stroke.  Status post left-sided burr holes. EXAM: MRI HEAD WITHOUT CONTRAST MRA HEAD WITHOUT CONTRAST TECHNIQUE: Multiplanar, multiecho pulse sequences of the brain and surrounding structures were obtained without intravenous contrast. Angiographic images of the head were obtained using MRA technique without contrast. COMPARISON:  03/16/2019 FINDINGS: MRI HEAD FINDINGS Brain: Unchanged thickness of left convexity subdural hematoma. No acute ischemia. There is multifocal hyperintense T2-weighted signal within the white matter. Generalized volume loss without a clear lobar predilection. There are old infarcts of the right frontal lobe and left caudate head. The midline structures are normal. Vascular: Major flow voids are preserved. Skull and upper cervical spine: Normal calvarium and skull base. Visualized upper cervical spine and soft tissues are normal. Sinuses/Orbits:No paranasal sinus fluid levels or advanced mucosal thickening. No mastoid or middle ear effusion. Normal orbits. MRA HEAD FINDINGS POSTERIOR CIRCULATION: --Vertebral arteries: Normal --Inferior cerebellar arteries: Normal. --Basilar artery: Normal. --Superior cerebellar arteries: Normal. --Posterior cerebral arteries: Normal. ANTERIOR CIRCULATION: --Intracranial internal carotid arteries: Normal. --Anterior cerebral arteries (ACA): Normal. --Middle cerebral arteries  (MCA): Normal. ANATOMIC VARIANTS: None IMPRESSION: 1. Unchanged thickness of left convexity subdural hematoma. No acute ischemia. 2. Normal intracranial MRA. 3. Old infarcts of the right frontal lobe and left caudate head. 4. Chronic small vessel ischemic changes of the white matter. Electronically Signed   By: Ulyses Jarred M.D.   On: 11/26/2020 02:45   Overnight EEG with video  Result Date: 11/27/2020 Lora Havens, MD     11/27/2020 11:04 AM Patient Name: Cesar [name] MRN: [id number] Epilepsy Attending: Lora Havens Referring Physician/Provider: Dr. Amie [place] Duration: 11/26/2020 9326 to 11/27/2020 7124  Patient history: 87 year old male with left subdural hematoma.  EEG to evaluate for seizures.  Level of alertness: awake, asleep  AEDs during EEG study: Depakote  Technical aspects: This EEG study was done with scalp electrodes positioned according to the 10-20 International system of electrode placement. Electrical activity was acquired at a sampling rate of 500Hz  and reviewed with a high frequency filter of 70Hz  and a low frequency filter of 1Hz . EEG data were recorded continuously and digitally stored.  Description: During awake state, no clear posterior dominant rhythm was seen.  Sleep was characterized by sleep spindles (12 to 14 Hz), maximal frontocentral region. EEG showed continuous generalized 6 to 9 Hz theta-alpha activity in the right hemisphere as well as 2 to 3 Hz rhythmic delta slowing in left hemisphere which at times appears sharply contoured. Hyperventilation and photic stimulation were not performed.    ABNORMALITY -Continuous slow, generalized and lateralized left hemisphere  IMPRESSION: This study is suggestive of cortical dysfunction in left hemisphere likely secondary to underlying hematoma as well as moderate diffuse encephalopathy, nonspecific etiology. No seizures or definite epileptiform discharges were seen throughout the recording.  Lora Havens   Portable  EEG  Result Date: 11/25/2020 Lora Havens, MD     11/25/2020  4:57 PM  Patient Name: DRAVEN NATTER MRN: [id number] Epilepsy Attending: Lora Havens Referring Physician/Provider: Dr. Duffy Rhody Date: 11/25/2020 Duration: 25.05 mins Patient history: 87 year old male with left subdural hematoma.  EEG to evaluate for seizures. Level of alertness: lethargic AEDs during EEG study: Depakote Technical aspects: This EEG study was done with scalp electrodes positioned according to the 10-20 International system of electrode placement. Electrical activity was acquired at a sampling rate of 500Hz  and reviewed with a high frequency filter of 70Hz  and a low frequency filter of 1Hz . EEG data were recorded continuously and digitally stored. Description: EEG showed continuous generalized 6 to 9 Hz theta-alpha activity in the right hemisphere as well as 2 to 3 Hz rhythmic delta slowing in left hemisphere which at times appears sharply contoured. Hyperventilation and photic stimulation were not performed.   ABNORMALITY -Continuous slow, generalized and lateralized left hemisphere IMPRESSION: This study is suggestive of cortical dysfunction in left hemisphere likely secondary to underlying hematoma as well as moderate diffuse encephalopathy, nonspecific etiology. No seizures or definite epileptiform discharges were seen throughout the recording. Lora Havens    Assessment/Plan: 87 yo M with large chronic SDH s/p drainage - AED and EEG mgmt per neurology - dex taper, though the steroids do not seem to have helped - likely would benefit from [place] 11/27/2020, 1:22 PM

## 2020-11-28 DIAGNOSIS — S065X9A Traumatic subdural hemorrhage with loss of consciousness of unspecified duration, initial encounter: Secondary | ICD-10-CM | POA: Diagnosis not present

## 2020-11-28 DIAGNOSIS — I4891 Unspecified atrial fibrillation: Secondary | ICD-10-CM | POA: Diagnosis not present

## 2020-11-28 LAB — BASIC METABOLIC PANEL
Anion gap: 12 (ref 5–15)
BUN: 43 mg/dL — ABNORMAL HIGH (ref 8–23)
CO2: 20 mmol/L — ABNORMAL LOW (ref 22–32)
Calcium: 8.6 mg/dL — ABNORMAL LOW (ref 8.9–10.3)
Chloride: 106 mmol/L (ref 98–111)
Creatinine, Ser: 1.39 mg/dL — ABNORMAL HIGH (ref 0.61–1.24)
GFR, Estimated: 48 mL/min — ABNORMAL LOW (ref >60)
Glucose, Bld: 127 mg/dL — ABNORMAL HIGH (ref 70–99)
Potassium: 4.5 mmol/L (ref 3.5–5.1)
Sodium: 138 mmol/L (ref 135–145)

## 2020-11-28 LAB — CBC
HCT: 33.4 % — ABNORMAL LOW (ref 39.0–52.0)
Hemoglobin: 11.6 g/dL — ABNORMAL LOW (ref 13.0–17.0)
MCH: 33.1 pg (ref 26.0–34.0)
MCHC: 34.7 g/dL (ref 30.0–36.0)
MCV: 95.4 fL (ref 80.0–100.0)
Platelets: 126 10*3/uL — ABNORMAL LOW (ref 150–400)
RBC: 3.5 MIL/uL — ABNORMAL LOW (ref 4.22–5.81)
RDW: 13.1 % (ref 11.5–15.5)
WBC: 6.9 10*3/uL (ref 4.0–10.5)
nRBC: 0 % (ref 0.0–0.2)

## 2020-11-28 NOTE — Progress Notes (Addendum)
  Speech Language Pathology Treatment: Dysphagia;Cognitive-Linquistic  Patient Details Name: Cesar [name] MRN: [id number] DOB: Jan 01, 1929 Today's Date: 11/28/2020 Time: 2694-8546 SLP Time Calculation (min) (ACUTE ONLY): 14 min  Assessment / Plan / Recommendation Clinical Impression  Report from [place] that pt was frequently coughing during breakfast  and wife came to the desk to report this as well at lunch. SLP educated wife on language abilities last week compared to now after change in status. He responded to questions 20% of the time with significant dysarthria and little labial movement. Opened mouth on command but unable to round lips to mimic blowing out a candle.  No s/s observed with several sips thin liquid but in light of cognitive/speech changes and report of s/s dysphagia with thin will downgrade to nectar thick liquids and plan to do MBS tomorrow. Continue Dys 1. Pt can have sips thin water (only) in between meals and after oral care.   HPI HPI: 88 y.o. male with medical history significant of permanent A. fib, CKD stage III, CVA, seizure disorder, depression, history of DVT, hyperlipidemia, OSA not on CPAP, AAA, ascending thoracic aortic aneurysm, severe aortic stenosis status post TAVR 10/22/2020, nonobstructive CAD presenting to the ED via EMS for evaluation of weakness and multiple falls. Head CT showing acute on chronic left cerebral convexity subdural hematoma. Pt s/p L sided burr hole x2 and placement of subdural drain for evacuation of SDH 11/21/20. On 2/20 pt exhibited aphasia, dysarthria and increased weakness on right. MMRI negative for changes. On continuous EEG.      SLP Plan  Continue with current plan of care       Recommendations  Diet recommendations: Dysphagia 1 (puree);Nectar-thick liquid Liquids provided via: Cup Medication Administration: Whole meds with puree (unless large) Supervision: Staff to assist with self feeding;Full supervision/cueing for  compensatory strategies Compensations: Minimize environmental distractions;Slow rate;Small sips/bites;Lingual sweep for clearance of pocketing Postural Changes and/or Swallow Maneuvers: Seated upright 90 degrees                Oral Care Recommendations: Oral care BID Follow up Recommendations: Skilled Nursing facility SLP Visit Diagnosis: Dysphagia, unspecified (R13.10);Dysarthria and anarthria (R47.1) Plan: Continue with current plan of care       GO                [name] 11/28/2020, 4:34 PM

## 2020-11-28 NOTE — Progress Notes (Signed)
Subjective: NAEs o/n  Objective: Vital signs in last 24 hours: Temp:  [97.9 F (36.6 C)-98.6 F (37 C)] 98.5 F (36.9 C) (02/24 1135) Pulse Rate:  [60-85] 66 (02/24 1135) Resp:  [17-21] 17 (02/24 1135) BP: (113-140)/(64-75) 140/75 (02/24 1135) SpO2:  [96 %-98 %] 98 % (02/24 1135)  Intake/Output from previous day: 02/23 0701 - 02/24 0700 In: 120 [P.O.:120] Out: 475 [Urine:475] Intake/Output this shift: Total I/O In: 360 [P.O.:360] Out: 400 [Urine:400]  Awake, alert, nonverbal, makes sounds. Follows simple commands x 4 RUE proximally 3/5, distal 2/5. RLE 4/5 Incisions c/d  Lab Results: Recent Labs    11/25/20 1232 11/28/20 0314  WBC 6.4 6.9  HGB 12.9* 11.6*  HCT 40.4 33.4*  PLT 132* 126*   BMET Recent Labs    11/25/20 1232 11/28/20 0314  NA 135 138  K 4.4 4.5  CL 99 106  CO2 27 20*  GLUCOSE 127* 127*  BUN 33* 43*  CREATININE 1.54* 1.39*  CALCIUM 8.8* 8.6*    Studies/Results: DG Chest 1 View  Result Date: 11/27/2020 CLINICAL DATA:  Aspiration pneumonia. EXAM: CHEST  1 VIEW COMPARISON:  November 19, 2020. FINDINGS: Stable cardiomegaly. No pneumothorax or pleural effusion is noted. Both lungs are clear. The visualized skeletal structures are unremarkable. IMPRESSION: No active disease. Aortic Atherosclerosis (ICD10-I70.0). Electronically Signed   By: Marijo Conception M.D.   On: 11/27/2020 10:36   Overnight EEG with video  Result Date: 11/27/2020 Cesar Havens, MD     11/27/2020 11:04 AM Patient Name: Cesar [name] MRN: [id number] Epilepsy Attending: Lora [name] Referring Physician/Provider: Dr. Amie [place] Duration: 11/26/2020 7893 to 11/27/2020 8101  Patient history: 87 year old male with left subdural hematoma.  EEG to evaluate for seizures.  Level of alertness: awake, asleep  AEDs during EEG study: Depakote  Technical aspects: This EEG study was done with scalp electrodes positioned according to the 10-20 International system of electrode placement.  Electrical activity was acquired at a sampling rate of 500Hz  and reviewed with a high frequency filter of 70Hz  and a low frequency filter of 1Hz . EEG data were recorded continuously and digitally stored.  Description: During awake state, no clear posterior dominant rhythm was seen.  Sleep was characterized by sleep spindles (12 to 14 Hz), maximal frontocentral region. EEG showed continuous generalized 6 to 9 Hz theta-alpha activity in the right hemisphere as well as 2 to 3 Hz rhythmic delta slowing in left hemisphere which at times appears sharply contoured. Hyperventilation and photic stimulation were not performed.    ABNORMALITY -Continuous slow, generalized and lateralized left hemisphere  IMPRESSION: This study is suggestive of cortical dysfunction in left hemisphere likely secondary to underlying hematoma as well as moderate diffuse encephalopathy, nonspecific etiology. No seizures or definite epileptiform discharges were seen throughout the recording.  Cesar [name]    Assessment/Plan: 87 yo M with CAD, TAVR s/p burr hole drainage of L chronic SDH.  He continues to have focal right sided deficits despite normal EEG and CT head showing improving SDH - will likely need continued therapies - can restart ASA 2 weeks postop - will need f/u CT scan in 6 weeks.  If SDH minimal, would potentially be able to restart oral anticoagulation then with subsequent f/u CT.  Cesar [name] 11/28/2020, 12:21 PM

## 2020-11-28 NOTE — Progress Notes (Signed)
IP rehab admissions - I spoke with wife at the bedside.  Wife is hopeful for CIR; she really does not want SNF for patient.  Awaiting further therapy to see how patient is progressing.  I will have my partner follow up tomorrow.  Call for questions.  [phone]

## 2020-11-28 NOTE — Progress Notes (Signed)
Physical Therapy Treatment Patient Details Name: Cesar [name] MRN: [id number] DOB: Sep 09, 1929 Today's Date: 11/28/2020    History of Present Illness Pt is 87 y.o. male with medical history significant of permanent A. fib on Eliquis, CKD stage III, CVA, seizure disorder, depression, history of DVT, hyperlipidemia, OSA not on CPAP, AAA, ascending thoracic aortic aneurysm followed by Dr. [name], severe aortic stenosis status post TAVR 10/22/2020, nonobstructive CAD presenting to the ED via EMS for evaluation of weakness and multiple falls. Head CT showing acute on chronic left cerebral convexity subdural hematoma. Pt s/p L sided burr hole x2 and placement of subdural drain for evacuation of SDH 11/21/20.  Drain has been removed.  Pt with decline in mental status and R UE weakness 2/21 - MRI was negative for acute changes, Pt with hx of seizures - now on continuous EEG.    PT Comments    Pt tolerates treatment well this session, with improved ability to follow commands initially. PT does note slowed processing as session progresses, possibly due to fatigue. Pt is able to transfer out of bed and perform bed mobility, continuing to require physical assistance due to weakness and impairments in motor planning of R side. Pt will benefit from continued acute PT POC to improve mobility quality and reduce caregiver burden. PT recommends CIR placement at this time.   Follow Up Recommendations  CIR;Supervision for mobility/OOB     Equipment Recommendations  Wheelchair (measurements PT);Wheelchair cushion (measurements PT);Hospital bed (mechanical lift, if home today)    Recommendations for Other Services       Precautions / Restrictions Precautions Precautions: Fall Precaution Comments: pt is inattentive to [place], [place] with hearing aides Restrictions Weight Bearing Restrictions: No Other Position/Activity Restrictions: HOH    Mobility  Bed Mobility Overal bed mobility: Needs Assistance Bed  Mobility: Supine to Sit     Supine to sit: Mod assist     General bed mobility comments: pt requires verbal cues for sequencing along with physical assistance to elevate trunk into sitting and to pivot hips to edge of bed. Difficulty managing R side    Transfers Overall transfer level: Needs assistance Equipment used: 1 person hand held assist Transfers: Sit to/from [facility] Sit to Stand: Mod assist Stand pivot transfers: Max assist       General transfer comment: PT provides R knee block, pt able to power up well but with more difficulty pivoting to recliner.  Ambulation/Gait                 Stairs             Wheelchair Mobility    Modified Rankin (Stroke Patients Only) Modified Rankin (Stroke Patients Only) Pre-Morbid Rankin Score: Moderate disability Modified Rankin: Severe disability     Balance Overall balance assessment: Needs assistance Sitting-balance support: Single extremity supported;No upper extremity supported;Feet supported Sitting balance-Leahy Scale: Poor Sitting balance - Comments: minG-minA, posterior lean Postural control: Posterior lean Standing balance support: Single extremity supported;Bilateral upper extremity supported Standing balance-Leahy Scale: Poor Standing balance comment: modA with UE support of PT                            Cognition Arousal/Alertness: Awake/alert Behavior During Therapy: Flat affect;Impulsive Overall Cognitive Status: Impaired/Different from baseline Area of Impairment: Attention;Following commands;Problem solving;Safety/judgement;Awareness                   Current Attention Level: Sustained  Following Commands: Follows one step commands with increased time Safety/Judgement: Decreased awareness of safety Awareness: Intellectual Problem Solving: Slow processing;Requires tactile cues;Requires verbal cues;Difficulty sequencing        Exercises       General Comments General comments (skin integrity, edema, etc.): VSS on RA      Pertinent Vitals/Pain Pain Assessment: Faces Faces Pain Scale: No hurt    Home Living                      Prior Function            PT Goals (current goals can now be found in the care plan section) Acute Rehab PT Goals Patient Stated Goal: none stated Progress towards PT goals: Progressing toward goals (slowly)    Frequency    Min 4X/week      PT Plan Current plan remains appropriate    Co-evaluation              AM-PAC PT "6 Clicks" Mobility   Outcome Measure  Help needed turning from your back to your side while in a flat bed without using bedrails?: A Little Help needed moving from lying on your back to sitting on the side of a flat bed without using bedrails?: A Lot Help needed moving to and from a bed to a chair (including a wheelchair)?: A Lot Help needed standing up from a chair using your arms (e.g., wheelchair or bedside chair)?: A Lot Help needed to walk in hospital room?: Total Help needed climbing 3-5 steps with a railing? : Total 6 Click Score: 11    End of Session   Activity Tolerance: Patient tolerated treatment well Patient left: in chair;with call bell/phone within reach;with chair alarm set Nurse Communication: Mobility status;Need for lift equipment (STEDY) PT Visit Diagnosis: Unsteadiness on feet (R26.81);Other abnormalities of gait and mobility (R26.89);Muscle weakness (generalized) (M62.81);Other symptoms and signs involving the nervous system (R29.898)     Time: 1638-4665 PT Time Calculation (min) (ACUTE ONLY): 17 min  Charges:  $Therapeutic Activity: 8-22 mins                     Zenaida Niece, PT, DPT Acute Rehabilitation Pager: [phone]    Zenaida Niece 11/28/2020, 5:35 PM

## 2020-11-28 NOTE — Progress Notes (Signed)
PROGRESS NOTE    Cesar [name]   ION:[id number]  DOB: 30-Jul-1929  DOA: 11/19/2020 PCP: Orpah Melter, MD   Brief Narrative:  Cesar [name]  is a 87 y.o. male with medical history significant of permanent A. fib on Eliquis, CKD stage III, CVA, seizure disorder, depression, history of DVT, hyperlipidemia, OSA not on CPAP, AAA, ascending thoracic aortic aneurysm followed by Dr. [name], severe aortic stenosis status post TAVR 10/22/2020, nonobstructive CAD presenting to the ED via EMS for evaluation of weakness and multiple falls.   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  There is associated mass-effect with 5 mm left to right midline shift. The ED physician discussed the case with neurosurgery.  Neurosurgeon recommended to hold Eliquis but not reverse it.  The patient was admitted to the hospital the plan to repeat the CT scan in the morning.  Subjective: More alert today. Not communicating with me.     Assessment & Plan:   Principal Problem:   SDH (subdural hematoma) - s/p burr hole x 2 on 2/17- holding Eliquis  Active Problems:  Decreased level of consciousness - per daughter, this was noted on the AM of 2/21- further work up included brain imaging and not acute findings were noted - EEG > cortical dysfunction in left hemisphere likely secondary to underlying hematoma as well as moderate diffuse encephalopathy, nonspecific etiology - he has undergone a long term EEG- no seizure activity noted - he has had an episode in the past that was suspected to be a  focal seizure and he was started on 500 mg of Depakote QHS which he was taking regularly - his Depakote level was low and is now his dose of Depakote is being increased to reach a therapeutic level - UA not strongly suggestive of UTI and culture shows not growth so far - he is more alert now, has been started on a diet and is participating a little with  therapy- cont to follow for improvement in mental status   CKD (chronic kidney disease), stage III   - baseline ~ 1.2- 1.40 - follow intermittently      Atrial fibrillation, permanent -CHA2DS2-VASc Score at least 3 - Xarelto on hold due to intracranial bleed -- follow on telemetry - not on a rate controlling agent  S/p TAVR - holding Xarelto and ASA in setting of acute bleed  BPH - cont Proscar  Severe hearing loss     Time spent in minutes: 35 DVT prophylaxis: SCDs Code Status: Full code Family Communication: wife Level of Care: Level of care: Progressive Disposition Plan:  Status is: Observation  The patient will require care spanning > 2 midnights and should be moved to inpatient because: Inpatient level of care appropriate due to severity of illness  Dispo: The patient is from: Home              Anticipated d/c is to: TBD              Anticipated d/c date is: > 3 days              Patient currently is not medically stable to d/c.   Difficult to place patient No      Consultants:   NS Procedures:   none Antimicrobials:  Anti-infectives (From admission, onward)   Start     Dose/Rate Route Frequency Ordered Stop   11/25/20 2200  sulfamethoxazole-trimethoprim (BACTRIM) 160 mg  in dextrose 5 % 250 mL IVPB  Status:  Discontinued        160 mg 260 mL/hr over 60 Minutes Intravenous Every 12 hours 11/25/20 2111 11/25/20 2116   11/25/20 1500  sulfamethoxazole-trimethoprim (BACTRIM DS) 800-160 MG per tablet 1 tablet  Status:  Discontinued        1 tablet Oral Every 12 hours 11/25/20 1409 11/25/20 2116   11/21/20 2330  ceFAZolin (ANCEF) IVPB 1 g/50 mL premix        1 g 100 mL/hr over 30 Minutes Intravenous Every 8 hours 11/21/20 1938 11/23/20 1607       Objective: Vitals:   11/27/20 2306 11/28/20 0305 11/28/20 0739 11/28/20 1135  BP: 127/71 133/72 139/64 140/75  Pulse: 67 64 60 66  Resp: (!) 21 (!) 21 20 17   Temp: 98.6 F (37 C) 98.6 F (37 C) 97.9 F  (36.6 C) 98.5 F (36.9 C)  TempSrc: Oral Oral Oral Axillary  SpO2: 96% 97% 98% 98%    Intake/Output Summary (Last 24 hours) at 11/28/2020 1310 Last data filed at 11/28/2020 0915 Gross per 24 hour  Intake 360 ml  Output 875 ml  Net -515 ml   There were no vitals filed for this visit.  Examination: General exam: Appears comfortable  HEENT: PERRLA, oral mucosa moist, no sclera icterus or thrush Respiratory system: Clear to auscultation. Respiratory effort normal. Cardiovascular system: S1 & S2 heard, regular rate and rhythm Gastrointestinal system: Abdomen soft, non-tender, nondistended. Normal bowel sounds   Central nervous system: Alert - No focal neurological deficits. Extremities: No cyanosis, clubbing or edema Skin: No rashes or ulcers      Data Reviewed: I have personally reviewed following labs and imaging studies  CBC: Recent Labs  Lab 11/25/20 1232 11/28/20 0314  WBC 6.4 6.9  NEUTROABS 4.5  --   HGB 12.9* 11.6*  HCT 40.4 33.4*  MCV 98.3 95.4  PLT 132* 536*   Basic Metabolic Panel: Recent Labs  Lab 11/25/20 1232 11/28/20 0314  NA 135 138  K 4.4 4.5  CL 99 106  CO2 27 20*  GLUCOSE 127* 127*  BUN 33* 43*  CREATININE 1.54* 1.39*  CALCIUM 8.8* 8.6*   GFR: Estimated Creatinine Clearance: 40.7 mL/min (A) (by C-G formula based on SCr of 1.39 mg/dL (H)). Liver Function Tests: No results for input(s): AST, ALT, ALKPHOS, BILITOT, PROT, ALBUMIN in the last 168 hours. No results for input(s): LIPASE, AMYLASE in the last 168 hours. No results for input(s): AMMONIA in the last 168 hours. Coagulation Profile: No results for input(s): INR, PROTIME in the last 168 hours. Cardiac Enzymes: No results for input(s): CKTOTAL, CKMB, CKMBINDEX, TROPONINI in the last 168 hours. BNP (last 3 results) No results for input(s): PROBNP in the last 8760 hours. HbA1C: No results for input(s): HGBA1C in the last 72 hours. CBG: Recent Labs  Lab 11/21/20 1948  GLUCAP 123*    Lipid Profile: No results for input(s): CHOL, HDL, LDLCALC, TRIG, CHOLHDL, LDLDIRECT in the last 72 hours. Thyroid Function Tests: No results for input(s): TSH, T4TOTAL, FREET4, T3FREE, THYROIDAB in the last 72 hours. Anemia Panel: No results for input(s): VITAMINB12, FOLATE, FERRITIN, TIBC, IRON, RETICCTPCT in the last 72 hours. Urine analysis:    Component Value Date/Time   COLORURINE YELLOW 11/25/2020 [place] 11/25/2020 1214   LABSPEC 1.017 11/25/2020 1214   PHURINE 6.0 11/25/2020 1214   GLUCOSEU NEGATIVE 11/25/2020 1214   HGBUR NEGATIVE 11/25/2020 1214   BILIRUBINUR NEGATIVE  11/25/2020 [place] 11/25/2020 1214   PROTEINUR NEGATIVE 11/25/2020 1214   UROBILINOGEN 2.0 (H) 01/01/2012 1328   NITRITE NEGATIVE 11/25/2020 1214   LEUKOCYTESUR TRACE (A) 11/25/2020 1214   Sepsis Labs: @LABRCNTIP (procalcitonin:4,lacticidven:4) ) Recent Results (from the past 240 hour(s))  SARS CORONAVIRUS 2 (TAT 6-24 HRS) Nasopharyngeal Nasopharyngeal Swab     Status: None   Collection Time: 11/19/20  8:13 PM   Specimen: Nasopharyngeal Swab  Result Value Ref Range Status   SARS Coronavirus 2 NEGATIVE NEGATIVE Final    Comment: (NOTE) SARS-CoV-2 target nucleic acids are NOT DETECTED.  The SARS-CoV-2 RNA is generally detectable in upper and lower respiratory specimens during the acute phase of infection. Negative results do not preclude SARS-CoV-2 infection, do not rule out co-infections with other pathogens, and should not be used as the sole basis for treatment or other patient management decisions. Negative results must be combined with clinical observations, patient history, and epidemiological information. The expected result is Negative.  Fact Sheet for Patients: SugarRoll.be  Fact Sheet for Healthcare Providers: https://www.woods-mathews.com/  This test is not yet approved or cleared by the [place] FDA  and  has been authorized for detection and/or diagnosis of SARS-CoV-2 by FDA under an Emergency Use Authorization (EUA). This EUA will remain  in effect (meaning this test can be used) for the duration of the COVID-19 declaration under Se ction 564(b)(1) of the Act, 21 U.S.C. section 360bbb-3(b)(1), unless the authorization is terminated or revoked sooner.  Performed at [facility], [place] [address], [place], [place] [address]          Radiology Studies: DG Chest 1 View  Result Date: 11/27/2020 CLINICAL DATA:  Aspiration pneumonia. EXAM: CHEST  1 VIEW COMPARISON:  November 19, 2020. FINDINGS: Stable cardiomegaly. No pneumothorax or pleural effusion is noted. Both lungs are clear. The visualized skeletal structures are unremarkable. IMPRESSION: No active disease. Aortic Atherosclerosis (ICD10-I70.0). Electronically Signed   By: Marijo Conception M.D.   On: 11/27/2020 10:36   Overnight EEG with video  Result Date: 11/27/2020 Lora Havens, MD     11/27/2020 11:04 AM Patient Name: TAMMY ERICSSON MRN: [id number] Epilepsy Attending: Lora Havens Referring Physician/Provider: Dr. Amie Portland Duration: 11/26/2020 3419 to 11/27/2020 6222  Patient history: 87 year old male with left subdural hematoma.  EEG to evaluate for seizures.  Level of alertness: awake, asleep  AEDs during EEG study: Depakote  Technical aspects: This EEG study was done with scalp electrodes positioned according to the 10-20 International system of electrode placement. Electrical activity was acquired at a sampling rate of 500Hz  and reviewed with a high frequency filter of 70Hz  and a low frequency filter of 1Hz . EEG data were recorded continuously and digitally stored.  Description: During awake state, no clear posterior dominant rhythm was seen.  Sleep was characterized by sleep spindles (12 to 14 Hz), maximal frontocentral region. EEG showed continuous generalized 6 to 9 Hz theta-alpha activity in the right  hemisphere as well as 2 to 3 Hz rhythmic delta slowing in left hemisphere which at times appears sharply contoured. Hyperventilation and photic stimulation were not performed.    ABNORMALITY -Continuous slow, generalized and lateralized left hemisphere  IMPRESSION: This study is suggestive of cortical dysfunction in left hemisphere likely secondary to underlying hematoma as well as moderate diffuse encephalopathy, nonspecific etiology. No seizures or definite epileptiform discharges were seen throughout the recording.  Priyanka Barbra Sarks      Scheduled Meds: . chlorhexidine  15 mL Mouth/Throat Once   Or  . mouth rinse  15 mL Mouth Rinse Once  . Chlorhexidine Gluconate Cloth  6 each Topical Daily  . dexamethasone  1 mg Intravenous Q12H   Followed by  . [START ON 11/29/2020] dexamethasone  1 mg Intravenous Q24H  . docusate sodium  100 mg Oral BID  . finasteride  5 mg Oral Daily  . heparin  5,000 Units Subcutaneous Q12H  . pantoprazole  40 mg Oral Q supper   Continuous Infusions: . sodium chloride 10 mL/hr at 11/26/20 1400  . niCARDipine    . valproate sodium 250 mg (11/28/20 1129)     LOS: 8 days      Debbe Odea, MD [facility] Pager: [email or web address] 11/28/2020, 1:10 PM

## 2020-11-28 NOTE — Progress Notes (Addendum)
Neurology progress note  Subjective: No acute overnight events.  Patient evaluated at bedside this morning, no family present in the room.  Patient remains moderately aphasic.  Objective: Current vital signs: BP 139/64 (BP Location: Left Arm)   Pulse 60   Temp 97.9 F (36.6 C) (Oral)   Resp 20   SpO2 98%  Vital signs in last 24 hours: Temp:  [97.9 F (36.6 C)-98.6 F (37 C)] 97.9 F (36.6 C) (02/24 0739) Pulse Rate:  [58-85] 60 (02/24 0739) Resp:  [17-21] 20 (02/24 0739) BP: (113-139)/(61-72) 139/64 (02/24 0739) SpO2:  [96 %-98 %] 98 % (02/24 0739)  General exam HEENT-  Normocephalic, no lesions, without obvious abnormality.  Normal external eye and conjunctiva.   Cardiovascular- S1-S2 audible, pulses palpable throughout   Lungs-no rhonchi or wheezing noted, no excessive working breathing.  Saturations within normal limits Abdomen- All 4 quadrants palpated and nontender Extremities- Warm, dry and intact Musculoskeletal-no joint tenderness, deformity or swelling Skin-warm and dry, no hyperpigmentation, vitiligo, or suspicious lesions  Neurologic Exam: Mental Status: Patient is awake, alert and able to follow simple midline and peripheral verbal commands.  He mumbles incoherently when tries to speak. Cranial Nerves: Pupils equal, round, reactive to light and accommodation.  Able to follow finger in both directions, upwards, downwards as well.  No nystagmus noted.  Blink to threat bilaterally.  Facial asymmetry noted with nasolabial fold flattening on right.  Hearing intact and mid line tongue extension. Motor: 5/5 in left extremities, 4/5 in right lower extremity but difficulty raising right upper extremity, with help able to keep up for 5 seconds but then drift noted along with hitting the bed. Tone and bulk:normal tone throughout; no atrophy noted Sensory: Nodes able to feel light touch in all extremities equally. Deep Tendon Reflexes:  Hypoactive . Cerebellar: Normal finger-to-nose on left upper extremity, unable to perform contralateral extremity. Gait: Deferred  Lab Results: Basic Metabolic Panel: Recent Labs  Lab 11/25/20 1232 11/28/20 0314  NA 135 138  K 4.4 4.5  CL 99 106  CO2 27 20*  GLUCOSE 127* 127*  BUN 33* 43*  CREATININE 1.54* 1.39*  CALCIUM 8.8* 8.6*   CBC: Recent Labs  Lab 11/25/20 1232 11/28/20 0314  WBC 6.4 6.9  NEUTROABS 4.5  --   HGB 12.9* 11.6*  HCT 40.4 33.4*  MCV 98.3 95.4  PLT 132* 126*   Imaging: CT Head wo contrast 11/25/2020 IMPRESSION: 1. Status post drainage of left subdural hematoma. A stable amount of hemorrhage and fluid remains in the left subdural area compared to prior exam. Stable minimal left-to-right midline shift of 4 mm is noted. 2. No new hemorrhage is noted. Diffuse cortical atrophy is noted. Mild chronic ischemic white matter disease is noted.  MR Brain wo contrast MRA head wo contrast 11/26/2020 IMPRESSION: 1. Unchanged thickness of left convexity subdural hematoma. No acute ischemia. 2. Normal intracranial MRA. 3. Old infarcts of the right frontal lobe and left caudate head. 4. Chronic small vessel ischemic changes of the white matter.  LT EEG 11/27/2020 IMPRESSION: This study issuggestive of cortical dysfunctioninleft hemisphere likely secondary to underlying hematoma as well as moderate diffuse encephalopathy, nonspecific etiology. No seizures ordefiniteepileptiform discharges were seen throughout the recording.  Assessment:  87 year old man with past medical history significant for cerebrovascular risk factors with recent evacuation of chronic subdural hematoma and off of anticoagulation due to the hematoma, with worsening right-sided weakness and aphasia.   Presumably he was able to talk better postsurgically but has worsened  since. I presume part of this is the cortical dysfunction from the subdural hematoma on the left cerebral convexity  but we are looking at the long-term EEG to rule out underlying electrographic seizures which might be contributing to this.  Also questionable UTI which might be contributing.  LTM EEG neg for seizures/status  Impression: -Left-sided subdural hematoma s/p drainage -H/O atrial fibrillation, of off anticoagulation due to subdural hematoma.  Recommendation: -Continue Depakote 250 IV q6 - check level in a few days  -Follow PT OT assessment for rehab -Anticipate slow improvement given large size of hematoma and advanced age.  Honor Junes, MD PGY-1, Resident  11/28/2020, 10:25 AM    Attending Neurohospitalist Addendum Patient seen and examined with APP/Resident. Agree with the history and physical as documented above. Agree with the plan as documented, which I helped formulate. I have independently reviewed the chart, obtained history, review of systems and examined the patient.I have personally reviewed pertinent head/neck/spine imaging (CT/MRI). Please feel free to call with any questions. --- Amie Portland, MD [facility] Pager: [phone]  If 7pm to 7am, please call on call as listed on AMION.

## 2020-11-29 ENCOUNTER — Inpatient Hospital Stay (HOSPITAL_COMMUNITY): Payer: Medicare Other

## 2020-11-29 DIAGNOSIS — I4891 Unspecified atrial fibrillation: Secondary | ICD-10-CM | POA: Diagnosis not present

## 2020-11-29 MED ORDER — DIVALPROEX SODIUM 250 MG PO DR TAB
250.0000 mg | DELAYED_RELEASE_TABLET | Freq: Four times a day (QID) | ORAL | Status: DC
  Administered 2020-11-29 – 2020-12-02 (×12): 250 mg via ORAL
  Filled 2020-11-29 (×13): qty 1

## 2020-11-29 NOTE — Progress Notes (Signed)
PROGRESS NOTE    Cesar [name]  QDI:[id number] DOB: 07-06-1929 DOA: 11/19/2020 PCP: Orpah Melter, MD   Chief Complaint  Patient presents with  . Weakness   Brief Narrative: 87 year old male with multiple complex comorbidities permanent A. fib on Eliquis, CKD stage III, CVA, seizure disorder, depression, hyperlipidemia, OSA not on CPAP, AAA/ascending thoracic aortic aneurysm followed by Dr. [name], severe aortic stenosis status post TAVR 10/22/20, nonobstructive CAD admitted for multiple falls and weakness and CT head showed acute on chronic left subdural convexity subdural hematoma up to 3 cm Neurosurgery was consulted Eliquis was held and patient was admitted on 2/15 by [facility] Patient underwent burr hole x2 on 2/17. Patient with prolonged hospitalization has had decreased level of consciousness on 2/21 brain imaging EEG were done no acute finding on imaging in no seizure EEG underwent long-term EEG as well. Has had similar episode in the past suspected to be focal seizure, already started on 500 mg Depakote nightly which he was taking regularly.  No evidence of UTI on UA.   Subjective: Seen examined  alert,awake, on bedside chair wife at bedside she says he was so much better this am, moving all his legs, ate well. He did not communicate with me but followed commands, moved his legs.   Assessment & Plan:  SDH: s/p burr hole x2 on 2/17.  Eliquis remains on hold currently under neurosurgery service.  Continue PT OT  Decreased level of consciousness unclear etiology:noted to have level of consciousness on 2/21- s/p brain imaging EEG-no acute finding on imaging in no seizure EEG underwent long-term EEG as well. Has had similar episode in the past suspected to be focal seizure, already started on 500 mg Depakote nightly which he was taking regularly-but level was low dose increased.No evidence of UTI on UA, culture no growth so far.  Mentation has been improving and being more alert awake.  Barium swall SLP eval. And on dysphagia 1 diet.  CKD stage IIIa w/ [place] metabolic acidosis, bicarb 20:: BUN/creatinine trend past year as below 1.3-1.6. Recent Labs    10/22/20 0802 10/22/20 0937 10/22/20 1017 10/23/20 0009 10/23/20 0150 10/30/20 1520 11/19/20 1751 11/19/20 1818 11/25/20 1232 11/28/20 0314  BUN 30* 31* 29* 34* 37* 28* 20 24* 33* 43*  CREATININE 1.30* 1.40* 1.30* 1.66* 1.83* 1.28* 1.34* 1.40* 1.54* 1.39*   A. fib permanent/history of CVA with right-sided weakness: DOAC held due to SDH.  Aspirin also on hold.  Defer to neurosurgery when to resume.  Severe AS s/p TAVR: Holding DAOC/ASA as above.  BPH on Proscar  Severe hearing loss, supportive measures  Generalized weakness/debility/deconditioning continue PT OT will need CIR placement  History of DVT-now on prophylactic subcu heparin  OSA not on CPAP  Nutrition: Diet Order            DIET - DYS 1 Room service appropriate? No; Fluid consistency: Thin  Diet effective now                 DVT prophylaxis: heparin injection 5,000 Units Start: 11/26/20 0800 SCDs Start: 11/21/20 1939 Code Status:   Code Status: Full Code  Family Communication: plan of care discussed with patient and his wife at bedside.  Status is: Inpatient Remains inpatient appropriate because:Inpatient level of care appropriate due to severity of illness  Dispo: The patient is from: Home              Anticipated d/c is to: CIR  Patient currently is not medically stable to d/c.   Difficult to place patient No  Consultants:see note  Procedures:see note  Unresulted Labs (From admission, onward)          Start     Ordered   11/25/20 1403  Urine Culture  Add-on,   AD        11/25/20 1409          Culture/Microbiology No results found for: SDES, SPECREQUEST, CULT, REPTSTATUS  Other culture-see note  Medications: Scheduled Meds: . chlorhexidine  15 mL Mouth/Throat Once   Or  . mouth rinse  15 mL Mouth Rinse  Once  . Chlorhexidine Gluconate Cloth  6 each Topical Daily  . dexamethasone  1 mg Intravenous Q24H  . divalproex  250 mg Oral Q6H  . docusate sodium  100 mg Oral BID  . finasteride  5 mg Oral Daily  . heparin  5,000 Units Subcutaneous Q12H  . pantoprazole  40 mg Oral Q supper   Continuous Infusions: . sodium chloride 10 mL/hr at 11/26/20 1400  . niCARDipine      Antimicrobials: Anti-infectives (From admission, onward)   Start     Dose/Rate Route Frequency Ordered Stop   11/25/20 2200  sulfamethoxazole-trimethoprim (BACTRIM) 160 mg in dextrose 5 % 250 mL IVPB  Status:  Discontinued        160 mg 260 mL/hr over 60 Minutes Intravenous Every 12 hours 11/25/20 2111 11/25/20 2116   11/25/20 1500  sulfamethoxazole-trimethoprim (BACTRIM DS) 800-160 MG per tablet 1 tablet  Status:  Discontinued        1 tablet Oral Every 12 hours 11/25/20 1409 11/25/20 2116   11/21/20 2330  ceFAZolin (ANCEF) IVPB 1 g/50 mL premix        1 g 100 mL/hr over 30 Minutes Intravenous Every 8 hours 11/21/20 1938 11/23/20 1607     Objective: Vitals: Today's Vitals   11/28/20 2332 11/29/20 0303 11/29/20 0749 11/29/20 1124  BP: (!) 131/57 121/60 (!) 149/66 127/66  Pulse: 76 (!) 56 64 73  Resp: (!) _0 Temp: 98.5 F (36.9 C) 98.6 F (37 C) 97.9 F (36.6 C) (!) 97.5 F (36.4 C)  TempSrc: Oral Oral Axillary Oral  SpO2:   97% 100%  PainSc:        Intake/Output Summary (Last 24 hours) at 11/29/2020 1414 Last data filed at 11/29/2020 1230 Gross per 24 hour  Intake 480 ml  Output 1350 ml  Net -870 ml   There were no vitals filed for this visit. Weight change:   Intake/Output from previous day: 02/24 0701 - 02/25 0700 In: 600 [P.O.:600] Out: 1350 [Urine:950; Stool:400] Intake/Output this shift: Total I/O In: 480 [P.O.:480] Out: 400 [Urine:400] There were no vitals filed for this visit.  Examination: General exam: elderly, frail  alert,awake, follows commands,NAD, weak  appearing. HEENT:Oral mucosa moist, Ear/Nose WNL grossly,dentition normal. Respiratory system: bilaterally clear,no wheezing or crackles,no use of accessory muscle, non tender. Cardiovascular system: S1 & S2 +, regular, No JVD. Gastrointestinal system: Abdomen soft, NT,ND, BS+. Nervous System:Alert, awake, moving all his  Extremities Extremities: No edema, distal peripheral pulses palpable.  Skin: No rashes,no icterus. MSK: Normal muscle bulk,tone, power Scalp with staples+  Data Reviewed: I have personally reviewed following labs and imaging studies CBC: Recent Labs  Lab 11/25/20 1232 11/28/20 0314  WBC 6.4 6.9  NEUTROABS 4.5  --   HGB 12.9* 11.6*  HCT 40.4 33.4*  MCV 98.3 95.4  PLT 132* 126*  Basic Metabolic Panel: Recent Labs  Lab 11/25/20 1232 11/28/20 0314  NA 135 138  K 4.4 4.5  CL 99 106  CO2 27 20*  GLUCOSE 127* 127*  BUN 33* 43*  CREATININE 1.54* 1.39*  CALCIUM 8.8* 8.6*   GFR: CrCl cannot be calculated (Unknown ideal weight.). Liver Function Tests: No results for input(s): AST, ALT, ALKPHOS, BILITOT, PROT, ALBUMIN in the last 168 hours. No results for input(s): LIPASE, AMYLASE in the last 168 hours. No results for input(s): AMMONIA in the last 168 hours. Coagulation Profile: No results for input(s): INR, PROTIME in the last 168 hours. Cardiac Enzymes: No results for input(s): CKTOTAL, CKMB, CKMBINDEX, TROPONINI in the last 168 hours. BNP (last 3 results) No results for input(s): PROBNP in the last 8760 hours. HbA1C: No results for input(s): HGBA1C in the last 72 hours. CBG: No results for input(s): GLUCAP in the last 168 hours. Lipid Profile: No results for input(s): CHOL, HDL, LDLCALC, TRIG, CHOLHDL, LDLDIRECT in the last 72 hours. Thyroid Function Tests: No results for input(s): TSH, T4TOTAL, FREET4, T3FREE, THYROIDAB in the last 72 hours. Anemia Panel: No results for input(s): VITAMINB12, FOLATE, FERRITIN, TIBC, IRON, RETICCTPCT in the last 72  hours. Sepsis Labs: No results for input(s): PROCALCITON, LATICACIDVEN in the last 168 hours.  Recent Results (from the past 240 hour(s))  SARS CORONAVIRUS 2 (TAT 6-24 HRS) Nasopharyngeal Nasopharyngeal Swab     Status: None   Collection Time: 11/19/20  8:13 PM   Specimen: Nasopharyngeal Swab  Result Value Ref Range Status   SARS Coronavirus 2 NEGATIVE NEGATIVE Final    Comment: (NOTE) SARS-CoV-2 target nucleic acids are NOT DETECTED.  The SARS-CoV-2 RNA is generally detectable in upper and lower respiratory specimens during the acute phase of infection. Negative results do not preclude SARS-CoV-2 infection, do not rule out co-infections with other pathogens, and should not be used as the sole basis for treatment or other patient management decisions. Negative results must be combined with clinical observations, patient history, and epidemiological information. The expected result is Negative.  Fact Sheet for Patients: SugarRoll.be  Fact Sheet for Healthcare Providers: https://www.woods-mathews.com/  This test is not yet approved or cleared by the [place] FDA and  has been authorized for detection and/or diagnosis of SARS-CoV-2 by FDA under an Emergency Use Authorization (EUA). This EUA will remain  in effect (meaning this test can be used) for the duration of the COVID-19 declaration under Se ction 564(b)(1) of the Act, 21 U.S.C. section 360bbb-3(b)(1), unless the authorization is terminated or revoked sooner.  Performed at [facility], [place] [address], [place], [place] [address]      Radiology Studies: DG Swallowing Func-Speech Pathology  Result Date: 11/29/2020 Objective Swallowing Evaluation: Type of Study: MBS-Modified Barium Swallow Study  Patient Details Name: JAMORI BIGGAR MRN: [id number] Date of Birth: 26-Oct-1928 Today's Date: 11/29/2020 Time: SLP Start Time (ACUTE ONLY): 0940 -SLP Stop Time (ACUTE ONLY): 0958 SLP  Time Calculation (min) (ACUTE ONLY): 18 min Past Medical History: Past Medical History: Diagnosis Date . AAA (abdominal aortic aneurysm) ([place])  . CKD (chronic kidney disease), stage III (HCC)   baseline Cr 1.4-1.5 in 2016-2017 by PCP) . CVA (cerebral infarction)  . Depression  . DVT (deep venous thrombosis) ([place]) 2006 . H/O blood clots  . Hematuria  . HOH (hard of hearing)  . Hypercholesterolemia  . OSA (obstructive sleep apnea)   intol to CPAP . Permanent atrial fibrillation ([place])  . Prostate enlargement  . Pulmonary  nodules  . S/P TAVR (transcatheter aortic valve replacement) 10/22/2020  s/p TAVR with a 29 mm Edwards S3U via the TF approch by Dr. Buena Irish and Dr. Roxy Manns . Thoracic ascending aortic aneurysm ([place])   4.5cm in 11/2018, stable since 2016 . TIA (transient ischemic attack)  Past Surgical History: Past Surgical History: Procedure Laterality Date . BURR HOLE Left 11/21/2020  Procedure: LEFT Side BURR HOLES,;  Surgeon: [name], MD;  Location: [place];  Service: Neurosurgery;  Laterality: Left; . EMBOLECTOMY Left 07/02/2016  Procedure: EMBOLECTOMY LEFT FEMORAL ARTERY;  Surgeon: [name], MD;  Location: [place];  Service: Vascular;  Laterality: Left; Marland Kitchen MESENTERIC ARTERY BYPASS N/A 08/06/2019  Procedure: SUPERIOR MESENTERIC ARTERY THROMBECTOMY;  Surgeon: [name], MD;  Location: [place];  Service: Vascular;  Laterality: N/A; . NM [place]  08/24/2002  markedly positive,subtle lateral ischemia . PATCH ANGIOPLASTY  08/06/2019  Procedure: Patch Angioplasty of superior messenteric artery with RIGHT femoral vein;  Surgeon: [name], MD;  Location: [place];  Service: Vascular;; . PROSTATE SURGERY   . RIGHT HEART CATH AND CORONARY ANGIOGRAPHY N/A 10/21/2020  Procedure: RIGHT HEART CATH AND CORONARY ANGIOGRAPHY;  Surgeon: [name], MD;  Location: [place] [facility];  Service: Cardiovascular;  Laterality: N/A; . TEE WITHOUT CARDIOVERSION N/A 10/22/2020  Procedure:  TRANSESOPHAGEAL ECHOCARDIOGRAM (TEE);  Surgeon: [name], MD;  Location: [place];  Service: Open Heart Surgery;  Laterality: N/A; . TRANSCATHETER AORTIC VALVE REPLACEMENT, TRANSFEMORAL Right 10/22/2020  Procedure: TRANSCATHETER AORTIC VALVE REPLACEMENT, TRANSFEMORAL;  Surgeon: [name], MD;  Location: [place];  Service: Open Heart Surgery;  Laterality: Right; . US ECHOCARDIOGRAPHY  01/06/2012  mod. AOV ca+,mild to mod. AS,mod mostly posterior MAC-mild MR HPI: 87 y.o. male with medical history significant of permanent A. fib, CKD stage III, CVA, seizure disorder, depression, history of DVT, hyperlipidemia, OSA not on CPAP, AAA, ascending thoracic aortic aneurysm, severe aortic stenosis status post TAVR 10/22/2020, nonobstructive CAD presenting to the ED via EMS for evaluation of weakness and multiple falls. Head CT showing acute on chronic left cerebral convexity subdural hematoma. Pt s/p L sided burr hole x2 and placement of subdural drain for evacuation of SDH 11/21/20. On 2/20 pt exhibited aphasia, dysarthria and increased weakness on right. MMRI negative for changes. On continuous EEG.  Subjective: alert, daughter at bedside; now aphasic Assessment / Plan / Recommendation CHL IP CLINICAL IMPRESSIONS 11/29/2020 Clinical Impression Pt demonstrated mild oropharyngeal dysphagia without penetration or aspiration. Lower cervival osteophytes not interferring with pharyngeal function. Although he wears upper dentures, wife reports they are ill fitting since admission. Oral phase marked by anterior spill on right and mild buccal cavity pocketing. From pharyngeal phase his laryngeal closure and tracheal coverage via epiglottis was adequate. Timing occasionally delayed with barium sitting in vallecuale for 4 seconds prior to initiation of movement. Pharyngeal integrity challenged with sequential, large straw sips thin barium x 2.  Vallecular and pyriform sinus residue was trace-min. Transit of pill in  puree was cohesive and pill stopped distal esophagus with barium building slightly on top. Additional trial puree advanced pill into stomach. Wife stated pt has not reported esophageal challenges in the past. Recommend continue Dys 1, upgrade to thin liquids, cup sips preferred, remain upright after meals 30 min. As therapist documenting, wife reported pt's upper denture plate donned and was fitting. Continue ST. SLP Visit Diagnosis Dysphagia, oropharyngeal phase (R13.12) Attention and concentration deficit following -- Frontal lobe and executive function deficit  following -- Impact on safety and function Mild aspiration risk   CHL IP TREATMENT RECOMMENDATION 11/29/2020 Treatment Recommendations Therapy as outlined in treatment plan below   Prognosis 11/29/2020 Prognosis for Safe Diet Advancement Good Barriers to Reach Goals Cognitive deficits Barriers/Prognosis Comment -- CHL IP DIET RECOMMENDATION 11/29/2020 SLP Diet Recommendations Dysphagia 1 (Puree) solids;Thin liquid Liquid Administration via Cup Medication Administration Whole meds with puree Compensations Minimize environmental distractions;Slow rate;Small sips/bites;Lingual sweep for clearance of pocketing Postural Changes Seated upright at 90 degrees;Remain semi-upright after after feeds/meals (Comment)   CHL IP OTHER RECOMMENDATIONS 11/29/2020 Recommended Consults -- Oral Care Recommendations Oral care BID Other Recommendations --   CHL IP FOLLOW UP RECOMMENDATIONS 11/29/2020 Follow up Recommendations Skilled Nursing facility   [facility] IP FREQUENCY AND DURATION 11/29/2020 Speech Therapy Frequency (ACUTE ONLY) min 2x/week Treatment Duration 2 weeks      CHL IP ORAL PHASE 11/29/2020 Oral Phase Impaired Oral - Pudding Teaspoon -- Oral - Pudding Cup -- Oral - Honey Teaspoon -- Oral - Honey Cup -- Oral - Nectar Teaspoon -- Oral - Nectar Cup Right pocketing in lateral sulci Oral - Nectar Straw -- Oral - Thin Teaspoon -- Oral - Thin Cup Right anterior bolus loss Oral -  Thin Straw WFL Oral - Puree WFL Oral - Mech Soft -- Oral - Regular -- Oral - Multi-Consistency -- Oral - Pill WFL Oral Phase - Comment --  CHL IP PHARYNGEAL PHASE 11/29/2020 Pharyngeal Phase Impaired Pharyngeal- Pudding Teaspoon -- Pharyngeal -- Pharyngeal- Pudding Cup -- Pharyngeal -- Pharyngeal- Honey Teaspoon -- Pharyngeal -- Pharyngeal- Honey Cup -- Pharyngeal -- Pharyngeal- Nectar Teaspoon -- Pharyngeal -- Pharyngeal- Nectar Cup Pharyngeal residue - valleculae Pharyngeal -- Pharyngeal- Nectar Straw Delayed swallow initiation-vallecula;Pharyngeal residue - valleculae Pharyngeal -- Pharyngeal- Thin Teaspoon -- Pharyngeal -- Pharyngeal- Thin Cup Pharyngeal residue - valleculae;Delayed swallow initiation-vallecula;Pharyngeal residue - pyriform Pharyngeal -- Pharyngeal- Thin Straw Pharyngeal residue - pyriform Pharyngeal -- Pharyngeal- Puree WFL Pharyngeal -- Pharyngeal- Mechanical Soft -- Pharyngeal -- Pharyngeal- Regular -- Pharyngeal -- Pharyngeal- Multi-consistency -- Pharyngeal -- Pharyngeal- Pill WFL Pharyngeal -- Pharyngeal Comment --  CHL IP CERVICAL ESOPHAGEAL PHASE 11/29/2020 Cervical Esophageal Phase (No Data) Pudding Teaspoon -- Pudding Cup -- Honey Teaspoon -- Honey Cup -- Nectar Teaspoon -- Nectar Cup -- Nectar Straw -- Thin Teaspoon -- Thin Cup -- Thin Straw -- Puree -- Mechanical Soft -- Regular -- Multi-consistency -- Pill -- Cervical Esophageal Comment -- [name] 11/29/2020, 11:32 AM  [name].Ed Actor Pager [phone] Office [phone]               LOS: 9 days   Antonieta Pert, MD [facility]  11/29/2020, 2:14 PM

## 2020-11-29 NOTE — Progress Notes (Signed)
Orthopedic Tech Progress Note Patient Details:  Cesar [name] June 21, 1929 [id number] Called in order to [employer] for a RESTING HAND SPLINT Patient ID: Cesar [name], male   DOB: 21-Jul-1929, 87 y.o.   MRN: [id number]   Janit Pagan 11/29/2020, 2:06 PM

## 2020-11-29 NOTE — Progress Notes (Signed)
Inpatient Rehab Admissions Coordinator:  Saw pt and wife at bedside. Informed them that there are no beds available in CIR today.  Will continue to follow.   Gayland Curry, [place], [place] Admissions Coordinator [phone]

## 2020-11-29 NOTE — Progress Notes (Signed)
Occupational Therapy Treatment Patient Details Name: Cesar [name] MRN: [id number] DOB: 03-21-1929 Today's Date: 11/29/2020    History of present illness Pt is 87 y.o. male with medical history significant of permanent A. fib on Eliquis, CKD stage III, CVA, seizure disorder, depression, history of DVT, hyperlipidemia, OSA not on CPAP, AAA, ascending thoracic aortic aneurysm followed by Dr. [name], severe aortic stenosis status post TAVR 10/22/2020, nonobstructive CAD presenting to the ED via EMS for evaluation of weakness and multiple falls. Head CT showing acute on chronic left cerebral convexity subdural hematoma. Pt s/p L sided burr hole x2 and placement of subdural drain for evacuation of SDH 11/21/20.  Drain has been removed.  Pt with decline in mental status and R UE weakness 2/21 - MRI was negative for acute changes, Pt with hx of seizures - now on continuous EEG.   OT comments  Pt motivated by self feeding for R UE engagement during session. Pt demonstrates R wrist drop and no digit activation. Recommendation for resting hand splint at this time 4 hours on and 4 hours off to match RN rounding. Pt with R inattention but does try to turn to wife speaking. Pt following simple commands. Pt using L hand to wipe R side of mouth showing awareness to poor lip closure. Pt does cough once during feeding with thin liquids. Pt otherwise tolerating meal well. Recommendation for dentures to be removed nightly due to now with PO intake.    Follow Up Recommendations  CIR;Supervision/Assistance - 24 hour    Equipment Recommendations  3 in 1 bedside commode;Wheelchair (measurements OT);Wheelchair cushion (measurements OT);Hospital bed    Recommendations for Other Services Rehab consult    Precautions / Restrictions Precautions Precautions: Fall Precaution Comments: pt is inattentive to RUE, [place] with hearing aides Restrictions Weight Bearing Restrictions: No Other Position/Activity Restrictions: HOH,  does have hearing aides       Mobility Bed Mobility               General bed mobility comments: oob to chair    Transfers Overall transfer level: Needs assistance Equipment used: Rolling walker (2 wheeled) Transfers: Sit to/from [facility] Sit to Stand: Mod assist Stand pivot transfers: Mod assist       General transfer comment: pt requires verbal and visual cues for hand placement. At baseline spouse holds RW and pt pulls up with walker, may be difficult habit to break with new onset neuro deficits    Balance Overall balance assessment: Needs assistance Sitting-balance support: No upper extremity supported Sitting balance-Leahy Scale: Fair     Standing balance support: Bilateral upper extremity supported Standing balance-Leahy Scale: Poor Standing balance comment: reliant on BUE support of RW, minA                           ADL either performed or assessed with clinical judgement   ADL Overall ADL's : Needs assistance/impaired Eating/Feeding: Maximal assistance;Sitting Eating/Feeding Details (indicate cue type and reason): hand over hand without activation of wrist or hand noted. Pt does activate shoulder flexion extension adduction abduction, elbow flexion/ extension. pt initiates task with food presented. pt eating 100 % of tray Grooming: Wash/dry face;Brushing hair;Maximal assistance;Sitting Grooming Details (indicate cue type and reason): pt has less initiation of grooming hair due to reaching outside visual attention  General ADL Comments: pt just transfered to chair with stedy from RN staff. pt positioned in chair and focused on R visual field and R UE     Vision       Perception     Praxis      Cognition Arousal/Alertness: Awake/alert Behavior During Therapy: Impulsive Overall Cognitive Status: Impaired/Different from baseline Area of Impairment: Attention;Following  commands;Safety/judgement;Awareness;Problem solving                   Current Attention Level: Sustained Memory: Decreased recall of precautions Following Commands: Follows one step commands consistently Safety/Judgement: Decreased awareness of safety;Decreased awareness of deficits Awareness: Intellectual Problem Solving: Slow processing;Difficulty sequencing;Requires verbal cues;Requires tactile cues General Comments: no verbal responses, attending some to the R due to wife being on R side. pt needs cues for pocketing. pt reaching to wipe mouth with R side food coming out but just wipes it with hand.        Exercises Other Exercises Other Exercises: works on adls task and self feeding for R UE activation Other Exercises: shoulder flexion x10 , elbow flexion/extension x10, PROM wrist and digits   Shoulder Instructions       General Comments pt tachy up to 140 with mobility    Pertinent Vitals/ Pain       Pain Assessment: Faces Faces Pain Scale: No hurt  Home Living                                          Prior Functioning/Environment              Frequency  Min 2X/week        Progress Toward Goals  OT Goals(current goals can now be found in the care plan section)  Progress towards OT goals: Progressing toward goals  Acute Rehab OT Goals Patient Stated Goal: to improve ambulation OT Goal Formulation: With patient/family Time For Goal Achievement: 12/06/20 Potential to Achieve Goals: Good ADL Goals Pt Will Perform Grooming: with adaptive equipment;with set-up;sitting Pt Will Perform Upper Body Dressing: with set-up;sitting;with adaptive equipment Pt Will Perform Lower Body Dressing: with min guard assist;sit to/from stand;with adaptive equipment Pt Will Transfer to Toilet: with min assist;ambulating;bedside commode Pt Will Perform Toileting - Clothing Manipulation and hygiene: with min assist;sit to/from stand Additional ADL Goal #1:  pt will complete fine motor exercise with handout min (A) as precursor to adls  Plan Discharge plan remains appropriate    Co-evaluation                 AM-PAC OT "6 Clicks" Daily Activity     Outcome Measure   Help from another person eating meals?: A Lot Help from another person taking care of personal grooming?: A Lot Help from another person toileting, which includes using toliet, bedpan, or urinal?: A Lot Help from another person bathing (including washing, rinsing, drying)?: A Lot Help from another person to put on and taking off regular upper body clothing?: A Lot Help from another person to put on and taking off regular lower body clothing?: A Lot 6 Click Score: 12    End of Session    OT Visit Diagnosis: Muscle weakness (generalized) (M62.81)   Activity Tolerance Patient tolerated treatment well   Patient Left in chair;with call bell/phone within reach;with chair alarm set;with family/visitor present   Nurse Communication Mobility status;Precautions  Time: 1761-6073 OT Time Calculation (min): 32 min  Charges: OT General Charges $OT Visit: 1 Visit OT Treatments $Self Care/Home Management : 8-22 mins $Neuromuscular Re-education: 8-22 mins   Brynn, OTR/L  Acute Rehabilitation Services Pager: [phone] Office: [phone] .    Jeri Modena 11/29/2020, 2:05 PM

## 2020-11-29 NOTE — Progress Notes (Signed)
Subjective: NAEs o/n.  Had swallow study this morning, results pending  Objective: Vital signs in last 24 hours: Temp:  [97.9 F (36.6 C)-98.7 F (37.1 C)] 97.9 F (36.6 C) (02/25 0749) Pulse Rate:  [56-78] 64 (02/25 0749) Resp:  [15-23] 15 (02/25 0749) BP: (121-149)/(57-75) 149/66 (02/25 0749) SpO2:  [97 %-98 %] 97 % (02/25 0749)  Intake/Output from previous day: 02/24 0701 - 02/25 0700 In: 600 [P.O.:600] Out: 1350 [Urine:950; Stool:400] Intake/Output this shift: Total I/O In: 240 [P.O.:240] Out: 400 [Urine:400]  Awake, alert, nonverbal. Makes sounds.  FC x 4 RUE prox 3/5, distal 2/5 RLE 4/5 Stapled incisions c/d  Lab Results: Recent Labs    11/28/20 0314  WBC 6.9  HGB 11.6*  HCT 33.4*  PLT 126*   BMET Recent Labs    11/28/20 0314  NA 138  K 4.5  CL 106  CO2 20*  GLUCOSE 127*  BUN 43*  CREATININE 1.39*  CALCIUM 8.6*    Studies/Results: Overnight EEG with video  Result Date: 11/27/2020 Lora Havens, MD     11/27/2020 11:04 AM Patient Name: Cesar [name] MRN: [id number] Epilepsy Attending: Lora Havens Referring Physician/Provider: Dr. Amie [place] Duration: 11/26/2020 5885 to 11/27/2020 0277  Patient history: 87 year old male with left subdural hematoma.  EEG to evaluate for seizures.  Level of alertness: awake, asleep  AEDs during EEG study: Depakote  Technical aspects: This EEG study was done with scalp electrodes positioned according to the 10-20 International system of electrode placement. Electrical activity was acquired at a sampling rate of 500Hz  and reviewed with a high frequency filter of 70Hz  and a low frequency filter of 1Hz . EEG data were recorded continuously and digitally stored.  Description: During awake state, no clear posterior dominant rhythm was seen.  Sleep was characterized by sleep spindles (12 to 14 Hz), maximal frontocentral region. EEG showed continuous generalized 6 to 9 Hz theta-alpha activity in the right hemisphere as  well as 2 to 3 Hz rhythmic delta slowing in left hemisphere which at times appears sharply contoured. Hyperventilation and photic stimulation were not performed.    ABNORMALITY -Continuous slow, generalized and lateralized left hemisphere  IMPRESSION: This study is suggestive of cortical dysfunction in left hemisphere likely secondary to underlying hematoma as well as moderate diffuse encephalopathy, nonspecific etiology. No seizures or definite epileptiform discharges were seen throughout the recording.  Priyanka Barbra Sarks    Assessment/Plan: S/p drainage of chronic L SDH - awaiting CIR - can restart ASA in 1 week - possible anticoagulation ~4-6 weeks after surgery   [name] 11/29/2020, 10:19 AM

## 2020-11-29 NOTE — Progress Notes (Addendum)
  Speech Language Pathology Treatment: Dysphagia  Patient Details Name: Cesar [name] MRN: [id number] DOB: 1929-05-15 Today's Date: 11/29/2020 Time: 4174-0814 SLP Time Calculation (min) (ACUTE ONLY): 15 min  Assessment / Plan / Recommendation Clinical Impression  Therapist on unit and overheard RN tech reporting a suspected aspiration event with water after returning from [facility]. He was upright in chair and apparently was gulping a lage amount of water, cough was weak, face reddened and sats dropped to 60's per tech. There was a suspicion of esophageal component (dysmotility?) and pt may have refluxed then had penetration/aspiration episode with larger amount consumed. Pt was challenged during MBS with multiple sips however likely increased volume in room with water. Educated wife/tech to only allow 3-4 sips before asking pt to stop or removing cup from oral cavity. Cup sips may decrease risk.  Pt answered 2-3 biographical questions with dysfluency and dysarthria- if context unknown, responses were unintelligible. Will continue to diagnostically treat for language abilities.   HPI HPI: 87 y.o. male with medical history significant of permanent A. fib, CKD stage III, CVA, seizure disorder, depression, history of DVT, hyperlipidemia, OSA not on CPAP, AAA, ascending thoracic aortic aneurysm, severe aortic stenosis status post TAVR 10/22/2020, nonobstructive CAD presenting to the ED via EMS for evaluation of weakness and multiple falls. Head CT showing acute on chronic left cerebral convexity subdural hematoma. Pt s/p L sided burr hole x2 and placement of subdural drain for evacuation of SDH 11/21/20. On 2/20 pt exhibited aphasia, dysarthria and increased weakness on right. MMRI negative for changes. On continuous EEG.      SLP Plan  Continue with current plan of care       Recommendations  Diet recommendations: Thin liquid;Dysphagia 1 (puree) Liquids provided via: Cup Medication Administration:  Whole meds with puree Supervision: Staff to assist with self feeding;Full supervision/cueing for compensatory strategies Compensations: Minimize environmental distractions;Slow rate;Small sips/bites;Lingual sweep for clearance of pocketing Postural Changes and/or Swallow Maneuvers: Seated upright 90 degrees                Oral Care Recommendations: Oral care BID Follow up Recommendations: Skilled Nursing facility SLP Visit Diagnosis: Dysphagia, oropharyngeal phase (R13.12);Cognitive communication deficit (R41.841);Dysarthria and anarthria (R47.1) Plan: Continue with current plan of care       GO                Cesar [name] 11/29/2020, 4:41 PM Cesar [name] Cesar [name] Cesar [name].Ed Risk analyst [phone] Office [phone]

## 2020-11-29 NOTE — Progress Notes (Signed)
OT Cancellation Note  Patient Details Name: Cesar [name] MRN: [id number] DOB: 12-23-1928   Cancelled Treatment:    Reason Eval/Treat Not Completed: Patient at procedure or test/ unavailable (MBS)  Billey Chang, OTR/L  Acute Rehabilitation Services Pager: [phone] Office: [phone] .  11/29/2020, 9:31 AM

## 2020-11-29 NOTE — Progress Notes (Signed)
Physical Therapy Treatment Patient Details Name: Cesar [name] MRN: [id number] DOB: 09/22/1929 Today's Date: 11/29/2020    History of Present Illness Pt is 87 y.o. male with medical history significant of permanent A. fib on Eliquis, CKD stage III, CVA, seizure disorder, depression, history of DVT, hyperlipidemia, OSA not on CPAP, AAA, ascending thoracic aortic aneurysm followed by Dr. [name], severe aortic stenosis status post TAVR 10/22/2020, nonobstructive CAD presenting to the ED via EMS for evaluation of weakness and multiple falls. Head CT showing acute on chronic left cerebral convexity subdural hematoma. Pt s/p L sided burr hole x2 and placement of subdural drain for evacuation of SDH 11/21/20.  Drain has been removed.  Pt with decline in mental status and R UE weakness 2/21 - MRI was negative for acute changes, Pt with hx of seizures - now on continuous EEG.    PT Comments    Pt tolerates treatment well this session, progressing to ambulation for increased distances. Pt continues to demonstrate difficulty managing RUE when mobilizing, resulting in an inability to successfully manage RW. Pt is able to follow simple one step commands with RUE but has difficulty incorporating RUE into functional tasks. Pt will benefit from continued aggressive mobilization and PT POC to improve balance and use of RUE when mobilizing.  PT continues to recommend CIR placement at this time.  Follow Up Recommendations  CIR;Supervision for mobility/OOB     Equipment Recommendations  Wheelchair (measurements PT);Wheelchair cushion (measurements PT);Hospital bed (mechanical lift if home today)    Recommendations for Other Services       Precautions / Restrictions Precautions Precautions: Fall Precaution Comments: pt is inattentive to RUE, [place] with hearing aides Restrictions Weight Bearing Restrictions: No Other Position/Activity Restrictions: HOH, does have hearing aides    Mobility  Bed Mobility                     Transfers Overall transfer level: Needs assistance Equipment used: Rolling walker (2 wheeled) Transfers: Sit to/from [employer] Transfers Sit to Stand: Mod assist Stand pivot transfers: Mod assist       General transfer comment: pt requires verbal and visual cues for hand placement. At baseline spouse holds RW and pt pulls up with walker, may be difficult habit to break with new onset neuro deficits  Ambulation/Gait Ambulation/Gait assistance: Mod assist Gait Distance (Feet): 15 Feet (additional 10') Assistive device: Rolling walker (2 wheeled) Gait Pattern/deviations: Step-to pattern Gait velocity: reduced Gait velocity interpretation: <1.31 ft/sec, indicative of household ambulator General Gait Details: pt with difficulty maintaining RUE on RW, often walker drifts to R side as only LUE is applying force to translate forward. This results in maintaining his base of support outside of RW with significant anterior and right lateral lean. PT provides physical assistance to manage RW   Stairs             Wheelchair Mobility    Modified Rankin (Stroke Patients Only) Modified Rankin (Stroke Patients Only) Pre-Morbid Rankin Score: Moderate disability Modified Rankin: Severe disability     Balance Overall balance assessment: Needs assistance Sitting-balance support: No upper extremity supported Sitting balance-Leahy Scale: Fair     Standing balance support: Bilateral upper extremity supported Standing balance-Leahy Scale: Poor Standing balance comment: reliant on BUE support of RW, minA                            Cognition Arousal/Alertness: Awake/alert Behavior During Therapy: Impulsive Overall  Cognitive Status: Impaired/Different from baseline Area of Impairment: Attention;Following commands;Safety/judgement;Awareness;Problem solving                   Current Attention Level: Sustained Memory: Decreased recall of  precautions Following Commands: Follows one step commands consistently Safety/Judgement: Decreased awareness of safety;Decreased awareness of deficits Awareness: Intellectual Problem Solving: Slow processing;Difficulty sequencing;Requires verbal cues;Requires tactile cues General Comments: no verbal responses, attending some to the R due to wife being on R side. pt needs cues for pocketing. pt reaching to wipe mouth with R side food coming out but just wipes it with hand.      Exercises      General Comments General comments (skin integrity, edema, etc.): pt tachy up to 140 with mobility      Pertinent Vitals/Pain Pain Assessment: Faces Faces Pain Scale: No hurt    Home Living                      Prior Function            PT Goals (current goals can now be found in the care plan section) Acute Rehab PT Goals Patient Stated Goal: to improve ambulation Progress towards PT goals: Progressing toward goals    Frequency    Min 4X/week      PT Plan Current plan remains appropriate    Co-evaluation              AM-PAC PT "6 Clicks" Mobility   Outcome Measure  Help needed turning from your back to your side while in a flat bed without using bedrails?: A Little Help needed moving from lying on your back to sitting on the side of a flat bed without using bedrails?: A Lot Help needed moving to and from a bed to a chair (including a wheelchair)?: A Lot Help needed standing up from a chair using your arms (e.g., wheelchair or bedside chair)?: A Lot Help needed to walk in hospital room?: A Lot Help needed climbing 3-5 steps with a railing? : Total 6 Click Score: 12    End of Session Equipment Utilized During Treatment: Gait belt Activity Tolerance: Patient tolerated treatment well Patient left: in chair;with call bell/phone within reach;with chair alarm set;with family/visitor present Nurse Communication: Mobility status PT Visit Diagnosis: Unsteadiness on  feet (R26.81);Other abnormalities of gait and mobility (R26.89);Muscle weakness (generalized) (M62.81);Other symptoms and signs involving the nervous system (R29.898)     Time: 6568-1275 PT Time Calculation (min) (ACUTE ONLY): 25 min  Charges:  $Gait Training: 8-22 mins $Therapeutic Activity: 8-22 mins                     Zenaida Niece, PT, DPT Acute Rehabilitation Pager: [phone]    Zenaida Niece 11/29/2020, 1:37 PM

## 2020-11-30 ENCOUNTER — Inpatient Hospital Stay (HOSPITAL_COMMUNITY): Payer: Medicare Other

## 2020-11-30 DIAGNOSIS — I4891 Unspecified atrial fibrillation: Secondary | ICD-10-CM | POA: Diagnosis not present

## 2020-11-30 NOTE — Progress Notes (Signed)
Patient ID: Cesar [name], male   DOB: 1929/04/03, 87 y.o.   MRN: [id number] Vital signs stable Patient's level of consciousness is good Speech arrest has been problematic and aphasia persists I discussed this with his wife Cesar [name] today I have reviewed his scans and suggested that we will recheck a CT scan of his head to make sure that he does not have reaccumulation

## 2020-11-30 NOTE — Progress Notes (Signed)
PROGRESS NOTE    Cesar [name]  GXQ:[id number] DOB: 01-03-1929 DOA: 11/19/2020 PCP: Orpah Melter, MD   Chief Complaint  Patient presents with  . Weakness   Brief Narrative: 87 year old male with multiple complex comorbidities permanent A. fib on Eliquis, CKD stage III, CVA, seizure disorder, depression, hyperlipidemia, OSA not on CPAP, AAA/ascending thoracic aortic aneurysm followed by Dr. [name], severe aortic stenosis status post TAVR 10/22/20, nonobstructive CAD admitted for multiple falls and weakness and CT head showed acute on chronic left subdural convexity subdural hematoma up to 3 cm Neurosurgery was consulted Eliquis was held and patient was admitted on 2/15 by [facility] Patient underwent burr hole x2 on 2/17. Patient with prolonged hospitalization has had decreased level of consciousness on 2/21 brain imaging EEG were done no acute finding on imaging in no seizure EEG underwent long-term EEG as well. Has had similar episode in the past suspected to be focal seizure, already started on 500 mg Depakote nightly which he was taking regularly.  No evidence of UTI on UA.  Subjective: Seen and examined. Wife is at the bedside. No new complaints. Patient is alert awake moving all his extremities but speech is still not clear. No labs today and can obtain in am  Assessment & Plan:  SDH: s/p burr hole x2 on 2/17.  Continue PT OT, awaiting for CIR placement.  Can restart ASA in 1 week possible anticoagulation ~ 4-6 wk per Neurosurgery  Decreased level of consciousness unclear etiology:noted to have level of consciousness on 2/21- s/p brain imaging EEG-no acute finding on imaging in no seizure EEG underwent long-term EEG as well. Has had similar episode in the past suspected to be focal seizure, already started on 500 mg Depakote nightly which he was taking regularly-but level was low dose increased.No evidence of UTI on UA, culture no growth so far.  He has been more alert awake and  mentation seems to be improving.  Continue on supportive measures measures.  CKD stage IIIa w/ [place] metabolic acidosis, bicarb 20:: BUN/creatinine trend past year as below 1.3-1.6.  Remains a stable.  Repeat BMP in a.m. Recent Labs    10/22/20 0802 10/22/20 0937 10/22/20 1017 10/23/20 0009 10/23/20 0150 10/30/20 1520 11/19/20 1751 11/19/20 1818 11/25/20 1232 11/28/20 0314  BUN 30* 31* 29* 34* 37* 28* 20 24* 33* 43*  CREATININE 1.30* 1.40* 1.30* 1.66* 1.83* 1.28* 1.34* 1.40* 1.54* 1.39*   A. fib permanent/history of CVA with right-sided weakness: DOAC held due to SDH.  Aspirin also on hold.  Refer to #1 Severe AS s/p TAVR: Euvolemic.  Holding DAOC/ASA as above. BPH cont Proscar Severe hearing loss, supportive measures Generalized weakness/debility/deconditioning awaiting for CIR placement.   History of DVT-now on prophylactic subcu heparin OSA not on CPAP  Nutrition: Speech following Diet Order            DIET - DYS 1 Room service appropriate? No; Fluid consistency: Thin  Diet effective now                 DVT prophylaxis: heparin injection 5,000 Units Start: 11/26/20 0800 SCDs Start: 11/21/20 1939 Code Status:   Code Status: Full Code  Family Communication: plan of care discussed with patient and his wife at the bedside.  Status is: Inpatient Remains inpatient appropriate because:Inpatient level of care appropriate due to severity of illness  Dispo: The patient is from: Home              Anticipated d/c is to: CIR once  bed available, defer to primary team              Patient currently is medically stable   Difficult to place patient No  Consultants:see note  Procedures:see note  Unresulted Labs (From admission, onward)          Start     Ordered   12/01/20 0500  Comprehensive metabolic panel  Tomorrow morning,   R       Question:  Specimen collection method  Answer:  Lab=Lab collect   11/30/20 0836   12/01/20 0500  CBC  Tomorrow morning,   R        Question:  Specimen collection method  Answer:  Lab=Lab collect   11/30/20 0836   11/25/20 1403  Urine Culture  Add-on,   AD        11/25/20 1409          Culture/Microbiology No results found for: SDES, SPECREQUEST, CULT, REPTSTATUS  Other culture-see note  Medications: Scheduled Meds: . chlorhexidine  15 mL Mouth/Throat Once   Or  . mouth rinse  15 mL Mouth Rinse Once  . divalproex  250 mg Oral Q6H  . docusate sodium  100 mg Oral BID  . finasteride  5 mg Oral Daily  . heparin  5,000 Units Subcutaneous Q12H  . pantoprazole  40 mg Oral Q supper   Continuous Infusions: . sodium chloride 10 mL/hr at 11/26/20 1400  . niCARDipine      Antimicrobials: Anti-infectives (From admission, onward)   Start     Dose/Rate Route Frequency Ordered Stop   11/25/20 2200  sulfamethoxazole-trimethoprim (BACTRIM) 160 mg in dextrose 5 % 250 mL IVPB  Status:  Discontinued        160 mg 260 mL/hr over 60 Minutes Intravenous Every 12 hours 11/25/20 2111 11/25/20 2116   11/25/20 1500  sulfamethoxazole-trimethoprim (BACTRIM DS) 800-160 MG per tablet 1 tablet  Status:  Discontinued        1 tablet Oral Every 12 hours 11/25/20 1409 11/25/20 2116   11/21/20 2330  ceFAZolin (ANCEF) IVPB 1 g/50 mL premix        1 g 100 mL/hr over 30 Minutes Intravenous Every 8 hours 11/21/20 1938 11/23/20 1607     Objective: Vitals: Today's Vitals   11/29/20 2000 11/29/20 2316 11/30/20 0400 11/30/20 0752  BP:  (!) 144/70 116/65 135/86  Pulse:  66 72 64  Resp:  18 20 (!) 21  Temp:  98.4 F (36.9 C) 98.2 F (36.8 C) (!) 97.5 F (36.4 C)  TempSrc:  Oral Oral Oral  SpO2:  98% 98% 99%  PainSc: 0-No pain       Intake/Output Summary (Last 24 hours) at 11/30/2020 0837 Last data filed at 11/30/2020 0456 Gross per 24 hour  Intake 480 ml  Output 850 ml  Net -370 ml   There were no vitals filed for this visit. Weight change:   Intake/Output from previous day: 02/25 0701 - 02/26 0700 In: 720 [P.O.:720] Out:  1250 [Urine:1250] Intake/Output this shift: No intake/output data recorded. There were no vitals filed for this visit.  Examination: General exam: AA oriented speech with dysarthria, weak and frail.  HEENT:Oral mucosa moist, Ear/Nose WNL grossly, dentition normal. Respiratory system: bilaterally clear,no wheezing or crackles,no use of accessory muscle Cardiovascular system: S1 & S2 +, No JVD,. Gastrointestinal system: Abdomen soft, NT,ND, BS+ Nervous System:Alert, awake, moving extremities and grossly nonfocal Extremities: No edema, distal peripheral pulses palpable.  Skin: No rashes,no icterus. MSK:  Normal muscle bulk,tone, power Scalp surgical site with intact staples.  Data Reviewed: I have personally reviewed following labs and imaging studies CBC: Recent Labs  Lab 11/25/20 1232 11/28/20 0314  WBC 6.4 6.9  NEUTROABS 4.5  --   HGB 12.9* 11.6*  HCT 40.4 33.4*  MCV 98.3 95.4  PLT 132* 453*   Basic Metabolic Panel: Recent Labs  Lab 11/25/20 1232 11/28/20 0314  NA 135 138  K 4.4 4.5  CL 99 106  CO2 27 20*  GLUCOSE 127* 127*  BUN 33* 43*  CREATININE 1.54* 1.39*  CALCIUM 8.8* 8.6*   GFR: CrCl cannot be calculated (Unknown ideal weight.). Liver Function Tests: No results for input(s): AST, ALT, ALKPHOS, BILITOT, PROT, ALBUMIN in the last 168 hours. No results for input(s): LIPASE, AMYLASE in the last 168 hours. No results for input(s): AMMONIA in the last 168 hours. Coagulation Profile: No results for input(s): INR, PROTIME in the last 168 hours. Cardiac Enzymes: No results for input(s): CKTOTAL, CKMB, CKMBINDEX, TROPONINI in the last 168 hours. BNP (last 3 results) No results for input(s): PROBNP in the last 8760 hours. HbA1C: No results for input(s): HGBA1C in the last 72 hours. CBG: No results for input(s): GLUCAP in the last 168 hours. Lipid Profile: No results for input(s): CHOL, HDL, LDLCALC, TRIG, CHOLHDL, LDLDIRECT in the last 72 hours. Thyroid  Function Tests: No results for input(s): TSH, T4TOTAL, FREET4, T3FREE, THYROIDAB in the last 72 hours. Anemia Panel: No results for input(s): VITAMINB12, FOLATE, FERRITIN, TIBC, IRON, RETICCTPCT in the last 72 hours. Sepsis Labs: No results for input(s): PROCALCITON, LATICACIDVEN in the last 168 hours.  No results found for this or any previous visit (from the past 240 hour(s)).   Radiology Studies: DG Swallowing Func-Speech Pathology  Result Date: 11/29/2020 Objective Swallowing Evaluation: Type of Study: MBS-Modified Barium Swallow Study  Patient Details Name: ANGELES PAOLUCCI MRN: [id number] Date of Birth: [birth year] Today's Date: 11/29/2020 Time: SLP Start Time (ACUTE ONLY): 0940 -SLP Stop Time (ACUTE ONLY): 0958 SLP Time Calculation (min) (ACUTE ONLY): 18 min Past Medical History: Past Medical History: Diagnosis Date . AAA (abdominal aortic aneurysm) ([place])  . CKD (chronic kidney disease), stage III ([facility])   baseline Cr 1.4-1.5 in 2016-2017 by PCP) . CVA (cerebral infarction)  . Depression  . DVT (deep venous thrombosis) ([place]) 2006 . H/O blood clots  . Hematuria  . HOH (hard of hearing)  . Hypercholesterolemia  . OSA (obstructive sleep apnea)   intol to CPAP . Permanent atrial fibrillation ([place])  . Prostate enlargement  . Pulmonary nodules  . S/P TAVR (transcatheter aortic valve replacement) 10/22/2020  s/p TAVR with a 29 mm Edwards S3U via the TF approch by Dr. Buena Irish and Dr. Roxy Manns . Thoracic ascending aortic aneurysm ([place])   4.5cm in 11/2018, stable since 2016 . TIA (transient ischemic attack)  Past Surgical History: Past Surgical History: Procedure Laterality Date . BURR HOLE Left 11/21/2020  Procedure: LEFT Side BURR HOLES,;  Surgeon: [name], MD;  Location: [place];  Service: Neurosurgery;  Laterality: Left; . EMBOLECTOMY Left 07/02/2016  Procedure: EMBOLECTOMY LEFT FEMORAL ARTERY;  Surgeon: [name], MD;  Location: [place];  Service: Vascular;  Laterality: Left; Marland Kitchen MESENTERIC ARTERY BYPASS  N/A 08/06/2019  Procedure: SUPERIOR MESENTERIC ARTERY THROMBECTOMY;  Surgeon: [name], MD;  Location: [place];  Service: Vascular;  Laterality: N/A; . NM [place]  08/24/2002  markedly positive,subtle lateral ischemia . PATCH ANGIOPLASTY  08/06/2019  Procedure:  Patch Angioplasty of superior messenteric artery with RIGHT femoral vein;  Surgeon: [name], MD;  Location: [place];  Service: Vascular;; . PROSTATE SURGERY   . RIGHT HEART CATH AND CORONARY ANGIOGRAPHY N/A 10/21/2020  Procedure: RIGHT HEART CATH AND CORONARY ANGIOGRAPHY;  Surgeon: [name], MD;  Location: [place] [facility];  Service: Cardiovascular;  Laterality: N/A; . TEE WITHOUT CARDIOVERSION N/A 10/22/2020  Procedure: TRANSESOPHAGEAL ECHOCARDIOGRAM (TEE);  Surgeon: [name], MD;  Location: [place];  Service: Open Heart Surgery;  Laterality: N/A; . TRANSCATHETER AORTIC VALVE REPLACEMENT, TRANSFEMORAL Right 10/22/2020  Procedure: TRANSCATHETER AORTIC VALVE REPLACEMENT, TRANSFEMORAL;  Surgeon: [name], MD;  Location: [place];  Service: Open Heart Surgery;  Laterality: Right; . US ECHOCARDIOGRAPHY  01/06/2012  mod. AOV ca+,mild to mod. AS,mod mostly posterior MAC-mild MR HPI: 87 y.o. male with medical history significant of permanent A. fib, CKD stage III, CVA, seizure disorder, depression, history of DVT, hyperlipidemia, OSA not on CPAP, AAA, ascending thoracic aortic aneurysm, severe aortic stenosis status post TAVR 10/22/2020, nonobstructive CAD presenting to the ED via EMS for evaluation of weakness and multiple falls. Head CT showing acute on chronic left cerebral convexity subdural hematoma. Pt s/p L sided burr hole x2 and placement of subdural drain for evacuation of SDH 11/21/20. On 2/20 pt exhibited aphasia, dysarthria and increased weakness on right. MMRI negative for changes. On continuous EEG.  Subjective: alert, daughter at bedside; now aphasic Assessment / Plan / Recommendation  CHL IP CLINICAL IMPRESSIONS 11/29/2020 Clinical Impression Pt demonstrated mild oropharyngeal dysphagia without penetration or aspiration. Lower cervival osteophytes not interferring with pharyngeal function. Although he wears upper dentures, wife reports they are ill fitting since admission. Oral phase marked by anterior spill on right and mild buccal cavity pocketing. From pharyngeal phase his laryngeal closure and tracheal coverage via epiglottis was adequate. Timing occasionally delayed with barium sitting in vallecuale for 4 seconds prior to initiation of movement. Pharyngeal integrity challenged with sequential, large straw sips thin barium x 2.  Vallecular and pyriform sinus residue was trace-min. Transit of pill in puree was cohesive and pill stopped distal esophagus with barium building slightly on top. Additional trial puree advanced pill into stomach. Wife stated pt has not reported esophageal challenges in the past. Recommend continue Dys 1, upgrade to thin liquids, cup sips preferred, remain upright after meals 30 min. As therapist documenting, wife reported pt's upper denture plate donned and was fitting. Continue ST. SLP Visit Diagnosis Dysphagia, oropharyngeal phase (R13.12) Attention and concentration deficit following -- Frontal lobe and executive function deficit following -- Impact on safety and function Mild aspiration risk   CHL IP TREATMENT RECOMMENDATION 11/29/2020 Treatment Recommendations Therapy as outlined in treatment plan below   Prognosis 11/29/2020 Prognosis for Safe Diet Advancement Good Barriers to Reach Goals Cognitive deficits Barriers/Prognosis Comment -- CHL IP DIET RECOMMENDATION 11/29/2020 SLP Diet Recommendations Dysphagia 1 (Puree) solids;Thin liquid Liquid Administration via Cup Medication Administration Whole meds with puree Compensations Minimize environmental distractions;Slow rate;Small sips/bites;Lingual sweep for clearance of pocketing Postural Changes Seated upright at  90 degrees;Remain semi-upright after after feeds/meals (Comment)   CHL IP OTHER RECOMMENDATIONS 11/29/2020 Recommended Consults -- Oral Care Recommendations Oral care BID Other Recommendations --   CHL IP FOLLOW UP RECOMMENDATIONS 11/29/2020 Follow up Recommendations Skilled Nursing facility   [facility] IP FREQUENCY AND DURATION 11/29/2020 Speech Therapy Frequency (ACUTE ONLY) min 2x/week Treatment Duration 2 weeks      CHL IP ORAL PHASE 11/29/2020 Oral Phase Impaired Oral - Pudding  Teaspoon -- Oral - Pudding Cup -- Oral - Honey Teaspoon -- Oral - Honey Cup -- Oral - Nectar Teaspoon -- Oral - Nectar Cup Right pocketing in lateral sulci Oral - Nectar Straw -- Oral - Thin Teaspoon -- Oral - Thin Cup Right anterior bolus loss Oral - Thin Straw WFL Oral - Puree WFL Oral - Mech Soft -- Oral - Regular -- Oral - Multi-Consistency -- Oral - Pill WFL Oral Phase - Comment --  CHL IP PHARYNGEAL PHASE 11/29/2020 Pharyngeal Phase Impaired Pharyngeal- Pudding Teaspoon -- Pharyngeal -- Pharyngeal- Pudding Cup -- Pharyngeal -- Pharyngeal- Honey Teaspoon -- Pharyngeal -- Pharyngeal- Honey Cup -- Pharyngeal -- Pharyngeal- Nectar Teaspoon -- Pharyngeal -- Pharyngeal- Nectar Cup Pharyngeal residue - valleculae Pharyngeal -- Pharyngeal- Nectar Straw Delayed swallow initiation-vallecula;Pharyngeal residue - valleculae Pharyngeal -- Pharyngeal- Thin Teaspoon -- Pharyngeal -- Pharyngeal- Thin Cup Pharyngeal residue - valleculae;Delayed swallow initiation-vallecula;Pharyngeal residue - pyriform Pharyngeal -- Pharyngeal- Thin Straw Pharyngeal residue - pyriform Pharyngeal -- Pharyngeal- Puree WFL Pharyngeal -- Pharyngeal- Mechanical Soft -- Pharyngeal -- Pharyngeal- Regular -- Pharyngeal -- Pharyngeal- Multi-consistency -- Pharyngeal -- Pharyngeal- Pill WFL Pharyngeal -- Pharyngeal Comment --  CHL IP CERVICAL ESOPHAGEAL PHASE 11/29/2020 Cervical Esophageal Phase (No Data) Pudding Teaspoon -- Pudding Cup -- Honey Teaspoon -- Honey Cup -- Nectar Teaspoon  -- Nectar Cup -- Nectar Straw -- Thin Teaspoon -- Thin Cup -- Thin Straw -- Puree -- Mechanical Soft -- Regular -- Multi-consistency -- Pill -- Cervical Esophageal Comment -- [name] 11/29/2020, 11:32 AM  [name].Ed Actor Pager [phone] Office [phone]               LOS: 10 days   Antonieta Pert, MD [facility]  11/30/2020, 8:37 AM

## 2020-12-01 DIAGNOSIS — I4891 Unspecified atrial fibrillation: Secondary | ICD-10-CM | POA: Diagnosis not present

## 2020-12-01 LAB — COMPREHENSIVE METABOLIC PANEL
ALT: 15 U/L (ref 0–44)
AST: 23 U/L (ref 15–41)
Albumin: 2.7 g/dL — ABNORMAL LOW (ref 3.5–5.0)
Alkaline Phosphatase: 50 U/L (ref 38–126)
Anion gap: 10 (ref 5–15)
BUN: 38 mg/dL — ABNORMAL HIGH (ref 8–23)
CO2: 25 mmol/L (ref 22–32)
Calcium: 8.8 mg/dL — ABNORMAL LOW (ref 8.9–10.3)
Chloride: 106 mmol/L (ref 98–111)
Creatinine, Ser: 1.5 mg/dL — ABNORMAL HIGH (ref 0.61–1.24)
GFR, Estimated: 44 mL/min — ABNORMAL LOW (ref >60)
Glucose, Bld: 102 mg/dL — ABNORMAL HIGH (ref 70–99)
Potassium: 4.6 mmol/L (ref 3.5–5.1)
Sodium: 141 mmol/L (ref 135–145)
Total Bilirubin: 0.7 mg/dL (ref 0.3–1.2)
Total Protein: 5.7 g/dL — ABNORMAL LOW (ref 6.5–8.1)

## 2020-12-01 LAB — CBC
HCT: 36.8 % — ABNORMAL LOW (ref 39.0–52.0)
Hemoglobin: 11.8 g/dL — ABNORMAL LOW (ref 13.0–17.0)
MCH: 31.7 pg (ref 26.0–34.0)
MCHC: 32.1 g/dL (ref 30.0–36.0)
MCV: 98.9 fL (ref 80.0–100.0)
Platelets: 106 10*3/uL — ABNORMAL LOW (ref 150–400)
RBC: 3.72 MIL/uL — ABNORMAL LOW (ref 4.22–5.81)
RDW: 13.3 % (ref 11.5–15.5)
WBC: 6.7 10*3/uL (ref 4.0–10.5)
nRBC: 0 % (ref 0.0–0.2)

## 2020-12-01 NOTE — Progress Notes (Signed)
PROGRESS NOTE    GILBERT MANOLIS  IHW:[id number] DOB: 07/12/1929 DOA: 11/19/2020 PCP: Orpah Melter, MD   Chief Complaint  Patient presents with  . Weakness   Brief Narrative: 87 year old male with multiple complex comorbidities permanent A. fib on Eliquis, CKD stage III, CVA, seizure disorder, depression, hyperlipidemia, OSA not on CPAP, AAA/ascending thoracic aortic aneurysm followed by Dr. [name], severe aortic stenosis status post TAVR 10/22/20, nonobstructive CAD admitted for multiple falls and weakness and CT head showed acute on chronic left subdural convexity subdural hematoma up to 3 cm Neurosurgery was consulted Eliquis was held and patient was admitted on 2/15 by [facility] Patient underwent burr hole x2 on 2/17. Patient with prolonged hospitalization has had decreased level of consciousness on 2/21 brain imaging EEG were done no acute finding on imaging in no seizure EEG underwent long-term EEG as well. Has had similar episode in the past suspected to be focal seizure, already started on 500 mg Depakote nightly which he was taking regularly.  No evidence of UTI on UA. 2/26 patient underwent repeat CT head that shows decreased conspicuity of left cerebral convexity subdural collection and tiny foci of pneumocephalus, 2 to 3 mm rightward midline shift  Subjective:  Seen and examined this morning. Wife at the bedside, Dr. Ellene Route also arrived. Overnight afebrile. Patient had repeat CT head yesterday  Assessment & Plan:  SDH: s/p burr hole x2 on 2/17.  Continue PT OT, awaiting for CIR placement.  Can restart ASA in 1 week possible anticoagulation ~ 4-6 wk per Neurosurgery.  Had repeat CT head yesterday with decreased subdural collection and 2 to 3 mm rightward midline shift-continue per neurosurgery  Decreased level of consciousness unclear etiology:noted to have level of consciousness on 2/21- s/p brain imaging EEG-no acute finding on imaging in no seizure EEG underwent long-term EEG  as well. Has had similar episode in the past suspected to be focal seizure, already started on 500 mg Depakote nightly which he was taking regularly-but level was low dose increased.No evidence of UTI on UA, culture no growth so far.  He has been more alert and awake at this time continue supportive measurement PT OT and CIR placement  CKD stage IIIa w/ [place] metabolic acidosis, bicarb 20-has improved to 25:: BUN/creatinine trend past year as below 1.3-1.6.  Renal function remains stable at baseline.   Recent Labs    10/22/20 0937 10/22/20 1017 10/23/20 0009 10/23/20 0150 10/30/20 1520 11/19/20 1751 11/19/20 1818 11/25/20 1232 11/28/20 0314 12/01/20 0610  BUN 31* 29* 34* 37* 28* 20 24* 33* 43* 38*  CREATININE 1.40* 1.30* 1.66* 1.83* 1.28* 1.34* 1.40* 1.54* 1.39* 1.50*   A. fib permanent/history of CVA with right-sided weakness: DOAC held due to SDH.  Aspirin also on hold.  Refer to #1 Severe AS s/p TAVR: Euvolemic.  Holding DAOC/ASA as above. BPH cont Proscar Severe hearing loss, supportive measures Generalized weakness/debility/deconditioning awaiting for CIR placement.   History of DVT-continue DVT prophylaxis with heparin OSA not on CPAP  Nutrition: Speech following Diet Order            DIET - DYS 1 Room service appropriate? No; Fluid consistency: Thin  Diet effective now                 DVT prophylaxis: heparin injection 5,000 Units Start: 11/26/20 0800 SCDs Start: 11/21/20 1939 Code Status:   Code Status: Full Code  Family Communication: plan of care discussed with patient and his wife at the bedside.  Status is:  Inpatient Remains inpatient appropriate because:Inpatient level of care appropriate due to severity of illness  Dispo: The patient is from: Home              Anticipated d/c is to: CIR once bed available, defer to primary team for timing, he appears stable.               Patient currently is medically stable   Difficult to place patient  No  Consultants:see note  Procedures:see note  Unresulted Labs (From admission, onward)          Start     Ordered   12/01/20 0500  CBC  Tomorrow morning,   R       Question:  Specimen collection method  Answer:  Lab=Lab collect   11/30/20 0836   11/25/20 1403  Urine Culture  Add-on,   AD        11/25/20 1409          Culture/Microbiology No results found for: SDES, SPECREQUEST, CULT, REPTSTATUS  Other culture-see note  Medications: Scheduled Meds: . chlorhexidine  15 mL Mouth/Throat Once   Or  . mouth rinse  15 mL Mouth Rinse Once  . divalproex  250 mg Oral Q6H  . docusate sodium  100 mg Oral BID  . finasteride  5 mg Oral Daily  . heparin  5,000 Units Subcutaneous Q12H  . pantoprazole  40 mg Oral Q supper   Continuous Infusions: . sodium chloride 10 mL/hr at 11/26/20 1400  . niCARDipine      Antimicrobials: Anti-infectives (From admission, onward)   Start     Dose/Rate Route Frequency Ordered Stop   11/25/20 2200  sulfamethoxazole-trimethoprim (BACTRIM) 160 mg in dextrose 5 % 250 mL IVPB  Status:  Discontinued        160 mg 260 mL/hr over 60 Minutes Intravenous Every 12 hours 11/25/20 2111 11/25/20 2116   11/25/20 1500  sulfamethoxazole-trimethoprim (BACTRIM DS) 800-160 MG per tablet 1 tablet  Status:  Discontinued        1 tablet Oral Every 12 hours 11/25/20 1409 11/25/20 2116   11/21/20 2330  ceFAZolin (ANCEF) IVPB 1 g/50 mL premix        1 g 100 mL/hr over 30 Minutes Intravenous Every 8 hours 11/21/20 1938 11/23/20 1607     Objective: Vitals: Today's Vitals   11/30/20 2001 11/30/20 2313 12/01/20 0000 12/01/20 0501  BP: 113/60 (!) 122/59  122/66  Pulse: 66 73  72  Resp: (!) 21 (!) 24  (!) 24  Temp: 98.3 F (36.8 C) 98.5 F (36.9 C)  97.8 F (36.6 C)  TempSrc: Oral Oral  Oral  SpO2: 95% 96%  98%  PainSc:   Asleep     Intake/Output Summary (Last 24 hours) at 12/01/2020 0750 Last data filed at 12/01/2020 0536 Gross per 24 hour  Intake 720 ml   Output 1150 ml  Net -430 ml   There were no vitals filed for this visit. Weight change:   Intake/Output from previous day: 02/26 0701 - 02/27 0700 In: 720 [P.O.:720] Out: 1150 [Urine:1150] Intake/Output this shift: No intake/output data recorded. There were no vitals filed for this visit.  Examination: General exam: Alert awake at baseline with speech difficulty/dysarthria. Elderly, frail HEENT:Oral mucosa moist, Ear/Nose WNL grossly, dentition normal. Respiratory system: bilaterally clear,no wheezing or crackles,no use of accessory muscle Cardiovascular system: S1 & S2 +, No JVD,. Gastrointestinal system: Abdomen soft, NT,ND, BS+ Nervous System:Alert, awake, moving all his extremities. Extremities: No  edema, distal peripheral pulses palpable.  Skin: No rashes,no icterus. MSK: Normal muscle bulk,tone, power Scalp surgical site with intact staples  Data Reviewed: I have personally reviewed following labs and imaging studies CBC: Recent Labs  Lab 11/25/20 1232 11/28/20 0314  WBC 6.4 6.9  NEUTROABS 4.5  --   HGB 12.9* 11.6*  HCT 40.4 33.4*  MCV 98.3 95.4  PLT 132* 616*   Basic Metabolic Panel: Recent Labs  Lab 11/25/20 1232 11/28/20 0314 12/01/20 0610  NA 135 138 141  K 4.4 4.5 4.6  CL 99 106 106  CO2 27 20* 25  GLUCOSE 127* 127* 102*  BUN 33* 43* 38*  CREATININE 1.54* 1.39* 1.50*  CALCIUM 8.8* 8.6* 8.8*   GFR: CrCl cannot be calculated (Unknown ideal weight.). Liver Function Tests: Recent Labs  Lab 12/01/20 0610  AST 23  ALT 15  ALKPHOS 50  BILITOT 0.7  PROT 5.7*  ALBUMIN 2.7*   No results for input(s): LIPASE, AMYLASE in the last 168 hours. No results for input(s): AMMONIA in the last 168 hours. Coagulation Profile: No results for input(s): INR, PROTIME in the last 168 hours. Cardiac Enzymes: No results for input(s): CKTOTAL, CKMB, CKMBINDEX, TROPONINI in the last 168 hours. BNP (last 3 results) No results for input(s): PROBNP in the last 8760  hours. HbA1C: No results for input(s): HGBA1C in the last 72 hours. CBG: No results for input(s): GLUCAP in the last 168 hours. Lipid Profile: No results for input(s): CHOL, HDL, LDLCALC, TRIG, CHOLHDL, LDLDIRECT in the last 72 hours. Thyroid Function Tests: No results for input(s): TSH, T4TOTAL, FREET4, T3FREE, THYROIDAB in the last 72 hours. Anemia Panel: No results for input(s): VITAMINB12, FOLATE, FERRITIN, TIBC, IRON, RETICCTPCT in the last 72 hours. Sepsis Labs: No results for input(s): PROCALCITON, LATICACIDVEN in the last 168 hours.  No results found for this or any previous visit (from the past 240 hour(s)).   Radiology Studies: CT HEAD WO CONTRAST  Result Date: 11/30/2020 CLINICAL DATA:  Subdural hemorrhage, follow up EXAM: CT HEAD WITHOUT CONTRAST TECHNIQUE: Contiguous axial images were obtained from the base of the skull through the vertex without intravenous contrast. COMPARISON:  11/26/2020 and prior. FINDINGS: Brain: Residual left cerebral convexity subdural collection and tiny foci of pneumocephalus are slightly less conspicuous than prior exam. Chronic bilateral frontal insults. No ventriculomegaly. 2-3 mm rightward midline shift. No mass lesion. Mild cerebral atrophy with ex vacuo dilatation. Chronic microvascular ischemic changes. Vascular: No hyperdense vessel or unexpected calcification. Skull: Postsurgical appearance of the left calvarium. Sinuses/Orbits: Normal orbits. Tiny ethmoid and left maxillary sinus mucous retention cyst. No mastoid effusion. Other: None. IMPRESSION: Decreased conspicuity of left cerebral convexity subdural collection and tiny foci of pneumocephalus. Chronic bilateral frontal insults and microvascular ischemic changes. 2-3 mm rightward midline shift. Electronically Signed   By: Primitivo Gauze M.D.   On: 11/30/2020 14:38   DG Swallowing Func-Speech Pathology  Result Date: 11/29/2020 Objective Swallowing Evaluation: Type of Study: MBS-Modified  Barium Swallow Study  Patient Details Name: INFANT ZINK MRN: [id number] Date of Birth: [birth year] Today's Date: 11/29/2020 Time: SLP Start Time (ACUTE ONLY): 0940 -SLP Stop Time (ACUTE ONLY): 0958 SLP Time Calculation (min) (ACUTE ONLY): 18 min Past Medical History: Past Medical History: Diagnosis Date . AAA (abdominal aortic aneurysm) ([place])  . CKD (chronic kidney disease), stage III ([facility])   baseline Cr 1.4-1.5 in 2016-2017 by PCP) . CVA (cerebral infarction)  . Depression  . DVT (deep venous thrombosis) ([place]) 2006 . H/O blood clots  .  Hematuria  . HOH (hard of hearing)  . Hypercholesterolemia  . OSA (obstructive sleep apnea)   intol to CPAP . Permanent atrial fibrillation ([place])  . Prostate enlargement  . Pulmonary nodules  . S/P TAVR (transcatheter aortic valve replacement) 10/22/2020  s/p TAVR with a 29 mm Edwards S3U via the TF approch by Dr. Buena Irish and Dr. Roxy Manns . Thoracic ascending aortic aneurysm ([place])   4.5cm in 11/2018, stable since 2016 . TIA (transient ischemic attack)  Past Surgical History: Past Surgical History: Procedure Laterality Date . BURR HOLE Left 11/21/2020  Procedure: LEFT Side BURR HOLES,;  Surgeon: [name], MD;  Location: [facility];  Service: Neurosurgery;  Laterality: Left; . EMBOLECTOMY Left 07/02/2016  Procedure: EMBOLECTOMY LEFT FEMORAL ARTERY;  Surgeon: [name], MD;  Location: [facility] [place];  Service: Vascular;  Laterality: Left; Marland Kitchen MESENTERIC ARTERY BYPASS N/A 08/06/2019  Procedure: SUPERIOR MESENTERIC ARTERY THROMBECTOMY;  Surgeon: [name], MD;  Location: [place];  Service: Vascular;  Laterality: N/A; . NM [place]  08/24/2002  markedly positive,subtle lateral ischemia . PATCH ANGIOPLASTY  08/06/2019  Procedure: Patch Angioplasty of superior messenteric artery with RIGHT femoral vein;  Surgeon: [name], MD;  Location: [place];  Service: Vascular;; . PROSTATE SURGERY   . RIGHT HEART CATH AND CORONARY ANGIOGRAPHY N/A 10/21/2020  Procedure: RIGHT  HEART CATH AND CORONARY ANGIOGRAPHY;  Surgeon: [name], MD;  Location: [facility];  Service: Cardiovascular;  Laterality: N/A; . TEE WITHOUT CARDIOVERSION N/A 10/22/2020  Procedure: TRANSESOPHAGEAL ECHOCARDIOGRAM (TEE);  Surgeon: [name], MD;  Location: [facility];  Service: Open Heart Surgery;  Laterality: N/A; . TRANSCATHETER AORTIC VALVE REPLACEMENT, TRANSFEMORAL Right 10/22/2020  Procedure: TRANSCATHETER AORTIC VALVE REPLACEMENT, TRANSFEMORAL;  Surgeon: [name], MD;  Location: [place];  Service: Open Heart Surgery;  Laterality: Right; . US ECHOCARDIOGRAPHY  01/06/2012  mod. AOV ca+,mild to mod. AS,mod mostly posterior MAC-mild MR HPI: 87 y.o. male with medical history significant of permanent A. fib, CKD stage III, CVA, seizure disorder, depression, history of DVT, hyperlipidemia, OSA not on CPAP, AAA, ascending thoracic aortic aneurysm, severe aortic stenosis status post TAVR 10/22/2020, nonobstructive CAD presenting to the ED via EMS for evaluation of weakness and multiple falls. Head CT showing acute on chronic left cerebral convexity subdural hematoma. Pt s/p L sided burr hole x2 and placement of subdural drain for evacuation of SDH 11/21/20. On 2/20 pt exhibited aphasia, dysarthria and increased weakness on right. MMRI negative for changes. On continuous EEG.  Subjective: alert, daughter at bedside; now aphasic Assessment / Plan / Recommendation CHL IP CLINICAL IMPRESSIONS 11/29/2020 Clinical Impression Pt demonstrated mild oropharyngeal dysphagia without penetration or aspiration. Lower cervival osteophytes not interferring with pharyngeal function. Although he wears upper dentures, wife reports they are ill fitting since admission. Oral phase marked by anterior spill on right and mild buccal cavity pocketing. From pharyngeal phase his laryngeal closure and tracheal coverage via epiglottis was adequate. Timing occasionally delayed with barium sitting in vallecuale for 4  seconds prior to initiation of movement. Pharyngeal integrity challenged with sequential, large straw sips thin barium x 2.  Vallecular and pyriform sinus residue was trace-min. Transit of pill in puree was cohesive and pill stopped distal esophagus with barium building slightly on top. Additional trial puree advanced pill into stomach. Wife stated pt has not reported esophageal challenges in the past. Recommend continue Dys 1, upgrade to thin liquids, cup sips preferred, remain upright after meals 30 min. As  therapist documenting, wife reported pt's upper denture plate donned and was fitting. Continue ST. SLP Visit Diagnosis Dysphagia, oropharyngeal phase (R13.12) Attention and concentration deficit following -- Frontal lobe and executive function deficit following -- Impact on safety and function Mild aspiration risk   CHL IP TREATMENT RECOMMENDATION 11/29/2020 Treatment Recommendations Therapy as outlined in treatment plan below   Prognosis 11/29/2020 Prognosis for Safe Diet Advancement Good Barriers to Reach Goals Cognitive deficits Barriers/Prognosis Comment -- CHL IP DIET RECOMMENDATION 11/29/2020 SLP Diet Recommendations Dysphagia 1 (Puree) solids;Thin liquid Liquid Administration via Cup Medication Administration Whole meds with puree Compensations Minimize environmental distractions;Slow rate;Small sips/bites;Lingual sweep for clearance of pocketing Postural Changes Seated upright at 90 degrees;Remain semi-upright after after feeds/meals (Comment)   CHL IP OTHER RECOMMENDATIONS 11/29/2020 Recommended Consults -- Oral Care Recommendations Oral care BID Other Recommendations --   CHL IP FOLLOW UP RECOMMENDATIONS 11/29/2020 Follow up Recommendations Skilled Nursing facility   [facility] IP FREQUENCY AND DURATION 11/29/2020 Speech Therapy Frequency (ACUTE ONLY) min 2x/week Treatment Duration 2 weeks      CHL IP ORAL PHASE 11/29/2020 Oral Phase Impaired Oral - Pudding Teaspoon -- Oral - Pudding Cup -- Oral - Honey Teaspoon --  Oral - Honey Cup -- Oral - Nectar Teaspoon -- Oral - Nectar Cup Right pocketing in lateral sulci Oral - Nectar Straw -- Oral - Thin Teaspoon -- Oral - Thin Cup Right anterior bolus loss Oral - Thin Straw WFL Oral - Puree WFL Oral - Mech Soft -- Oral - Regular -- Oral - Multi-Consistency -- Oral - Pill WFL Oral Phase - Comment --  CHL IP PHARYNGEAL PHASE 11/29/2020 Pharyngeal Phase Impaired Pharyngeal- Pudding Teaspoon -- Pharyngeal -- Pharyngeal- Pudding Cup -- Pharyngeal -- Pharyngeal- Honey Teaspoon -- Pharyngeal -- Pharyngeal- Honey Cup -- Pharyngeal -- Pharyngeal- Nectar Teaspoon -- Pharyngeal -- Pharyngeal- Nectar Cup Pharyngeal residue - valleculae Pharyngeal -- Pharyngeal- Nectar Straw Delayed swallow initiation-vallecula;Pharyngeal residue - valleculae Pharyngeal -- Pharyngeal- Thin Teaspoon -- Pharyngeal -- Pharyngeal- Thin Cup Pharyngeal residue - valleculae;Delayed swallow initiation-vallecula;Pharyngeal residue - pyriform Pharyngeal -- Pharyngeal- Thin Straw Pharyngeal residue - pyriform Pharyngeal -- Pharyngeal- Puree WFL Pharyngeal -- Pharyngeal- Mechanical Soft -- Pharyngeal -- Pharyngeal- Regular -- Pharyngeal -- Pharyngeal- Multi-consistency -- Pharyngeal -- Pharyngeal- Pill WFL Pharyngeal -- Pharyngeal Comment --  CHL IP CERVICAL ESOPHAGEAL PHASE 11/29/2020 Cervical Esophageal Phase (No Data) Pudding Teaspoon -- Pudding Cup -- Honey Teaspoon -- Honey Cup -- Nectar Teaspoon -- Nectar Cup -- Nectar Straw -- Thin Teaspoon -- Thin Cup -- Thin Straw -- Puree -- Mechanical Soft -- Regular -- Multi-consistency -- Pill -- Cervical Esophageal Comment -- [name] 11/29/2020, 11:32 AM  [name].Ed Actor Pager [phone] Office [phone]               LOS: 11 days   Antonieta Pert, MD [facility]  12/01/2020, 7:50 AM

## 2020-12-01 NOTE — Progress Notes (Signed)
Patient ID: Cesar [name], male   DOB: [birth year], 87 y.o.   MRN: [id number] Vital signs are stable and speech remains poor. CT scan obtained yesterday demonstrates that there is good decompression further resolution of the shift. I have discussed with his wife Cesar [name] that it will take some time for speech to improve but there is no reason that it should not given the fact that we do not see any evidence of any acute strokes.  These things can take some time and I believe that Cesar [name] would benefit best from comprehensive inpatient rehabilitation.

## 2020-12-02 ENCOUNTER — Encounter (HOSPITAL_COMMUNITY): Payer: Self-pay | Admitting: Physical Medicine & Rehabilitation

## 2020-12-02 ENCOUNTER — Other Ambulatory Visit: Payer: Self-pay

## 2020-12-02 ENCOUNTER — Inpatient Hospital Stay (HOSPITAL_COMMUNITY)
Admission: RE | Admit: 2020-12-02 | Discharge: 2020-12-19 | DRG: 945 | Disposition: A | Discharge status: Home Health | Payer: Medicare Other | Source: Intra-hospital | Attending: Physical Medicine & Rehabilitation | Admitting: Physical Medicine & Rehabilitation

## 2020-12-02 DIAGNOSIS — I739 Peripheral vascular disease, unspecified: Secondary | ICD-10-CM | POA: Diagnosis present

## 2020-12-02 DIAGNOSIS — L89322 Pressure ulcer of left buttock, stage 2: Secondary | ICD-10-CM | POA: Diagnosis not present

## 2020-12-02 DIAGNOSIS — N390 Urinary tract infection, site not specified: Secondary | ICD-10-CM | POA: Diagnosis not present

## 2020-12-02 DIAGNOSIS — D696 Thrombocytopenia, unspecified: Secondary | ICD-10-CM | POA: Diagnosis not present

## 2020-12-02 DIAGNOSIS — N183 Chronic kidney disease, stage 3 unspecified: Secondary | ICD-10-CM | POA: Diagnosis present

## 2020-12-02 DIAGNOSIS — Z7901 Long term (current) use of anticoagulants: Secondary | ICD-10-CM | POA: Diagnosis not present

## 2020-12-02 DIAGNOSIS — R4701 Aphasia: Secondary | ICD-10-CM | POA: Diagnosis present

## 2020-12-02 DIAGNOSIS — B9620 Unspecified Escherichia coli [E. coli] as the cause of diseases classified elsewhere: Secondary | ICD-10-CM | POA: Diagnosis not present

## 2020-12-02 DIAGNOSIS — R319 Hematuria, unspecified: Secondary | ICD-10-CM | POA: Diagnosis not present

## 2020-12-02 DIAGNOSIS — I2510 Atherosclerotic heart disease of native coronary artery without angina pectoris: Secondary | ICD-10-CM | POA: Diagnosis present

## 2020-12-02 DIAGNOSIS — F322 Major depressive disorder, single episode, severe without psychotic features: Secondary | ICD-10-CM | POA: Diagnosis not present

## 2020-12-02 DIAGNOSIS — Z8249 Family history of ischemic heart disease and other diseases of the circulatory system: Secondary | ICD-10-CM

## 2020-12-02 DIAGNOSIS — I712 Thoracic aortic aneurysm, without rupture: Secondary | ICD-10-CM | POA: Diagnosis present

## 2020-12-02 DIAGNOSIS — R296 Repeated falls: Secondary | ICD-10-CM | POA: Diagnosis present

## 2020-12-02 DIAGNOSIS — Z86718 Personal history of other venous thrombosis and embolism: Secondary | ICD-10-CM

## 2020-12-02 DIAGNOSIS — F32A Depression, unspecified: Secondary | ICD-10-CM | POA: Diagnosis present

## 2020-12-02 DIAGNOSIS — R1310 Dysphagia, unspecified: Secondary | ICD-10-CM | POA: Diagnosis not present

## 2020-12-02 DIAGNOSIS — I482 Chronic atrial fibrillation, unspecified: Secondary | ICD-10-CM

## 2020-12-02 DIAGNOSIS — R918 Other nonspecific abnormal finding of lung field: Secondary | ICD-10-CM | POA: Diagnosis present

## 2020-12-02 DIAGNOSIS — I4891 Unspecified atrial fibrillation: Secondary | ICD-10-CM | POA: Diagnosis not present

## 2020-12-02 DIAGNOSIS — S065X0S Traumatic subdural hemorrhage without loss of consciousness, sequela: Secondary | ICD-10-CM | POA: Diagnosis not present

## 2020-12-02 DIAGNOSIS — S065XAA Traumatic subdural hemorrhage with loss of consciousness status unknown, initial encounter: Secondary | ICD-10-CM | POA: Diagnosis present

## 2020-12-02 DIAGNOSIS — R471 Dysarthria and anarthria: Secondary | ICD-10-CM | POA: Diagnosis present

## 2020-12-02 DIAGNOSIS — G4733 Obstructive sleep apnea (adult) (pediatric): Secondary | ICD-10-CM | POA: Diagnosis present

## 2020-12-02 DIAGNOSIS — Z7982 Long term (current) use of aspirin: Secondary | ICD-10-CM

## 2020-12-02 DIAGNOSIS — N400 Enlarged prostate without lower urinary tract symptoms: Secondary | ICD-10-CM | POA: Diagnosis present

## 2020-12-02 DIAGNOSIS — G8191 Hemiplegia, unspecified affecting right dominant side: Secondary | ICD-10-CM | POA: Diagnosis present

## 2020-12-02 DIAGNOSIS — W19XXXD Unspecified fall, subsequent encounter: Secondary | ICD-10-CM | POA: Diagnosis present

## 2020-12-02 DIAGNOSIS — Z79899 Other long term (current) drug therapy: Secondary | ICD-10-CM

## 2020-12-02 DIAGNOSIS — R2689 Other abnormalities of gait and mobility: Secondary | ICD-10-CM | POA: Diagnosis not present

## 2020-12-02 DIAGNOSIS — I4821 Permanent atrial fibrillation: Secondary | ICD-10-CM | POA: Diagnosis present

## 2020-12-02 DIAGNOSIS — I639 Cerebral infarction, unspecified: Secondary | ICD-10-CM | POA: Diagnosis not present

## 2020-12-02 DIAGNOSIS — Z9181 History of falling: Secondary | ICD-10-CM

## 2020-12-02 DIAGNOSIS — Z8673 Personal history of transient ischemic attack (TIA), and cerebral infarction without residual deficits: Secondary | ICD-10-CM

## 2020-12-02 DIAGNOSIS — S065X0D Traumatic subdural hemorrhage without loss of consciousness, subsequent encounter: Principal | ICD-10-CM

## 2020-12-02 DIAGNOSIS — S065X0A Traumatic subdural hemorrhage without loss of consciousness, initial encounter: Secondary | ICD-10-CM

## 2020-12-02 DIAGNOSIS — Z888 Allergy status to other drugs, medicaments and biological substances status: Secondary | ICD-10-CM

## 2020-12-02 DIAGNOSIS — R1312 Dysphagia, oropharyngeal phase: Secondary | ICD-10-CM | POA: Diagnosis present

## 2020-12-02 DIAGNOSIS — H9190 Unspecified hearing loss, unspecified ear: Secondary | ICD-10-CM | POA: Diagnosis present

## 2020-12-02 DIAGNOSIS — Z952 Presence of prosthetic heart valve: Secondary | ICD-10-CM

## 2020-12-02 DIAGNOSIS — E785 Hyperlipidemia, unspecified: Secondary | ICD-10-CM | POA: Diagnosis present

## 2020-12-02 DIAGNOSIS — R569 Unspecified convulsions: Secondary | ICD-10-CM | POA: Diagnosis not present

## 2020-12-02 DIAGNOSIS — Z87891 Personal history of nicotine dependence: Secondary | ICD-10-CM

## 2020-12-02 DIAGNOSIS — G40909 Epilepsy, unspecified, not intractable, without status epilepticus: Secondary | ICD-10-CM | POA: Diagnosis present

## 2020-12-02 DIAGNOSIS — S065X9A Traumatic subdural hemorrhage with loss of consciousness of unspecified duration, initial encounter: Secondary | ICD-10-CM | POA: Diagnosis present

## 2020-12-02 DIAGNOSIS — E7800 Pure hypercholesterolemia, unspecified: Secondary | ICD-10-CM | POA: Diagnosis not present

## 2020-12-02 MED ORDER — ACETAMINOPHEN 650 MG RE SUPP: 650.0000 mg | Freq: Four times a day (QID) | RECTAL | Status: DC | PRN

## 2020-12-02 MED ORDER — DOCUSATE SODIUM 100 MG PO CAPS
100.0000 mg | ORAL_CAPSULE | Freq: Two times a day (BID) | ORAL | Status: DC
  Administered 2020-12-02 – 2020-12-19 (×34): 100 mg via ORAL
  Filled 2020-12-02 (×34): qty 1

## 2020-12-02 MED ORDER — PANTOPRAZOLE SODIUM 40 MG PO TBEC
40.0000 mg | DELAYED_RELEASE_TABLET | Freq: Every day | ORAL | Status: DC
  Administered 2020-12-02 – 2020-12-17 (×16): 40 mg via ORAL
  Filled 2020-12-02 (×17): qty 1

## 2020-12-02 MED ORDER — POLYETHYLENE GLYCOL 3350 17 G PO PACK
17.0000 g | PACK | Freq: Every day | ORAL | Status: DC | PRN
  Administered 2020-12-09 – 2020-12-10 (×2): 17 g via ORAL
  Filled 2020-12-02: qty 1

## 2020-12-02 MED ORDER — HEPARIN SODIUM (PORCINE) 5000 UNIT/ML IJ SOLN
5000.0000 [IU] | Freq: Two times a day (BID) | INTRAMUSCULAR | Status: DC
  Administered 2020-12-02 – 2020-12-06 (×8): 5000 [IU] via SUBCUTANEOUS
  Filled 2020-12-02 (×8): qty 1

## 2020-12-02 MED ORDER — ONDANSETRON HCL 4 MG/2ML IJ SOLN: 4.0000 mg | INTRAMUSCULAR | Status: DC | PRN

## 2020-12-02 MED ORDER — ACETAMINOPHEN 325 MG PO TABS: 650.0000 mg | ORAL_TABLET | Freq: Four times a day (QID) | ORAL | Status: DC | PRN

## 2020-12-02 MED ORDER — ONDANSETRON HCL 4 MG PO TABS: 4.0000 mg | ORAL_TABLET | ORAL | Status: DC | PRN

## 2020-12-02 MED ORDER — HEPARIN SODIUM (PORCINE) 5000 UNIT/ML IJ SOLN: 5000.0000 [IU] | Freq: Three times a day (TID) | INTRAMUSCULAR | Status: DC

## 2020-12-02 MED ORDER — FINASTERIDE 5 MG PO TABS
5.0000 mg | ORAL_TABLET | Freq: Every day | ORAL | Status: DC
  Administered 2020-12-03 – 2020-12-19 (×17): 5 mg via ORAL
  Filled 2020-12-02 (×17): qty 1

## 2020-12-02 MED ORDER — DIVALPROEX SODIUM 250 MG PO DR TAB
250.0000 mg | DELAYED_RELEASE_TABLET | Freq: Four times a day (QID) | ORAL | Status: DC
  Administered 2020-12-02 – 2020-12-12 (×40): 250 mg via ORAL
  Filled 2020-12-02 (×44): qty 1

## 2020-12-02 NOTE — Progress Notes (Signed)
  Speech Language Pathology Treatment: Cognitive-Linquistic;Dysphagia  Patient Details Name: Cesar [name] MRN: [id number] DOB: 1929-05-03 Today's Date: 12/02/2020 Time: 1206-1225 SLP Time Calculation (min) (ACUTE ONLY): 19 min  Assessment / Plan / Recommendation Clinical Impression  Cesar [name] in improving and starting to move closer to baseline of when therapist initially evaluated pt. Verbalizations/language have increased/improved although his speech is now mod-severely dysarthric.  Peaches of graham cracker consumed without pocketing and timely transit. Volume and pace was appropriate as he was impulsive last week Given his mentation and alertness have improved, will upgrade him to Dys 3, continue thin liquids, pills in puree and full supervision to continue on CIR (transferring today).    HPI HPI: 87 y.o. male with medical history significant of permanent A. fib, CKD stage III, CVA, seizure disorder, depression, history of DVT, hyperlipidemia, OSA not on CPAP, AAA, ascending thoracic aortic aneurysm, severe aortic stenosis status post TAVR 10/22/2020, nonobstructive CAD presenting to the ED via EMS for evaluation of weakness and multiple falls. Head CT showing acute on chronic left cerebral convexity subdural hematoma. Pt s/p L sided burr hole x2 and placement of subdural drain for evacuation of SDH 11/21/20. On 2/20 pt exhibited aphasia, dysarthria and increased weakness on right. MMRI negative for changes. On continuous EEG.      SLP Plan  Other (Comment) (transferring to CIR today)       Recommendations  Diet recommendations: Dysphagia 3 (mechanical soft);Thin liquid Liquids provided via: Cup;Straw Medication Administration: Whole meds with puree Supervision: Staff to assist with self feeding;Full supervision/cueing for compensatory strategies Compensations: Slow rate;Lingual sweep for clearance of pocketing;Small sips/bites Postural Changes and/or Swallow Maneuvers: Seated upright  90 degrees                Oral Care Recommendations: Oral care BID Follow up Recommendations: Inpatient Rehab SLP Visit Diagnosis: Dysphagia, oropharyngeal phase (R13.12);Cognitive communication deficit (R41.841);Dysarthria and anarthria (R47.1) Plan: Other (Comment) (transferring to CIR today)       GO                Cesar [name] 12/02/2020, 12:46 PM

## 2020-12-02 NOTE — Progress Notes (Signed)
Inpatient Rehab Admissions Coordinator:  There is a bed available for pt to admit to CIR today.  Dr. [name] aware and in agreement.  NSG, TOC, pt and wife made aware.     Gayland Curry, [place], [place] Admissions Coordinator [phone]

## 2020-12-02 NOTE — Progress Notes (Signed)
Signed     Expand All Collapse All             Physical Medicine and Rehabilitation Consult Reason for Consult: Weakness altered mental status with multiple falls Referring Physician: [facility]   HPI: Cesar [name] is a 87 y.o. right-handed male with history of atrial fibrillation on Eliquis, CKD stage III, CVA, seizure disorder, history of DVT, hyperlipidemia, OSA not on CPAP, ascending thoracic aortic aneurysm followed by Dr. Caffie Pinto, severe aortic stenosis status post TAVR 10/22/2020, nonobstructive CAD.  History taken from chart review and daughter due to [facility].  Patient lives with spouse.  Two-level home bed and bath main level 2 steps to entry.  Independent with assistive device.  He presented on 11/21/2020 after increasing weakness with increasing falls and AMS.  Cranial CT scan showed large chronic left SDH.  Patient underwent left-sided bur hole x2 placement of subdural drain for evacuation of subdural hematoma on 11/21/2020 per Dr. Duffy Rhody.  Presently maintained on both Depakote as well as Dilantin for seizures.  Repeat head CT personally reviewed, showing improvement in SDH (status post decompression).  Tolerating a regular consistency diet.  Cardene drip initiated for blood pressure control.  His chronic Eliquis currently remains on hold x6 weeks.  Therapy evaluations completed recommendations of physical medicine rehab consult due to altered mental status and multiple falls.   Review of Systems  Unable to perform ROS: Other  Severe HOH       Past Medical History:  Diagnosis Date  . AAA (abdominal aortic aneurysm) ([place])    . CKD (chronic kidney disease), stage III (HCC)      baseline Cr 1.4-1.5 in 2016-2017 by PCP)  . CVA (cerebral infarction)    . Depression    . DVT (deep venous thrombosis) ([place]) 2006  . H/O blood clots    . Hematuria    . HOH (hard of hearing)    . Hypercholesterolemia    . OSA (obstructive sleep apnea)      intol to CPAP  . Permanent atrial  fibrillation ([place])    . Prostate enlargement    . Pulmonary nodules    . 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 Buena Irish and Dr. Roxy Manns  . Thoracic ascending aortic aneurysm ([place])      4.5cm in 11/2018, stable since 2016  . TIA (transient ischemic attack)           Past Surgical History:  Procedure Laterality Date  . BURR HOLE Left 11/21/2020    Procedure: LEFT Side BURR HOLES,;  Surgeon: [name], MD;  Location: [place];  Service: Neurosurgery;  Laterality: Left;  . EMBOLECTOMY Left 07/02/2016    Procedure: EMBOLECTOMY LEFT FEMORAL ARTERY;  Surgeon: [name], MD;  Location: [place];  Service: Vascular;  Laterality: Left;  Marland Kitchen MESENTERIC ARTERY BYPASS N/A 08/06/2019    Procedure: SUPERIOR MESENTERIC ARTERY THROMBECTOMY;  Surgeon: [name], MD;  Location: [place];  Service: Vascular;  Laterality: N/A;  . NM [place]   08/24/2002    markedly positive,subtle lateral ischemia  . PATCH ANGIOPLASTY   08/06/2019    Procedure: Patch Angioplasty of superior messenteric artery with RIGHT femoral vein;  Surgeon: [name], MD;  Location: [place];  Service: Vascular;;  . PROSTATE SURGERY      . RIGHT HEART CATH AND CORONARY ANGIOGRAPHY N/A 10/21/2020  Procedure: RIGHT HEART CATH AND CORONARY ANGIOGRAPHY;  Surgeon: [name], MD;  Location: [place] CV [facility];  Service: Cardiovascular;  Laterality: N/A;  . TEE WITHOUT CARDIOVERSION N/A 10/22/2020    Procedure: TRANSESOPHAGEAL ECHOCARDIOGRAM (TEE);  Surgeon: [name], MD;  Location: [place];  Service: Open Heart Surgery;  Laterality: N/A;  . TRANSCATHETER AORTIC VALVE REPLACEMENT, TRANSFEMORAL Right 10/22/2020    Procedure: TRANSCATHETER AORTIC VALVE REPLACEMENT, TRANSFEMORAL;  Surgeon: [name], MD;  Location: [place];  Service: Open Heart Surgery;  Laterality: Right;  . US ECHOCARDIOGRAPHY    01/06/2012    mod. AOV ca+,mild to mod. AS,mod mostly posterior MAC-mild MR         Family History  Problem Relation Age of Onset  . Heart failure Mother    . Diabetes Father    . Stroke Father      Social History:  reports that he quit smoking about 68 years ago. His smoking use included cigarettes. He has a 10.00 pack-year smoking history. He has never used smokeless tobacco. He reports previous alcohol use. He reports that he does not use drugs. Allergies:       Allergies  Allergen Reactions  . Flomax [Tamsulosin Hcl] Other (See Comments)      Not able to urinate  . Levetiracetam Other (See Comments)      Unknown reaction          Medications Prior to Admission  Medication Sig Dispense Refill  . acetaminophen (TYLENOL) 500 MG tablet Take 500 mg by mouth every 8 (eight) hours as needed for mild pain.      Marland Kitchen amoxicillin (AMOXIL) 500 MG tablet Take 4 tablets (2000 mg) one hour prior to all dental visits. 8 tablet 11  . apixaban (ELIQUIS) 5 MG TABS tablet Take 5 mg by mouth 2 (two) times daily.      Marland Kitchen aspirin EC 81 MG tablet Take 81 mg by mouth daily. Swallow whole.      Marland Kitchen atorvastatin (LIPITOR) 80 MG tablet Take 80 mg by mouth daily.      Marland Kitchen b complex vitamins capsule Take 1 capsule by mouth daily.      . divalproex (DEPAKOTE ER) 500 MG 24 hr tablet Take 1 tablet (500 mg total) by mouth at bedtime. 30 tablet 11  . finasteride (PROSCAR) 5 MG tablet Take 5 mg by mouth daily.      . Rivaroxaban (XARELTO) 15 MG TABS tablet Take 1 tablet (15 mg total) by mouth daily with supper. (Patient not taking: No sig reported) 90 tablet 3      Home: Home Living Family/patient expects to be discharged to:: Private residence Living Arrangements: Spouse/significant other Available Help at Discharge: Family,Available 24 hours/day Type of Home: House Home Access: Stairs to enter [employer] of Steps: 2 Entrance Stairs-Rails: Right Home Layout: Two level,Able to live on main level with  bedroom/bathroom Bathroom Shower/Tub: Multimedia programmer: Standard Home Equipment: Environmental consultant - 2 wheels,Bedside commode,Cane - single point Additional Comments: home setup obtained from PT evaluation  Functional History: Prior Function Level of Independence: Independent with assistive device(s) Comments: pt reports that he has 12 acres of land and likes riding his machinery on the land. 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[place], [place] with RW Functional Status:  Mobility: Bed Mobility Overal bed mobility: Needs Assistance Bed Mobility: Rolling,Supine to Sit Rolling: Min guard Supine to sit: Min assist,HOB elevated General bed mobility comments: hOB ~35 degrees, pt with  decreased awareness to R LE and progressing toward EOB with bil UE. Pt has lack of awarness that R LE is flexed and stuck against the footboard even with verbal cues. Therapy has to lift R LE to help him initiate and progress R side toward EOB. Pt reports bil LE on floor when only LLE touching. Pt unaware R LE was not touching Transfers Overall transfer level: Needs assistance Equipment used: 2 person hand held assist Transfers: Sit to/from [employer] Sit to Stand: +2 physical assistance,Min assist Stand pivot transfers: +2 physical assistance,Min assist General transfer comment: pt reaching with L UE toward the chair and holding PT hand R UE. pt needed cues to reach with L UE toward OT to help upright posture for transfer. Pt terminates with uncontrolled descend to chair Ambulation/Gait Ambulation/Gait assistance: Mod assist,+2 physical assistance Gait Distance (Feet): 4 Feet Assistive device: 2 person hand held assist Gait Pattern/deviations: Step-to pattern,Decreased step length - right,Decreased weight shift to left,Decreased dorsiflexion - right General Gait Details: pt with decreased step clearance and increased assist needed to maintain balance while stepping.  inconsistent step placement, but limited this session due to MD verbal order to transfer to recliner only Gait velocity: reduced Gait velocity interpretation: <1.31 ft/sec, indicative of household ambulator   ADL: ADL Overall ADL's : Needs assistance/impaired Eating/Feeding: Set up,Bed level Eating/Feeding Details (indicate cue type and reason): noted to have food in the bed with him due to spillage. pt after getting up states "who put those potatos in my bed?" Grooming: Sitting,Set up,Min guard Grooming Details (indicate cue type and reason): may need cues for inititation and sequencing? Upper Body Bathing: Minimal assistance,Sitting Lower Body Bathing: Moderate assistance,Sit to/from stand Upper Body Dressing : Set up,Min guard,Minimal assistance,Sitting Lower Body Dressing: Moderate assistance,Bed level Lower Body Dressing Details (indicate cue type and reason): pt is unable to open sock and even with hooked sock over toes unable to pull up with hands. pt able to reach but not able to pull up due to fine motor challenges Toilet Transfer: +2 for physical assistance,Minimal assistance,Stand-pivot Toilet Transfer Details (indicate cue type and reason): simulated bed to chair Toileting- Clothing Manipulation and Hygiene: Moderate assistance,Sit to/from stand Functional mobility during ADLs: Minimal assistance,Rolling walker,Moderate assistance,Cueing for sequencing General ADL Comments: pt only allowed oOB to chair this session. pt demonstrates decreased awareness to R side   Cognition: Cognition Overall Cognitive Status: Difficult to assess Orientation Level: Oriented to person,Oriented to place,Disoriented to situation,Disoriented to time Cognition Arousal/Alertness: Awake/alert Behavior During Therapy: Flat affect Overall Cognitive Status: Difficult to assess Area of Impairment: Safety/judgement,Memory Memory: Decreased short-term memory Safety/Judgement: Decreased awareness of  safety,Decreased awareness of deficits General Comments: pt asking what time it was on arrival. Pt able to read OT watch in 14 font size. pt transferring to the chair and asking the same questions again. Pt with poor recall of asking. Pt slightly impulsive due to [facility] and attempting to anticipate therapy needs. pt states "you want me to get up" and attempting because he was sitting EOB. pt can read very well so written communication is key for his understanding until hearing aides charged Difficult to assess due to: Hard of hearing/deaf   Blood pressure (!) 135/119, pulse 75, temperature 98.4 F (36.9 C), temperature source Oral, resp. rate 18, SpO2 99 %. Physical Exam Vitals reviewed.  Constitutional:      General: He is not in acute distress.    Appearance: He is not ill-appearing.  HENT:  Head:     Comments: Left bur hole site clean and dry with drain.    Right Ear: External ear normal.     Left Ear: External ear normal.     Nose: Nose normal.  Eyes:     General:        Right eye: No discharge.        Left eye: No discharge.     Extraocular Movements: Extraocular movements intact.  Cardiovascular:     Comments: Irregularly irregular Pulmonary:     Effort: Pulmonary effort is normal. No respiratory distress.     Breath sounds: No stridor.  Abdominal:     General: Abdomen is flat. Bowel sounds are normal. There is no distension.  Musculoskeletal:     Cervical back: Normal range of motion and neck supple.     Comments: No edema or tenderness in extremities  Skin:    Comments: Left scalp with dressing CDI  Neurological:     Mental Status: He is alert.     Comments: Alert Makes eye contact with examiner.   He does have a right side drift. Very HOH Motor: Appears to be 5/5 throughout  Psychiatric:     Comments: ?  Slowed, limited due to [facility]        Lab Results Last 24 Hours       Results for orders placed or performed during the hospital encounter of 11/19/20 (from the  past 24 hour(s))  Glucose, capillary     Status: Abnormal    Collection Time: 11/21/20  7:48 PM  Result Value Ref Range    Glucose-Capillary 123 (H) 70 - 99 mg/dL       Imaging Results (Last 48 hours)  CT HEAD WO CONTRAST   Result Date: 11/22/2020 CLINICAL DATA:  Follow-up subdural hematoma EXAM: CT HEAD WITHOUT CONTRAST TECHNIQUE: Contiguous axial images were obtained from the base of the skull through the vertex without intravenous contrast. COMPARISON:  Two days ago FINDINGS: Brain: Interval drainage of subdural hematoma along the left frontal convexity. Drain is present along with remaining fluid and gas. Maximal subdural thickness is 15 mm the midline shift is essentially resolved in the setting of atrophy Remote right frontal infarct. Chronic small vessel ischemia in the cerebral white matter. No hydrocephalus. Vascular: No hyperdense vessel or unexpected calcification. Skull: Left-sided burr holes for drainage. Sinuses/Orbits: No acute finding IMPRESSION: Decompressed subdural hematoma on the left essentially resolved midline shift. Electronically Signed   By: Monte Fantasia M.D.   On: 11/22/2020 06:05       Assessment/Plan: Diagnosis: Chronic left SDH             [place] score:  >/VI             Speech to evaluate for Post traumatic amnesia and interval GOAT scores to assess progress.             NeuroPsych evaluation for behavorial assessment.             Provide environmental management by reducing the level of stimulation, tolerating restlessness when possible, protecting patient from harming self or others and reducing patient's cognitive confusion.             Address behavioral concerns include providing structured environments and daily routines.             Cognitive therapy to direct modular abilities in order to maintain goals        including problem solving, self regulation/monitoring,  self management, attention, and memory.             Fall precautions; pt at  risk for second impact syndrome             Prevention of secondary injury: monitor for hypotension, hypoxia, seizures or signs of increased ICP             AED:              PT/OT consults for mobility strengthening, endurance training and adaptive ADLs              Avoid medications that could impair cognitive abilities, such as anticholinergics, antihistaminic, benzodiazapines, narcotics, etc when possible Labs and images (see above) independently reviewed.  Records reviewed and summated above.   1. Does the need for close, 24 hr/day medical supervision in concert with the patient's rehab needs make it unreasonable for this patient to be served in a less intensive setting? Potentially  2. Co-Morbidities requiring supervision/potential complications: atrial fibrillation (Eliquis on hold, monitor heart rate with increased activity), CKD stage III (avoid nephrotoxic meds, repeat labs), CVA, seizure disorder (continue medication), history of DVT (restart Eliquis after 6 weeks), hyperlipidemia, OSA not on CPAP (monitor for daytime somnolence and headaches), ascending thoracic aortic aneurysm, severe aortic stenosis status post TAVR, ABLA (repeat labs, consider transfusion if necessary to ensure appropriate perfusion for increased activity tolerance), Thrombocytopenia (< 60,000/mm3 no resistive exercise), BPH (finasteride) 3. Due to bladder management, bowel management, safety, skin/wound care, disease management, medication administration, pain management and patient education, does the patient require 24 hr/day rehab nursing? Yes 4. Does the patient require coordinated care of a physician, rehab nurse, therapy disciplines of PT/OT/SLP to address physical and functional deficits in the context of the above medical diagnosis(es)? Potentially Addressing deficits in the following areas: balance, endurance, locomotion, strength, transferring, bathing, dressing, toileting, cognition and psychosocial  support 5. Can the patient actively participate in an intensive therapy program of at least 3 hrs of therapy per day at least 5 days per week? Yes 6. The potential for patient to make measurable gains while on inpatient rehab is excellent 7. Anticipated functional outcomes upon discharge from inpatient rehab are supervision and min assist  with PT, supervision and min assist with OT, modified independent and supervision with SLP. 8. Estimated rehab length of stay to reach the above functional goals is: 8-12 days. 9. Anticipated discharge destination: Home 10. Overall Rehab/Functional Prognosis: good   RECOMMENDATIONS: This patient's condition is appropriate for continued rehabilitative care in the following setting: Based on initial evaluation, suspect patient will progress relatively quickly to at/near baseline level of functioning.  Patient required some assistance with multiple falls PTA.  If patient does not continue to progress, would recommend CIR if adequate caregiver support available upon discharge. Patient has agreed to participate in recommended program. Potentially Note that insurance prior authorization may be required for reimbursement for recommended care.   Comment: Rehab Admissions Coordinator to follow up.   I have personally performed a face to face diagnostic evaluation, including, but not limited to relevant history and physical exam findings, of this patient and developed relevant assessment and plan.  Additionally, I have reviewed and concur with the physician assistant's documentation above.    Delice Lesch, MD, ABPMR Lavon Paganini Angiulli, PA-C 11/22/2020

## 2020-12-02 NOTE — Progress Notes (Signed)
Inpatient Rehabilitation Medication Review by a Pharmacist  A complete drug regimen review was completed for this patient to identify any potential clinically significant medication issues.  Clinically significant medication issues were identified:  yes   Type of Medication Issue Identified Description of Issue Urgent (address now) Non-Urgent (address on AM team rounds) Plan Plan Accepted by Provider? (Yes / No / Pending AM Rounds)  Drug Interaction(s) (clinically significant)       Duplicate Therapy  Pt ordered SQ heparin q8h and q12h. Pt was on q12h heparin while in hospital due to recent neurosurgery.  Urgent Spoke with D. Golden, PA who okayed continuing with q12h SQ heparin Yes  Allergy       No Medication Administration End Date       Incorrect Dose       Additional Drug Therapy Needed  Per Neurosurgery note, ASA to restart 3/4 (7 days from 2/25) Non-urgent Will f/u restart of Aspirin on 3/4 Pending AM rounds  Other  - B-complex vitamins not restarted from PTA meds (not clinically significant medication so okay to start at d/c from rehab) - Per neurosurgery note, possibly restart anticoagulation (apixaban PTA) in 4-6 weeks. This will likely be done as outpatient - current rehab LOS only 20 days. Non-urgent No action needed     Name of provider notified for urgent issues identified: Marlowe Shores, PA  Provider Method of Notification: Phone call   For non-urgent medication issues to be resolved on team rounds tomorrow morning a [facility] Secure Chat Handoff was sent to:    Pharmacist comments:   Time spent performing this drug regimen review (minutes):  20 min   Sherlon Handing, PharmD, BCPS Please see amion for complete clinical pharmacist phone list 12/02/2020 4:48 PM

## 2020-12-02 NOTE — Progress Notes (Signed)
PROGRESS NOTE    Cesar [name]  YIR:[id number] DOB: [birth year] DOA: 11/19/2020 PCP: Orpah Melter, MD   Chief Complaint  Patient presents with  . Weakness   Brief Narrative: 87 year old male with multiple complex comorbidities permanent A. fib on Eliquis, CKD stage III, CVA, seizure disorder, depression, hyperlipidemia, OSA not on CPAP, AAA/ascending thoracic aortic aneurysm followed by Dr. 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 Patient with prolonged hospitalization has had decreased level of consciousness on 2/21 brain imaging EEG were done no acute finding on imaging in no seizure EEG underwent long-term EEG as well. Has had similar episode in the past suspected to be focal seizure, already started on 500 mg Depakote nightly which he was taking regularly.  No evidence of UTI on UA. 2/26 patient underwent repeat CT head that shows decreased conspicuity of left cerebral convexity subdural collection and tiny foci of pneumocephalus, 2 to 3 mm rightward midline shift  Subjective: Seen and examined. Wife at the bedside-reports his speech is improving, patient still has dysarthric speech.  Assessment & Plan:  SDH: s/p burr hole x2 on 2/17.  Continue PT OT, awaiting for CIR placement.  Can restart ASA in 1 week possible anticoagulation ~ 4-6 wk per Neurosurgery.  Had repeat CT head yesterday with decreased subdural collection and 2 to 3 mm rightward midline shift.  No further surgical plan pending placement to CIR.  Decreased level of consciousness unclear etiology:noted to have level of consciousness on 2/21- s/p brain imaging EEG-no acute finding on imaging in no seizure EEG underwent long-term EEG as  well. Has had similar episode in the past suspected to be focal seizure, already started on 500 mg Depakote nightly which he was taking regularly-but level was low dose increased.No evidence of UTI on UA, culture no growth so far.  He is alert awake oriented communicative interactive.  Okay for CIR once bed available.   CKD stage IIIa w/ [place] metabolic acidosis, bicarb improved to 25.  BUN/creatinine trend  Baseline ~1.3-1.6.  Renal function remains stable at baseline.   Recent Labs    10/22/20 0937 10/22/20 1017 10/23/20 0009 10/23/20 0150 10/30/20 1520 11/19/20 1751 11/19/20 1818 11/25/20 1232 11/28/20 0314 12/01/20 0610  BUN 31* 29* 34* 37* 28* 20 24* 33* 43* 38*  CREATININE 1.40* 1.30* 1.66* 1.83* 1.28* 1.34* 1.40* 1.54* 1.39* 1.50*   A. fib permanent/history of CVA with right-sided weakness: DOAC held due to SDH.  Aspirin also on hold.  Refer to #1 Severe AS s/p TAVR: Euvolemic.  Holding DAOC/ASA as above. BPH cont Proscar Severe hearing loss, supportive measures Generalized weakness/debility/deconditioning awaiting for CIR placement.   History of DVT-continue DVT prophylaxis with heparin OSA not on CPAP  Nutrition: Speech following Diet Order            DIET - DYS 1 Room service appropriate? No; Fluid consistency: Thin  Diet effective now                 DVT prophylaxis: heparin injection 5,000 Units Start: 11/26/20 0800 SCDs Start: 11/21/20 1939 Code Status:   Code Status: Full Code  Family Communication: plan of care discussed with patient and wife at the bedside and nursing staff.  Status is: Inpatient Remains  inpatient appropriate because:Inpatient level of care appropriate due to severity of illness  Dispo: The patient is from: Home              Anticipated d/c is to: CIR once bed available -defer to primary team              Patient currently is medically stable   Difficult to place patient No  Consultants:see note  Procedures:see note  Unresulted  Labs (From admission, onward)          Start     Ordered   Signed and Held  CBC  (heparin)  Once,   R       Comments: Baseline for heparin therapy IF NOT ALREADY DRAWN.  Notify MD if PLT < 100 K.   Question:  Specimen collection method  Answer:  Lab=Lab collect   Signed and Held   Signed and Held  Creatinine, serum  (heparin)  Once,   R       Comments: Baseline for heparin therapy IF NOT ALREADY DRAWN.   Question:  Specimen collection method  Answer:  Lab=Lab collect   Signed and Held   Signed and Held  Comprehensive metabolic panel  Tomorrow morning,   R       Question:  Specimen collection method  Answer:  Lab=Lab collect   Signed and Held   Signed and Held  CBC WITH DIFFERENTIAL  Tomorrow morning,   R       Question:  Specimen collection method  Answer:  Lab=Lab collect   Signed and Held          Culture/Microbiology No results found for: SDES, SPECREQUEST, CULT, REPTSTATUS  Other culture-see note  Medications: Scheduled Meds: . chlorhexidine  15 mL Mouth/Throat Once   Or  . mouth rinse  15 mL Mouth Rinse Once  . divalproex  250 mg Oral Q6H  . docusate sodium  100 mg Oral BID  . finasteride  5 mg Oral Daily  . heparin  5,000 Units Subcutaneous Q12H  . pantoprazole  40 mg Oral Q supper   Continuous Infusions: . sodium chloride 10 mL/hr at 11/26/20 1400  . niCARDipine      Antimicrobials: Anti-infectives (From admission, onward)   Start     Dose/Rate Route Frequency Ordered Stop   11/25/20 2200  sulfamethoxazole-trimethoprim (BACTRIM) 160 mg in dextrose 5 % 250 mL IVPB  Status:  Discontinued        160 mg 260 mL/hr over 60 Minutes Intravenous Every 12 hours 11/25/20 2111 11/25/20 2116   11/25/20 1500  sulfamethoxazole-trimethoprim (BACTRIM DS) 800-160 MG per tablet 1 tablet  Status:  Discontinued        1 tablet Oral Every 12 hours 11/25/20 1409 11/25/20 2116   11/21/20 2330  ceFAZolin (ANCEF) IVPB 1 g/50 mL premix        1 g 100 mL/hr over 30 Minutes  Intravenous Every 8 hours 11/21/20 1938 11/23/20 1607     Objective: Vitals: Today's Vitals   12/02/20 0001 12/02/20 0500 12/02/20 0759 12/02/20 0800  BP: 131/72 (!) 144/75 136/72   Pulse: 62 64 65   Resp: (!) 22 (!) 22 20   Temp: 98.7 F (37.1 C) 98.5 F (36.9 C) 98.3 F (36.8 C)   TempSrc: Oral Oral    SpO2: 98% 98% 98%   Weight:      Height:      PainSc: 0-No pain   0-No pain    Intake/Output Summary (Last 24  hours) at 12/02/2020 1026 Last data filed at 12/02/2020 0800 Gross per 24 hour  Intake 720 ml  Output 1651 ml  Net -931 ml   Filed Weights   12/01/20 1519  Weight: 89.4 kg   Weight change:   Intake/Output from previous day: 02/27 0701 - 02/28 0700 In: 840 [P.O.:840] Out: 1451 [Urine:1450; Stool:1] Intake/Output this shift: Total I/O In: 360 [P.O.:360] Out: 200 [Urine:200] Filed Weights   12/01/20 1519  Weight: 89.4 kg    Examination: General exam: AAO, NAD, weak appearing. HEENT:Oral mucosa moist, Ear/Nose WNL grossly, dentition normal. Respiratory system: bilaterally clear,no wheezing or crackles,no use of accessory muscle Cardiovascular system: S1 & S2 +, No JVD,. Gastrointestinal system: Abdomen soft, NT,ND, BS+ Nervous System:Alert, awake, moving extremities and grossly nonfocal Extremities: No edema, distal peripheral pulses palpable.  Skin: No rashes,no icterus. MSK: Normal muscle bulk,tone, power Staple present on the scalp incision  Data Reviewed: I have personally reviewed following labs and imaging studies CBC: Recent Labs  Lab 11/25/20 1232 11/28/20 0314 12/01/20 0610  WBC 6.4 6.9 6.7  NEUTROABS 4.5  --   --   HGB 12.9* 11.6* 11.8*  HCT 40.4 33.4* 36.8*  MCV 98.3 95.4 98.9  PLT 132* 126* 409*   Basic Metabolic Panel: Recent Labs  Lab 11/25/20 1232 11/28/20 0314 12/01/20 0610  NA 135 138 141  K 4.4 4.5 4.6  CL 99 106 106  CO2 27 20* 25  GLUCOSE 127* 127* 102*  BUN 33* 43* 38*  CREATININE 1.54* 1.39* 1.50*  CALCIUM  8.8* 8.6* 8.8*   GFR: Estimated Creatinine Clearance: 37.7 mL/min (A) (by C-G formula based on SCr of 1.5 mg/dL (H)). Liver Function Tests: Recent Labs  Lab 12/01/20 0610  AST 23  ALT 15  ALKPHOS 50  BILITOT 0.7  PROT 5.7*  ALBUMIN 2.7*   No results for input(s): LIPASE, AMYLASE in the last 168 hours. No results for input(s): AMMONIA in the last 168 hours. Coagulation Profile: No results for input(s): INR, PROTIME in the last 168 hours. Cardiac Enzymes: No results for input(s): CKTOTAL, CKMB, CKMBINDEX, TROPONINI in the last 168 hours. BNP (last 3 results) No results for input(s): PROBNP in the last 8760 hours. HbA1C: No results for input(s): HGBA1C in the last 72 hours. CBG: No results for input(s): GLUCAP in the last 168 hours. Lipid Profile: No results for input(s): CHOL, HDL, LDLCALC, TRIG, CHOLHDL, LDLDIRECT in the last 72 hours. Thyroid Function Tests: No results for input(s): TSH, T4TOTAL, FREET4, T3FREE, THYROIDAB in the last 72 hours. Anemia Panel: No results for input(s): VITAMINB12, FOLATE, FERRITIN, TIBC, IRON, RETICCTPCT in the last 72 hours. Sepsis Labs: No results for input(s): PROCALCITON, LATICACIDVEN in the last 168 hours.  No results found for this or any previous visit (from the past 240 hour(s)).   Radiology Studies: CT HEAD WO CONTRAST  Result Date: 11/30/2020 CLINICAL DATA:  Subdural hemorrhage, follow up EXAM: CT HEAD WITHOUT CONTRAST TECHNIQUE: Contiguous axial images were obtained from the base of the skull through the vertex without intravenous contrast. COMPARISON:  11/26/2020 and prior. FINDINGS: Brain: Residual left cerebral convexity subdural collection and tiny foci of pneumocephalus are slightly less conspicuous than prior exam. Chronic bilateral frontal insults. No ventriculomegaly. 2-3 mm rightward midline shift. No mass lesion. Mild cerebral atrophy with ex vacuo dilatation. Chronic microvascular ischemic changes. Vascular: No hyperdense  vessel or unexpected calcification. Skull: Postsurgical appearance of the left calvarium. Sinuses/Orbits: Normal orbits. Tiny ethmoid and left maxillary sinus mucous retention cyst. No  mastoid effusion. Other: None. IMPRESSION: Decreased conspicuity of left cerebral convexity subdural collection and tiny foci of pneumocephalus. Chronic bilateral frontal insults and microvascular ischemic changes. 2-3 mm rightward midline shift. Electronically Signed   By: Primitivo Gauze M.D.   On: 11/30/2020 14:38     LOS: 12 days   Antonieta Pert, MD [facility]  12/02/2020, 10:26 AM

## 2020-12-02 NOTE — Plan of Care (Signed)

## 2020-12-02 NOTE — Progress Notes (Signed)
Neurosurgery   Pt seen and examined. Incision c/d Awake, alert, speech dysarthric and slow, but says name, Month, enunciates "when can I get out of here?" Hard of hearing No pronator drift  - awaiting CIR -staples out tomorrow

## 2020-12-02 NOTE — Progress Notes (Signed)
Patient arrived on unit, oriented to unit. Reviewed medications, therapy schedule, rehab routine and plan of care. States an understanding of information reviewed. No complications noted at this time. Patient reports no pain Cesar [name] Cesar [name]  

## 2020-12-02 NOTE — H&P (Signed)
Physical Medicine and Rehabilitation Admission H&P    Chief Complaint  Patient presents with  . Weakness  : HPI: Cesar [name] is an 87 year old right-handed male with history of atrial fibrillation maintained on Eliquis, CKD stage III, CVA, seizure disorder maintained on Depakote, history of DVT, hyperlipidemia, OSA not on CPAP, ascending thoracic aortic aneurysm followed by Dr. Caffie Pinto, severe aortic stenosis status post TAVR procedure 10/22/2020, nonobstructive CAD.  Per chart review lives with spouse.  Two-level home bed and bath main level 2 steps to entry.  Independent with assistive device.  He does have a daughter with good support.  Presented 11/19/2020 after increasing weakness with frequent falls and altered mental status.  Admission chemistries unremarkable except glucose 123, creatinine 1.34, hemoglobin 12.3, valproic acid level 26.  Cranial CT scan showed large chronic left subdural hematoma.  Patient underwent left-sided bur hole x2 placement of subdural drain for evacuation of subdural hematoma 11/21/2020 per Dr. Duffy Rhody.  He remains on Depakote as prior to admission   Follow-up cranial CT scan 11/22/2020 showed decompressed subdural hematoma on the left essentially resolved.  He was cleared to begin subcutaneous heparin for DVT prophylaxis 11/25/2020.  His chronic Eliquis remained on hold for approximately 6 weeks secondary to SDH.  Close monitoring of blood pressure initially on Cardene drip which is since been discontinued.  On 11/26/2020 patient with increasing right side weakness and aphasia.  Neurology consulted with MRI/MRA completed showing unchanged thickness of left convexity subdural hematoma.  No acute ischemia.  MRA unremarkable.  Old infarcts of the right frontal lobe and left caudate head.  Chronic small vessel ischemic changes of the white matter.  His cranial CT scan was completed for follow-up 11/30/2020 showing decreased conspicuity of left cerebral convexity  subdural collection and tiny foci of pneumocephalus.  2-3 mm rightward midline shift.  EEG completed showing no seizure activity however suggestive of cortical dysfunction left hemisphere likely secondary to underlying hematoma as well as moderate diffuse encephalopathy.  Currently maintained on a dysphagia #1 thin liquid diet..  Therapy evaluations completed due to patient's altered mental status decreased functional mobility he was admitted for a comprehensive rehab program.  Review of Systems  Constitutional: Negative for chills and fever.  HENT: Positive for hearing loss.   Eyes: Negative for blurred vision and double vision.  Respiratory: Positive for shortness of breath.   Cardiovascular: Positive for palpitations and leg swelling. Negative for chest pain.  Gastrointestinal: Positive for constipation. Negative for heartburn, nausea and vomiting.  Genitourinary: Positive for hematuria and urgency. Negative for dysuria and flank pain.  Musculoskeletal: Positive for falls, joint pain and myalgias.  Skin: Negative for rash.  Neurological: Positive for weakness.  Psychiatric/Behavioral: Positive for depression.  All other systems reviewed and are negative.  Past Medical History:  Diagnosis Date  . AAA (abdominal aortic aneurysm) ([facility] [facility])   . CKD (chronic kidney disease), stage III ([facility])    baseline Cr 1.4-1.5 in 2016-2017 by PCP)  . CVA (cerebral infarction)   . Depression   . DVT (deep venous thrombosis) ([facility]) 2006  . H/O blood clots   . Hematuria   . HOH (hard of hearing)   . Hypercholesterolemia   . OSA (obstructive sleep apnea)    intol to CPAP  . Permanent atrial fibrillation ([facility] [facility])   . Prostate enlargement   . Pulmonary nodules   . 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 Buena Irish and Dr. Roxy Manns  . Thoracic ascending aortic aneurysm ([place])    4.5cm in 11/2018, stable since 2016  . TIA (transient  ischemic attack)    Past Surgical History:  Procedure Laterality Date  . BURR HOLE Left 11/21/2020   Procedure: LEFT Side BURR HOLES,;  Surgeon: [name], MD;  Location: [facility];  Service: Neurosurgery;  Laterality: Left;  . EMBOLECTOMY Left 07/02/2016   Procedure: EMBOLECTOMY LEFT FEMORAL ARTERY;  Surgeon: [name], MD;  Location: [facility];  Service: Vascular;  Laterality: Left;  Marland Kitchen MESENTERIC ARTERY BYPASS N/A 08/06/2019   Procedure: SUPERIOR MESENTERIC ARTERY THROMBECTOMY;  Surgeon: [name], MD;  Location: [facility];  Service: Vascular;  Laterality: N/A;  . NM [place]  08/24/2002   markedly positive,subtle lateral ischemia  . PATCH ANGIOPLASTY  08/06/2019   Procedure: Patch Angioplasty of superior messenteric artery with RIGHT femoral vein;  Surgeon: [name], MD;  Location: [facility];  Service: Vascular;;  . PROSTATE SURGERY    . RIGHT HEART CATH AND CORONARY ANGIOGRAPHY N/A 10/21/2020   Procedure: RIGHT HEART CATH AND CORONARY ANGIOGRAPHY;  Surgeon: [name], MD;  Location: [facility];  Service: Cardiovascular;  Laterality: N/A;  . TEE WITHOUT CARDIOVERSION N/A 10/22/2020   Procedure: TRANSESOPHAGEAL ECHOCARDIOGRAM (TEE);  Surgeon: [name], MD;  Location: [facility];  Service: Open Heart Surgery;  Laterality: N/A;  . TRANSCATHETER AORTIC VALVE REPLACEMENT, TRANSFEMORAL Right 10/22/2020   Procedure: TRANSCATHETER AORTIC VALVE REPLACEMENT, TRANSFEMORAL;  Surgeon: [name], MD;  Location: [facility];  Service: Open Heart Surgery;  Laterality: Right;  . US ECHOCARDIOGRAPHY  01/06/2012   mod. AOV ca+,mild to mod. AS,mod mostly posterior MAC-mild MR   Family History  Problem Relation Age of Onset  . Heart failure Mother   . Diabetes Father   . Stroke Father    Social History:  reports that he quit smoking about 68 years ago. His smoking use included cigarettes. He has a 10.00 pack-year smoking history. He has never  used smokeless tobacco. He reports previous alcohol use. He reports that he does not use drugs. Allergies:  Allergies  Allergen Reactions  . Flomax [Tamsulosin Hcl] Other (See Comments)    Not able to urinate  . Levetiracetam Other (See Comments)    Unknown reaction   Medications Prior to Admission  Medication Sig Dispense Refill  . acetaminophen (TYLENOL) 500 MG tablet Take 500 mg by mouth every 8 (eight) hours as needed for mild pain.    Marland Kitchen amoxicillin (AMOXIL) 500 MG tablet Take 4 tablets (2000 mg) one hour prior to all dental visits. 8 tablet 11  . apixaban (ELIQUIS) 5 MG TABS tablet Take 5 mg by mouth 2 (two) times daily.    Marland Kitchen aspirin EC 81 MG tablet Take 81 mg by mouth daily. Swallow whole.    Marland Kitchen atorvastatin (LIPITOR) 80 MG tablet Take 80 mg by mouth daily.    Marland Kitchen b complex vitamins capsule Take 1 capsule by mouth daily.    . divalproex (DEPAKOTE ER) 500 MG 24 hr tablet Take 1 tablet (500 mg total) by mouth at bedtime. 30 tablet 11  . finasteride (PROSCAR) 5 MG tablet Take 5 mg by mouth daily.    . Rivaroxaban (XARELTO) 15 MG TABS tablet Take 1 tablet (15 mg total) by mouth daily with supper. (Patient not taking: No sig reported) 90 tablet 3    Drug Regimen Review Drug regimen was  reviewed and remains appropriate with no significant issues identified  Home: Home Living Family/patient expects to be discharged to:: Private residence Living Arrangements: Spouse/significant other Available Help at Discharge: Family,Available 24 hours/day Type of Home: House Home Access: Stairs to enter [employer] of Steps: 2 Entrance Stairs-Rails: Right Home Layout: Two level,Able to live on main level with bedroom/bathroom Bathroom Shower/Tub: Multimedia programmer: Standard Home Equipment: Environmental consultant - 2 wheels,Bedside commode,Cane - single point Additional Comments: home setup obtained from PT evaluation  Lives With: Spouse   Functional History: Prior Function Level of  Independence: Independent with assistive device(s) Comments: pt reports that he has 12 acres of land and likes riding his machinery on the land. 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[place], [place] with RW  Functional Status:  Mobility: Bed Mobility Overal bed mobility: Needs Assistance Bed Mobility: Supine to Sit Rolling: Max assist Supine to sit: Mod assist General bed mobility comments: oob to chair Transfers Overall transfer level: Needs assistance Equipment used: Rolling walker (2 wheeled) Transfers: Sit to/from [employer] Sit to Stand: Mod assist Stand pivot transfers: Mod assist General transfer comment: pt requires verbal and visual cues for hand placement. At baseline spouse holds RW and pt pulls up with walker, may be difficult habit to break with new onset neuro deficits Ambulation/Gait Ambulation/Gait assistance: Mod assist Gait Distance (Feet): 15 Feet (additional 10') Assistive device: Rolling walker (2 wheeled) Gait Pattern/deviations: Step-to pattern General Gait Details: pt with difficulty maintaining RUE on RW, often walker drifts to R side as only LUE is applying force to translate forward. This results in maintaining his base of support outside of RW with significant anterior and right lateral lean. PT provides physical assistance to manage RW Gait velocity: reduced Gait velocity interpretation: <1.31 ft/sec, indicative of household ambulator    ADL: ADL Overall ADL's : Needs assistance/impaired Eating/Feeding: Maximal assistance,Sitting Eating/Feeding Details (indicate cue type and reason): hand over hand without activation of wrist or hand noted. Pt does activate shoulder flexion extension adduction abduction, elbow flexion/ extension. pt initiates task with food presented. pt eating 100 % of tray Grooming: Wash/dry face,Brushing hair,Maximal assistance,Sitting Grooming Details (indicate cue type and reason): pt has less  initiation of grooming hair due to reaching outside visual attention Upper Body Bathing: Minimal assistance,Sitting Lower Body Bathing: Moderate assistance,Sit to/from stand Upper Body Dressing : Set up,Min guard,Minimal assistance,Sitting Lower Body Dressing: Moderate assistance,Bed level Lower Body Dressing Details (indicate cue type and reason): pt is unable to open sock and even with hooked sock over toes unable to pull up with hands. pt able to reach but not able to pull up due to fine motor challenges Toilet Transfer: +2 for physical assistance,Minimal assistance,Stand-pivot Toilet Transfer Details (indicate cue type and reason): simulated bed to chair Toileting- Clothing Manipulation and Hygiene: Moderate assistance,Sit to/from stand Functional mobility during ADLs: Minimal assistance,Rolling walker,Moderate assistance,Cueing for sequencing General ADL Comments: pt just transfered to chair with stedy from RN staff. pt positioned in chair and focused on R visual field and R UE  Cognition: Cognition Overall Cognitive Status: Impaired/Different from baseline Arousal/Alertness: Awake/alert Orientation Level: Oriented to person Attention: Sustained Sustained Attention: Appears intact Memory: Impaired Memory Impairment: Storage deficit,Retrieval deficit Awareness: Impaired Awareness Impairment: Intellectual impairment,Emergent impairment,Anticipatory impairment Problem Solving: Impaired Problem Solving Impairment: Functional basic,Verbal basic Behaviors: Other (comment) (frustration) Safety/Judgment: Impaired Cognition Arousal/Alertness: Awake/alert Behavior During Therapy: Impulsive Overall Cognitive Status: Impaired/Different from baseline Area of Impairment: Attention,Following commands,Safety/judgement,Awareness,Problem solving Orientation Level: Disoriented to,Place,Time,Situation,Person  Current Attention Level: Sustained Memory: Decreased recall of precautions Following  Commands: Follows one step commands consistently Safety/Judgement: Decreased awareness of safety,Decreased awareness of deficits Awareness: Intellectual Problem Solving: Slow processing,Difficulty sequencing,Requires verbal cues,Requires tactile cues General Comments: no verbal responses, attending some to the R due to wife being on R side. pt needs cues for pocketing. pt reaching to wipe mouth with R side food coming out but just wipes it with hand. Difficult to assess due to: Impaired communication  Physical Exam: Blood pressure (!) 144/75, pulse 64, temperature 98.5 F (36.9 C), temperature source Oral, resp. rate (!) 22, height _0  (1.854 m), weight 89.4 kg, SpO2 98 %. Physical Exam Constitutional:      General: He is not in acute distress. HENT:     Head: Normocephalic.     Comments: Crani wounds with staples left fronto-parietal area    Ears:     Comments: Bilateral hearing aids. HOH    Nose: Nose normal. No congestion.     Mouth/Throat:     Mouth: Mucous membranes are moist.     Pharynx: Oropharynx is clear.  Eyes:     Extraocular Movements: Extraocular movements intact.     Conjunctiva/sclera: Conjunctivae normal.     Pupils: Pupils are equal, round, and reactive to light.  Cardiovascular:     Rate and Rhythm: Normal rate and regular rhythm.     Heart sounds: No murmur heard.   Pulmonary:     Effort: Pulmonary effort is normal. No respiratory distress.     Breath sounds: Normal breath sounds. No wheezing.  Abdominal:     General: Bowel sounds are normal. There is no distension.     Palpations: Abdomen is soft.     Tenderness: There is no abdominal tenderness.  Musculoskeletal:        General: No swelling or tenderness.     Cervical back: Neck supple.     Right lower leg: No edema.     Left lower leg: No edema.  Skin:    General: Skin is warm.     Findings: Bruising present.  Neurological:     Comments: Patient is awake and alert.  Makes eye contact with  examiner.  Speech is a bit dysarthric. Mild right central 7.  Provides his name and age.  Follows simple commands but slow to process.  Limited medical historian. Hearing affects comprehension. RUE 3+ to 4/5 prox to distal. RLE 4/5. LUE and LLE 4 to 4+/5. Senses pain and light touch in all 4's.      Results for orders placed or performed during the hospital encounter of 11/19/20 (from the past 48 hour(s))  Comprehensive metabolic panel     Status: Abnormal   Collection Time: 12/01/20  6:10 AM  Result Value Ref Range   Sodium 141 135 - 145 mmol/L   Potassium 4.6 3.5 - 5.1 mmol/L   Chloride 106 98 - 111 mmol/L   CO2 25 22 - 32 mmol/L   Glucose, Bld 102 (H) 70 - 99 mg/dL    Comment: Glucose reference range applies only to samples taken after fasting for at least 8 hours.   BUN 38 (H) 8 - 23 mg/dL   Creatinine, Ser 1.50 (H) 0.61 - 1.24 mg/dL   Calcium 8.8 (L) 8.9 - 10.3 mg/dL   Total Protein 5.7 (L) 6.5 - 8.1 g/dL   Albumin 2.7 (L) 3.5 - 5.0 g/dL   AST 23 15 - 41 U/L   ALT 15 0 -  44 U/L   Alkaline Phosphatase 50 38 - 126 U/L   Total Bilirubin 0.7 0.3 - 1.2 mg/dL   GFR, Estimated 44 (L) >60 mL/min    Comment: (NOTE) Calculated using the CKD-EPI Creatinine Equation (2021)    Anion gap 10 5 - 15    Comment: Performed at [facility] [address] [place], [place] [address]  CBC     Status: Abnormal   Collection Time: 12/01/20  6:10 AM  Result Value Ref Range   WBC 6.7 4.0 - 10.5 K/uL   RBC 3.72 (L) 4.22 - 5.81 MIL/uL   Hemoglobin 11.8 (L) 13.0 - 17.0 g/dL   HCT 36.8 (L) 39.0 - 52.0 %   MCV 98.9 80.0 - 100.0 fL   MCH 31.7 26.0 - 34.0 pg   MCHC 32.1 30.0 - 36.0 g/dL   RDW 13.3 11.5 - 15.5 %   Platelets 106 (L) 150 - 400 K/uL    Comment: Immature Platelet Fraction may be clinically indicated, consider ordering this additional test [id number] REPEATED TO VERIFY PLATELET COUNT CONFIRMED BY SMEAR    nRBC 0.0 0.0 - 0.2 %    Comment: Performed at [facility], [place] [address], [place], [place] [address]   CT HEAD WO CONTRAST  Result Date: 11/30/2020 CLINICAL DATA:  Subdural hemorrhage, follow up EXAM: CT HEAD WITHOUT CONTRAST TECHNIQUE: Contiguous axial images were obtained from the base of the skull through the vertex without intravenous contrast. COMPARISON:  11/26/2020 and prior. FINDINGS: Brain: Residual left cerebral convexity subdural collection and tiny foci of pneumocephalus are slightly less conspicuous than prior exam. Chronic bilateral frontal insults. No ventriculomegaly. 2-3 mm rightward midline shift. No mass lesion. Mild cerebral atrophy with ex vacuo dilatation. Chronic microvascular ischemic changes. Vascular: No hyperdense vessel or unexpected calcification. Skull: Postsurgical appearance of the left calvarium. Sinuses/Orbits: Normal orbits. Tiny ethmoid and left maxillary sinus mucous retention cyst. No mastoid effusion. Other: None. IMPRESSION: Decreased conspicuity of left cerebral convexity subdural collection and tiny foci of pneumocephalus. Chronic bilateral frontal insults and microvascular ischemic changes. 2-3 mm rightward midline shift. Electronically Signed   By: Primitivo Gauze M.D.   On: 11/30/2020 14:38       Medical Problem List and Plan: 1.  Decreased functional mobility with altered mental status secondary to traumatic SDH.  Status post bur hole 11/21/2020  -patient may  shower  -ELOS/Goals: 8-12 days, min assist PT, min-sup with OT and mod I with cognition and speech/swallowing 2.  Antithrombotics: -DVT/anticoagulation: Subcutaneous heparin  -antiplatelet therapy: N/A 3. Pain Management: Tylenol as needed 4. Mood: Provide emotional support  -antipsychotic agents: N/A 5. Neuropsych: This patient is not quite capable of making decisions on his own behalf. 6. Skin/Wound Care: Routine skin checks  -can remove staples tomorrow or wednesday 7. Fluids/Electrolytes/Nutrition: Routine in and outs with follow-up chemistries 8.   Seizure disorder.  Depakote 250 mg every 6 hours 9.  Atrial fibrillation.  Cardiac rate controlled.  Eliquis on hold due to SDH x4 to 6 weeks. Reg rhythm today on exam. 10.  CKD stage III.  Follow-up chemistries 11.  Ascending thoracic aortic aneurysm.  Follow-up outpatient Dr. Caffie Pinto 12.  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  Follow-up outpatient cardiology services Dr. [name] 13.  BPH.  Proscar 5 mg daily.  Check PVR 14.  History of OSA.  Patient not on CPAP. 15.  Dysphagia.  Dysphagia #1 thin liquids.    -pt more alert today, should  be able to advance diet soon.   -Follow-up speech therapy    Cathlyn Parsons, PA-C 12/02/2020

## 2020-12-02 NOTE — Progress Notes (Addendum)
Patient transferring to [phone], report given nurse Benjie Karvonen . Wife at bedside personal belongings given to wife including hearing Aides case and Games developer. Patient has dentures in Upper plate only and also has bilateral hearing aides in. No c/o pain or shortness of breath at time of transfer. Swot nurse coming to floor to transfer patient. Patient left floor via bed.   Mailey Landstrom, Tivis Ringer, RN

## 2020-12-02 NOTE — H&P (Signed)
Physical Medicine and Rehabilitation Admission H&P        Chief Complaint  Patient presents with  . Weakness  : HPI: Cesar [name]. Congrove is an 87 year old right-handed male with history of atrial fibrillation maintained on Eliquis, CKD stage III, CVA, seizure disorder maintained on Depakote, history of DVT, hyperlipidemia, OSA not on CPAP, ascending thoracic aortic aneurysm followed by Dr. Caffie Pinto, severe aortic stenosis status post TAVR procedure 10/22/2020, nonobstructive CAD.  Per chart review lives with spouse.  Two-level home bed and bath main level 2 steps to entry.  Independent with assistive device.  He does have a daughter with good support.  Presented 11/19/2020 after increasing weakness with frequent falls and altered mental status.  Admission chemistries unremarkable except glucose 123, creatinine 1.34, hemoglobin 12.3, valproic acid level 26.  Cranial CT scan showed large chronic left subdural hematoma.  Patient underwent left-sided bur hole x2 placement of subdural drain for evacuation of subdural hematoma 11/21/2020 per Dr. Duffy Rhody.  He remains on Depakote as prior to admission   Follow-up cranial CT scan 11/22/2020 showed decompressed subdural hematoma on the left essentially resolved.  He was cleared to begin subcutaneous heparin for DVT prophylaxis 11/25/2020.  His chronic Eliquis remained on hold for approximately 6 weeks secondary to SDH.  Close monitoring of blood pressure initially on Cardene drip which is since been discontinued.  On 11/26/2020 patient with increasing right side weakness and aphasia.  Neurology consulted with MRI/MRA completed showing unchanged thickness of left convexity subdural hematoma.  No acute ischemia.  MRA unremarkable.  Old infarcts of the right frontal lobe and left caudate head.  Chronic small vessel ischemic changes of the white matter.  His cranial CT scan was completed for follow-up 11/30/2020 showing decreased conspicuity of left cerebral convexity  subdural collection and tiny foci of pneumocephalus.  2-3 mm rightward midline shift.  EEG completed showing no seizure activity however suggestive of cortical dysfunction left hemisphere likely secondary to underlying hematoma as well as moderate diffuse encephalopathy.  Currently maintained on a dysphagia #1 thin liquid diet..  Therapy evaluations completed due to patient's altered mental status decreased functional mobility he was admitted for a comprehensive rehab program.   Review of Systems  Constitutional: Negative for chills and fever.  HENT: Positive for hearing loss.   Eyes: Negative for blurred vision and double vision.  Respiratory: Positive for shortness of breath.   Cardiovascular: Positive for palpitations and leg swelling. Negative for chest pain.  Gastrointestinal: Positive for constipation. Negative for heartburn, nausea and vomiting.  Genitourinary: Positive for hematuria and urgency. Negative for dysuria and flank pain.  Musculoskeletal: Positive for falls, joint pain and myalgias.  Skin: Negative for rash.  Neurological: Positive for weakness.  Psychiatric/Behavioral: Positive for depression.  All other systems reviewed and are negative.       Past Medical History:  Diagnosis Date  . AAA (abdominal aortic aneurysm) ([place])    . CKD (chronic kidney disease), stage III (H[facility])      baseline Cr 1.4-1.5 in 2016-2017 by PCP)  . CVA (cerebral infarction)    . Depression    . DVT (deep venous thrombosis) ([place]) 2006  . H/O blood clots    . Hematuria    . HOH (hard of hearing)    . Hypercholesterolemia    . OSA (obstructive sleep apnea)      intol to CPAP  . Permanent atrial fibrillation ([place])    . Prostate enlargement    .  Pulmonary nodules    . 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 Buena Irish and Dr. Roxy Manns  . Thoracic ascending aortic aneurysm ([place])      4.5cm in 11/2018, stable since 2016   . TIA (transient ischemic attack)           Past Surgical History:  Procedure Laterality Date  . BURR HOLE Left 11/21/2020    Procedure: LEFT Side BURR HOLES,;  Surgeon: [name], MD;  Location: [facility];  Service: Neurosurgery;  Laterality: Left;  . EMBOLECTOMY Left 07/02/2016    Procedure: EMBOLECTOMY LEFT FEMORAL ARTERY;  Surgeon: [name], MD;  Location: [facility];  Service: Vascular;  Laterality: Left;  Marland Kitchen MESENTERIC ARTERY BYPASS N/A 08/06/2019    Procedure: SUPERIOR MESENTERIC ARTERY THROMBECTOMY;  Surgeon: [name], MD;  Location: [facility];  Service: Vascular;  Laterality: N/A;  . NM [place]   08/24/2002    markedly positive,subtle lateral ischemia  . PATCH ANGIOPLASTY   08/06/2019    Procedure: Patch Angioplasty of superior messenteric artery with RIGHT femoral vein;  Surgeon: [name], MD;  Location: [facility];  Service: Vascular;;  . PROSTATE SURGERY      . RIGHT HEART CATH AND CORONARY ANGIOGRAPHY N/A 10/21/2020    Procedure: RIGHT HEART CATH AND CORONARY ANGIOGRAPHY;  Surgeon: [name], MD;  Location: [facility];  Service: Cardiovascular;  Laterality: N/A;  . TEE WITHOUT CARDIOVERSION N/A 10/22/2020    Procedure: TRANSESOPHAGEAL ECHOCARDIOGRAM (TEE);  Surgeon: [name], MD;  Location: [facility];  Service: Open Heart Surgery;  Laterality: N/A;  . TRANSCATHETER AORTIC VALVE REPLACEMENT, TRANSFEMORAL Right 10/22/2020    Procedure: TRANSCATHETER AORTIC VALVE REPLACEMENT, TRANSFEMORAL;  Surgeon: [name], MD;  Location: [facility];  Service: Open Heart Surgery;  Laterality: Right;  . US ECHOCARDIOGRAPHY   01/06/2012    mod. AOV ca+,mild to mod. AS,mod mostly posterior MAC-mild MR         Family History  Problem Relation Age of Onset  . Heart failure Mother    . Diabetes Father    . Stroke Father      Social History:  reports that he quit smoking about 68 years ago. His smoking use included cigarettes. He  has a 10.00 pack-year smoking history. He has never used smokeless tobacco. He reports previous alcohol use. He reports that he does not use drugs. Allergies:       Allergies  Allergen Reactions  . Flomax [Tamsulosin Hcl] Other (See Comments)      Not able to urinate  . Levetiracetam Other (See Comments)      Unknown reaction          Medications Prior to Admission  Medication Sig Dispense Refill  . acetaminophen (TYLENOL) 500 MG tablet Take 500 mg by mouth every 8 (eight) hours as needed for mild pain.      Marland Kitchen amoxicillin (AMOXIL) 500 MG tablet Take 4 tablets (2000 mg) one hour prior to all dental visits. 8 tablet 11  . apixaban (ELIQUIS) 5 MG TABS tablet Take 5 mg by mouth 2 (two) times daily.      Marland Kitchen aspirin EC 81 MG tablet Take 81 mg by mouth daily. Swallow whole.      Marland Kitchen atorvastatin (LIPITOR) 80 MG tablet Take 80 mg by mouth daily.      Marland Kitchen b complex vitamins  capsule Take 1 capsule by mouth daily.      . divalproex (DEPAKOTE ER) 500 MG 24 hr tablet Take 1 tablet (500 mg total) by mouth at bedtime. 30 tablet 11  . finasteride (PROSCAR) 5 MG tablet Take 5 mg by mouth daily.      . Rivaroxaban (XARELTO) 15 MG TABS tablet Take 1 tablet (15 mg total) by mouth daily with supper. (Patient not taking: No sig reported) 90 tablet 3      Drug Regimen Review Drug regimen was reviewed and remains appropriate with no significant issues identified   Home: Home Living Family/patient expects to be discharged to:: Private residence Living Arrangements: Spouse/significant other Available Help at Discharge: Family,Available 24 hours/day Type of Home: House Home Access: Stairs to enter [employer] of Steps: 2 Entrance Stairs-Rails: Right Home Layout: Two level,Able to live on main level with bedroom/bathroom Bathroom Shower/Tub: Multimedia programmer: Standard Home Equipment: Environmental consultant - 2 wheels,Bedside commode,Cane - single point Additional Comments: home setup obtained from PT  evaluation  Lives With: Spouse   Functional History: Prior Function Level of Independence: Independent with assistive device(s) Comments: pt reports that he has 12 acres of land and likes riding his machinery on the land. 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[place], [place] with RW   Functional Status:  Mobility: Bed Mobility Overal bed mobility: Needs Assistance Bed Mobility: Supine to Sit Rolling: Max assist Supine to sit: Mod assist General bed mobility comments: oob to chair Transfers Overall transfer level: Needs assistance Equipment used: Rolling walker (2 wheeled) Transfers: Sit to/from [employer] Sit to Stand: Mod assist Stand pivot transfers: Mod assist General transfer comment: pt requires verbal and visual cues for hand placement. At baseline spouse holds RW and pt pulls up with walker, may be difficult habit to break with new onset neuro deficits Ambulation/Gait Ambulation/Gait assistance: Mod assist Gait Distance (Feet): 15 Feet (additional 10') Assistive device: Rolling walker (2 wheeled) Gait Pattern/deviations: Step-to pattern General Gait Details: pt with difficulty maintaining RUE on RW, often walker drifts to R side as only LUE is applying force to translate forward. This results in maintaining his base of support outside of RW with significant anterior and right lateral lean. PT provides physical assistance to manage RW Gait velocity: reduced Gait velocity interpretation: <1.31 ft/sec, indicative of household ambulator   ADL: ADL Overall ADL's : Needs assistance/impaired Eating/Feeding: Maximal assistance,Sitting Eating/Feeding Details (indicate cue type and reason): hand over hand without activation of wrist or hand noted. Pt does activate shoulder flexion extension adduction abduction, elbow flexion/ extension. pt initiates task with food presented. pt eating 100 % of tray Grooming: Wash/dry face,Brushing hair,Maximal  assistance,Sitting Grooming Details (indicate cue type and reason): pt has less initiation of grooming hair due to reaching outside visual attention Upper Body Bathing: Minimal assistance,Sitting Lower Body Bathing: Moderate assistance,Sit to/from stand Upper Body Dressing : Set up,Min guard,Minimal assistance,Sitting Lower Body Dressing: Moderate assistance,Bed level Lower Body Dressing Details (indicate cue type and reason): pt is unable to open sock and even with hooked sock over toes unable to pull up with hands. pt able to reach but not able to pull up due to fine motor challenges Toilet Transfer: +2 for physical assistance,Minimal assistance,Stand-pivot Toilet Transfer Details (indicate cue type and reason): simulated bed to chair Toileting- Clothing Manipulation and Hygiene: Moderate assistance,Sit to/from stand Functional mobility during ADLs: Minimal assistance,Rolling walker,Moderate assistance,Cueing for sequencing General ADL Comments: pt just transfered to chair with stedy  from RN staff. pt positioned in chair and focused on R visual field and R UE   Cognition: Cognition Overall Cognitive Status: Impaired/Different from baseline Arousal/Alertness: Awake/alert Orientation Level: Oriented to person Attention: Sustained Sustained Attention: Appears intact Memory: Impaired Memory Impairment: Storage deficit,Retrieval deficit Awareness: Impaired Awareness Impairment: Intellectual impairment,Emergent impairment,Anticipatory impairment Problem Solving: Impaired Problem Solving Impairment: Functional basic,Verbal basic Behaviors: Other (comment) (frustration) Safety/Judgment: Impaired Cognition Arousal/Alertness: Awake/alert Behavior During Therapy: Impulsive Overall Cognitive Status: Impaired/Different from baseline Area of Impairment: Attention,Following commands,Safety/judgement,Awareness,Problem solving Orientation Level: Disoriented to,Place,Time,Situation,Person Current  Attention Level: Sustained Memory: Decreased recall of precautions Following Commands: Follows one step commands consistently Safety/Judgement: Decreased awareness of safety,Decreased awareness of deficits Awareness: Intellectual Problem Solving: Slow processing,Difficulty sequencing,Requires verbal cues,Requires tactile cues General Comments: no verbal responses, attending some to the R due to wife being on R side. pt needs cues for pocketing. pt reaching to wipe mouth with R side food coming out but just wipes it with hand. Difficult to assess due to: Impaired communication   Physical Exam: Blood pressure (!) 144/75, pulse 64, temperature 98.5 F (36.9 C), temperature source Oral, resp. rate (!) 22, height _0  (1.854 m), weight 89.4 kg, SpO2 98 %. Physical Exam Constitutional:      General: He is not in acute distress. HENT:     Head: Normocephalic.     Comments: Crani wounds with staples left fronto-parietal area    Ears:     Comments: Bilateral hearing aids. HOH    Nose: Nose normal. No congestion.     Mouth/Throat:     Mouth: Mucous membranes are moist.     Pharynx: Oropharynx is clear.  Eyes:     Extraocular Movements: Extraocular movements intact.     Conjunctiva/sclera: Conjunctivae normal.     Pupils: Pupils are equal, round, and reactive to light.  Cardiovascular:     Rate and Rhythm: Normal rate and regular rhythm.     Heart sounds: No murmur heard.    Pulmonary:     Effort: Pulmonary effort is normal. No respiratory distress.     Breath sounds: Normal breath sounds. No wheezing.  Abdominal:     General: Bowel sounds are normal. There is no distension.     Palpations: Abdomen is soft.     Tenderness: There is no abdominal tenderness.  Musculoskeletal:        General: No swelling or tenderness.     Cervical back: Neck supple.     Right lower leg: No edema.     Left lower leg: No edema.  Skin:    General: Skin is warm.     Findings: Bruising present.   Neurological:     Comments: Patient is awake and alert.  Makes eye contact with examiner.  Speech is a bit dysarthric. Mild right central 7.  Provides his name and age.  Follows simple commands but slow to process.  Limited medical historian. Hearing affects comprehension. RUE 3+ to 4/5 prox to distal. RLE 4/5. LUE and LLE 4 to 4+/5. Senses pain and light touch in all 4's.        Lab Results Last 48 Hours        Results for orders placed or performed during the hospital encounter of 11/19/20 (from the past 48 hour(s))  Comprehensive metabolic panel     Status: Abnormal    Collection Time: 12/01/20  6:10 AM  Result Value Ref Range    Sodium 141 135 - 145 mmol/L    Potassium  4.6 3.5 - 5.1 mmol/L    Chloride 106 98 - 111 mmol/L    CO2 25 22 - 32 mmol/L    Glucose, Bld 102 (H) 70 - 99 mg/dL      Comment: Glucose reference range applies only to samples taken after fasting for at least 8 hours.    BUN 38 (H) 8 - 23 mg/dL    Creatinine, Ser 1.50 (H) 0.61 - 1.24 mg/dL    Calcium 8.8 (L) 8.9 - 10.3 mg/dL    Total Protein 5.7 (L) 6.5 - 8.1 g/dL    Albumin 2.7 (L) 3.5 - 5.0 g/dL    AST 23 15 - 41 U/L    ALT 15 0 - 44 U/L    Alkaline Phosphatase 50 38 - 126 U/L    Total Bilirubin 0.7 0.3 - 1.2 mg/dL    GFR, Estimated 44 (L) >60 mL/min      Comment: (NOTE) Calculated using the CKD-EPI Creatinine Equation (2021)      Anion gap 10 5 - 15      Comment: Performed at [facility], [place] [address] [place], [place] [address]  CBC     Status: Abnormal    Collection Time: 12/01/20  6:10 AM  Result Value Ref Range    WBC 6.7 4.0 - 10.5 K/uL    RBC 3.72 (L) 4.22 - 5.81 MIL/uL    Hemoglobin 11.8 (L) 13.0 - 17.0 g/dL    HCT 36.8 (L) 39.0 - 52.0 %    MCV 98.9 80.0 - 100.0 fL    MCH 31.7 26.0 - 34.0 pg    MCHC 32.1 30.0 - 36.0 g/dL    RDW 13.3 11.5 - 15.5 %    Platelets 106 (L) 150 - 400 K/uL      Comment: Immature Platelet Fraction may be clinically indicated, consider ordering this  additional test [id number] REPEATED TO VERIFY PLATELET COUNT CONFIRMED BY SMEAR      nRBC 0.0 0.0 - 0.2 %      Comment: Performed at [facility], [place] [address] [place], [place] [address]       Imaging Results (Last 48 hours)  CT HEAD WO CONTRAST   Result Date: 11/30/2020 CLINICAL DATA:  Subdural hemorrhage, follow up EXAM: CT HEAD WITHOUT CONTRAST TECHNIQUE: Contiguous axial images were obtained from the base of the skull through the vertex without intravenous contrast. COMPARISON:  11/26/2020 and prior. FINDINGS: Brain: Residual left cerebral convexity subdural collection and tiny foci of pneumocephalus are slightly less conspicuous than prior exam. Chronic bilateral frontal insults. No ventriculomegaly. 2-3 mm rightward midline shift. No mass lesion. Mild cerebral atrophy with ex vacuo dilatation. Chronic microvascular ischemic changes. Vascular: No hyperdense vessel or unexpected calcification. Skull: Postsurgical appearance of the left calvarium. Sinuses/Orbits: Normal orbits. Tiny ethmoid and left maxillary sinus mucous retention cyst. No mastoid effusion. Other: None. IMPRESSION: Decreased conspicuity of left cerebral convexity subdural collection and tiny foci of pneumocephalus. Chronic bilateral frontal insults and microvascular ischemic changes. 2-3 mm rightward midline shift. Electronically Signed   By: Primitivo Gauze M.D.   On: 11/30/2020 14:38             Medical Problem List and Plan: 1.  Decreased functional mobility with altered mental status secondary to chronic, 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  Status post bur hole 11/21/2020             -patient may  shower             -  ELOS/Goals: 8-12 days, min assist PT, min-sup with OT and mod I with cognition and speech/swallowing 2.  Antithrombotics: -DVT/anticoagulation: Subcutaneous heparin             -antiplatelet therapy: N/A 3. Pain Management: Tylenol as needed 4. Mood: Provide emotional  support             -antipsychotic agents: N/A 5. Neuropsych: This patient is not quite capable of making decisions on his own behalf. 6. Skin/Wound Care: Routine skin checks             -can remove staples tomorrow or wednesday 7. Fluids/Electrolytes/Nutrition: Routine in and outs with follow-up chemistries 8.  Seizure disorder.  Depakote 250 mg every 6 hours 9.  Atrial fibrillation.  Cardiac rate controlled.  Eliquis on hold due to SDH x4 to 6 weeks. Reg rhythm today on exam. 10.  CKD stage III.  Follow-up chemistries 11.  Ascending thoracic aortic aneurysm.  Follow-up outpatient Dr. Caffie Pinto 12.  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  Follow-up outpatient cardiology services Dr. [name] 13.  BPH.  Proscar 5 mg daily.  Check PVR 14.  History of OSA.  Patient not on CPAP. 15.  Dysphagia.  Dysphagia #1 thin liquids.               -pt more alert today, should be able to advance diet soon.              -Follow-up speech therapy     Cathlyn Parsons, PA-C 12/02/2020  I have personally performed a face to face diagnostic evaluation of this patient and formulated the key components of the plan.  Additionally, I have personally reviewed laboratory data, imaging studies, as well as relevant notes and concur with the physician assistant's documentation above.  The patient's status has not changed from the original H&P.  Any changes in documentation from the acute care chart have been noted above.  Meredith Staggers, MD, [facility]

## 2020-12-02 NOTE — Progress Notes (Signed)
Signed        PMR Admission Coordinator Pre-Admission Assessment   Patient: Cesar [name] is an 87 y.o., male MRN: [id number] DOB: [birth year] Height: 6\' 1"  (185.4 cm) Weight: 89.4 kg                                                                                                                                                  Insurance Information HMO:     PPO:      PCP:      IPA:      80/20: yes     OTHER:  PRIMARY: Medicare Part A and B      Policy#: [id number]   Subscriber: Pt CM Name:       Phone#:      Fax#:  Pre-Cert#: verified Civil engineer, contracting:  Benefits:  Phone #:      Name:  Eff. Date: 06/05/97 A and B     Deduct: $1566      Out of Pocket Max: n/a      Life Max: n/a CIR: 100%      SNF: 20 full days Outpatient: 80%     Co-Pay: 20% Home Health: 100%      Co-Pay:  DME: 80%     Co-Pay: 20% Providers: pt choice  SECONDARY: Generic Commercial      Policy#: [id number] ss      Phone#:    Financial Counselor:       Phone#:    The "Data Collection Information Summary" for patients in Inpatient Rehabilitation Facilities with attached "Privacy Act [place] Records" was provided and verbally reviewed with: Patient and Family   Emergency Contact Information         Contact Information     Name Relation Home Work [place], [place] Daughter     [phone]    Kalib, Bhagat [phone]        Yusuke, Beza Spouse [phone]        Treml,patsy Spouse     [phone]       Current Medical History  Patient Admitting Diagnosis: SDH History of Present Illness: Cesar [name] is a 87 y.o. right-handed male with history of atrial fibrillation on Eliquis, CKD stage III, CVA, seizure disorder, history of DVT, hyperlipidemia, OSA not on CPAP, ascending thoracic aortic aneurysm followed by Dr. Caffie Pinto, severe aortic stenosis status post TAVR 10/22/2020, nonobstructive CAD.  History taken from chart review and daughter due to [facility].  Patient lives with spouse.   Two-level home bed and bath main level 2 steps to entry.  Independent with assistive device.  He presented on 11/21/2020 after increasing weakness with increasing falls and AMS.  Cranial CT scan showed large chronic left SDH.  Patient underwent left-sided bur hole x2 placement of subdural drain for evacuation of subdural hematoma  on 11/21/2020 per Dr. Duffy Rhody.  Presently maintained on both Depakote as well as Dilantin for seizures.  Repeat head CT personally reviewed, showing improvement in SDH (status post decompression).  Tolerating a regular consistency diet.  Cardene drip initiated for blood pressure control.  His chronic Eliquis currently remains on hold x6 weeks.  Therapy evaluations completed recommendations of physical medicine rehab consult due to altered mental status and multiple falls.   Complete NIHSS TOTAL: 9 Glasgow Coma Scale Score: 15   Past Medical History      Past Medical History:  Diagnosis Date  . AAA (abdominal aortic aneurysm) ([place])    . CKD (chronic kidney disease), stage III (HCC)      baseline Cr 1.4-1.5 in 2016-2017 by PCP)  . CVA (cerebral infarction)    . Depression    . DVT (deep venous thrombosis) ([place]) 2006  . H/O blood clots    . Hematuria    . HOH (hard of hearing)    . Hypercholesterolemia    . OSA (obstructive sleep apnea)      intol to CPAP  . Permanent atrial fibrillation ([place])    . Prostate enlargement    . Pulmonary nodules    . 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 Buena Irish and Dr. Roxy Manns  . Thoracic ascending aortic aneurysm ([place])      4.5cm in 11/2018, stable since 2016  . TIA (transient ischemic attack)        Family History  family history includes Diabetes in his father; Heart failure in his mother; Stroke in his father.   Prior Rehab/Hospitalizations:  Has the patient had prior rehab or hospitalizations prior to admission? Yes and No   Has the  patient had major surgery during 100 days prior to admission? Yes   Current Medications    Current Facility-Administered Medications:  .  0.9 %  sodium chloride infusion, , Intravenous, Continuous, [name], MD, Last Rate: 10 mL/hr at 11/26/20 1400, Infusion Verify at 11/26/20 1400 .  acetaminophen (TYLENOL) tablet 650 mg, 650 mg, Oral, Q6H PRN, 650 mg at 11/25/20 0535 **OR** acetaminophen (TYLENOL) suppository 650 mg, 650 mg, Rectal, Q6H PRN, [name], MD .  chlorhexidine (PERIDEX) 0.12 % solution 15 mL, 15 mL, Mouth/Throat, Once **OR** MEDLINE mouth rinse, 15 mL, Mouth Rinse, Once, [name], MD .  divalproex (DEPAKOTE) DR tablet 250 mg, 250 mg, Oral, Q6H, [name], MD, 250 mg at 12/02/20 0501 .  docusate sodium (COLACE) capsule 100 mg, 100 mg, Oral, BID, [name], MD, 100 mg at 12/02/20 4944 .  enalaprilat (VASOTEC) injection 1.25 mg, 1.25 mg, Intravenous, Q6H PRN, Dawley, Troy C, DO, 1.25 mg at 11/23/20 0313 .  finasteride (PROSCAR) tablet 5 mg, 5 mg, Oral, Daily, [name], MD, 5 mg at 12/02/20 9675 .  heparin injection 5,000 Units, 5,000 Units, Subcutaneous, Q12H, [name], MD, 5,000 Units at 12/02/20 [phone] .  labetalol (NORMODYNE) injection 10 mg, 10 mg, Intravenous, Q10 min PRN, [name], MD, 10 mg at 11/23/20 0902 .  morphine 2 MG/ML injection 2 mg, 2 mg, Intravenous, Q2H PRN, [name], MD .  nicardipine (CARDENE) 20mg  in 0.86% saline 268ml IV infusion (0.1 mg/ml), 3-15 mg/hr, Intravenous, Continuous, [name], MD .  ondansetron [facility]) tablet 4 mg, 4 mg, Oral, Q4H PRN **OR** ondansetron (ZOFRAN) injection 4 mg,  4 mg, Intravenous, Q4H PRN, [name], MD .  pantoprazole (PROTONIX) EC tablet 40 mg, 40 mg, Oral, Q supper, Alvira Philips, [place], 40 mg at 12/01/20 1753 .  polyethylene glycol (MIRALAX / GLYCOLAX) packet 17 g, 17 g, Oral, Daily PRN, [name], MD, 17 g at 11/30/20  0831 .  promethazine (PHENERGAN) tablet 12.5-25 mg, 12.5-25 mg, Oral, Q4H PRN, [name], MD .  sodium phosphate (FLEET) 7-19 GM/118ML enema 1 enema, 1 enema, Rectal, Once PRN, [name], MD   Patients Current Diet:     Diet Order                      DIET - DYS 1 Room service appropriate? No; Fluid consistency: Thin  Diet effective now                      Precautions / Restrictions Precautions Precautions: Fall Precaution Comments: pt is inattentive to [place], [place] with hearing aides Restrictions Weight Bearing Restrictions: No Other Position/Activity Restrictions: HOH, does have hearing aides    Has the patient had 2 or more falls or a fall with injury in the past year?Yes   Prior Activity Level Limited Community (1-2x/wk): Pt. went out to his shop but mostly stayed at home   Prior Functional Level Prior Function Level of Independence: Independent with assistive device(s) Comments: pt reports that he has 12 acres of land and likes riding his machinery on the land. 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[place], [place] with [place]: Did the patient need help bathing, dressing, using the toilet or eating?  Needed some help   Indoor Mobility: Did the patient need assistance with walking from room to room (with or without device)? Independent   Stairs: Did the patient need assistance with internal or external stairs (with or without device)? Needed some help   Functional Cognition: Did the patient need help planning regular tasks such as shopping or remembering to take medications? Needed some help   Home Assistive Devices / [place] Devices/Equipment: Hearing aid Home Equipment: Walker - 2 wheels,Bedside commode,Cane - single point   Prior Device Use: Indicate devices/aids used by the patient prior to current illness, exacerbation or injury? None of the above   Current Functional Level Cognition   Arousal/Alertness:  Awake/alert Overall Cognitive Status: Impaired/Different from baseline Difficult to assess due to: Impaired communication Current Attention Level: Sustained Orientation Level: Oriented to person Following Commands: Follows one step commands consistently Safety/Judgement: Decreased awareness of safety,Decreased awareness of deficits General Comments: no verbal responses, attending some to the R due to wife being on R side. pt needs cues for pocketing. pt reaching to wipe mouth with R side food coming out but just wipes it with hand. Attention: Sustained Sustained Attention: Appears intact Memory: Impaired Memory Impairment: Storage deficit,Retrieval deficit Awareness: Impaired Awareness Impairment: Intellectual impairment,Emergent impairment,Anticipatory impairment Problem Solving: Impaired Problem Solving Impairment: Functional basic,Verbal basic Behaviors: Other (comment) (frustration) Safety/Judgment: Impaired    Extremity Assessment (includes Sensation/Coordination)   Upper Extremity Assessment: RUE deficits/detail RUE Deficits / Details: inattention, wrist drop and not digit activation RUE Sensation: decreased light touch,decreased proprioception RUE Coordination: decreased gross motor,decreased fine motor  Lower Extremity Assessment: Defer to PT evaluation RLE Deficits / Details: grossly 4-/5, requires increased cues and multimodal cues to follow commands related to MMT, pt then able to sustain without buckling in stance RLE Coordination: decreased  fine motor     ADLs   Overall ADL's : Needs assistance/impaired Eating/Feeding: Maximal assistance,Sitting Eating/Feeding Details (indicate cue type and reason): hand over hand without activation of wrist or hand noted. Pt does activate shoulder flexion extension adduction abduction, elbow flexion/ extension. pt initiates task with food presented. pt eating 100 % of tray Grooming: Wash/dry face,Brushing hair,Maximal  assistance,Sitting Grooming Details (indicate cue type and reason): pt has less initiation of grooming hair due to reaching outside visual attention Upper Body Bathing: Minimal assistance,Sitting Lower Body Bathing: Moderate assistance,Sit to/from stand Upper Body Dressing : Set up,Min guard,Minimal assistance,Sitting Lower Body Dressing: Moderate assistance,Bed level Lower Body Dressing Details (indicate cue type and reason): pt is unable to open sock and even with hooked sock over toes unable to pull up with hands. pt able to reach but not able to pull up due to fine motor challenges Toilet Transfer: +2 for physical assistance,Minimal assistance,Stand-pivot Toilet Transfer Details (indicate cue type and reason): simulated bed to chair Toileting- Clothing Manipulation and Hygiene: Moderate assistance,Sit to/from stand Functional mobility during ADLs: Minimal assistance,Rolling walker,Moderate assistance,Cueing for sequencing General ADL Comments: pt just transfered to chair with stedy from RN staff. pt positioned in chair and focused on R visual field and R UE     Mobility   Overal bed mobility: Needs Assistance Bed Mobility: Supine to Sit Rolling: Max assist Supine to sit: Mod assist General bed mobility comments: oob to chair     Transfers   Overall transfer level: Needs assistance Equipment used: Rolling walker (2 wheeled) Transfers: Sit to/from [employer] Sit to Stand: Mod assist Stand pivot transfers: Mod assist General transfer comment: pt requires verbal and visual cues for hand placement. At baseline spouse holds RW and pt pulls up with walker, may be difficult habit to break with new onset neuro deficits     Ambulation / Gait / Stairs / Wheelchair Mobility   Ambulation/Gait Ambulation/Gait assistance: Mod assist Gait Distance (Feet): 15 Feet (additional 10') Assistive device: Rolling walker (2 wheeled) Gait Pattern/deviations: Step-to pattern General Gait  Details: pt with difficulty maintaining RUE on RW, often walker drifts to R side as only LUE is applying force to translate forward. This results in maintaining his base of support outside of RW with significant anterior and right lateral lean. PT provides physical assistance to manage RW Gait velocity: reduced Gait velocity interpretation: <1.31 ft/sec, indicative of household ambulator     Posture / Balance Dynamic Sitting Balance Sitting balance - Comments: minG-minA, posterior lean Balance Overall balance assessment: Needs assistance Sitting-balance support: No upper extremity supported Sitting balance-Leahy Scale: Fair Sitting balance - Comments: minG-minA, posterior lean Postural control: Posterior lean Standing balance support: Bilateral upper extremity supported Standing balance-Leahy Scale: Poor Standing balance comment: reliant on BUE support of RW, minA     Special needs/care consideration Skin surgical incision: head, Continuous IV: 0.9% sodium chloride infusion: 3mL/hr, External urinary catheter and Designated visitor Jery Hollern, wife        Previous Home Environment (from acute therapy documentation) Living Arrangements: Spouse/significant other  Lives With: Spouse Available Help at Discharge: Family,Available 24 hours/day Type of Home: House Home Layout: Two level,Able to live on main level with bedroom/bathroom Home Access: Stairs to enter Entrance Stairs-Rails: Right Entrance Stairs-Number of Steps: 2 Bathroom Shower/Tub: Multimedia programmer: Standard Home Care Services: No Additional Comments: home setup obtained from PT evaluation   Discharge Living Setting Plans for Discharge Living Setting: Patient's home Type of Home at Discharge:  House Discharge Home Layout: Two level,Able to live on main level with bedroom/bathroom Discharge Home Access: Stairs to enter Entrance Stairs-Rails: Right Entrance Stairs-Number of Steps: 2 Discharge Bathroom  Shower/Tub: Tub/shower unit,Walk-in shower Discharge Bathroom Toilet: Standard Discharge Bathroom Accessibility: No Does the patient have any problems obtaining your medications?: Yes (Describe)   Social/Family/Support Systems Patient Roles: Spouse Contact Information: [phone] Anticipated Caregiver: Gwenyth Allegra Anticipated Caregiver's Contact Information: [phone] Ability/Limitations of Caregiver: Can provide Min A Caregiver Availability: 24/7 Discharge Plan Discussed with Primary Caregiver: Yes Is Caregiver In Agreement with Plan?: Yes Does Caregiver/Family have Issues with Lodging/Transportation while Pt is in Rehab?: Yes     Goals Patient/Family Goal for Rehab: PT Min A, OT Min A to Supervision, SLP Mod I Expected length of stay: 8-12 days Pt/Family Agrees to Admission and willing to participate: Yes Program Orientation Provided & Reviewed with Pt/Caregiver Including Roles  & Responsibilities: Yes     Decrease burden of Care through IP rehab admission: Specialzed equipment needs, Decrease number of caregivers, Bowel and bladder program and Patient/family education     Possible need for SNF placement upon discharge:not anticipated     Patient Condition: This patient's medical and functional status has changed since the consult dated: 11/22/20 in which the Rehabilitation Physician determined and documented that the patient's condition is appropriate for intensive rehabilitative care in an inpatient rehabilitation facility. See "History of Present Illness" (above) for medical update. Functional changes are: Pt currently Mod A with transfers and Mod A 15' with gait; Max A with ADLs. Patient's medical and functional status update has been discussed with the Rehabilitation physician and patient remains appropriate for inpatient rehabilitation. Will admit to inpatient rehab today.   Preadmission Screen Completed By: Genella Mech, CCC-SLP with day of admission updates by  Bethel Born, CCC-SLP, 12/02/2020 11:46 AM ______________________________________________________________________   Discussed status with Dr. Naaman Plummer on 12/02/20  at 11:46 AM and received approval for admission today.   Admission Coordinator: Genella Mech with day of admission updates by  Bethel Born, time 11:46 AM Sudie Grumbling 12/02/20

## 2020-12-02 NOTE — TOC Transition Note (Signed)
Transition of Care (TOC) - CM/SW Discharge Note Marvetta Gibbons RN,BSN Transitions of Care Unit 4NP (non trauma) - RN Case Manager See Treatment Team for direct Phone #   Patient Details  Name: KAREL TURPEN MRN: [id number] Date of Birth: [birth year]  [facility] CM/SW Contact:  Dawayne Patricia, RN Phone Number: 12/02/2020, 12:24 PM   Clinical Narrative:    Pt admitted from home w/ wife, son lives next door. SDH s/p burr holes.  Notified by [facility] that pt has bed available today for admission to [place]. MD has cleared by for transition to [facility] re[facility], pt/family agreeable.  Plan to transition to [facility] INPT rehab later today.    Final next level of care: IP Rehab Facility Barriers to Discharge: No Barriers Identified   Patient Goals and CMS Choice Patient states their goals for this hospitalization and ongoing recovery are:: rehab CMS Medicare.gov Compare Post Acute Care list provided to:: Patient Represenative (must comment) Choice offered to / list presented to : Spouse  Discharge Placement                 [facility]      Discharge Plan and Services   Discharge Planning Services: CM Consult Post Acute Care Choice: IP Rehab          DME Arranged: N/A DME Agency: NA       HH Arranged: NA HH Agency: NA        Social Determinants of Health ([place]) Interventions     Readmission Risk Interventions Readmission Risk Prevention Plan 12/02/2020 08/15/2019  Transportation Screening Complete Complete  PCP or Specialist Appt within 5-7 Days Not Complete Complete  Not Complete comments INPT rehab -  Home Care Screening Complete Complete  Medication Review (RN CM) Complete Complete  Some recent data might be hidden

## 2020-12-03 DIAGNOSIS — S065X0D Traumatic subdural hemorrhage without loss of consciousness, subsequent encounter: Principal | ICD-10-CM

## 2020-12-03 LAB — COMPREHENSIVE METABOLIC PANEL
ALT: 17 U/L (ref 0–44)
AST: 23 U/L (ref 15–41)
Albumin: 2.8 g/dL — ABNORMAL LOW (ref 3.5–5.0)
Alkaline Phosphatase: 50 U/L (ref 38–126)
Anion gap: 9 (ref 5–15)
BUN: 31 mg/dL — ABNORMAL HIGH (ref 8–23)
CO2: 25 mmol/L (ref 22–32)
Calcium: 8.8 mg/dL — ABNORMAL LOW (ref 8.9–10.3)
Chloride: 104 mmol/L (ref 98–111)
Creatinine, Ser: 1.31 mg/dL — ABNORMAL HIGH (ref 0.61–1.24)
GFR, Estimated: 52 mL/min — ABNORMAL LOW (ref >60)
Glucose, Bld: 100 mg/dL — ABNORMAL HIGH (ref 70–99)
Potassium: 4.5 mmol/L (ref 3.5–5.1)
Sodium: 138 mmol/L (ref 135–145)
Total Bilirubin: 1 mg/dL (ref 0.3–1.2)
Total Protein: 6.1 g/dL — ABNORMAL LOW (ref 6.5–8.1)

## 2020-12-03 LAB — CBC WITH DIFFERENTIAL/PLATELET
Abs Immature Granulocytes: 0.11 10*3/uL — ABNORMAL HIGH (ref 0.00–0.07)
Basophils Absolute: 0.1 10*3/uL (ref 0.0–0.1)
Basophils Relative: 1 %
Eosinophils Absolute: 0.4 10*3/uL (ref 0.0–0.5)
Eosinophils Relative: 5 %
HCT: 39.3 % (ref 39.0–52.0)
Hemoglobin: 13.3 g/dL (ref 13.0–17.0)
Immature Granulocytes: 2 %
Lymphocytes Relative: 14 %
Lymphs Abs: 1.1 10*3/uL (ref 0.7–4.0)
MCH: 33 pg (ref 26.0–34.0)
MCHC: 33.8 g/dL (ref 30.0–36.0)
MCV: 97.5 fL (ref 80.0–100.0)
Monocytes Absolute: 0.6 10*3/uL (ref 0.1–1.0)
Monocytes Relative: 8 %
Neutro Abs: 5.4 10*3/uL (ref 1.7–7.7)
Neutrophils Relative %: 70 %
Platelets: 108 10*3/uL — ABNORMAL LOW (ref 150–400)
RBC: 4.03 MIL/uL — ABNORMAL LOW (ref 4.22–5.81)
RDW: 13.2 % (ref 11.5–15.5)
WBC: 7.6 10*3/uL (ref 4.0–10.5)
nRBC: 0 % (ref 0.0–0.2)

## 2020-12-03 NOTE — Evaluation (Signed)
Occupational Therapy Assessment and Plan  Patient Details  Name: Cesar [name] MRN: [id number] Date of Birth: 01-Jul-1929  OT Diagnosis: altered mental status, cognitive deficits, hemiplegia affecting non-dominant side and muscle weakness (generalized) Rehab Potential: Rehab Potential (ACUTE ONLY): Excellent ELOS: 17-21 days   Today's Date: 12/03/2020 OT Individual Time: 8502-7741 OT Individual Time Calculation (min): 61 min     Hospital Problem: Active Problems:   Traumatic subdural hematoma [facility])   Past Medical History:  Past Medical History:  Diagnosis Date  . AAA (abdominal aortic aneurysm) ([place])   . CKD (chronic kidney disease), stage III (HCC)    baseline Cr 1.4-1.5 in 2016-2017 by PCP)  . CVA (cerebral infarction)   . Depression   . DVT (deep venous thrombosis) ([place]) 2006  . H/O blood clots   . Hematuria   . HOH (hard of hearing)   . Hypercholesterolemia   . OSA (obstructive sleep apnea)    intol to CPAP  . Permanent atrial fibrillation ([place])   . Prostate enlargement   . Pulmonary nodules   . 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 Buena Irish and Dr. Roxy Manns  . Thoracic ascending aortic aneurysm ([place])    4.5cm in 11/2018, stable since 2016  . TIA (transient ischemic attack)    Past Surgical History:  Past Surgical History:  Procedure Laterality Date  . BURR HOLE Left 11/21/2020   Procedure: LEFT Side BURR HOLES,;  Surgeon: [name], MD;  Location: [place];  Service: Neurosurgery;  Laterality: Left;  . EMBOLECTOMY Left 07/02/2016   Procedure: EMBOLECTOMY LEFT FEMORAL ARTERY;  Surgeon: [name], MD;  Location: [place];  Service: Vascular;  Laterality: Left;  Marland Kitchen MESENTERIC ARTERY BYPASS N/A 08/06/2019   Procedure: SUPERIOR MESENTERIC ARTERY THROMBECTOMY;  Surgeon: [name], MD;  Location: [place];  Service: Vascular;  Laterality: N/A;  . NM [place]   08/24/2002   markedly positive,subtle lateral ischemia  . PATCH ANGIOPLASTY  08/06/2019   Procedure: Patch Angioplasty of superior messenteric artery with RIGHT femoral vein;  Surgeon: [name], MD;  Location: [place];  Service: Vascular;;  . PROSTATE SURGERY    . RIGHT HEART CATH AND CORONARY ANGIOGRAPHY N/A 10/21/2020   Procedure: RIGHT HEART CATH AND CORONARY ANGIOGRAPHY;  Surgeon: [name], MD;  Location: [place] [facility];  Service: Cardiovascular;  Laterality: N/A;  . TEE WITHOUT CARDIOVERSION N/A 10/22/2020   Procedure: TRANSESOPHAGEAL ECHOCARDIOGRAM (TEE);  Surgeon: [name], MD;  Location: [place];  Service: Open Heart Surgery;  Laterality: N/A;  . TRANSCATHETER AORTIC VALVE REPLACEMENT, TRANSFEMORAL Right 10/22/2020   Procedure: TRANSCATHETER AORTIC VALVE REPLACEMENT, TRANSFEMORAL;  Surgeon: [name], MD;  Location: [place];  Service: Open Heart Surgery;  Laterality: Right;  . US ECHOCARDIOGRAPHY  01/06/2012   mod. AOV ca+,mild to mod. AS,mod mostly posterior MAC-mild MR    Assessment & Plan Clinical Impression: Patient is a 87 y.o. year old male with recent admission to the hospital on 11/19/2020 after increasing weakness with frequent falls and altered mental status. Admission chemistries unremarkable except glucose 123, creatinine 1.34, hemoglobin 12.3, valproic acid level 26. Cranial CT scan showed large chronic left subdural hematoma. Patient underwent left-sided bur hole x2 placement of subdural drain for evacuation of subdural hematoma 11/21/2020 per Dr. Duffy Rhody. He remains on Depakote as prior to admission Follow-up cranial CT scan 11/22/2020 showed decompressed  subdural hematoma on the left essentially resolved. He was cleared to begin subcutaneous heparin for DVT prophylaxis 11/25/2020. His chronic Eliquis remained on hold for approximately 6 weeks secondary to SDH. Close monitoring of blood pressure initially on Cardene drip  which is since been discontinued. On 11/26/2020 patient with increasing right side weakness and aphasia. Neurology consulted with MRI/MRA completed showing unchanged thickness of left convexity subdural hematoma. No acute ischemia. MRA unremarkable. Old infarcts of the right frontal lobe and left caudate head. Chronic small vessel ischemic changes of the white matter. His cranial CT scan was completed for follow-up 11/30/2020 showing decreased conspicuity of left cerebral convexity subdural collection and tiny foci of pneumocephalus. 2-3 mm rightward midline shift. EEG completed showing no seizure activity however suggestive of cortical dysfunction left hemisphere likely secondary to underlying hematoma as well as moderate diffuse encephalopathy. Patient transferred to CIR on 12/02/2020 .    Patient currently requires mod with basic self-care skills secondary to muscle weakness and muscle paralysis, impaired timing and sequencing and decreased coordination, decreased attention, decreased awareness and decreased safety awareness and decreased sitting balance, decreased standing balance, decreased postural control, hemiplegia and decreased balance strategies.  Prior to hospitalization, patient could complete ADLs with independent .  Patient will benefit from skilled intervention to decrease level of assist with basic self-care skills and increase independence with basic self-care skills prior to discharge home with care partner.  Anticipate patient will require 24 hour supervision and follow up home health.  OT - End of Session Activity Tolerance: Improving Endurance Deficit: Yes Endurance Deficit Description: Pt continually wanting to lay down after segments of therapy OT Assessment Rehab Potential (ACUTE ONLY): Excellent OT Patient demonstrates impairments in the following area(s): Balance;Cognition;Motor;Safety;Perception OT Basic ADL's Functional Problem(s):  Eating;Grooming;Bathing;Dressing;Toileting OT Transfers Functional Problem(s): Toilet;Tub/Shower OT Additional Impairment(s): Fuctional Use of Upper Extremity OT Plan OT Intensity: Minimum of 1-2 x/day, 45 to 90 minutes OT Frequency: 5 out of 7 days OT Duration/Estimated Length of Stay: 17-21 days OT Treatment/Interventions: Balance/vestibular training;Discharge planning;Pain management;Self Care/advanced ADL retraining;Therapeutic Activities;UE/LE Coordination activities;Therapeutic Exercise;Patient/family education;Functional mobility training;DME/adaptive equipment instruction;Neuromuscular re-education;UE/LE Strength taining/ROM;Psychosocial support;Community reintegration;Cognitive remediation/compensation OT Self Feeding Anticipated Outcome(s): independent OT Basic Self-Care Anticipated Outcome(s): min guard OT Toileting Anticipated Outcome(s): min guard OT Bathroom Transfers Anticipated Outcome(s): min guard OT Recommendation Patient destination: Home Follow Up Recommendations: Home health OT;24 hour supervision/assistance Equipment Recommended: To be determined   OT Evaluation Precautions/Restrictions  Precautions Precautions: Fall Precaution Comments: HOH, expressive difficulty Restrictions Weight Bearing Restrictions: No  Pain Pain Assessment Pain Scale: 0-10 Pain Score: 0-No pain Home Living/Prior Functioning Home Living Family/patient expects to be discharged to:: Private residence Living Arrangements: Spouse/significant other Available Help at Discharge: Family,Available 24 hours/day (son is retired and lives nearby) Type of Home: House Home Access: Stairs to enter Technical brewer of Steps: 2 Entrance Stairs-Rails: Right Home Layout: One level Bathroom Shower/Tub: Chiropodist: Programmer, systems: Yes  Lives With: Spouse IADL History Homemaking Responsibilities: No Current License: No Occupation: Retired Prior  Function Level of Independence: Requires assistive device for independence,Independent with basic ADLs,Needs assistance with homemaking,Independent with transfers  Able to Take Stairs?: Yes Driving: No (drives his tractor but not driving his vehicle secondary to hx of seizures) Vocation: Retired Surveyor, mining Baseline Vision/History: Wears glasses Wears Glasses: Reading only Patient Visual Report: No change from baseline Vision Assessment?: Yes Ocular Range of Motion: Within Functional Limits Alignment/Gaze Preference: Within Defined Limits Tracking/Visual Pursuits: Decreased smoothness of horizontal tracking;Decreased smoothness of vertical tracking Convergence:  Impaired (comment) Visual Fields: No apparent deficits Perception  Perception: Within Functional Limits Praxis Praxis: Impaired Praxis Impairment Details: Motor planning Cognition Overall Cognitive Status: Impaired/Different from baseline Arousal/Alertness: Awake/alert Orientation Level: Person;Place;Situation Person: Oriented Place: Oriented Situation: Disoriented (stated his balance, but did not state that he had a brain bleed) Year: 2022 Month: February Day of Week: Correct Memory: Impaired Memory Impairment: Decreased short term memory;Decreased recall of new information;Retrieval deficit Decreased Short Term Memory: Verbal complex;Verbal basic;Functional basic Immediate Memory Recall: Sock;Blue;Bed Memory Recall Sock: With Cue Memory Recall Blue: With Cue Memory Recall Bed: Not able to recall Attention: Focused;Sustained;Selective Focused Attention: Appears intact Sustained Attention: Appears intact Selective Attention: Impaired Selective Attention Impairment: Verbal basic;Functional basic Awareness: Impaired Awareness Impairment: Intellectual impairment;Emergent impairment Problem Solving: Impaired Problem Solving Impairment: Functional basic;Verbal basic Executive Function: Organizing;Initiating Organizing:  Impaired Organizing Impairment: Verbal basic;Functional basic Initiating: Impaired Initiating Impairment: Verbal basic Behaviors: Impulsive Safety/Judgment: Impaired Sensation Sensation Light Touch: Appears Intact Hot/Cold: Appears Intact Proprioception: Appears Intact Stereognosis: Not tested Coordination Gross Motor Movements are Fluid and Coordinated: No Fine Motor Movements are Fluid and Coordinated: No Coordination and Movement Description: Pt with slight decreased coordination with RUE.  He uses functionally at a diminshed level, but does exhibit decreased efficiency with grasping items to pull up such as his socks, but he was able to complete with multiple attempts. Heel Shin Test: dysmetric R LE Motor  Motor Motor: Hemiplegia;Abnormal postural alignment and control Motor - Skilled Clinical Observations: slight R hemiparesis UE/LE  Trunk/Postural Assessment  Cervical Assessment Cervical Assessment: Exceptions to [facility] (cervical protraction) Thoracic Assessment Thoracic Assessment: Exceptions to [facility] (throacic kyphosis) Lumbar Assessment Lumbar Assessment: Exceptions to [facility] (posterior pelvic tilt) Postural Control Postural Control: Deficits on evaluation Righting Reactions: delayed and inadequate Protective Responses: delayed and inadequate  Balance Balance Balance Assessed: Yes Standardized Balance Assessment Standardized Balance Assessment: Timed Up and Go Test Timed Up and Go Test TUG: Normal TUG Normal TUG (seconds): 50.78 Static Sitting Balance Static Sitting - Balance Support: Feet supported Static Sitting - Level of Assistance: 5: Stand by assistance Dynamic Sitting Balance Dynamic Sitting - Balance Support: During functional activity Dynamic Sitting - Level of Assistance: 4: Min Insurance risk surveyor Standing - Balance Support: Bilateral upper extremity supported Static Standing - Level of Assistance: 3: Mod assist Dynamic Standing  Balance Dynamic Standing - Balance Support: During functional activity;Bilateral upper extremity supported Dynamic Standing - Level of Assistance: 3: Mod assist Extremity/Trunk Assessment RUE Assessment RUE Assessment: Exceptions to [facility] Active Range of Motion (AROM) Comments: shoulder flexion 0-120 degrees all other joints AROM WFLS General Strength Comments: strength overall 3+/5 througohut LUE Assessment LUE Assessment: Within Functional Limits General Strength Comments: 5/5 throughout  Care Tool Care Tool Self Care Eating   Eating Assist Level: Supervision/Verbal cueing    Oral Care    Oral Care Assist Level: Supervision/Verbal cueing    Bathing   Body parts bathed by patient: Right arm;Left arm;Chest;Abdomen;Right upper leg;Left upper leg;Right lower leg;Left lower leg;Face Body parts bathed by helper: Front perineal area;Buttocks   Assist Level: Moderate Assistance - Patient 50 - 74%    Upper Body Dressing(including orthotics)   What is the patient wearing?: Pull over shirt   Assist Level: Supervision/Verbal cueing    Lower Body Dressing (excluding footwear)   What is the patient wearing?: Pants;Incontinence brief Assist for lower body dressing: Moderate Assistance - Patient 50 - 74%    Putting on/Taking off footwear   What is the patient wearing?: Non-skid  slipper socks;Ted hose Assist for footwear: Moderate Assistance - Patient 50 - 74%       Care Tool Toileting Toileting activity   Assist for toileting: Moderate Assistance - Patient 50 - 74%     Care Tool Bed Mobility Roll left and right activity        Sit to lying activity   Sit to lying assist level: Supervision/Verbal cueing    Lying to sitting edge of bed activity   Lying to sitting edge of bed assist level: Supervision/Verbal cueing     Care Tool Transfers Sit to stand transfer   Sit to stand assist level: Moderate Assistance - Patient 50 - 74%    Chair/bed transfer   Chair/bed transfer  assist level: Moderate Assistance - Patient 50 - 74%     Toilet transfer   Assist Level: Moderate Assistance - Patient 50 - 74%     Care Tool Cognition Expression of Ideas and Wants Expression of Ideas and Wants: Frequent difficulty - frequently exhibits difficulty with expressing needs and ideas   Understanding Verbal and Non-Verbal Content Understanding Verbal and Non-Verbal Content: Usually understands - understands most conversations, but misses some part/intent of message. Requires cues at times to understand   Memory/Recall Ability *first 3 days only Memory/Recall Ability *first 3 days only: That he or she is in a hospital/hospital unit    Refer to Care Plan for [place] 1    Recommendations for other services: None    Skilled Therapeutic Intervention ADL ADL Eating: Supervision/safety Where Assessed-Eating: Wheelchair Grooming: Supervision/safety Where Assessed-Grooming: Wheelchair Upper Body Bathing: Supervision/safety Where Assessed-Upper Body Bathing: Edge of bed Lower Body Bathing: Moderate assistance Where Assessed-Lower Body Bathing: Edge of bed Upper Body Dressing: Supervision/safety Where Assessed-Upper Body Dressing: Edge of bed Lower Body Dressing: Moderate assistance Where Assessed-Lower Body Dressing: Edge of bed Toileting: Moderate assistance Where Assessed-Toileting: Bedside Commode Toilet Transfer: Moderate assistance Toilet Transfer Method: Stand pivot Science writer: Radiographer, therapeutic: Not assessed Social research officer, government: Not assessed Mobility  Bed Mobility Bed Mobility: Sit to Supine;Supine to Sit Rolling Right: Contact Guard/Touching assist Rolling Left: Contact Guard/Touching assist Supine to Sit: Contact Guard/Touching assist Sit to Supine: Supervision/Verbal cueing Transfers Sit to Stand: Moderate Assistance - Patient 50-74% Stand to Sit: Moderate Assistance - Patient  50-74%  Session Note:  Pt in bed with spouse and pt's son present.  He exhibits severe HOH still with hearing aides in.  He was able to transfer to the EOB with min guard assist in preparation for bathing and dressing tasks.  Min instructional cueing for initiation of bathing and using soap.  Mod instructional cueing for sequencing and thoroughness.  He was able to complete sit to stand with min to mod assist when washing his peri area and when pulling his brief and pants over his feet.  Increased posterior lean noted in sitting as well as standing.  Throughout session as he would complete parts of bathing or after donning his shirt, he would try to lay back down in the bed, requiring max instructional cueing and min assist to sit back up.  Good functional use of the non-dominant RUE was observed at a diminished level, however he would need two attempts to pull his sock over his heel with use of the RUE.  Max assist for functional mobility without an assistive device to and from the bathroom with mod assist when ambulating with the RW.  Increased posterior lean noted  throughout.  Finished with pt transferring back to the bed for a quick rest before PT eval.  Discussed pt's current level and expectations of LOS and goals with pt/family.  They are in agreement and voice understanding.  Pt with limited speech noted throughout session with increased dysrthria and some difficulty managing his saliva.    Discharge Criteria: Patient will be discharged from OT if patient refuses treatment 3 consecutive times without medical reason, if treatment goals not met, if there is a change in medical status, if patient makes no progress towards goals or if patient is discharged from hospital.  The above assessment, treatment plan, treatment alternatives and goals were discussed and mutually agreed upon: by patient and by family  Lithzy Bernard OTR/L 12/03/2020, 1:01 PM

## 2020-12-03 NOTE — Progress Notes (Signed)
PROGRESS NOTE   Subjective/Complaints: "when am I going home?" Slept well, ate breakfast  ROS- neg CP SOB, N/V/D  Objective:   No results found. Recent Labs    12/01/20 0610 12/03/20 0509  WBC 6.7 7.6  HGB 11.8* 13.3  HCT 36.8* 39.3  PLT 106* 108*   Recent Labs    12/01/20 0610 12/03/20 0509  NA 141 138  K 4.6 4.5  CL 106 104  CO2 25 25  GLUCOSE 102* 100*  BUN 38* 31*  CREATININE 1.50* 1.31*  CALCIUM 8.8* 8.8*    Intake/Output Summary (Last 24 hours) at 12/03/2020 0846 Last data filed at 12/03/2020 0755 Gross per 24 hour  Intake 400 ml  Output 600 ml  Net -200 ml     Pressure Injury 10/22/20 Buttocks Left;Mid Stage 2 -  Partial thickness loss of dermis presenting as a shallow open injury with a red, pink wound bed without slough. (Active)  10/22/20 1447  Location: Buttocks  Location Orientation: Left;Mid  Staging: Stage 2 -  Partial thickness loss of dermis presenting as a shallow open injury with a red, pink wound bed without slough.  Wound Description (Comments):   Present on Admission: Yes    Physical Exam: Vital Signs Blood pressure (!) 159/73, pulse 61, temperature 98.2 F (36.8 C), resp. rate 18, height 6\' 1"  (1.854 m), weight 84.6 kg, SpO2 100 %.  Oriented to person and place "[facility]" off on time  Wed not tues, Feb not March  General: No acute distress Mood and affect are appropriate Heart: Regular rate and rhythm no rubs murmurs or extra sounds Lungs: Clear to auscultation, breathing unlabored, no rales or wheezes Abdomen: Positive bowel sounds, soft nontender to palpation, nondistended Extremities: No clubbing, cyanosis, or edema Skin: No evidence of breakdown, no evidence of rash Neurologic: Cranial nerves II through XII intact, motor strength is 5/5 in bilateral deltoid, bicep, tricep, grip, hip flexor, knee extensors, ankle dorsiflexor and plantar flexor  Musculoskeletal: Full range  of motion in all 4 extremities. No joint swelling   Assessment/Plan: 1. Functional deficits which require 3+ hours per day of interdisciplinary therapy in a comprehensive inpatient rehab setting.  Physiatrist is providing close team supervision and 24 hour management of active medical problems listed below.  Physiatrist and rehab team continue to assess barriers to discharge/monitor patient progress toward functional and medical goals  Care Tool:  Bathing              Bathing assist       Upper Body Dressing/Undressing Upper body dressing   What is the patient wearing?: Hospital gown only    Upper body assist Assist Level: Moderate Assistance - Patient 50 - 74%    Lower Body Dressing/Undressing Lower body dressing            Lower body assist Assist for lower body dressing: Moderate Assistance - Patient 50 - 74%     Toileting Toileting    Toileting assist Assist for toileting: Moderate Assistance - Patient 50 - 74%     Transfers Chair/bed transfer  Transfers assist           Locomotion Ambulation   Ambulation  assist              Walk 10 feet activity   Assist           Walk 50 feet activity   Assist           Walk 150 feet activity   Assist           Walk 10 feet on uneven surface  activity   Assist           Wheelchair     Assist               Wheelchair 50 feet with 2 turns activity    Assist            Wheelchair 150 feet activity     Assist          Blood pressure (!) 159/73, pulse 61, temperature 98.2 F (36.8 C), resp. rate 18, height 6\' 1"  (1.854 m), weight 84.6 kg, SpO2 100 %.  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 Status post bur hole 11/21/2020 -patient may shower -ELOS/Goals: 8-12 days, min assist PT,  min-sup with OT and mod I with cognition and speech/swallowing 2. Antithrombotics: -DVT/anticoagulation:Subcutaneous heparin -antiplatelet therapy: N/A 3. Pain Management:Tylenol as needed 4. Mood:Provide emotional support -antipsychotic agents: N/A 5. Neuropsych: This patientisnot quitecapable of making decisions onhisown behalf. 6. Skin/Wound Care:Routine skin checks -can remove staples tomorrow or wednesday 7. Fluids/Electrolytes/Nutrition:Routine in and outs with follow-up chemistries 8. Seizure disorder. Depakote 250 mg every 6 hours 9. Atrial fibrillation. Cardiac rate controlled. Eliquis on hold due to SDH x4 to 6 weeks. Reg rhythm today on exam. Vitals:   12/02/20 1956 12/03/20 0448  BP: (!) 143/65 (!) 159/73  Pulse: (!) 52 61  Resp: 18 18  Temp: 97.9 F (36.6 C) 98.2 F (36.8 C)  SpO2: 97% 100%  rate controlled 3/1 10. CKD stage III. Follow-up chemistries 11. Ascending thoracic aortic aneurysm. Follow-up outpatient Dr. Caffie Pinto 12. 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 Follow-up outpatient cardiology services Dr. [name] 13. BPH. Proscar 5 mg daily. Check PVR 14. History of OSA. Patient not on CPAP. 15. Dysphagia. Dysphagia #1 thin liquids.  -pt more alert today, should be able to advance diet soon.  -Follow-up speech therapy    LOS: 1 days A FACE TO [place] Genelda Roark 12/03/2020, 8:46 AM

## 2020-12-03 NOTE — Evaluation (Signed)
Physical Therapy Assessment and Plan  Patient Details  Name: Cesar [name] MRN: [id number] Date of Birth: August 02, 1929  PT Diagnosis: Abnormal posture, Abnormality of gait, Cognitive deficits, Coordination disorder, Difficulty walking, Hemiparesis dominant and Impaired cognition Rehab Potential: Good ELOS: 2-2.5weeks   Today's Date: 12/03/2020 PT Individual Time: 8828-0034 PT Individual Time Calculation (min): 57 min    Hospital Problem: Active Problems:   Traumatic subdural hematoma [facility])   Past Medical History:  Past Medical History:  Diagnosis Date  . AAA (abdominal aortic aneurysm) ([place])   . CKD (chronic kidney disease), stage III (HCC)    baseline Cr 1.4-1.5 in 2016-2017 by PCP)  . CVA (cerebral infarction)   . Depression   . DVT (deep venous thrombosis) ([place]) 2006  . H/O blood clots   . Hematuria   . HOH (hard of hearing)   . Hypercholesterolemia   . OSA (obstructive sleep apnea)    intol to CPAP  . Permanent atrial fibrillation ([place])   . Prostate enlargement   . Pulmonary nodules   . 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 Buena Irish and Dr. Roxy Manns  . Thoracic ascending aortic aneurysm ([place])    4.5cm in 11/2018, stable since 2016  . TIA (transient ischemic attack)    Past Surgical History:  Past Surgical History:  Procedure Laterality Date  . BURR HOLE Left 11/21/2020   Procedure: LEFT Side BURR HOLES,;  Surgeon: [name], MD;  Location: [place];  Service: Neurosurgery;  Laterality: Left;  . EMBOLECTOMY Left 07/02/2016   Procedure: EMBOLECTOMY LEFT FEMORAL ARTERY;  Surgeon: [name], MD;  Location: [place];  Service: Vascular;  Laterality: Left;  Marland Kitchen MESENTERIC ARTERY BYPASS N/A 08/06/2019   Procedure: SUPERIOR MESENTERIC ARTERY THROMBECTOMY;  Surgeon: [name], MD;  Location: [place];  Service: Vascular;  Laterality: N/A;  . NM [place]  08/24/2002    markedly positive,subtle lateral ischemia  . PATCH ANGIOPLASTY  08/06/2019   Procedure: Patch Angioplasty of superior messenteric artery with RIGHT femoral vein;  Surgeon: [name], MD;  Location: [place];  Service: Vascular;;  . PROSTATE SURGERY    . RIGHT HEART CATH AND CORONARY ANGIOGRAPHY N/A 10/21/2020   Procedure: RIGHT HEART CATH AND CORONARY ANGIOGRAPHY;  Surgeon: [name], MD;  Location: [place] CV LAB;  Service: Cardiovascular;  Laterality: N/A;  . TEE WITHOUT CARDIOVERSION N/A 10/22/2020   Procedure: TRANSESOPHAGEAL ECHOCARDIOGRAM (TEE);  Surgeon: [name], MD;  Location: [place];  Service: Open Heart Surgery;  Laterality: N/A;  . TRANSCATHETER AORTIC VALVE REPLACEMENT, TRANSFEMORAL Right 10/22/2020   Procedure: TRANSCATHETER AORTIC VALVE REPLACEMENT, TRANSFEMORAL;  Surgeon: [name], MD;  Location: [place];  Service: Open Heart Surgery;  Laterality: Right;  . US ECHOCARDIOGRAPHY  01/06/2012   mod. AOV ca+,mild to mod. AS,mod mostly posterior MAC-mild MR    Assessment & Plan Clinical Impression: Patient is a 87 y.o. year old male with history of atrial fibrillation maintained on Eliquis, CKD stage III, CVA, seizure disorder maintained on Depakote, history of DVT, hyperlipidemia, OSA not on CPAP, ascending thoracic aortic aneurysm followed by Dr. Caffie Pinto, severe aortic stenosis status post TAVR procedure 10/22/2020, nonobstructive CAD. Per chart review lives with spouse. Two-level home bed and bath main level 2 steps to entry. Independent with assistive device. He does have a daughter with good support. Presented 11/19/2020 after increasing weakness with frequent  falls and altered mental status. Admission chemistries unremarkable except glucose 123, creatinine 1.34, hemoglobin 12.3, valproic acid level 26. Cranial CT scan showed large chronic left subdural hematoma. Patient underwent left-sided bur hole x2 placement of subdural drain for  evacuation of subdural hematoma 11/21/2020 per Dr. Duffy Rhody. He remains on Depakote as prior to admission Follow-up cranial CT scan 11/22/2020 showed decompressed subdural hematoma on the left essentially resolved. He was cleared to begin subcutaneous heparin for DVT prophylaxis 11/25/2020. His chronic Eliquis remained on hold for approximately 6 weeks secondary to SDH. Close monitoring of blood pressure initially on Cardene drip which is since been discontinued. On 11/26/2020 patient with increasing right side weakness and aphasia. Neurology consulted with MRI/MRA completed showing unchanged thickness of left convexity subdural hematoma. No acute ischemia. MRA unremarkable. Old infarcts of the right frontal lobe and left caudate head. Chronic small vessel ischemic changes of the white matter. His cranial CT scan was completed for follow-up 11/30/2020 showing decreased conspicuity of left cerebral convexity subdural collection and tiny foci of pneumocephalus. 2-3 mm rightward midline shift. EEG completed showing no seizure activity however suggestive of cortical dysfunction left hemisphere likely secondary to underlying hematoma as well as moderate diffuse encephalopathy. Currently maintained on a dysphagia #1 thin liquid diet.. Therapy evaluations completed due to patient's altered mental status decreased functional mobility he was admitted for a comprehensive rehab program..   Patient currently requires mod with mobility secondary to muscle weakness, decreased cardiorespiratoy endurance, unbalanced muscle activation, decreased coordination and decreased motor planning, decreased motor planning, decreased initiation, decreased awareness, decreased problem solving, decreased safety awareness, decreased memory and delayed processing and decreased sitting balance, decreased standing balance, decreased postural control, hemiplegia and decreased balance strategies.  Prior to hospitalization, patient  was modified independent  with mobility and lived with Spouse in a House home.  Home access is 2Stairs to enter.  Patient will benefit from skilled PT intervention to maximize safe functional mobility, minimize fall risk and decrease caregiver burden for planned discharge home with 24 hour assist.  Anticipate patient will benefit from follow up [facility] at discharge.  PT - End of Session Activity Tolerance: Tolerates 30+ min activity with multiple rests Endurance Deficit: Yes PT Assessment Rehab Potential (ACUTE/IP ONLY): Good PT Barriers to Discharge: Decreased caregiver support;Lack of/limited family support PT Patient demonstrates impairments in the following area(s): Balance;Behavior;Endurance;Motor;Perception;Safety;Sensory;Skin Integrity PT Transfers Functional Problem(s): Bed Mobility;Bed to Chair;Car;Furniture PT Locomotion Functional Problem(s): Ambulation;Wheelchair Mobility;Stairs PT Plan PT Intensity: Minimum of 1-2 x/day ,45 to 90 minutes PT Frequency: 5 out of 7 days PT Duration Estimated Length of Stay: 2-2.5weeks PT Treatment/Interventions: Ambulation/gait training;Discharge planning;Functional mobility training;Psychosocial support;Therapeutic Activities;Visual/perceptual remediation/compensation;Balance/vestibular training;Disease management/prevention;Neuromuscular re-education;Skin care/wound management;Therapeutic Exercise;Wheelchair propulsion/positioning;Cognitive remediation/compensation;DME/adaptive equipment instruction;Pain management;Splinting/orthotics;UE/LE Strength taining/ROM;Community reintegration;Functional electrical stimulation;Patient/family education;Stair training;UE/LE Coordination activities PT Transfers Anticipated Outcome(s): spv PT Locomotion Anticipated Outcome(s): CGA PT Recommendation Recommendations for Other Services: Therapeutic Recreation consult Therapeutic Recreation Interventions: Stress management;Outing/community reintergration Follow Up  Recommendations: Home health PT;24 hour supervision/assistance Patient destination: Home Equipment Details: owns a RW   PT Evaluation Precautions/Restrictions Precautions Precautions: Fall Precaution Comments: HOH, expressive difficulty Restrictions Weight Bearing Restrictions: No Home Living/Prior Functioning Home Living Living Arrangements: Spouse/significant other Available Help at Discharge: Family;Available 24 hours/day (son is retired and lives nearby) Type of Home: [place]: One level Bathroom Shower/Tub: Chiropodist: Standard Bathroom Accessibility: Yes  Lives With: Spouse Prior Function Driving: No (drives his tractor but not driving his vehicle secondary to hx of seizures) Vocation: Retired Vision/Perception  Vision - Assessment Ocular Range of Motion: Within Functional Limits Alignment/Gaze Preference: Within Defined Limits Tracking/Visual Pursuits: Decreased smoothness of horizontal tracking;Decreased smoothness of vertical tracking Convergence: Impaired (comment) Perception Perception: Within Functional Limits Praxis Praxis: Impaired Praxis Impairment Details: Motor planning  Cognition Overall Cognitive Status: Impaired/Different from baseline Arousal/Alertness: Awake/alert Orientation Level: Oriented to person;Oriented to place Attention: Focused;Sustained;Selective Focused Attention: Appears intact Sustained Attention: Appears intact Selective Attention: Impaired Selective Attention Impairment: Verbal basic;Functional basic Memory: Impaired Memory Impairment: Decreased short term memory;Decreased recall of new information;Retrieval deficit Decreased Short Term Memory: Verbal complex;Verbal basic;Functional basic Immediate Memory Recall: Sock;Blue;Bed Memory Recall Sock: With Cue Memory Recall Blue: With Cue Memory Recall Bed: Not able to recall Awareness: Impaired Awareness Impairment: Intellectual impairment;Emergent  impairment Problem Solving: Impaired Problem Solving Impairment: Functional basic;Verbal basic Executive Function: Organizing;Initiating Organizing: Impaired Organizing Impairment: Verbal basic;Functional basic Initiating: Impaired Initiating Impairment: Verbal basic Behaviors: Impulsive Safety/Judgment: Impaired Sensation Sensation Light Touch: Appears Intact Hot/Cold: Appears Intact Proprioception: Appears Intact Stereognosis: Not tested Coordination Gross Motor Movements are Fluid and Coordinated: No Fine Motor Movements are Fluid and Coordinated: No Coordination and Movement Description: Pt with slight decreased coordination with RUE.  He uses functionally at a diminshed level, but does exhibit decreased efficiency with grasping items to pull up such as his socks, but he was able to complete with multiple attempts. Heel Shin Test: dysmetric R LE Motor  Motor Motor: Hemiplegia;Abnormal postural alignment and control Motor - Skilled Clinical Observations: slight R hemiparesis UE/LE   Trunk/Postural Assessment  Cervical Assessment Cervical Assessment: Exceptions to [facility] (cervical protraction) Thoracic Assessment Thoracic Assessment: Exceptions to [facility] (throacic kyphosis) Lumbar Assessment Lumbar Assessment: Exceptions to [facility] (posterior pelvic tilt) Postural Control Postural Control: Deficits on evaluation Righting Reactions: delayed and inadequate Protective Responses: delayed and inadequate  Balance Balance Balance Assessed: Yes Standardized Balance Assessment Standardized Balance Assessment: Timed Up and Go Test Timed Up and Go Test TUG: Normal TUG Normal TUG (seconds): 50.78 Static Sitting Balance Static Sitting - Balance Support: Feet supported Static Sitting - Level of Assistance: 5: Stand by assistance Dynamic Sitting Balance Dynamic Sitting - Balance Support: During functional activity Dynamic Sitting - Level of Assistance: 4: Min Dispensing optician Standing - Balance Support: Bilateral upper extremity supported Static Standing - Level of Assistance: 3: Mod assist Dynamic Standing Balance Dynamic Standing - Balance Support: During functional activity;Bilateral upper extremity supported Dynamic Standing - Level of Assistance: 3: Mod assist Extremity Assessment  RUE Assessment RUE Assessment: Exceptions to [facility] Active Range of Motion (AROM) Comments: shoulder flexion 0-120 degrees all other joints AROM WFLS General Strength Comments: strength overall 3+/5 througohut LUE Assessment LUE Assessment: Within Functional Limits General Strength Comments: 5/5 throughout RLE Assessment RLE Assessment: Exceptions to [facility] General Strength Comments: limited accuracy of assessment due to patient being hard of hearing RLE Strength Right Hip Flexion: 3+/5 Right Hip ABduction: 3+/5 Right Hip ADduction: 3+/5 Right Knee Flexion: 3+/5 Right Knee Extension: 3+/5 Right Ankle Dorsiflexion: 3+/5 Right Ankle Plantar Flexion: 3+/5 LLE Assessment LLE Assessment: Exceptions to [facility] General Strength Comments: limited accuracy due to patient being hard of hearing LLE Strength Left Hip Flexion: 3+/5 Left Hip ABduction: 4-/5 Left Hip ADduction: 4-/5 Left Knee Flexion: 4-/5 Left Knee Extension: 4-/5 Left Ankle Dorsiflexion: 4-/5 Left Ankle Plantar Flexion: 4-/5  Care Tool Care Tool Bed Mobility Roll left and right activity        Sit to lying activity   Sit to lying assist level: Supervision/Verbal cueing  Lying to sitting edge of bed activity   Lying to sitting edge of bed assist level: Supervision/Verbal cueing     Care Tool Transfers Sit to stand transfer   Sit to stand assist level: Moderate Assistance - Patient 50 - 74%    Chair/bed transfer   Chair/bed transfer assist level: Moderate Assistance - Patient 50 - 74%     Toilet transfer   Assist Level: Moderate Assistance - Patient 50 - 74%    Car transfer   Car  transfer assist level: Minimal Assistance - Patient > 75%      Care Tool Locomotion Ambulation   Assist level: 2 helpers Assistive device: Walker-rolling Max distance: 85  Walk 10 feet activity   Assist level: Moderate Assistance - Patient - 50 - 74% Assistive device: Walker-rolling   Walk 50 feet with 2 turns activity   Assist level: 2 helpers Assistive device: Walker-rolling  Walk 150 feet activity Walk 150 feet activity did not occur: Safety/medical concerns      Walk 10 feet on uneven surfaces activity Walk 10 feet on uneven surfaces activity did not occur: Safety/medical concerns      Stairs   Assist level: Moderate Assistance - Patient - 50 - 74% Stairs assistive device: 2 hand rails Max number of stairs: 2  Walk up/down 1 step activity   Walk up/down 1 step (curb) assist level: Moderate Assistance - Patient - 50 - 74% Walk up/down 1 step or curb assistive device: 2 hand rails Walk up/down 4 steps activity did not occuR: Safety/medical concerns  Walk up/down 4 steps activity      Walk up/down 12 steps activity Walk up/down 12 steps activity did not occur: Safety/medical concerns      Pick up small objects from floor Pick up small object from the floor (from standing position) activity did not occur: Safety/medical concerns      Wheelchair Will patient use wheelchair at discharge?: No Type of Wheelchair: Manual   Wheelchair assist level: Minimal Assistance - Patient > 75% Max wheelchair distance: 150  Wheel 50 feet with 2 turns activity   Assist Level: Minimal Assistance - Patient > 75%  Wheel 150 feet activity   Assist Level: Minimal Assistance - Patient > 75%    Refer to Care Plan for Long Term Goals  SHORT TERM GOAL WEEK 1 PT Short Term Goal 1 (Week 1): Patient will complete bed mobility with supervision PT Short Term Goal 2 (Week 1): Patient will transfer bed <> wc with MinA and LRAD PT Short Term Goal 3 (Week 1): Patient will ambulate 19f with LRAD and  MinA PT Short Term Goal 4 (Week 1): Patient will negotiate 2 steps with 1 HR and MinA  Recommendations for other services: Therapeutic Recreation  Stress management and Outing/community reintegration  Skilled Therapeutic Intervention Mobility Bed Mobility Bed Mobility: Sit to Supine;Supine to Sit Rolling Right: Contact Guard/Touching assist Rolling Left: Contact Guard/Touching assist Supine to Sit: Contact Guard/Touching assist Sit to Supine: Supervision/Verbal cueing Transfers Transfers: Sit to Stand;Stand to Sit;Stand Pivot Transfers Sit to Stand: Moderate Assistance - Patient 50-74% Stand to Sit: Moderate Assistance - Patient 50-74% Stand Pivot Transfers: Moderate Assistance - Patient 50 - 74% Stand Pivot Transfer Details: Verbal cues for gait pattern;Verbal cues for safe use of DME/AE;Verbal cues for technique;Verbal cues for precautions/safety;Tactile cues for posture;Tactile cues for sequencing Squat Pivot Transfers: Minimal Assistance - Patient > 75% Transfer (Assistive device): 1 person hand held assist Locomotion  Gait Ambulation: Yes Gait Assistance:  Moderate Assistance - Patient 50-74%;2 Helpers (wc follow for safety) Gait Distance (Feet): 85 Feet Assistive device: Rolling walker Gait Assistance Details: Verbal cues for sequencing;Verbal cues for safe use of DME/AE;Verbal cues for technique;Verbal cues for gait pattern;Verbal cues for precautions/safety;Tactile cues for posture;Tactile cues for sequencing Gait Gait: Yes Gait Pattern: Impaired Gait Pattern: Step-to pattern;Decreased step length - left;Shuffle;Left foot flat;Right foot flat;Narrow base of support;Trunk flexed;Decreased hip/knee flexion - left;Decreased hip/knee flexion - right Gait velocity: decreased Wheelchair Mobility Wheelchair Mobility: Yes Wheelchair Assistance: Minimal assistance - Patient >75% Wheelchair Propulsion: Both upper extremities Wheelchair Parts Management: Needs assistance Distance:  150  Patient received supine in bed, agreeable to PT eval. He denies pain. Wife and son at bedside. Patient is very hard of hearing, but wife present to verify home environment and PLOF. He requires grossly ModA. He does have a posterior bias in standing and generally poor safety awareness requiring up to Max verbal cues to ensure safety. At end of session, patient remaining up in wc, chair alarm on, call light within reach, wife at bedside.   Discharge Criteria: Patient will be discharged from PT if patient refuses treatment 3 consecutive times without medical reason, if treatment goals not met, if there is a change in medical status, if patient makes no progress towards goals or if patient is discharged from hospital.  The above assessment, treatment plan, treatment alternatives and goals were discussed and mutually agreed upon: by patient and by family  Debbora Dus 12/03/2020, 12:59 PM

## 2020-12-03 NOTE — Evaluation (Signed)
Speech Language Pathology Assessment and Plan  Patient Details  Name: TANMAY HALTEMAN MRN: [id number] Date of Birth: 04-04-1929  SLP Diagnosis: Dysarthria;Speech and Language deficits;Dysphagia;Cognitive Impairments  Rehab Potential: Good ELOS: 2.5-3 weeks    Today's Date: 12/03/2020 SLP Individual Time: 4235-3614 SLP Individual Time Calculation (min): 55 min   Hospital Problem: Active Problems:   Traumatic subdural hematoma [facility])  Past Medical History:  Past Medical History:  Diagnosis Date  . AAA (abdominal aortic aneurysm) ([place])   . CKD (chronic kidney disease), stage III (HCC)    baseline Cr 1.4-1.5 in 2016-2017 by PCP)  . CVA (cerebral infarction)   . Depression   . DVT (deep venous thrombosis) ([place]) 2006  . H/O blood clots   . Hematuria   . HOH (hard of hearing)   . Hypercholesterolemia   . OSA (obstructive sleep apnea)    intol to CPAP  . Permanent atrial fibrillation ([place])   . Prostate enlargement   . Pulmonary nodules   . 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 Buena Irish and Dr. Roxy Manns  . Thoracic ascending aortic aneurysm ([place])    4.5cm in 11/2018, stable since 2016  . TIA (transient ischemic attack)    Past Surgical History:  Past Surgical History:  Procedure Laterality Date  . BURR HOLE Left 11/21/2020   Procedure: LEFT Side BURR HOLES,;  Surgeon: [name], MD;  Location: [facility] [facility];  Service: Neurosurgery;  Laterality: Left;  . EMBOLECTOMY Left 07/02/2016   Procedure: EMBOLECTOMY LEFT FEMORAL ARTERY;  Surgeon: [name], MD;  Location: [facility];  Service: Vascular;  Laterality: Left;  Marland Kitchen MESENTERIC ARTERY BYPASS N/A 08/06/2019   Procedure: SUPERIOR MESENTERIC ARTERY THROMBECTOMY;  Surgeon: [name], MD;  Location: [facility];  Service: Vascular;  Laterality: N/A;  . NM [place]  08/24/2002   markedly positive,subtle lateral ischemia  . PATCH  ANGIOPLASTY  08/06/2019   Procedure: Patch Angioplasty of superior messenteric artery with RIGHT femoral vein;  Surgeon: [name], MD;  Location: [facility];  Service: Vascular;;  . PROSTATE SURGERY    . RIGHT HEART CATH AND CORONARY ANGIOGRAPHY N/A 10/21/2020   Procedure: RIGHT HEART CATH AND CORONARY ANGIOGRAPHY;  Surgeon: [name], MD;  Location: [place] CV LAB;  Service: Cardiovascular;  Laterality: N/A;  . TEE WITHOUT CARDIOVERSION N/A 10/22/2020   Procedure: TRANSESOPHAGEAL ECHOCARDIOGRAM (TEE);  Surgeon: [name], MD;  Location: [facility];  Service: Open Heart Surgery;  Laterality: N/A;  . TRANSCATHETER AORTIC VALVE REPLACEMENT, TRANSFEMORAL Right 10/22/2020   Procedure: TRANSCATHETER AORTIC VALVE REPLACEMENT, TRANSFEMORAL;  Surgeon: [name], MD;  Location: [facility];  Service: Open Heart Surgery;  Laterality: Right;  . US ECHOCARDIOGRAPHY  01/06/2012   mod. AOV ca+,mild to mod. AS,mod mostly posterior MAC-mild MR    Assessment / Plan / Recommendation Clinical Impression  [id number] D. Buonocore is an 87 year old right-handed male with history of atrial fibrillation maintained on Eliquis, CKD stage III, CVA, seizure disorder maintained on Depakote, history of DVT, hyperlipidemia, OSA not on CPAP, ascending thoracic aortic aneurysm followed by Dr. Caffie Pinto, severe aortic stenosis status post TAVR procedure 10/22/2020, nonobstructive CAD. Per chart review lives with spouse. Two-level home bed and bath main level 2 steps to entry. Independent with assistive device. He does have a daughter with good support. Presented 11/19/2020 after increasing weakness with frequent falls and altered mental status. Admission  chemistries unremarkable except glucose 123, creatinine 1.34, hemoglobin 12.3, valproic acid level 26. Cranial CT scan showed large chronic left subdural hematoma. Patient underwent left-sided bur hole x2 placement of subdural drain for evacuation of  subdural hematoma 11/21/2020 per Dr. Duffy Rhody. He remains on Depakote as prior to admission Follow-up cranial CT scan 11/22/2020 showed decompressed subdural hematoma on the left essentially resolved. He was cleared to begin subcutaneous heparin for DVT prophylaxis 11/25/2020. His chronic Eliquis remained on hold for approximately 6 weeks secondary to SDH. Close monitoring of blood pressure initially on Cardene drip which is since been discontinued. On 11/26/2020 patient with increasing right side weakness and aphasia. Neurology consulted with MRI/MRA completed showing unchanged thickness of left convexity subdural hematoma. No acute ischemia. MRA unremarkable. Old infarcts of the right frontal lobe and left caudate head. Chronic small vessel ischemic changes of the white matter. His cranial CT scan was completed for follow-up 11/30/2020 showing decreased conspicuity of left cerebral convexity subdural collection and tiny foci of pneumocephalus. 2-3 mm rightward midline shift. EEG completed showing no seizure activity however suggestive of cortical dysfunction left hemisphere likely secondary to underlying hematoma as well as moderate diffuse encephalopathy. Currently maintained on a dysphagia #1 thin liquid diet.. Therapy evaluations completed due to patient's altered mental status decreased functional mobility he was admitted for a comprehensive rehab program 11/06/20 and SLP evaluations were administered 12/03/20 with results as follows:  Pt presents with s/sx mild oral dysphagia. Although pt's ordered diet reflected Dysphagia 1 (puree) textures, last ST note from acute care indicated recommendation for upgrade to Dysphagia 3 (mechanical soft). Therefore, trials of advanced solids presented to pt, along with thin liquids. Although rapid intake noted, pt consumed Dysphagia 3 textures with efficient mastication and only trace lingual residue, which he cleared with liquid washes when cued. No overt  s/sx aspiration noted during solid or thin liquid intake. Would recommend pt upgrade to Dysphagia 3 solids, continue thin liquids, medication smay be given whole in applesauce. Would still recommend full supervision to cue for slower rate of intake, check for right buccal pocketing and/or lingual reside, and take small single sips of drinks in order to reduce aspiration risk.  Pt is very hard of hearing, and although hearing aids were present, they had no charge. SLP located charging device and set up aids to charge. During evaluation, a voice amplifier with headphones was utilized to compensate for hearing loss. Pt appropriately indicated when he needed information to be repeated due to hearing with Min A question cues from clinician. He was oriented to self and place only. Many of his responses to questions were "I don't know". However, naming tasks were [facility]. His speech was fluent, but mild-moderately dysarthric. His intelligibility was reduced to ~70% today, with context of conversations mostly known. Pt demonstrated functional deficits in basic problem solving and short term memory. He indicated that "balance" was an acute deficits, but otherwise unable to identify other impairments or ADLs he needed assistance with without moderate cueing from SLP. On formal evaluation, pt scored 7/30 on SLUMS examination, which is consistent with findings of moderately severe neurocognitive deficits.  Recommend pt receive skilled ST services while inpatient to address dysphagia, dysarthria, and cognitive deficits in order to ensure diet safety/efficiency and maximize his functional communication, independent, and safety prior to discharge.    Skilled Therapeutic Interventions          Bedside swallow and cognitive-linguistic evaluations were administered and results were reviewed with pt (please see above  for details regarding the results).   SLP Assessment  Patient will need skilled Speech Lanaguage Pathology  Services during CIR admission    Recommendations  SLP Diet Recommendations: Dysphagia 3 (Mech soft);Thin Liquid Administration via: Cup;Straw Medication Administration: Whole meds with puree Supervision: Full supervision/cueing for compensatory strategies Compensations: Slow rate;Lingual sweep for clearance of pocketing;Small sips/bites Postural Changes and/or Swallow Maneuvers: Seated upright 90 degrees Oral Care Recommendations: Oral care BID Patient destination: Home Follow up Recommendations: 24 hour supervision/assistance;Home Health SLP    SLP Frequency 3 to 5 out of 7 days   SLP Duration  SLP Intensity  SLP Treatment/Interventions 2.5-3 weeks  Minumum of 1-2 x/day, 30 to 90 minutes  Cognitive remediation/compensation;Cueing hierarchy;Dysphagia/aspiration precaution training;Functional tasks;Patient/family education;Internal/external aids;Speech/Language facilitation    Pain Pain Assessment Pain Scale: 0-10 Pain Score: 0-No pain    SLP Evaluation Cognition Overall Cognitive Status: Impaired/Different from baseline Arousal/Alertness: Awake/alert Orientation Level: Oriented to person;Oriented to place Attention: Selective;Sustained Sustained Attention: Appears intact Selective Attention: Impaired Selective Attention Impairment: Verbal basic;Functional basic Memory: Impaired Memory Impairment: Decreased short term memory;Decreased recall of new information;Retrieval deficit Decreased Short Term Memory: Verbal basic;Functional basic Awareness: Impaired Awareness Impairment: Intellectual impairment;Emergent impairment Problem Solving: Impaired Problem Solving Impairment: Functional basic;Verbal basic Executive Function: Organizing;Initiating Organizing: Impaired Organizing Impairment: Verbal basic;Functional basic Initiating: Impaired Initiating Impairment: Verbal basic Behaviors: Impulsive Safety/Judgment: Impaired  Comprehension Auditory Comprehension Overall  Auditory Comprehension: Appears within functional limits for tasks assessed (impacted by hearing loss) Conversation: Simple Visual Recognition/Discrimination Discrimination: Not tested Reading Comprehension Reading Status: Not tested Expression Expression Primary Mode of Expression: Verbal Verbal Expression Overall Verbal Expression: Appears within functional limits for tasks assessed Written Expression Written Expression: Not tested Oral Motor Oral Motor/Sensory Function Overall Oral Motor/Sensory Function: Moderate impairment Facial ROM: Reduced right;Suspected CN VII (facial) dysfunction Facial Symmetry: Abnormal symmetry right Facial Strength: Reduced right Facial Sensation: Reduced right Lingual ROM: Reduced right Velum: Within Functional Limits Mandible: Within Functional Limits Motor Speech Overall Motor Speech: Impaired Respiration: Within functional limits Phonation: Normal Resonance: Within functional limits Articulation: Impaired Level of Impairment: Phrase Intelligibility: Intelligibility reduced Word: 75-100% accurate Phrase: 75-100% accurate Sentence: 50-74% accurate Conversation: 50-74% accurate Motor Planning: Witnin functional limits Motor Speech Errors: Not applicable Effective Techniques: Increased vocal intensity;Over-articulate  Care Tool Care Tool Cognition Expression of Ideas and Wants Expression of Ideas and Wants: Frequent difficulty - frequently exhibits difficulty with expressing needs and ideas (influenced by hearing loss)   Understanding Verbal and Non-Verbal Content Understanding Verbal and Non-Verbal Content: Usually understands - understands most conversations, but misses some part/intent of message. Requires cues at times to understand   Memory/Recall Ability *first 3 days only Memory/Recall Ability *first 3 days only: That he or she is in a hospital/hospital unit     Intelligibility: Intelligibility reduced Word: 75-100%  accurate Phrase: 75-100% accurate Sentence: 50-74% accurate Conversation: 50-74% accurate  Bedside Swallowing Assessment General Date of Onset: 11/19/20 Previous Swallow Assessment: MBS 11/29/20 Diet Prior to this Study: Dysphagia 1 (puree);Thin liquids Temperature Spikes Noted: No Respiratory Status: Room air History of Recent Intubation: Yes Length of Intubations (days): 1 days Date extubated: 11/21/20 Behavior/Cognition: Alert;Cooperative;Pleasant mood;Requires cueing Oral Cavity - Dentition: Dentures, top;Dentures, bottom Self-Feeding Abilities: Able to feed self Vision: Functional for self-feeding Patient Positioning: Upright in bed Baseline Vocal Quality: Normal Volitional Cough: Strong Volitional Swallow: Able to elicit  Oral Care Assessment Does patient have any of the following "high(er) risk" factors?: None of the above Does patient have any of the following "at risk"  factors?: Other - dysphagia Patient is AT RISK: Order set for Adult Oral Care Protocol initiated -  "At Risk Patients" option selected (see row information) Patient is LOW RISK: Follow universal precautions (see row information) Ice Chips Ice chips: Not tested Thin Liquid Thin Liquid: Within functional limits Presentation: Cup;Straw Nectar Thick Nectar Thick Liquid: Not tested Honey Thick Honey Thick Liquid: Not tested Puree Puree: Within functional limits Solid Solid: Impaired Presentation: Self Fed Oral Phase Functional Implications: Oral residue BSE Assessment Risk for Aspiration Impact on safety and function: Mild aspiration risk Other Related Risk Factors: Cognitive impairment;Previous CVA  Short Term Goals: Week 1: SLP Short Term Goal 1 (Week 1): Pt will verbally recall 1 safety precaution with Mod A cues for aids/strategies for recall. SLP Short Term Goal 2 (Week 1): Pt will orient to date and situation with Min A cue for use of aids/strategies. SLP Short Term Goal 3 (Week 1): Pt will  demonstrate ability to problem solve basic familiar tasks with Min A verbal and visual cues. SLP Short Term Goal 4 (Week 1): Pt will demonstrate awareness of functional errors with Mod A multimodal cues. SLP Short Term Goal 5 (Week 1): Pt will use compensatory strategies to increase speech intelligibility to 80% at sentence level with Mod A multimodal cues. SLP Short Term Goal 6 (Week 1): Pt will demonstrate efficient mastication and oral clearance of dysphagia 3 textures with Min A for use of compensatory swallow strategies (slow rate, small bites/sips, single sips of drink, clearance of pocketing).  Refer to Care Plan for Long Term Goals  Recommendations for other services: None   Discharge Criteria: Patient will be discharged from SLP if patient refuses treatment 3 consecutive times without medical reason, if treatment goals not met, if there is a change in medical status, if patient makes no progress towards goals or if patient is discharged from hospital.  The above assessment, treatment plan, treatment alternatives and goals were discussed and mutually agreed upon: by patient  Arbutus Leas 12/03/2020, 12:32 PM

## 2020-12-03 NOTE — Progress Notes (Signed)
Inpatient Rehabilitation  Patient information reviewed and entered into eRehab system by Aurianna Earlywine M. Shikara Mcauliffe, M.A., CCC/SLP, PPS Coordinator.  Information including medical coding, functional ability and quality indicators will be reviewed and updated through discharge.    

## 2020-12-03 NOTE — Progress Notes (Signed)
[facility] Individual Statement of Services  Patient Name:  Cesar [name]  Date:  12/03/2020  Welcome to the [facility].  Our goal is to provide you with an individualized program based on your diagnosis and situation, designed to meet your specific needs.  With this comprehensive rehabilitation program, you will be expected to participate in at least 3 hours of rehabilitation therapies Monday-Friday, with modified therapy programming on the weekends.  Your rehabilitation program will include the following services:  Physical Therapy (PT), Occupational Therapy (OT), Speech Therapy (ST), 24 hour per day rehabilitation nursing, Therapeutic Recreaction (TR), Neuropsychology, Care Coordinator, Rehabilitation Medicine, Nutrition Services, Pharmacy Services and Other  Weekly team conferences will be held on Wednesday to discuss your progress.  Your Inpatient Rehabilitation Care Coordinator will talk with you frequently to get your input and to update you on team discussions.  Team conferences with you and your family in attendance may also be held.  Expected length of stay: 8-12 Days  Overall anticipated outcome: Min A to Mod I  Depending on your progress and recovery, your program may change. Your Inpatient Rehabilitation Care Coordinator will coordinate services and will keep you informed of any changes. Your Inpatient Rehabilitation Care Coordinator's name and contact numbers are listed  below.  The following services may also be recommended but are not provided by the [facility]:    [place] will be made to provide these services after discharge if needed.  Arrangements include referral to agencies that provide these services.  Your insurance has been verified to be:  Medicare A&B Your primary doctor is:  Orpah Melter, MD  Pertinent information  will be shared with your doctor and your insurance company.  Inpatient Rehabilitation Care Coordinator:  Erlene Quan, [place] or [phone]  Information discussed with and copy given to patient by: Dyanne Iha, 12/03/2020, 10:31 AM

## 2020-12-03 NOTE — Progress Notes (Signed)
Inpatient Rehabilitation Care Coordinator Assessment and Plan Patient Details  Name: Cesar [name] MRN: [id number] Date of Birth: 1928-11-04  Today's Date: 12/03/2020  Hospital Problems: Active Problems:   Traumatic subdural hematoma [facility])  Past Medical History:  Past Medical History:  Diagnosis Date  . AAA (abdominal aortic aneurysm) ([place])   . CKD (chronic kidney disease), stage III (HCC)    baseline Cr 1.4-1.5 in 2016-2017 by PCP)  . CVA (cerebral infarction)   . Depression   . DVT (deep venous thrombosis) ([place]) 2006  . H/O blood clots   . Hematuria   . HOH (hard of hearing)   . Hypercholesterolemia   . OSA (obstructive sleep apnea)    intol to CPAP  . Permanent atrial fibrillation ([place])   . Prostate enlargement   . Pulmonary nodules   . 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 Buena Irish and Dr. Roxy Manns  . Thoracic ascending aortic aneurysm ([place])    4.5cm in 11/2018, stable since 2016  . TIA (transient ischemic attack)    Past Surgical History:  Past Surgical History:  Procedure Laterality Date  . BURR HOLE Left 11/21/2020   Procedure: LEFT Side BURR HOLES,;  Surgeon: [name], MD;  Location: [place];  Service: Neurosurgery;  Laterality: Left;  . EMBOLECTOMY Left 07/02/2016   Procedure: EMBOLECTOMY LEFT FEMORAL ARTERY;  Surgeon: [name], MD;  Location: [place];  Service: Vascular;  Laterality: Left;  Marland Kitchen MESENTERIC ARTERY BYPASS N/A 08/06/2019   Procedure: SUPERIOR MESENTERIC ARTERY THROMBECTOMY;  Surgeon: [name], MD;  Location: [place];  Service: Vascular;  Laterality: N/A;  . NM [place]  08/24/2002   markedly positive,subtle lateral ischemia  . PATCH ANGIOPLASTY  08/06/2019   Procedure: Patch Angioplasty of superior messenteric artery with RIGHT femoral vein;  Surgeon: [name], MD;  Location: [facility];  Service: Vascular;;  . PROSTATE SURGERY     . RIGHT HEART CATH AND CORONARY ANGIOGRAPHY N/A 10/21/2020   Procedure: RIGHT HEART CATH AND CORONARY ANGIOGRAPHY;  Surgeon: [name], MD;  Location: [facility] CV LAB;  Service: Cardiovascular;  Laterality: N/A;  . TEE WITHOUT CARDIOVERSION N/A 10/22/2020   Procedure: TRANSESOPHAGEAL ECHOCARDIOGRAM (TEE);  Surgeon: [name], MD;  Location: [facility];  Service: Open Heart Surgery;  Laterality: N/A;  . TRANSCATHETER AORTIC VALVE REPLACEMENT, TRANSFEMORAL Right 10/22/2020   Procedure: TRANSCATHETER AORTIC VALVE REPLACEMENT, TRANSFEMORAL;  Surgeon: [name], MD;  Location: [place];  Service: Open Heart Surgery;  Laterality: Right;  . US ECHOCARDIOGRAPHY  01/06/2012   mod. AOV ca+,mild to mod. AS,mod mostly posterior MAC-mild MR   Social History:  reports that he quit smoking about 68 years ago. His smoking use included cigarettes. He has a 10.00 pack-year smoking history. He has never used smokeless tobacco. He reports previous alcohol use. He reports that he does not use drugs.  Family / Support Systems Patient Roles: Spouse Spouse/Significant Other: [name] Children: Rhuona (Dauhgter), Marcelino Scot (Son) Anticipated Caregiver: Gwenyth Allegra Ability/Limitations of Caregiver: Can provide up to [employer] A Caregiver Availability: 24/7  Social History Preferred language: English Religion: Methodist Read: Yes Write: Yes Employment Status: Disabled Public relations account executive Issues: n/a Guardian/Conservator: Milderd Meager or [name]   Abuse/Neglect Abuse/Neglect Assessment Can Be Completed: Yes Physical Abuse: Denies Verbal Abuse: Denies Sexual Abuse: Denies Exploitation of patient/patient's resources: Denies Self-Neglect: Denies  Emotional Status Pt's affect,  behavior and adjustment status: hx of depression Recent Psychosocial Issues: no Psychiatric History: no Substance Abuse History: no  Patient / Family Perceptions, Expectations & Goals Pt/Family  understanding of illness & functional limitations: yes Premorbid pt/family roles/activities: Indepedent with AD with mobility, some assistance with ADLS, stairs and cognition Anticipated changes in roles/activities/participation: Will need assistance at discharge Pt/family expectations/goals: Min A to [place]: None Premorbid Home Care/DME Agencies: Other (Comment) (Hearing aid, Rolling Walker, BSC, Single Point cane) Transportation available at discharge: family able to transport Resource referrals recommended: Neuropsychology (hx of depression)  Discharge Planning Living Arrangements: Spouse/significant other Support Systems: Spouse/significant other Type of Residence: Private residence (2 level home , 2 steps to enter (able to stay on main level)) Insurance Resources: Chartered certified accountant Resources: Family Support,Social Security,SSD Financial Screen Referred: No Living Expenses: Own Money Management: Patient,Spouse Does the patient have any problems obtaining your medications?: No Home Management: Some assistance from spouse Patient/Family Preliminary Plans: Spouse plans to continue to assist in home with medications Care Coordinator Anticipated Follow Up Needs: HH/OP DC Planning Additional Notes/Comments: HAS HEARING AIDS, Expected length of stay: 8-12 Days  Clinical Impression SW met with patient, spouse and son at bedside, introduced self, explained role and addressed questions and concerns. Spouse assisting patient with urinal at bedside. No additional questions and concerns. Patient to discharge home with family, 2 level home and 2 steps to enter. Patient able to be set up on main level. SW will continue to follow up with questions and concerns.   Dyanne Iha 12/03/2020, 12:41 PM

## 2020-12-04 DIAGNOSIS — S065X0D Traumatic subdural hemorrhage without loss of consciousness, subsequent encounter: Secondary | ICD-10-CM | POA: Diagnosis not present

## 2020-12-04 MED ORDER — ASPIRIN EC 81 MG PO TBEC
81.0000 mg | DELAYED_RELEASE_TABLET | Freq: Every day | ORAL | Status: DC
  Administered 2020-12-06 – 2020-12-19 (×14): 81 mg via ORAL
  Filled 2020-12-04 (×14): qty 1

## 2020-12-04 NOTE — Progress Notes (Signed)
PROGRESS NOTE   Subjective/Complaints:  No issues overnite per pt, has pocket talker on wife at bedside , is pleased with pt progress  ROS- neg CP SOB, N/V/D  Objective:   No results found. Recent Labs    12/03/20 0509  WBC 7.6  HGB 13.3  HCT 39.3  PLT 108*   Recent Labs    12/03/20 0509  NA 138  K 4.5  CL 104  CO2 25  GLUCOSE 100*  BUN 31*  CREATININE 1.31*  CALCIUM 8.8*    Intake/Output Summary (Last 24 hours) at 12/04/2020 0277 Last data filed at 12/04/2020 0300 Gross per 24 hour  Intake 400 ml  Output 550 ml  Net -150 ml     Pressure Injury 10/22/20 Buttocks Left;Mid Stage 2 -  Partial thickness loss of dermis presenting as a shallow open injury with a red, pink wound bed without slough. (Active)  10/22/20 1447  Location: Buttocks  Location Orientation: Left;Mid  Staging: Stage 2 -  Partial thickness loss of dermis presenting as a shallow open injury with a red, pink wound bed without slough.  Wound Description (Comments):   Present on Admission: Yes    Physical Exam: Vital Signs Blood pressure (!) 150/78, pulse 72, temperature 97.9 F (36.6 C), resp. rate 16, height 6\' 1"  (1.854 m), weight 84.6 kg, SpO2 100 %.   General: No acute distress Mood and affect are appropriate Heart: Regular rate and rhythm no rubs murmurs or extra sounds Lungs: Clear to auscultation, breathing unlabored, no rales or wheezes Abdomen: Positive bowel sounds, soft nontender to palpation, nondistended Extremities: No clubbing, cyanosis, or edema Skin: scalp wound CDI, staples in place Neurologic: Cranial nerves II through XII intact, motor strength is 5/5 in bilateral deltoid, bicep, tricep, grip, hip flexor, knee extensors, ankle dorsiflexor and plantar flexor Sensory exam normal sensation to light touch and proprioception in bilateral upper and lower extremities Cerebellar exam normal finger to nose to finger as well as  heel to shin in bilateral upper and lower extremities Musculoskeletal: Full range of motion in all 4 extremities. No joint swelling   Musculoskeletal: Full range of motion in all 4 extremities. No joint swelling   Assessment/Plan: 1. Functional deficits which require 3+ hours per day of interdisciplinary therapy in a comprehensive inpatient rehab setting.  Physiatrist is providing close team supervision and 24 hour management of active medical problems listed below.  Physiatrist and rehab team continue to assess barriers to discharge/monitor patient progress toward functional and medical goals  Care Tool:  Bathing    Body parts bathed by patient: Right arm,Left arm,Chest,Abdomen,Right upper leg,Left upper leg,Right lower leg,Left lower leg,Face   Body parts bathed by helper: Front perineal area,Buttocks     Bathing assist Assist Level: Moderate Assistance - Patient 50 - 74%     Upper Body Dressing/Undressing Upper body dressing   What is the patient wearing?: Pull over shirt    Upper body assist Assist Level: Minimal Assistance - Patient > 75%    Lower Body Dressing/Undressing Lower body dressing      What is the patient wearing?: Pants,Incontinence brief     Lower body assist Assist for lower body  dressing: Moderate Assistance - Patient 50 - 74%     Toileting Toileting    Toileting assist Assist for toileting: Moderate Assistance - Patient 50 - 74%     Transfers Chair/bed transfer  Transfers assist     Chair/bed transfer assist level: Moderate Assistance - Patient 50 - 74%     Locomotion Ambulation   Ambulation assist      Assist level: 2 helpers Assistive device: Walker-rolling Max distance: 85   Walk 10 feet activity   Assist     Assist level: Moderate Assistance - Patient - 50 - 74% Assistive device: Walker-rolling   Walk 50 feet activity   Assist    Assist level: 2 helpers Assistive device: Walker-rolling    Walk 150 feet  activity   Assist Walk 150 feet activity did not occur: Safety/medical concerns         Walk 10 feet on uneven surface  activity   Assist Walk 10 feet on uneven surfaces activity did not occur: Safety/medical concerns         Wheelchair     Assist Will patient use wheelchair at discharge?: No Type of Wheelchair: Manual    Wheelchair assist level: Minimal Assistance - Patient > 75% Max wheelchair distance: 150    Wheelchair 50 feet with 2 turns activity    Assist        Assist Level: Minimal Assistance - Patient > 75%   Wheelchair 150 feet activity     Assist      Assist Level: Minimal Assistance - Patient > 75%   Blood pressure (!) 150/78, pulse 72, temperature 97.9 F (36.6 C), resp. rate 16, height 6\' 1"  (1.854 m), weight 84.6 kg, SpO2 100 %.  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 Status post bur hole 11/21/2020 -patient may shower -ELOS/Goals: 8-12 days, min assist PT, min-sup with OT and mod I with cognition and speech/swallowing  2. Antithrombotics: -DVT/anticoagulation:Subcutaneous heparin -antiplatelet therapy: N/A 3. Pain Management:Tylenol as needed 4. Mood:Provide emotional support -antipsychotic agents: N/A 5. Neuropsych: This patientisnot quitecapable of making decisions onhisown behalf. 6. Skin/Wound Care:Routine skin checks -can remove staples today 7. Fluids/Electrolytes/Nutrition:Routine in and outs with follow-up chemistries 8. Seizure disorder. Depakote 250 mg every 6 hours 9. Atrial fibrillation. Cardiac rate controlled. Eliquis on hold due to SDH x4 to 6 weeks. Reg rhythm today on exam. Vitals:   12/03/20 2209 12/04/20 0505  BP: 118/70 (!) 150/78  Pulse: 73 72  Resp: 16 16  Temp: 97.8 F (36.6 C) 97.9 F (36.6  C)  SpO2: 100%   rate controlled 3/2 10. CKD stage III. Follow-up chemistries 11. Ascending thoracic aortic aneurysm. Follow-up outpatient Dr. Caffie Pinto 12. 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 Follow-up outpatient cardiology services Dr. [name] 13. BPH. Proscar 5 mg daily. Check PVR 14. History of OSA. Patient not on CPAP. 15. Dysphagia. Dysphagia #1 thin liquids.  -pt more alert today, should be able to advance diet soon.  -Follow-up speech therapy    LOS: 2 days A FACE TO [place] E Kirsteins 12/04/2020, 8:52 AM

## 2020-12-04 NOTE — Progress Notes (Signed)
Patient ID: DIANA ARMIJO, male   DOB: Jan 30, 1929, 87 y.o.   MRN: [id number] Team Conference Report to Patient/Family  Team Conference discussion was reviewed with the patient and caregiver, including goals, any changes in plan of care and target discharge date.  Patient and caregiver express understanding and are in agreement.  The patient has a target discharge date of 12/24/20.  Dyanne Iha 12/04/2020, 1:41 PM

## 2020-12-04 NOTE — Patient Care Conference (Signed)
Inpatient RehabilitationTeam Conference and Plan of Care Update Date: 12/04/2020   Time: 10:26 AM    Patient Name: Cesar [name]      Medical Record Number: [id number]  Date of Birth: Aug 15, 1929 Sex: Male         Room/Bed: 4W26C/4W26C-01 Payor Info: Payor: MEDICARE / Plan: MEDICARE PART A AND B / Product Type: *No Product type* /    Admit Date/Time:  12/02/2020  3:30 PM  Primary Diagnosis:  <principal problem not specified>  Hospital Problems: Active Problems:   Traumatic subdural hematoma [facility])    Expected Discharge Date: Expected Discharge Date: 12/24/20  Team Members Present: Physician leading conference: Dr. Alysia Penna Care Coodinator Present: Dorien Chihuahua, RN, BSN, CRRN;Christina [place], [place] Nurse Present: Rayne Du, LPN PT Present: Estevan Ryder, PT OT Present: Clyda Greener, OT SLP Present: Jettie Booze, CF-SLP PPS Coordinator present : Gunnar Fusi, Novella Olive, PT     Current Status/Progress Goal Weekly Team Focus  Bowel/Bladder   Continent of bowl and bladder lbm on acute floor 2/27  Maintain continence  toileting  PRN   Swallow/Nutrition/ Hydration   dys 3 textures, thin liquids, impulsive with intake, pocketing improving  Supervision  carryover swallow strategies, tolerance of diet, trials regular as appropriate   ADL's   Supervision for UB selfcare with mod assist for LB selfcare sit to stand.  Mod assist for functional transfers as well with use of the RW for support.  Expressive deficits as well as decreased awareness and some impulsivity noted.  min guard to supervision  selfcare retraining, transfer training, DME education, pt education, neuromuscular re-education, balance retraining   Mobility   CGA/ MinA bed mobility, ModA STS/transfers (very poor safety awareness), 2 helps for gait up to 62ft ([place] + wc follow using RW), ModA steps x2 BHR, MIna wc mobility  SPV/CGA overall  safety awareness, funcitonal transfers, gait progressions,  functional strength   Communication   mild-mod dysarthria, very hard of hearing  Supervision  speech intelligibility strategies   Safety/Cognition/ Behavioral Observations  Mod-Max  Supervision-Min  basic familiar problem solving and safety awareness, intellectual awareness, short term recall/carryover   Pain   no c/o pain at this time  maintain a pain goal below 3      Skin   incision sites on head clean and dry  No new breakdown  Assess skin qshift and PRN     Discharge Planning:  Goal to d/c home with spouse and son to provide care up to [place], 24/7. 2 level home, 2 steps to enter   Team Discussion: SDH with labile BP and off Eliquis for several weeks post event; expect to restart after discharge. HOH and unable to use hearing aides. Progress limited by hearing impairment, mild dysarthria, disorientation and impulsivity. Patient requires cues for problem solving, safety, awareness due to motor planning issues and apraxia with anterior/posterior lean when standing.  Patient on target to meet rehab goals: yes, currently mod assist for lower body care and mod assist for transfers,  With supervision - CGA/min guard assist goals set for discharge.   *See Care Plan and progress notes for long and short-term goals.   Revisions to Treatment Plan:   Teaching Needs: Transfers, safety management, toileting, medications, etc.   Current Barriers to Discharge: Decreased caregiver support, Home enviroment access/layout and hearing loss; hearing aides not in use at this time  Possible Resolutions to Barriers: Family education Audiology referral for follow up on hearing aides     Medical  Summary Current Status: very hard of hearing, + apraxia, poor safety awareness, scalp incision healing well  Barriers to Discharge: Wound care;Medical stability;Other (comments)  Barriers to Discharge Comments: very hard of hearing Possible Resolutions to Barriers/Weekly Focus: d/c staples, get wife to  bring in hearing aides, improve endurance   Continued Need for Acute Rehabilitation Level of Care: The patient requires daily medical management by a physician with specialized training in physical medicine and rehabilitation for the following reasons: Direction of a multidisciplinary physical rehabilitation program to maximize functional independence : Yes Medical management of patient stability for increased activity during participation in an intensive rehabilitation regime.: Yes Analysis of laboratory values and/or radiology reports with any subsequent need for medication adjustment and/or medical intervention. : Yes   I attest that I was present, lead the team conference, and concur with the assessment and plan of the team.   Dorien Chihuahua B 12/04/2020, 3:20 PM

## 2020-12-04 NOTE — Progress Notes (Signed)
Speech Language Pathology Daily Session Note  Patient Details  Name: Cesar [name] MRN: [id number] Date of Birth: Feb 21, 1929  Today's Date: 12/04/2020 SLP Individual Time: 0729-0825 SLP Individual Time Calculation (min): 56 min  Short Term Goals: Week 1: SLP Short Term Goal 1 (Week 1): Pt will verbally recall 1 safety precaution with Mod A cues for aids/strategies for recall. SLP Short Term Goal 2 (Week 1): Pt will orient to date and situation with Min A cue for use of aids/strategies. SLP Short Term Goal 3 (Week 1): Pt will demonstrate ability to problem solve basic familiar tasks with Min A verbal and visual cues. SLP Short Term Goal 4 (Week 1): Pt will demonstrate awareness of functional errors with Mod A multimodal cues. SLP Short Term Goal 5 (Week 1): Pt will use compensatory strategies to increase speech intelligibility to 80% at sentence level with Mod A multimodal cues. SLP Short Term Goal 6 (Week 1): Pt will demonstrate efficient mastication and oral clearance of dysphagia 3 textures with Min A for use of compensatory swallow strategies (slow rate, small bites/sips, single sips of drink, clearance of pocketing).  Skilled Therapeutic Interventions: Pt was seen for skilled ST targeting dysphagia and cognitive-linguistic goals. Hearing aids were not charged, therefore, voice amplifier utilized to facilitated better hearing. Pt required Moderate multimodal cues for problem solving and following basic directions to assist with bed mobility. Although he verbally recalled "call don't fall" when asked about safety precautions, he required Min A verbal and visual cues for recall of how to request assistance with call bell. Pt was independently oriented to month, but required Min A verbal and visual cues with use of calendar aid to orient to day of the week and date. He also did not recall that he was in the hospital, requiring total A from SLP. Moderate verbal and visual cues provided for problem  solving in order to interpret a basic weekly weather forecast with SLP. In functional conversation, pt expressed that the purpose of therapies were "like [place]" to him (as in he doesn't understand them), therefore SLP provided explanation and written handout to clarify main targets and differences between disciplines (PT/OT/ST). Of note, pt's speech was far more intelligible today, minimal cues required for clarification of messages and he was 90% intelligible in conversation. During pt's consumption of recently upgraded Dysphagia 3 (mechanical soft) texture breakfast with thin liquids, no overt s/sx aspiration observed. However, pt was very impulsive with intake. Mod-Max verbal cueing provided for use of slower rate, and Min A verbal and visual cues for use of strategies to clear Min right buccal pocketing of solids. Minimal right anterior loss of solids also noted during intake. Recommend pt continue current diet with full supervision to ensure use of compensatory and safe swallowing strategies.  Pt left sitting upright in bed with alarm set and needs within reach. Continue per current plan of care.      Pain Pain Assessment Pain Scale: 0-10 Pain Score: 0-No pain  Therapy/Group: Individual Therapy  Arbutus Leas 12/04/2020, 7:20 AM

## 2020-12-04 NOTE — Progress Notes (Signed)
Occupational Therapy Session Note  Patient Details  Name: Cesar [name] MRN: [id number] Date of Birth: [birth year]  Today's Date: 12/04/2020 OT Individual Time: 0904-1003 OT Individual Time Calculation (min): 59 min    Short Term Goals: Week 1:  OT Short Term Goal 1 (Week 1): Pt will complete LB bathing sit to stand with min assist for two consecutive sessions. OT Short Term Goal 2 (Week 1): Pt will complete LB dressing with min assist sit to stand for two consecutive sessions. OT Short Term Goal 3 (Week 1): Pt will complete toilet transfers with min assist using the RW for support.  Skilled Therapeutic Interventions/Progress Updates:    Pt completed supine to sit EOB with supervision and worked on donning his pants with min assist sit to stand as well as velcro shoes with setup.  He was oriented to place, but not month.  He was also correct on day of the week as well.  He performed stand pivot transfer to the wheelchair with use of the RW and mod assist with complete combing her hair.  Next, therapist took him to the therapy gym where he was able to work on simple 4-5 piece block design in sitting.  He was unable to reproduce block design and required max assist.  Took him down to the tub/shower room for practice with tub transfers.  He needed mod assist for stepping over the edge of the tub with use of the grab bars for support and for sitting on the seat.  He was able to step out at the same level.  Finished session with completion of 5 mins using the UE Ergonometer with resistance on level 10 for increased RUE coordination ant strength.  RPMs were maintained at 15-20 with min demonstrational sequencing for initiation and completion.  Finished session with return to the room and pt handed off to PT for next session.    Therapy Documentation Precautions:  Precautions Precautions: Fall Precaution Comments: HOH, expressive difficulty Restrictions Weight Bearing Restrictions: No Other  Position/Activity Restrictions: HOH, does have hearing aides  Pain: Pain Assessment Pain Scale: 0-10 Pain Score: 0-No pain ADL: See Care Tool Section for some details of mobility and selfcare  Therapy/Group: Individual Therapy  Lasandra Batley OTR/L 12/04/2020, 10:59 AM

## 2020-12-04 NOTE — Progress Notes (Signed)
Patient has been cleared by neurosurgery to resume low-dose aspirin 12/06/2020.  Chronic Eliquis will currently remain on hold.

## 2020-12-04 NOTE — Progress Notes (Signed)
Physical Therapy Session Note  Patient Details  Name: Cesar [name] MRN: [id number] Date of Birth: 01/30/1929  Today's Date: 12/04/2020 PT Individual Time: 1000-1100 PT Individual Time Calculation (min): 60 min   Short Term Goals: Week 1:  PT Short Term Goal 1 (Week 1): Patient will complete bed mobility with supervision PT Short Term Goal 2 (Week 1): Patient will transfer bed <> wc with MinA and LRAD PT Short Term Goal 3 (Week 1): Patient will ambulate 68ft with LRAD and MinA PT Short Term Goal 4 (Week 1): Patient will negotiate 2 steps with 1 HR and MinA  Skilled Therapeutic Interventions/Progress Updates:    Patient seated in w/c and completing OT session upon PT arrival. Patient alert and agreeable to PT session. Patient with no pain complaint throughout session. Hearing aids provided by wife and donned prior to session. Pt still requires increased volume.   Therapeutic Activity: Bed Mobility: Pt performs sit-->supine at end of session with CGA and vc for initiation and technique. Is able to perform small bridging to complete positioning in bed for improved comfort with no physical assist.  Transfers: Patient performed STS and SPVT transfers throughout session with Mod A and increasing to very light Min A for power up from seat. Pt guided in blocked practice of use of BUE to push from w/c armrests along with forward lean for improved independence in rising to stand. SPVT performed with CGA/ Min A for balance to from various surfaces and heights throughout day room including mat table and NuStep/. Continued vc and tc throughout for progression of BUE back to w/c armrests prior to return to sit in order to control pt's descent to sit.   Pt guided in use of NuStep L4 x66min and L3 x 91min maintaining speed >45 steps/ min and METs above 1.6 throughout. Pt ended bout on NuStep d/t fatigue.   Gait Training:  Patient ambulated 25' x1 and 93' x1 using RW CGA and intermittent need for Min A to  maintain balance. VC provided for increased step height/ length, upright posture, improved proximity to walker.   Neuromuscular Re-ed: NMR facilitated during session with focus on standing static balance.  Pt guided in reach for targets outside of BOS to same side and across midline as well as in BUE self perturbations using 3# weighted ball with horizontal plane AP arm movements. Pt has tendency to maintain BOS over heels and is noted to lose balance bkwd over heels requiring Min A to maintain balance with perturbations. NMR performed for improvements in motor control and coordination, balance, sequencing, judgement, and self confidence/ efficacy in performing all aspects of mobility at highest level of independence.   Patient supine in bed at end of session with brakes locked, bed alarm set, and all needs within reach. Wife present in room.     Therapy Documentation Precautions:  Precautions Precautions: Fall Precaution Comments: HOH, expressive difficulty Restrictions Weight Bearing Restrictions: No Other Position/Activity Restrictions: HOH, does have hearing aides   Therapy/Group: Individual Therapy  Alger Simons 12/04/2020, 5:27 PM

## 2020-12-05 DIAGNOSIS — S065X0D Traumatic subdural hemorrhage without loss of consciousness, subsequent encounter: Secondary | ICD-10-CM | POA: Diagnosis not present

## 2020-12-05 NOTE — IPOC Note (Signed)
Overall Plan of Care [facility]) Patient Details Name: Cesar [name] MRN: [id number] DOB: [birth year]  Admitting Diagnosis: <principal problem not specified>  Hospital Problems: Active Problems:   Traumatic subdural hematoma ([place])     Functional Problem List: Nursing Safety,Sensory,Skin Integrity,Endurance,Medication Management,Nutrition  PT Balance,Behavior,Endurance,Motor,Perception,Safety,Sensory,Skin Integrity  OT Balance,Cognition,Motor,Safety,Perception  SLP Cognition,Linguistic,Nutrition  TR         Basic ADL's: OT Eating,Grooming,Bathing,Dressing,Toileting     Advanced  ADL's: OT       Transfers: PT Bed Mobility,Bed to Chair,Car,Furniture  OT Toilet,Tub/Shower     Locomotion: PT Ambulation,Wheelchair Mobility,Stairs     Additional Impairments: OT Fuctional Use of Upper Extremity  SLP Swallowing,Communication,Social Cognition expression Problem Solving,Memory,Awareness  TR      Anticipated Outcomes Item Anticipated Outcome  Self Feeding independent  Swallowing  Supervision A   Basic self-care  min guard  Toileting  min guard   Bathroom Transfers min guard  Bowel/Bladder  To remain continent of bowel/bladder while in rehab  Transfers  spv  Locomotion  CGA  Communication  Supervision A  Cognition  Supervision A  Pain  no pain  Safety/Judgment  able to call for help and follow plan of care   Therapy Plan: PT Intensity: Minimum of 1-2 x/day ,45 to 90 minutes PT Frequency: 5 out of 7 days PT Duration Estimated Length of Stay: 2-2.5weeks OT Intensity: Minimum of 1-2 x/day, 45 to 90 minutes OT Frequency: 5 out of 7 days OT Duration/Estimated Length of Stay: 17-21 days SLP Intensity: Minumum of 1-2 x/day, 30 to 90 minutes SLP Frequency: 3 to 5 out of 7 days SLP Duration/Estimated Length of Stay: 2.5-3 weeks   Due to the current state of emergency, patients may not be receiving their 3-hours of Medicare-mandated therapy.   Team  Interventions: Nursing Interventions Patient/Family Education,Dysphagia/Aspiration Precaution Training,Discharge Planning,Skin Care/Wound Management,Medication Management,Disease Management/Prevention,Cognitive Remediation/Compensation  PT interventions Ambulation/gait training,Discharge planning,Functional mobility training,Psychosocial support,Therapeutic Activities,Visual/perceptual remediation/compensation,Balance/vestibular training,Disease management/prevention,Neuromuscular re-education,Skin care/wound Heritage manager propulsion/positioning,Cognitive remediation/compensation,DME/adaptive equipment instruction,Pain management,Splinting/orthotics,UE/LE Strength taining/ROM,Community reintegration,Functional electrical stimulation,Patient/family education,Stair training,UE/LE Coordination activities  OT Interventions Balance/vestibular training,Discharge planning,Pain management,Self Care/advanced ADL retraining,Therapeutic Activities,UE/LE Coordination activities,Therapeutic Exercise,Patient/family education,Functional mobility training,DME/adaptive equipment instruction,Neuromuscular re-education,UE/LE Strength taining/ROM,Psychosocial support,Community reintegration,Cognitive remediation/compensation  SLP Interventions Cognitive remediation/compensation,Cueing hierarchy,Dysphagia/aspiration precaution training,Functional tasks,Patient/family education,Internal/external aids,Speech/Language facilitation  TR Interventions    SW/CM Interventions Discharge Planning,Psychosocial Support,Patient/Family Education,Disease Management/Prevention   Barriers to Discharge MD  Medical stability and severe hearing loss   Nursing      PT Decreased caregiver support,Lack of/limited family support    OT      SLP      SW       Team Discharge Planning: Destination: PT-Home ,OT- Home , SLP-Home Projected Follow-up: PT-Home health PT,24 hour supervision/assistance, OT-  Home health  OT,24 hour supervision/assistance, SLP-24 hour supervision/assistance,Home Health SLP Projected Equipment Needs: PT- , OT- To be determined, SLP-  Equipment Details: PT-owns a RW, OT-  Patient/family involved in discharge planning: PT- Patient,Family member/caregiver,  OT-Patient,Family Midwife, SLP-Patient  MD ELOS: 18-21d Medical Rehab Prognosis:  Fair Assessment:  87 year old right-handed male with history of atrial fibrillation maintained on Eliquis, CKD stage III, CVA, seizure disorder maintained on Depakote, history of DVT, hyperlipidemia, OSA not on CPAP, ascending thoracic aortic aneurysm followed by Dr. Caffie [name], severe aortic stenosis status post TAVR procedure 10/22/2020, nonobstructive CAD. Per chart review lives with spouse. Two-level home bed and bath main level 2 steps to entry. Independent with assistive device. He does have a daughter with good support. Presented 11/19/2020 after increasing weakness with  frequent falls and altered mental status. Admission chemistries unremarkable except glucose 123, creatinine 1.34, hemoglobin 12.3, valproic acid level 26. Cranial CT scan showed large chronic left subdural hematoma. Patient underwent left-sided bur hole x2 placement of subdural drain for evacuation of subdural hematoma 11/21/2020 per Dr. Duffy [name]. He remains on Depakote as prior to admission Follow-up cranial CT scan 11/22/2020 showed decompressed subdural hematoma on the left essentially resolved. He was cleared to begin subcutaneous heparin for DVT prophylaxis 11/25/2020. His chronic Eliquis remained on hold for approximately 6 weeks secondary to SDH. Close monitoring of blood pressure initially on Cardene drip which is since been discontinued. On 11/26/2020 patient with increasing right side weakness and aphasia. Neurology consulted with MRI/MRA completed showing unchanged thickness of left convexity subdural hematoma. No acute ischemia. MRA unremarkable. 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 Chronic small vessel ischemic changes of the white matter. His cranial CT scan was completed for follow-up 11/30/2020 showing decreased conspicuity of left cerebral convexity subdural collection and tiny foci of pneumocephalus. 2-3 mm rightward midline shift. EEG completed showing no seizure activity however suggestive of cortical dysfunction left hemisphere likely secondary to underlying hematoma as well as moderate diffuse encephalopathy. Currently maintained on a dysphagia #1 thin liquid diet   See Team Conference Notes for weekly updates to the plan of care

## 2020-12-05 NOTE — Discharge Summary (Signed)
Physician Discharge Summary  Patient ID: Cesar [name] MRN: [id number] DOB/AGE: 1929/06/26 87 y.o.  Admit date: 11/19/2020 Discharge date: 12/05/2020  Admission Diagnoses:  Chronic Subdural hematoma  Discharge Diagnoses:  Same Principal Problem:   SDH (subdural hematoma) (HCC) Active Problems:   Hypercholesterolemia   CKD (chronic kidney disease), stage III (HCC)   Atrial fibrillation (HCC)   Frequent falls   Subdural bleeding (HCC)   Seizures (HCC)   History of CVA (cerebrovascular accident)   History of DVT (deep vein thrombosis)   OSA (obstructive sleep apnea)   Thrombocytopenia ([place])   Aphasia   Right sided weakness   Discharged Condition: Stable  Hospital Course:  Cesar [name] is a 87 y.o. male with history of epilepsy, coronary artery disease status post TAVR who was on aspirin and Eliquis but developed increasing difficulties with walking with stumbles and falls noted over several weeks.  He was brought into the hospital as he was no longer able to ambulate and was noncommunicative as well as very drowsy.  He was found to have a large left-sided chronic subdural hematoma with loculations and significant mass-effect.  2 days after his admission, to allow his Eliquis anticoagulation effect to be extinguished, he underwent bur hole drainage of this subdural hematoma with a subdural drain placement.  His postop day #1 CT scan showed excellent evacuation and his drain was removed on postopday #2.  He had rapid improvement following the surgery with his preoperative hemiparesis and aphasia.   However, on postop day #4 he developed some worsening of his right-sided strength in his speech Subsequent imaging including MRIs showed no stroke and his small residual subdural continue to improve.  EEGs showed no obvious seizures.  His Depakote level was subtherapeutic and he was reloaded.  Neurology and internal medicine was reconsulted given some of his mild metabolic derangements.  His  right-sided strength normalized, with no pronator drift.  And he remained alert and oriented.  However, he still struggled with speech output.  Physical therapy and Occupational Therapy as well as speech therapy was consulted and rehab was recommended.  He was deemed ready for discharge on 12/02/2020 when a bed was ready.  Treatments: Surgery -left bur holes for drainage of chronic subdural hematoma  Discharge Exam: Blood pressure (!) 157/64, pulse 65, temperature 98 F (36.7 C), temperature source Oral, resp. rate 20, height 6\' 1"  (1.854 m), weight 89.4 kg, SpO2 98 %. Awake, alert, oriented Word finding difficulty, oriented to name, month but unable to converse. CN grossly intact 5/5 BUE/BLE, no drift Wound c/d/i  Disposition: Discharge disposition: 02-Transferred to [facility]        Allergies as of 12/02/2020      Reactions   Flomax [tamsulosin Hcl] Other (See Comments)   Not able to urinate   Levetiracetam Other (See Comments)   Unknown reaction      Medication List    ASK your doctor about these medications   acetaminophen 500 MG tablet Commonly known as: TYLENOL Take 500 mg by mouth every 8 (eight) hours as needed for mild pain.   amoxicillin 500 MG tablet Commonly known as: AMOXIL Take 4 tablets (2000 mg) one hour prior to all dental visits.   apixaban 5 MG Tabs tablet Commonly known as: ELIQUIS Take 5 mg by mouth 2 (two) times daily.   aspirin EC 81 MG tablet Take 81 mg by mouth daily. Swallow whole.   atorvastatin 80 MG tablet Commonly known as: LIPITOR Take 80 mg  by mouth daily.   b complex vitamins capsule Take 1 capsule by mouth daily.   divalproex 500 MG 24 hr tablet Commonly known as: Depakote ER Take 1 tablet (500 mg total) by mouth at bedtime.   finasteride 5 MG tablet Commonly known as: PROSCAR Take 5 mg by mouth daily.   Rivaroxaban 15 MG Tabs tablet Commonly known as: XARELTO Take 1 tablet (15 mg total) by mouth daily with  supper.        Signed: [name] 12/05/2020, 11:59 AM

## 2020-12-05 NOTE — Progress Notes (Signed)
Speech Language Pathology Daily Session Note  Patient Details  Name: Cesar [name] MRN: [id number] Date of Birth: 1929-09-07  Today's Date: 12/05/2020 SLP Individual Time: 0725-0825 SLP Individual Time Calculation (min): 60 min  Short Term Goals: Week 1: SLP Short Term Goal 1 (Week 1): Pt will verbally recall 1 safety precaution with Mod A cues for aids/strategies for recall. SLP Short Term Goal 2 (Week 1): Pt will orient to date and situation with Min A cue for use of aids/strategies. SLP Short Term Goal 3 (Week 1): Pt will demonstrate ability to problem solve basic familiar tasks with Min A verbal and visual cues. SLP Short Term Goal 4 (Week 1): Pt will demonstrate awareness of functional errors with Mod A multimodal cues. SLP Short Term Goal 5 (Week 1): Pt will use compensatory strategies to increase speech intelligibility to 80% at sentence level with Mod A multimodal cues. SLP Short Term Goal 6 (Week 1): Pt will demonstrate efficient mastication and oral clearance of dysphagia 3 textures with Min A for use of compensatory swallow strategies (slow rate, small bites/sips, single sips of drink, clearance of pocketing).  Skilled Therapeutic Interventions: Skilled treatment session focused on dysphagia goals. Upon arrival, patient had his hearing aids in but they were not charged. SLP removed them and placed them on the charger and utilized the amplifier instead throughout the session. SLP facilitated session by providing skilled observation with breakfast meal of Dys. 3 textures with thin liquids. Patient consumed meal without overt s/s of aspiration but required Max A verbal, visual and tactile cues for use of a slow rate of self-feeding as well as small bites/sips. Recommend patient continue current diet. Patient left upright in bed with alarm on and all needs within reach. Continue with current plan of care.      Pain No/Denies Pain   Therapy/Group: Individual Therapy  PAYNE,  COURTNEY 12/05/2020, 12:31 PM

## 2020-12-05 NOTE — Progress Notes (Signed)
Physical Therapy Session Note  Patient Details  Name: Cesar [name] MRN: [id number] Date of Birth: [birth year]  Today's Date: 12/05/2020 PT Individual Time: 3151-7616 and 0737-1062 PT Individual Time Calculation (min): 55 min and 31 min  Short Term Goals: Week 1:  PT Short Term Goal 1 (Week 1): Patient will complete bed mobility with supervision PT Short Term Goal 2 (Week 1): Patient will transfer bed <> wc with MinA and LRAD PT Short Term Goal 3 (Week 1): Patient will ambulate 51ft with LRAD and MinA PT Short Term Goal 4 (Week 1): Patient will negotiate 2 steps with 1 HR and MinA  Skilled Therapeutic Interventions/Progress Updates:    Session 1: Pt received supine in bed with his wife, Tessie Fass, present and pt agreeable to therapy session. Supine>sitting L EOB, HOB flat and not using bedrail, min assist for trunk upright. Sitting EOB with CGA/min assist for trunk control due to repeated posterior lean/LOB - doffed dirty shirt without assist and clean shirt with set-up assist for orientation - donned pants min assist for threading - donned shoes max assist for time management. Sit>stand EOB>RW with mod assist for lifting due to lack of trunk/hip extension for upright - standing with mod assist for balance due to posterior lean while pulling pants up over hips without assist. R stand pivot EOB>w/c using RW with min assist for balance and therapits having to manually faciltiate hips to land in chair as pt starts to sit prior to turning fully.  Educated pt on increased safety with transfers and turning until feeling chair on backs of legs; however, pt unable to carryover throughout session. Transported to/from gym in w/c for time management and energy conservation. Gait training 32ft, 85ft, 85ft (seated rest breaks between) using RW with min assist for balance - demos decreased gait speed with shuffled step-to pattern leading with R LE and excessive trunk/hip flexion and R lateral lean - repeated cuing for  upright posture and increased step length with inconsistent improvement noted - placed theraband on RW to provide external cue for increased step length but ineffective with it possibly contributing to increased forward trunk flexion/downward gaze. Supine<>sit on mat with supervision/CGA. Performed supine bridging 2x15 reps with ball adductor squeeze during 2nd set as pt's R LE falling out towards the side - cuing for increased glute activation. Initiated repeated sit<>stands to/from EOM x3 reps without AD and min/mod assist but pt reports need to use bathroom. R stand pivot EOM>w/c using RW min assist. Transported back to room. R stand pivot w/c> BSC over toilet using grab bar with min assist and then total assist for LB clothing management. Pt requesting additional time - NT notified and present to assume care of pt.  Session 2: Pt received asleep, supine in bed with his wife, [name], present. Upon awakening pt reports he is exhausted but is agreeable to therapy session. Supine>sitting L EOB, HOB partially elevated, with CGA for steadying - demo less posterior trunk lean in sitting compared to earlier session. Sitting EOB donned shoes max assist. Sit>stand EOB>RW with heavy mod assist for lifting to stand due to strong and continuous posterior lean. Short distance ~37ft ambulatory transfer to w/c using RW with continued mod assist due to pt maintaining posterior lean despite cuing to correct then required max assist to safely pivot hips over to w/c as pt sitting significantly before turning fully with no safety awareness of this.  Transported to/from gym in w/c for time management and energy conservation. Stand pivot w/c<>Nustep with  min assist to come to stand from w/c - continues to require cuing to push up from w/c armrests and not pull on AD - min assist for balance while turning with pt demonstrating improving full turn prior to initiating sit. On Nustep performed a combination of B UE and B LE reciprocal  movements vs B LE only against level 4 resistance for 13min30sec totaling 149steps. Transported back to room and requests to get in bed. Stand pivot w/c>EOB using RW as above. Sit>supine supervision. Pt reports need to use bathroom - total assist urinal management and continent of bladder. Pt left supine in bed with needs in reach and bed alarm on.   Therapy Documentation Precautions:  Precautions Precautions: Fall Precaution Comments: HOH, expressive difficulty Restrictions Weight Bearing Restrictions: No Other Position/Activity Restrictions: HOH, does have hearing aides  Pain: Session 1: Denies pain during session.  Session 2: Denies pain during session.  Therapy/Group: Individual Therapy  Tawana Scale , PT, DPT, CSRS  12/05/2020, 7:44 AM

## 2020-12-05 NOTE — Progress Notes (Signed)
PROGRESS NOTE   Subjective/Complaints:  Pt smiling , no pocket talker , reduced attn ROS- neg CP SOB, N/V/D  Objective:   No results found. Recent Labs    12/03/20 0509  WBC 7.6  HGB 13.3  HCT 39.3  PLT 108*   Recent Labs    12/03/20 0509  NA 138  K 4.5  CL 104  CO2 25  GLUCOSE 100*  BUN 31*  CREATININE 1.31*  CALCIUM 8.8*    Intake/Output Summary (Last 24 hours) at 12/05/2020 7482 Last data filed at 12/05/2020 0347 Gross per 24 hour  Intake 600 ml  Output 600 ml  Net 0 ml     Pressure Injury 10/22/20 Buttocks Left;Mid Stage 2 -  Partial thickness loss of dermis presenting as a shallow open injury with a red, pink wound bed without slough. (Active)  10/22/20 1447  Location: Buttocks  Location Orientation: Left;Mid  Staging: Stage 2 -  Partial thickness loss of dermis presenting as a shallow open injury with a red, pink wound bed without slough.  Wound Description (Comments):   Present on Admission: Yes    Physical Exam: Vital Signs Blood pressure 122/66, pulse 75, temperature 97.9 F (36.6 C), temperature source Oral, resp. rate 18, height 6\' 1"  (1.854 m), weight 84.6 kg, SpO2 98 %.  General: No acute distress Mood and affect are appropriate Heart: Regular rate and rhythm no rubs murmurs or extra sounds Lungs: Clear to auscultation, breathing unlabored, no rales or wheezes Abdomen: Positive bowel sounds, soft nontender to palpation, nondistended Extremities: No clubbing, cyanosis, or edema  Skin: scalp wound CDI, staples removed  Neurologic: Cranial nerves II through XII intact, motor strength is 5/5 in bilateral deltoid, bicep, tricep, grip, hip flexor, knee extensors, ankle dorsiflexor and plantar flexor Sensory exam normal sensation to light touch and proprioception in bilateral upper and lower extremities Cerebellar exam normal finger to nose to finger as well as heel to shin in bilateral upper  and lower extremities Musculoskeletal: Full range of motion in all 4 extremities. No joint swelling   Musculoskeletal: Full range of motion in all 4 extremities. No joint swelling   Assessment/Plan: 1. Functional deficits which require 3+ hours per day of interdisciplinary therapy in a comprehensive inpatient rehab setting.  Physiatrist is providing close team supervision and 24 hour management of active medical problems listed below.  Physiatrist and rehab team continue to assess barriers to discharge/monitor patient progress toward functional and medical goals  Care Tool:  Bathing    Body parts bathed by patient: Right arm,Left arm,Chest,Abdomen,Right upper leg,Left upper leg,Right lower leg,Left lower leg,Face   Body parts bathed by helper: Front perineal area,Buttocks     Bathing assist Assist Level: Moderate Assistance - Patient 50 - 74%     Upper Body Dressing/Undressing Upper body dressing   What is the patient wearing?: Pull over shirt    Upper body assist Assist Level: Minimal Assistance - Patient > 75%    Lower Body Dressing/Undressing Lower body dressing      What is the patient wearing?: Pants     Lower body assist Assist for lower body dressing: Moderate Assistance - Patient 50 - 74%  Toileting Toileting    Toileting assist Assist for toileting: Minimal Assistance - Patient > 75%     Transfers Chair/bed transfer  Transfers assist     Chair/bed transfer assist level: Minimal Assistance - Patient > 75%     Locomotion Ambulation   Ambulation assist      Assist level: Minimal Assistance - Patient > 75% Assistive device: Walker-rolling Max distance: 45ft   Walk 10 feet activity   Assist     Assist level: Contact Guard/Touching assist Assistive device: Walker-rolling   Walk 50 feet activity   Assist    Assist level: 2 helpers Assistive device: Walker-rolling    Walk 150 feet activity   Assist Walk 150 feet activity  did not occur: Safety/medical concerns         Walk 10 feet on uneven surface  activity   Assist Walk 10 feet on uneven surfaces activity did not occur: Safety/medical concerns         Wheelchair     Assist Will patient use wheelchair at discharge?: No Type of Wheelchair: Manual    Wheelchair assist level: Minimal Assistance - Patient > 75% Max wheelchair distance: 150    Wheelchair 50 feet with 2 turns activity    Assist        Assist Level: Minimal Assistance - Patient > 75%   Wheelchair 150 feet activity     Assist      Assist Level: Minimal Assistance - Patient > 75%   Blood pressure 122/66, pulse 75, temperature 97.9 F (36.6 C), temperature source Oral, resp. rate 18, height 6\' 1"  (1.854 m), weight 84.6 kg, SpO2 98 %.  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 Status post bur hole 11/21/2020 -patient may shower -ELOS/Goals: 8-12 days, min assist PT, min-sup with OT and mod I with cognition and speech/swallowing  2. Antithrombotics: -DVT/anticoagulation:Subcutaneous heparin -antiplatelet therapy: N/A 3. Pain Management:Tylenol as needed 4. Mood:Provide emotional support -antipsychotic agents: N/A 5. Neuropsych: This patientisnot quitecapable of making decisions onhisown behalf. 6. Skin/Wound Care:Routine skin checks - staples out  7. Fluids/Electrolytes/Nutrition:Routine in and outs with follow-up chemistries 8. Seizure disorder. Depakote 250 mg every 6 hours 9. Atrial fibrillation. Cardiac rate controlled. Eliquis on hold due to SDH x4 to 6 weeks. Reg rhythm today on exam. Vitals:   12/04/20 2032 12/05/20 0342  BP: 138/75 122/66  Pulse: 74 75  Resp: 14 18  Temp: 98.6 F (37 C) 97.9 F (36.6 C)  SpO2: 99% 98%  rate controlled  3/3 10. CKD stage III. Follow-up chemistries 11. Ascending thoracic aortic aneurysm. Follow-up outpatient Dr. Caffie Pinto 12. 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 Follow-up outpatient cardiology services Dr. [name] 13. BPH. Proscar 5 mg daily. Check PVR 14. History of OSA. Patient not on CPAP. 15. Dysphagia. Dysphagia #1 thin liquids.  -pt more alert today, should be able to advance diet soon.  -Follow-up speech therapy    LOS: 3 days A FACE TO Bruce E Terrilee Dudzik 12/05/2020, 8:29 AM

## 2020-12-05 NOTE — Progress Notes (Signed)
Speech Language Pathology Daily Session Note  Patient Details  Name: Cesar [name] MRN: [id number] Date of Birth: [birth year]  Today's Date: 12/05/2020 SLP Individual Time: 1300-1355 SLP Individual Time Calculation (min): 55 min  Short Term Goals: Week 1: SLP Short Term Goal 1 (Week 1): Pt will verbally recall 1 safety precaution with Mod A cues for aids/strategies for recall. SLP Short Term Goal 2 (Week 1): Pt will orient to date and situation with Min A cue for use of aids/strategies. SLP Short Term Goal 3 (Week 1): Pt will demonstrate ability to problem solve basic familiar tasks with Min A verbal and visual cues. SLP Short Term Goal 4 (Week 1): Pt will demonstrate awareness of functional errors with Mod A multimodal cues. SLP Short Term Goal 5 (Week 1): Pt will use compensatory strategies to increase speech intelligibility to 80% at sentence level with Mod A multimodal cues. SLP Short Term Goal 6 (Week 1): Pt will demonstrate efficient mastication and oral clearance of dysphagia 3 textures with Min A for use of compensatory swallow strategies (slow rate, small bites/sips, single sips of drink, clearance of pocketing).  Skilled Therapeutic Interventions: Pt was seen for skilled ST targeting cognitive goals and education with pt's wife at bedside. Pt recalled 2-3 basic activities from earlier therapy session with Min A question cues. SLP facilitated session with basic money management task using cash and coins. Pt sorted money with Supervision, but required overall Mod A verbal and visual cueing for basic problem solving and error awareness in order to display set amounts of change. Increased Max A required for calculating totals and change (additoin and subtraction). He also attempted to complete a basic medication management task, in which he read medication labels and interpreted them to answer questions and organize a QID pill chart. Pt required Max A for problem solving and awareness  throughout task. He acknowledged difficulty level with Min A questioning, but seemed to become frustrated. Wife at bedside expressed concern for level of assistance needed for these cognitive tasks, as she had not realized how much his cognition had been impacted until this point. Education provided regarding impact of SDH on cognitive functioning, as well as potential for cognitive neurobehavioral to take longer than physical recovery. Pt's wife appreciative of education and verbalized acceptance that pt will need more assistance with familiar tasks as home, due to cognition. However, she did report this to be emotionally upsetting to her - emotional support provided. SLP left pt laying in bed with alarm set and needs within reach. Continue per current plan of care.        Pain Pain Assessment Pain Scale: 0-10 Pain Score: 0-No pain  Therapy/Group: Individual Therapy  Arbutus Leas 12/05/2020, 7:20 AM

## 2020-12-06 DIAGNOSIS — S065X0D Traumatic subdural hemorrhage without loss of consciousness, subsequent encounter: Secondary | ICD-10-CM | POA: Diagnosis not present

## 2020-12-06 LAB — CBC
HCT: 41.4 % (ref 39.0–52.0)
Hemoglobin: 13.4 g/dL (ref 13.0–17.0)
MCH: 32.3 pg (ref 26.0–34.0)
MCHC: 32.4 g/dL (ref 30.0–36.0)
MCV: 99.8 fL (ref 80.0–100.0)
Platelets: 33 10*3/uL — ABNORMAL LOW (ref 150–400)
RBC: 4.15 MIL/uL — ABNORMAL LOW (ref 4.22–5.81)
RDW: 13.5 % (ref 11.5–15.5)
WBC: 9 10*3/uL (ref 4.0–10.5)
nRBC: 0 % (ref 0.0–0.2)

## 2020-12-06 LAB — URINALYSIS, ROUTINE W REFLEX MICROSCOPIC
Bilirubin Urine: NEGATIVE
Glucose, UA: NEGATIVE mg/dL
Ketones, ur: NEGATIVE mg/dL
Nitrite: NEGATIVE
Protein, ur: 30 mg/dL — AB
RBC / HPF: >50 RBC/hpf — ABNORMAL HIGH (ref 0–5)
Specific Gravity, Urine: 1.018 (ref 1.005–1.030)
WBC, UA: >50 WBC/hpf — ABNORMAL HIGH (ref 0–5)
pH: 5 (ref 5.0–8.0)

## 2020-12-06 MED ORDER — CEPHALEXIN 250 MG PO CAPS
250.0000 mg | ORAL_CAPSULE | Freq: Three times a day (TID) | ORAL | Status: DC
  Administered 2020-12-06 – 2020-12-08 (×6): 250 mg via ORAL
  Filled 2020-12-06 (×6): qty 1

## 2020-12-06 NOTE — Progress Notes (Signed)
Speech Language Pathology Daily Session Note  Patient Details  Name: Cesar [name] MRN: [id number] Date of Birth: 1929/04/15  Today's Date: 12/06/2020 SLP Individual Time: 2500-3704 SLP Individual Time Calculation (min): 42 min  Short Term Goals: Week 1: SLP Short Term Goal 1 (Week 1): Pt will verbally recall 1 safety precaution with Mod A cues for aids/strategies for recall. SLP Short Term Goal 2 (Week 1): Pt will orient to date and situation with Min A cue for use of aids/strategies. SLP Short Term Goal 3 (Week 1): Pt will demonstrate ability to problem solve basic familiar tasks with Min A verbal and visual cues. SLP Short Term Goal 4 (Week 1): Pt will demonstrate awareness of functional errors with Mod A multimodal cues. SLP Short Term Goal 5 (Week 1): Pt will use compensatory strategies to increase speech intelligibility to 80% at sentence level with Mod A multimodal cues. SLP Short Term Goal 6 (Week 1): Pt will demonstrate efficient mastication and oral clearance of dysphagia 3 textures with Min A for use of compensatory swallow strategies (slow rate, small bites/sips, single sips of drink, clearance of pocketing).  Skilled Therapeutic Interventions: Pt was seen for skilled ST targeting cognitive goals. His wife and son were present for session. Pt oriented to date with Min A verbal and visual cues for use of calendar. During an additional calendar task, pt required Min to Mod A verbal and visual cues for problem solving and error awareness in order to use the calendar to interpret dates and plan. Pt became more frustrated during PEG board task in which he had to recreate designs from photos on the board in front of him. Although family reported pt becomes easily frustrated at baseline, pt nodded his head to indicate yes when asked if he feels he becomes frustrated even more easily in setting of acute SDH. Pt required Max faded to Min A verbal and visual cues for error awareness and problem  solving to recreate 2 basic level designs, Max faded to Mod A for the same skills for 2 mildly complex level designs. With Min A prompting and other verbal cues, pt recalled the events of this morning's OT session accurately. Pt left laying in bed with alarm set and needs within reach. Continue per current plan of care.       Pain Pain Assessment Pain Scale: Faces Pain Score: 0-No pain Faces Pain Scale: No hurt  Therapy/Group: Individual Therapy  Arbutus Leas 12/06/2020, 7:18 AM

## 2020-12-06 NOTE — Progress Notes (Signed)
Physical Therapy Session Note  Patient Details  Name: Cesar [name] MRN: [id number] Date of Birth: [birth year]  Today's Date: 12/06/2020 PT Individual Time: 5686-1683 PT Individual Time Calculation (min): 55 min   Short Term Goals: Week 1:  PT Short Term Goal 1 (Week 1): Patient will complete bed mobility with supervision PT Short Term Goal 2 (Week 1): Patient will transfer bed <> wc with MinA and LRAD PT Short Term Goal 3 (Week 1): Patient will ambulate 22ft with LRAD and MinA PT Short Term Goal 4 (Week 1): Patient will negotiate 2 steps with 1 HR and MinA  Skilled Therapeutic Interventions/Progress Updates:    Patient received reclined in bed, using urinal, wife at bedside, agreeable to PT. He denies pain. B hearing aids donned and seeming to work. Hematuria noted in urinal, RN aware. He was able to come sit edge of bed with MinA and verbal cues. He remains impulsive leaving little opportunity for PT to cue patient on safety in the moment. He transferred to wc via stand pivot with RW and ModA. He maintains strong posterior lean for sit <> stand and throughout all standing tasks. PT transporting patient to therapy gym for time management and energy conservation. Anterior ball roll out with physioball to encourage anterior weight shift. No carry over noted to sit<> stand throughout session. Patient maintaining B LE too far in front of himself when initiating sit <>stand and was not responsive to multimodal cuing to correct foot positioning. Patient completing standing dynamic balance task with mirror for visual feedback on posture, U UE support placing pins on basketball hoop to encourage weight shift anteriorly. Patient was able to shift weight to adequately reach hoop, but would immediately shift back to weight in his heels. BITS system used on visual pursuit program to allow patient to reach outside BOS, U UE + MinA for balance. Patient only able to remain standing ~1 min prior to sitting down,  reporting fatigue. Patient returning to room in wc, transferring ot bed via squat pivot with ModA due to patients tendency to dive toward the bed instead of turning full. Returning supine, bed alarm on, call light within reach.   Therapy Documentation Precautions:  Precautions Precautions: Fall Precaution Comments: HOH, expressive difficulty Restrictions Weight Bearing Restrictions: No Other Position/Activity Restrictions: HOH, does have hearing aides    Therapy/Group: Individual Therapy  Karoline Caldwell, PT, DPT, CBIS  12/06/2020, 7:35 AM

## 2020-12-06 NOTE — Progress Notes (Signed)
Obvious hematuria when patient voided during the night, x 1 occurrence. Will monitor and advise oncoming shift.

## 2020-12-06 NOTE — Progress Notes (Signed)
Patient ID: Cesar [name], male   DOB: 1928-11-26, 87 y.o.   MRN: [id number]   Patient care previously established with [facility] for [facility]. Orders faxed.  [place], [place]

## 2020-12-06 NOTE — Progress Notes (Signed)
PROGRESS NOTE   Subjective/Complaints:  RN reports hematuria, pt denies pain with urination, OT reports pt has some coordination diffculties with the Right hand  ROS- neg CP SOB, N/V/D  Objective:   No results found. No results for input(s): WBC, HGB, HCT, PLT in the last 72 hours. No results for input(s): NA, K, CL, CO2, GLUCOSE, BUN, CREATININE, CALCIUM in the last 72 hours.  Intake/Output Summary (Last 24 hours) at 12/06/2020 0827 Last data filed at 12/06/2020 0449 Gross per 24 hour  Intake 360 ml  Output 625 ml  Net -265 ml     Pressure Injury 10/22/20 Buttocks Left;Mid Stage 2 -  Partial thickness loss of dermis presenting as a shallow open injury with a red, pink wound bed without slough. (Active)  10/22/20 1447  Location: Buttocks  Location Orientation: Left;Mid  Staging: Stage 2 -  Partial thickness loss of dermis presenting as a shallow open injury with a red, pink wound bed without slough.  Wound Description (Comments):   Present on Admission: Yes    Physical Exam: Vital Signs Blood pressure 140/68, pulse 77, temperature 98.3 F (36.8 C), resp. rate 16, height 6\' 1"  (1.854 m), weight 84.6 kg, SpO2 97 %.  General: No acute distress Mood and affect are appropriate Heart: Regular rate and rhythm no rubs murmurs or extra sounds Lungs: Clear to auscultation, breathing unlabored, no rales or wheezes Abdomen: Positive bowel sounds, soft nontender to palpation, nondistended Extremities: No clubbing, cyanosis, or edema  Skin: scalp wound CDI, staples removed  Neurologic: Cranial nerves II through XII intact, motor strength is 5/5 in bilateral deltoid, bicep, tricep, grip,    Musculoskeletal: Full range of motion in all 4 extremities. No joint swelling   Assessment/Plan: 1. Functional deficits which require 3+ hours per day of interdisciplinary therapy in a comprehensive inpatient rehab setting.  Physiatrist is  providing close team supervision and 24 hour management of active medical problems listed below.  Physiatrist and rehab team continue to assess barriers to discharge/monitor patient progress toward functional and medical goals  Care Tool:  Bathing    Body parts bathed by patient: Right arm,Left arm,Chest,Abdomen,Right upper leg,Left upper leg,Right lower leg,Left lower leg,Face   Body parts bathed by helper: Front perineal area,Buttocks     Bathing assist Assist Level: Moderate Assistance - Patient 50 - 74%     Upper Body Dressing/Undressing Upper body dressing   What is the patient wearing?: Pull over shirt    Upper body assist Assist Level: Minimal Assistance - Patient > 75%    Lower Body Dressing/Undressing Lower body dressing      What is the patient wearing?: Pants     Lower body assist Assist for lower body dressing: Moderate Assistance - Patient 50 - 74%     Toileting Toileting    Toileting assist Assist for toileting: Minimal Assistance - Patient > 75%     Transfers Chair/bed transfer  Transfers assist     Chair/bed transfer assist level: Moderate Assistance - Patient 50 - 74% Chair/bed transfer assistive device: Programmer, multimedia   Ambulation assist      Assist level: Minimal Assistance - Patient > 75% Assistive  device: Walker-rolling Max distance: 66ft   Walk 10 feet activity   Assist     Assist level: Minimal Assistance - Patient > 75% Assistive device: Walker-rolling   Walk 50 feet activity   Assist    Assist level: 2 helpers Assistive device: Walker-rolling    Walk 150 feet activity   Assist Walk 150 feet activity did not occur: Safety/medical concerns         Walk 10 feet on uneven surface  activity   Assist Walk 10 feet on uneven surfaces activity did not occur: Safety/medical concerns         Wheelchair     Assist Will patient use wheelchair at discharge?: No Type of Wheelchair: Manual     Wheelchair assist level: Minimal Assistance - Patient > 75% Max wheelchair distance: 150    Wheelchair 50 feet with 2 turns activity    Assist        Assist Level: Minimal Assistance - Patient > 75%   Wheelchair 150 feet activity     Assist      Assist Level: Minimal Assistance - Patient > 75%   Blood pressure 140/68, pulse 77, temperature 98.3 F (36.8 C), resp. rate 16, height 6\' 1"  (1.854 m), weight 84.6 kg, SpO2 97 %.  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 Status post bur hole 11/21/2020 -patient may shower -ELOS/Goals: 8-12 days, min assist PT, min-sup with OT and mod I with cognition and speech/swallowing  2. Antithrombotics: -DVT/anticoagulation:Subcutaneous heparin -antiplatelet therapy: N/A 3. Pain Management:Tylenol as needed 4. Mood:Provide emotional support -antipsychotic agents: N/A 5. Neuropsych: This patientisnot quitecapable of making decisions onhisown behalf. 6. Skin/Wound Care:Routine skin checks - staples out  7. Fluids/Electrolytes/Nutrition:Routine in and outs with follow-up chemistries 8. Seizure disorder. Depakote 250 mg every 6 hours 9. Atrial fibrillation. Cardiac rate controlled. Eliquis on hold due to SDH x4 to 6 weeks. Reg rhythm today on exam. Vitals:   12/05/20 2035 12/06/20 0453  BP: 135/73 140/68  Pulse: 90 77  Resp: 16 16  Temp: 98.6 F (37 C) 98.3 F (36.8 C)  SpO2: 100% 97%  rate controlled 3/4 10. CKD stage III. Follow-up chemistries 11. Ascending thoracic aortic aneurysm. Follow-up outpatient Dr. Caffie Pinto 12. 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 Follow-up outpatient cardiology services Dr. [name] 13. BPH. Proscar 5 mg daily. Check PVR 14. History of OSA. Patient not on  CPAP. 15. Dysphagia. Dysphagia #1 thin liquids.  -pt more alert today, should be able to advance diet soon.  -Follow-up speech therapy  16.  Hematuria reported by nsg last noc, check UA C and S, check CBC may need to hold heparin  LOS: 4 days A FACE TO Cesar [name] 12/06/2020, 8:27 AM

## 2020-12-06 NOTE — Progress Notes (Signed)
Occupational Therapy Session Note  Patient Details  Name: Cesar [name] MRN: [id number] Date of Birth: [birth year]  Today's Date: 12/06/2020 OT Individual Time: 7322-0254 OT Individual Time Calculation (min): 58 min    Short Term Goals: Week 1:  OT Short Term Goal 1 (Week 1): Pt will complete LB bathing sit to stand with min assist for two consecutive sessions. OT Short Term Goal 2 (Week 1): Pt will complete LB dressing with min assist sit to stand for two consecutive sessions. OT Short Term Goal 3 (Week 1): Pt will complete toilet transfers with min assist using the RW for support.  Skilled Therapeutic Interventions/Progress Updates:    Pt in bed to start session.  Worked on showering, dressing, and grooming tasks for ADL.  He needed min assist for supine to sit EOB with mod assist for transfer to the shower bench with use of the RW for support.  Mod demonstrational cueing was needed for hand placement during sit to stand and stand to sit as he does not back up to the surface far enough to sit safely and does not push up or reach back with transitioning.  Mod instructional cueing for sequencing with bathing in order to wash thoroughly and to rinse all of the soap off.  Increased forward lean noted in the shower, requiring use of his UE on the grab bar at times, as well as demonstrational cueing to sit up with his shoulders back against the surface.  He completed transfer to the wheelchair for dressing and grooming tasks.  Mod demonstrational cueing again to position himself inside of the RW and to back up and reach back to the surface.  Mod assist for donning brief and pants secondary to needing increased time to place the LLE in the pants leg.  He was able to stand and pull them up with min assist for balance.  Supervision for donning pullover shirt as well as gripper socks, after therapist provided total assist for TEDs.  He completed brushing his hair and his teeth with setup of supplies in  sitting.  Finished session with transfer back to the bed at mod assist stand pivot and no device.  Call button and phone in reach with safety alarm in place.    Therapy Documentation Precautions:  Precautions Precautions: Fall Precaution Comments: HOH, expressive difficulty Restrictions Weight Bearing Restrictions: No Other Position/Activity Restrictions: HOH, does have hearing aides  Pain: Pain Assessment Pain Scale: Faces Pain Score: 0-No pain ADL: See Care Tool Section for some details of mobility and selfcare  Therapy/Group: Individual Therapy  Cecilia Vancleve OTR/L 12/06/2020, 8:58 AM

## 2020-12-07 NOTE — Progress Notes (Signed)
Speech Language Pathology Daily Session Note  Patient Details  Name: Cesar [name] MRN: [id number] Date of Birth: October 19, 1928  Today's Date: 12/07/2020 SLP Individual Time: 4627-0350 SLP Individual Time Calculation (min): 40 min  Short Term Goals: Week 1: SLP Short Term Goal 1 (Week 1): Pt will verbally recall 1 safety precaution with Mod A cues for aids/strategies for recall. SLP Short Term Goal 2 (Week 1): Pt will orient to date and situation with Min A cue for use of aids/strategies. SLP Short Term Goal 3 (Week 1): Pt will demonstrate ability to problem solve basic familiar tasks with Min A verbal and visual cues. SLP Short Term Goal 4 (Week 1): Pt will demonstrate awareness of functional errors with Mod A multimodal cues. SLP Short Term Goal 5 (Week 1): Pt will use compensatory strategies to increase speech intelligibility to 80% at sentence level with Mod A multimodal cues. SLP Short Term Goal 6 (Week 1): Pt will demonstrate efficient mastication and oral clearance of dysphagia 3 textures with Min A for use of compensatory swallow strategies (slow rate, small bites/sips, single sips of drink, clearance of pocketing).  Skilled Therapeutic Interventions:  Pt was seen for skilled ST targeting goals or dysphagia and cognition.  Pt was eating breakfast upon therapist's arrival.  While pt demonstrated a fast rate of intake, he was able to efficiently clear solids from the oral cavity without difficulty and without overt s/s of aspiration.  After completion of meal, pt was transferred to wheelchair with mod assist multimodal cues for safety.  Pt completed oral care and washed his face after meal with supervision verbal cues for task sequencing and organization.  Pt was oriented to place independently, date with min verbal cues for use of calendar, and situation with mod assist verbal cues.  Pt was left in wheelchair with chair alarm set and wife at bedside.  Continue per current plan of care.     Pain Pain Assessment Pain Scale: 0-10 Pain Score: 0-No pain  Therapy/Group: Individual Therapy  Jaysha Lasure, Selinda Orion 12/07/2020, 8:45 AM

## 2020-12-07 NOTE — Progress Notes (Signed)
Occupational Therapy Session Note  Patient Details  Name: Cesar [name] MRN: [id number] Date of Birth: [birth year]  Today's Date: 12/07/2020 OT Individual Time: 9518-8416 and 6063-0160 OT Individual Time Calculation (min): 56 min and 28 min   Short Term Goals: Week 1:  OT Short Term Goal 1 (Week 1): Pt will complete LB bathing sit to stand with min assist for two consecutive sessions. OT Short Term Goal 2 (Week 1): Pt will complete LB dressing with min assist sit to stand for two consecutive sessions. OT Short Term Goal 3 (Week 1): Pt will complete toilet transfers with min assist using the RW for support.  Skilled Therapeutic Interventions/Progress Updates:    Pt greeted in bed with no c/o pain. ADL needs met. Wife at bedside. Supine<sit from flat bed without bedrail completed with Mod A and vcs. Spouse reported this was their home setup. Mod A for sit<stand and for stand pivot<w/c using RW with pt prematurely sitting in the w/c before backing up fully and not reaching back for armrests. Escorted him to an outdoor setting via w/c to therapeutically address psychosocial health. First guided pt through UB exercises using 2# bar x10 reps 2 sets for UB strengthening. Tactile, visual, demonstration, and min manual cues for carryover of proper technique due to [facility]. Next pt completed 5 sit<stands using RW with education emphasis placed on anterior weight shifting during power up and proper hand placement during stand<sit transitions. Pt required CGA-Min A for stated sit<stands, Min-Mod A for stand<sit due to poor carryover of proper hand placement when returning to his chair. Continued working on UB strengthening by having pt self propel the w/c over uneven concrete. Pt required CGA and manual cues to maintain straight path with w/c, Mod A to maneuver up inclines and Max A for changing direction of path, hand over hand to help pt do this with as much independence as possible. Note that he needed several  seated rest breaks due to fatigue throughout activity. Afterwards pt was returned to the room via w/c, Min A for stand pivot<bed using RW with pt still not reaching back before sitting to improve eccentric control when lowering. He returned to flat bed without bedrail given setup assistance, visibly fatigued. Pt remained in bed with all needs within reach and bed alarm set, in care of NT for vitals.    2nd Session 1:1 tx (28 min) Pt greeted in bed, refusing to transfer to the w/c for session. OT and wife provided encouragement for OOB activity however pt was adamant that he would only participate in bedlevel therapy. Pt began session by participating in ball toss EOB, supervision for supine<sit with HOB elevated. Worked on trunk control/anterior weight shifting at this time with pt experiencing multiple posterior LOBs, able to self correct balance given min cuing. When he became increasingly tired, pt initiated returning to bed, able to boost himself up using the headboard with his Lt UE once OT placed bed in trendelenburg position. Focus was then placed on UB strengthening/Rt NMR by engaging in ball toss with HOB elevated, visual and demonstrational cues for catching ball vs slapping ball away to increase motoric challenge of the Rt UE. Pt required 2 rest breaks due to fatigue. He then initiated using the urinal. After he was finished it was noted that his brief was soiled with urine. Pt left in care of RN to assist with personal care.   Therapy Documentation Precautions:  Precautions Precautions: Fall Precaution Comments: HOH, expressive difficulty Restrictions Weight Bearing  Restrictions: No Other Position/Activity Restrictions: HOH, does have hearing aides Vital Signs: Therapy Vitals Temp: 98.7 F (37.1 C) Temp Source: Oral Pulse Rate: 80 Resp: 15 BP: 94/62 Patient Position (if appropriate): Lying Oxygen Therapy SpO2: 96 % O2 Device: Room Air Pain: no c/o pain during 2nd session either    ADL: ADL Eating: Supervision/safety Where Assessed-Eating: Wheelchair Grooming: Supervision/safety Where Assessed-Grooming: Wheelchair Upper Body Bathing: Supervision/safety Where Assessed-Upper Body Bathing: Edge of bed Lower Body Bathing: Moderate assistance Where Assessed-Lower Body Bathing: Edge of bed Upper Body Dressing: Supervision/safety Where Assessed-Upper Body Dressing: Edge of bed Lower Body Dressing: Moderate assistance Where Assessed-Lower Body Dressing: Edge of bed Toileting: Moderate assistance Where Assessed-Toileting: Bedside Commode Toilet Transfer: Moderate assistance Toilet Transfer Method: Stand pivot Toilet Transfer Equipment: Engineer, technical sales Transfer: Not assessed Social research officer, government: Not assessed      Therapy/Group: Individual Therapy  Adda Stokes A Hoyt Leanos 12/07/2020, 3:57 PM

## 2020-12-07 NOTE — Progress Notes (Signed)
Physical Therapy Session Note  Patient Details  Name: Cesar [name] MRN: [id number] Date of Birth: [birth year]  Today's Date: 12/07/2020 PT Individual Time: 0900-0959 PT Individual Time Calculation (min): 59 min   Short Term Goals: Week 1:  PT Short Term Goal 1 (Week 1): Patient will complete bed mobility with supervision PT Short Term Goal 2 (Week 1): Patient will transfer bed <> wc with MinA and LRAD PT Short Term Goal 3 (Week 1): Patient will ambulate 91ft with LRAD and MinA PT Short Term Goal 4 (Week 1): Patient will negotiate 2 steps with 1 HR and MinA  Skilled Therapeutic Interventions/Progress Updates:    Pt received sitting upright in w/c, awake and agreeable to therapy. Wife at bedside. Patient reports no pain. W/c transport for time management to main rehab gym. Sit<>stand with minA to RW with cues needed for hand placement; moderate posterior bias in initial standing - able to slightly improve with multi modal cues but limited ability to maintain. Gait x15ft with minA and RW - demo's gait deficits with almost a heel walking step-to pattern with decreased L > R step length. With 180deg turns, significant unsteadiness present with delayed righting reactions. Cues throughout for improving anterior weight shift and using blue theraband on walker for visual cue to improve proximity to RW. Patient very hard of hearing and needing frequent cueing. Further gait training x78ft again but this time incorporating obstacle training around x5 cones that were spaced ~75ft apart - he needed modA and RW for completing this with even greater difficulty maintaining safety with RW management - stepping 1 foot outside of walker frame while weaving in/out of cones. He also continues to show L > R LE weakness and would drag his L foot rather than step with it with inability to correct with cueing. Pt with some shortness of breath after gait trials, attempted to assess oxygen/HR with dynamap but poor perfusion and  unable to read - pt recovers with seated rest break and subjectively reports feeling okay and shortness of breath is not new. Gait training in // bars with minA and BUE support to // bar, working on lateral stepping with emphasis on forward weight shift and increasing L step height. (Pt mildly impulsive with standing in // bars without cues, encouraged safety awareness throughout). He struggled with ability to maintain upright posture and would frequently show excessive forwardly flexed trunk, despite using mirror for visual feedback. Stair training completed, up/down x4 steps requiring minA for ascending with bilateral hand rails and self selected reciprocal pattern - cues for step-to pattern during descent and he required modA for steadying due to strong posterior bias. After brief seated rest - completed Biodex NMR with postural stability with target for forward weight shift - pt able to reach target but had difficulty understanding rules/instructions - task also limited by ability to maintain standing for >1-2 minutes prior to fatigue. Pt returned to his room with totalA in w/c for time management and he remained seated in w/c with wife at bedside ,needs within reach, chair alarm on.   Therapy Documentation Precautions:  Precautions Precautions: Fall Precaution Comments: HOH, expressive difficulty Restrictions Weight Bearing Restrictions: No Other Position/Activity Restrictions: HOH, does have hearing aides  Therapy/Group: Individual Therapy  Oiva Dibari P Krishika Bugge PT 12/07/2020, 6:17 AM

## 2020-12-08 DIAGNOSIS — S065X0D Traumatic subdural hemorrhage without loss of consciousness, subsequent encounter: Secondary | ICD-10-CM | POA: Diagnosis not present

## 2020-12-08 LAB — CBC WITH DIFFERENTIAL/PLATELET
Abs Immature Granulocytes: 0.07 10*3/uL (ref 0.00–0.07)
Basophils Absolute: 0 10*3/uL (ref 0.0–0.1)
Basophils Relative: 1 %
Eosinophils Absolute: 0.1 10*3/uL (ref 0.0–0.5)
Eosinophils Relative: 2 %
HCT: 39.9 % (ref 39.0–52.0)
Hemoglobin: 12.8 g/dL — ABNORMAL LOW (ref 13.0–17.0)
Immature Granulocytes: 1 %
Lymphocytes Relative: 12 %
Lymphs Abs: 0.7 10*3/uL (ref 0.7–4.0)
MCH: 32 pg (ref 26.0–34.0)
MCHC: 32.1 g/dL (ref 30.0–36.0)
MCV: 99.8 fL (ref 80.0–100.0)
Monocytes Absolute: 0.7 10*3/uL (ref 0.1–1.0)
Monocytes Relative: 10 %
Neutro Abs: 4.6 10*3/uL (ref 1.7–7.7)
Neutrophils Relative %: 74 %
Platelets: 54 10*3/uL — ABNORMAL LOW (ref 150–400)
RBC: 4 MIL/uL — ABNORMAL LOW (ref 4.22–5.81)
RDW: 13.6 % (ref 11.5–15.5)
WBC: 6.3 10*3/uL (ref 4.0–10.5)
nRBC: 0 % (ref 0.0–0.2)

## 2020-12-08 LAB — URINE CULTURE: Culture: >=100000 — AB

## 2020-12-08 MED ORDER — CEPHALEXIN 250 MG PO CAPS
250.0000 mg | ORAL_CAPSULE | Freq: Four times a day (QID) | ORAL | Status: AC
  Administered 2020-12-08 – 2020-12-15 (×28): 250 mg via ORAL
  Filled 2020-12-08 (×28): qty 1

## 2020-12-08 NOTE — Progress Notes (Signed)
PROGRESS NOTE   Subjective/Complaints: Son at bedside, no concerns, pt asking about moving up d/c date No pain with urination  ROS- neg CP SOB, N/V/D  Objective:   No results found. Recent Labs    12/06/20 0940  WBC 9.0  HGB 13.4  HCT 41.4  PLT 33*   No results for input(s): NA, K, CL, CO2, GLUCOSE, BUN, CREATININE, CALCIUM in the last 72 hours.  Intake/Output Summary (Last 24 hours) at 12/08/2020 0957 Last data filed at 12/08/2020 0746 Gross per 24 hour  Intake 690 ml  Output 650 ml  Net 40 ml     Pressure Injury 10/22/20 Buttocks Left;Mid Stage 2 -  Partial thickness loss of dermis presenting as a shallow open injury with a red, pink wound bed without slough. (Active)  10/22/20 1447  Location: Buttocks  Location Orientation: Left;Mid  Staging: Stage 2 -  Partial thickness loss of dermis presenting as a shallow open injury with a red, pink wound bed without slough.  Wound Description (Comments):   Present on Admission: Yes    Physical Exam: Vital Signs Blood pressure 126/67, pulse (!) 57, temperature (!) 97.5 F (36.4 C), temperature source Oral, resp. rate 18, height 6\' 1"  (1.854 m), weight 84.6 kg, SpO2 99 %.  General: No acute distress Mood and affect are appropriate Heart: Regular rate and rhythm no rubs murmurs or extra sounds Lungs: Clear to auscultation, breathing unlabored, no rales or wheezes Abdomen: Positive bowel sounds, soft nontender to palpation, nondistended Extremities: No clubbing, cyanosis, or edema  Skin: scalp wound CDI, staples removed  Neurologic: Cranial nerves II through XII intact, motor strength is 5/5 in bilateral deltoid, bicep, tricep, grip,    Musculoskeletal: Full range of motion in all 4 extremities. No joint swelling   Assessment/Plan: 1. Functional deficits which require 3+ hours per day of interdisciplinary therapy in a comprehensive inpatient rehab  setting.  Physiatrist is providing close team supervision and 24 hour management of active medical problems listed below.  Physiatrist and rehab team continue to assess barriers to discharge/monitor patient progress toward functional and medical goals  Care Tool:  Bathing    Body parts bathed by patient: Right arm,Left arm,Chest,Abdomen,Right upper leg,Left upper leg,Left lower leg,Face,Front perineal area,Right lower leg   Body parts bathed by helper: Buttocks     Bathing assist Assist Level: Minimal Assistance - Patient > 75%     Upper Body Dressing/Undressing Upper body dressing   What is the patient wearing?: Pull over shirt    Upper body assist Assist Level: Supervision/Verbal cueing    Lower Body Dressing/Undressing Lower body dressing      What is the patient wearing?: Pants,Incontinence brief     Lower body assist Assist for lower body dressing: Moderate Assistance - Patient 50 - 74%     Toileting Toileting    Toileting assist Assist for toileting: Minimal Assistance - Patient > 75%     Transfers Chair/bed transfer  Transfers assist     Chair/bed transfer assist level: Moderate Assistance - Patient 50 - 74% Chair/bed transfer assistive device: Programmer, multimedia   Ambulation assist      Assist level: Moderate Assistance -  Patient 50 - 74% Assistive device: Walker-rolling Max distance: 15'   Walk 10 feet activity   Assist     Assist level: Minimal Assistance - Patient > 75% Assistive device: Walker-rolling   Walk 50 feet activity   Assist    Assist level: 2 helpers Assistive device: Walker-rolling    Walk 150 feet activity   Assist Walk 150 feet activity did not occur: Safety/medical concerns         Walk 10 feet on uneven surface  activity   Assist Walk 10 feet on uneven surfaces activity did not occur: Safety/medical concerns         Wheelchair     Assist Will patient use wheelchair at  discharge?: No Type of Wheelchair: Manual    Wheelchair assist level: Minimal Assistance - Patient > 75% Max wheelchair distance: 150    Wheelchair 50 feet with 2 turns activity    Assist        Assist Level: Minimal Assistance - Patient > 75%   Wheelchair 150 feet activity     Assist      Assist Level: Minimal Assistance - Patient > 75%   Blood pressure 126/67, pulse (!) 57, temperature (!) 97.5 F (36.4 C), temperature source Oral, resp. rate 18, height 6\' 1"  (1.854 m), weight 84.6 kg, SpO2 99 %.  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 Status post bur hole 11/21/2020 -patient may shower -ELOS/Goals: 8-12 days, min assist PT, min-sup with OT and mod I with cognition and speech/swallowing  2. Antithrombotics: -DVT/anticoagulation:Subcutaneous heparin- d/c 3/4 due to low plt,repeat cbc -antiplatelet therapy: N/A 3. Pain Management:Tylenol as needed 4. Mood:Provide emotional support -antipsychotic agents: N/A 5. Neuropsych: This patientisnot quitecapable of making decisions onhisown behalf. 6. Skin/Wound Care:Routine skin checks - staples out  7. Fluids/Electrolytes/Nutrition:Routine in and outs with follow-up chemistries 8. Seizure disorder. Depakote 250 mg every 6 hours 9. Atrial fibrillation. Cardiac rate controlled. Eliquis on hold due to SDH x4 to 6 weeks. Reg rhythm today on exam. Vitals:   12/07/20 2031 12/08/20 0321  BP: 133/60 126/67  Pulse: 67 (!) 57  Resp: 18 18  Temp: 98.9 F (37.2 C) (!) 97.5 F (36.4 C)  SpO2: 98% 99%  rate controlled 3/6 10. CKD stage III. Follow-up chemistries 11. Ascending thoracic aortic aneurysm. Follow-up outpatient Dr. Caffie Pinto 12. 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 Follow-up  outpatient cardiology services Dr. [name] 13. BPH. Proscar 5 mg daily. Check PVR 14. History of OSA. Patient not on CPAP. 15. Dysphagia. Dysphagia #1 thin liquids.  -pt more alert today, should be able to advance diet soon.  -Follow-up speech therapy  16.  Hematuria reported by nsg last noc, has UTI pansensitive e coli, also holding heparin due to low PLT  LOS: 6 days A FACE TO [place] [facility] D[place]hia [place] 12/08/2020, 9:57 AM

## 2020-12-09 DIAGNOSIS — N183 Chronic kidney disease, stage 3 unspecified: Secondary | ICD-10-CM

## 2020-12-09 DIAGNOSIS — R1310 Dysphagia, unspecified: Secondary | ICD-10-CM

## 2020-12-09 DIAGNOSIS — D696 Thrombocytopenia, unspecified: Secondary | ICD-10-CM

## 2020-12-09 DIAGNOSIS — R569 Unspecified convulsions: Secondary | ICD-10-CM

## 2020-12-09 DIAGNOSIS — N390 Urinary tract infection, site not specified: Secondary | ICD-10-CM

## 2020-12-09 DIAGNOSIS — R319 Hematuria, unspecified: Secondary | ICD-10-CM

## 2020-12-09 DIAGNOSIS — S065X0D Traumatic subdural hemorrhage without loss of consciousness, subsequent encounter: Secondary | ICD-10-CM | POA: Diagnosis not present

## 2020-12-09 LAB — GLUCOSE, CAPILLARY: Glucose-Capillary: 116 mg/dL — ABNORMAL HIGH (ref 70–99)

## 2020-12-09 NOTE — Progress Notes (Signed)
PROGRESS NOTE   Subjective/Complaints: Patient seen sitting up in bed this morning eating breakfast.  He states he slept well overnight.  States he wants to go home.  ROS: Denies CP, SOB, N/V/D  Objective:   No results found. Recent Labs    12/08/20 1011  WBC 6.3  HGB 12.8*  HCT 39.9  PLT 54*   No results for input(s): NA, K, CL, CO2, GLUCOSE, BUN, CREATININE, CALCIUM in the last 72 hours.  Intake/Output Summary (Last 24 hours) at 12/09/2020 1245 Last data filed at 12/09/2020 0743 Gross per 24 hour  Intake 711 ml  Output 800 ml  Net -89 ml     Pressure Injury 10/22/20 Buttocks Left;Mid Stage 2 -  Partial thickness loss of dermis presenting as a shallow open injury with a red, pink wound bed without slough. (Active)  10/22/20 1447  Location: Buttocks  Location Orientation: Left;Mid  Staging: Stage 2 -  Partial thickness loss of dermis presenting as a shallow open injury with a red, pink wound bed without slough.  Wound Description (Comments):   Present on Admission: Yes    Physical Exam: Vital Signs Blood pressure (!) 151/82, pulse 60, temperature 98.6 F (37 C), temperature source Oral, resp. rate 18, height 6\' 1"  (1.854 m), weight 84.6 kg, SpO2 97 %. Constitutional: No distress . Vital signs reviewed. HENT: Normocephalic.  Atraumatic. Eyes: EOMI. No discharge. Cardiovascular: No JVD.  RRR. Respiratory: Normal effort.  No stridor.  Bilateral clear to auscultation. GI: Non-distended.  BS +. Skin: Warm and dry.  Intact. Psych: Normal mood.  Normal behavior. Musc: No edema in extremities.  No tenderness in extremities. Neurologic: Alert and oriented x3 HOH Motor: 5/5 throughout  Assessment/Plan: 1. Functional deficits which require 3+ hours per day of interdisciplinary therapy in a comprehensive inpatient rehab setting.  Physiatrist is providing close team supervision and 24 hour management of active medical  problems listed below.  Physiatrist and rehab team continue to assess barriers to discharge/monitor patient progress toward functional and medical goals  Care Tool:  Bathing    Body parts bathed by patient: Right arm,Left arm,Chest,Abdomen,Right upper leg,Left upper leg,Left lower leg,Face,Front perineal area,Right lower leg   Body parts bathed by helper: Buttocks     Bathing assist Assist Level: Minimal Assistance - Patient > 75%     Upper Body Dressing/Undressing Upper body dressing   What is the patient wearing?: Pull over shirt    Upper body assist Assist Level: Supervision/Verbal cueing    Lower Body Dressing/Undressing Lower body dressing      What is the patient wearing?: Pants,Incontinence brief     Lower body assist Assist for lower body dressing: Moderate Assistance - Patient 50 - 74%     Toileting Toileting    Toileting assist Assist for toileting: Minimal Assistance - Patient > 75%     Transfers Chair/bed transfer  Transfers assist     Chair/bed transfer assist level: Minimal Assistance - Patient > 75% Chair/bed transfer assistive device: Programmer, multimedia   Ambulation assist      Assist level: Minimal Assistance - Patient > 75% Assistive device: Walker-rolling Max distance: 85   Walk 10  feet activity   Assist     Assist level: Minimal Assistance - Patient > 75% Assistive device: Walker-rolling   Walk 50 feet activity   Assist    Assist level: Minimal Assistance - Patient > 75% Assistive device: Walker-rolling    Walk 150 feet activity   Assist Walk 150 feet activity did not occur: Safety/medical concerns         Walk 10 feet on uneven surface  activity   Assist Walk 10 feet on uneven surfaces activity did not occur: Safety/medical concerns         Wheelchair     Assist Will patient use wheelchair at discharge?: No Type of Wheelchair: Manual    Wheelchair assist level: Minimal Assistance  - Patient > 75% Max wheelchair distance: 150    Wheelchair 50 feet with 2 turns activity    Assist        Assist Level: Minimal Assistance - Patient > 75%   Wheelchair 150 feet activity     Assist      Assist Level: Minimal Assistance - Patient > 75%   Medical Problem List and Plan: 1.Decreased functional mobility with altered mental statussecondary to chronic, 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 Status post bur hole 11/21/2020  Continue CIR 2. Antithrombotics: -DVT/anticoagulation:Subcutaneous heparin on hold -antiplatelet therapy: N/A 3. Pain Management:Tylenol as needed 4. Mood:Provide emotional support -antipsychotic agents: N/A 5. Neuropsych: This patientisnot quitecapable of making decisions onhisown behalf. 6. Skin/Wound Care:Routine skin checks - staples out  7. Fluids/Electrolytes/Nutrition:Routine in and outs with follow-up chemistries 8. Seizure disorder. Depakote 250 mg every 6 hours  No breakthrough seizures in CIR 9. Atrial fibrillation. Cardiac rate controlled. Eliquis on hold due to SDH x4 to 6 weeks. Reg rhythm today on exam. Vitals:   12/08/20 1941 12/09/20 0457  BP: (!) 141/76 (!) 151/82  Pulse: 66 60  Resp: 17 18  Temp: 98.4 F (36.9 C) 98.6 F (37 C)  SpO2: 99% 97%   Rate controlled on 3/7 10. CKD stage III  Creatinine 1.31 on 3/1, labs ordered for tomorrow 11. Ascending thoracic aortic aneurysm. Follow-up outpatient Dr. Caffie Pinto 12. 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 Follow-up outpatient cardiology services Dr. [name] 13. BPH. Proscar 5 mg daily.  14. History of OSA. Patient not on CPAP. 15. Dysphagia. Dysphagia #3 thins liquids.   Continue to advance diet as tolerated  16.  Hematuria reported by nsg last noc, has UTI pansensitive e coli, also holding heparin due to low PLT  Hemoglobin 12.8 on 2/6, continue to  monitor  Continue Keflex 17.  Thrombocytopenia  Platelets 54 on 2/6,?  Improving, labs ordered for tomorrow  LOS: 7 days A FACE TO FACE EVALUATION WAS PERFORMED  Celestina Gironda Lorie Phenix 12/09/2020, 12:45 PM

## 2020-12-09 NOTE — Progress Notes (Signed)
Occupational Therapy Session Note  Patient Details  Name: Cesar [name] MRN: [id number] Date of Birth: 10/26/1928  Today's Date: 12/09/2020 OT Individual Time: 8887-5797 OT Individual Time Calculation (min): 42 min    Short Term Goals: Week 1:  OT Short Term Goal 1 (Week 1): Pt will complete LB bathing sit to stand with min assist for two consecutive sessions. OT Short Term Goal 2 (Week 1): Pt will complete LB dressing with min assist sit to stand for two consecutive sessions. OT Short Term Goal 3 (Week 1): Pt will complete toilet transfers with min assist using the RW for support.   Skilled Therapeutic Interventions/Progress Updates:    Pt greeted at time of session supine in bed resting with wife present, no pain. Pt very HOH, had to increase volume throughout session. Supine > sit Supervision and stand pivot to wheelchair Min A overall, CGA for sit > stand. Donned shoes Mod/Max with encouragement to attempt himself. Wheelchair transport to gym and performed SCIFIT seated for 6 minutes on level 3 switching directions half way through. Performed BITS standing x3 rounds of 1-3 minute intervals for dynamic standing reaching out of BOS with unilateral support, switching UE. Rebounder 2x15 hits seated before 1x20 standing, Min/CGA for standing balance. Once back in room, stand pivot to bed Min/CGA and alarm on call bell in reach.   Therapy Documentation Precautions:  Precautions Precautions: Fall Precaution Comments: HOH, expressive difficulty Restrictions Weight Bearing Restrictions: No Other Position/Activity Restrictions: HOH, does have hearing aides     Therapy/Group: Individual Therapy  [name] 12/09/2020, 7:26 AM

## 2020-12-09 NOTE — Progress Notes (Signed)
Speech Language Pathology Daily Session Note  Patient Details  Name: Cesar [name] MRN: [id number] Date of Birth: Jan 24, 1929  Today's Date: 12/09/2020 SLP Individual Time: 1415-1457 SLP Individual Time Calculation (min): 42 min  Short Term Goals: Week 1: SLP Short Term Goal 1 (Week 1): Pt will verbally recall 1 safety precaution with Mod A cues for aids/strategies for recall. SLP Short Term Goal 2 (Week 1): Pt will orient to date and situation with Min A cue for use of aids/strategies. SLP Short Term Goal 3 (Week 1): Pt will demonstrate ability to problem solve basic familiar tasks with Min A verbal and visual cues. SLP Short Term Goal 4 (Week 1): Pt will demonstrate awareness of functional errors with Mod A multimodal cues. SLP Short Term Goal 5 (Week 1): Pt will use compensatory strategies to increase speech intelligibility to 80% at sentence level with Mod A multimodal cues. SLP Short Term Goal 6 (Week 1): Pt will demonstrate efficient mastication and oral clearance of dysphagia 3 textures with Min A for use of compensatory swallow strategies (slow rate, small bites/sips, single sips of drink, clearance of pocketing).  Skilled Therapeutic Interventions: Pt was seen for skilled ST targeting cognitive goals. Pt's wife and daughter were present for session today. Pt with some functional improvements in use of calendar for orientation today (used Mod I to determine date). During a basic 3-step action picture card sequencing task, pt required Min A verbal and visual cues for error awareness and problem solving to sequence steps of basic ADLs. When difficulty level increased to semi-complex and 4-steps (instead of 3) pt required Mod A verbal and visual cues for functional problem solving, reasoning, and error awareness. Of note, pt's verbal problem solving was more in tact than functional. Pt left laying in bed with alarm set and needs within reach. Continue per current plan of care.           Pain Pain Assessment Pain Scale: 0-10 Pain Score: 0-No pain  Therapy/Group: Individual Therapy  Arbutus Leas 12/09/2020, 7:25 AM

## 2020-12-09 NOTE — Plan of Care (Signed)
  Problem: RH Expression Communication Goal: LTG Patient will increase speech intelligibility (SLP) Description: LTG: Patient will increase speech intelligibility at word/phrase/conversation level with cues, % of the time (SLP) Outcome: Not Applicable Note: Goal is no longer applicable - day after evaluation pt's speech became more intelligible. He does have accent due to hearing loss, but this is not dysarthria s/p acute CVA and not indicated for skilled ST treatment at this time.

## 2020-12-09 NOTE — Progress Notes (Signed)
Occupational Therapy Session Note  Patient Details  Name: Cesar [name] MRN: [id number] Date of Birth: [birth year]  Today's Date: 12/09/2020 OT Individual Time: 1035-1105 OT Individual Time Calculation (min): 30 min    Short Term Goals: Week 1:  OT Short Term Goal 1 (Week 1): Pt will complete LB bathing sit to stand with min assist for two consecutive sessions. OT Short Term Goal 2 (Week 1): Pt will complete LB dressing with min assist sit to stand for two consecutive sessions. OT Short Term Goal 3 (Week 1): Pt will complete toilet transfers with min assist using the RW for support.  Skilled Therapeutic Interventions/Progress Updates:    Pt complete transfer from the wheelchair to the therapy mat with min assist using the RW for support.  Mod demonstrational cueing for hand placement with sit to stand and stand to sit as he wants to just hang onto the walker instead of reaching back.  Had him complete three PVC pipe puzzles in sitting following diagram.  First one therapist had him replicate a design with four pieces that therapist put together.  He was able to complete task but needed min questioning cueing to realize one error that he had and then correct.  He then completed the next one with min questioning cueing using more than 10 pieces.  The final one had more than 15 pieces and he was able to complete following diagram without any errors.  Finished session with transfer back to the wheelchair and then back to the bed with min assist.  Call button and phone in reach with bed alarm in place and pt's spouse in the room as well.   Therapy Documentation Precautions:  Precautions Precautions: Fall Precaution Comments: HOH, expressive difficulty Restrictions Weight Bearing Restrictions: No Other Position/Activity Restrictions: HOH, does have hearing aides  Pain: Pain Assessment Pain Scale: Faces Pain Score: 0-No pain ADL: See Care Tool Section for some details of mobility and  selfcare  Therapy/Group: Individual Therapy  MCGUIRE,JAMES OTR/L 12/09/2020, 12:36 PM

## 2020-12-09 NOTE — Progress Notes (Signed)
Physical Therapy Session Note  Patient Details  Name: Cesar [name] MRN: [id number] Date of Birth: 1929-03-18  Today's Date: 12/09/2020 PT Individual Time: 0900-0955 PT Individual Time Calculation (min): 55 min   Short Term Goals: Week 1:  PT Short Term Goal 1 (Week 1): Patient will complete bed mobility with supervision PT Short Term Goal 2 (Week 1): Patient will transfer bed <> wc with MinA and LRAD PT Short Term Goal 3 (Week 1): Patient will ambulate 80ft with LRAD and MinA PT Short Term Goal 4 (Week 1): Patient will negotiate 2 steps with 1 HR and MinA  Skilled Therapeutic Interventions/Progress Updates:    Patient received reclined in bed, asleep, but easy to wake, agreeable to PT. He denies pain. He was able to come sit edge of bed with supervision and use of bed rails + HOB elevated. PT donning B hearing aids, which appeared to be on and functioning. Patient appearing to be able to hear PT better today. He transferred to wc via stand pivot with HHA and MinA. He continues to try to sit prior to being fully lined up with chair. PT transporting patient to therapy gym for time management and energy conservation. He was able to ambulate 53ft with RW and MinA with blue theraband across width of RW to encourage anterior weight shift and upright posture within walker frame. Much improved gait mechanics noted from previous therapy sessions. Patient standing at table with toes on wedge of encourage further anterior weight shift reaching outside base of support to place tabs on magnetic board. Patient completing same task without wedge present and was able to maintain neutral weight shift with weight evenly distributed throughout feet. No excessive posterior lean noted. CGA provided for postural stability throughout task. Patient noted to be soaked in urine during standing task. PT transport back to patient room. He was able to doff dirty shirt and complete limited upper body bathing seated at sink with  ModA and max verbal cues for completion. Patient standing at sink to doff dirty pants with MaxA for clothing management and MinA for postural support. MaxA to don clean pants. Patient agreeable to remain up in chair at end of session. Chair alarm on, call light within reach, wife at bedside.    Therapy Documentation Precautions:  Precautions Precautions: Fall Precaution Comments: HOH, expressive difficulty Restrictions Weight Bearing Restrictions: No Other Position/Activity Restrictions: HOH, does have hearing aides    Therapy/Group: Individual Therapy  Karoline Caldwell, PT, DPT, CBIS  12/09/2020, 7:36 AM

## 2020-12-10 DIAGNOSIS — S065X0D Traumatic subdural hemorrhage without loss of consciousness, subsequent encounter: Secondary | ICD-10-CM | POA: Diagnosis not present

## 2020-12-10 LAB — BASIC METABOLIC PANEL WITH GFR
Anion gap: 7 (ref 5–15)
BUN: 27 mg/dL — ABNORMAL HIGH (ref 8–23)
CO2: 24 mmol/L (ref 22–32)
Calcium: 8.5 mg/dL — ABNORMAL LOW (ref 8.9–10.3)
Chloride: 103 mmol/L (ref 98–111)
Creatinine, Ser: 1.49 mg/dL — ABNORMAL HIGH (ref 0.61–1.24)
GFR, Estimated: 45 mL/min — ABNORMAL LOW
Glucose, Bld: 101 mg/dL — ABNORMAL HIGH (ref 70–99)
Potassium: 4.5 mmol/L (ref 3.5–5.1)
Sodium: 134 mmol/L — ABNORMAL LOW (ref 135–145)

## 2020-12-10 LAB — CBC WITH DIFFERENTIAL/PLATELET
Abs Immature Granulocytes: 0.13 10*3/uL — ABNORMAL HIGH (ref 0.00–0.07)
Basophils Absolute: 0.1 10*3/uL (ref 0.0–0.1)
Basophils Relative: 1 %
Eosinophils Absolute: 0.3 10*3/uL (ref 0.0–0.5)
Eosinophils Relative: 4 %
HCT: 35.7 % — ABNORMAL LOW (ref 39.0–52.0)
Hemoglobin: 11.7 g/dL — ABNORMAL LOW (ref 13.0–17.0)
Immature Granulocytes: 2 %
Lymphocytes Relative: 16 %
Lymphs Abs: 1.1 10*3/uL (ref 0.7–4.0)
MCH: 32.1 pg (ref 26.0–34.0)
MCHC: 32.8 g/dL (ref 30.0–36.0)
MCV: 97.8 fL (ref 80.0–100.0)
Monocytes Absolute: 0.8 10*3/uL (ref 0.1–1.0)
Monocytes Relative: 11 %
Neutro Abs: 4.5 10*3/uL (ref 1.7–7.7)
Neutrophils Relative %: 66 %
Platelets: 55 10*3/uL — ABNORMAL LOW (ref 150–400)
RBC: 3.65 MIL/uL — ABNORMAL LOW (ref 4.22–5.81)
RDW: 13.2 % (ref 11.5–15.5)
WBC: 6.8 10*3/uL (ref 4.0–10.5)
nRBC: 0 % (ref 0.0–0.2)

## 2020-12-10 LAB — GLUCOSE, CAPILLARY
Glucose-Capillary: 99 mg/dL (ref 70–99)
Glucose-Capillary: 90 mg/dL (ref 70–99)
Glucose-Capillary: 119 mg/dL — ABNORMAL HIGH (ref 70–99)
Glucose-Capillary: 112 mg/dL — ABNORMAL HIGH (ref 70–99)

## 2020-12-10 MED ORDER — SODIUM CHLORIDE 0.9 % IV SOLN: 250.0000 mL | INTRAVENOUS | Status: DC | PRN

## 2020-12-10 MED ORDER — SODIUM CHLORIDE 0.9% FLUSH
3.0000 mL | Freq: Two times a day (BID) | INTRAVENOUS | Status: DC
  Administered 2020-12-14 – 2020-12-15 (×2): 3 mL via INTRAVENOUS

## 2020-12-10 MED ORDER — SODIUM CHLORIDE 0.9% FLUSH: 3.0000 mL | INTRAVENOUS | Status: DC | PRN

## 2020-12-10 NOTE — Progress Notes (Signed)
Occupational Therapy Weekly Progress Note  Patient Details  Name: Cesar [name] MRN: [id number] Date of Birth: 1929-03-09  Beginning of progress report period: December 03, 2020 End of progress report period: December 10, 2020  Today's Date: 12/10/2020 OT Individual Time: 0800-0902 OT Individual Time Calculation (min): 62 min    Patient has met 2 of 3 short term goals.  Cesar [name] is making steady progress with OT at this time.  He is able to complete UB selfcare in sitting with supervision as well as LB selfcare sit to stand at min assist level overall.  He continues to need mod instructional cueing for safety and for sequencing throughout session.  With transfers, he is able to complete stand pivots with the RW to the shower bench, wheelchair, and BSC with min assist.  He needs max instructional cueing for hand placement during sit to stand and stand to sit as he wants to hold onto the walker.  In addition, he needs cueing to stay inside of the RW as well, and bring it all the way back with him and align up with the surface when sitting down.  He has improved some with sequencing of tasks such as dressing, but continues to demonstrate decreased emergent and anticipatory awareness during functional transitions.  Feel he will continue to benefit from CIR level therapies at this time in order to reach supervision level goals overall for return home with his spouse.  Anticipated discharge is 3/22.   Patient continues to demonstrate the following deficits: muscle weakness, impaired timing and sequencing, unbalanced muscle activation and decreased coordination, decreased initiation, decreased attention, decreased awareness, decreased problem solving, decreased safety awareness, decreased memory and delayed processing and decreased sitting balance, decreased standing balance, decreased postural control and decreased balance strategies and therefore will continue to benefit from skilled OT intervention to enhance  overall performance with BADL and Reduce care partner burden.  Patient progressing toward long term goals..  Continue plan of care.  OT Short Term Goals Week 2:  OT Short Term Goal 1 (Week 2): Pt will complete LB dressing with min assist sit to stand for two consecutive sessions. OT Short Term Goal 2 (Week 2): Pt will sequence through showering task with no more than min instructional cueing for thoroughness. OT Short Term Goal 3 (Week 2): Pt will complete LB dressing sit to stand with min guard assist for two consecutive sessions.  Skilled Therapeutic Interventions/Progress Updates:    Pt worked on shower and dressing to begin session.  He was able to transfer to the EOB with min guard assist in preparation for transfer to the shower.  Min assist was needed for ambulation to the shower with use of the RW.  Mod demonstrational cueing is still needed for hand placement on the bed for sit to stand as well as for reaching back to the surface of the seat when sitting.  He needed mod instructional cueing for sequencing bathing secondary to decreased thoroughness.  He was then able to transfer out to the sink in the wheelchair for dressing tasks.  Mod assist for threading his brief over his feet with min assist for standing to pull it over his hips.  Min assist was needed for donning his pants with max assist for tying the string.  He was able to donn his pullover shirt and his velcro shoes with supervision however.  Therapist provided total assist for TEDs.  He was able to doff and donn his watch on the left arm  using the right hand as well as putting in his hearing aides.  Therapist took him down to the dayroom where he transferred to the Nustep for strengthening of the RUE and RLE.  He completed 3 sets on level 6 resistance averaging 45-51 steps per min.  First set was completed around 5 mins with the next 2 being 2.5 mins each.  Finished session with transport back to the room and pt transferring to the bed  to rest with min assist.  Call button and phone in reach with safety alarm on and his daughter present.    Therapy Documentation Precautions:  Precautions Precautions: Fall Precaution Comments: HOH, expressive difficulty Restrictions Weight Bearing Restrictions: No Other Position/Activity Restrictions: HOH, does have hearing aides  Pain: Pain Assessment Pain Scale: 0-10 Pain Score: 0-No pain ADL: See Care Tool Section for some details of mobility and selfcare  Therapy/Group: Individual Therapy  MCGUIRE,JAMES OTR/L 12/10/2020, 12:39 PM

## 2020-12-10 NOTE — Progress Notes (Signed)
PROGRESS NOTE   Subjective/Complaints:   ROS: Denies CP, SOB, N/V/D  Objective:   No results found. Recent Labs    12/08/20 1011 12/10/20 0524  WBC 6.3 6.8  HGB 12.8* 11.7*  HCT 39.9 35.7*  PLT 54* 55*   Recent Labs    12/10/20 0524  NA 134*  K 4.5  CL 103  CO2 24  GLUCOSE 101*  BUN 27*  CREATININE 1.49*  CALCIUM 8.5*    Intake/Output Summary (Last 24 hours) at 12/10/2020 4481 Last data filed at 12/10/2020 0800 Gross per 24 hour  Intake 437 ml  Output 600 ml  Net -163 ml     Pressure Injury 10/22/20 Buttocks Left;Mid Stage 2 -  Partial thickness loss of dermis presenting as a shallow open injury with a red, pink wound bed without slough. (Active)  10/22/20 1447  Location: Buttocks  Location Orientation: Left;Mid  Staging: Stage 2 -  Partial thickness loss of dermis presenting as a shallow open injury with a red, pink wound bed without slough.  Wound Description (Comments):   Present on Admission: Yes    Physical Exam: Vital Signs Blood pressure (!) 142/68, pulse 61, temperature 98.5 F (36.9 C), resp. rate 16, height 6\' 1"  (1.854 m), weight 82.3 kg, SpO2 100 %.  General: No acute distress Mood and affect are appropriate Heart: Regular rate and rhythm no rubs murmurs or extra sounds Lungs: Clear to auscultation, breathing unlabored, no rales or wheezes Abdomen: Positive bowel sounds, soft nontender to palpation, nondistended Extremities: No clubbing, cyanosis, or edema Skin: No evidence of breakdown, no evidence of rash Neurologic: Cranial nerves II through XII intact, motor strength is 5/5 in bilateral deltoid, bicep, tricep, grip, hip flexor, knee extensors, ankle dorsiflexor and plantar flexor   Musc: No edema in extremities.  No tenderness in extremities. Neurologic: Alert and oriented x3 HOH Motor: 5/5 throughout Assessment/Plan: 1. Functional deficits which require 3+ hours per day of  interdisciplinary therapy in a comprehensive inpatient rehab setting.  Physiatrist is providing close team supervision and 24 hour management of active medical problems listed below.  Physiatrist and rehab team continue to assess barriers to discharge/monitor patient progress toward functional and medical goals  Care Tool:  Bathing    Body parts bathed by patient: Right arm,Left arm,Chest,Abdomen,Right upper leg,Left upper leg,Left lower leg,Face,Front perineal area,Right lower leg   Body parts bathed by helper: Buttocks     Bathing assist Assist Level: Minimal Assistance - Patient > 75%     Upper Body Dressing/Undressing Upper body dressing   What is the patient wearing?: Pull over shirt    Upper body assist Assist Level: Supervision/Verbal cueing    Lower Body Dressing/Undressing Lower body dressing      What is the patient wearing?: Pants,Incontinence brief     Lower body assist Assist for lower body dressing: Moderate Assistance - Patient 50 - 74%     Toileting Toileting    Toileting assist Assist for toileting: Minimal Assistance - Patient > 75%     Transfers Chair/bed transfer  Transfers assist     Chair/bed transfer assist level: Minimal Assistance - Patient > 75% Chair/bed transfer assistive device: Environmental consultant  Locomotion Ambulation   Ambulation assist      Assist level: Minimal Assistance - Patient > 75% Assistive device: Walker-rolling Max distance: 85   Walk 10 feet activity   Assist     Assist level: Minimal Assistance - Patient > 75% Assistive device: Walker-rolling   Walk 50 feet activity   Assist    Assist level: Minimal Assistance - Patient > 75% Assistive device: Walker-rolling    Walk 150 feet activity   Assist Walk 150 feet activity did not occur: Safety/medical concerns         Walk 10 feet on uneven surface  activity   Assist Walk 10 feet on uneven surfaces activity did not occur: Safety/medical  concerns         Wheelchair     Assist Will patient use wheelchair at discharge?: No Type of Wheelchair: Manual    Wheelchair assist level: Minimal Assistance - Patient > 75% Max wheelchair distance: 150    Wheelchair 50 feet with 2 turns activity    Assist        Assist Level: Minimal Assistance - Patient > 75%   Wheelchair 150 feet activity     Assist      Assist Level: Minimal Assistance - Patient > 75%   Medical Problem List and Plan: 1.Decreased functional mobility with altered mental statussecondary to chronic, 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 Status post bur hole 11/21/2020  Continue CIR PT, OT, SLP 2. Antithrombotics: -DVT/anticoagulation:Subcutaneous heparin on hold, ask pharm to suggest further management  -antiplatelet therapy: N/A 3. Pain Management:Tylenol as needed 4. Mood:Provide emotional support -antipsychotic agents: N/A 5. Neuropsych: This patientisnot quitecapable of making decisions onhisown behalf. 6. Skin/Wound Care:Routine skin checks - staples out  7. Fluids/Electrolytes/Nutrition:Routine in and outs with follow-up chemistries 8. Seizure disorder. Depakote 250 mg every 6 hours  No breakthrough seizures in CIR 9. Atrial fibrillation. Cardiac rate controlled. Eliquis on hold due to SDH x4 to 6 weeks. Reg rhythm today on exam. Vitals:   12/09/20 1947 12/10/20 0350  BP: (!) 128/92 (!) 142/68  Pulse: 71 61  Resp: 16 16  Temp: 98.2 F (36.8 C) 98.5 F (36.9 C)  SpO2: 99% 100%   Rate controlled on 3/8 10. CKD stage III  Creatinine 1.31 on 3/1, labs ordered for tomorrow 11. Ascending thoracic aortic aneurysm. Follow-up outpatient Dr. Caffie Pinto 12. 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 Follow-up outpatient cardiology services Dr. [name] 13. BPH. Proscar 5 mg daily.  14. History of OSA. Patient not on CPAP. 15.  Dysphagia. Dysphagia #3 thins liquids.   Continue to advance diet as tolerated  16.  Hematuria reported by nsg last noc, has UTI pansensitive e coli, also holding heparin due to low PLT  Hemoglobin 12.8 on 2/6, continue to monitor  Continue Keflex 17.  Thrombocytopenia  Platelets reached nadir of ~30K, now ~50K, no signs of bleeding   LOS: 8 days A FACE TO FACE EVALUATION WAS PERFORMED  Charlett Blake 12/10/2020, 8:19 AM

## 2020-12-10 NOTE — Progress Notes (Signed)
Physical Therapy Weekly Progress Note  Patient Details  Name: Cesar [name] MRN: [id number] Date of Birth: [birth year]  Beginning of progress report period: December 03, 2020 End of progress report period: December 10, 2020  Today's Date: 12/10/2020 PT Individual Time: 7829-5621 PT Individual Time Calculation (min): 48 min  and Today's Date: 12/10/2020 PT Missed Time: 12 Minutes Missed Time Reason: Patient fatigue  Patient has met 3 of 4 short term goals.  Patient is making steady progress toward his goals. He is grossly MinA for functional mobility outside the bed. He remains limited by endurance, poor righting reactions, functional LE weakness and difficulty hearing. Daughter, who was present, reports that patient is functioning near baseline, but with RW and not SPC like he was prior to this admission.   Patient continues to demonstrate the following deficits muscle weakness, decreased cardiorespiratoy endurance, decreased motor planning and decreased sitting balance, decreased standing balance, decreased postural control and decreased balance strategies and therefore will continue to benefit from skilled PT intervention to increase functional independence with mobility.  Patient progressing toward long term goals..  Continue plan of care.  PT Short Term Goals Week 1:  PT Short Term Goal 1 (Week 1): Patient will complete bed mobility with supervision PT Short Term Goal 1 - Progress (Week 1): Met PT Short Term Goal 2 (Week 1): Patient will transfer bed <> wc with MinA and LRAD PT Short Term Goal 2 - Progress (Week 1): Met PT Short Term Goal 3 (Week 1): Patient will ambulate 55f with LRAD and MinA PT Short Term Goal 3 - Progress (Week 1): Progressing toward goal PT Short Term Goal 4 (Week 1): Patient will negotiate 2 steps with 1 HR and MinA PT Short Term Goal 4 - Progress (Week 1): Met Week 2:  PT Short Term Goal 1 (Week 2): Patient will ambulate 559fwith LRAD and MinA PT Short Term Goal 2  (Week 2): Patient will negotiate 2 steps with 1 HR and CGA PT Short Term Goal 3 (Week 2): Patient will transfer bed <> wc with CGA and LRAD  Skilled Therapeutic Interventions/Progress Updates:    Session 1: Patient received supine in bed, asleep but easy to wake. Dtr at bedside. He denies pain. Patient able to come sit edge of bed with supervision and don shoes with MaxA. Transferring to wc via stand pivot with MinA and HHA. Dtr requesting to attend session to evaluate if patient is near baseline for functional mobility. PT transporting patient to therapy gym for time management and energy conservation. He was able to ambulate [id number] RW, CGA/MinA and blue theraband across width of walker to encourage remaining within frame. PT with difficulty cuing patient on gait mechanics throughout session since patients hearing aids were found to be dead. With fatigue, patient assuming very flexed posture and increasing distance from walker frame. Patient maintains very narrow BOS and shuffling B LE, L>R. Patient minimally responsive to cues, likely due to hearing.He was able to complete step over hockey stick with B UE support on RW + CGA to enforce increased foot clearance and increased step length. Dtr reporting that patients gait pattern appears to be very close to his baseline, except ambulating with RW and not SPC. Though she notes that he did not appear to be very safe walking with SPC prior to this admission. Daughter with multiple questions regarding follow up therapy. She reports that patient previously refused HH [place] recent TAVR procedure and that he may benefit from OP services  to ensure that he received follow up therapy. PT emphasizing that patient will benefit from follow up therapy services, in whatever form he would agree to, to ensure safe mobility at home and continued progress. PT informing SW that dtr would like to discuss follow up care after dc. Patient requesting to return to his room due to  fatigue. PT and dtr unable to redirect patient. He returned to room in wc, chair alarm on, call light within reach.    Session 2: Patient received supine in bed, agreeable to PT. He denies pain. Patient able to come sit edge of bed with supervision and transfer to wc via stand pivot with MinA. PT transporting patient to therapy gym for time management and energy conservation. He was able to ambulate 66f x4 with seated rest breaks between bouts. RW + CGA/MinA with cues to minimize forward flexed posture and increase L foot clearance and step length. Patient able to respond to commands and make corrections, but could only maintain for ~5 steps before resorting back to forward flexed posture and shuffling gait pattern. Dual cog + motor standing task with intermittent U UE support constructing block structure based on picture. Patient requiring Max verbal cues for level 2 structure, but was able to complete level 1 structure without verbal cues. Patient with limited tolerance for frustration. He returned to room in wc, transferring back to bed via stand pivot with MinA. Bed alarm on, call light within reach.   Therapy Documentation Precautions:  Precautions Precautions: Fall Precaution Comments: HOH, expressive difficulty Restrictions Weight Bearing Restrictions: No Other Position/Activity Restrictions: HOH, does have hearing aides   Therapy/Group: Individual Therapy  JKaroline Caldwell PT, DPT, CBIS  12/10/2020, 7:30 AM

## 2020-12-10 NOTE — Progress Notes (Signed)
Speech Language Pathology Weekly Progress and Session Note  Patient Details  Name: Cesar [name] MRN: [id number] Date of Birth: 1928-10-08  Beginning of progress report period: December 03, 2020 End of progress report period: December 10, 2020  Today's Date: 12/10/2020 SLP Individual Time: 3154-0086 SLP Individual Time Calculation (min): 25 min  Short Term Goals: Week 1: SLP Short Term Goal 1 (Week 1): Pt will verbally recall 1 safety precaution with Mod A cues for aids/strategies for recall. SLP Short Term Goal 1 - Progress (Week 1): Met SLP Short Term Goal 2 (Week 1): Pt will orient to date and situation with Min A cue for use of aids/strategies. SLP Short Term Goal 2 - Progress (Week 1): Met SLP Short Term Goal 3 (Week 1): Pt will demonstrate ability to problem solve basic familiar tasks with Min A verbal and visual cues. SLP Short Term Goal 3 - Progress (Week 1): Progressing toward goal SLP Short Term Goal 4 (Week 1): Pt will demonstrate awareness of functional errors with Mod A multimodal cues. SLP Short Term Goal 4 - Progress (Week 1): Met SLP Short Term Goal 5 (Week 1): Pt will use compensatory strategies to increase speech intelligibility to 80% at sentence level with Mod A multimodal cues. SLP Short Term Goal 5 - Progress (Week 1): Discontinued (comment) (n/a - see plan of care) SLP Short Term Goal 6 (Week 1): Pt will demonstrate efficient mastication and oral clearance of dysphagia 3 textures with Min A for use of compensatory swallow strategies (slow rate, small bites/sips, single sips of drink, clearance of pocketing). SLP Short Term Goal 6 - Progress (Week 1): Met    New Short Term Goals: Week 2: SLP Short Term Goal 1 (Week 2): Pt will consume trials of regular texture solids demonstrating efficient mastication, oral clearance, and safe rate of intake with Min cues from clinician prior to advancement. SLP Short Term Goal 2 (Week 2): Pt will demonstrate awareness of functional errors  with Min A multimodal cues. SLP Short Term Goal 3 (Week 2): Pt will demonstrate ability to problem solve basic familiar tasks with Min A verbal and visual cues. SLP Short Term Goal 4 (Week 2): Pt will recall new and/or daily information with Supervision A verbal cues for use of comepnsatory strategies and/or aids.  Weekly Progress Updates: Pt has made functional gains and met 4 out of 6 short term goals. Pt is currently Min-Mod assist for basic familiar tasks due to cognitive impairments impacting his problem solving, frustration tolerance, emergent awareness, and short term memory. Pt has mild oral dysphagia and is consuming a dysphagia 3 texture diet with thin liquids with minimal overt s/sx aspiration and Min A cue for use of safe swallowing strategies. Pt's goal for speech intelligibility was discontinued, as pt no longer presents with dysarthria. He does have significant hearing loss, which can at times produce a speech difference, but not dysarthria, and does not imoact intelligibility. Pt has demonstrated improved orientation, basic familiar problem solving, and carryover of new daily information. Pt and family education is ongoing. Pt would continue to benefit from skilled ST while inpatient in order to maximize functional independence and reduce burden of care prior to discharge. Anticipate that pt will need 24/7 supervision at discharge in addition to [facility] follow up at next level of care.      Intensity: Minumum of 1-2 x/day, 30 to 90 minutes Frequency: 3 to 5 out of 7 days Duration/Length of Stay: 12/24/20 Treatment/Interventions: Cognitive remediation/compensation;Cueing hierarchy;Dysphagia/aspiration precaution training;Functional  tasks;Patient/family education;Internal/external aids;Speech/Language facilitation   Daily Session  Skilled Therapeutic Interventions: Pt was seen for skilled ST targeting dysphagia and cognitive goals. SLP facilitated session with skilled observation of pt  consuming current diet Dys 3 breakfast tray with thin liquids. Pt's rate of intake was still mildly faster than would be considered WFL, however no overt s/sx aspiration noted across solids or liquids and pt cleared all solids from oral cavity without cues. Would recommend continue current diet and would be appropriate to begin trials of regular textures with SLP in the near future. Pt was oriented to date with Supervision A verbal cues to attend to calendar aid posted on his wall. He was independently oriented to self, place, and general situation. Pt required Min A verbal and visual cues for problem solving when using daily therapy schedule to identify and anticipate his appointments for the day. Increased Mid faded to Min A verbal and visual cues and use of external memory aid required for pt to recall and discriminate differences/main targets of each therapy discipline. Pt left laying in bed with alarm set and needs within reach, RN present. Continue per current plan of care.        Pain Pain Assessment Pain Scale: 0-10 Pain Score: 0-No pain  Therapy/Group: Individual Therapy  Arbutus Leas 12/10/2020, 8:00 AM

## 2020-12-10 NOTE — Progress Notes (Signed)
Pharmacy Heparin Induced Thrombocytopenia (HIT) Note:  Cesar [name] is an 87 y.o. male being evaluated for HIT. Heparin was started 2/22 for VTE prophylaxis, and baseline platelets were 130s.   Platelets dropped to 33 on 3/4 and heparin was stopped.  HIT labs were ordered on 3/8.  Will follow-up with optical density when available.  Auto-populate labs:  Heparin Induced Plt Ab  Date/Time Value Ref Range Status  12/10/2020 11:37 AM 0.111 0.000 - 0.400 OD Final    Comment:    (NOTE) Performed At: [facility] [place], [place] [id number] Rush Farmer MD EH:[id number]      CALCULATE SCORE:  4Ts (see the HIT Algorithm) Score  Thrombocytopenia 2  Timing 1  Thrombosis 0  Other causes of thrombocytopenia 0  Total 3     Recommendations (A or B) are based on available lab results (HIT antibody and/or SRA) and the HIT algorithm    A. HIT antibody result available >> 0.111  HIT ruled out    No SRA needed  No allergy documentation needed   Name of MD Contacted: Dr. Letta Pate  Plan (Discussed with provider) Labs ordered:  SRA not needed  Heparin allergy:  No allergy documentation needed. Anticoagulation plans:  N/A.  Spoke with MD, no chemical VTE prophylaxis needed as this time.  Pt walking with therapy.  [employer], Pharm.D., BCPS Clinical Pharmacist  **Pharmacist phone directory can be found on [email or web address] listed under [place].  12/12/2020 11:50 AM

## 2020-12-11 DIAGNOSIS — S065X0D Traumatic subdural hemorrhage without loss of consciousness, subsequent encounter: Secondary | ICD-10-CM | POA: Diagnosis not present

## 2020-12-11 LAB — GLUCOSE, CAPILLARY
Glucose-Capillary: 97 mg/dL (ref 70–99)
Glucose-Capillary: 105 mg/dL — ABNORMAL HIGH (ref 70–99)
Glucose-Capillary: 97 mg/dL (ref 70–99)
Glucose-Capillary: 98 mg/dL (ref 70–99)

## 2020-12-11 LAB — HEPARIN INDUCED PLATELET AB (HIT ANTIBODY): Heparin Induced Plt Ab: 0.111 OD (ref 0.000–0.400)

## 2020-12-11 NOTE — Progress Notes (Signed)
Speech Language Pathology Daily Session Note  Patient Details  Name: Cesar [name] MRN: [id number] Date of Birth: 1929-03-31  Today's Date: 12/11/2020 SLP Individual Time: 5997-7414 SLP Individual Time Calculation (min): 24 min  Short Term Goals: Week 2: SLP Short Term Goal 1 (Week 2): Pt will consume trials of regular texture solids demonstrating efficient mastication, oral clearance, and safe rate of intake with Min cues from clinician prior to advancement. SLP Short Term Goal 2 (Week 2): Pt will demonstrate awareness of functional errors with Min A multimodal cues. SLP Short Term Goal 3 (Week 2): Pt will demonstrate ability to problem solve basic familiar tasks with Min A verbal and visual cues. SLP Short Term Goal 4 (Week 2): Pt will recall new and/or daily information with Supervision A verbal cues for use of comepnsatory strategies and/or aids.  Skilled Therapeutic Interventions: Pt was seen for skilled ST targeting cognitive goals. Pt's daughter was present for session. Pt was fatigued and required Moderate encouragement to participate today. SLP facilitated session with a basic calendar making task, during which pt demonstrated problem solving and sequencing abilities with Min faded Supervision A verbal and visual cues. He was grossly oriented to date today without cues (off by 1 day). Pt left laying in bed with alarm set and needs within reach. Continue per current plan of care.          Pain Pain Assessment Pain Scale: Faces Faces Pain Scale: No hurt  Therapy/Group: Individual Therapy  Arbutus Leas 12/11/2020, 7:19 AM

## 2020-12-11 NOTE — Progress Notes (Addendum)
PROGRESS NOTE   Subjective/Complaints: No issues overnite , discussed throbocytopenia with phamacy, HIT panel pnd, no gross hematuria   ROS: Denies CP, SOB, N/V/D  Objective:   No results found. Recent Labs    12/08/20 1011 12/10/20 0524  WBC 6.3 6.8  HGB 12.8* 11.7*  HCT 39.9 35.7*  PLT 54* 55*   Recent Labs    12/10/20 0524  NA 134*  K 4.5  CL 103  CO2 24  GLUCOSE 101*  BUN 27*  CREATININE 1.49*  CALCIUM 8.5*    Intake/Output Summary (Last 24 hours) at 12/11/2020 0750 Last data filed at 12/11/2020 0730 Gross per 24 hour  Intake 1040 ml  Output 525 ml  Net 515 ml     Pressure Injury 10/22/20 Buttocks Left;Mid Stage 2 -  Partial thickness loss of dermis presenting as a shallow open injury with a red, pink wound bed without slough. (Active)  10/22/20 1447  Location: Buttocks  Location Orientation: Left;Mid  Staging: Stage 2 -  Partial thickness loss of dermis presenting as a shallow open injury with a red, pink wound bed without slough.  Wound Description (Comments):   Present on Admission: Yes    Physical Exam: Vital Signs Blood pressure 132/73, pulse (!) 51, temperature 98.4 F (36.9 C), resp. rate 16, height 6\' 1"  (1.854 m), weight 82.3 kg, SpO2 99 %.  General: No acute distress Mood and affect are appropriate Heart: Regular rate and rhythm no rubs murmurs or extra sounds Lungs: Clear to auscultation, breathing unlabored, no rales or wheezes Abdomen: Positive bowel sounds, soft nontender to palpation, nondistended Extremities: No clubbing, cyanosis, or edema Skin: No evidence of breakdown, no evidence of rash Neurologic: Cranial nerves II through XII intact, motor strength is 5/5 in bilateral deltoid, bicep, tricep, grip, hip flexor, knee extensors, ankle dorsiflexor and plantar flexor   Musc: No edema in extremities.  No tenderness in extremities. Neurologic: Alert and oriented x3 HOH Motor:  5/5 throughout Assessment/Plan: 1. Functional deficits which require 3+ hours per day of interdisciplinary therapy in a comprehensive inpatient rehab setting.  Physiatrist is providing close team supervision and 24 hour management of active medical problems listed below.  Physiatrist and rehab team continue to assess barriers to discharge/monitor patient progress toward functional and medical goals  Care Tool:  Bathing    Body parts bathed by patient: Right arm,Left arm,Chest,Abdomen,Right upper leg,Left upper leg,Left lower leg,Face,Front perineal area,Right lower leg,Buttocks   Body parts bathed by helper: Buttocks     Bathing assist Assist Level: Minimal Assistance - Patient > 75%     Upper Body Dressing/Undressing Upper body dressing   What is the patient wearing?: Pull over shirt    Upper body assist Assist Level: Supervision/Verbal cueing    Lower Body Dressing/Undressing Lower body dressing      What is the patient wearing?: Pants,Incontinence brief     Lower body assist Assist for lower body dressing: Moderate Assistance - Patient 50 - 74%     Toileting Toileting    Toileting assist Assist for toileting: Minimal Assistance - Patient > 75%     Transfers Chair/bed transfer  Transfers assist     Chair/bed transfer  assist level: Minimal Assistance - Patient > 75% Chair/bed transfer assistive device: Museum/gallery exhibitions officer assist      Assist level: Minimal Assistance - Patient > 75% Assistive device: Walker-rolling Max distance: 45   Walk 10 feet activity   Assist     Assist level: Contact Guard/Touching assist Assistive device: Walker-rolling   Walk 50 feet activity   Assist    Assist level: Minimal Assistance - Patient > 75% Assistive device: Walker-rolling    Walk 150 feet activity   Assist Walk 150 feet activity did not occur: Safety/medical concerns         Walk 10 feet on uneven surface   activity   Assist Walk 10 feet on uneven surfaces activity did not occur: Safety/medical concerns         Wheelchair     Assist Will patient use wheelchair at discharge?: No Type of Wheelchair: Manual    Wheelchair assist level: Minimal Assistance - Patient > 75% Max wheelchair distance: 150    Wheelchair 50 feet with 2 turns activity    Assist        Assist Level: Minimal Assistance - Patient > 75%   Wheelchair 150 feet activity     Assist      Assist Level: Minimal Assistance - Patient > 75%   Medical Problem List and Plan: 1.Decreased functional mobility with altered mental statussecondary to chronic, 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 Status post bur hole 11/21/2020  Continue CIR PT, OT, SLP 2. Antithrombotics: -DVT/anticoagulation:Subcutaneous heparin on hold, ask pharm to suggest further management  -antiplatelet therapy: N/A 3. Pain Management:Tylenol as needed 4. Mood:Provide emotional support -antipsychotic agents: N/A 5. Neuropsych: This patientisnot quitecapable of making decisions onhisown behalf. 6. Skin/Wound Care:Routine skin checks - staples out  7. Fluids/Electrolytes/Nutrition:Routine in and outs with follow-up chemistries, BUN mildly increased but fluid intake better yesterday  8. Seizure disorder. Depakote 250 mg every 6 hours  No breakthrough seizures in CIR 9. Atrial fibrillation. Cardiac rate controlled. Eliquis on hold due to SDH x4 to 6 weeks. Reg rhythm today on exam. Vitals:   12/10/20 1959 12/11/20 0406  BP: (!) 115/54 132/73  Pulse: 60 (!) 51  Resp: 16 16  Temp: 98.8 F (37.1 C) 98.4 F (36.9 C)  SpO2: 90% 99%   BP controlled , rate with mild brady  10. CKD stage III  Creatinine 1.31 on 3/1, 3/9 cr 1.4 at baseline 11. Ascending thoracic aortic aneurysm. Follow-up outpatient Dr. Caffie Pinto 12. Severe aortic stenosis status post TAVR  procedure 10/22/2020. Follow-up outpatient cardiology services Dr. [name] 13. BPH. Proscar 5 mg daily.  14. History of OSA. Patient not on CPAP. 15. Dysphagia. Dysphagia #3 thins liquids.   Continue to advance diet as tolerated  16.  Hematuria reported by nsg last noc, has UTI pansensitive e coli, also holding heparin due to low PLT  Hemoglobin 12.8 on 3/6, down to 11.7 on 3/8 continue to monitor  Continue Keflex 17.  Thrombocytopenia  Platelets reached nadir of ~30K, now ~55K, no signs of bleeding   LOS: 9 days A FACE TO [place] E Haydn Hutsell 12/11/2020, 7:50 AM

## 2020-12-11 NOTE — Patient Care Conference (Signed)
Inpatient RehabilitationTeam Conference and Plan of Care Update Date: 12/11/2020   Time: 10:35 AM    Patient Name: Cesar [name]      Medical Record Number: [id number]  Date of Birth: 04/19/1929 Sex: Male         Room/Bed: 4W26C/4W26C-01 Payor Info: Payor: MEDICARE / Plan: MEDICARE PART A AND B / Product Type: *No Product type* /    Admit Date/Time:  12/02/2020  3:30 PM  Primary Diagnosis:  <principal problem not specified>  Hospital Problems: Active Problems:   Traumatic subdural hematoma ([place])   Dysphagia   Hematuria   Acute lower UTI    Expected Discharge Date: Expected Discharge Date: 12/19/20  Team Members Present: Physician leading conference: Dr. Alysia Penna Care Coodinator Present: Erlene Quan, BSW;Deborah Hervey Ard, RN, BSN, [place] Nurse Present: Pamella Pert) [place], LPN PT Present: Estevan Ryder, PT OT Present: Clyda Greener, OT SLP Present: Jettie Booze, CF-SLP PPS Coordinator present : Gunnar Fusi, Novella Olive, PT     Current Status/Progress Goal Weekly Team Focus  Bowel/Bladder   pt is cont/incont x2, LBM 12/10/20  Maintain continence  q2h toileting.   Swallow/Nutrition/ Hydration   dys 3 textures, thin liquids, Min A  Supervision  carryover swallow strategies, regular texture trials   ADL's   supervision for UB selfcare with min assist for LB selfcare sit to stand as well as toileting and shower transfers.  Needs cueing for safety and sequencing with transfers using the RW for support.  Decreased thoroughness with bathing.  Slight decreased coordination in the right hand but uses at a non-dominant level currently.  min guard to supervision  selfcare retraiing, transfer training, DME education, pt education, neuromuscular re-education, balance retraining   Mobility   SPV bed mobility, MinA/CGA STS and transfers (poor safety awareness remains), MinA gait up to 33ft RW, minimally receptive to verbal cues for gait mechanics (likely due to  hearing loss)  SPV/CGA overall  safety awareness, functional transfers, gait progressions, pt/fam ed, functional strength   Communication   n/a, HOH but no dysarthria anymore  n/a - goal d/c'd  n/a   Safety/Cognition/ Behavioral Observations  Min-Mod  Supervision-Min  basic familiar problem solving, safety awareness, intellectual and emergent awareness, recall/carryover   Pain   no c/o pain at this time.  maintain a pain goal below 3  assess pain qshift/prn   Skin   no signs of breakdown or infection at this time  No new breakdown  Assess skin qshift/prn     Discharge Planning:  Goal to d/c home with spouse and son to provide care up to [place], 24/7. 2 level home, 2 steps to enter   Team Discussion: S/P multi falls with [facility], HOH and Apraxia issues. UTI and is anxious to go home.   Patient on target to meet rehab goals: yes, doing well with D3 thin diet and min- mod assist for cognition. Requires cues for hand placement and right UE coordination deficits. Trouble with sequencing ; mod cues needed for thoroughness with hygiene. Progress limited by [facility], poor safety awareness. Currently supervision for upper body care and min assist for lower body care and min assist for transfers with supervision  - CGA set for discharge.  *See Care Plan and progress notes for long and short-term goals.   Revisions to Treatment Plan:   Teaching Needs: Transfers, toileting, hand placement, safety awareness cues, medications, etc.  Current Barriers to Discharge: Decreased caregiver support  Possible Resolutions to Barriers: Family education Recommendations for  private duty/hired help at discharge     Medical Summary Current Status: Hard of hearing even with hearing aids, apraxia, UTI, bowel and bladder continence improving  Barriers to Discharge: Medical stability;Neurogenic Bowel & Bladder   Possible Resolutions to Barriers/Weekly Focus: Finish antibiotic treatment of UTI, family training  prior to discharge, simplify medication regimen   Continued Need for Acute Rehabilitation Level of Care: The patient requires daily medical management by a physician with specialized training in physical medicine and rehabilitation for the following reasons: Direction of a multidisciplinary physical rehabilitation program to maximize functional independence : Yes Medical management of patient stability for increased activity during participation in an intensive rehabilitation regime.: Yes Analysis of laboratory values and/or radiology reports with any subsequent need for medication adjustment and/or medical intervention. : Yes   I attest that I was present, lead the team conference, and concur with the assessment and plan of the team.   Dorien Chihuahua B 12/11/2020, 2:40 PM

## 2020-12-11 NOTE — Progress Notes (Signed)
Physical Therapy Session Note  Patient Details  Name: Cesar [name] MRN: [id number] Date of Birth: 1929-04-13  Today's Date: 12/11/2020 PT Individual Time: 4765-4650 - 3546-5681 PT Individual Time Calculation (min): 24 min + 24 min  Short Term Goals: Week 2:  PT Short Term Goal 1 (Week 2): Patient will ambulate 64ft with LRAD and MinA PT Short Term Goal 2 (Week 2): Patient will negotiate 2 steps with 1 HR and CGA PT Short Term Goal 3 (Week 2): Patient will transfer bed <> wc with CGA and LRAD  Skilled Therapeutic Interventions/Progress Updates:   1st session:   Pt greeted supine in bed, daughter at bedside, pt agreeable to therapy session. Patient without hearing aids in - daughter reports the battery's are dead - charging at bedside. Supine<>sit with close supervision with use of bed rails. Donned shoes with totalA for time management. Stand<>pivot transfer with minA and no AD from EOB to w/c with cues needed for hand placement and transfer technique. W/c transport for time management to day room gym. Gait training 2x31ft with minA and RW - assist needd for rw management and general steadiness - cues provided for increasing Bilateral step length/height due to L>R foot dragging. Able to initiate corrections but difficulty maintaining. TUG completed x3 bouts with CGA/minA and RW - 40 seconds, 42 seconds ,and 46 seconds, respectively. TUG score > 13.5 seconds indicates increased falls risk. Pt returned to his room in w/c - agreeable to remain sitting in w/c at NT changed bed linen. Handoff of care to NT at end of session.  2nd session: Pt received in therapy gym sitting in w/c at start of session, hand off of care from OT. Stand<>pivot with CGA and RW from w/c to [place]. Completed 10 min at workload 4, using both BUE and BLE, focusing on reciprocal movement patterns and general strengthening, around ~40 steps/minute cadence. Stand<>pivot with minA and no AD back to w/c from [place]. Gait x50ft with  minA and RW with cues for improving awareness with RW management and general safety - shuffling steps that worsened with fatigue, and lack of terminal knee extension in stance of both LE's. Needing minA for controlled lowering to bed with RW. Sit>supine with minA for trunk management and doffed shoes with totalA. Remained semi-reclined in bed with needs in reach, bed alarm on, daughter at bedside. Updated daughter on pt's progress and how fatigue impacts functional mobility.   Therapy Documentation Precautions:  Precautions Precautions: Fall Precaution Comments: HOH, expressive difficulty Restrictions Weight Bearing Restrictions: No Other Position/Activity Restrictions: HOH, does have hearing aides  Therapy/Group: Individual Therapy  Selma Rodelo P Kerianne Gurr PT 12/11/2020, 7:43 AM

## 2020-12-11 NOTE — Progress Notes (Addendum)
Occupational Therapy Session Note  Patient Details  Name: Cesar [name] MRN: [id number] Date of Birth: 05-09-1929  Today's Date: 12/11/2020 OT Individual Time: 1302-1400 OT Individual Time Calculation (min): 58 min    Short Term Goals: Week 2:  OT Short Term Goal 1 (Week 2): Pt will complete LB dressing with min assist sit to stand for two consecutive sessions. OT Short Term Goal 2 (Week 2): Pt will sequence through showering task with no more than min instructional cueing for thoroughness. OT Short Term Goal 3 (Week 2): Pt will complete LB dressing sit to stand with min guard assist for two consecutive sessions.  Skilled Therapeutic Interventions/Progress Updates:    Pt sitting in the wheelchair to start session.  Discussion with pt and daughter on bathroom setup at home and DME needs to start.  She will purchase a tub bench for home from an outside vendor per discussion and he already has an elevated toilet as well as a 3:1.  Next, he worked on changing his pants per daughter request as the one's he had on were too big.  He was able to doff his current pull up pants with min assist and then donn the new ones at the same level.  He doffed and donned his velcro shoes as well at supervision level.  Next, he was then taken down to the dayroom where he transferred to a therapy mat with min assist using the RW for support and worked on standing balance with use of the [employer] to start.  He was able to complete sit to stand and maintain standing balance with min assist while tossing the bean bags with his right hand.  He ambulated without an assistive device to pick them up at mod assist level for reaching down to the floor.  Increased knee flexion noted with standing back up after reaching down, with pt attempting to turn and ambulate before standing all the way up.  He completed 3 intervals of this before transitioning to working on FM coordination in sitting.  Had him work on using the RUE to  [employer] in rows of 3.  Increased difficulty noted as when reaching, he would drag his hand across the surface of the blocks moving them around.  Increased frustration noted with task, so had him work on stacking them in a single row.  He was able to stack 15 of them without falling.  Finished session with ball toss and catch using BUEs.  He was able to consistently catch a small beach ball as well as a smaller heavier ball using BUEs when thrown at midline.  When thrown to either side he would hit it back instead of catching it.  Increased posterior pelvic tilt and posterior lean noted with activity.  Transferred back to the wheelchair at end of session with min assist and then he was left with PT present for next session.    Therapy Documentation Precautions:  Precautions Precautions: Fall Precaution Comments: HOH, expressive difficulty Restrictions Weight Bearing Restrictions: No Other Position/Activity Restrictions: HOH, does have hearing aides  Pain: Pain Assessment Pain Scale: Faces Pain Score: 0-No pain Faces Pain Scale: No hurt ADL: See Care Tool Section for some details of mobility and selfcare  Therapy/Group: Individual Therapy  MCGUIRE,Zyasia Halbleib OTR/L 12/11/2020, 3:43 PM

## 2020-12-11 NOTE — Progress Notes (Signed)
Patient ID: Cesar [name], male   DOB: [birth year], 87 y.o.   MRN: [id number] Team Conference Report to Patient/Family  Team Conference discussion was reviewed with the patient and caregiver, including goals, any changes in plan of care and target discharge date.  Patient and caregiver express understanding and are in agreement.  The patient has a target discharge date of 12/19/20.  Dyanne Iha 12/11/2020, 1:09 PM

## 2020-12-11 NOTE — Evaluation (Signed)
Recreational Therapy Assessment and Plan  Patient Details  Name: Cesar [name] MRN: [id number] Date of Birth: 1928/11/02 Today's Date: 12/11/2020  Rehab Potential:  Fair ELOS:   d/c 3//17  Assessment   Hospital Problem: Active Problems:   Traumatic subdural hematoma [facility])   Past Medical History:      Past Medical History:  Diagnosis Date  . AAA (abdominal aortic aneurysm) ([place])   . CKD (chronic kidney disease), stage III (HCC)    baseline Cr 1.4-1.5 in 2016-2017 by PCP)  . CVA (cerebral infarction)   . Depression   . DVT (deep venous thrombosis) ([place]) 2006  . H/O blood clots   . Hematuria   . HOH (hard of hearing)   . Hypercholesterolemia   . OSA (obstructive sleep apnea)    intol to CPAP  . Permanent atrial fibrillation ([place])   . Prostate enlargement   . Pulmonary nodules   . 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 Buena Irish and Dr. Roxy Manns  . Thoracic ascending aortic aneurysm ([place])    4.5cm in 11/2018, stable since 2016  . TIA (transient ischemic attack)    Past Surgical History:       Past Surgical History:  Procedure Laterality Date  . BURR HOLE Left 11/21/2020   Procedure: LEFT Side BURR HOLES,;  Surgeon: [name], MD;  Location: [facility];  Service: Neurosurgery;  Laterality: Left;  . EMBOLECTOMY Left 07/02/2016   Procedure: EMBOLECTOMY LEFT FEMORAL ARTERY;  Surgeon: [name], MD;  Location: [place] [facility];  Service: Vascular;  Laterality: Left;  Marland Kitchen MESENTERIC ARTERY BYPASS N/A 08/06/2019   Procedure: SUPERIOR MESENTERIC ARTERY THROMBECTOMY;  Surgeon: [name], MD;  Location: [place];  Service: Vascular;  Laterality: N/A;  . NM [place]  08/24/2002   markedly positive,subtle lateral ischemia  . PATCH ANGIOPLASTY  08/06/2019   Procedure: Patch Angioplasty of superior messenteric artery with RIGHT femoral vein;  Surgeon: [name], MD;  Location: [place];  Service: Vascular;;  . PROSTATE SURGERY    . RIGHT HEART CATH AND CORONARY ANGIOGRAPHY N/A 10/21/2020   Procedure: RIGHT HEART CATH AND CORONARY ANGIOGRAPHY;  Surgeon: [name], MD;  Location: [place] [facility];  Service: Cardiovascular;  Laterality: N/A;  . TEE WITHOUT CARDIOVERSION N/A 10/22/2020   Procedure: TRANSESOPHAGEAL ECHOCARDIOGRAM (TEE);  Surgeon: [name], MD;  Location: [place];  Service: Open Heart Surgery;  Laterality: N/A;  . TRANSCATHETER AORTIC VALVE REPLACEMENT, TRANSFEMORAL Right 10/22/2020   Procedure: TRANSCATHETER AORTIC VALVE REPLACEMENT, TRANSFEMORAL;  Surgeon: [name], MD;  Location: [place];  Service: Open Heart Surgery;  Laterality: Right;  . US ECHOCARDIOGRAPHY  01/06/2012   mod. AOV ca+,mild to mod. AS,mod mostly posterior MAC-mild MR    Assessment & Plan Clinical Impression: Patient is a 87 y.o. year old male with history of atrial fibrillation maintained on Eliquis, CKD stage III, CVA, seizure disorder maintained on Depakote, history of DVT, hyperlipidemia, OSA not on CPAP, ascending thoracic aortic aneurysm followed by Dr. Caffie Pinto, severe aortic stenosis status post TAVR procedure 10/22/2020, nonobstructive CAD. Per chart review lives with spouse. Two-level home bed and bath main level 2 steps to entry. Independent with assistive device. He does have a daughter with good support. Presented 11/19/2020 after increasing weakness with frequent falls and altered mental status. Admission chemistries unremarkable except glucose 123, creatinine 1.34, hemoglobin 12.3, valproic acid  level 26. Cranial CT scan showed large chronic left subdural hematoma. Patient underwent left-sided bur hole x2 placement of subdural drain for evacuation of subdural hematoma 11/21/2020 per Dr. Duffy Rhody. He remains on Depakote as prior to admission Follow-up cranial CT scan 11/22/2020 showed decompressed  subdural hematoma on the left essentially resolved. He was cleared to begin subcutaneous heparin for DVT prophylaxis 11/25/2020. His chronic Eliquis remained on hold for approximately 6 weeks secondary to SDH. Close monitoring of blood pressure initially on Cardene drip which is since been discontinued. On 11/26/2020 patient with increasing right side weakness and aphasia. Neurology consulted with MRI/MRA completed showing unchanged thickness of left convexity subdural hematoma. No acute ischemia. MRA unremarkable. Old infarcts of the right frontal lobe and left caudate head. Chronic small vessel ischemic changes of the white matter. His cranial CT scan was completed for follow-up 11/30/2020 showing decreased conspicuity of left cerebral convexity subdural collection and tiny foci of pneumocephalus. 2-3 mm rightward midline shift. EEG completed showing no seizure activity however suggestive of cortical dysfunction left hemisphere likely secondary to underlying hematoma as well as moderate diffuse encephalopathy. Currently maintained on a dysphagia #1 thin liquid diet.. Therapy evaluations completed due to patient's altered mental status decreased functional mobility he was admitted for a comprehensive rehab program.  Pt presents with decreased activity tolerance, decreased functional mobility, decreased balance, decreased coordination, decreased awareness, decreased problem solving, decreased safety, decreased memory and delayed processing Limiting pt's independence with leisure/community pursuits.  Met with pt and daughter today at bedside to discuss TR services, leisure interests, activity analysis/potential adaptations, and discharge planning.  Pt's hearing aids were charging, so pt communication was limited due to low hearing.  Both pt and daughter describe pt as active PTA, clearing land, using tractor, driving golf cart, splitting wood with a wood splitter.  Pt states plans to return to these  activities after discharge.  Discussed the value he has in these activities and identifying potential substitutes.  Pt with poor safety awareness and will need reenforcement of these modifications.   No further TR at this time  Recommendations for other services: None   Discharge Criteria: Patient will be discharged from TR if patient refuses treatment 3 consecutive times without medical reason.  If treatment goals not met, if there is a change in medical status, if patient makes no progress towards goals or if patient is discharged from hospital.  The above assessment, treatment plan, treatment alternatives and goals were discussed and mutually agreed upon: by patient and family.  Emma 12/11/2020, 2:03 PM

## 2020-12-11 NOTE — Plan of Care (Signed)
  Problem: RH Problem Solving Goal: LTG Patient will demonstrate problem solving for (SLP) Description: LTG:  Patient will demonstrate problem solving for basic/complex daily situations with cues  (SLP) Flowsheets (Taken 12/11/2020 1210) LTG Patient will demonstrate problem solving for: Minimal Assistance - Patient > 75% Note: Downgraded due to slower than anticipated progress and sooner than anticipated d/c (has moved up to 3/16)   Problem: RH Awareness Goal: LTG: Patient will demonstrate awareness during functional activites type of (SLP) Description: LTG: Patient will demonstrate awareness during functional activites type of (SLP) Flowsheets (Taken 12/11/2020 1210) LTG: Patient will demonstrate awareness during cognitive/linguistic activities with assistance of (SLP): Minimal Assistance - Patient > 75% Note: Downgraded due to slower than anticipated progress and sooner than anticipated d/c (has moved up to 3/16)

## 2020-12-12 DIAGNOSIS — S065X0D Traumatic subdural hemorrhage without loss of consciousness, subsequent encounter: Secondary | ICD-10-CM | POA: Diagnosis not present

## 2020-12-12 LAB — VALPROIC ACID LEVEL: Valproic Acid Lvl: 40 ug/mL — ABNORMAL LOW (ref 50.0–100.0)

## 2020-12-12 LAB — GLUCOSE, CAPILLARY: Glucose-Capillary: 88 mg/dL (ref 70–99)

## 2020-12-12 LAB — AMMONIA: Ammonia: 16 umol/L (ref 9–35)

## 2020-12-12 MED ORDER — DIVALPROEX SODIUM ER 500 MG PO TB24
1000.0000 mg | ORAL_TABLET | Freq: Every day | ORAL | Status: DC
  Administered 2020-12-12 – 2020-12-18 (×7): 1000 mg via ORAL
  Filled 2020-12-12 (×7): qty 2

## 2020-12-12 NOTE — Progress Notes (Signed)
PROGRESS NOTE   Subjective/Complaints: Heparin induce platelet antibody level within normal range.  Spoke to neuro about depakote dose, pt with hx of seizure d/o on depakote 500mg  BID as OP, now status post burr hole drainage of SDH   ROS: Denies CP, SOB, N/V/D  Objective:   No results found. Recent Labs    12/10/20 0524  WBC 6.8  HGB 11.7*  HCT 35.7*  PLT 55*   Recent Labs    12/10/20 0524  NA 134*  K 4.5  CL 103  CO2 24  GLUCOSE 101*  BUN 27*  CREATININE 1.49*  CALCIUM 8.5*    Intake/Output Summary (Last 24 hours) at 12/12/2020 0819 Last data filed at 12/11/2020 2200 Gross per 24 hour  Intake 200 ml  Output 200 ml  Net 0 ml        Physical Exam: Vital Signs Blood pressure 132/69, pulse 75, temperature 98 F (36.7 C), resp. rate 14, height 6\' 1"  (1.854 m), weight 82.3 kg, SpO2 100 %.  General: No acute distress Mood and affect are appropriate Heart: Regular rate and rhythm no rubs murmurs or extra sounds Lungs: Clear to auscultation, breathing unlabored, no rales or wheezes Abdomen: Positive bowel sounds, soft nontender to palpation, nondistended Extremities: No clubbing, cyanosis, or edema Skin: No evidence of breakdown, no evidence of rash Neurologic: Cranial nerves II through XII intact, motor strength is 5/5 in bilateral deltoid, bicep, tricep, grip, hip flexor, knee extensors, ankle dorsiflexor and plantar flexor   Musc: No edema in extremities.  No tenderness in extremities. Neurologic: Alert and oriented x3 HOH Motor: 5/5 throughout Assessment/Plan: 1. Functional deficits which require 3+ hours per day of interdisciplinary therapy in a comprehensive inpatient rehab setting.  Physiatrist is providing close team supervision and 24 hour management of active medical problems listed below.  Physiatrist and rehab team continue to assess barriers to discharge/monitor patient progress toward  functional and medical goals  Care Tool:  Bathing    Body parts bathed by patient: Right arm,Left arm,Chest,Abdomen,Right upper leg,Left upper leg,Left lower leg,Face,Front perineal area,Right lower leg,Buttocks   Body parts bathed by helper: Buttocks     Bathing assist Assist Level: Minimal Assistance - Patient > 75%     Upper Body Dressing/Undressing Upper body dressing   What is the patient wearing?: Pull over shirt    Upper body assist Assist Level: Supervision/Verbal cueing    Lower Body Dressing/Undressing Lower body dressing      What is the patient wearing?: Pants,Incontinence brief     Lower body assist Assist for lower body dressing: Moderate Assistance - Patient 50 - 74%     Toileting Toileting    Toileting assist Assist for toileting: Minimal Assistance - Patient > 75%     Transfers Chair/bed transfer  Transfers assist     Chair/bed transfer assist level: Minimal Assistance - Patient > 75% Chair/bed transfer assistive device: Programmer, multimedia   Ambulation assist      Assist level: Moderate Assistance - Patient 50 - 74% Assistive device: No Device Max distance: 10'   Walk 10 feet activity   Assist     Assist level: Contact Guard/Touching assist  Assistive device: Walker-rolling   Walk 50 feet activity   Assist    Assist level: Minimal Assistance - Patient > 75% Assistive device: Walker-rolling    Walk 150 feet activity   Assist Walk 150 feet activity did not occur: Safety/medical concerns         Walk 10 feet on uneven surface  activity   Assist Walk 10 feet on uneven surfaces activity did not occur: Safety/medical concerns         Wheelchair     Assist Will patient use wheelchair at discharge?: No Type of Wheelchair: Manual    Wheelchair assist level: Minimal Assistance - Patient > 75% Max wheelchair distance: 150    Wheelchair 50 feet with 2 turns activity    Assist         Assist Level: Minimal Assistance - Patient > 75%   Wheelchair 150 feet activity     Assist      Assist Level: Minimal Assistance - Patient > 75%   Medical Problem List and Plan: 1.Decreased functional mobility with altered mental statussecondary to chronic, 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 Status post bur hole 11/21/2020  Continue CIR PT, OT, SLP 2. Antithrombotics: -DVT/anticoagulation:Subcutaneous heparin on hold, ask pharm to suggest further management  -antiplatelet therapy: N/A 3. Pain Management:Tylenol as needed 4. Mood:Provide emotional support -antipsychotic agents: N/A 5. Neuropsych: This patientisnot quitecapable of making decisions onhisown behalf. 6. Skin/Wound Care:Routine skin checks - staples out  7. Fluids/Electrolytes/Nutrition:Routine in and outs with follow-up chemistries, BUN mildly increased but fluid intake better yesterday  8. Seizure disorder. Depakote 250 mg every 6 hours  No breakthrough seizures in CIR 9. Atrial fibrillation. Cardiac rate controlled. Eliquis on hold due to SDH x4 to 6 weeks. Reg rhythm today on exam. Vitals:   12/11/20 1451 12/12/20 0407  BP: (!) 118/59 132/69  Pulse: 93 75  Resp: 18 14  Temp: (!) 97.5 F (36.4 C) 98 F (36.7 C)  SpO2: 98% 100%   BP controlled , rate with mild brady  10. CKD stage III  Creatinine 1.31 on 3/1, 3/9 cr 1.4 at baseline 11. Ascending thoracic aortic aneurysm. Follow-up outpatient Dr. Caffie Pinto 12. 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 Follow-up outpatient cardiology services Dr. [name] 13. BPH. Proscar 5 mg daily.  14. History of OSA. Patient not on CPAP. 15. Dysphagia. Dysphagia #3 thins liquids.   Continue to advance diet as tolerated  16.  Hematuria reported by nsg last noc, has UTI pansensitive e coli, also holding heparin due to low PLT  Hemoglobin 12.8 on 3/6, down to  11.7 on 3/8 continue to monitor  Continue Keflex 17.  Thrombocytopenia- Hep induced antiplatelet antibody level is normal   Platelets reached nadir of ~30K, now ~55K, no signs of bleeding , repeat in am   LOS: 10 days A FACE TO [place] E Kirst[place]ins 12/12/2020, 8:19 AM

## 2020-12-12 NOTE — Progress Notes (Addendum)
Neurology progress note  Subjective:  Reports he is feeling well, no acute complaints  Objective: Current vital signs: BP 132/69 (BP Location: Left Arm)   Pulse 75   Temp 98 F (36.7 C)   Resp 14   Ht 6\' 1"  (1.854 m)   Wt 82.3 kg   SpO2 100%   BMI 23.94 kg/m  Vital signs in last 24 hours: Temp:  [97.5 F (36.4 C)-98 F (36.7 C)] 98 F (36.7 C) (03/10 0407) Pulse Rate:  [75-93] 75 (03/10 0407) Resp:  [14-18] 14 (03/10 0407) BP: (118-132)/(59-69) 132/69 (03/10 0407) SpO2:  [98 %-100 %] 100 % (03/10 0407)  General exam  HEENT-  Well healing burr hole area on the left scalp  Cardiovascular- perfusing extremities well, no edema   Lungs-no rhonchi or wheezing noted, no excessive working breathing.   Abdomen-nondistended, nontender Extremities- Warm, dry and intact Musculoskeletal-no joint tenderness, deformity or swelling   Neurologic Exam: Mental Status: Patient is awake, alert, oriented to [facility] and reports he is at "[facility]" and able to follow some simple midline and peripheral verbal commands.  However he does struggle with some commands such as required for confrontational strength testing. Cranial Nerves: Pupils equal, round.  Tracks examiner in all directions.  Mild right nasolabial fold flattening.  Hearing grossly intact to voice, though he does appear to be somewhat hard of hearing at baseline Motor: 5/5 in left extremities, 4/5 in right lower extremity.  Mild pronator drift of the right upper extremity, with none in the left Tone and bulk:normal tone throughout; no atrophy noted Sensory: Able to feel light touch in all extremities equally. Deep Tendon Reflexes: Hypoactive . Cerebellar: Normal finger-to-nose on left upper extremity, unable to perform contralateral extremity. Gait: Deferred  Lab Results: Basic Metabolic Panel: Recent Labs  Lab 12/10/20 0524  NA 134*  K 4.5  CL 103  CO2 24  GLUCOSE  101*  BUN 27*  CREATININE 1.49*  CALCIUM 8.5*   CBC: Recent Labs  Lab 12/06/20 0940 12/08/20 1011 12/10/20 0524  WBC 9.0 6.3 6.8  NEUTROABS  --  4.6 4.5  HGB 13.4 12.8* 11.7*  HCT 41.4 39.9 35.7*  MCV 99.8 99.8 97.8  PLT 33* 54* 55*   Lab Results  Component Value Date   ALT 17 12/03/2020   AST 23 12/03/2020   ALKPHOS 50 12/03/2020   BILITOT 1.0 12/03/2020   Addendum:  Valproic Acid still low at 40  Ammonia 16   Imaging:  CT Head 2/26 Improving MLS (2-3 mm), expected evolution of SDH   CT Head wo contrast 11/25/2020 IMPRESSION: 1. Status post drainage of left subdural hematoma. A stable amount of hemorrhage and fluid remains in the left subdural area compared to prior exam. Stable minimal left-to-right midline shift of 4 mm is noted. 2. No new hemorrhage is noted. Diffuse cortical atrophy is noted. Mild chronic ischemic white matter disease is noted.  MR Brain wo contrast MRA head wo contrast 11/26/2020 IMPRESSION: 1. Unchanged thickness of left convexity subdural hematoma. No acute ischemia. 2. Normal intracranial MRA. 3. Old infarcts of the right frontal lobe and left caudate head. 4. Chronic small vessel ischemic changes of the white matter.  LT EEG 11/27/2020 IMPRESSION: This study issuggestive of cortical dysfunctioninleft hemisphere likely secondary to underlying hematoma as well as moderate diffuse encephalopathy, nonspecific etiology. No seizures ordefiniteepileptiform discharges were seen throughout the recording.  Assessment:  87 year old man with past medical history significant for cerebrovascular risk factors with recent evacuation  of chronic subdural hematoma and off of anticoagulation due to the hematoma, who presented with worsening right-sided weakness and aphasia.  Given his subtherapeutic Depakote level on admission, his Depakote dose was doubled.  It has been sometime since the level was checked, so will confirm that he is at an  appropriate level at this time and will also check an ammonia.  He has a recent liver function panel from 3/1 that is reassuring.  We will also plan to consolidate Depakote to extended release formulation to simplify dosing regimen for eventual discharge.  Daughter expressed significant concern that patient will want to continue using chainsaws, driving his tractor, and using other heavy/dangerous equipment.   Given the fact that he had worsening of his clinical status despite head CT being stable, I am concerned he had some breakthrough seizures especially given Depakote was at a subtherapeutic level.  EEG obtained on increased Depakote was reassuring.  Therefore, discharge paperwork should include the seizure precautions as noted below, daughter has requested that it would also be helpful if these are reinforced with the patient more more as he approaches discharge by the primary team.  Appreciate PT/OT assistance in helping define what activities will be safe for him on discharge as well.  Impression: -Left-sided subdural hematoma s/p drainage -H/O atrial fibrillation, of off anticoagulation due to subdural hematoma.   Recommendation: -Check trough Depakote level today prior to noon dose -- given stable level (just slightly subtx, but patients symptoms are controlled and family reports he is a bit sedated at current dose, so will not change dose)  -Consolidate depakote to 1000 mg nightly  (ordered) -Ammonia level normal as above -Appreciate assistance of primary team and reinforcing seizure precaution recommendations with patient regularly prior to discharge -Neurology will be available on an as-needed basis going forward, please reconsult [place] should new questions arise  Standard seizure precautions:  Per [facility] statutes, patients with seizures are not allowed to drive until  they have been seizure-free for six months. Use caution when using heavy equipment or power tools. Avoid  working on ladders or at heights. Take showers instead of baths. Ensure the water temperature is not too high on the home water heater. Do not go swimming alone. When caring for infants or small children, sit down when holding, feeding, or changing them to minimize risk of injury to the child in the event you have a seizure.  To reduce risk of seizures, maintain good sleep hygiene avoid alcohol and illicit drug use, take all anti-seizure medications as prescribed.    12/12/2020, 7:37 AM   Lesleigh Noe MD-PhD [facility] [phone] If 7pm to 7am, please call on call as listed on AMION.  I spent 38 minutes in direct care of this patient today, over 50% at bedside

## 2020-12-12 NOTE — Progress Notes (Signed)
Speech Language Pathology Daily Session Note  Patient Details  Name: LEAF KERNODLE MRN: [id number] Date of Birth: 1929/07/04  Today's Date: 12/12/2020 SLP Individual Time: 5974-1638 SLP Individual Time Calculation (min): 42 min  Short Term Goals: Week 2: SLP Short Term Goal 1 (Week 2): Pt will consume trials of regular texture solids demonstrating efficient mastication, oral clearance, and safe rate of intake with Min cues from clinician prior to advancement. SLP Short Term Goal 2 (Week 2): Pt will demonstrate awareness of functional errors with Min A multimodal cues. SLP Short Term Goal 3 (Week 2): Pt will demonstrate ability to problem solve basic familiar tasks with Min A verbal and visual cues. SLP Short Term Goal 4 (Week 2): Pt will recall new and/or daily information with Supervision A verbal cues for use of comepnsatory strategies and/or aids.  Skilled Therapeutic Interventions: Pt was seen for skilled ST targeting swallowing and cognitive goals. SLP facilitated session with an advanced trial of regular texture solids with thin liquids. Pt demonstrated efficient mastication and oral clearance of regular textures in the absence of overt s/sx aspiration. He used slower rate of intake for safe swallowing with Min A. Trace posterior lingual residue of peanut butter cleared with liquid wash X1. Recommend pt upgrade to regular textures, continue thin liquids, and pt may reduce to intermittent supervision with meals. Pt recalled his new d/c date and calculated days until d/c from today's date with Supervision A visual cues with use of calendar. Pt also completed a basic money management task using cash and coins with Min A verbal and visual cues for problem solving, Mod A for error awareness. This is improved since last targeted. Pt continues to make steady progress toward all cognitive goals. Pt left laying in bed with alarm set and needs within reach. Continue per current plan of care.         Pain Pain Assessment Pain Scale: 0-10 Pain Score: 0-No pain  Therapy/Group: Individual Therapy  Arbutus Leas 12/12/2020, 7:25 AM

## 2020-12-12 NOTE — Progress Notes (Signed)
Occupational Therapy Session Note  Patient Details  Name: Cesar [name] MRN: [id number] Date of Birth: [birth year]  Today's Date: 12/12/2020 OT Individual Time: 1105-1201 OT Individual Time Calculation (min): 56 min    Short Term Goals: Week 2:  OT Short Term Goal 1 (Week 2): Pt will complete LB dressing with min assist sit to stand for two consecutive sessions. OT Short Term Goal 2 (Week 2): Pt will sequence through showering task with no more than min instructional cueing for thoroughness. OT Short Term Goal 3 (Week 2): Pt will complete LB dressing sit to stand with min guard assist for two consecutive sessions.  Skilled Therapeutic Interventions/Progress Updates:     He was agreeable to completion of showering task with mod coaxing from therapist and his daughter.  Supervision with increased time for transfer to the EOB with min assist for functional mobility to the bathroom with use of the RW.  He was able to remove clothing for shower with min assist and mod instructional cueing.  Shower was completed at min assist level with mod instructional cueing as well for thoroughness.  Soap was added to the washcloth as well as cueing for washing his hair and also washing his buttocks.  He transferred to the wheelchair at min assist using the grab bar for support, but did not square up with the surface when attempting to sit.  Therapist made him reposition in the chair as he slid into it from the side.  He completed dressing at the sink with mod assist for brief secondary to putting it on backwards to start as well as not being able to place it over his feet efficiently.  He was able to donn his jeans with min assist sit to stand however, including fastening them in standing.  Pullover shirt was donned with supervision while the zip up hoodie was donned with min assist.  He was able to donn his velcro shoes with setup after therapist assisted with donning TEDs.  Finished session with completion of  functional mobility to the nurses station with use of the RW with min assist and min instructional cueing for upright posture.  He rested and then ambulated back at the same level with his daughter helping with wheelchair follow.  Call button and phone in reach with safety belt in place.    Therapy Documentation Precautions:  Precautions Precautions: Fall Precaution Comments: HOH, expressive difficulty Restrictions Weight Bearing Restrictions: No Other Position/Activity Restrictions: HOH, does have hearing aides  Pain: Pain Assessment Pain Scale: 0-10 Pain Score: 0-No pain ADL: See Care Tool Section for some details of mobility and selfcare  Therapy/Group: Individual Therapy  Russie Gulledge OTR/L 12/12/2020, 12:45 PM

## 2020-12-12 NOTE — Progress Notes (Signed)
Patient ID: Cesar [name], male   DOB: 1928/10/12, 87 y.o.   MRN: [id number] Met with the patient and daughter to review role of the nurse CM and address educational needs. Discussed collaboration with the SW to facilitate preparation for discharge and smooth transition to home. Reviewed discharged process and private duty caregiver list for hired help at discharge. Reviewed Heparin on hold and rationale along with depakote/dosage for discharge per MD. Continue to follow along to discharge and address questions/educational needs. Confirmed referral to Dr. Sima Matas for coping and change in lifestyle, etc. Margarito Liner

## 2020-12-12 NOTE — Progress Notes (Signed)
Physical Therapy Session Note  Patient Details  Name: Cesar [name] MRN: [id number] Date of Birth: Jul 01, 1929  Today's Date: 12/12/2020 PT Individual Time: 8280-0349 PT Individual Time Calculation (min): 55 min   Short Term Goals: Week 2:  PT Short Term Goal 1 (Week 2): Patient will ambulate 71ft with LRAD and MinA PT Short Term Goal 2 (Week 2): Patient will negotiate 2 steps with 1 HR and CGA PT Short Term Goal 3 (Week 2): Patient will transfer bed <> wc with CGA and LRAD  Skilled Therapeutic Interventions/Progress Updates:    Patient received sitting up in wc, agreeable to PT. He denies pain. PT transporting patient to therapy gym in wc for time management and energy conservation. He ambulated 63ft with RW and CGA. He maintains crouched posture with increased B hip/knee flexion and decreased B foot clearance. Decreased step length, L >R. Patient receptive to multimodal cues to correct gait deviations, but only for up to 5 steps at a time before resuming listed gait deviations. PT assessing B hamstring length- WFL B, so decreased hamstring length likely not contributing to crouched gait demonstrated. In the parallel bars, patient completing anterior steps to target with B UE support and CGA encouraging complete and larger steps, 3x12 B. Minisquats in parallel bars with B UE support 3x14 with increased cuing needed ot engage posterior chain to come to fully erect posture. Patient completing Kinetron at 30 cm/s for 4x3' with rest breaks between. Patient able to propel himself in wc x100 ft with CGA/MinA. Once back in the room, patient transferring to bed via stand pivot with CGA. He was able to use urinal and change pants with MinA. He remained in bed at end of session, bed alarm on, call light within reach.   Therapy Documentation Precautions:  Precautions Precautions: Fall Precaution Comments: HOH, expressive difficulty Restrictions Weight Bearing Restrictions: No Other Position/Activity  Restrictions: HOH, does have hearing aides    Therapy/Group: Individual Therapy  Karoline Caldwell, PT, DPT, CBIS  12/12/2020, 7:48 AM

## 2020-12-13 DIAGNOSIS — S065X0D Traumatic subdural hemorrhage without loss of consciousness, subsequent encounter: Secondary | ICD-10-CM | POA: Diagnosis not present

## 2020-12-13 NOTE — Progress Notes (Signed)
Occupational Therapy Session Note  Patient Details  Name: HAMILTON MARINELLO MRN: [id number] Date of Birth: 1928-11-15  Today's Date: 12/13/2020 OT Individual Time: 3500-9381 OT Individual Time Calculation (min): 53 min    Short Term Goals: Week 2:  OT Short Term Goal 1 (Week 2): Pt will complete LB dressing with min assist sit to stand for two consecutive sessions. OT Short Term Goal 2 (Week 2): Pt will sequence through showering task with no more than min instructional cueing for thoroughness. OT Short Term Goal 3 (Week 2): Pt will complete LB dressing sit to stand with min guard assist for two consecutive sessions.  Skilled Therapeutic Interventions/Progress Updates:    Pt up in wheelchair with his wife in the room as well.  Therapist rolled him down to the therapy gym for session with min assist for transfer to the therapy mat with the RW.  Mod instructional cueing for hand placement to reach back to the surface when sitting down.  Had him work on [employer] coordination with use of the [employer] test.  He was able to complete this with the left hand in 53 seconds with the right hand in 1 minute and 52 seconds.  On second trial with the RUE he completed it in 65 seconds.  Frequent drops noted with each trial.  Next, had him work on Actor task in sitting with the RUE, creating a design shown to him in a picture.  He was able to complete 10 peg design with min instructional cueing as he missed the pegs on the left side of the foam block.  On the next trail, he needed mod instructional cueing to complete a 20 piece design with the RUE.  Finished session with transfer back to the room with min guard assist for stand pivot transfer back to the bed.  Call button and phone in reach with safety alarm in place.      Therapy Documentation Precautions:  Precautions Precautions: Fall Precaution Comments: HOH, expressive difficulty Restrictions Weight Bearing Restrictions: No Other Position/Activity  Restrictions: HOH, does have hearing aides  Pain: Pain Assessment Pain Scale: Faces Pain Score: 0-No pain Faces Pain Scale: No hurt ADL: See Care Tool Section for some details of mobility and selfcare  Therapy/Group: Individual Therapy  Avner Stroder OTR/L 12/13/2020, 12:21 PM

## 2020-12-13 NOTE — Progress Notes (Signed)
Patient ID: Cesar [name], male   DOB: [birth year], 87 y.o.   MRN: [id number]   Patient WC, RW, BSC, and TTB ordered through [employer].  [place], [place]

## 2020-12-13 NOTE — Progress Notes (Signed)
Speech Language Pathology Daily Session Note  Patient Details  Name: Cesar [name] MRN: [id number] Date of Birth: 09/23/1929  Today's Date: 12/13/2020 SLP Individual Time: 0800-0830 SLP Individual Time Calculation (min): 30 min  Short Term Goals: Week 2: SLP Short Term Goal 1 (Week 2): Pt will consume trials of regular texture solids demonstrating efficient mastication, oral clearance, and safe rate of intake with Min cues from clinician prior to advancement. SLP Short Term Goal 2 (Week 2): Pt will demonstrate awareness of functional errors with Min A multimodal cues. SLP Short Term Goal 3 (Week 2): Pt will demonstrate ability to problem solve basic familiar tasks with Min A verbal and visual cues. SLP Short Term Goal 4 (Week 2): Pt will recall new and/or daily information with Supervision A verbal cues for use of comepnsatory strategies and/or aids.  Skilled Therapeutic Interventions: Skilled SLP intervention focused on cognition and dysphagia. Pt seen with regular consistency snack. He demonstrated appropriate rate of po intake. Min verbal cues required for liquid wash to clear residue in right cheek. No overt s/sx of aspiration or penetration noted. Min cues required to identify today's date using calendar. He was able to count days left until discharge home with no additional cues required. He was able to understand rehab schedule for today using printed schedule and was able to answer problem solving questions related to times with min to moderate visual cues to information on paper. Pt expressed frustration with OT and stated "My OT is way too hard on me". He required education on purpose of OT and reasoning with tasks completed in OT session. Pt left semi upright in bed with call bell within reach. Cont therapy per plan of care.      Pain Pain Assessment Pain Scale: Faces Faces Pain Scale: No hurt  Therapy/Group: Individual Therapy  Gregary Signs A Zykiria Bruening 12/13/2020, 8:25 AM

## 2020-12-13 NOTE — Progress Notes (Addendum)
Physical Therapy Session Note  Patient Details  Name: Cesar [name] MRN: [id number] Date of Birth: 08/06/1929  Today's Date: 12/13/2020 PT Individual Time: 3267-1245; 1104-1200 PT Individual Time Calculation (min): 26 min and 56 mins  Short Term Goals: Week 2:  PT Short Term Goal 1 (Week 2): Patient will ambulate 52ft with LRAD and MinA PT Short Term Goal 2 (Week 2): Patient will negotiate 2 steps with 1 HR and CGA PT Short Term Goal 3 (Week 2): Patient will transfer bed <> wc with CGA and LRAD  Skilled Therapeutic Interventions/Progress Updates:    Session 1: Patient received reclined in bed, agreeable to PT. He denies pain. He was able to come sit edge of bed with supervision and doff dirty t-shirt with MinA and don clean one with MinA. Slight posterior and L lateral lean noted during this dynamic task. PT donning socks and shoes with MaxA. He transferred to wc via stand pivot with MinA and was able to complete morning ADLs seated at sink with set up assist + supervision. Patient propelling himself in wc x193ft to therapy gym with B UE and supervision. He completed x5 mins on Kinetron at 20cm/s for improved B LE coordination/strength with emphasis on full AROM possible. Patient returning to room requesting to use bathroom. He was able to ambulate ~78ft to bathroom with RW and CGA. Supervision for clothing management in standing. Patient found to have been incontinent of bowel and bladder in brief. Hand off to NT at end of session to continue care.   Session 2: Patient received reclined in bed, agreeable to PT. He denies pain. He was able to come sit edge of bed with supervision and don shoes with MaxA. He transferred to wc via stand pivot with MinA. PT transporting patient in wc to therapy gym for time management and energy conservation. He was able to ambulate 74ft x2 with RW and CGA with seated rest break between. He requires verbal cues to minimize flexed posture and increase B step length and  foot clearance. He was more responsive to these cues today than in previous sessions, but with fatigue would assume a more crouched posture, though still CGA provided. Patient completing standing dynamic balance task reaching outside BOS with U UE support and 1.5# weights to wrists. He requires close supervision to maintain balance and demonstrated no LOB with anterior reach. Standing task reaching high and outside BOS to encourage full extension of B knees, U UE support on RW. He required CGA to maintain balance. He has limited endurance for standing tasks. Patient able to propel himself in wc to his room with supervision using B UE. Patient remaining up in wc, seatbelt alarm on, call light within reach.   Therapy Documentation Precautions:  Precautions Precautions: Fall Precaution Comments: HOH, expressive difficulty Restrictions Weight Bearing Restrictions: No Other Position/Activity Restrictions: HOH, does have hearing aides    Therapy/Group: Individual Therapy  Karoline Caldwell, PT, DPT, CBIS  12/13/2020, 7:31 AM

## 2020-12-13 NOTE — Plan of Care (Signed)
  Problem: RH Wheelchair Mobility Goal: LTG Patient will propel w/c in controlled environment (PT) Description: LTG: Patient will propel wheelchair in controlled environment, # of feet with assist (PT) Flowsheets (Taken 12/13/2020 1225) LTG: Pt will propel w/c in controlled environ  assist needed:: Supervision/Verbal cueing LTG: Propel w/c distance in controlled environment: 150 Note: Goal added to allow option for safe mobility at home Goal: LTG Patient will propel w/c in home environment (PT) Description: LTG: Patient will propel wheelchair in home environment, # of feet with assistance (PT). Flowsheets (Taken 12/13/2020 1225) LTG: Pt will propel w/c in home environ  assist needed:: Supervision/Verbal cueing Distance: wheelchair distance in controlled environment: 150 Note: Goal added to allow option for safe mobility at home

## 2020-12-13 NOTE — Progress Notes (Signed)
PROGRESS NOTE   Subjective/Complaints: Appreciate neuro note  ROS: Denies CP, SOB, N/V/D  Objective:   No results found. No results for input(s): WBC, HGB, HCT, PLT in the last 72 hours. No results for input(s): NA, K, CL, CO2, GLUCOSE, BUN, CREATININE, CALCIUM in the last 72 hours.  Intake/Output Summary (Last 24 hours) at 12/13/2020 0834 Last data filed at 12/13/2020 0400 Gross per 24 hour  Intake 170 ml  Output 620 ml  Net -450 ml        Physical Exam: Vital Signs Blood pressure (!) 158/71, pulse 63, temperature 98.1 F (36.7 C), resp. rate 17, height 6\' 1"  (1.854 m), weight 82.3 kg, SpO2 94 %.   General: No acute distress Mood and affect are appropriate Heart: Regular rate and rhythm no rubs murmurs or extra sounds Lungs: Clear to auscultation, breathing unlabored, no rales or wheezes Abdomen: Positive bowel sounds, soft nontender to palpation, nondistended Extremities: No clubbing, cyanosis, or edema Skin: No evidence of breakdown, no evidence of rash Neurologic: Cranial nerves II through XII intact, motor strength is 5/5 in bilateral deltoid, bicep, tricep, grip, hip flexor, knee extensors, ankle dorsiflexor and plantar flexor Pt alert , very HOH Musc: No edema in extremities.  No tenderness in extremities. t Assessment/Plan: 1. Functional deficits which require 3+ hours per day of interdisciplinary therapy in a comprehensive inpatient rehab setting.  Physiatrist is providing close team supervision and 24 hour management of active medical problems listed below.  Physiatrist and rehab team continue to assess barriers to discharge/monitor patient progress toward functional and medical goals  Care Tool:  Bathing    Body parts bathed by patient: Right arm,Left arm,Chest,Abdomen,Right upper leg,Left upper leg,Left lower leg,Face,Front perineal area,Right lower leg,Buttocks   Body parts bathed by helper:  Buttocks     Bathing assist Assist Level: Minimal Assistance - Patient > 75%     Upper Body Dressing/Undressing Upper body dressing   What is the patient wearing?: Pull over shirt (zip up hoodie)    Upper body assist Assist Level: Minimal Assistance - Patient > 75%    Lower Body Dressing/Undressing Lower body dressing      What is the patient wearing?: Pants,Incontinence brief     Lower body assist Assist for lower body dressing: Moderate Assistance - Patient 50 - 74%     Toileting Toileting    Toileting assist Assist for toileting: Minimal Assistance - Patient > 75%     Transfers Chair/bed transfer  Transfers assist     Chair/bed transfer assist level: Contact Guard/Touching assist Chair/bed transfer assistive device: Programmer, multimedia   Ambulation assist      Assist level: Minimal Assistance - Patient > 75% Assistive device: Walker-rolling Max distance: 60   Walk 10 feet activity   Assist     Assist level: Contact Guard/Touching assist Assistive device: Walker-rolling   Walk 50 feet activity   Assist    Assist level: Minimal Assistance - Patient > 75% Assistive device: Walker-rolling    Walk 150 feet activity   Assist Walk 150 feet activity did not occur: Safety/medical concerns         Walk 10 feet on uneven  surface  activity   Assist Walk 10 feet on uneven surfaces activity did not occur: Safety/medical concerns         Wheelchair     Assist Will patient use wheelchair at discharge?: No Type of Wheelchair: Manual    Wheelchair assist level: Contact Guard/Touching assist Max wheelchair distance: 100    Wheelchair 50 feet with 2 turns activity    Assist        Assist Level: Contact Guard/Touching assist   Wheelchair 150 feet activity     Assist      Assist Level: Minimal Assistance - Patient > 75%   Medical Problem List and Plan: 1.Decreased functional mobility with altered  mental statussecondary to chronic, 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 Status post bur hole 11/21/2020  Continue CIR PT, OT, SLP  We discussed taking it easy after he returns home, no driving, cars or tractors (state law post seizure) also no power tool use .  Still a high fall risk 2. Antithrombotics: -DVT/anticoagulation:Subcutaneous heparin on hold,anti plt ab neg, plt count still low will not resume-antiplatelet therapy: N/A 3. Pain Management:Tylenol as needed 4. Mood:Provide emotional support -antipsychotic agents: N/A 5. Neuropsych: This patientisnot quitecapable of making decisions onhisown behalf. 6. Skin/Wound Care:Routine skin checks - staples out  7. Fluids/Electrolytes/Nutrition:Routine in and outs with follow-up chemistries, BUN mildly increased but fluid intake better yesterday  8. Seizure disorder. Depakote 250 mg every 6 hours  No breakthrough seizures in CIR 9. Atrial fibrillation. Cardiac rate controlled. Eliquis on hold due to SDH x4 to 6 weeks. Reg rhythm today on exam. Vitals:   12/12/20 1912 12/13/20 0404  BP: 128/67 (!) 158/71  Pulse: 65 63  Resp: 16 17  Temp: 98.8 F (37.1 C) 98.1 F (36.7 C)  SpO2: 98% 94%   BP controlled , rate with mild brady  10. CKD stage III  Creatinine 1.31 on 3/1, 3/9 cr 1.4 at baseline 11. Ascending thoracic aortic aneurysm. Follow-up outpatient Dr. Caffie Pinto 12. 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 Follow-up outpatient cardiology services Dr. [name] 13. BPH. Proscar 5 mg daily.  14. History of OSA. Patient not on CPAP. 15. Dysphagia. Dysphagia #3 thins liquids.   Continue to advance diet as tolerated  16.  Hematuria reported by nsg last noc, has UTI pansensitive e coli, also holding heparin due to low PLT  Hemoglobin 12.8 on 3/6, down to 11.7 on 3/8 continue to monitor  Continue Keflex 17.  Thrombocytopenia- Hep  induced antiplatelet antibody level is normal   Platelets reached nadir of ~30K, now ~55K, no signs of bleeding , repeat Monday   LOS: 11 days A FACE TO Cesar [name] 12/13/2020, 8:34 AM

## 2020-12-14 NOTE — Plan of Care (Signed)
  Problem: Consults Goal: RH BRAIN INJURY PATIENT EDUCATION Description: Description: See Patient Education module for eduction specifics Outcome: Progressing   Problem: RH SKIN INTEGRITY Goal: RH STG SKIN FREE OF INFECTION/BREAKDOWN Description: Mod I  Outcome: Progressing   Problem: RH SAFETY Goal: RH STG ADHERE TO SAFETY PRECAUTIONS W/ASSISTANCE/DEVICE Description: STG Adhere to Safety Precautions With Mod I Assistance/Device. Outcome: Progressing Goal: RH STG DECREASED RISK OF FALL WITH ASSISTANCE Description: STG Decreased Risk of Fall With Mod I Assistance. Outcome: Progressing   Problem: RH COGNITION-NURSING Goal: RH STG USES MEMORY AIDS/STRATEGIES W/ASSIST TO PROBLEM SOLVE Description: STG Uses Memory Aids/Strategies With min Assistance to Problem Solve. Outcome: Progressing Goal: RH STG ANTICIPATES NEEDS/CALLS FOR ASSIST W/ASSIST/CUES Description: STG Anticipates Needs/Calls for min Assist With Assistance/Cues. Outcome: Progressing

## 2020-12-15 DIAGNOSIS — S065X0D Traumatic subdural hemorrhage without loss of consciousness, subsequent encounter: Secondary | ICD-10-CM | POA: Diagnosis not present

## 2020-12-15 DIAGNOSIS — N390 Urinary tract infection, site not specified: Secondary | ICD-10-CM | POA: Diagnosis not present

## 2020-12-15 DIAGNOSIS — D696 Thrombocytopenia, unspecified: Secondary | ICD-10-CM | POA: Diagnosis not present

## 2020-12-15 DIAGNOSIS — R319 Hematuria, unspecified: Secondary | ICD-10-CM | POA: Diagnosis not present

## 2020-12-15 DIAGNOSIS — I4891 Unspecified atrial fibrillation: Secondary | ICD-10-CM

## 2020-12-15 NOTE — Progress Notes (Signed)
Occupational Therapy Session Note  Patient Details  Name: Cesar [name] MRN: [id number] Date of Birth: March 08, 1929  Today's Date: 12/15/2020 OT Individual Time: 1000-1055 OT Individual Time Calculation (min): 55 min    Short Term Goals: Week 2:  OT Short Term Goal 1 (Week 2): Pt will complete LB dressing with min assist sit to stand for two consecutive sessions. OT Short Term Goal 2 (Week 2): Pt will sequence through showering task with no more than min instructional cueing for thoroughness. OT Short Term Goal 3 (Week 2): Pt will complete LB dressing sit to stand with min guard assist for two consecutive sessions.  Skilled Therapeutic Interventions/Progress Updates:    Pt resting in bed upon arrival and requesting to use bathroom. Supine>sit EOB and stand pivot transfer to w/c with CGA. CGA for stand pivot transfer to toilet using grab bars. Mod A for toileting. Pt returned to room and completed bathing/dressing tasks with sit<>stand from w/c at sink. See Care Tool for assist level. Pt threaded Depends and pants appropriately without assistance. Pt able to fasten blue jeans without assistance. Pt transitioned to day room and completed moderately complex pipe tree structure with min verbal cues. Pt uses BUE appropriately during activity. Pt returned to room and remained in w/c. All needs within reach and seat alarm activated.   Therapy Documentation Precautions:  Precautions Precautions: Fall Precaution Comments: HOH, expressive difficulty Restrictions Weight Bearing Restrictions: No Other Position/Activity Restrictions: HOH, does have hearing aides   Pain:  Pt denies pain this morning   Therapy/Group: Individual Therapy  Leroy Libman 12/15/2020, 11:05 AM

## 2020-12-15 NOTE — Progress Notes (Signed)
Speech Language Pathology Daily Session Note  Patient Details  Name: MATTI MINNEY MRN: [id number] Date of Birth: 1928-12-05  Today's Date: 12/15/2020 SLP Individual Time: 2440-1027  SLP Individual Time Calculation (min): 43 min  Short Term Goals: Week 2: SLP Short Term Goal 1 (Week 2): Pt will consume trials of regular texture solids demonstrating efficient mastication, oral clearance, and safe rate of intake with Min cues from clinician prior to advancement. SLP Short Term Goal 2 (Week 2): Pt will demonstrate awareness of functional errors with Min A multimodal cues. SLP Short Term Goal 3 (Week 2): Pt will demonstrate ability to problem solve basic familiar tasks with Min A verbal and visual cues. SLP Short Term Goal 4 (Week 2): Pt will recall new and/or daily information with Supervision A verbal cues for use of comepnsatory strategies and/or aids.  Skilled Therapeutic Interventions: Skilled SLP intervention focused on cognition. Pt very hard of hearing and needed information repeated multiple times. Pt required moderate cues for awareness of physical limitations following dc home. Increased awareness noted with his own errors when sorting cards into ascending order. Min to no verbal cueing required to orient to date, and days left until DC when given calendar. Pt requested to get back into bed at end of session. He transferred to bed with Min A and followed commands adequately to increase safety during transfer. Cont with therapy per plan of care. Pt left lying in bed with bed alarm set and call button within reach.  Cont with therapy per plan of care.      Pain Pain Assessment Pain Scale: Faces Faces Pain Scale: No hurt  Therapy/Group: Individual Therapy  Darrol Poke Sherie Dobrowolski 12/15/2020, 1:35 PM

## 2020-12-15 NOTE — Progress Notes (Signed)
PROGRESS NOTE   Subjective/Complaints: Patient seen sitting up in bed this morning.  He states he slept well overnight.  He denies complaints.  He states he is having regular bowel movements.  ROS: Denies CP, SOB, N/V/D  Objective:   No results found. No results for input(s): WBC, HGB, HCT, PLT in the last 72 hours. No results for input(s): NA, K, CL, CO2, GLUCOSE, BUN, CREATININE, CALCIUM in the last 72 hours.  Intake/Output Summary (Last 24 hours) at 12/15/2020 0909 Last data filed at 12/15/2020 0739 Gross per 24 hour  Intake 848 ml  Output 525 ml  Net 323 ml        Physical Exam: Vital Signs Blood pressure 121/62, pulse 73, temperature 98 F (36.7 C), temperature source Oral, resp. rate 18, height 6\' 1"  (1.854 m), weight 82.3 kg, SpO2 98 %. Constitutional: No distress . Vital signs reviewed. HENT: Normocephalic.  Atraumatic. Eyes: EOMI. No discharge. Cardiovascular: No JVD.  Irregularly irregular. Respiratory: Normal effort.  No stridor.  Bilateral clear to auscultation. GI: Non-distended.  BS +. Skin: Warm and dry.  Intact. Psych: Normal mood.  Normal behavior. Musc: No edema in extremities.  No tenderness in extremities. Neuro: Alert Motor: 5/5 throughout Very HOH  Assessment/Plan: 1. Functional deficits which require 3+ hours per day of interdisciplinary therapy in a comprehensive inpatient rehab setting.  Physiatrist is providing close team supervision and 24 hour management of active medical problems listed below.  Physiatrist and rehab team continue to assess barriers to discharge/monitor patient progress toward functional and medical goals  Care Tool:  Bathing    Body parts bathed by patient: Right arm,Left arm,Chest,Abdomen,Right upper leg,Left upper leg,Left lower leg,Face,Front perineal area,Right lower leg,Buttocks   Body parts bathed by helper: Buttocks     Bathing assist Assist Level:  Minimal Assistance - Patient > 75%     Upper Body Dressing/Undressing Upper body dressing   What is the patient wearing?: Pull over shirt (zip up hoodie)    Upper body assist Assist Level: Minimal Assistance - Patient > 75%    Lower Body Dressing/Undressing Lower body dressing      What is the patient wearing?: Pants,Incontinence brief     Lower body assist Assist for lower body dressing: Moderate Assistance - Patient 50 - 74%     Toileting Toileting    Toileting assist Assist for toileting: Minimal Assistance - Patient > 75%     Transfers Chair/bed transfer  Transfers assist     Chair/bed transfer assist level: Minimal Assistance - Patient > 75% Chair/bed transfer assistive device: Programmer, multimedia   Ambulation assist      Assist level: Contact Guard/Touching assist Assistive device: Walker-rolling Max distance: 90   Walk 10 feet activity   Assist     Assist level: Contact Guard/Touching assist Assistive device: Walker-rolling   Walk 50 feet activity   Assist    Assist level: Contact Guard/Touching assist Assistive device: Walker-rolling    Walk 150 feet activity   Assist Walk 150 feet activity did not occur: Safety/medical concerns         Walk 10 feet on uneven surface  activity   Assist Walk  10 feet on uneven surfaces activity did not occur: Safety/medical concerns         Wheelchair     Assist Will patient use wheelchair at discharge?: Yes Type of Wheelchair: Manual    Wheelchair assist level: Supervision/Verbal cueing Max wheelchair distance: 150    Wheelchair 50 feet with 2 turns activity    Assist        Assist Level: Supervision/Verbal cueing   Wheelchair 150 feet activity     Assist      Assist Level: Supervision/Verbal cueing   Medical Problem List and Plan: 1.Decreased functional mobility with altered mental statussecondary to chronic, traumatic left frontal SDH  associated with multiple falls. Status post bur hole 11/21/2020  Continue CIR  Previously discussed no driving, cars or tractors (state law post seizure) also no power tool use .  Still a high fall risk 2. Antithrombotics:  -DVT/anticoagulation:Subcutaneous heparin on hold,anti plt ab neg, plt count still low will not resume  -antiplatelet therapy: N/A 3. Pain Management:Tylenol as needed 4. Mood:Provide emotional support -antipsychotic agents: N/A 5. Neuropsych: This patientisnot quitecapable of making decisions onhisown behalf. 6. Skin/Wound Care:Routine skin checks -staples out  7. Fluids/Electrolytes/Nutrition:Routine in and outs 8. Seizure disorder. Depakote 250 mg every 6 hours, level low on 3/10  No breakthrough seizures in CIR- 3/13 9. Atrial fibrillation. Cardiac rate controlled. Eliquis on hold due to SDH x4 to 6 weeks. Reg rhythm today on exam. Vitals:   12/14/20 1921 12/15/20 0349  BP: 126/64 121/62  Pulse: 62 73  Resp: 16 18  Temp: 98.4 F (36.9 C) 98 F (36.7 C)  SpO2: 95% 98%   BP controlled on 3/13 10. CKD stage III  Creatinine 1.49 on 3/8, labs ordered for tomorrow 11. Ascending thoracic aortic aneurysm. Follow-up outpatient Dr. Caffie Pinto 12. 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 Follow-up outpatient cardiology services Dr. [name] 13. BPH. Proscar 5 mg daily.  14. History of OSA. Patient not on CPAP. 15. Dysphagia. Dysphagia #3 thins liquids.   Continue to advance diet as tolerated  16.  Hematuria reported by nsg, has UTI pansensitive e coli, also holding heparin due to low PLT  Hemoglobin 11.7 on 3/8, labs ordered for tomorrow  Continue Keflex through 3/13 17.  Thrombocytopenia- Hep induced antiplatelet antibody level is normal   Platelets 55 on 3/8, labs ordered for tomorrow  LOS: 13 days A FACE TO FACE EVALUATION WAS PERFORMED  Cesar [name] 12/15/2020, 9:09 AM

## 2020-12-16 DIAGNOSIS — S065X0D Traumatic subdural hemorrhage without loss of consciousness, subsequent encounter: Secondary | ICD-10-CM | POA: Diagnosis not present

## 2020-12-16 LAB — CBC
HCT: 35.8 % — ABNORMAL LOW (ref 39.0–52.0)
Hemoglobin: 11.7 g/dL — ABNORMAL LOW (ref 13.0–17.0)
MCH: 32.3 pg (ref 26.0–34.0)
MCHC: 32.7 g/dL (ref 30.0–36.0)
MCV: 98.9 fL (ref 80.0–100.0)
Platelets: 133 10*3/uL — ABNORMAL LOW (ref 150–400)
RBC: 3.62 MIL/uL — ABNORMAL LOW (ref 4.22–5.81)
RDW: 13.3 % (ref 11.5–15.5)
WBC: 7 10*3/uL (ref 4.0–10.5)
nRBC: 0 % (ref 0.0–0.2)

## 2020-12-16 LAB — BASIC METABOLIC PANEL
Anion gap: 5 (ref 5–15)
BUN: 26 mg/dL — ABNORMAL HIGH (ref 8–23)
CO2: 27 mmol/L (ref 22–32)
Calcium: 8.6 mg/dL — ABNORMAL LOW (ref 8.9–10.3)
Chloride: 108 mmol/L (ref 98–111)
Creatinine, Ser: 1.75 mg/dL — ABNORMAL HIGH (ref 0.61–1.24)
GFR, Estimated: 37 mL/min — ABNORMAL LOW (ref >60)
Glucose, Bld: 100 mg/dL — ABNORMAL HIGH (ref 70–99)
Potassium: 4.8 mmol/L (ref 3.5–5.1)
Sodium: 140 mmol/L (ref 135–145)

## 2020-12-16 MED ORDER — SODIUM CHLORIDE 0.45 % IV BOLUS
250.0000 mL | Freq: Once | INTRAVENOUS | Status: AC
  Administered 2020-12-16: 250 mL via INTRAVENOUS

## 2020-12-16 NOTE — Progress Notes (Signed)
Speech Language Pathology Daily Session Note  Patient Details  Name: Cesar [name] MRN: [id number] Date of Birth: 06-05-1929  Today's Date: 12/16/2020 SLP Individual Time: 7829-5621 SLP Individual Time Calculation (min): 26 min  Short Term Goals: Week 2: SLP Short Term Goal 1 (Week 2): Pt will consume trials of regular texture solids demonstrating efficient mastication, oral clearance, and safe rate of intake with Min cues from clinician prior to advancement. SLP Short Term Goal 2 (Week 2): Pt will demonstrate awareness of functional errors with Min A multimodal cues. SLP Short Term Goal 3 (Week 2): Pt will demonstrate ability to problem solve basic familiar tasks with Min A verbal and visual cues. SLP Short Term Goal 4 (Week 2): Pt will recall new and/or daily information with Supervision A verbal cues for use of comepnsatory strategies and/or aids.  Skilled Therapeutic Interventions: Pt was seen for skilled ST targeting education with pt and his wife at bedside. SLP facilitated session with functional conversation regarding discharge plans and pt's current level of functioning. Wife reports she feels prepared to take pt home and has been observing his sessions as often as possible. She demonstrated carryover of techniques for cueing that were likely discussed in PT (ex: telling pt to stay close to/inside of the walker, picking up feet higher, etc.). Skilled ST education focused mainly on dysphagia. Reviewed pt's current diet recommendations and compensatory strategies. Pt's wife demonstrated recall of how to cue pt for slower rate and check for pocketing of regular textures. SLP started conversation about pt's cognition and reviewing some areas of improvement thus far (orientation and daily recall/carryover). Pt's wife expressed that she is interested in SLP creating a list of strategies to help pt with cognitive functioning at home, which will be provided next session, as time constraints did not  allow for it today. Pt left sitting in chair with alarm set and needs within reach, wife still present. Continue per current plan of care.          Pain Pain Assessment Pain Scale: 0-10 Pain Score: 0-No pain  Therapy/Group: Individual Therapy  Cesar [name] 12/16/2020, 11:16 AM

## 2020-12-16 NOTE — Progress Notes (Signed)
PROGRESS NOTE   Subjective/Complaints:  Fluid intake <1013ml /day  Wife at bedside, discussed discharge day process, she would like her daughter on [place] to be on the phone   ROS: Denies CP, SOB, N/V/D  Objective:   No results found. Recent Labs    12/16/20 0538  WBC 7.0  HGB 11.7*  HCT 35.8*  PLT 133*   Recent Labs    12/16/20 0538  NA 140  K 4.8  CL 108  CO2 27  GLUCOSE 100*  BUN 26*  CREATININE 1.75*  CALCIUM 8.6*    Intake/Output Summary (Last 24 hours) at 12/16/2020 0907 Last data filed at 12/16/2020 0842 Gross per 24 hour  Intake 692 ml  Output 900 ml  Net -208 ml        Physical Exam: Vital Signs Blood pressure 131/76, pulse 66, temperature 98.5 F (36.9 C), resp. rate 18, height 6\' 1"  (1.854 m), weight 82.3 kg, SpO2 98 %.  General: No acute distress Mood and affect are appropriate Heart: Regular rate and rhythm no rubs murmurs or extra sounds Lungs: Clear to auscultation, breathing unlabored, no rales or wheezes Abdomen: Positive bowel sounds, soft nontender to palpation, nondistended Extremities: No clubbing, cyanosis, or edema Skin: No evidence of breakdown, no evidence of rash Neurologic: Cranial nerves II through XII intact, motor strength is 5/5 in bilateral deltoid, bicep, tricep, grip, hip flexor, knee extensors, ankle dorsiflexor and plantar flexor  Musculoskeletal: Full range of motion in all 4 extremities. No joint swelling  Very HOH  Assessment/Plan: 1. Functional deficits which require 3+ hours per day of interdisciplinary therapy in a comprehensive inpatient rehab setting.  Physiatrist is providing close team supervision and 24 hour management of active medical problems listed below.  Physiatrist and rehab team continue to assess barriers to discharge/monitor patient progress toward functional and medical goals  Care Tool:  Bathing    Body parts bathed by patient:  Right arm,Left arm,Chest,Abdomen,Right upper leg,Left upper leg,Left lower leg,Face,Front perineal area,Right lower leg,Buttocks   Body parts bathed by helper: Buttocks     Bathing assist Assist Level: Contact Guard/Touching assist     Upper Body Dressing/Undressing Upper body dressing   What is the patient wearing?: Pull over shirt,Button up shirt    Upper body assist Assist Level: Supervision/Verbal cueing    Lower Body Dressing/Undressing Lower body dressing      What is the patient wearing?: Pants,Incontinence brief     Lower body assist Assist for lower body dressing: Contact Guard/Touching assist     Toileting Toileting    Toileting assist Assist for toileting: Contact Guard/Touching assist     Transfers Chair/bed transfer  Transfers assist     Chair/bed transfer assist level: Minimal Assistance - Patient > 75% Chair/bed transfer assistive device: Programmer, multimedia   Ambulation assist      Assist level: Contact Guard/Touching assist Assistive device: Walker-rolling Max distance: 90   Walk 10 feet activity   Assist     Assist level: Contact Guard/Touching assist Assistive device: Walker-rolling   Walk 50 feet activity   Assist    Assist level: Contact Guard/Touching assist Assistive device: Walker-rolling  Walk 150 feet activity   Assist Walk 150 feet activity did not occur: Safety/medical concerns         Walk 10 feet on uneven surface  activity   Assist Walk 10 feet on uneven surfaces activity did not occur: Safety/medical concerns         Wheelchair     Assist Will patient use wheelchair at discharge?: Yes Type of Wheelchair: Manual    Wheelchair assist level: Supervision/Verbal cueing Max wheelchair distance: 150    Wheelchair 50 feet with 2 turns activity    Assist        Assist Level: Supervision/Verbal cueing   Wheelchair 150 feet activity     Assist      Assist Level:  Supervision/Verbal cueing   Medical Problem List and Plan: 1.Decreased functional mobility with altered mental statussecondary to chronic, 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 Status post bur hole 11/21/2020  Continue CIR  Previously discussed no driving, cars or tractors (state law post seizure) also no power tool use .  Still a high fall risk 2. Antithrombotics:  -DVT/anticoagulation:Subcutaneous heparin on hold,anti plt ab neg, plt count rebounding to 133K -antiplatelet therapy: N/A 3. Pain Management:Tylenol as needed 4. Mood:Provide emotional support -antipsychotic agents: N/A 5. Neuropsych: This patientisnot quitecapable of making decisions onhisown behalf. 6. Skin/Wound Care:Routine skin checks -staples out  7. Fluids/Electrolytes/Nutrition:Routine in and outs, needs enc for fluid, increased creat will give 260ml bolus today, recheck in am  8. Seizure disorder. Depakote 250 mg every 6 hours, level low on 3/10  No breakthrough seizures in CIR- 3/13- appreciate Neuro f/u for seizure med recs  9. Atrial fibrillation. Cardiac rate controlled. Eliquis on hold due to SDH x4 to 6 weeks. Reg rhythm today on exam. Vitals:   12/15/20 1904 12/16/20 0330  BP: 109/71 131/76  Pulse: (!) 55 66  Resp: 18 18  Temp: 98 F (36.7 C) 98.5 F (36.9 C)  SpO2: 98% 98%   BP controlled on 3/14 10. CKD stage III  Creatinine 1.49 on 3/8, labs ordered for tomorrow 11. Ascending thoracic aortic aneurysm. Follow-up outpatient Dr. Caffie Pinto 12. 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 Follow-up outpatient cardiology services Dr. [name] 13. BPH. Proscar 5 mg daily.  14. History of OSA. Patient not on CPAP. 15. Dysphagia. Dysphagia #3 thins liquids.   Continue to advance diet as tolerated  16.  Hematuria reported by nsg, resolved Hgb stable at 11/7  Off abx x 24h  17.  Thrombocytopenia- Hep induced  antiplatelet antibody level is normal   Improved up to 133K  LOS: 14 days A FACE TO [place] Kirsteins 12/16/2020, 9:07 AM

## 2020-12-16 NOTE — Progress Notes (Signed)
Patient easily aroused and cooperative, asleep the majority of shift, respiration unlabored on RA,coloration adequate, No active seizure or c/o of pain verbalized. Frequent rounding w/o acute distress noted, Monitored and assisted, call bell and bed alarm on, Continue current regime

## 2020-12-16 NOTE — Progress Notes (Signed)
Physical Therapy Session Note  Patient Details  Name: Cesar [name] MRN: [id number] Date of Birth: August 10, 1929  Today's Date: 12/16/2020 PT Individual Time: 0900-1010; 9518-8416 PT Individual Time Calculation (min): 70 min and 27 mins  Short Term Goals: Week 2:  PT Short Term Goal 1 (Week 2): Patient will ambulate 21ft with LRAD and MinA PT Short Term Goal 2 (Week 2): Patient will negotiate 2 steps with 1 HR and CGA PT Short Term Goal 3 (Week 2): Patient will transfer bed <> wc with CGA and LRAD  Skilled Therapeutic Interventions/Progress Updates:    Session 1: Patient received reclined in bed, wife at bedside, agreeable to PT. He denies pain. Wife requesting to attend PT session for education/observation. Patient able to come sit edge of bed with supervision. PT donning shoes with MaxA and patient transferred to wc via stand pivot with CGA. PT transporting patient in wc to therapy gym for time management and energy conservation. He was able to ambulate 65ft with RW and CGA from PT with consistent verbal cues to remain within walker frame and for increased B foot clearance. Wife then guarding patient with close supervision from PT and CGA from wife, ambulating additional 59ft. Wife reporting that patients gait is at baseline prior to this hospitalization. He then ambulated 143ft with MinA from PT demonstrating increased NBOS, shuffling and trunk flexion with fatigue. Patient able to negotiate 4 steps with B HR self-selecting reciprocal stepping when ascending and step-to when descending. MinA provided and verbal cues to have entire foot on step. Patient transferring into sedan-height car with CGA and RW. He did require frequent, extended rest breaks due to fatigue throughout therapy session. PT reviewing with patient and wife fall precautions at home, [facility] vs OP therapy for follow up therapy, that patient will require hands-on assist at all times with functional mobility at home, to call 911 if patient  falls, to limit ambulation outside/over uneven surface, and no driving/heavy machinery use. Patient and wife verbalizing understanding. Patient propelling himself in wc x187ft with supervision able to find his way back to his room without cuing. He remained up in chair at end of session, chair alarm on, call light within reach.    Session 2: Patient received sitting up in recliner, agreeable to PT. He denies pain. He was able to ambulate from recliner to wc ~18ft with RW and CGA. PT transported patient in wc to therapy gym for time management and energy conservation. He completed 7 mins on the NuStep with B UE/LE for improved endurance. PT transported patient in wc majority of the way back to his room, but he was able to ambulate final 29ft with RW and CGA to recliner. Chair alarm on, legs elevated, call light within reach.   Therapy Documentation Precautions:  Precautions Precautions: Fall Precaution Comments: HOH, expressive difficulty Restrictions Weight Bearing Restrictions: No Other Position/Activity Restrictions: HOH, does have hearing aides    Therapy/Group: Individual Therapy  Karoline Caldwell, PT, DPT, CBIS  12/16/2020, 7:42 AM

## 2020-12-16 NOTE — Progress Notes (Signed)
Occupational Therapy Session Note  Patient Details  Name: Cesar [name] MRN: [id number] Date of Birth: 06/15/1929  Today's Date: 12/16/2020 OT Individual Time: 3785-8850 OT Individual Time Calculation (min): 40 min    Short Term Goals: Week 1:  OT Short Term Goal 1 (Week 1): Pt will complete LB bathing sit to stand with min assist for two consecutive sessions. OT Short Term Goal 1 - Progress (Week 1): Met OT Short Term Goal 2 (Week 1): Pt will complete LB dressing with min assist sit to stand for two consecutive sessions. OT Short Term Goal 2 - Progress (Week 1): Not met OT Short Term Goal 3 (Week 1): Pt will complete toilet transfers with min assist using the RW for support. OT Short Term Goal 3 - Progress (Week 1): Met Week 2:  OT Short Term Goal 1 (Week 2): Pt will complete LB dressing with min assist sit to stand for two consecutive sessions. OT Short Term Goal 2 (Week 2): Pt will sequence through showering task with no more than min instructional cueing for thoroughness. OT Short Term Goal 3 (Week 2): Pt will complete LB dressing sit to stand with min guard assist for two consecutive sessions.   Skilled Therapeutic Interventions/Progress Updates:    Pt greeted at time of session supine in bed resting, easily woken with stimuli to participate in OT session. Wife [name] present as well, discussed at beginning of session compensations for toileting at night for ease for both of them, maybe using urinal to decrease time up/down. Pt's wife also had questions about medications that were on list from previous therapy session which were answered, recommended she she speak with nursing or MD if further questions. Supine > sit Supervision and stand pivot to wheelchair same manner, cues to fully turn and not sit prematurely. Transported to gym total A for time, performed 3 rounds of dynamic standing at [facility] with one hand support for 4 min, 2 min, and 1:30 min to improve balance and coorindation.  Transported back to room in same manner, walked short distance to recliner CGA with RW and alarm on call bell in reach all needs met.   Therapy Documentation Precautions:  Precautions Precautions: Fall Precaution Comments: HOH, expressive difficulty Restrictions Weight Bearing Restrictions: No Other Position/Activity Restrictions: HOH, does have hearing aides     Therapy/Group: Individual Therapy  [name] 12/16/2020, 10:57 AM

## 2020-12-17 ENCOUNTER — Ambulatory Visit: Payer: Medicare Other | Admitting: Neurology

## 2020-12-17 DIAGNOSIS — S065X0D Traumatic subdural hemorrhage without loss of consciousness, subsequent encounter: Secondary | ICD-10-CM | POA: Diagnosis not present

## 2020-12-17 DIAGNOSIS — S065X0S Traumatic subdural hemorrhage without loss of consciousness, sequela: Secondary | ICD-10-CM | POA: Diagnosis not present

## 2020-12-17 LAB — BASIC METABOLIC PANEL
Anion gap: 7 (ref 5–15)
BUN: 26 mg/dL — ABNORMAL HIGH (ref 8–23)
CO2: 26 mmol/L (ref 22–32)
Calcium: 8.5 mg/dL — ABNORMAL LOW (ref 8.9–10.3)
Chloride: 105 mmol/L (ref 98–111)
Creatinine, Ser: 1.52 mg/dL — ABNORMAL HIGH (ref 0.61–1.24)
GFR, Estimated: 44 mL/min — ABNORMAL LOW (ref >60)
Glucose, Bld: 100 mg/dL — ABNORMAL HIGH (ref 70–99)
Potassium: 4.3 mmol/L (ref 3.5–5.1)
Sodium: 138 mmol/L (ref 135–145)

## 2020-12-17 MED ORDER — SODIUM CHLORIDE 0.45 % IV BOLUS
500.0000 mL | Freq: Once | INTRAVENOUS | Status: AC
  Administered 2020-12-17: 500 mL via INTRAVENOUS

## 2020-12-17 NOTE — Discharge Summary (Signed)
Physician Discharge Summary  Patient ID: Cesar [name] MRN: [id number] DOB/AGE: 85-Oct-1930 85 y.o.  Admit date: 12/02/2020 Discharge date: 12/19/2020  Discharge Diagnoses:  Active Problems:   Traumatic subdural hematoma (HCC)   Dysphagia   Hematuria   Acute lower UTI Seizure disorder Atrial fibrillation CKD stage III Ascending thoracic aortic aneurysm Severe aortic stenosis status post TAVR procedure 10/22/2020 BPH History of OSA Thrombocytopenia.  Discharged Condition: Stable  Significant Diagnostic Studies: DG Chest 1 View  Result Date: 11/27/2020 CLINICAL DATA:  Aspiration pneumonia. EXAM: CHEST  1 VIEW COMPARISON:  November 19, 2020. FINDINGS: Stable cardiomegaly. No pneumothorax or pleural effusion is noted. Both lungs are clear. The visualized skeletal structures are unremarkable. IMPRESSION: No active disease. Aortic Atherosclerosis (ICD10-I70.0). Electronically Signed   By: Marijo Conception M.D.   On: 11/27/2020 10:36   CT HEAD WO CONTRAST  Result Date: 11/30/2020 CLINICAL DATA:  Subdural hemorrhage, follow up EXAM: CT HEAD WITHOUT CONTRAST TECHNIQUE: Contiguous axial images were obtained from the base of the skull through the vertex without intravenous contrast. COMPARISON:  11/26/2020 and prior. FINDINGS: Brain: Residual left cerebral convexity subdural collection and tiny foci of pneumocephalus are slightly less conspicuous than prior exam. Chronic bilateral frontal insults. No ventriculomegaly. 2-3 mm rightward midline shift. No mass lesion. Mild cerebral atrophy with ex vacuo dilatation. Chronic microvascular ischemic changes. Vascular: No hyperdense vessel or unexpected calcification. Skull: Postsurgical appearance of the left calvarium. Sinuses/Orbits: Normal orbits. Tiny ethmoid and left maxillary sinus mucous retention cyst. No mastoid effusion. Other: None. IMPRESSION: Decreased conspicuity of left cerebral convexity subdural collection and tiny foci of pneumocephalus.  Chronic bilateral frontal insults and microvascular ischemic changes. 2-3 mm rightward midline shift. Electronically Signed   By: Primitivo Gauze M.D.   On: 11/30/2020 14:38   CT HEAD WO CONTRAST  Result Date: 11/25/2020 CLINICAL DATA:  Altered mental status. EXAM: CT HEAD WITHOUT CONTRAST TECHNIQUE: Contiguous axial images were obtained from the base of the skull through the vertex without intravenous contrast. COMPARISON:  November 22, 2020. FINDINGS: Brain: Status post drainage of left subdural hematoma. There is decreased pneumocephalus present in the operative site. However, a stable amount of hemorrhage and fluid remains in the left subdural area compared to prior exam. Stable minimal left-to-right midline shift of 4 mm is noted. Ventricular size is within normal limits. Diffuse cortical atrophy is noted. Mild chronic ischemic white matter disease is noted. No new hemorrhage is noted. Vascular: No hyperdense vessel or unexpected calcification. Skull: Continued presence of 2 left-sided craniotomies for decompression of hematoma. Sinuses/Orbits: No acute finding. Other: None. IMPRESSION: 1. Status post drainage of left subdural hematoma. A stable amount of hemorrhage and fluid remains in the left subdural area compared to prior exam. Stable minimal left-to-right midline shift of 4 mm is noted. 2. No new hemorrhage is noted. Diffuse cortical atrophy is noted. Mild chronic ischemic white matter disease is noted. Electronically Signed   By: Marijo Conception M.D.   On: 11/25/2020 13:47   CT HEAD WO CONTRAST  Result Date: 11/22/2020 CLINICAL DATA:  Follow-up subdural hematoma EXAM: CT HEAD WITHOUT CONTRAST TECHNIQUE: Contiguous axial images were obtained from the base of the skull through the vertex without intravenous contrast. COMPARISON:  Two days ago FINDINGS: Brain: Interval drainage of subdural hematoma along the left frontal convexity. Drain is present along with remaining fluid and gas. Maximal  subdural thickness is 15 mm the midline shift is essentially resolved in the setting of atrophy Remote right frontal infarct.  Chronic small vessel ischemia in the cerebral white matter. No hydrocephalus. Vascular: No hyperdense vessel or unexpected calcification. Skull: Left-sided burr holes for drainage. Sinuses/Orbits: No acute finding IMPRESSION: Decompressed subdural hematoma on the left essentially resolved midline shift. Electronically Signed   By: Monte Fantasia M.D.   On: 11/22/2020 06:05   CT HEAD WO CONTRAST  Result Date: 11/20/2020 CLINICAL DATA:  Follow-up subdural hematoma EXAM: CT HEAD WITHOUT CONTRAST TECHNIQUE: Contiguous axial images were obtained from the base of the skull through the vertex without intravenous contrast. COMPARISON:  Yesterday FINDINGS: Brain: Mixed density subdural hematoma on the left. The high-density components are mainly strandy appearing and favor nonacute blood/septation. Maximal subdural thickness is 3 cm as measured on coronal reformats. Less prominent midline shift due to the degree of atrophy, measuring 7 mm at the septum pellucidum. No entrapment or acute infarct. Chronic small vessel ischemia in the deep cerebral white matter. Chronic encephalomalacia at the anterior right frontal lobe. Vascular: No hyperdense vessel or unexpected calcification. Skull: Normal. Negative for fracture or focal lesion. Sinuses/Orbits: Scleral buckle on the right.  No acute finding IMPRESSION: Size stable mixed density subdural hematoma measuring 3 cm in thickness along the compressed left frontal convexity. Midline shift measures 7 mm. Electronically Signed   By: Monte Fantasia M.D.   On: 11/20/2020 07:53   CT HEAD WO CONTRAST  Result Date: 11/19/2020 CLINICAL DATA:  Weakness and confusion. EXAM: CT HEAD WITHOUT CONTRAST TECHNIQUE: Contiguous axial images were obtained from the base of the skull through the vertex without intravenous contrast. COMPARISON:  CT head dated  03/15/2019. FINDINGS: Brain: A mixed density subdural fluid collection overlying the left cerebral convexity measures up to 3.0 cm in greatest depth and consistent with an acute on chronic subdural hematoma. There is associated mass effect with 5 mm left to right midline shift. The basilar cisterns are patent. There is no hydrocephalus. Periventricular white matter hypoattenuation likely represents chronic small vessel ischemic disease. Vascular: There are vascular calcifications in the carotid siphons. Skull: Normal. Negative for fracture or focal lesion. Sinuses/Orbits: Left maxillary sinus disease versus mucous retention cysts are noted. Other: None. 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 There is associated mass effect with 5 mm left to right midline shift. These results were called by telephone at the time of interpretation on 11/19/2020 at 7:09 pm to provider [facility] , who verbally acknowledged these results. Electronically Signed   By: Zerita Boers M.D.   On: 11/19/2020 19:13   CT Cervical Spine Wo Contrast  Result Date: 11/19/2020 CLINICAL DATA:  Multiple fall EXAM: CT CERVICAL SPINE WITHOUT CONTRAST TECHNIQUE: Multidetector CT imaging of the cervical spine was performed without intravenous contrast. Multiplanar CT image reconstructions were also generated. COMPARISON:  None. FINDINGS: Alignment: No subluxation.  Facet alignment is maintained. Skull base and vertebrae: No acute fracture. No primary bone lesion or focal pathologic process. Soft tissues and spinal canal: No prevertebral fluid or swelling. No visible canal hematoma. Disc levels: Multiple level degenerative change with moderate disc space narrowing at C3-C4, C5-C6 and C6-C7. Facet degenerative changes at multiple levels. Upper chest: Emphysema at the apices Other: None IMPRESSION: 1. Degenerative changes of the cervical spine. No acute osseous abnormality. 2.  Emphysema at the apices. Emphysema (ICD10-J43.9). Electronically Signed   By: Donavan Foil M.D.   On: 11/19/2020 21:01   MR ANGIO HEAD WO CONTRAST  Result Date: 11/26/2020 CLINICAL DATA:  Possible stroke.  Status post left-sided burr holes. EXAM: MRI HEAD WITHOUT CONTRAST MRA HEAD WITHOUT CONTRAST TECHNIQUE: Multiplanar, multiecho pulse sequences of the brain and surrounding structures were obtained without intravenous contrast. Angiographic images of the head were obtained using MRA technique without contrast. COMPARISON:  03/16/2019 FINDINGS: MRI HEAD FINDINGS Brain: Unchanged thickness of left convexity subdural hematoma. No acute ischemia. There is multifocal hyperintense T2-weighted signal within the white matter. Generalized volume loss without a clear lobar predilection. There are old infarcts of the right frontal lobe and left caudate head. The midline structures are normal. Vascular: Major flow voids are preserved. Skull and upper cervical spine: Normal calvarium and skull base. Visualized upper cervical spine and soft tissues are normal. Sinuses/Orbits:No paranasal sinus fluid levels or advanced mucosal thickening. No mastoid or middle ear effusion. Normal orbits. MRA HEAD FINDINGS POSTERIOR CIRCULATION: --Vertebral arteries: Normal --Inferior cerebellar arteries: Normal. --Basilar artery: Normal. --Superior cerebellar arteries: Normal. --Posterior cerebral arteries: Normal. ANTERIOR CIRCULATION: --Intracranial internal carotid arteries: Normal. --Anterior cerebral arteries (ACA): Normal. --Middle cerebral arteries (MCA): Normal. ANATOMIC VARIANTS: None IMPRESSION: 1. Unchanged thickness of left convexity subdural hematoma. No acute ischemia. 2. Normal intracranial MRA. 3. Old infarcts of the right frontal lobe and left caudate head. 4. Chronic small vessel ischemic changes of the white matter. Electronically Signed   By: Ulyses Jarred M.D.   On: 11/26/2020 02:45   MR BRAIN WO CONTRAST  Result  Date: 11/26/2020 CLINICAL DATA:  Possible stroke.  Status post left-sided burr holes. EXAM: MRI HEAD WITHOUT CONTRAST MRA HEAD WITHOUT CONTRAST TECHNIQUE: Multiplanar, multiecho pulse sequences of the brain and surrounding structures were obtained without intravenous contrast. Angiographic images of the head were obtained using MRA technique without contrast. COMPARISON:  03/16/2019 FINDINGS: MRI HEAD FINDINGS Brain: Unchanged thickness of left convexity subdural hematoma. No acute ischemia. There is multifocal hyperintense T2-weighted signal within the white matter. Generalized volume loss without a clear lobar predilection. There are old infarcts of the right frontal lobe and left caudate head. The midline structures are normal. Vascular: Major flow voids are preserved. Skull and upper cervical spine: Normal calvarium and skull base. Visualized upper cervical spine and soft tissues are normal. Sinuses/Orbits:No paranasal sinus fluid levels or advanced mucosal thickening. No mastoid or middle ear effusion. Normal orbits. MRA HEAD FINDINGS POSTERIOR CIRCULATION: --Vertebral arteries: Normal --Inferior cerebellar arteries: Normal. --Basilar artery: Normal. --Superior cerebellar arteries: Normal. --Posterior cerebral arteries: Normal. ANTERIOR CIRCULATION: --Intracranial internal carotid arteries: Normal. --Anterior cerebral arteries (ACA): Normal. --Middle cerebral arteries (MCA): Normal. ANATOMIC VARIANTS: None IMPRESSION: 1. Unchanged thickness of left convexity subdural hematoma. No acute ischemia. 2. Normal intracranial MRA. 3. Old infarcts of the right frontal lobe and left caudate head. 4. Chronic small vessel ischemic changes of the white matter. Electronically Signed   By: Ulyses Jarred M.D.   On: 11/26/2020 02:45   DG CHEST PORT 1 VIEW  Result Date: 11/19/2020 CLINICAL DATA:  Status post fall.  Weakness. EXAM: PORTABLE CHEST 1 VIEW COMPARISON:  None. FINDINGS: The lungs are hyperinflated. Mild, diffuse,  chronic appearing increased lung markings are seen. There is no evidence of acute infiltrate, pleural effusion or pneumothorax. The cardiac silhouette is moderately enlarged. An artificial aortic valve is present. Marked severity calcification of the aortic arch is seen. Multilevel degenerative changes seen throughout the thoracic spine. IMPRESSION: Chronic appearing increased lung markings without evidence of acute or active cardiopulmonary disease. Electronically Signed   By: Virgina Norfolk M.D.   On: 11/19/2020 23:49   DG Swallowing Func-Speech Pathology  Result Date: 11/29/2020 Objective Swallowing Evaluation: Type of Study: MBS-Modified Barium Swallow Study  Patient Details Name: Cesar [name] MRN: [id number] Date of Birth: [birth year] Today's Date: 11/29/2020 Time: SLP Start Time (ACUTE ONLY): 0940 -SLP Stop Time (ACUTE ONLY): 0958 SLP Time Calculation (min) (ACUTE ONLY): 18 min Past Medical History: Past Medical History: Diagnosis Date . AAA (abdominal aortic aneurysm) ([place])  . CKD (chronic kidney disease), stage III (HCC)   baseline Cr 1.4-1.5 in 2016-2017 by PCP) . CVA (cerebral infarction)  . Depression  . DVT (deep venous thrombosis) ([place]) 2006 . H/O blood clots  . Hematuria  . HOH (hard of hearing)  . Hypercholesterolemia  . OSA (obstructive sleep apnea)   intol to CPAP . Permanent atrial fibrillation ([place])  . Prostate enlargement  . Pulmonary nodules  . S/P TAVR (transcatheter aortic valve replacement) 10/22/2020  s/p TAVR with a 29 mm Edwards S3U via the TF approch by Dr. Buena Irish and Dr. Roxy Manns . Thoracic ascending aortic aneurysm ([place])   4.5cm in 11/2018, stable since 2016 . TIA (transient ischemic attack)  Past Surgical History: Past Surgical History: Procedure Laterality Date . BURR HOLE Left 11/21/2020  Procedure: LEFT Side BURR HOLES,;  Surgeon: [name], MD;  Location: [place];  Service: Neurosurgery;  Laterality: Left; . EMBOLECTOMY Left 07/02/2016  Procedure: EMBOLECTOMY LEFT FEMORAL  ARTERY;  Surgeon: [name], MD;  Location: [place];  Service: Vascular;  Laterality: Left; Marland Kitchen MESENTERIC ARTERY BYPASS N/A 08/06/2019  Procedure: SUPERIOR MESENTERIC ARTERY THROMBECTOMY;  Surgeon: [name], MD;  Location: [place];  Service: Vascular;  Laterality: N/A; . NM [place]  08/24/2002  markedly positive,subtle lateral ischemia . PATCH ANGIOPLASTY  08/06/2019  Procedure: Patch Angioplasty of superior messenteric artery with RIGHT femoral vein;  Surgeon: [name], MD;  Location: [place];  Service: Vascular;; . PROSTATE SURGERY   . RIGHT HEART CATH AND CORONARY ANGIOGRAPHY N/A 10/21/2020  Procedure: RIGHT HEART CATH AND CORONARY ANGIOGRAPHY;  Surgeon: [name], MD;  Location: [facility];  Service: Cardiovascular;  Laterality: N/A; . TEE WITHOUT CARDIOVERSION N/A 10/22/2020  Procedure: TRANSESOPHAGEAL ECHOCARDIOGRAM (TEE);  Surgeon: [name], MD;  Location: [place];  Service: Open Heart Surgery;  Laterality: N/A; . TRANSCATHETER AORTIC VALVE REPLACEMENT, TRANSFEMORAL Right 10/22/2020  Procedure: TRANSCATHETER AORTIC VALVE REPLACEMENT, TRANSFEMORAL;  Surgeon: [name], MD;  Location: [place];  Service: Open Heart Surgery;  Laterality: Right; . US ECHOCARDIOGRAPHY  01/06/2012  mod. AOV ca+,mild to mod. AS,mod mostly posterior MAC-mild MR HPI: 87 y.o. male with medical history significant of permanent A. fib, CKD stage III, CVA, seizure disorder, depression, history of DVT, hyperlipidemia, OSA not on CPAP, AAA, ascending thoracic aortic aneurysm, severe aortic stenosis status post TAVR 10/22/2020, nonobstructive CAD presenting to the ED via EMS for evaluation of weakness and multiple falls. Head CT showing acute on chronic left cerebral convexity subdural hematoma. Pt s/p L sided burr hole x2 and placement of subdural drain for evacuation of SDH 11/21/20. On 2/20 pt exhibited aphasia, dysarthria and increased weakness on right. MMRI negative  for changes. On continuous EEG.  Subjective: alert, daughter at bedside; now aphasic Assessment / Plan / Recommendation CHL IP CLINICAL IMPRESSIONS 11/29/2020 Clinical Impression Pt demonstrated mild oropharyngeal dysphagia without penetration or aspiration. Lower cervival osteophytes not interferring with pharyngeal function. Although he wears upper dentures, wife reports they are ill fitting since admission. Oral phase marked by anterior spill on right and mild buccal cavity pocketing.  From pharyngeal phase his laryngeal closure and tracheal coverage via epiglottis was adequate. Timing occasionally delayed with barium sitting in vallecuale for 4 seconds prior to initiation of movement. Pharyngeal integrity challenged with sequential, large straw sips thin barium x 2.  Vallecular and pyriform sinus residue was trace-min. Transit of pill in puree was cohesive and pill stopped distal esophagus with barium building slightly on top. Additional trial puree advanced pill into stomach. Wife stated pt has not reported esophageal challenges in the past. Recommend continue Dys 1, upgrade to thin liquids, cup sips preferred, remain upright after meals 30 min. As therapist documenting, wife reported pt's upper denture plate donned and was fitting. Continue ST. SLP Visit Diagnosis Dysphagia, oropharyngeal phase (R13.12) Attention and concentration deficit following -- Frontal lobe and executive function deficit following -- Impact on safety and function Mild aspiration risk   CHL IP TREATMENT RECOMMENDATION 11/29/2020 Treatment Recommendations Therapy as outlined in treatment plan below   Prognosis 11/29/2020 Prognosis for Safe Diet Advancement Good Barriers to Reach Goals Cognitive deficits Barriers/Prognosis Comment -- CHL IP DIET RECOMMENDATION 11/29/2020 SLP Diet Recommendations Dysphagia 1 (Puree) solids;Thin liquid Liquid Administration via Cup Medication Administration Whole meds with puree Compensations Minimize  environmental distractions;Slow rate;Small sips/bites;Lingual sweep for clearance of pocketing Postural Changes Seated upright at 90 degrees;Remain semi-upright after after feeds/meals (Comment)   CHL IP OTHER RECOMMENDATIONS 11/29/2020 Recommended Consults -- Oral Care Recommendations Oral care BID Other Recommendations --   CHL IP FOLLOW UP RECOMMENDATIONS 11/29/2020 Follow up Recommendations Skilled Nursing facility   [facility] IP FREQUENCY AND DURATION 11/29/2020 Speech Therapy Frequency (ACUTE ONLY) min 2x/week Treatment Duration 2 weeks      CHL IP ORAL PHASE 11/29/2020 Oral Phase Impaired Oral - Pudding Teaspoon -- Oral - Pudding Cup -- Oral - Honey Teaspoon -- Oral - Honey Cup -- Oral - Nectar Teaspoon -- Oral - Nectar Cup Right pocketing in lateral sulci Oral - Nectar Straw -- Oral - Thin Teaspoon -- Oral - Thin Cup Right anterior bolus loss Oral - Thin Straw WFL Oral - Puree WFL Oral - Mech Soft -- Oral - Regular -- Oral - Multi-Consistency -- Oral - Pill WFL Oral Phase - Comment --  CHL IP PHARYNGEAL PHASE 11/29/2020 Pharyngeal Phase Impaired Pharyngeal- Pudding Teaspoon -- Pharyngeal -- Pharyngeal- Pudding Cup -- Pharyngeal -- Pharyngeal- Honey Teaspoon -- Pharyngeal -- Pharyngeal- Honey Cup -- Pharyngeal -- Pharyngeal- Nectar Teaspoon -- Pharyngeal -- Pharyngeal- Nectar Cup Pharyngeal residue - valleculae Pharyngeal -- Pharyngeal- Nectar Straw Delayed swallow initiation-vallecula;Pharyngeal residue - valleculae Pharyngeal -- Pharyngeal- Thin Teaspoon -- Pharyngeal -- Pharyngeal- Thin Cup Pharyngeal residue - valleculae;Delayed swallow initiation-vallecula;Pharyngeal residue - pyriform Pharyngeal -- Pharyngeal- Thin Straw Pharyngeal residue - pyriform Pharyngeal -- Pharyngeal- Puree WFL Pharyngeal -- Pharyngeal- Mechanical Soft -- Pharyngeal -- Pharyngeal- Regular -- Pharyngeal -- Pharyngeal- Multi-consistency -- Pharyngeal -- Pharyngeal- Pill WFL Pharyngeal -- Pharyngeal Comment --  CHL IP CERVICAL ESOPHAGEAL  PHASE 11/29/2020 Cervical Esophageal Phase (No Data) Pudding Teaspoon -- Pudding Cup -- Honey Teaspoon -- Honey Cup -- Nectar Teaspoon -- Nectar Cup -- Nectar Straw -- Thin Teaspoon -- Thin Cup -- Thin Straw -- Puree -- Mechanical Soft -- Regular -- Multi-consistency -- Pill -- Cervical Esophageal Comment -- [name] 11/29/2020, 11:32 AM  [name].Ed Actor Pager [phone] Office [phone]             Overnight EEG with video  Result Date: 11/27/2020 Lora Havens, MD     11/27/2020 11:04 AM  Patient Name: Cesar [name] MRN: [id number] Epilepsy Attending: Lora Havens Referring Physician/Provider: Dr. Amie Portland Duration: 11/26/2020 4332 to 11/27/2020 9518  Patient history: 87 year old male with left subdural hematoma.  EEG to evaluate for seizures.  Level of alertness: awake, asleep  AEDs during EEG study: Depakote  Technical aspects: This EEG study was done with scalp electrodes positioned according to the 10-20 International system of electrode placement. Electrical activity was acquired at a sampling rate of _0  and reviewed with a high frequency filter of _1  and a low frequency filter of _2 . EEG data were recorded continuously and digitally stored.  Description: During awake state, no clear posterior dominant rhythm was seen.  Sleep was characterized by sleep spindles (12 to 14 Hz), maximal frontocentral region. EEG showed continuous generalized 6 to 9 Hz theta-alpha activity in the right hemisphere as well as 2 to 3 Hz rhythmic delta slowing in left hemisphere which at times appears sharply contoured. Hyperventilation and photic stimulation were not performed.    ABNORMALITY -Continuous slow, generalized and lateralized left hemisphere  IMPRESSION: This study is suggestive of cortical dysfunction in left hemisphere likely secondary to underlying hematoma as well as moderate diffuse encephalopathy, nonspecific etiology. No seizures or  definite epileptiform discharges were seen throughout the recording.  Lora Havens   Portable EEG  Result Date: 11/25/2020 Lora Havens, MD     11/25/2020  4:57 PM Patient Name: Cesar [name] MRN: [id number] Epilepsy Attending: Lora Havens Referring Physician/Provider: Dr. Duffy Rhody Date: 11/25/2020 Duration: 25.05 mins Patient history: 87 year old male with left subdural hematoma.  EEG to evaluate for seizures. Level of alertness: lethargic AEDs during EEG study: Depakote Technical aspects: This EEG study was done with scalp electrodes positioned according to the 10-20 International system of electrode placement. Electrical activity was acquired at a sampling rate of _3  and reviewed with a high frequency filter of _4  and a low frequency filter of _5 . EEG data were recorded continuously and digitally stored. Description: EEG showed continuous generalized 6 to 9 Hz theta-alpha activity in the right hemisphere as well as 2 to 3 Hz rhythmic delta slowing in left hemisphere which at times appears sharply contoured. Hyperventilation and photic stimulation were not performed.   ABNORMALITY -Continuous slow, generalized and lateralized left hemisphere IMPRESSION: This study is suggestive of cortical dysfunction in left hemisphere likely secondary to underlying hematoma as well as moderate diffuse encephalopathy, nonspecific etiology. No seizures or definite epileptiform discharges were seen throughout the recording. Priyanka O Yadav   LONG TERM MONITOR-LIVE TELEMETRY (3-14 DAYS)  Result Date: 12/05/2020  Permanent atrial fibrillation with generally well controlled rate, occasional RVR, no significant pauses.  Episodes of RVR are often sustained for 30-60 minutes around noon-time on multiple days, suggesting they are related to increased physical activity/exercise.  Rare ventricular ectopy is seen.  Permanent atrial fibrillation with good overall rate control and occasional  activity-related moderate rapid ventricular response.    Labs:  Basic Metabolic Panel: Recent Labs  Lab 12/16/20 0538 12/17/20 0439  NA 140 138  K 4.8 4.3  CL 108 105  CO2 27 26  GLUCOSE 100* 100*  BUN 26* 26*  CREATININE 1.75* 1.52*  CALCIUM 8.6* 8.5*    CBC: Recent Labs  Lab 12/16/20 0538  WBC 7.0  HGB 11.7*  HCT 35.8*  MCV 98.9  PLT 133*    CBG: Recent Labs  Lab 12/11/20 0555 12/11/20 1118 12/11/20 1639 12/11/20 2101 12/12/20 0536  GLUCAP 97 105* 97 98  52   Family history.  Mother with heart failure.  Father with diabetes and CVA.  Denies any colon cancer esophageal cancer or rectal cancer  Brief HPI:   Cesar [name] is a 87 y.o. right-handed male with history of atrial fibrillation maintained on Eliquis, CKD stage III, CVA, seizure disorder maintained on Depakote, history of DVT hyperlipidemia OSA not on CPAP, ascending thoracic aortic aneurysm followed by Dr. 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  Per chart review lives with spouse.  Two-level home bed and bath main level 2 steps to entry.  Independent with assistive device.  He does have a daughter with good support.  Presented 11/19/2020 after increasing weakness with frequent falls and altered mental status.  Admission chemistries unremarkable except glucose 123 creatinine 1.34 hemoglobin 12.3 valproic acid 26.  Cranial CT scan showed large chronic left subdural hematoma.  Patient underwent left-sided bur hole x2 placement of subdural drain for evacuation of subdural hematoma 11/21/2020 per Dr. Duffy Rhody.  He remained on Depakote as prior to admission.  Follow-up cranial CT scan 11/22/2020 showed decompressed subdural hematoma on the left essentially resolved.  He was cleared to begin subcutaneous heparin for DVT prophylaxis 11/25/2020.  His chronic Eliquis remained on hold for approximately 6 weeks secondary to SDH.  Close monitoring of blood pressure initially on  Cardene drip which is since been discontinued.  On 11/26/2020 patient with increasing right side weakness and aphasia.  Neurology consulted with MRI/MRA completed showing unchanged thickness of left convexity subdural hematoma.  No acute ischemia.  MRA unremarkable.  Old infarcts of the right frontal lobe and left caudate head.  Chronic small vessel ischemic changes of the white matter.  His cranial CT scan was completed for follow-up 11/30/2020 showing decrease conspicuity of left cerebral convexity subdural collection and tiny foci of pneumocephalus.  2-3 mm rightward midline shift.  EEG completed showing no seizure activity however suggestive of cortical dysfunction left hemisphere likely secondary to underlying hematoma as well as moderate diffuse encephalopathy.  Maintained on a dysphagia #1 thin liquid diet.  Therapy evaluations completed due to patient's altered mental status decreased functional mobility was admitted for a comprehensive rehab program.   Hospital Course: Cesar [name] was admitted to rehab 12/02/2020 for inpatient therapies to consist of PT, ST and OT at least three hours five days a week. Past admission physiatrist, therapy team and rehab RN have worked together to provide customized collaborative inpatient rehab.  Pertaining to patient's chronic traumatic left frontal subdural hematoma with associated multiple fall status post bur hole 11/21/2020 he would follow-up with neurosurgery.  He had been on subcutaneous heparin for DVT prophylaxis held as monitoring of platelets which had rebounded to 133,000 from 33,000.  Patient with no bleeding episodes.  His chronic Eliquis remains on hold for 4 to 6 weeks due to subdural hematoma per neurosurgery.  Cardiac rate remained controlled.  CKD stage III latest creatinine 1.52 with strict in and out monitoring.  Patient remained on Depakote for history of seizure disorder.  He did have a history of ascending thoracic aortic aneurysm as well as severe  aortic stenosis status post TAVR procedure he would follow-up cardiology services.  BPH maintained on Proscar voiding without difficulty.  His diet has been advanced to a mechanical soft.   Blood pressures were monitored on TID basis and controlled     Rehab course: During patient's stay in rehab weekly team conferences were held to monitor patient's progress, set goals  and discuss barriers to discharge. At admission, patient required moderate assist sit to stand moderate assist supine to sit moderate assist 15 feet rolling walker.  Max assist grooming minimal assist upper body bathing mod assist lower body bathing set up minimal guard upper body dressing moderate assist lower body dressing  Physical exam.  Blood pressure 144/75 pulse 64 temperature 98.5 respirations 18 oxygen saturation 98% room air Constitutional.  No acute distress HEENT.  Craniotomy site clean and dry Eyes.  Pupils round and reactive to light no discharge.nystagmus Neck.  Supple nontender no JVD without thyromegaly Cardiac regular rate and rhythm without a murmur heard Abdomen.  Soft nontender positive bowel sounds without rebound Respiratory effort normal no respiratory distress without wheeze Skin.  Warm and dry Neurologic.  Awake alert makes eye contact with examiner.  Speech was a bit dysarthric but intelligible.  Mild right central 7.  Provides his name and age follows simple commands somewhat slow to process.  Limited medical historian with noted hearing deficits.  Right upper extremity 3+ to 4/5 proximal to distal.  Right lower extremity 4/5.  Left upper extremity left lower extremity 4-4/5.  Senses pain and light touch in all fours  He/She  has had improvement in activity tolerance, balance, postural control as well as ability to compensate for deficits. He/She has had improvement in functional use RUE/LUE  and RLE/LLE as well as improvement in awareness.  Patient able to come sit edge of bed with supervision.   Donned shoes with max assist.  Ambulates 90 feet rolling walker contact-guard assist with consistent verbal cues to remain within walker.  Patient able to negotiate 4 stairs with bilateral handrails self selecting reciprocal steps when ascending and step to when descending.  He did require occasional rest breaks.  He can propel his wheelchair supervision.  He did need some assist for lower body ADLs.  Patient was very hard of hearing which limited his teaching at times with ongoing family teaching.  Required moderate cues for awareness of physical limitations.  Full family teaching completed plan discharge to home       Disposition: Discharge to home    Diet: Regular  Special Instructions: No driving smoking or alcohol  Continue to hold Eliquis until follow-up with neurosurgery.  Medications at discharge. 1.  Tylenol as needed 2.  Aspirin 81 mg p.o. daily 3.  Depakote 1000 mg p.o. nightly 4.  Colace 100 mg p.o. twice daily 5.  Proscar 5 mg p.o. daily 6.  Lipitor 80 mg daily   30-35 minutes were spent completing discharge summary and discharge planning  Discharge Instructions    Ambulatory referral to Physical Medicine Rehab   Complete by: As directed    Moderate complexity follow-up 1 to 2 weeks traumatic SDH       Follow-up Information    Kirsteins, Luanna Salk, MD Follow up.   Specialty: Physical Medicine and Rehabilitation Why: Office to call for appointment Contact information: [place] [address] [phone]        [name], MD Follow up.   Specialty: Neurosurgery Why: Call for appointment Contact information: [place] [address] [phone]        [name], MD Follow up.   Specialty: Cardiology Why: Call for appointment as needed Contact information: [address] [place] [address] [phone]        Gaye Pollack, MD Follow up.   Specialty:  Cardiothoracic Surgery  Why: Call for appointment as needed Contact information: [address] [place] [address] [phone]               Signed: Lavon Paganini [place] 12/18/2020, 5:13 AM

## 2020-12-17 NOTE — Consult Note (Signed)
Neuropsychological Consultation   Patient:   Cesar [name]   DOB:   08-01-1929  MR Number:  [id number]  Location:  [facility] [id number] [place] [address] Dept: [place]: [phone]           Date of Service:   12/17/2020  Start Time:   1 PM End Time:   2 PM  Provider/Observer:  Ilean Skill, Psy.D.       Clinical Neuropsychologist       Billing Code/Service: [phone]  Chief Complaint:    Cesar [name] is an 87 year old male with history of A. fib, chronic kidney disease stage III, CVA, seizure disorder maintained on Depakote, history of DVT, hyperlipidemia, obstructive sleep apnea not on CPAP, a sending thoracic aortic aneurysm, severe aortic stenosis status post TAVR procedure 10/22/2020, nonobstructive CAD.  Patient presented on 11/19/2020 after increasing weakness with frequent falls and altered mental status.  Cranial CT scan showed large chronic left subdural hematoma.  Patient underwent left-sided bur hole x2 placement of subdural drain for evacuation of subdural hematoma.  Patient previously been on Depakote and remains on Depakote for seizure prophylaxis.  Follow-up cranial CT scan showed decompressed subdural hematoma on the left essentially resolved.  There is also chronic small vessel ischemic changes noted in the white matter.  Reason for Service:  Patient was referred for neuropsychological consultation due to coping and adjustment issues with extended hospital stay, multiple cerebrovascular events and issues and multiple medical interventions.  The patient has done well cognitively with the exception of fatigue and drowsiness particularly with around time of taking his Depakote.  Below see HPI for the current admission.  Cesar [name] is an 87 year old right-handed male with history of atrial fibrillation maintained on Eliquis, CKD stage III, CVA, seizure  disorder maintained on Depakote, history of DVT, hyperlipidemia, OSA not on CPAP, ascending thoracic aortic aneurysm followed by Dr. Caffie Pinto, severe aortic stenosis status post TAVR procedure 10/22/2020, nonobstructive CAD. Per chart review lives with spouse. Two-level home bed and bath main level 2 steps to entry. Independent with assistive device. He does have a daughter with good support. Presented 11/19/2020 after increasing weakness with frequent falls and altered mental status. Admission chemistries unremarkable except glucose 123, creatinine 1.34, hemoglobin 12.3, valproic acid level 26. Cranial CT scan showed large chronic left subdural hematoma. Patient underwent left-sided bur hole x2 placement of subdural drain for evacuation of subdural hematoma 11/21/2020 per Dr. Duffy Rhody. He remains on Depakote as prior to admission Follow-up cranial CT scan 11/22/2020 showed decompressed subdural hematoma on the left essentially resolved. He was cleared to begin subcutaneous heparin for DVT prophylaxis 11/25/2020. His chronic Eliquis remained on hold for approximately 6 weeks secondary to SDH. Close monitoring of blood pressure initially on Cardene drip which is since been discontinued. On 11/26/2020 patient with increasing right side weakness and aphasia. Neurology consulted with MRI/MRA completed showing unchanged thickness of left convexity subdural hematoma. No acute ischemia. MRA unremarkable. Old infarcts of the right frontal lobe and left caudate head. Chronic small vessel ischemic changes of the white matter. His cranial CT scan was completed for follow-up 11/30/2020 showing decreased conspicuity of left cerebral convexity subdural collection and tiny foci of pneumocephalus. 2-3 mm rightward midline shift. EEG completed showing no seizure activity however suggestive of cortical dysfunction left hemisphere likely secondary to underlying hematoma as well as moderate diffuse encephalopathy.  Currently maintained on a dysphagia #1  thin liquid diet.. Therapy evaluations completed due to patient's altered mental status decreased functional mobility he was admitted for a comprehensive rehab program.  Current Status:  Upon entering the room, the patient was sitting in his wheelchair and initially addressed but was very drowsy fairly early on in her interaction.  Patient is hard of hearing and efforts were made to make sure that the patient understood my questions.  However, after a brief interaction between myself and the patient the patient's wife anxiously stated she had a number of questions to ask.  Most of these had to do with medications and even though I informed her that this was not my area of expertise or specialty she persisted in asking questions about various medications particularly blood thinning agents.  Issues ranged from concerned about cost and how she may be able to get these medications without spending the 100s of dollars that they really did not need going forward.  I suggested that she talk to social work about possible alternatives and the patient had previously gotten medications through [employer] and she has a contact with this company and I suggested she call them again and address the question about procuring these medicines through that avenue.  The patient did appear to be oriented but quickly became very drowsy and was hard to arouse.  His wife reports that he tends to do that because of his Depakote medication.  She reports that he did have a significant witnessed seizure in the past but has not been having recurrent seizures but remains on Depakote.  Given the recent subdural hematoma prophylactic use of Depakote appears appropriate even with drowsiness.  The patient's wife reports that he has maintained his cognitive abilities for the most part and she reports that when he is awake and alert that he appears to be returning to baseline as far as  cognitive functioning.  Patient with past history of depression but today when directly asked the patient denied any significant exacerbation of his depressive symptomatology.  Behavioral Observation: Cesar [name]  presents as a 87 y.o.-year-old Right Caucasian Male who appeared his stated age. his dress was Appropriate and he was Well Groomed and his manners were Appropriate to the situation.  his participation was indicative of Drowsy behaviors.  There were physical disabilities noted.  he displayed an appropriate level of cooperation and motivation.     Interactions:    Minimal Drowsy  Attention:   abnormal and attention span appeared shorter than expected for age  Memory:   not examined;   Visuo-spatial:  not examined  Speech (Volume):  low  Speech:   normal; normal but significant hearing deficits  Thought Process:  Coherent and Relevant  Though Content:  WNL; not suicidal and not homicidal  Orientation:   person, place, time/date and situation  Judgment:   Fair  Planning:   Poor  Affect:    Flat  Mood:    Dysphoric  Insight:   Fair  Intelligence:   normal  Medical History:   Past Medical History:  Diagnosis Date  . AAA (abdominal aortic aneurysm) ([facility])   . CKD (chronic kidney disease), stage III ([facility])    baseline Cr 1.4-1.5 in 2016-2017 by PCP)  . CVA (cerebral infarction)   . Depression   . DVT (deep venous thrombosis) ([facility]) 2006  . H/O blood clots   . Hematuria   . HOH (hard of hearing)   . Hypercholesterolemia   . OSA (obstructive sleep apnea)  intol to CPAP  . Permanent atrial fibrillation ([facility])   . Prostate enlargement   . Pulmonary nodules   . 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 Buena Irish and Dr. Roxy Manns  . Thoracic ascending aortic aneurysm ([facility])    4.5cm in 11/2018, stable since 2016  . TIA (transient ischemic attack)          Patient Active Problem List    Diagnosis Date Noted  . Dysphagia   . Hematuria   . Acute lower UTI   . Traumatic subdural hematoma ([facility]) 12/02/2020  . Aphasia 11/26/2020  . Right sided weakness 11/26/2020  . Seizures ([facility])   . History of CVA (cerebrovascular accident)   . History of DVT (deep vein thrombosis)   . OSA (obstructive sleep apnea)   . Thrombocytopenia ([facility])   . Frequent falls 11/20/2020  . Subdural bleeding ([facility]) 11/20/2020  . SDH (subdural hematoma) ([facility]) 11/19/2020  . Pressure injury of skin 10/23/2020  . 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 Severe aortic stenosis   . Syncope 10/16/2020  . Gait abnormality 04/15/2020  . Confusion 11/16/2019  . Cerebrovascular accident (CVA) ([facility]) 11/16/2019  . 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 Acute ischemic stroke ([facility]) 03/15/2019  . PAD (peripheral artery disease) ([facility]) 07/23/2018  . Ischemia of lower extremity 07/12/2016  . Acute blood loss anemia 07/12/2016  . Depression 07/12/2016  . BPH (benign prostatic hyperplasia) 07/12/2016  . AAA (abdominal aortic aneurysm) ([facility])   . Atrial fibrillation ([facility])   . Embolism ([facility]) 07/02/2016  . CKD (chronic kidney disease), stage III ([facility])   . Claudication ([facility]) 06/04/2016  . Long term current use of anticoagulant 06/04/2016  . Abnormal ECG 06/04/2016  . Thoracic ascending aortic aneurysm ([facility]) 04/22/2015  . Permanent atrial fibrillation ([facility]) 05/11/2013  . Mild aortic stenosis 05/11/2013  . Orthostatic hypotension 05/11/2013  . Hypercholesterolemia 05/11/2013   Psychiatric History:  Past history of depression but on no current psychotropic medications and denies any significant current depressive symptomatology.  Family Med/Psych History:  Family History  Problem Relation Age of Onset  . Heart failure Mother   . Diabetes Father   . Stroke Father     Impression/DX:  Cesar [name] is an 87 year old male  with history of A. fib, chronic kidney disease stage III, CVA, seizure disorder maintained on Depakote, history of DVT, hyperlipidemia, obstructive sleep apnea not on CPAP, a sending thoracic aortic aneurysm, severe aortic stenosis status post TAVR procedure 10/22/2020, nonobstructive CAD.  Patient presented on 11/19/2020 after increasing weakness with frequent falls and altered mental status.  Cranial CT scan showed large chronic left subdural hematoma.  Patient underwent left-sided bur hole x2 placement of subdural drain for evacuation of subdural hematoma.  Patient previously been on Depakote and remains on Depakote for seizure prophylaxis.  Follow-up cranial CT scan showed decompressed subdural hematoma on the left essentially resolved.  There is also chronic small vessel ischemic changes noted in the white matter.  Upon entering the room, the patient was sitting in his wheelchair and initially addressed but was very drowsy fairly early on in her interaction.  Patient is hard of hearing and efforts were made to make sure that the patient understood my questions.  However, after a brief interaction between myself and the patient the patient's wife anxiously stated she had a number of  questions to ask.  Most of these had to do with medications and even though I informed her that this was not my area of expertise or specialty she persisted in asking questions about various medications particularly blood thinning agents.  Issues ranged from concerned about cost and how she may be able to get these medications without spending the 100s of dollars that they really did not need going forward.  I suggested that she talk to social work about possible alternatives and the patient had previously gotten medications through [employer] and she has a contact with this company and I suggested she call them again and address the question about procuring these medicines through that avenue.  The patient did appear  to be oriented but quickly became very drowsy and was hard to arouse.  His wife reports that he tends to do that because of his Depakote medication.  She reports that he did have a significant witnessed seizure in the past but has not been having recurrent seizures but remains on Depakote.  Given the recent subdural hematoma prophylactic use of Depakote appears appropriate even with drowsiness.  The patient's wife reports that he has maintained his cognitive abilities for the most part and she reports that when he is awake and alert that he appears to be returning to baseline as far as cognitive functioning.  Patient with past history of depression but today when directly asked the patient denied any significant exacerbation of his depressive symptomatology.  Disposition/Plan:  Patient is scheduled to be discharged on the 17th and appears to be making significant gains and progress especially given his age and past medical history.  Diagnosis:    Traumatic subdural hematoma without loss of consciousness, initial encounter [facility]) - Plan: Ambulatory referral to Physical Medicine Rehab         Electronically Signed   _______________________ Ilean Skill, Psy.D. Clinical Neuropsychologist

## 2020-12-17 NOTE — Progress Notes (Signed)
Speech Language Pathology Daily Session Note  Patient Details  Name: Cesar [name] MRN: [id number] Date of Birth: 1929/05/20  Today's Date: 12/17/2020 SLP Individual Time: 7893-8101 SLP Individual Time Calculation (min): 55 min  Short Term Goals: Week 2: SLP Short Term Goal 1 (Week 2): Pt will consume trials of regular texture solids demonstrating efficient mastication, oral clearance, and safe rate of intake with Min cues from clinician prior to advancement. SLP Short Term Goal 2 (Week 2): Pt will demonstrate awareness of functional errors with Min A multimodal cues. SLP Short Term Goal 3 (Week 2): Pt will demonstrate ability to problem solve basic familiar tasks with Min A verbal and visual cues. SLP Short Term Goal 4 (Week 2): Pt will recall new and/or daily information with Supervision A verbal cues for use of comepnsatory strategies and/or aids.   Skilled Therapeutic Interventions: Pt was seen for skilled ST targeting education with pt and his wife regarding cognitive functioning. Handouts were provided regarding all topics covered in order to assist his wife with retention of the information and future recall. SLP reviewed compensatory strategies for short term memory, problem solving, and attention to task with pt's wife. Emphasis placed on using visual/demonstration cues for problem solving, use of routines, minimizing environmental distractions, and aids such as calendars. SLP made explicit recommendations for 24/7 supervision at home for pt's greatest safety and reviewed some cueing techniques for safety awareness. Also discussed some safe home activities, advised her to provide full assistance with medication and finance management, cooking, no driving his mower/tractor, etc. Pt and wife acknowledged all recommendations. Finally, SLP addressed pt's poor frustration tolerance at times and ways for his wife to approach cueing to be most effective and/or to help him monitor some of these  behaviors. All questions regarding pt's cognitive functioning and recommendations were answered to her satisfaction. Pt with no additional questions or concerns. During a transfer from his wheelchair back to bed, pt required overall Min A for recall of sequencing and safety awareness with use of walker. He was left laying in bed with alarm set and needs within reach. Continue per current plan of care.         Pain Pain Assessment Pain Score: 0-No pain  Therapy/Group: Individual Therapy  Arbutus Leas 12/17/2020, 7:25 AM

## 2020-12-17 NOTE — Discharge Instructions (Signed)
Inpatient Rehab Discharge Instructions  Cesar [name] Discharge date and time: No discharge date for patient encounter.   Activities/Precautions/ Functional Status: Activity: activity as tolerated Diet: Regular Wound Care: Routine skin checks Functional status:  ___ No restrictions     ___ Walk up steps independently ___ 24/7 supervision/assistance   ___ Walk up steps with assistance ___ Intermittent supervision/assistance  ___ Bathe/dress independently ___ Walk with walker     _x__ Bathe/dress with assistance ___ Walk Independently    ___ Shower independently ___ Walk with assistance    ___ Shower with assistance ___ No alcohol     ___ Return to work/school ________  COMMUNITY REFERRALS UPON DISCHARGE:    Home Health:   PT     OT     ST                   Agency: [facility] Phone:[phone]    Medical Equipment/Items Ordered: Wheelchair, Conservation officer, nature, Engineer, civil (consulting), Producer, television/film/video                                                 Agency/Supplier: [employer]   Special Instructions:  No driving smoking or alcohol  Continue to hold Eliquis until follow-up with neurosurgery  My questions have been answered and I understand these instructions. I will adhere to these goals and the provided educational materials after my discharge from the hospital.  Patient/Caregiver Signature _______________________________ Date __________  Clinician Signature _______________________________________ Date __________  Please bring this form and your medication list with you to all your follow-up doctor's appointments.

## 2020-12-17 NOTE — Progress Notes (Signed)
Patient resting in bed and aroused when name is called several times, hard of hearing had taken his hearing aids out because he states its time to go to bed, cooperative, Consuming small amount of po liquids encourages to drink more liquids and he states he prefers "pepsi'drinks instead of water or juices. States he would drink a coke-cola instead and provided, consumed a small amount of water after alot of coaching.

## 2020-12-17 NOTE — Plan of Care (Signed)
  Problem: Sit to Stand Goal: LTG:  Patient will perform sit to stand in prep for activites of daily living with assistance level (OT) Description: LTG:  Patient will perform sit to stand in prep for activites of daily living with assistance level (OT) Flowsheets (Taken 12/17/2020 0745) LTG: PT will perform sit to stand in prep for activites of daily living with assistance level: (goal downgraded based on progress) Contact Guard/Touching assist Note: goal downgraded based on progress   Problem: RH Bathing Goal: LTG Patient will bathe all body parts with assist levels (OT) Description: LTG: Patient will bathe all body parts with assist levels (OT) Flowsheets (Taken 12/17/2020 0745) LTG: Pt will perform bathing with assistance level/cueing: (goal downgraded based on progress) Contact Guard/Touching assist LTG: Position pt will perform bathing:  Sit to Stand  Shower Note: goal downgraded based on progress   Problem: RH Dressing Goal: LTG Patient will perform lower body dressing w/assist (OT) Description: LTG: Patient will perform lower body dressing with assist, with/without cues in positioning using equipment (OT) Flowsheets (Taken 12/17/2020 0745) LTG: Pt will perform lower body dressing with assistance level of: (goal downgraded based on progress) Minimal Assistance - Patient > 75% Note: goal downgraded based on progress   Problem: RH Toileting Goal: LTG Patient will perform toileting task (3/3 steps) with assistance level (OT) Description: LTG: Patient will perform toileting task (3/3 steps) with assistance level (OT)  Flowsheets (Taken 12/17/2020 0745) LTG: Pt will perform toileting task (3/3 steps) with assistance level: (goal downgraded based on progress) Contact Guard/Touching assist Note: goal downgraded based on progress   Problem: RH Toilet Transfers Goal: LTG Patient will perform toilet transfers w/assist (OT) Description: LTG: Patient will perform toilet transfers with assist,  with/without cues using equipment (OT) Flowsheets (Taken 12/17/2020 0745) LTG: Pt will perform toilet transfers with assistance level of: (goal downgraded based on progress) Contact Guard/Touching assist Note: goal downgraded based on progress   Problem: RH Tub/Shower Transfers Goal: LTG Patient will perform tub/shower transfers w/assist (OT) Description: LTG: Patient will perform tub/shower transfers with assist, with/without cues using equipment (OT) Flowsheets (Taken 12/17/2020 0747) LTG: Pt will perform tub/shower stall transfers with assistance level of: Contact Guard/Touching assist LTG: Pt will perform tub/shower transfers from: Tub/shower combination Note: goal downgraded based on progress

## 2020-12-17 NOTE — Progress Notes (Signed)
Physical Therapy Session Note  Patient Details  Name: Cesar [name] MRN: [id number] Date of Birth: [birth year]  Today's Date: 12/17/2020 PT Individual Time: 0810-0906 PT Individual Time Calculation (min): 56 min   Short Term Goals: Week 2:   PT Short Term Goal 1 (Week 2): Patient will ambulate 44ft with LRAD and MinA PT Short Term Goal 2 (Week 2): Patient will negotiate 2 steps with 1 HR and CGA PT Short Term Goal 3 (Week 2): Patient will transfer bed <> wc with CGA and LRAD     Skilled Therapeutic Interventions/Progress Updates:     Pt received supine in bed and was assisted with putting on his hearing aids. Pt denied pain and was agreeable to therapy; bed alarm was turned off. Pt able to transfer to EOB with supervision. Shoes donned max assist. Transferred to standing CGA and gait training performed 200 ft from room to therapy gym with RW with observation of slower gait speed and "squat-walking" during last ~25 ft due to fatigue - demos some decreased safety awareness as pt declining seated rest break at this time. Verbal cuing needed to stay looking upright and remain within the walker when ambulating. Functional LE strengthening via stair stepping with step to pattern. 4 steps ambulated up and down 2x CGA; 1st time used both HR, 2nd time solely used right side HR to mimic assist available to enter his home. Verbal cuing needed to place entire foot on each step prior to stepping and education on facing the handrail when stepping to ambulate safely. Hip abductor strengthening via lateral stepping 1x each way in // bars with supervision, but subsequent sets not performed due to patient fatigue. Unable to attain reliable HR due to A-fib, but PT session ended due to fatigue and minor hip pain (pt verbalized this as "20% pain on R hip"). Pt verbalized hx of "bad hip." After ~5 min, pt verbalized "feeling better." Pt rolled back to room in [facility] and left in [facility] with seat alarm turned on, needs within  reach, and wife in room. Pt and wife educated on HEP consisting of lateral stepping (with UE support), supine glute bridges, and sit to stands.  Therapy Documentation Precautions:  Precautions Precautions: Fall Precaution Comments: HOH, expressive difficulty Restrictions Weight Bearing Restrictions: No Other Position/Activity Restrictions: HOH, does have hearing aides      Therapy/Group: Individual Therapy  Eleonore Chiquito, SPT 12/17/2020, 11:14 AM

## 2020-12-17 NOTE — Progress Notes (Signed)
PROGRESS NOTE   Subjective/Complaints:  S/p fluid bolus 234ml yesterday , creat slightly better, po fluid intake still low   ROS: Denies CP, SOB, N/V/D  Objective:   No results found. Recent Labs    12/16/20 0538  WBC 7.0  HGB 11.7*  HCT 35.8*  PLT 133*   Recent Labs    12/16/20 0538 12/17/20 0439  NA 140 138  K 4.8 4.3  CL 108 105  CO2 27 26  GLUCOSE 100* 100*  BUN 26* 26*  CREATININE 1.75* 1.52*  CALCIUM 8.6* 8.5*    Intake/Output Summary (Last 24 hours) at 12/17/2020 0901 Last data filed at 12/17/2020 0730 Gross per 24 hour  Intake 450.68 ml  Output 850 ml  Net -399.32 ml        Physical Exam: Vital Signs Blood pressure 140/70, pulse (!) 58, temperature 97.9 F (36.6 C), temperature source Oral, resp. rate 16, height 6\' 1"  (1.854 m), weight 82.3 kg, SpO2 98 %.  General: No acute distress Mood and affect are appropriate Heart: Regular rate and rhythm no rubs murmurs or extra sounds Lungs: Clear to auscultation, breathing unlabored, no rales or wheezes Abdomen: Positive bowel sounds, soft nontender to palpation, nondistended Extremities: No clubbing, cyanosis, or edema Skin: No evidence of breakdown, no evidence of rash  Neurologic: Cranial nerves II through XII intact, motor strength is 5/5 in bilateral deltoid, bicep, tricep, grip, hip flexor, knee extensors, ankle dorsiflexor and plantar flexor  Musculoskeletal: Full range of motion in all 4 extremities. No joint swelling  Very HOH  Assessment/Plan: 1. Functional deficits which require 3+ hours per day of interdisciplinary therapy in a comprehensive inpatient rehab setting.  Physiatrist is providing close team supervision and 24 hour management of active medical problems listed below.  Physiatrist and rehab team continue to assess barriers to discharge/monitor patient progress toward functional and medical goals  Care Tool:  Bathing     Body parts bathed by patient: Right arm,Left arm,Chest,Abdomen,Right upper leg,Left upper leg,Left lower leg,Face,Front perineal area,Right lower leg,Buttocks   Body parts bathed by helper: Buttocks     Bathing assist Assist Level: Contact Guard/Touching assist     Upper Body Dressing/Undressing Upper body dressing   What is the patient wearing?: Pull over shirt,Button up shirt    Upper body assist Assist Level: Supervision/Verbal cueing    Lower Body Dressing/Undressing Lower body dressing      What is the patient wearing?: Pants,Incontinence brief     Lower body assist Assist for lower body dressing: Contact Guard/Touching assist     Toileting Toileting    Toileting assist Assist for toileting: Contact Guard/Touching assist     Transfers Chair/bed transfer  Transfers assist     Chair/bed transfer assist level: Contact Guard/Touching assist Chair/bed transfer assistive device: Programmer, multimedia   Ambulation assist      Assist level: Contact Guard/Touching assist Assistive device: Walker-rolling Max distance: 175   Walk 10 feet activity   Assist     Assist level: Contact Guard/Touching assist Assistive device: Walker-rolling   Walk 50 feet activity   Assist    Assist level: Contact Guard/Touching assist Assistive device: Walker-rolling  Walk 150 feet activity   Assist Walk 150 feet activity did not occur: Safety/medical concerns  Assist level: Contact Guard/Touching assist Assistive device: Walker-rolling    Walk 10 feet on uneven surface  activity   Assist Walk 10 feet on uneven surfaces activity did not occur: Safety/medical concerns         Wheelchair     Assist Will patient use wheelchair at discharge?: Yes Type of Wheelchair: Manual    Wheelchair assist level: Supervision/Verbal cueing Max wheelchair distance: 150    Wheelchair 50 feet with 2 turns activity    Assist        Assist  Level: Supervision/Verbal cueing   Wheelchair 150 feet activity     Assist      Assist Level: Supervision/Verbal cueing   Medical Problem List and Plan: 1.Decreased functional mobility with altered mental statussecondary to chronic, 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 Status post bur hole 11/21/2020  Continue CIR  Previously discussed no driving, cars or tractors (state law post seizure) also no power tool use .  Still a high fall risk 2. Antithrombotics:  -DVT/anticoagulation:Subcutaneous heparin on hold,anti plt ab neg, plt count rebounding to 133K -antiplatelet therapy: N/A 3. Pain Management:Tylenol as needed 4. Mood:Provide emotional support -antipsychotic agents: N/A 5. Neuropsych: This patientisnot quitecapable of making decisions onhisown behalf. 6. Skin/Wound Care:Routine skin checks -staples out  7. Fluids/Electrolytes/Nutrition:Routine in and outs, needs enc for fluid, increased creat will give 520ml bolus today, recheck in am  8. Seizure disorder. Depakote 250 mg every 6 hours, level low on 3/10  No breakthrough seizures in CIR- 3/13- appreciate Neuro f/u for seizure med recs  9. Atrial fibrillation. Cardiac rate controlled. Eliquis on hold due to SDH x4 to 6 weeks. Reg rhythm today on exam. Vitals:   12/16/20 1940 12/17/20 0403  BP: 123/73 140/70  Pulse: 61 (!) 58  Resp: 15 16  Temp: 98 F (36.7 C) 97.9 F (36.6 C)  SpO2: 100% 98%   BP controlled on 3/15 10. CKD stage III  Creatinine elevated 1.57 but improved vs pre bolus will increase bolus to 584ml an drepeat BMET in am  11. Ascending thoracic aortic aneurysm. Follow-up outpatient Dr. Caffie Pinto 12. 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 Follow-up outpatient cardiology services Dr. [name] 13. BPH. Proscar 5 mg daily.  14. History of OSA. Patient not on CPAP. 15. Dysphagia. Dysphagia #3 thins liquids.    Continue to advance diet as tolerated  16.  Hematuria reported by nsg, resolved Hgb stable at 11.7  17.  Thrombocytopenia- Hep induced antiplatelet antibody level is normal   Improved up to 133K  LOS: 15 days A FACE TO [place] [address] Joniqua Sidle 12/17/2020, 9:01 AM

## 2020-12-17 NOTE — Progress Notes (Signed)
Occupational Therapy Session Note  Patient Details  Name: Cesar [name] MRN: [id number] Date of Birth: [birth year]  Today's Date: 12/17/2020 OT Individual Time: 7408-1448 OT Individual Time Calculation (min): 73 min    Short Term Goals: Week 2:  OT Short Term Goal 1 (Week 2): Pt will complete LB dressing with min assist sit to stand for two consecutive sessions. OT Short Term Goal 2 (Week 2): Pt will sequence through showering task with no more than min instructional cueing for thoroughness. OT Short Term Goal 3 (Week 2): Pt will complete LB dressing sit to stand with min guard assist for two consecutive sessions.  Skilled Therapeutic Interventions/Progress Updates:    Pt worked on bathing and dressing to start session with his spouse present for education.  She was able to assist with transfer to the tub bench with use of the RW and gait belt in place at min guard assist.  He continues to need mod instructional/demonstrational cueing for pushing up from the wheelchair during standing as well as reaching back to the tub bench to sit down.  He was able to remove his clothing with increased time and min guard assist.  His spouse assisted with washing his back and his hair for thoroughness as well as his buttocks.  He was able to dry off and complete functional mobility out to the wheelchair for dressing at min guard assist.  Min facilitation was needed for threading the brief over his feet but then he was able to stand and pull them up over his hips with min guard assist.  He then donned his button up pants with min guard including fastening them in standing.  Therapist assisted with TEDs and then he donned shoes at setup as well as pullover shirt.  Next, took pt down to the tub/shower room in the [place] apartment and had pt and spouse practice tub/shower transfers with use of the tub bench.  He was able to complete with min guard assist.  Spouse reports having a tub bench and BSC already at home.   Returned to the room at the end of the session with call button and phone in reach and pt staying up in the wheelchair.    Therapy Documentation Precautions:  Precautions Precautions: Fall Precaution Comments: HOH, expressive difficulty Restrictions Weight Bearing Restrictions: No Other Position/Activity Restrictions: HOH, does have hearing aides  Pain: Pain Assessment Pain Scale: 0-10 Pain Score: 0-No pain ADL: See Care Tool Section for some details of mobility and selfcare  Therapy/Group: Individual Therapy  Jakye Mullens OTR/L 12/17/2020, 4:00 PM

## 2020-12-18 ENCOUNTER — Other Ambulatory Visit: Payer: Self-pay | Admitting: Physician Assistant

## 2020-12-18 DIAGNOSIS — S065X0D Traumatic subdural hemorrhage without loss of consciousness, subsequent encounter: Secondary | ICD-10-CM | POA: Diagnosis not present

## 2020-12-18 LAB — BASIC METABOLIC PANEL
Anion gap: 6 (ref 5–15)
BUN: 26 mg/dL — ABNORMAL HIGH (ref 8–23)
CO2: 27 mmol/L (ref 22–32)
Calcium: 8.6 mg/dL — ABNORMAL LOW (ref 8.9–10.3)
Chloride: 105 mmol/L (ref 98–111)
Creatinine, Ser: 1.48 mg/dL — ABNORMAL HIGH (ref 0.61–1.24)
GFR, Estimated: 45 mL/min — ABNORMAL LOW (ref >60)
Glucose, Bld: 91 mg/dL (ref 70–99)
Potassium: 4.8 mmol/L (ref 3.5–5.1)
Sodium: 138 mmol/L (ref 135–145)

## 2020-12-18 MED ORDER — PANTOPRAZOLE SODIUM 40 MG PO TBEC: 40.0000 mg | DELAYED_RELEASE_TABLET | Freq: Every day | ORAL | 0 refills | Status: DC

## 2020-12-18 MED ORDER — DOCUSATE SODIUM 100 MG PO CAPS: 100.0000 mg | ORAL_CAPSULE | Freq: Two times a day (BID) | ORAL | 0 refills | Status: AC

## 2020-12-18 MED ORDER — DIVALPROEX SODIUM ER 500 MG PO TB24: 1000.0000 mg | ORAL_TABLET | Freq: Every day | ORAL | 0 refills | Status: AC

## 2020-12-18 MED ORDER — ACETAMINOPHEN 325 MG PO TABS: 650.0000 mg | ORAL_TABLET | Freq: Four times a day (QID) | ORAL | Status: AC | PRN

## 2020-12-18 MED ORDER — ATORVASTATIN CALCIUM 80 MG PO TABS: 80.0000 mg | ORAL_TABLET | Freq: Every day | ORAL | 0 refills | Status: AC

## 2020-12-18 MED ORDER — FINASTERIDE 5 MG PO TABS: 5.0000 mg | ORAL_TABLET | Freq: Every day | ORAL | 0 refills | Status: AC

## 2020-12-18 NOTE — Progress Notes (Signed)
                                                       PROGRESS NOTE   Subjective/Complaints:  S/p fluid bolus 250ml yesterday , creat slightly better, po fluid intake still low  Discussed estimated cost of prescription meds (only proscar and depakote ER currently) Not on DOAC which may be restarted in future per Neuro  ROS: Denies CP, SOB, N/V/D  Objective:   No results found. Recent Labs    12/16/20 0538  WBC 7.0  HGB 11.7*  HCT 35.8*  PLT 133*   Recent Labs    12/17/20 0439 12/18/20 0624  NA 138 138  K 4.3 4.8  CL 105 105  CO2 26 27  GLUCOSE 100* 91  BUN 26* 26*  CREATININE 1.52* 1.48*  CALCIUM 8.5* 8.6*    Intake/Output Summary (Last 24 hours) at 12/18/2020 0859 Last data filed at 12/18/2020 0820 Gross per 24 hour  Intake 240 ml  Output 850 ml  Net -610 ml        Physical Exam: Vital Signs Blood pressure (!) 141/72, pulse (!) 58, temperature 98 F (36.7 C), resp. rate 16, height 6' 1" (1.854 m), weight 82.3 kg, SpO2 100 %.   General: No acute distress Mood and affect are appropriate Heart: Regular rate and rhythm no rubs murmurs or extra sounds Lungs: Clear to auscultation, breathing unlabored, no rales or wheezes Abdomen: Positive bowel sounds, soft nontender to palpation, nondistended Extremities: No clubbing, cyanosis, or edema Skin: No evidence of breakdown, no evidence of rash  Neurologic: Cranial nerves II through XII intact, motor strength is 5/5 in bilateral deltoid, bicep, tricep, grip, hip flexor, knee extensors, ankle dorsiflexor and plantar flexor  Musculoskeletal: Full range of motion in all 4 extremities. No joint swelling  Very HOH  Assessment/Plan: 1. Functional deficits which require 3+ hours per day of interdisciplinary therapy in a comprehensive inpatient rehab setting.  Physiatrist is providing close team supervision and 24 hour management of active medical problems listed below.  Physiatrist and rehab team continue to  assess barriers to discharge/monitor patient progress toward functional and medical goals  Care Tool:  Bathing    Body parts bathed by patient: Right arm,Left arm,Chest,Abdomen,Right upper leg,Left upper leg,Left lower leg,Face,Front perineal area,Right lower leg,Buttocks   Body parts bathed by helper: Buttocks     Bathing assist Assist Level: Contact Guard/Touching assist     Upper Body Dressing/Undressing Upper body dressing   What is the patient wearing?: Pull over shirt    Upper body assist Assist Level: Supervision/Verbal cueing    Lower Body Dressing/Undressing Lower body dressing      What is the patient wearing?: Pants,Incontinence brief     Lower body assist Assist for lower body dressing: Minimal Assistance - Patient > 75%     Toileting Toileting    Toileting assist Assist for toileting: Contact Guard/Touching assist     Transfers Chair/bed transfer  Transfers assist     Chair/bed transfer assist level: Contact Guard/Touching assist Chair/bed transfer assistive device: Walker   Locomotion Ambulation   Ambulation assist      Assist level: Contact Guard/Touching assist Assistive device: Walker-rolling Max distance: 200   Walk 10 feet activity   Assist     Assist level: Contact Guard/Touching assist Assistive device:   Walker-rolling   Walk 50 feet activity   Assist    Assist level: Contact Guard/Touching assist Assistive device: Walker-rolling    Walk 150 feet activity   Assist Walk 150 feet activity did not occur: Safety/medical concerns  Assist level: Contact Guard/Touching assist Assistive device: Walker-rolling    Walk 10 feet on uneven surface  activity   Assist Walk 10 feet on uneven surfaces activity did not occur: Safety/medical concerns         Wheelchair     Assist Will patient use wheelchair at discharge?: Yes Type of Wheelchair: Manual    Wheelchair assist level: Supervision/Verbal cueing Max  wheelchair distance: 150    Wheelchair 50 feet with 2 turns activity    Assist        Assist Level: Supervision/Verbal cueing   Wheelchair 150 feet activity     Assist      Assist Level: Supervision/Verbal cueing   Medical Problem List and Plan: 1.Decreased functional mobility with altered mental statussecondary to chronic, 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 Status post bur hole 11/21/2020  Continue CIR Team conference today please see physician documentation under team conference tab, met with team  to discuss problems,progress, and goals. Formulized individual treatment plan based on medical history, underlying problem and comorbidities.   Previously discussed no driving, cars or tractors (state law post seizure) also no power tool use .  Still a high fall risk 2. Antithrombotics:  -DVT/anticoagulation:Subcutaneous heparin on hold,anti plt ab neg, plt count rebounding to 133K -antiplatelet therapy: N/A 3. Pain Management:Tylenol as needed 4. Mood:Provide emotional support -antipsychotic agents: N/A 5. Neuropsych: This patientisnot quitecapable of making decisions onhisown behalf. 6. Skin/Wound Care:Routine skin checks -staples out  7. Fluids/Electrolytes/Nutrition:Routine in and outs, needs enc for fluid, increased creat will give 500ml bolus today, recheck in am  8. Seizure disorder. Depakote 250 mg every 6 hours, level low on 3/10  No breakthrough seizures in CIR- 3/13- appreciate Neuro f/u for seizure med recs  9. Atrial fibrillation. Cardiac rate controlled. Eliquis on hold due to SDH x4 to 6 weeks. Reg rhythm today on exam. Vitals:   12/17/20 1910 12/18/20 0451  BP: 136/66 (!) 141/72  Pulse: 70 (!) 58  Resp: 16 16  Temp: 97.9 F (36.6 C) 98 F (36.7 C)  SpO2: 98% 100%   BP controlled on 3/15 10. CKD stage III  Creatinine elevated 1.57 but improved vs pre bolus will increase bolus to 500mlx  1 BMET back to baseline, d/c IV   11. Ascending thoracic aortic aneurysm. Follow-up outpatient Dr. Bartel 12. 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 Follow-up outpatient cardiology services Dr. Croitoru 13. BPH. Proscar 5 mg daily.  14. History of OSA. Patient not on CPAP. 15. Dysphagia. Dysphagia #3 thins liquids.   Continue to advance diet as tolerated  16.  Hematuria reported by nsg, resolved Hgb stable at 11.7  17.  Thrombocytopenia- Hep induced antiplatelet antibody level is normal   Improved up to 133K 18.  On protonix for GI prophyllaxis will d/c LOS: 16 days A FACE TO FACE EVALUATION WAS PERFORMED  Andrew E Kirsteins 12/18/2020, 8:59 AM    

## 2020-12-18 NOTE — Progress Notes (Signed)
Speech Language Pathology Discharge Summary  Patient Details  Name: Cesar [name] MRN: [id number] Date of Birth: [birth year]  Today's Date: 12/18/2020 SLP Individual Time: 7471-8550 SLP Individual Time Calculation (min): 41 min   Skilled Therapeutic Interventions: Pt was seen for skilled ST targeting dysphagia and cognitive goals. SLP facilitated session with set up and skilled observation of pt consuming his regular texture breakfast tray with thin liquids. He used safe and compensatory swallowing strategies with Supervision A verbal cues. No overt s/sx aspiration observed across solids or liquids, and at end of meal no evidence of pocketing noted. Recommend continue current diet. SLP further facilitated session with administration of the SLUMS examination as means of post-test for this admission. Pt lacked motivation today and was self-liming at times, which SLP notes likely influenced his overall score and may not truly reflect his abilities. However, his score did improve by 8 points since originally administered on day of admission to [facility] (original score 7/30 - today's score 15/30). However, deficits in problem solving, awareness, and short term memory still noted throughout evaluation, requiring most Min A verbal cues for use of strategies to compensate. Pt was oriented X4 and expressed excitement regarding plans to discharge home tomorrow. Pt left sitting in bed with alarm set and needs within reach. Continue per current plan of care.    Patient has met 5 of 5 long term goals.  Patient to discharge at overall Supervision;Min level.  Reasons goals not met: n/a   Clinical Impression/Discharge Summary:   Pt made functional gains and met 5 out of 5 long term goals this admission. Pt currently requires Min assist for most basic familiar tasks due to cognitive impairments impacting his problem solving, emergent awareness, motivation and initiation, short term memory, and selective attention. He can  also be self-limiting at times. As a result, he will require 24/7 supervision at discharge. Pt is consuming an upgraded regular textured diet with thin liquids and is using compensatory swallow strategies with overall Supervision A cues. Pt has demonstrated improved familiar problem solving, attention and participation, orientation and ability to use aids to assist with this, as well as daily functional recall and carryover of therapy information/techniques. However, given cognitive deficits still present and impacting his daily functioning, recommend pt continue to receive skilled [facility] services upon discharge. Pt and family education is complete at this time.   Care Partner:  Caregiver Able to Provide Assistance: Yes  Type of Caregiver Assistance: Cognitive  Recommendation:  24 hour supervision/assistance;Home Health SLP  Rationale for SLP Follow Up: Maximize cognitive function and independence;Reduce caregiver burden   Equipment: none   Reasons for discharge: Discharged from hospital   Patient/Family Agrees with Progress Made and Goals Achieved: Yes    Arbutus Leas 12/18/2020, 9:49 AM

## 2020-12-18 NOTE — Progress Notes (Signed)
Patient ID: Cesar [name], male   DOB: [birth year], 87 y.o.   MRN: [id number] Team Conference Report to Patient/Family  Team Conference discussion was reviewed with the patient and caregiver, including goals, any changes in plan of care and target discharge date.  Patient and caregiver express understanding and are in agreement.  The patient has a target discharge date of 12/19/20.  Dyanne Iha 12/18/2020, 1:23 PM

## 2020-12-18 NOTE — Progress Notes (Signed)
Physical Therapy Discharge Summary  Patient Details  Name: Cesar [name] MRN: [id number] Date of Birth: 1929-01-16  Today's Date: 12/18/2020 PT Individual Time: 9741-6384 PT Individual Time Calculation (min): 73 min    Patient has met 7 of 10 long term goals due to improved activity tolerance, improved balance, improved postural control, increased strength, ability to compensate for deficits, functional use of  right lower extremity and left lower extremity, improved awareness and improved coordination.  Patient to discharge at an ambulatory level  CGA  with RW.   Patient's care partner is independent to provide the necessary physical and cognitive assistance at discharge.  Reasons goals not met: Pt continues to require to CGA for sit to stand transfers, dynamic standing tasks, and household ambulation due to impaired awareness and decreased balance.  Recommendation:  Patient will benefit from ongoing skilled PT services in home health setting to continue to advance safe functional mobility, address ongoing impairments in stair negotiation, gait training, and dynamic standing balance and minimize fall risk.  Equipment: Manual Wheelchair  Reasons for discharge: treatment goals met and discharge from hospital  Patient/family agrees with progress made and goals achieved: Yes  Skilled Therapeutic Interventions/Progress Updates:  Pt received supine in bed; agreeable to therapy and denies any pain. Pt donned TED Hose max assist then transferred to seated EOB supervision. Pt changed shirt set-up assist and donned pants max assist. Pt stand pivot transferred to [facility] CGA; verbal cuing needed to make sure pt turns fully before sitting down. Briefly met by MD to discuss d/c medications. Wife present during therapy session for d/c education and practice. Pt wheeled to therapy gym for energy conservation. Car transfer practice (1x) with CGA; wife verbalized confidence in ability to perform this at home.  WC management training and education on individual WC components (brakes, foot rests, arm rests, cushion). Stair Art therapist with R HR (2x) with CGA; wife demonstrated ability to complete task after proper cuing was given. Pt education on facing towards HR throughout stair negotiation, making sure that feet are firmly and fully on stairs before ambulating, not pushing on RW upon final stair descension until stairs have been completely cleared, and making sure chair is present before planning to sit upon completion of stair descension. Wife education on gait belt use (keeping hand under belt, not over) and positioning (staying beside pt when walking, behind pt when ascending stairs, in front of pt when descending stairs). Pt verbalized needing to use bathroom during session and taken back to room. Transferred from [facility] to toilet CGA with HR use. Continent of BM, seated pericare without assist. Normal TUG completed with CGA and RW in 39.2 sec, demonstrating an 11.58 sec improvement from initial eval. Pt and wife asked if they had any additional questions and respective answers were given (wife verbalized comprehension). Written handout given with instructions and education for pt's wife. Pt left in upright in [facility] with wife in room; seat alarm turned on and needs within reach.  PT Discharge Precautions/Restrictions Precautions Precautions: Fall;Other (comment) Precaution Comments: HOH, expressive difficulty Restrictions Weight Bearing Restrictions: No Pain Pain Assessment Pain Scale: 0-10 Pain Score: 0-No pain Perception  Perception Perception: Within Functional Limits Praxis Praxis: Impaired Praxis Impairment Details: Motor planning  Cognition Overall Cognitive Status: Impaired/Different from baseline Arousal/Alertness: Awake/alert Orientation Level: Oriented X4 Attention: Selective Focused Attention: Appears intact Sustained Attention: Appears intact Selective Attention:  Impaired Selective Attention Impairment: Verbal basic;Functional basic Awareness: Impaired (Pt tends to sit quickly when turning during transfers with  poor safety awareness. Occassionally plans to sit when no chair is present at bottom of stairs.) Problem Solving: Impaired Behaviors: Impulsive Safety/Judgment: Impaired Sensation Sensation Light Touch: Impaired Detail Peripheral sensation comments: No light touch nor crude touch felt on feet. Crude touch felt on tibial region of both legs, but no light touch. Light Touch Impaired Details: Impaired RLE;Impaired LLE Hot/Cold: Not tested Proprioception: Impaired by gross assessment Stereognosis: Not tested Coordination Gross Motor Movements are Fluid and Coordinated: No Coordination and Movement Description: B LE coordination imparied likely due to motor planning impairments. Also appears to move somewhat rigid when walking. Motor  Motor Motor: Abnormal postural alignment and control;Other (comment) Motor - Discharge Observations: Generalized weakness, impaired endurance  Mobility Bed Mobility Bed Mobility: Sit to Supine;Supine to Sit Supine to Sit: Supervision/Verbal cueing Sit to Supine: Supervision/Verbal cueing Transfers Transfers: Sit to Stand;Stand to Sit;Stand Pivot Transfers Sit to Stand: Supervision/Verbal cueing;Contact Guard/Touching assist Stand to Sit: Supervision/Verbal cueing;Contact Guard/Touching assist Stand Pivot Transfers: Contact Guard/Touching assist Stand Pivot Transfer Details: Verbal cues for precautions/safety;Verbal cues for sequencing;Verbal cues for safe use of DME/AE;Tactile cues for posture Transfer (Assistive device): Rolling walker Locomotion  Gait Ambulation: Yes Gait Assistance: Contact Guard/Touching assist Gait Distance (Feet): 200 Feet Assistive device: Rolling walker Gait Assistance Details: Verbal cues for technique;Verbal cues for sequencing;Verbal cues for precautions/safety;Verbal cues for  gait pattern;Verbal cues for safe use of DME/AE;Visual cues/gestures for precautions/safety Gait Gait: Yes Gait Pattern: Impaired Gait Pattern: Step-through pattern;Decreased stride length;Decreased step length - right;Decreased step length - left;Shuffle;Decreased trunk rotation;Poor foot clearance - left;Poor foot clearance - right;Narrow base of support Gait velocity: decreased Stairs / Additional Locomotion Stairs: Yes Stairs Assistance: Contact Guard/Touching assist Stair Management Technique: One rail Right Number of Stairs: 4 Height of Stairs: 6 Ramp: Minimal Assistance - Patient >75% Wheelchair Mobility Wheelchair Mobility: Yes Wheelchair Assistance: Chartered loss adjuster: Both upper extremities Wheelchair Parts Management: Needs assistance Distance: 150 ft  Trunk/Postural Assessment  Cervical Assessment Cervical Assessment: Exceptions to [facility] (Minimal forward head and tendency to maintain downward gaze) Thoracic Assessment Thoracic Assessment: Exceptions to [facility] (Hyperkyphosis) Lumbar Assessment Lumbar Assessment: Exceptions to [facility] (Posterior pelvic tilt) Postural Control Postural Control: Deficits on evaluation Righting Reactions: delayed and inadequate with impaired motor planning Protective Responses: delayed and inadequate with impaired motor planning  Balance Balance Balance Assessed: Yes Standardized Balance Assessment Standardized Balance Assessment: Timed Up and Go Test Timed Up and Go Test TUG: Normal TUG Normal TUG (seconds): 39.2 Static Sitting Balance Static Sitting - Balance Support: Feet supported Static Sitting - Level of Assistance: 5: Stand by assistance Dynamic Sitting Balance Dynamic Sitting - Balance Support: During functional activity Dynamic Sitting - Level of Assistance: 5: Stand by assistance Static Standing Balance Static Standing - Balance Support: Bilateral upper extremity supported Static Standing - Level of  Assistance: Other (comment) (CGA) Dynamic Standing Balance Dynamic Standing - Balance Support: Bilateral upper extremity supported;During functional activity Dynamic Standing - Level of Assistance: Other (comment) (CGA) Extremity Assessment      RLE Assessment RLE Assessment: Exceptions to [facility] Active Range of Motion (AROM) Comments: WFL RLE Strength Right Hip Flexion: 4/5 Right Knee Flexion: 4+/5 Right Knee Extension: 4+/5 Right Ankle Dorsiflexion: 4+/5 Right Ankle Plantar Flexion: 4+/5 LLE Assessment LLE Assessment: Exceptions to [facility] Active Range of Motion (AROM) Comments: WFL LLE Strength Left Hip Flexion: 4/5 Left Knee Flexion: 4+/5 Left Knee Extension: 4+/5 Left Ankle Dorsiflexion: 4+/5 Left Ankle Plantar Flexion: 4+/5    Jordan Pardini, SPT 12/18/2020, 11:05 AM

## 2020-12-18 NOTE — Progress Notes (Signed)
Patient completed IV 1/2 NS Bolus per order, encouragrf po intake

## 2020-12-18 NOTE — Progress Notes (Signed)
Occupational Therapy Discharge Summary  Patient Details  Name: Cesar [name] MRN: [id number] Date of Birth: Sep 17, 1929  Today's Date: 12/18/2020 OT Individual Time: 1030-1314 OT Individual Time Calculation (min): 78 min    Session Note:  Pt completed transfer from the wheelchair to the Nustep with contact guard assist using the RW for completion of strengthening exercises.  He was able to complete intervals of 7 mins using all extremities and then 3 mins with use of the LEs only.  Resistance was set on level 5 for each set with average number of steps at 35-45.  Once complete, he transferred back to the wheelchair at the same level and was transported to the therapy gym.  There he worked on Automotive engineer while tossing horseshoes.  He was able to stand without an assistive device for tossing the horseshoes at min guard assist.  He then ambulated with min to mod assist with no device to pick them up off of the ground.  LOB noted when trying to stand back up after reaching.  He completed 5 intervals of tossing 4 shoes.  Next, had him sit on the therapy mat and work on simple problem solving block design following diagram.  He was able to complete 6 block design with supervision.  For 10 block design, he needed mod instructional cueing to fix.  Finished session with return to the wheelchair and rolling pt back to the room.  He completed toilet transfer with min guard assist using the RW.  Hygiene was completed at the same level with therapist assisting him with donning new brief, pants, and gripper socks secondary to decreased time.  Call button and phone in reach with safety alarm in place.    Patient has met 9 of 12 long term goals due to improved activity tolerance, improved balance, postural control, ability to compensate for deficits, functional use of  RIGHT upper extremity, improved attention, improved awareness and improved coordination.  Patient to discharge at [facility] Assist level.   Patient's care partner is independent to provide the necessary physical and cognitive assistance at discharge.    Reasons goals not met: He still needs supervision for self feeding and grooming tasks as well as mod to max assist for memory carryover from session to session.    Recommendation:  Patient will benefit from ongoing skilled OT services in home health setting to continue to advance functional skills in the area of BADL and Reduce care partner burden.  Pt continues to exhibit deficits with memory, safety awareness, selfcare independence, balance, and RUE gross and FM coordination.  Feel he will benefit from continued HHOT to further progress ADL status to supervision/modified independent level.    Equipment: No equipment provided  Reasons for discharge: treatment goals met and discharge from hospital  Patient/family agrees with progress made and goals achieved: Yes  OT Discharge Precautions/Restrictions  Precautions Precautions: Fall;Other (comment) Precaution Comments: HOH  Pain Pain Assessment Pain Scale: Faces Pain Score: 0-No pain ADL ADL Eating: Supervision/safety Where Assessed-Eating: Wheelchair Grooming: Supervision/safety Where Assessed-Grooming: Standing at sink Upper Body Bathing: Supervision/safety Where Assessed-Upper Body Bathing: Chair,Shower Lower Body Bathing: Contact guard Where Assessed-Lower Body Bathing: Chair,Shower Upper Body Dressing: Supervision/safety Where Assessed-Upper Body Dressing: Chair Lower Body Dressing: Minimal assistance Where Assessed-Lower Body Dressing: Wheelchair Toileting: Contact guard Where Assessed-Toileting: Bedside Commode Toilet Transfer: Therapist, music Method: Counselling psychologist: Bedside commode Tub/Shower Transfer: Metallurgist Method: Transfer board,Stand Adult nurse: Nurse, mental health  Transfer: Research scientist (life sciences) Method: Heritage manager: Gaffer Baseline Vision/History: Wears glasses Wears Glasses: Reading only Patient Visual Report: No change from baseline Vision Assessment?: Yes Ocular Range of Motion: Within Functional Limits Alignment/Gaze Preference: Within Defined Limits Tracking/Visual Pursuits: Decreased smoothness of horizontal tracking;Decreased smoothness of vertical tracking Visual Fields: No apparent deficits Perception  Perception: Within Functional Limits Praxis Praxis: Intact Praxis Impairment Details: Motor planning Cognition Overall Cognitive Status: Impaired/Different from baseline Arousal/Alertness: Awake/alert Attention: Sustained;Selective Focused Attention: Appears intact Sustained Attention: Appears intact Selective Attention: Impaired Selective Attention Impairment: Functional basic;Functional complex Memory: Impaired Memory Impairment: Decreased short term memory;Retrieval deficit Problem Solving: Impaired Problem Solving Impairment: Functional basic;Verbal basic Sensation Sensation Light Touch: Appears Intact Hot/Cold: Appears Intact Proprioception: Appears Intact Stereognosis: Not tested Additional Comments: Sensation intact grossly in BUEs. Coordination Gross Motor Movements are Fluid and Coordinated: No Fine Motor Movements are Fluid and Coordinated: No Coordination and Movement Description: Slight FM coordinaiton and gross motor coordination deficits in the RUE compared to the left.  Finger to nose testing slightly slower as well.  Pt uses the RUE with selfcare tasks at a non-dominant level. Motor  Motor Motor: Abnormal postural alignment and control;Other (comment) Motor - Discharge Observations: Generalized weakness, balance impairments and slight right UE and LE hemiparesis Mobility  Bed Mobility Bed Mobility: Sit to Supine;Supine to Sit Supine to Sit: Supervision/Verbal cueing Sit to Supine:  Supervision/Verbal cueing Transfers Sit to Stand: Supervision/Verbal cueing Stand to Sit: Supervision/Verbal cueing  Trunk/Postural Assessment  Cervical Assessment Cervical Assessment: Exceptions to [facility] (forward head) Thoracic Assessment Thoracic Assessment: Exceptions to [facility] (thoracic kyphosis) Lumbar Assessment Lumbar Assessment: Exceptions to [facility] (posterior pelvic tilt)  Balance Balance Balance Assessed: Yes Static Sitting Balance Static Sitting - Balance Support: Feet supported Static Sitting - Level of Assistance: 7: Independent Dynamic Sitting Balance Dynamic Sitting - Balance Support: During functional activity Dynamic Sitting - Level of Assistance: 5: Stand by assistance Static Standing Balance Static Standing - Balance Support: Bilateral upper extremity supported Static Standing - Level of Assistance: 5: Stand by assistance Dynamic Standing Balance Dynamic Standing - Balance Support: During functional activity Dynamic Standing - Level of Assistance: 4: Min assist Extremity/Trunk Assessment RUE Assessment RUE Assessment: Exceptions to [facility] Active Range of Motion (AROM) Comments: Shoulder flexion 0-130 degrees all other joints AROM WFLS General Strength Comments: strength overall 4/5 throughout with slight decreased gross and FM coordination compared to the left, but still able to use at a non-dominant level for selfcare tasks. LUE Assessment LUE Assessment: Within Functional Limits General Strength Comments: 5/5 throughout   Col[place]er OTR/L 12/18/2020, 4:28 PM

## 2020-12-18 NOTE — Patient Care Conference (Signed)
Inpatient RehabilitationTeam Conference and Plan of Care Update Date: 12/18/2020   Time: 10:37 AM    Patient Name: Cesar [name]      Medical Record Number: [id number]  Date of Birth: Feb 18, 1929 Sex: Male         Room/Bed: 4W26C/4W26C-01 Payor Info: Payor: MEDICARE / Plan: MEDICARE PART A AND B / Product Type: *No Product type* /    Admit Date/Time:  12/02/2020  3:30 PM  Primary Diagnosis:  <principal problem not specified>  Hospital Problems: Active Problems:   Traumatic subdural hematoma (HCC)   Dysphagia   Hematuria   Acute lower UTI    Expected Discharge Date: Expected Discharge Date: 12/19/20  Team Members Present: Physician leading conference: Dr. Alysia Penna Care Coodinator Present: Dorien Chihuahua, RN, BSN, CRRN;Christina Sampson Goon, [place] Nurse Present: Other (comment) Rodena Goldmann, RN) PT Present: Page Spiro, PT OT Present: Clyda Greener, OT SLP Present: Jettie Booze, CF-SLP PPS Coordinator present : Gunnar Fusi, SLP     Current Status/Progress Goal Weekly Team Focus  Bowel/Bladder   Patient is continent and incontinet of bladder, continent of bowel  Maintain continence  QS/PRN assess tolieting Q2 hrs   Swallow/Nutrition/ Hydration   regular textures, thin liquids, intermittent supervision  Supervision  carryover swallow strategies and tolerance of current diet   ADL's   Supervision for UB selfcarre with min assist for LB selfcare sit to stand.  He is able to complete stand pivot transfers with min guard to the [facility] as well as the tub bench.  RUE continues to be used at a non-dominant level with all selfcare tasks.  min guard to supervision  selfcare retraining, transfer training, DME education, pt education, neuromuscular re-education, balance retraining   Mobility   SPV bed mobility, CGA STS and transfers (poor safety awareness remains, but is improved), CGA gait up to 288ft RW, CGA 4 steps using R HR per home set-up  SPV/CGA overall  safety awareness,  functional transfers, gait progressions, pt/fam discharge ed, functional strength, activity tolerance, standing balance, stair training   Communication             Safety/Cognition/ Behavioral Observations  Min A basic level familiar tasks  Min A  basic familiar problem solving, safety awareness, recall and carryover of daily information/therapy techniques, emergent awareness   Pain   V=Patient verbalize no c/o pain or discomfort  maintain a pain goal below 3  QS/PRN assess and address all concerns   Skin   Skin intact  No skin breakdown while on IP Rehab  Assess skin QS/PRN address any new concerns or issues     Discharge Planning:  Goal to d/c home with spouse and son to provide care up to [place], 24/7. 2 level home, 2 steps to enter   Team Discussion: Poor po intake, poor carryover, self limiting due to stubbornness and odd behaviors.Non dominate like function of right upper extremity noted and shuffled gait. Progress limited by fatigue and need for seated rest breaks.  Patient on target to meet rehab goals: Currently supervision for upper body ADLs and min guard for lower body ADLs min guard transfers, toileting and clothes management. CGA for stairs. Able to complete familiar basic cognitive tasks with SLOP goals for min assist due to need for safety tips and poor carry over.  *See Care Plan and progress notes for long and short-term goals.   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, good wound healing from bur hole incisions, poor fluid intake has  required IV fluids  Barriers to Discharge: Medical stability;Other (comments)  Barriers to Discharge Comments: Severe hearing loss Possible Resolutions to [employer] Focus: Family training for discharge, simplify medication regimen   Continued Need for Acute Rehabilitation Level of Care: The patient requires daily medical management by a physician with specialized training in physical medicine and rehabilitation for the following reasons: Direction of a multidisciplinary physical rehabilitation program to maximize functional independence : Yes Medical management of patient stability for increased activity during participation in an intensive rehabilitation regime.: Yes Analysis of laboratory values and/or radiology reports with any subsequent need for medication adjustment and/or medical intervention. : Yes   I attest that I was present, lead the team conference, and concur with the assessment and plan of the team.   Dorien Chihuahua B 12/18/2020, 1:45 PM

## 2020-12-19 DIAGNOSIS — I639 Cerebral infarction, unspecified: Secondary | ICD-10-CM | POA: Diagnosis not present

## 2020-12-19 DIAGNOSIS — N400 Enlarged prostate without lower urinary tract symptoms: Secondary | ICD-10-CM | POA: Diagnosis not present

## 2020-12-19 DIAGNOSIS — I4891 Unspecified atrial fibrillation: Secondary | ICD-10-CM | POA: Diagnosis not present

## 2020-12-19 DIAGNOSIS — F322 Major depressive disorder, single episode, severe without psychotic features: Secondary | ICD-10-CM | POA: Diagnosis not present

## 2020-12-19 DIAGNOSIS — E7800 Pure hypercholesterolemia, unspecified: Secondary | ICD-10-CM | POA: Diagnosis not present

## 2020-12-19 DIAGNOSIS — S065X0S Traumatic subdural hemorrhage without loss of consciousness, sequela: Secondary | ICD-10-CM | POA: Diagnosis not present

## 2020-12-19 LAB — GLUCOSE, CAPILLARY: Glucose-Capillary: 98 mg/dL (ref 70–99)

## 2020-12-19 NOTE — Progress Notes (Signed)
Inpatient Rehabilitation Care Coordinator Discharge Note  The overall goal for the admission was met for:   Discharge location: Yes, home  Length of Stay: Yes, 17 Days  Discharge activity level: Yes  Home/community participation: Yes  Services provided included: MD, RD, PT, OT, SLP, RN, CM, TR, Pharmacy, Neuropsych and SW  Financial Services: Medicare  Choices offered to/list presented KT:GYBWLSL spouse  Follow-up services arranged: Home Health: [place] (or additional information): PT OT [facility], TTB, BSC  Patient/Family verbalized understanding of follow-up arrangements: Yes  Individual responsible for coordination of the follow-up plan: [place], [phone]  Confirmed correct DME delivered: Dyanne Iha 12/19/2020    Dyanne Iha

## 2020-12-19 NOTE — Progress Notes (Signed)
PROGRESS NOTE   Subjective/Complaints:  No issues overnight   ROS: Denies CP, SOB, N/V/D  Objective:   No results found. No results for input(s): WBC, HGB, HCT, PLT in the last 72 hours. Recent Labs    12/17/20 0439 12/18/20 0624  NA 138 138  K 4.3 4.8  CL 105 105  CO2 26 27  GLUCOSE 100* 91  BUN 26* 26*  CREATININE 1.52* 1.48*  CALCIUM 8.5* 8.6*    Intake/Output Summary (Last 24 hours) at 12/19/2020 0838 Last data filed at 12/19/2020 0816 Gross per 24 hour  Intake 760 ml  Output 600 ml  Net 160 ml        Physical Exam: Vital Signs Blood pressure 123/83, pulse (!) 52, temperature 98.6 F (37 C), temperature source Oral, resp. rate 17, height 6\' 1"  (1.854 m), weight 82.3 kg, SpO2 99 %.  General: No acute distress Mood and affect are appropriate Heart: Regular rate and rhythm no rubs murmurs or extra sounds Lungs: Clear to auscultation, breathing unlabored, no rales or wheezes Abdomen: Positive bowel sounds, soft nontender to palpation, nondistended Extremities: No clubbing, cyanosis, or edema Skin: No evidence of breakdown, no evidence of rash  Neurologic: Cranial nerves II through XII intact, motor strength is 5/5 in bilateral deltoid, bicep, tricep, grip, hip flexor, knee extensors, ankle dorsiflexor and plantar flexor  Musculoskeletal: Full range of motion in all 4 extremities. No joint swelling  Very HOH  Assessment/Plan: 1. Functional deficits   due to Left SDH  Care Tool:  Bathing    Body parts bathed by patient: Right arm,Left arm,Chest,Abdomen,Right upper leg,Left upper leg,Left lower leg,Face,Front perineal area,Right lower leg,Buttocks   Body parts bathed by helper: Buttocks     Bathing assist Assist Level: Contact Guard/Touching assist     Upper Body Dressing/Undressing Upper body dressing   What is the patient wearing?: Pull over shirt    Upper body assist Assist Level:  Supervision/Verbal cueing    Lower Body Dressing/Undressing Lower body dressing      What is the patient wearing?: Pants,Incontinence brief     Lower body assist Assist for lower body dressing: Minimal Assistance - Patient > 75%     Toileting Toileting    Toileting assist Assist for toileting: Contact Guard/Touching assist     Transfers Chair/bed transfer  Transfers assist     Chair/bed transfer assist level: Contact Guard/Touching assist Chair/bed transfer assistive device: Programmer, multimedia   Ambulation assist      Assist level: Contact Guard/Touching assist Assistive device: Walker-rolling Max distance: 200   Walk 10 feet activity   Assist     Assist level: Contact Guard/Touching assist Assistive device: Walker-rolling   Walk 50 feet activity   Assist    Assist level: Contact Guard/Touching assist Assistive device: Walker-rolling    Walk 150 feet activity   Assist Walk 150 feet activity did not occur: Safety/medical concerns  Assist level: Contact Guard/Touching assist Assistive device: Walker-rolling    Walk 10 feet on uneven surface  activity   Assist Walk 10 feet on uneven surfaces activity did not occur: Safety/medical concerns         Wheelchair  Assist Will patient use wheelchair at discharge?: Yes Type of Wheelchair: Manual    Wheelchair assist level: Supervision/Verbal cueing Max wheelchair distance: 150    Wheelchair 50 feet with 2 turns activity    Assist        Assist Level: Supervision/Verbal cueing   Wheelchair 150 feet activity     Assist      Assist Level: Supervision/Verbal cueing   Medical Problem List and Plan: 1.Decreased functional mobility with altered mental statussecondary to chronic, 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 Status post bur hole 11/21/2020 D/c home with family today     Previously discussed no driving, cars or tractors  (state law post seizure) also no power tool use . Also discussed no lawn mover use or driving golf cart  Still a high fall risk 2. Antithrombotics:  -DVT/anticoagulation:Subcutaneous heparin on hold,anti plt ab neg, plt count rebounding to 133K -antiplatelet therapy: N/A 3. Pain Management:Tylenol as needed 4. Mood:Provide emotional support -antipsychotic agents: N/A 5. Neuropsych: This patientisnot quitecapable of making decisions onhisown behalf. 6. Skin/Wound Care:Routine skin checks -staples out  7. Fluids/Electrolytes/Nutrition:Routine in and outs, needs enc for fluid, increased creat will give 547ml bolus today, recheck in am  8. Seizure disorder. Depakote 250 mg every 6 hours, level low on 3/10  No breakthrough seizures in CIR- 3/13- appreciate Neuro f/u for seizure med recs  9. Atrial fibrillation. Cardiac rate controlled. Eliquis on hold due to SDH x4 to 6 weeks. Reg rhythm today on exam. Vitals:   12/18/20 1932 12/19/20 0500  BP: (!) 107/55 123/83  Pulse: 62 (!) 52  Resp: 18 17  Temp: 98.5 F (36.9 C) 98.6 F (37 C)  SpO2: 100% 99%   BP controlled on 3/17 10. CKD stage III  Creatinine elevated 1.57 but improved vs pre bolus will increase bolus to 56mlx 1 BMET back to baseline, d/c IV   11. Ascending thoracic aortic aneurysm. Follow-up outpatient Dr. Caffie Pinto 12. 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 Follow-up outpatient cardiology services Dr. [name] 13. BPH. Proscar 5 mg daily.  14. History of OSA. Patient not on CPAP. 15. Dysphagia. Dysphagia #3 thins liquids.   Continue to advance diet as tolerated  16.  Hematuria reported by nsg, resolved Hgb stable at 11.7  17.  Thrombocytopenia- Hep induced antiplatelet antibody level is normal   Improved up to 133K 18.  On protonix for GI prophyllaxis will d/c LOS: 17 days A FACE TO FACE EVALUATION WAS PERFORMED  Charlett Blake 12/19/2020, 8:38 AM

## 2020-12-23 ENCOUNTER — Encounter (HOSPITAL_COMMUNITY): Payer: Self-pay | Admitting: Internal Medicine

## 2020-12-23 ENCOUNTER — Inpatient Hospital Stay (HOSPITAL_COMMUNITY): Payer: Medicare Other

## 2020-12-23 ENCOUNTER — Inpatient Hospital Stay (HOSPITAL_COMMUNITY): Payer: Medicare Other | Admitting: Certified Registered"

## 2020-12-23 ENCOUNTER — Inpatient Hospital Stay (HOSPITAL_COMMUNITY)
Admission: EM | Admit: 2020-12-23 | Discharge: 2021-01-03 | DRG: 023 | Disposition: E | Payer: Medicare Other | Attending: Neurology | Admitting: Neurology

## 2020-12-23 ENCOUNTER — Encounter (HOSPITAL_COMMUNITY): Admission: EM | Disposition: E | Payer: Self-pay | Source: Home / Self Care | Attending: Neurology

## 2020-12-23 ENCOUNTER — Emergency Department (HOSPITAL_COMMUNITY): Payer: Medicare Other

## 2020-12-23 ENCOUNTER — Other Ambulatory Visit: Payer: Self-pay

## 2020-12-23 DIAGNOSIS — G9341 Metabolic encephalopathy: Secondary | ICD-10-CM

## 2020-12-23 DIAGNOSIS — E440 Moderate protein-calorie malnutrition: Secondary | ICD-10-CM | POA: Diagnosis not present

## 2020-12-23 DIAGNOSIS — Z8249 Family history of ischemic heart disease and other diseases of the circulatory system: Secondary | ICD-10-CM

## 2020-12-23 DIAGNOSIS — J690 Pneumonitis due to inhalation of food and vomit: Secondary | ICD-10-CM | POA: Diagnosis not present

## 2020-12-23 DIAGNOSIS — I4821 Permanent atrial fibrillation: Secondary | ICD-10-CM | POA: Diagnosis present

## 2020-12-23 DIAGNOSIS — E7800 Pure hypercholesterolemia, unspecified: Secondary | ICD-10-CM | POA: Diagnosis not present

## 2020-12-23 DIAGNOSIS — I2510 Atherosclerotic heart disease of native coronary artery without angina pectoris: Secondary | ICD-10-CM | POA: Diagnosis present

## 2020-12-23 DIAGNOSIS — Z79899 Other long term (current) drug therapy: Secondary | ICD-10-CM

## 2020-12-23 DIAGNOSIS — G936 Cerebral edema: Secondary | ICD-10-CM | POA: Diagnosis present

## 2020-12-23 DIAGNOSIS — R29713 NIHSS score 13: Secondary | ICD-10-CM | POA: Diagnosis present

## 2020-12-23 DIAGNOSIS — R29810 Facial weakness: Secondary | ICD-10-CM | POA: Diagnosis present

## 2020-12-23 DIAGNOSIS — G40909 Epilepsy, unspecified, not intractable, without status epilepticus: Secondary | ICD-10-CM | POA: Diagnosis present

## 2020-12-23 DIAGNOSIS — Z6829 Body mass index (BMI) 29.0-29.9, adult: Secondary | ICD-10-CM

## 2020-12-23 DIAGNOSIS — R6520 Severe sepsis without septic shock: Secondary | ICD-10-CM | POA: Diagnosis present

## 2020-12-23 DIAGNOSIS — E1165 Type 2 diabetes mellitus with hyperglycemia: Secondary | ICD-10-CM | POA: Diagnosis not present

## 2020-12-23 DIAGNOSIS — D696 Thrombocytopenia, unspecified: Secondary | ICD-10-CM | POA: Diagnosis present

## 2020-12-23 DIAGNOSIS — R4781 Slurred speech: Secondary | ICD-10-CM | POA: Diagnosis not present

## 2020-12-23 DIAGNOSIS — R338 Other retention of urine: Secondary | ICD-10-CM | POA: Diagnosis not present

## 2020-12-23 DIAGNOSIS — I82441 Acute embolism and thrombosis of right tibial vein: Secondary | ICD-10-CM | POA: Diagnosis present

## 2020-12-23 DIAGNOSIS — R21 Rash and other nonspecific skin eruption: Secondary | ICD-10-CM | POA: Diagnosis not present

## 2020-12-23 DIAGNOSIS — N183 Chronic kidney disease, stage 3 unspecified: Secondary | ICD-10-CM | POA: Diagnosis present

## 2020-12-23 DIAGNOSIS — I6521 Occlusion and stenosis of right carotid artery: Secondary | ICD-10-CM | POA: Diagnosis present

## 2020-12-23 DIAGNOSIS — Z4682 Encounter for fitting and adjustment of non-vascular catheter: Secondary | ICD-10-CM | POA: Diagnosis not present

## 2020-12-23 DIAGNOSIS — N401 Enlarged prostate with lower urinary tract symptoms: Secondary | ICD-10-CM | POA: Diagnosis present

## 2020-12-23 DIAGNOSIS — I6601 Occlusion and stenosis of right middle cerebral artery: Secondary | ICD-10-CM | POA: Diagnosis present

## 2020-12-23 DIAGNOSIS — Z7189 Other specified counseling: Secondary | ICD-10-CM | POA: Diagnosis not present

## 2020-12-23 DIAGNOSIS — S065X9D Traumatic subdural hemorrhage with loss of consciousness of unspecified duration, subsequent encounter: Secondary | ICD-10-CM

## 2020-12-23 DIAGNOSIS — E872 Acidosis: Secondary | ICD-10-CM | POA: Diagnosis present

## 2020-12-23 DIAGNOSIS — J9601 Acute respiratory failure with hypoxia: Secondary | ICD-10-CM | POA: Diagnosis not present

## 2020-12-23 DIAGNOSIS — I63511 Cerebral infarction due to unspecified occlusion or stenosis of right middle cerebral artery: Secondary | ICD-10-CM | POA: Diagnosis not present

## 2020-12-23 DIAGNOSIS — I63131 Cerebral infarction due to embolism of right carotid artery: Principal | ICD-10-CM | POA: Diagnosis present

## 2020-12-23 DIAGNOSIS — J189 Pneumonia, unspecified organism: Secondary | ICD-10-CM | POA: Diagnosis not present

## 2020-12-23 DIAGNOSIS — W19XXXD Unspecified fall, subsequent encounter: Secondary | ICD-10-CM | POA: Diagnosis present

## 2020-12-23 DIAGNOSIS — Z7984 Long term (current) use of oral hypoglycemic drugs: Secondary | ICD-10-CM | POA: Diagnosis not present

## 2020-12-23 DIAGNOSIS — A4151 Sepsis due to Escherichia coli [E. coli]: Secondary | ICD-10-CM | POA: Diagnosis present

## 2020-12-23 DIAGNOSIS — I742 Embolism and thrombosis of arteries of the upper extremities: Secondary | ICD-10-CM | POA: Diagnosis not present

## 2020-12-23 DIAGNOSIS — R509 Fever, unspecified: Secondary | ICD-10-CM | POA: Diagnosis not present

## 2020-12-23 DIAGNOSIS — I63411 Cerebral infarction due to embolism of right middle cerebral artery: Secondary | ICD-10-CM | POA: Diagnosis not present

## 2020-12-23 DIAGNOSIS — A419 Sepsis, unspecified organism: Secondary | ICD-10-CM

## 2020-12-23 DIAGNOSIS — Z20822 Contact with and (suspected) exposure to covid-19: Secondary | ICD-10-CM | POA: Diagnosis present

## 2020-12-23 DIAGNOSIS — G9340 Encephalopathy, unspecified: Secondary | ICD-10-CM | POA: Diagnosis not present

## 2020-12-23 DIAGNOSIS — Z66 Do not resuscitate: Secondary | ICD-10-CM | POA: Diagnosis not present

## 2020-12-23 DIAGNOSIS — Z7982 Long term (current) use of aspirin: Secondary | ICD-10-CM

## 2020-12-23 DIAGNOSIS — E8770 Fluid overload, unspecified: Secondary | ICD-10-CM | POA: Diagnosis not present

## 2020-12-23 DIAGNOSIS — R1310 Dysphagia, unspecified: Secondary | ICD-10-CM | POA: Diagnosis present

## 2020-12-23 DIAGNOSIS — G4733 Obstructive sleep apnea (adult) (pediatric): Secondary | ICD-10-CM | POA: Diagnosis present

## 2020-12-23 DIAGNOSIS — R319 Hematuria, unspecified: Secondary | ICD-10-CM | POA: Diagnosis not present

## 2020-12-23 DIAGNOSIS — R4701 Aphasia: Secondary | ICD-10-CM | POA: Diagnosis present

## 2020-12-23 DIAGNOSIS — I82611 Acute embolism and thrombosis of superficial veins of right upper extremity: Secondary | ICD-10-CM | POA: Diagnosis not present

## 2020-12-23 DIAGNOSIS — Z9889 Other specified postprocedural states: Secondary | ICD-10-CM | POA: Diagnosis not present

## 2020-12-23 DIAGNOSIS — N179 Acute kidney failure, unspecified: Secondary | ICD-10-CM | POA: Diagnosis present

## 2020-12-23 DIAGNOSIS — Z01818 Encounter for other preprocedural examination: Secondary | ICD-10-CM

## 2020-12-23 DIAGNOSIS — R609 Edema, unspecified: Secondary | ICD-10-CM | POA: Diagnosis not present

## 2020-12-23 DIAGNOSIS — G8194 Hemiplegia, unspecified affecting left nondominant side: Secondary | ICD-10-CM | POA: Diagnosis present

## 2020-12-23 DIAGNOSIS — I6502 Occlusion and stenosis of left vertebral artery: Secondary | ICD-10-CM | POA: Diagnosis not present

## 2020-12-23 DIAGNOSIS — N390 Urinary tract infection, site not specified: Secondary | ICD-10-CM | POA: Diagnosis present

## 2020-12-23 DIAGNOSIS — I70228 Atherosclerosis of native arteries of extremities with rest pain, other extremity: Secondary | ICD-10-CM | POA: Diagnosis not present

## 2020-12-23 DIAGNOSIS — N1831 Chronic kidney disease, stage 3a: Secondary | ICD-10-CM | POA: Diagnosis present

## 2020-12-23 DIAGNOSIS — I6523 Occlusion and stenosis of bilateral carotid arteries: Secondary | ICD-10-CM | POA: Diagnosis not present

## 2020-12-23 DIAGNOSIS — Z794 Long term (current) use of insulin: Secondary | ICD-10-CM | POA: Diagnosis not present

## 2020-12-23 DIAGNOSIS — H9193 Unspecified hearing loss, bilateral: Secondary | ICD-10-CM | POA: Diagnosis present

## 2020-12-23 DIAGNOSIS — J95821 Acute postprocedural respiratory failure: Secondary | ICD-10-CM

## 2020-12-23 DIAGNOSIS — E663 Overweight: Secondary | ICD-10-CM | POA: Diagnosis present

## 2020-12-23 DIAGNOSIS — I82453 Acute embolism and thrombosis of peroneal vein, bilateral: Secondary | ICD-10-CM | POA: Diagnosis present

## 2020-12-23 DIAGNOSIS — E785 Hyperlipidemia, unspecified: Secondary | ICD-10-CM | POA: Diagnosis present

## 2020-12-23 DIAGNOSIS — Z8673 Personal history of transient ischemic attack (TIA), and cerebral infarction without residual deficits: Secondary | ICD-10-CM

## 2020-12-23 DIAGNOSIS — I951 Orthostatic hypotension: Secondary | ICD-10-CM | POA: Diagnosis not present

## 2020-12-23 DIAGNOSIS — R9439 Abnormal result of other cardiovascular function study: Secondary | ICD-10-CM | POA: Diagnosis not present

## 2020-12-23 DIAGNOSIS — I6200 Nontraumatic subdural hemorrhage, unspecified: Secondary | ICD-10-CM | POA: Diagnosis not present

## 2020-12-23 DIAGNOSIS — I63231 Cerebral infarction due to unspecified occlusion or stenosis of right carotid arteries: Secondary | ICD-10-CM | POA: Diagnosis not present

## 2020-12-23 DIAGNOSIS — R230 Cyanosis: Secondary | ICD-10-CM | POA: Diagnosis not present

## 2020-12-23 DIAGNOSIS — I6621 Occlusion and stenosis of right posterior cerebral artery: Secondary | ICD-10-CM | POA: Diagnosis not present

## 2020-12-23 DIAGNOSIS — Z515 Encounter for palliative care: Secondary | ICD-10-CM | POA: Diagnosis not present

## 2020-12-23 DIAGNOSIS — I998 Other disorder of circulatory system: Secondary | ICD-10-CM | POA: Diagnosis not present

## 2020-12-23 DIAGNOSIS — S065X0A Traumatic subdural hemorrhage without loss of consciousness, initial encounter: Secondary | ICD-10-CM | POA: Diagnosis not present

## 2020-12-23 DIAGNOSIS — M7989 Other specified soft tissue disorders: Secondary | ICD-10-CM

## 2020-12-23 DIAGNOSIS — R569 Unspecified convulsions: Secondary | ICD-10-CM | POA: Diagnosis not present

## 2020-12-23 DIAGNOSIS — N3001 Acute cystitis with hematuria: Secondary | ICD-10-CM | POA: Diagnosis present

## 2020-12-23 DIAGNOSIS — I517 Cardiomegaly: Secondary | ICD-10-CM | POA: Diagnosis not present

## 2020-12-23 DIAGNOSIS — R627 Adult failure to thrive: Secondary | ICD-10-CM | POA: Diagnosis not present

## 2020-12-23 DIAGNOSIS — I6603 Occlusion and stenosis of bilateral middle cerebral arteries: Secondary | ICD-10-CM | POA: Diagnosis not present

## 2020-12-23 DIAGNOSIS — Z87891 Personal history of nicotine dependence: Secondary | ICD-10-CM

## 2020-12-23 DIAGNOSIS — R9431 Abnormal electrocardiogram [ECG] [EKG]: Secondary | ICD-10-CM | POA: Diagnosis not present

## 2020-12-23 DIAGNOSIS — Z953 Presence of xenogenic heart valve: Secondary | ICD-10-CM

## 2020-12-23 DIAGNOSIS — K219 Gastro-esophageal reflux disease without esophagitis: Secondary | ICD-10-CM | POA: Diagnosis present

## 2020-12-23 DIAGNOSIS — I714 Abdominal aortic aneurysm, without rupture: Secondary | ICD-10-CM | POA: Diagnosis present

## 2020-12-23 DIAGNOSIS — R0689 Other abnormalities of breathing: Secondary | ICD-10-CM | POA: Diagnosis not present

## 2020-12-23 DIAGNOSIS — R000 Tachycardia, unspecified: Secondary | ICD-10-CM | POA: Diagnosis not present

## 2020-12-23 DIAGNOSIS — R0902 Hypoxemia: Secondary | ICD-10-CM | POA: Diagnosis not present

## 2020-12-23 DIAGNOSIS — R4182 Altered mental status, unspecified: Secondary | ICD-10-CM | POA: Diagnosis not present

## 2020-12-23 DIAGNOSIS — I6529 Occlusion and stenosis of unspecified carotid artery: Secondary | ICD-10-CM | POA: Diagnosis not present

## 2020-12-23 DIAGNOSIS — Z7901 Long term (current) use of anticoagulants: Secondary | ICD-10-CM

## 2020-12-23 DIAGNOSIS — E1152 Type 2 diabetes mellitus with diabetic peripheral angiopathy with gangrene: Secondary | ICD-10-CM | POA: Diagnosis not present

## 2020-12-23 DIAGNOSIS — I4891 Unspecified atrial fibrillation: Secondary | ICD-10-CM | POA: Diagnosis not present

## 2020-12-23 DIAGNOSIS — S065X9A Traumatic subdural hemorrhage with loss of consciousness of unspecified duration, initial encounter: Secondary | ICD-10-CM | POA: Diagnosis not present

## 2020-12-23 DIAGNOSIS — I63233 Cerebral infarction due to unspecified occlusion or stenosis of bilateral carotid arteries: Secondary | ICD-10-CM | POA: Diagnosis not present

## 2020-12-23 DIAGNOSIS — Z4659 Encounter for fitting and adjustment of other gastrointestinal appliance and device: Secondary | ICD-10-CM

## 2020-12-23 DIAGNOSIS — I639 Cerebral infarction, unspecified: Secondary | ICD-10-CM | POA: Diagnosis not present

## 2020-12-23 DIAGNOSIS — Z833 Family history of diabetes mellitus: Secondary | ICD-10-CM

## 2020-12-23 DIAGNOSIS — R54 Age-related physical debility: Secondary | ICD-10-CM | POA: Diagnosis present

## 2020-12-23 DIAGNOSIS — Z823 Family history of stroke: Secondary | ICD-10-CM

## 2020-12-23 DIAGNOSIS — I350 Nonrheumatic aortic (valve) stenosis: Secondary | ICD-10-CM | POA: Diagnosis not present

## 2020-12-23 DIAGNOSIS — Z974 Presence of external hearing-aid: Secondary | ICD-10-CM

## 2020-12-23 DIAGNOSIS — R471 Dysarthria and anarthria: Secondary | ICD-10-CM | POA: Diagnosis present

## 2020-12-23 HISTORY — PX: RADIOLOGY WITH ANESTHESIA: SHX6223

## 2020-12-23 HISTORY — PX: IR CT HEAD LTD: IMG2386

## 2020-12-23 HISTORY — PX: IR PERCUTANEOUS ART THROMBECTOMY/INFUSION INTRACRANIAL INC DIAG ANGIO: IMG6087

## 2020-12-23 LAB — BASIC METABOLIC PANEL
Anion gap: 7 (ref 5–15)
Anion gap: 6 (ref 5–15)
BUN: 23 mg/dL (ref 8–23)
BUN: 21 mg/dL (ref 8–23)
CO2: 23 mmol/L (ref 22–32)
CO2: 21 mmol/L — ABNORMAL LOW (ref 22–32)
Calcium: 8.4 mg/dL — ABNORMAL LOW (ref 8.9–10.3)
Calcium: 8 mg/dL — ABNORMAL LOW (ref 8.9–10.3)
Chloride: 109 mmol/L (ref 98–111)
Chloride: 109 mmol/L (ref 98–111)
Creatinine, Ser: 1.44 mg/dL — ABNORMAL HIGH (ref 0.61–1.24)
Creatinine, Ser: 1.18 mg/dL (ref 0.61–1.24)
GFR, Estimated: 46 mL/min — ABNORMAL LOW (ref >60)
GFR, Estimated: 59 mL/min — ABNORMAL LOW (ref >60)
Glucose, Bld: 112 mg/dL — ABNORMAL HIGH (ref 70–99)
Glucose, Bld: 100 mg/dL — ABNORMAL HIGH (ref 70–99)
Potassium: 4.5 mmol/L (ref 3.5–5.1)
Potassium: 3.9 mmol/L (ref 3.5–5.1)
Sodium: 139 mmol/L (ref 135–145)
Sodium: 136 mmol/L (ref 135–145)

## 2020-12-23 LAB — LIPID PANEL
Cholesterol: 115 mg/dL (ref 0–200)
HDL: 41 mg/dL (ref >40)
LDL Cholesterol: 60 mg/dL (ref 0–99)
Total CHOL/HDL Ratio: 2.8 RATIO
Triglycerides: 68 mg/dL (ref <150)
VLDL: 14 mg/dL (ref 0–40)

## 2020-12-23 LAB — POCT I-STAT 7, (LYTES, BLD GAS, ICA,H+H)
Acid-Base Excess: 3 mmol/L — ABNORMAL HIGH (ref 0.0–2.0)
Bicarbonate: 25.9 mmol/L (ref 20.0–28.0)
Calcium, Ion: 1.22 mmol/L (ref 1.15–1.40)
HCT: 28 % — ABNORMAL LOW (ref 39.0–52.0)
Hemoglobin: 9.5 g/dL — ABNORMAL LOW (ref 13.0–17.0)
O2 Saturation: 100 %
Patient temperature: 98.6
Potassium: 4 mmol/L (ref 3.5–5.1)
Sodium: 140 mmol/L (ref 135–145)
TCO2: 27 mmol/L (ref 22–32)
pCO2 arterial: 34.6 mmHg (ref 32.0–48.0)
pH, Arterial: 7.483 — ABNORMAL HIGH (ref 7.350–7.450)
pO2, Arterial: 538 mmHg — ABNORMAL HIGH (ref 83.0–108.0)

## 2020-12-23 LAB — URINALYSIS, ROUTINE W REFLEX MICROSCOPIC
Bilirubin Urine: NEGATIVE
Glucose, UA: NEGATIVE mg/dL
Ketones, ur: NEGATIVE mg/dL
Nitrite: POSITIVE — AB
Protein, ur: 30 mg/dL — AB
RBC / HPF: >50 RBC/hpf — ABNORMAL HIGH (ref 0–5)
Specific Gravity, Urine: 1.014 (ref 1.005–1.030)
WBC, UA: >50 WBC/hpf — ABNORMAL HIGH (ref 0–5)
pH: 5 (ref 5.0–8.0)

## 2020-12-23 LAB — LACTIC ACID, PLASMA
Lactic Acid, Venous: 2 mmol/L (ref 0.5–1.9)
Lactic Acid, Venous: 1.9 mmol/L (ref 0.5–1.9)

## 2020-12-23 LAB — CBC WITH DIFFERENTIAL/PLATELET
Abs Immature Granulocytes: 0.14 10*3/uL — ABNORMAL HIGH (ref 0.00–0.07)
Abs Immature Granulocytes: 0.07 10*3/uL (ref 0.00–0.07)
Basophils Absolute: 0.1 10*3/uL (ref 0.0–0.1)
Basophils Absolute: 0.1 10*3/uL (ref 0.0–0.1)
Basophils Relative: 0 %
Basophils Relative: 1 %
Eosinophils Absolute: 0.2 10*3/uL (ref 0.0–0.5)
Eosinophils Absolute: 0.1 10*3/uL (ref 0.0–0.5)
Eosinophils Relative: 1 %
Eosinophils Relative: 1 %
HCT: 36.7 % — ABNORMAL LOW (ref 39.0–52.0)
HCT: 32.9 % — ABNORMAL LOW (ref 39.0–52.0)
Hemoglobin: 11.7 g/dL — ABNORMAL LOW (ref 13.0–17.0)
Hemoglobin: 10.7 g/dL — ABNORMAL LOW (ref 13.0–17.0)
Immature Granulocytes: 1 %
Immature Granulocytes: 1 %
Lymphocytes Relative: 3 %
Lymphocytes Relative: 9 %
Lymphs Abs: 0.4 10*3/uL — ABNORMAL LOW (ref 0.7–4.0)
Lymphs Abs: 1 10*3/uL (ref 0.7–4.0)
MCH: 32.5 pg (ref 26.0–34.0)
MCH: 32 pg (ref 26.0–34.0)
MCHC: 31.9 g/dL (ref 30.0–36.0)
MCHC: 32.5 g/dL (ref 30.0–36.0)
MCV: 101.9 fL — ABNORMAL HIGH (ref 80.0–100.0)
MCV: 98.5 fL (ref 80.0–100.0)
Monocytes Absolute: 1.4 10*3/uL — ABNORMAL HIGH (ref 0.1–1.0)
Monocytes Absolute: 1 10*3/uL (ref 0.1–1.0)
Monocytes Relative: 9 %
Monocytes Relative: 9 %
Neutro Abs: 13.1 10*3/uL — ABNORMAL HIGH (ref 1.7–7.7)
Neutro Abs: 9.4 10*3/uL — ABNORMAL HIGH (ref 1.7–7.7)
Neutrophils Relative %: 86 %
Neutrophils Relative %: 79 %
Platelets: 162 10*3/uL (ref 150–400)
Platelets: 137 10*3/uL — ABNORMAL LOW (ref 150–400)
RBC: 3.6 MIL/uL — ABNORMAL LOW (ref 4.22–5.81)
RBC: 3.34 MIL/uL — ABNORMAL LOW (ref 4.22–5.81)
RDW: 13.7 % (ref 11.5–15.5)
RDW: 13.9 % (ref 11.5–15.5)
WBC: 15.3 10*3/uL — ABNORMAL HIGH (ref 4.0–10.5)
WBC: 11.7 10*3/uL — ABNORMAL HIGH (ref 4.0–10.5)
nRBC: 0 % (ref 0.0–0.2)
nRBC: 0 % (ref 0.0–0.2)

## 2020-12-23 LAB — COMPREHENSIVE METABOLIC PANEL
ALT: 17 U/L (ref 0–44)
AST: 27 U/L (ref 15–41)
Albumin: 2.6 g/dL — ABNORMAL LOW (ref 3.5–5.0)
Alkaline Phosphatase: 42 U/L (ref 38–126)
Anion gap: 7 (ref 5–15)
BUN: 28 mg/dL — ABNORMAL HIGH (ref 8–23)
CO2: 21 mmol/L — ABNORMAL LOW (ref 22–32)
Calcium: 8.4 mg/dL — ABNORMAL LOW (ref 8.9–10.3)
Chloride: 111 mmol/L (ref 98–111)
Creatinine, Ser: 1.49 mg/dL — ABNORMAL HIGH (ref 0.61–1.24)
GFR, Estimated: 45 mL/min — ABNORMAL LOW (ref >60)
Glucose, Bld: 124 mg/dL — ABNORMAL HIGH (ref 70–99)
Potassium: 4.5 mmol/L (ref 3.5–5.1)
Sodium: 139 mmol/L (ref 135–145)
Total Bilirubin: 0.8 mg/dL (ref 0.3–1.2)
Total Protein: 5.9 g/dL — ABNORMAL LOW (ref 6.5–8.1)

## 2020-12-23 LAB — I-STAT VENOUS BLOOD GAS, ED
Acid-base deficit: 3 mmol/L — ABNORMAL HIGH (ref 0.0–2.0)
Bicarbonate: 20.5 mmol/L (ref 20.0–28.0)
Calcium, Ion: 0.97 mmol/L — ABNORMAL LOW (ref 1.15–1.40)
HCT: 23 % — ABNORMAL LOW (ref 39.0–52.0)
Hemoglobin: 7.8 g/dL — ABNORMAL LOW (ref 13.0–17.0)
O2 Saturation: 69 %
Potassium: 5.8 mmol/L — ABNORMAL HIGH (ref 3.5–5.1)
Sodium: 141 mmol/L (ref 135–145)
TCO2: 21 mmol/L — ABNORMAL LOW (ref 22–32)
pCO2, Ven: 30.9 mmHg — ABNORMAL LOW (ref 44.0–60.0)
pH, Ven: 7.43 (ref 7.250–7.430)
pO2, Ven: 34 mmHg (ref 32.0–45.0)

## 2020-12-23 LAB — RESP PANEL BY RT-PCR (FLU A&B, COVID) ARPGX2
Influenza A by PCR: NEGATIVE
Influenza B by PCR: NEGATIVE
SARS Coronavirus 2 by RT PCR: NEGATIVE

## 2020-12-23 LAB — MRSA PCR SCREENING: MRSA by PCR: NEGATIVE

## 2020-12-23 LAB — CBC
HCT: 32 % — ABNORMAL LOW (ref 39.0–52.0)
Hemoglobin: 10.3 g/dL — ABNORMAL LOW (ref 13.0–17.0)
MCH: 32.7 pg (ref 26.0–34.0)
MCHC: 32.2 g/dL (ref 30.0–36.0)
MCV: 101.6 fL — ABNORMAL HIGH (ref 80.0–100.0)
Platelets: 147 10*3/uL — ABNORMAL LOW (ref 150–400)
RBC: 3.15 MIL/uL — ABNORMAL LOW (ref 4.22–5.81)
RDW: 14 % (ref 11.5–15.5)
WBC: 15.1 10*3/uL — ABNORMAL HIGH (ref 4.0–10.5)
nRBC: 0 % (ref 0.0–0.2)

## 2020-12-23 LAB — VALPROIC ACID LEVEL: Valproic Acid Lvl: 44 ug/mL — ABNORMAL LOW (ref 50.0–100.0)

## 2020-12-23 LAB — TSH: TSH: 2.876 u[IU]/mL (ref 0.350–4.500)

## 2020-12-23 LAB — HEMOGLOBIN A1C
Hgb A1c MFr Bld: 5.4 % (ref 4.8–5.6)
Mean Plasma Glucose: 108.28 mg/dL

## 2020-12-23 LAB — VITAMIN B12: Vitamin B-12: 541 pg/mL (ref 180–914)

## 2020-12-23 LAB — CBG MONITORING, ED: Glucose-Capillary: 107 mg/dL — ABNORMAL HIGH (ref 70–99)

## 2020-12-23 LAB — AMMONIA: Ammonia: 14 umol/L (ref 9–35)

## 2020-12-23 SURGERY — IR WITH ANESTHESIA
Anesthesia: General

## 2020-12-23 MED ORDER — ACETAMINOPHEN 160 MG/5ML PO SOLN
650.0000 mg | ORAL | Status: DC | PRN
Start: 2020-12-23 — End: 2020-12-25

## 2020-12-23 MED ORDER — IOHEXOL 300 MG/ML  SOLN
150.0000 mL | Freq: Once | INTRAMUSCULAR | Status: AC | PRN
  Administered 2020-12-23: 50 mL via INTRA_ARTERIAL

## 2020-12-23 MED ORDER — SODIUM CHLORIDE 0.9 % IV SOLN: INTRAVENOUS | Status: DC

## 2020-12-23 MED ORDER — LACTATED RINGERS IV BOLUS (SEPSIS)
1000.0000 mL | Freq: Once | INTRAVENOUS | Status: AC
  Administered 2020-12-23: 1000 mL via INTRAVENOUS

## 2020-12-23 MED ORDER — CLEVIDIPINE BUTYRATE 0.5 MG/ML IV EMUL
0.0000 mg/h | INTRAVENOUS | Status: AC
  Administered 2020-12-23: 2 mg/h via INTRAVENOUS
  Administered 2020-12-24: 3 mg/h via INTRAVENOUS
  Filled 2020-12-23: qty 50

## 2020-12-23 MED ORDER — VERAPAMIL HCL 2.5 MG/ML IV SOLN
INTRAVENOUS | Status: AC
  Filled 2020-12-23: qty 2

## 2020-12-23 MED ORDER — PROPOFOL 10 MG/ML IV BOLUS
INTRAVENOUS | Status: DC | PRN
  Administered 2020-12-23: 120 mg via INTRAVENOUS

## 2020-12-23 MED ORDER — PANTOPRAZOLE SODIUM 40 MG IV SOLR
40.0000 mg | INTRAVENOUS | Status: DC
  Administered 2020-12-23: 40 mg via INTRAVENOUS
  Filled 2020-12-23: qty 40

## 2020-12-23 MED ORDER — EPTIFIBATIDE 20 MG/10ML IV SOLN
INTRAVENOUS | Status: AC
  Filled 2020-12-23: qty 10

## 2020-12-23 MED ORDER — FENTANYL CITRATE (PF) 100 MCG/2ML IJ SOLN
25.0000 ug | INTRAMUSCULAR | Status: DC | PRN
  Administered 2020-12-23: 100 ug via INTRAVENOUS
  Administered 2020-12-24 (×2): 75 ug via INTRAVENOUS
  Administered 2020-12-24: 50 ug via INTRAVENOUS
  Filled 2020-12-23 (×5): qty 2

## 2020-12-23 MED ORDER — ACETAMINOPHEN 650 MG RE SUPP: 650.0000 mg | RECTAL | Status: DC | PRN

## 2020-12-23 MED ORDER — CEFAZOLIN SODIUM-DEXTROSE 2-4 GM/100ML-% IV SOLN
INTRAVENOUS | Status: AC
  Filled 2020-12-23: qty 100

## 2020-12-23 MED ORDER — ASPIRIN 81 MG PO CHEW
CHEWABLE_TABLET | ORAL | Status: AC
  Filled 2020-12-23: qty 1

## 2020-12-23 MED ORDER — ACETAMINOPHEN 325 MG PO TABS
650.0000 mg | ORAL_TABLET | Freq: Four times a day (QID) | ORAL | Status: DC | PRN
Start: 2020-12-23 — End: 2020-12-23

## 2020-12-23 MED ORDER — SODIUM CHLORIDE 0.9 % IV SOLN
2.0000 g | Freq: Once | INTRAVENOUS | Status: AC
  Administered 2020-12-23: 2 g via INTRAVENOUS
  Filled 2020-12-23: qty 2

## 2020-12-23 MED ORDER — CLEVIDIPINE BUTYRATE 0.5 MG/ML IV EMUL
INTRAVENOUS | Status: AC
  Filled 2020-12-23: qty 50

## 2020-12-23 MED ORDER — ACETAMINOPHEN 650 MG RE SUPP
650.0000 mg | Freq: Once | RECTAL | Status: AC
  Administered 2020-12-23: 650 mg via RECTAL
  Filled 2020-12-23: qty 1

## 2020-12-23 MED ORDER — VALPROATE SODIUM 100 MG/ML IV SOLN
1000.0000 mg | Freq: Once | INTRAVENOUS | Status: AC
  Administered 2020-12-23: 1000 mg via INTRAVENOUS
  Filled 2020-12-23 (×2): qty 10

## 2020-12-23 MED ORDER — ACETAMINOPHEN 650 MG RE SUPP: 650.0000 mg | Freq: Four times a day (QID) | RECTAL | Status: DC | PRN

## 2020-12-23 MED ORDER — SENNOSIDES-DOCUSATE SODIUM 8.6-50 MG PO TABS: 1.0000 | ORAL_TABLET | Freq: Every evening | ORAL | Status: DC | PRN

## 2020-12-23 MED ORDER — CLOPIDOGREL BISULFATE 300 MG PO TABS
ORAL_TABLET | ORAL | Status: AC
  Filled 2020-12-23: qty 1

## 2020-12-23 MED ORDER — VANCOMYCIN HCL 1500 MG/300ML IV SOLN
1500.0000 mg | Freq: Once | INTRAVENOUS | Status: AC
  Administered 2020-12-23: 1500 mg via INTRAVENOUS
  Filled 2020-12-23: qty 300

## 2020-12-23 MED ORDER — PHENYLEPHRINE HCL-NACL 10-0.9 MG/250ML-% IV SOLN
INTRAVENOUS | Status: DC | PRN
  Administered 2020-12-23: 20 ug/min via INTRAVENOUS

## 2020-12-23 MED ORDER — SODIUM CHLORIDE 0.9 % IV SOLN
2.0000 g | Freq: Two times a day (BID) | INTRAVENOUS | Status: DC
  Administered 2020-12-23 – 2020-12-25 (×4): 2 g via INTRAVENOUS
  Filled 2020-12-23 (×4): qty 2

## 2020-12-23 MED ORDER — STROKE: EARLY STAGES OF RECOVERY BOOK: Freq: Once | Status: AC

## 2020-12-23 MED ORDER — CHLORHEXIDINE GLUCONATE CLOTH 2 % EX PADS
6.0000 | MEDICATED_PAD | Freq: Every day | CUTANEOUS | Status: DC
  Administered 2020-12-23 – 2020-12-24 (×2): 6 via TOPICAL

## 2020-12-23 MED ORDER — ROCURONIUM BROMIDE 10 MG/ML (PF) SYRINGE
PREFILLED_SYRINGE | INTRAVENOUS | Status: DC | PRN
  Administered 2020-12-23: 20 mg via INTRAVENOUS
  Administered 2020-12-23: 60 mg via INTRAVENOUS

## 2020-12-23 MED ORDER — ACETAMINOPHEN 325 MG PO TABS: 650.0000 mg | ORAL_TABLET | ORAL | Status: DC | PRN

## 2020-12-23 MED ORDER — IOHEXOL 350 MG/ML SOLN
100.0000 mL | Freq: Once | INTRAVENOUS | Status: AC | PRN
  Administered 2020-12-23: 100 mL via INTRAVENOUS

## 2020-12-23 MED ORDER — PHENYLEPHRINE 40 MCG/ML (10ML) SYRINGE FOR IV PUSH (FOR BLOOD PRESSURE SUPPORT)
PREFILLED_SYRINGE | INTRAVENOUS | Status: DC | PRN
  Administered 2020-12-23: 120 ug via INTRAVENOUS

## 2020-12-23 MED ORDER — LACTATED RINGERS IV SOLN: INTRAVENOUS | Status: AC

## 2020-12-23 MED ORDER — NITROGLYCERIN 1 MG/10 ML FOR IR/CATH LAB
INTRA_ARTERIAL | Status: AC
  Administered 2020-12-23: 25 ug via INTRA_ARTERIAL
  Filled 2020-12-23: qty 10

## 2020-12-23 MED ORDER — CHLORHEXIDINE GLUCONATE 0.12% ORAL RINSE (MEDLINE KIT)
15.0000 mL | Freq: Two times a day (BID) | OROMUCOSAL | Status: DC
  Administered 2020-12-23 – 2020-12-24 (×2): 15 mL via OROMUCOSAL

## 2020-12-23 MED ORDER — VALPROATE SODIUM 100 MG/ML IV SOLN
500.0000 mg | Freq: Two times a day (BID) | INTRAVENOUS | Status: DC
  Administered 2020-12-23 – 2020-12-24 (×2): 500 mg via INTRAVENOUS
  Filled 2020-12-23 (×4): qty 5

## 2020-12-23 MED ORDER — PROPOFOL 1000 MG/100ML IV EMUL
5.0000 ug/kg/min | INTRAVENOUS | Status: DC
  Administered 2020-12-23: 15 ug/kg/min via INTRAVENOUS

## 2020-12-23 MED ORDER — POLYETHYLENE GLYCOL 3350 17 G PO PACK: 17.0000 g | PACK | Freq: Every day | ORAL | Status: DC

## 2020-12-23 MED ORDER — LACTATED RINGERS IV SOLN: INTRAVENOUS | Status: DC

## 2020-12-23 MED ORDER — TIROFIBAN HCL IN NACL 5-0.9 MG/100ML-% IV SOLN
INTRAVENOUS | Status: AC
  Filled 2020-12-23: qty 100

## 2020-12-23 MED ORDER — FENTANYL CITRATE (PF) 100 MCG/2ML IJ SOLN: 25.0000 ug | INTRAMUSCULAR | Status: DC | PRN

## 2020-12-23 MED ORDER — ORAL CARE MOUTH RINSE
15.0000 mL | OROMUCOSAL | Status: DC
  Administered 2020-12-23 – 2020-12-24 (×7): 15 mL via OROMUCOSAL

## 2020-12-23 MED ORDER — CEFAZOLIN SODIUM-DEXTROSE 2-3 GM-%(50ML) IV SOLR
INTRAVENOUS | Status: DC | PRN
  Administered 2020-12-23: 2 g via INTRAVENOUS

## 2020-12-23 MED ORDER — ACETAMINOPHEN 160 MG/5ML PO SOLN: 650.0000 mg | ORAL | Status: DC | PRN

## 2020-12-23 MED ORDER — LIDOCAINE 2% (20 MG/ML) 5 ML SYRINGE
INTRAMUSCULAR | Status: DC | PRN
  Administered 2020-12-23: 100 mg via INTRAVENOUS

## 2020-12-23 MED ORDER — DOCUSATE SODIUM 50 MG/5ML PO LIQD
100.0000 mg | Freq: Two times a day (BID) | ORAL | Status: DC
  Filled 2020-12-23: qty 10

## 2020-12-23 MED ORDER — IOHEXOL 240 MG/ML SOLN
INTRAMUSCULAR | Status: AC
  Filled 2020-12-23: qty 200

## 2020-12-23 NOTE — Progress Notes (Signed)
eLink Physician-Brief Progress Note Patient Name: Cesar [name] DOB: 1929-02-17 MRN: [id number]   Date of Service  12/21/2020  HPI/Events of Note  15 M history of afib, severe AS, seizure disorder, dyslipidemia, CKD, SDH s/p burrhole discharged to rehab 4 days prior coming back in for fever and increased confusion. UTI on workup. Also with acute LE DVT. MRI with IC occlusion and underwent thrombectomy.  eICU Interventions  Kept intubated and transferred to ICU AC 18/600/50%/5 PEEP O2 sat 96% On Cleviprex drip May need IVC filter     Intervention Category Major Interventions: Respiratory failure - evaluation and management;Electrolyte abnormality - evaluation and management;Hypertension - evaluation and management Evaluation Type: New Patient Evaluation  Judd Lien 01/02/2021, 8:35 PM

## 2020-12-23 NOTE — Progress Notes (Signed)
PT Cancellation Note  Patient Details Name: Cesar [name] MRN: [id number] DOB: [birth year]   Cancelled Treatment:    Reason Eval/Treat Not Completed: Medical issues which prohibited therapy.  Pt was newly noted to have dense L hemiparesis, strong R side gaze preference and cannot move L side at all, easily moves R side.  Nursing has been in contact with MD, will let neuro see first and re-attempt.     Ramond Dial 12/17/2020, 12:57 PM   Mee Hives, PT MS Acute Rehab Dept. Number: [place] and [place]

## 2020-12-23 NOTE — H&P (Signed)
History and Physical    Cesar [name] FXT:[id number] DOB: 08/20/1929 DOA: 12/03/2020  PCP: Orpah Melter, MD Patient coming from: Home  Chief Complaint: Weakness, fall  HPI: Cesar [name] is a 87 y.o. male with medical history significant of permanent A. fib, CVA, seizure disorder, history of DVT, hyperlipidemia, CKD stage III, severe aortic stenosis status post TAVR in January 2022, BPH, OSA not on CPAP, ascending thoracic aortic aneurysm followed by Dr. [name], nonobstructive CAD.  He presented on 11/19/2020 after increasing weakness with frequent falls and altered mental status.  He was found to have a large left-sided chronic subdural hematoma with loculations and significant mass-effect.  He underwent bur hole drainage of the subdural hematoma with a subdural drain placement on 11/21/2020.  Drain was removed on postop day 2.  His home Eliquis is being held for 6 weeks postop but he was cleared to begin subcutaneous heparin for DVT prophylaxis on 11/25/2020.  He was admitted to inpatient rehab on 2/28 and discharged on 3/17.  He presented to the ED today for evaluation of weakness and fever.  Son called EMS as patient was not able to hold himself up and slid from the bed onto the floor due to generalized weakness.  He had a fever and was given Tylenol prior to EMS arrival.  His temperature was 101.9 F with EMS.  He was initially satting 95% on room air but desaturated down to 90% in route, blood pressure was stable, tachycardic.  Patient is not able to give any history as he is confused and his speech is incomprehensible.  Per son, he was doing well after being discharged from rehab but became weak today and slid down from the bed to the floor and family had to help him get up.   ED Course: Tachycardic and tachypneic.  Blood pressure soft.  Not hypoxic.  Labs showing WBC 15.3, hemoglobin 11.7 (stable compared to recent labs), platelet count 162K.  Sodium 139, potassium 4.5, chloride 111, bicarb  21, BUN 28, creatinine 1.4 (stable), glucose 124.  LFTs normal.  Lactic acid 2.0.  Blood culture x2 pending.  Valproic acid level 44.  SARS-CoV-2 PCR test negative.  Influenza panel negative.  UA with positive nitrite, large amount of leukocytes, greater than 50 RBCs, greater than 50 WBCs, and rare bacteria.  Chest x-ray showing no active disease.  CT head showing extra-axial blood products across the left frontal convexity which appear decreased in size from prior exam and showing resolution of pneumocephaly seen on prior exam; no new sites of hemorrhage. Patient was given Tylenol, cefepime, vancomycin, and 3 L LR boluses.  Review of Systems:  All systems reviewed and apart from history of presenting illness, are negative.  Past Medical History:  Diagnosis Date  . AAA (abdominal aortic aneurysm) ([place])   . CKD (chronic kidney disease), stage III (HCC)    baseline Cr 1.4-1.5 in 2016-2017 by PCP)  . CVA (cerebral infarction)   . Depression   . DVT (deep venous thrombosis) ([place]) 2006  . H/O blood clots   . Hematuria   . HOH (hard of hearing)   . Hypercholesterolemia   . OSA (obstructive sleep apnea)    intol to CPAP  . Permanent atrial fibrillation ([place])   . Prostate enlargement   . Pulmonary nodules   . 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 Buena Irish and Dr. Roxy Manns  . Thoracic  ascending aortic aneurysm ([place])    4.5cm in 11/2018, stable since 2016  . TIA (transient ischemic attack)     Past Surgical History:  Procedure Laterality Date  . BURR HOLE Left 11/21/2020   Procedure: LEFT Side BURR HOLES,;  Surgeon: [name], MD;  Location: [facility];  Service: Neurosurgery;  Laterality: Left;  . EMBOLECTOMY Left 07/02/2016   Procedure: EMBOLECTOMY LEFT FEMORAL ARTERY;  Surgeon: [name], MD;  Location: [facility];  Service: Vascular;  Laterality: Left;  Marland Kitchen MESENTERIC ARTERY BYPASS N/A 08/06/2019   Procedure:  SUPERIOR MESENTERIC ARTERY THROMBECTOMY;  Surgeon: [name], MD;  Location: [facility];  Service: Vascular;  Laterality: N/A;  . NM [place]  08/24/2002   markedly positive,subtle lateral ischemia  . PATCH ANGIOPLASTY  08/06/2019   Procedure: Patch Angioplasty of superior messenteric artery with RIGHT femoral vein;  Surgeon: [name], MD;  Location: [facility];  Service: Vascular;;  . PROSTATE SURGERY    . RIGHT HEART CATH AND CORONARY ANGIOGRAPHY N/A 10/21/2020   Procedure: RIGHT HEART CATH AND CORONARY ANGIOGRAPHY;  Surgeon: [name], MD;  Location: [facility];  Service: Cardiovascular;  Laterality: N/A;  . TEE WITHOUT CARDIOVERSION N/A 10/22/2020   Procedure: TRANSESOPHAGEAL ECHOCARDIOGRAM (TEE);  Surgeon: [name], MD;  Location: [facility];  Service: Open Heart Surgery;  Laterality: N/A;  . TRANSCATHETER AORTIC VALVE REPLACEMENT, TRANSFEMORAL Right 10/22/2020   Procedure: TRANSCATHETER AORTIC VALVE REPLACEMENT, TRANSFEMORAL;  Surgeon: [name], MD;  Location: [facility];  Service: Open Heart Surgery;  Laterality: Right;  . US ECHOCARDIOGRAPHY  01/06/2012   mod. AOV ca+,mild to mod. AS,mod mostly posterior MAC-mild MR     reports that he quit smoking about 68 years ago. His smoking use included cigarettes. He has a 10.00 pack-year smoking history. He has never used smokeless tobacco. He reports previous alcohol use. He reports that he does not use drugs.  Allergies  Allergen Reactions  . Flomax [Tamsulosin Hcl] Other (See Comments)    Not able to urinate  . Levetiracetam Other (See Comments)    Unknown reaction    Family History  Problem Relation Age of Onset  . Heart failure Mother   . Diabetes Father   . Stroke Father     Prior to Admission medications   Medication Sig Start Date End Date Taking? Authorizing Provider  acetaminophen (TYLENOL) 325 MG tablet Take 2 tablets (650 mg total) by mouth every 6 (six) hours  as needed for mild pain (or Fever >/= 101). 12/18/20  Yes Angiulli, Lavon Paganini, PA-C  aspirin EC 81 MG tablet Take 81 mg by mouth daily. Swallow whole.   Yes [provider]  atorvastatin (LIPITOR) 80 MG tablet Take 1 tablet (80 mg total) by mouth daily. 12/18/20  Yes Angiulli, Lavon Paganini, PA-C  b complex vitamins capsule Take 1 capsule by mouth daily.   Yes [provider]  divalproex (DEPAKOTE ER) 500 MG 24 hr tablet Take 2 tablets (1,000 mg total) by mouth at bedtime. 12/18/20  Yes Angiulli, Lavon Paganini, PA-C  docusate sodium (COLACE) 100 MG capsule Take 1 capsule (100 mg total) by mouth 2 (two) times daily. 12/18/20  Yes Angiulli, Lavon Paganini, PA-C  finasteride (PROSCAR) 5 MG tablet Take 1 tablet (5 mg total) by mouth daily. 12/18/20  Yes Angiulli, Lavon Paganini, PA-C  pantoprazole (PROTONIX) 40 MG tablet Take 40 mg by mouth every evening. 12/18/20  Yes [provider]  Physical Exam: Vitals:   12/14/2020 0415 12/20/2020 0421 12/16/2020 0430 12/10/2020 0443  BP: (!) 90/51  120/67   Pulse: (!) 111 (!) 109 (!) 113   Resp: (!) 21 (!) 25 18   Temp:  99.7 F (37.6 C)    TempSrc:  Rectal    SpO2: 97% 97% 98%   Weight:    86.2 kg  Height:    _0  (1.854 m)    Physical Exam Constitutional:      General: He is not in acute distress. HENT:     Head: Normocephalic and atraumatic.     Mouth/Throat:     Mouth: Mucous membranes are dry.  Eyes:     Conjunctiva/sclera: Conjunctivae normal.  Cardiovascular:     Rate and Rhythm: Tachycardia present. Rhythm irregular.     Pulses: Normal pulses.     Comments: Slightly tachycardic Pulmonary:     Breath sounds: No wheezing or rales.  Abdominal:     General: Bowel sounds are normal.     Palpations: Abdomen is soft.     Tenderness: There is no abdominal tenderness. There is no guarding.  Musculoskeletal:     Cervical back: Normal range of motion and neck supple.     Comments: Trace bilateral lower extremity edema  Skin:    General: Skin  is warm and dry.  Neurological:     Mental Status: He is alert.     Comments: Somnolent but arousable Confused and disoriented Speech incomprehensible Not following commands appropriately     Labs on Admission: I have personally reviewed following labs and imaging studies  CBC: Recent Labs  Lab 12/31/2020 0218  WBC 15.3*  NEUTROABS 13.1*  HGB 11.7*  HCT 36.7*  MCV 101.9*  PLT 782   Basic Metabolic Panel: Recent Labs  Lab 12/17/20 0439 12/18/20 0624 12/10/2020 0218  NA 138 138 139  K 4.3 4.8 4.5  CL 105 105 111  CO2 26 27 21*  GLUCOSE 100* 91 124*  BUN 26* 26* 28*  CREATININE 1.52* 1.48* 1.49*  CALCIUM 8.5* 8.6* 8.4*   GFR: Estimated Creatinine Clearance: 38 mL/min (A) (by C-G formula based on SCr of 1.49 mg/dL (H)). Liver Function Tests: Recent Labs  Lab 12/09/2020 0218  AST 27  ALT 17  ALKPHOS 42  BILITOT 0.8  PROT 5.9*  ALBUMIN 2.6*   No results for input(s): LIPASE, AMYLASE in the last 168 hours. No results for input(s): AMMONIA in the last 168 hours. Coagulation Profile: No results for input(s): INR, PROTIME in the last 168 hours. Cardiac Enzymes: No results for input(s): CKTOTAL, CKMB, CKMBINDEX, TROPONINI in the last 168 hours. BNP (last 3 results) No results for input(s): PROBNP in the last 8760 hours. HbA1C: No results for input(s): HGBA1C in the last 72 hours. CBG: Recent Labs  Lab 12/19/20 0609 12/05/2020 0713  GLUCAP 98 107*   Lipid Profile: No results for input(s): CHOL, HDL, LDLCALC, TRIG, CHOLHDL, LDLDIRECT in the last 72 hours. Thyroid Function Tests: No results for input(s): TSH, T4TOTAL, FREET4, T3FREE, THYROIDAB in the last 72 hours. Anemia Panel: No results for input(s): VITAMINB12, FOLATE, FERRITIN, TIBC, IRON, RETICCTPCT in the last 72 hours. Urine analysis:    Component Value Date/Time   COLORURINE YELLOW 12/07/2020 0433   APPEARANCEUR HAZY (A) 12/16/2020 0433   LABSPEC 1.014 12/06/2020 [place] 5.0 12/11/2020 0433    GLUCOSEU NEGATIVE 12/13/2020 0433   HGBUR LARGE (A) 12/19/2020 [place] NEGATIVE 12/06/2020 9562  KETONESUR NEGATIVE 12/19/2020 0433   PROTEINUR 30 (A) 01/02/2021 0433   UROBILINOGEN 2.0 (H) 01/01/2012 1328   NITRITE POSITIVE (A) 12/24/2020 0433   LEUKOCYTESUR LARGE (A) 12/27/2020 0433    Radiological Exams on Admission: CT Head Wo Contrast  Result Date: 12/18/2020 CLINICAL DATA:  Fall from bed, recent subdural hematoma EXAM: CT HEAD WITHOUT CONTRAST TECHNIQUE: Contiguous axial images were obtained from the base of the skull through the vertex without intravenous contrast. COMPARISON:  CT 11/30/2020, MR 11/26/2020 FINDINGS: Brain: Redemonstration of the mixed age extra-axial blood products across the left frontal convexity which appear decreased in size from the prior exam now measuring up to a maximal thickness of 9 mm, previously 15 mm at a similar level. Resolution of the pneumocephaly seen on the comparison exam. No new sites of hemorrhage. Overlying craniotomy defects are noted with postsurgical change. Stable regions of encephalomalacia in the bilateral frontal lobes. Additional punctate foci of hypoattenuation of bilateral basal ganglia likely reflecting sequela of prior lacunar type infarcts. Findings on a background of diffuse chronic microvascular angiopathy and parenchymal volume loss. No significant residual mass effect or midline shift. Vascular: Atherosclerotic calcification of the carotid siphons and intradural vertebral arteries. No hyperdense vessel. Skull: Postsurgical changes on the left frontal scalp with craniotomy defects likely for evacuation of the previously seen subdural hematoma. Some slightly increased left supraorbital/frontal scalp swelling may be related to new contusive change, correlate for point tenderness. No other sites of significant scalp swelling or hematoma. No calvarial fracture or other acute osseous injury. Sinuses/Orbits: Minimal mural thickening in  the paranasal sinuses. No layering air-fluid levels or pneumatized secretions. Bilateral lens extractions and right scleral buckle. Other: None. IMPRESSION: 1. Redemonstration of the attenuation extra-axial blood products across the left frontal convexity which appear decreased in size from the prior exam now measuring up to a maximal thickness of 9 mm, previously 15 mm at a similar level. Resolution of the pneumocephaly seen on the comparison exam. No new sites of hemorrhage. 2. Some slightly increased left supraorbital/frontal scalp swelling may be related to new contusive change, correlate for point tenderness. No calvarial fracture. 3. Stable regions of encephalomalacia in the bilateral frontal lobes and remote lacunar infarcts in the basal ganglia. 4. Background of diffuse chronic microvascular angiopathy and parenchymal volume loss. 5. Intracranial atherosclerosis. Electronically Signed   By: Lovena Le M.D.   On: 12/08/2020 02:40   DG Chest Port 1 View  Result Date: 12/21/2020 CLINICAL DATA:  87 year old male with sepsis. EXAM: PORTABLE CHEST 1 VIEW COMPARISON:  Chest radiograph dated 11/27/2020. FINDINGS: No focal consolidation, pleural effusion, or pneumothorax. Mild cardiomegaly. Atherosclerotic calcification of the aorta. Aortic valve repair and calcification of the mitral annulus. No acute osseous pathology. IMPRESSION: 1. No acute cardiopulmonary process. 2. Mild cardiomegaly. Electronically Signed   By: Anner Crete M.D.   On: 12/09/2020 02:43    EKG: Independently reviewed.  A. fib, no significant change since prior tracing.  Assessment/Plan Principal Problem:   UTI (urinary tract infection) Active Problems:   Permanent atrial fibrillation (HCC)   CKD (chronic kidney disease), stage III (HCC)   Severe sepsis (HCC)   Acute metabolic encephalopathy   Severe sepsis secondary to UTI: Meets criteria for severe sepsis given fever, tachycardia, tachypnea, leukocytosis, and mild lactic  acidosis. UA with positive nitrite, large amount of leukocytes, greater than 50 RBCs, greater than 50 WBCs, and rare bacteria.  Recent urine culture done 12/06/2020 was growing E. Coli. -Continue cefepime.  Patient was given  30cc/Kg fluid boluses per sepsis protocol.  Blood pressure now improved.  Give Tylenol as needed for fevers.  Urine culture pending.  Blood culture x2 pending.  Trend lactate and WBC count.  Acute metabolic encephalopathy: Patient is somnolent but arousable.  Confused, disoriented, and his speech is incomprehensible.  Not following commands appropriately.  Change in mental status likely due to UTI.  CT head showing extra-axial blood products across the left frontal convexity which appear decreased in size from prior exam and showing resolution of pneumocephaly seen on prior exam; no new sites of hemorrhage.  -Continue management of UTI as mentioned above.  Stat ABG.  Check TSH, B12, and ammonia levels.  EEG ordered.  Keep n.p.o. at this time, aspiration precautions.  Permanent A. Fib: He underwent bur hole drainage for subdural hematoma on 11/21/2020.  His home Eliquis is being held for 6 weeks postop.  -Hold Eliquis.   Seizure disorder: Depakote level slightly subtherapeutic at 44. -Avoiding p.o. meds at this time given his altered mental status/risk of aspiration.  Discussed with pharmacy -recommended giving IV valproic acid 1000 mg loading dose, then continue 500 mg twice daily.  EEG ordered given altered mental status.  CKD stage III: Stable.  Creatinine 1.4, at baseline. -Continue to monitor renal function.  Physical deconditioning -PT/OT eval, fall precautions  Hyperlipidemia BPH GERD -Resume home meds when patient is more awake.  DVT prophylaxis: He was cleared to begin subcutaneous heparin for DVT prophylaxis on 11/25/2020.  However, at rehab, he became severely thrombocytopenic and subcutaneous heparin was stopped on 3/4.  I have ordered bilateral lower extremity  Dopplers, if negative for DVT, order SCDs for DVT prophylaxis. Code Status: Full code at this time. Discussed with the patient's son who feels that the patient should be DNR given his age and medical comorbidities, however, he wants to get other family members involved in decision making. States he will speak to his sister and mother first and then decide whether patient's code status should be changed to DNR.  Please readdress in a.m. Family Communication: Son updated. Disposition Plan: Status is: Inpatient  Remains inpatient appropriate because:Altered mental status and Inpatient level of care appropriate due to severity of illness   Dispo: The patient is from: Home              Anticipated d/c is to: SNF              Patient currently is not medically stable to d/c.   Difficult to place patient No  Level of care: Level of care: Telemetry Medical   The medical decision making on this patient was of high complexity and the patient is at high risk for clinical deterioration, therefore this is a level 3 visit.  Shela Leff MD [facility]  If 7PM-7AM, please contact night-coverage [email or web address]  12/03/2020, 7:42 AM

## 2020-12-23 NOTE — Progress Notes (Signed)
Patient ID: Cesar [name], male   DOB: 11-08-1928, 87 y.o.   MRN: [id number] RT H male MRS 3 LSW  Last night. New onset confusion And slurred speech and left sided weakness and Rt gaze preference... MRI DWI restriction in the RT cerebral hemisphere and RT carotid occlusion. CTA occluded RT ICA intracranially and extracranially and RT MCA M1. CTP zero core and penumbra of 109 ml. Endovascular treatment D/W spouse and daughter. Procedure,reasons and alternatives reviewed.. Risks of ICH  Of 10 % worsening neuro deficit ,death and inability to revascularize were reviewed. Spouse and family expressed understanding and provided consent to proceed. S.Deveshwar MD

## 2020-12-23 NOTE — Anesthesia Preprocedure Evaluation (Addendum)
Anesthesia Evaluation  Patient identified by MRN, date of birth, ID band Patient confused    Reviewed: Allergy & Precautions, NPO status , Patient's Chart, lab work & pertinent test results  Airway Mallampati: II       Dental  (+) Edentulous Upper, Partial Lower   Pulmonary sleep apnea and Continuous Positive Airway Pressure Ventilation , former smoker,    breath sounds clear to auscultation       Cardiovascular + Peripheral Vascular Disease and + DVT  + dysrhythmias Atrial Fibrillation + Valvular Problems/Murmurs (s/p TAVR)  Rhythm:Irregular Rate:Normal     Neuro/Psych Seizures -,  PSYCHIATRIC DISORDERS Depression S/p BHWO TIACVA, Residual Symptoms    GI/Hepatic Neg liver ROS, GERD  Medicated,  Endo/Other  negative endocrine ROS  Renal/GU Renal disease     Musculoskeletal negative musculoskeletal ROS (+)   Abdominal   Peds  Hematology  (+) Blood dyscrasia, anemia ,   Anesthesia Other Findings Day of surgery medications reviewed with the patient.  Reproductive/Obstetrics                            Anesthesia Physical Anesthesia Plan  ASA: IV and emergent  Anesthesia Plan: General   Post-op Pain Management:    Induction: Intravenous, Rapid sequence and Cricoid pressure planned  PONV Risk Score and Plan: 2 and Dexamethasone and Ondansetron  Airway Management Planned: Oral ETT  Additional Equipment: Arterial line  Intra-op Plan:   Post-operative Plan: Post-operative intubation/ventilation  Informed Consent: I have reviewed the patients History and Physical, chart, labs and discussed the procedure including the risks, benefits and alternatives for the proposed anesthesia with the patient or authorized representative who has indicated his/her understanding and acceptance.     Only emergency history available and History available from chart only  Plan Discussed with:  CRNA  Anesthesia Plan Comments:         Anesthesia Quick Evaluation

## 2020-12-23 NOTE — Procedures (Signed)
S/P Rt common carotid arteriogram  RT CFA approach. S/P complete revascularization of occluded ICA to the terminus and RT MCA with x 1 pass with 6.5 mm x 45 mm embotrap retriever and pro aspiration achieving a TICI 3 revascularization. Post CT brain No ICH or mass effect Stable Lt cerebral convexity  SDH. 61F angioseal for hemostasis in the RT groin puncture site. Patient left intubated to prevent aspiration. Poor control of secretions  Due to med condition. S.Lee Kalt MD

## 2020-12-23 NOTE — ED Triage Notes (Addendum)
Pt BIB EMS from home for c/o generalized weakness. Pt normally able to ambulate self, asked family member for help out of bed this morning and slid onto floor from the bed. Elevated temp noted at home, pt took tylenol 0115 for this before EMS arrival. A&Ox4 on arrival.   Pt had surgery 4 weeks ago for a brain bleed secondary to fall, normally on eliquis for a fib, but not currently due to recent surgery.   EMS vitals: Temp 101.8 HR 120-150 BP 160/86 90% RA RR 36  CBG 186

## 2020-12-23 NOTE — Progress Notes (Addendum)
Patient seen and examined this morning, admitted overnight, H&P reviewed and agree with the assessment and plan  87 year old male with history of permanent A. fib, CVA, seizure disorder, history of DVT, hyperlipidemia, chronic kidney disease stage III, severe AS status post TAVR in January 2022, OSA not on CPAP, nonobstructive CAD comes into the hospital with confusion.  He was recently hospitalized in February 2022 with altered mental status, he was found to have a large left-sided chronic subdural hematoma status post bur hole drainage.  At that time he was taken off anticoagulation for 6 weeks.  But he was cleared to begin subcutaneous heparin for DVT prophylaxis 2/21.  He was admitted to the inpatient rehab 2/28 and discharged on 3/17.  He was home from the few days, was doing well up until last night when he started becoming more confused and felt "hot" to the family.  Confusion persisted and eventually EMS was called.  Patient was found to be extremely weak, febrile to 101.9, tachycardic.  He was brought to the ED.  Overnight in the ED he experienced worsening slurred speech around 5:00 this morning and appeared to have increased confusion.  Principal problem Severe sepsis due to UTI-patient started on cefepime, continue, continue IV fluids.  Blood pressure is better.  Closely monitor  Active problems Acute DVT bilateral LE -reported on lower extremity ultrasound today.  Cannot anticoagulate due to recent head bleed.  Left-sided weakness-developed symptoms overnight, patient is unable to move his left arm and does not appear to withdrawal to pain.  Consulted neurology, appreciate input.  An MRI showed right-sided stroke, also possible occlusion of the internal carotid artery on the right.  He will be taken to IR emergently, will be transferred to the neurology service and sent to the ICU.  Consulted critical care also.  Acute metabolic encephalopathy-likely due to UTI however concerning the head  worsening dysarthria overnight.  Repeat CT scan of the head unremarkable.  Will obtain an MRI  Permanent A. fib-not on anticoagulation due to subdural hematoma.  Bur hole drainage was on 2/17.  Eliquis appears to be on hold for 6 weeks postop  Seizure disorder-Depakote slightly subtherapeutic at 44.  Was given IV valproic acid 1 g loading dose now continuing 500 mg twice daily.  EEG has been ordered and is pending  CKD stage IIIa-creatinine 1.4, at baseline  Physical deconditioning-PT consult  Hyperlipidemia BPH GERD -Resume home medications when able to take p.o.  Faraaz Wolin M. Cruzita Lederer, MD, PhD [facility]  Between 7 am - 7 pm you can contact me via [place] or [place].  I am not available 7 pm - 7 am, please contact night coverage MD/APP via Amion

## 2020-12-23 NOTE — Progress Notes (Signed)
Pt being followed by [facility] for Code sepsis.

## 2020-12-23 NOTE — Consult Note (Signed)
NAME:  Cesar [name], MRN:  [id number], DOB:  23-Oct-1928, LOS: 0 ADMISSION DATE:  12/22/2020, CONSULTATION DATE:  .12/22/2020 REFERRING MD:  Cheral Marker, CHIEF COMPLAINT:  Weakness   History of Present Illness:  87 y.o. M with PMH significant for AAA, stage III CKD, OSA, seizures, OSA, severe AS s/p TAVR 10/22/20, permanent Afib who has recently admitted for TAVR and discharged on [place] and Eliquis, re-admitted 2/15 with large, chronic subdural hematoma after falls and underwent bur hole drainage with drain placement.  His anti-coagulation was held and he was discharged to rehab and then to home on 3/17.      He then presented to the ED on 3/21 with generalized weakness and fever and was found to have UTI and bilateral DVT's.  His initial head CT did not show acute changes, but throughout ED course he became increasingly confused and had slurred speech.  CTA showed occluded R ICA, so was taken emergently to IR and then admitted to the ICU.   Pertinent  Medical History   has a past medical history of AAA (abdominal aortic aneurysm) ([place]), CKD (chronic kidney disease), stage III ([place]), CVA (cerebral infarction), Depression, DVT (deep venous thrombosis) ([place]) (2006), H/O blood clots, Hematuria, HOH (hard of hearing), Hypercholesterolemia, OSA (obstructive sleep apnea), Permanent atrial fibrillation ([place]), Prostate enlargement, Pulmonary nodules, S/P TAVR (transcatheter aortic valve replacement) (10/22/2020), Thoracic ascending aortic aneurysm ([place]), and TIA (transient ischemic attack).   Significant Hospital Events: Including procedures, antibiotic start and stop dates in addition to other pertinent events   . Admit to hospitalists, worsening neuro status and found to have occluded R ICA, taken to IR and then ICU, PCCM consult  Interim History / Subjective:  Pt transferred to ICU in stable condition  Objective   Blood pressure (!) 133/92, pulse (!) 51, temperature 99.7 F (37.6 C), temperature source  Rectal, resp. rate (!) 21, height _0  (1.854 m), weight 86.2 kg, SpO2 98 %.        Intake/Output Summary (Last 24 hours) at 12/17/2020 1921 Last data filed at 12/09/2020 1853 Gross per 24 hour  Intake 826.33 ml  Output -  Net 826.33 ml   Filed Weights   01/01/2021 0443  Weight: 86.2 kg   General:  Elderly M, intubated and sedated HEENT: MM pink/moist, ETT in place, sclera anicteric  Neuro: examined while sedated on propofol, pupils equal and reactive, no gag, not responsive CV: s1s2 rrr, no m/r/g PULM:  Mechanical breath sounds bilaterally, good saturations on minimal vent setting GI: soft, bsx4 active  Extremities: warm/dry, no edema  Skin: no rashes or lesions   Labs/imaging that I havepersonally reviewed   CBC, metabolic panel  Resolved Hospital Problem list     Assessment & Plan:    R ICA occlusion s/p thrombectomy Initially admitted 3/21 for AMS in the setting of recent SDH, increased slurred speech around 0500 3/21 prompting CT Head showing resolving SDH, stable mild mass effect without midline shift; MR Brain with multiple small areas of acute infarct in the R cerebral hemisphere with loss of flow in the R ICA compatible with occlusion; CTA Head/Neck showed R ICA occlusion proximally with moderate/severe stenosis at the origin of the dominant L vertebral artery. S/p IR thrombectomy with complete revascularization of occluded ICA to the terminus and RT MCA x 1 pass. - Neurology following, appreciate assistance, defer anti-platelet and anti-coagulation to neurology team - Frequent neuro checks - SBP goal < 140, currently requiring Cleviprex, no home HTN meds -  Neuroprotective measures: HOB > 30 degrees, normoglycemia, normothermia, electrolytes WNL  Ventilator dependence in the setting of acute stroke/encephalopathy Remains intubated in the setting of AMS/secretions, inability to protect airway - Continue full vent support (6-8cc/kg IBW) - Wean FiO2 for O2 sat > 90% -  Daily WUA/SBT - VAP bundle - Pulmonary hygiene - PAD protocol for sedation: Propofol for goal RASS 0 to -2  Sepsis secondary to UTI Admitted with fever to Tmax 101.9, leukocytosis, lactic acidosis; UA with +nitrite + leuks, > 50 RBCs and rare bacteria. UCx pending, recent culture 12/06/20 +E.coli (pansensitive).  - S/p vanc x 1 - Continue cefepime  - Continue fluid resuscitation - F/u final UCx/BCx -not required pressors  History of left SDH s/p craniotomy  On therapeutic anticoagulation s/p TAVR 10/22/2020 s/p fall with resultant L SDH. Underwent craniotomy with drain placement. Resolving on CT Head/MR Brain 3/21, now 85m, stable  Seizure disorder Known seizure disorder on Depakote; level on admission slightly subtherapeutic at 44. EEG completed 3/21 with findings c/w mild-moderate diffuse encephalopathy with possible areas of focal neuronal dysfunction on R, no clear epileptiform abnormalities. - Continue IV valproic acid (10075mload, then 50042mID)  Severe aortic stenosis s/p TAVR 10/2020 History of severe AS, most recent Echo 11/14/20 (post-TAVR) with LVEF 60-65%; normal LV function, RV moderately enlarged with normal function, LA severely dilated, degenerative mitral valve with mild-mod regurgitation without e/o stenosis, s/p TAVR with trivial aortic regurgitation, no stenosis. - Therapeutic anticoagulation held in the setting of recent SDH with craniotomy - [facility]  Permanent A-fib BLE DVT Previously on Eliquis, which had been held since 11/21/2020 in the setting of SDH with craniotomy with plan for holding x 6 weeks; [facility]. BLE US [place] demonstrated acute DVT of the R posterior tibial and peroneal veins and L peroneal veins. CHAD2S2VASc score 4 (4.8-6.7%). [facility] discussion with family and neurology -consider serial LE dopplers vs IVC filter  CKD Stage III Baseline sCr 1.3-1.5. - Trend  BMP - Monitor I&Os - Avoid nephrotoxic agents as able  Physical deconditioning Recently discharged from acute rehab 12/19/2020. - PT/OT/SLP as clinically appropriate  Hyperlipidemia BPH GERD - Resume home Lipitor, Proscar, Protonix once able to tolerate PO    Best practice (evaluated daily)  Diet:  NPO Pain/Anxiety/Delirium protocol (if indicated): Yes (RASS goal -1,-2) VAP protocol (if indicated): Yes DVT prophylaxis: SCD GI prophylaxis: PPI Glucose control:  SSI Yes Central venous access:  N/A Arterial line:  N/A Foley:  Yes, and it is still needed Mobility:  bed rest  PT consulted: N/A Last date of multidisciplinary goals of care discussion [per primary] Code Status:  full code Disposition: ICU  Labs   CBC: Recent Labs  Lab 12/11/2020 0218 12/09/2020 0732 12/06/2020 1037  WBC 15.3* 15.1*  --   NEUTROABS 13.1*  --   --   HGB 11.7* 10.3* 7.8*  HCT 36.7* 32.0* 23.0*  MCV 101.9* 101.6*  --   PLT 162 147*  --     Basic Metabolic Panel: Recent Labs  Lab 12/17/20 0439 12/18/20 0624 01/01/2021 0218 12/17/2020 0745 12/13/2020 1037  NA 138 138 139 139 141  K 4.3 4.8 4.5 4.5 5.8*  CL 105 105 111 109  --   CO2 26 27 21* 23  --   GLUCOSE 100* 91 124* 112*  --   BUN 26* 26* 28* 23  --   CREATININE 1.52* 1.48* 1.49* 1.44*  --  CALCIUM 8.5* 8.6* 8.4* 8.4*  --    GFR: Estimated Creatinine Clearance: 39.3 mL/min (A) (by C-G formula based on SCr of 1.44 mg/dL (H)). Recent Labs  Lab 12/08/2020 0211 12/13/2020 0218 12/04/2020 0732  WBC  --  15.3* 15.1*  LATICACIDVEN 2.0*  --  1.9    Liver Function Tests: Recent Labs  Lab 12/29/2020 0218  AST 27  ALT 17  ALKPHOS 42  BILITOT 0.8  PROT 5.9*  ALBUMIN 2.6*   No results for input(s): LIPASE, AMYLASE in the last 168 hours. Recent Labs  Lab 12/20/2020 0732  AMMONIA 14    ABG    Component Value Date/Time   PHART 7.399 10/21/2020 0804   PCO2ART 39.3 10/21/2020 0804   PO2ART 144 (H) 10/21/2020 0804   HCO3 20.5  12/09/2020 1037   TCO2 21 (L) 12/18/2020 1037   ACIDBASEDEF 3.0 (H) 12/10/2020 1037   O2SAT 69.0 12/05/2020 1037     Coagulation Profile: No results for input(s): INR, PROTIME in the last 168 hours.  Cardiac Enzymes: No results for input(s): CKTOTAL, CKMB, CKMBINDEX, TROPONINI in the last 168 hours.  HbA1C: Hgb A1c MFr Bld  Date/Time Value Ref Range Status  10/22/2020 04:56 AM 5.1 4.8 - 5.6 % Final    Comment:    (NOTE) Pre diabetes:          5.7%-6.4%  Diabetes:              >6.4%  Glycemic control for   <7.0% adults with diabetes   03/16/2019 04:46 AM 5.3 4.8 - 5.6 % Final    Comment:    (NOTE) Pre diabetes:          5.7%-6.4% Diabetes:              >6.4% Glycemic control for   <7.0% adults with diabetes     CBG: Recent Labs  Lab 12/19/20 0609 12/11/2020 0713  GLUCAP 98 107*    Review of Systems:   Unable to obtain secondary to mental status.   Past Medical History:  He,  has a past medical history of AAA (abdominal aortic aneurysm) ([place]), CKD (chronic kidney disease), stage III ([place]), CVA (cerebral infarction), Depression, DVT (deep venous thrombosis) ([place]) (2006), H/O blood clots, Hematuria, HOH (hard of hearing), Hypercholesterolemia, OSA (obstructive sleep apnea), Permanent atrial fibrillation ([place]), Prostate enlargement, Pulmonary nodules, S/P TAVR (transcatheter aortic valve replacement) (10/22/2020), Thoracic ascending aortic aneurysm ([place]), and TIA (transient ischemic attack).   Surgical History:   Past Surgical History:  Procedure Laterality Date  . BURR HOLE Left 11/21/2020   Procedure: LEFT Side BURR HOLES,;  Surgeon: [name], MD;  Location: [facility];  Service: Neurosurgery;  Laterality: Left;  . EMBOLECTOMY Left 07/02/2016   Procedure: EMBOLECTOMY LEFT FEMORAL ARTERY;  Surgeon: [name], MD;  Location: [facility];  Service: Vascular;  Laterality: Left;  Marland Kitchen MESENTERIC ARTERY BYPASS N/A 08/06/2019   Procedure: SUPERIOR MESENTERIC ARTERY  THROMBECTOMY;  Surgeon: [name], MD;  Location: [facility];  Service: Vascular;  Laterality: N/A;  . NM [place]  08/24/2002   markedly positive,subtle lateral ischemia  . PATCH ANGIOPLASTY  08/06/2019   Procedure: Patch Angioplasty of superior messenteric artery with RIGHT femoral vein;  Surgeon: [name], MD;  Location: [facility];  Service: Vascular;;  . PROSTATE SURGERY    . RIGHT HEART CATH AND CORONARY ANGIOGRAPHY N/A 10/21/2020   Procedure: RIGHT HEART CATH AND CORONARY ANGIOGRAPHY;  Surgeon: [name], MD;  Location: [facility];  Service: Cardiovascular;  Laterality: N/A;  . TEE WITHOUT CARDIOVERSION N/A 10/22/2020   Procedure: TRANSESOPHAGEAL ECHOCARDIOGRAM (TEE);  Surgeon: [name], MD;  Location: [facility];  Service: Open Heart Surgery;  Laterality: N/A;  . TRANSCATHETER AORTIC VALVE REPLACEMENT, TRANSFEMORAL Right 10/22/2020   Procedure: TRANSCATHETER AORTIC VALVE REPLACEMENT, TRANSFEMORAL;  Surgeon: [name], MD;  Location: [facility];  Service: Open Heart Surgery;  Laterality: Right;  . US ECHOCARDIOGRAPHY  01/06/2012   mod. AOV ca+,mild to mod. AS,mod mostly posterior MAC-mild MR     Social History:   reports that he quit smoking about 68 years ago. His smoking use included cigarettes. He has a 10.00 pack-year smoking history. He has never used smokeless tobacco. He reports previous alcohol use. He reports that he does not use drugs.   Family History:  His family history includes Diabetes in his father; Heart failure in his mother; Stroke in his father.   Allergies Allergies  Allergen Reactions  . Flomax [Tamsulosin Hcl] Other (See Comments)    Not able to urinate  . Levetiracetam Other (See Comments)    Unknown reaction     Home Medications  Prior to Admission medications   Medication Sig Start Date End Date Taking? Authorizing Provider  acetaminophen (TYLENOL) 325 MG tablet Take 2 tablets (650 mg total)  by mouth every 6 (six) hours as needed for mild pain (or Fever >/= 101). 12/18/20  Yes Angiulli, Lavon Paganini, PA-C  aspirin EC 81 MG tablet Take 81 mg by mouth daily. Swallow whole.   Yes [provider]  atorvastatin (LIPITOR) 80 MG tablet Take 1 tablet (80 mg total) by mouth daily. 12/18/20  Yes Angiulli, Lavon Paganini, PA-C  b complex vitamins capsule Take 1 capsule by mouth daily.   Yes [provider]  divalproex (DEPAKOTE ER) 500 MG 24 hr tablet Take 2 tablets (1,000 mg total) by mouth at bedtime. 12/18/20  Yes Angiulli, Lavon Paganini, PA-C  docusate sodium (COLACE) 100 MG capsule Take 1 capsule (100 mg total) by mouth 2 (two) times daily. 12/18/20  Yes Angiulli, Lavon Paganini, PA-C  finasteride (PROSCAR) 5 MG tablet Take 1 tablet (5 mg total) by mouth daily. 12/18/20  Yes Angiulli, Lavon Paganini, PA-C  pantoprazole (PROTONIX) 40 MG tablet Take 40 mg by mouth every evening. 12/18/20  Yes [provider]     Critical care time: 45 minutes     CRITICAL CARE Performed by: Otilio Carpen Gleason   Total critical care time: 45 minutes  Critical care time was exclusive of separately billable procedures and treating other patients.  Critical care was necessary to treat or prevent imminent or life-threatening deterioration.  Critical care was time spent personally by me on the following activities: development of treatment plan with patient and/or surrogate as well as nursing, discussions with consultants, evaluation of patient's response to treatment, examination of patient, obtaining history from patient or surrogate, ordering and performing treatments and interventions, ordering and review of laboratory studies, ordering and review of radiographic studies, pulse oximetry and re-evaluation of patient's condition.  Otilio Carpen Gleason, PA-C [facility] See Amion for pager If no response to pager , please call [phone] until 7pm After 7:00 pm call Elink  [phone]

## 2020-12-23 NOTE — ED Provider Notes (Signed)
[facility] EMERGENCY DEPARTMENT Provider Note   CSN: [id number] Arrival date & time: 12/15/2020  0203     History Chief Complaint  Patient presents with  . Code Sepsis    Cesar [name] is a 87 y.o. male.  The history is provided by the patient and medical records.   87 year old male with history of AAA, chronic kidney disease, depression, permanent A. fib not currently on anticoagulation due to recent SDH, presenting to the ED as a code sepsis.  Patient recently discharged from rehab facility on 12/19/20 presenting to the ED for generalized weakness and fever.  Son called EMS as patient was not able to hold himself up and slid from bed into floor due to generalized weakness.  Reportedly fever just developed today, patient did taken tylenol prior to EMS arrival.  Patient was 101.31F temporal for EMS.  He was initially 95% on RA but desaturated down to 90% en route, BP was stable, tachycardic.  Unsure of recent URI symptoms.  Patient denies any current pain/discomfort.  Patient's eliquis being held for 6 wks post op from 11/21/20 but he did have lovenox ppx resumed 11/25/20.  Past Medical History:  Diagnosis Date  . AAA (abdominal aortic aneurysm) ([place])   . CKD (chronic kidney disease), stage III (HCC)    baseline Cr 1.4-1.5 in 2016-2017 by PCP)  . CVA (cerebral infarction)   . Depression   . DVT (deep venous thrombosis) ([place]) 2006  . H/O blood clots   . Hematuria   . HOH (hard of hearing)   . Hypercholesterolemia   . OSA (obstructive sleep apnea)    intol to CPAP  . Permanent atrial fibrillation ([place])   . Prostate enlargement   . Pulmonary nodules   . 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 Buena Irish and Dr. Roxy Manns  . Thoracic ascending aortic aneurysm ([place])    4.5cm in 11/2018, stable since 2016  . TIA (transient ischemic attack)     Patient Active Problem List   Diagnosis Date  Noted  . Dysphagia   . Hematuria   . Acute lower UTI   . Traumatic subdural hematoma ([place]) 12/02/2020  . Aphasia 11/26/2020  . Right sided weakness 11/26/2020  . Seizures ([place])   . History of CVA (cerebrovascular accident)   . History of DVT (deep vein thrombosis)   . OSA (obstructive sleep apnea)   . Thrombocytopenia ([place])   . Frequent falls 11/20/2020  . Subdural bleeding ([place]) 11/20/2020  . SDH (subdural hematoma) ([place]) 11/19/2020  . Pressure injury of skin 10/23/2020  . 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 Severe aortic stenosis   . Syncope 10/16/2020  . Gait abnormality 04/15/2020  . Confusion 11/16/2019  . Cerebrovascular accident (CVA) ([place]) 11/16/2019  . 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 Acute ischemic stroke ([place]) 03/15/2019  . PAD (peripheral artery disease) ([place]) 07/23/2018  . Ischemia of lower extremity 07/12/2016  . Acute blood loss anemia 07/12/2016  . Depression 07/12/2016  . BPH (benign prostatic hyperplasia) 07/12/2016  . AAA (abdominal aortic aneurysm) ([place])   . Atrial fibrillation ([place])   . Embolism ([place]) 07/02/2016  . CKD (chronic kidney disease), stage III ([place])   . Claudication ([place]) 06/04/2016  . Long term current use of anticoagulant 06/04/2016  . Abnormal ECG 06/04/2016  . Thoracic ascending aortic aneurysm (  [place]) 04/22/2015  . Permanent atrial fibrillation ([place]) 05/11/2013  . Mild aortic stenosis 05/11/2013  . Orthostatic hypotension 05/11/2013  . Hypercholesterolemia 05/11/2013    Past Surgical History:  Procedure Laterality Date  . BURR HOLE Left 11/21/2020   Procedure: LEFT Side BURR HOLES,;  Surgeon: [name], MD;  Location: [facility];  Service: Neurosurgery;  Laterality: Left;  . EMBOLECTOMY Left 07/02/2016   Procedure: EMBOLECTOMY LEFT FEMORAL ARTERY;  Surgeon: [name], MD;  Location: [facility];  Service: Vascular;  Laterality: Left;   Marland Kitchen MESENTERIC ARTERY BYPASS N/A 08/06/2019   Procedure: SUPERIOR MESENTERIC ARTERY THROMBECTOMY;  Surgeon: [name], MD;  Location: [facility];  Service: Vascular;  Laterality: N/A;  . NM [place]  08/24/2002   markedly positive,subtle lateral ischemia  . PATCH ANGIOPLASTY  08/06/2019   Procedure: Patch Angioplasty of superior messenteric artery with RIGHT femoral vein;  Surgeon: [name], MD;  Location: [facility];  Service: Vascular;;  . PROSTATE SURGERY    . RIGHT HEART CATH AND CORONARY ANGIOGRAPHY N/A 10/21/2020   Procedure: RIGHT HEART CATH AND CORONARY ANGIOGRAPHY;  Surgeon: [name], MD;  Location: [facility] CV LAB;  Service: Cardiovascular;  Laterality: N/A;  . TEE WITHOUT CARDIOVERSION N/A 10/22/2020   Procedure: TRANSESOPHAGEAL ECHOCARDIOGRAM (TEE);  Surgeon: [name], MD;  Location: [facility];  Service: Open Heart Surgery;  Laterality: N/A;  . TRANSCATHETER AORTIC VALVE REPLACEMENT, TRANSFEMORAL Right 10/22/2020   Procedure: TRANSCATHETER AORTIC VALVE REPLACEMENT, TRANSFEMORAL;  Surgeon: [name], MD;  Location: [facility];  Service: Open Heart Surgery;  Laterality: Right;  . US ECHOCARDIOGRAPHY  01/06/2012   mod. AOV ca+,mild to mod. AS,mod mostly posterior MAC-mild MR       Family History  Problem Relation Age of Onset  . Heart failure Mother   . Diabetes Father   . Stroke Father     Social History   Tobacco Use  . Smoking status: Former Smoker    Packs/day: 1.00    Years: 10.00    Pack years: 10.00    Types: Cigarettes    Quit date: 10/05/1952    Years since quitting: 68.2  . Smokeless tobacco: Never Used  Vaping Use  . Vaping Use: Never used  Substance Use Topics  . Alcohol use: Not Currently    Comment: occasional beer  . Drug use: Never    Home Medications Prior to Admission medications   Medication Sig Start Date End Date Taking? Authorizing Provider  acetaminophen (TYLENOL) 325 MG tablet Take 2  tablets (650 mg total) by mouth every 6 (six) hours as needed for mild pain (or Fever >/= 101). 12/18/20   Angiulli, Lavon Paganini, PA-C  aspirin EC 81 MG tablet Take 81 mg by mouth daily. Swallow whole.    [provider]  atorvastatin (LIPITOR) 80 MG tablet Take 1 tablet (80 mg total) by mouth daily. 12/18/20   Angiulli, Lavon Paganini, PA-C  b complex vitamins capsule Take 1 capsule by mouth daily.    [provider]  divalproex (DEPAKOTE ER) 500 MG 24 hr tablet Take 2 tablets (1,000 mg total) by mouth at bedtime. 12/18/20   Angiulli, Lavon Paganini, PA-C  docusate sodium (COLACE) 100 MG capsule Take 1 capsule (100 mg total) by mouth 2 (two) times daily. 12/18/20   Angiulli, Lavon Paganini, PA-C  finasteride (PROSCAR) 5 MG tablet Take 1 tablet (5 mg total) by mouth daily. 12/18/20   Angiulli, Lavon Paganini, PA-C  Allergies    Flomax [tamsulosin hcl] and Levetiracetam  Review of Systems   Review of Systems  Constitutional: Positive for fever.  Neurological: Positive for weakness (generalized).  All other systems reviewed and are negative.   Physical Exam Updated Vital Signs BP (!) 90/51   Pulse (!) 109   Temp 99.7 F (37.6 C) (Rectal)   Resp (!) 25   Ht _0  (1.854 m)   Wt 86.2 kg   SpO2 97%   BMI 25.07 kg/m   Physical Exam Vitals and nursing note reviewed.  Constitutional:      Appearance: He is well-developed.     Comments: Elderly, warm to touch, hard of hearing  HENT:     Head: Normocephalic and atraumatic.  Eyes:     Conjunctiva/sclera: Conjunctivae normal.     Pupils: Pupils are equal, round, and reactive to light.  Cardiovascular:     Rate and Rhythm: Tachycardia present. Rhythm irregularly irregular.     Heart sounds: Normal heart sounds.     Comments: AFIB (chronic), 120's Pulmonary:     Effort: Pulmonary effort is normal. No respiratory distress.     Breath sounds: Normal breath sounds. No rhonchi.  Abdominal:     General: Bowel sounds are normal.     Palpations:  Abdomen is soft.     Tenderness: There is no abdominal tenderness. There is no rebound.  Musculoskeletal:        General: Normal range of motion.     Cervical back: Normal range of motion.  Skin:    General: Skin is warm and dry.  Neurological:     Mental Status: He is alert and oriented to person, place, and time.     Comments: Awake, alert, oriented to self and situation, following simple commands     ED Results / Procedures / Treatments   Labs (all labs ordered are listed, but only abnormal results are displayed) Labs Reviewed  CBC WITH DIFFERENTIAL/PLATELET - Abnormal; Notable for the following components:      Result Value   WBC 15.3 (*)    RBC 3.60 (*)    Hemoglobin 11.7 (*)    HCT 36.7 (*)    MCV 101.9 (*)    Neutro Abs 13.1 (*)    Lymphs Abs 0.4 (*)    Monocytes Absolute 1.4 (*)    Abs Immature Granulocytes 0.14 (*)    All other components within normal limits  COMPREHENSIVE METABOLIC PANEL - Abnormal; Notable for the following components:   CO2 21 (*)    Glucose, Bld 124 (*)    BUN 28 (*)    Creatinine, Ser 1.49 (*)    Calcium 8.4 (*)    Total Protein 5.9 (*)    Albumin 2.6 (*)    GFR, Estimated 45 (*)    All other components within normal limits  LACTIC ACID, PLASMA - Abnormal; Notable for the following components:   Lactic Acid, Venous 2.0 (*)    All other components within normal limits  URINALYSIS, ROUTINE W REFLEX MICROSCOPIC - Abnormal; Notable for the following components:   APPearance HAZY (*)    Hgb urine dipstick LARGE (*)    Protein, ur 30 (*)    Nitrite POSITIVE (*)    Leukocytes,Ua LARGE (*)    RBC / HPF >50 (*)    WBC, UA >50 (*)    Bacteria, UA RARE (*)    All other components within normal limits  VALPROIC ACID LEVEL - Abnormal; Notable for  the following components:   Valproic Acid Lvl 44 (*)    All other components within normal limits  RESP PANEL BY RT-PCR (FLU A&B, COVID) ARPGX2  CULTURE, BLOOD (ROUTINE X 2)  CULTURE, BLOOD (ROUTINE  X 2)  URINE CULTURE    EKG EKG Interpretation  Date/Time:  Monday December 23 2020 02:59:32 EDT Ventricular Rate:  114 PR Interval:    QRS Duration: 101 QT Interval:  345 QTC Calculation: 476 R Axis:   -22 Text Interpretation: Atrial fibrillation Borderline left axis deviation Borderline prolonged QT interval No significant change since last tracing Confirmed by Wandra Arthurs [phone]) on 12/09/2020 4:11:38 AM   Radiology CT Head Wo Contrast  Result Date: 12/07/2020 CLINICAL DATA:  Fall from bed, recent subdural hematoma EXAM: CT HEAD WITHOUT CONTRAST TECHNIQUE: Contiguous axial images were obtained from the base of the skull through the vertex without intravenous contrast. COMPARISON:  CT 11/30/2020, MR 11/26/2020 FINDINGS: Brain: Redemonstration of the mixed age extra-axial blood products across the left frontal convexity which appear decreased in size from the prior exam now measuring up to a maximal thickness of 9 mm, previously 15 mm at a similar level. Resolution of the pneumocephaly seen on the comparison exam. No new sites of hemorrhage. Overlying craniotomy defects are noted with postsurgical change. Stable regions of encephalomalacia in the bilateral frontal lobes. Additional punctate foci of hypoattenuation of bilateral basal ganglia likely reflecting sequela of prior lacunar type infarcts. Findings on a background of diffuse chronic microvascular angiopathy and parenchymal volume loss. No significant residual mass effect or midline shift. Vascular: Atherosclerotic calcification of the carotid siphons and intradural vertebral arteries. No hyperdense vessel. Skull: Postsurgical changes on the left frontal scalp with craniotomy defects likely for evacuation of the previously seen subdural hematoma. Some slightly increased left supraorbital/frontal scalp swelling may be related to new contusive change, correlate for point tenderness. No other sites of significant scalp swelling or hematoma. No  calvarial fracture or other acute osseous injury. Sinuses/Orbits: Minimal mural thickening in the paranasal sinuses. No layering air-fluid levels or pneumatized secretions. Bilateral lens extractions and right scleral buckle. Other: None. IMPRESSION: 1. Redemonstration of the attenuation extra-axial blood products across the left frontal convexity which appear decreased in size from the prior exam now measuring up to a maximal thickness of 9 mm, previously 15 mm at a similar level. Resolution of the pneumocephaly seen on the comparison exam. No new sites of hemorrhage. 2. Some slightly increased left supraorbital/frontal scalp swelling may be related to new contusive change, correlate for point tenderness. No calvarial fracture. 3. Stable regions of encephalomalacia in the bilateral frontal lobes and remote lacunar infarcts in the basal ganglia. 4. Background of diffuse chronic microvascular angiopathy and parenchymal volume loss. 5. Intracranial atherosclerosis. Electronically Signed   By: Lovena Le M.D.   On: 12/22/2020 02:40   DG Chest Port 1 View  Result Date: 12/04/2020 CLINICAL DATA:  87 year old male with sepsis. EXAM: PORTABLE CHEST 1 VIEW COMPARISON:  Chest radiograph dated 11/27/2020. FINDINGS: No focal consolidation, pleural effusion, or pneumothorax. Mild cardiomegaly. Atherosclerotic calcification of the aorta. Aortic valve repair and calcification of the mitral annulus. No acute osseous pathology. IMPRESSION: 1. No acute cardiopulmonary process. 2. Mild cardiomegaly. Electronically Signed   By: Anner Crete M.D.   On: 12/20/2020 02:43    Procedures Procedures   CRITICAL CARE Performed by: Larene Pickett   Total critical care time: 45 minutes  Critical care time was exclusive of separately billable procedures and treating  other patients.  Critical care was necessary to treat or prevent imminent or life-threatening deterioration.  Critical care was time spent personally by me  on the following activities: development of treatment plan with patient and/or surrogate as well as nursing, discussions with consultants, evaluation of patient's response to treatment, examination of patient, obtaining history from patient or surrogate, ordering and performing treatments and interventions, ordering and review of laboratory studies, ordering and review of radiographic studies, pulse oximetry and re-evaluation of patient's condition.   Medications Ordered in ED Medications  lactated ringers infusion ( Intravenous New Bag/Given 12/07/2020 0316)  lactated ringers bolus 1,000 mL (0 mLs Intravenous Stopped 12/06/2020 0359)    And  lactated ringers bolus 1,000 mL (0 mLs Intravenous Stopped 12/15/2020 0422)    And  lactated ringers bolus 1,000 mL (1,000 mLs Intravenous New Bag/Given 12/08/2020 0400)  vancomycin (VANCOREADY) IVPB 1500 mg/300 mL (0 mg Intravenous Stopped 12/09/2020 0505)  ceFEPIme (MAXIPIME) 2 g in sodium chloride 0.9 % 100 mL IVPB (0 g Intravenous Stopped 12/10/2020 0359)  acetaminophen (TYLENOL) suppository 650 mg (650 mg Rectal Given 12/06/2020 0430)    ED Course  I have reviewed the triage vital signs and the nursing notes.  Pertinent labs & imaging results that were available during my care of the patient were reviewed by me and considered in my medical decision making (see chart for details).    MDM Rules/Calculators/A&P  87 y.o. M presenting to the ED as code sepsis.  Was recently released from rehab facility 3/10/11/20 after hospital admission for SDH.  His Eliquis has been held, but did resume Lovenox prophylaxis 11/25/2020.  Today, found to be generally weak and slid out of the bed.  There was no reported head injury or loss of consciousness.  Did have a fever of 101.91F PTA, took tylenol at home.  On arrival to ED remains febrile, tachycardic, BP stable.  Code sepsis activated.  Work-up pending including labs, cultures, x-ray.  Started IVF, broad spectrum abx.  Given his recent  history of SDH, head CT was also obtained.  Work-up as above-- lactate 2.0, WBC count 15K.  SrCr and BUN appear at baseline compared with prior.  CXR without acute findings.  CT head with reduced size of subdural compared with prior, no new areas of bleeding observed.  Awaiting UA.  Family now at bedside and updated on findings thus far.  4:58 AM UA appears infectious with + nitrites, >50 WBC, large leuks.  Results discussed with son-- states since he was in rehab facility he has had a few UTI's and was concerned for that today.  He does not in and out cath so not sure why this keeps happening.  Will discuss with hospitalist for admission.  Discussed with Dr. Marlowe Sax-- will admit for ongoing care.  Final Clinical Impression(s) / ED Diagnoses Final diagnoses:  Sepsis, due to unspecified organism, unspecified whether acute organ dysfunction present [facility])  Acute cystitis with hematuria    Rx / DC Orders ED Discharge Orders    None       Larene Pickett, PA-C 12/22/2020 0532    Drenda Freeze, MD 12/07/2020 [phone]

## 2020-12-23 NOTE — Progress Notes (Signed)
Multiple attempts at ABG with no success. MD notified, waiting for new orders.

## 2020-12-23 NOTE — Procedures (Signed)
EEG Report Indication: Altered mental status--known L SDH/craniotomy  This study was recorded in the waking/drowsy vs encephalopathic state.  The duration of the study was 28 minutes.  Electrodes were placed according to the International 10/20 system.  Video was reviewed for clinical correlation as needed.   There is some degree of waking organization during periods where there is muscle artifact indicative of a more alert state--there is a discernible though very attenuated anterior - posterior voltage and frequency gradient.  In the occipital leads there is a possible (definitive reactivity is not confirmed) but symmetric posterior dominant rhythm of approximately 7 hertz, which is significantly slower than expected for age, very poorly sustained and indistinct.  Anteriorly, is the expected pattern of faster frequency, lower voltage waveforms though in a significantly reduced amount as compared to normal.  There is intermittent delta slowing in the anterior>posterior leads which is grossly symmetric.   In general, waveforms are slightly higher in amplitude in the left hemisphere and better defined, although this is most likely related to breach artifact.  In addition, early loss of leads in the right posterior quadrant makes the comparison difficult.  During the less alert (no muscle artifact/blink artifiact) there is the disappearance of the gradient, a general shift to slower frequencies, disappearance of the suspected posterior dominant rhythm, but no sleep architecture.   Hyperventilation: deferred Photic stimulation: deferred  There are no clear paroxysmal or epileptiform abnormalities or interhemispheric asymmetries seen during this portion of the recording.   Impression:   This is an abnormal waking and drowsy study due to very attenuated organization as described above.  In addition, there is some degree of focality, with more distinct waveforms/slightly higher amplitudes in the left  hemisphere--this is favored to be due to breach artifact however, and early lead loss in the right posterior quadrant makes an exact comparison difficult.  These findings are most consistent with a mild-moderate diffuse encephalopathy, with possible/not definitive increased areas of focal neuronal dysfunction in the right (artifact is more favored) and possibly in the anterior regions (bilaterally).  There are no clear epileptiform abnormalities.

## 2020-12-23 NOTE — Consult Note (Signed)
Neurology Consultation  Reason for Consult: Concern for stroke  Referring Physician: Marzetta Board, MD  CC: Change in speech and mental status after presentation  History is obtained from: Family, Chart, Staff  HPI: Cesar [name] is a 87 y.o. male with history of permanent A. fib, CVA, seizure disorder, history of DVT, hyperlipidemia, chronic kidney disease stage III, severe AS status post TAVR in January 2022, OSA not on CPAP, nonobstructive CAD comes into the hospital with confusion. Patient was dx with subdural hemorrhage 11/2020 and now has chronic left sided subdural hematoma status post bur hole drainage. Patient was cleared to begin DVT ppx 2/21. Per family patient presents to the ED early this AM after they noted that patient had a fever and appeared more confused. On presentation to hospital patient was noted to have UTI and met sepsis criteria. Patient was also noted to have Acute Bilateral lower extremity DVTs on ultrasound performed during his ED stay. Initially communicative as of 3:30 AM and shortly thereafter he was noted to decline in terms of his speech. Neurology was consulted and on initial exam by Neurology team the patient was not able to communicate beyond a few grunts. Patient's wife and son were in the room and daughter was on the phone. Son reported that he was with patient when he had slurred speech around 4 am in the ED and noted that the patient also had decreased use of his L arm and L leg around this time. Since this episode patient has not really used his L arm.and RN reports that the patient has had gradual decline in cognitive function and left sided motor function over the course of her shift. He is now also with LLE weakness. RN notes that patient was able to talk and moved all of his limbs initially, but is no longer doing these things and his speech has gone from intelligible to slurred, to grunts.   He is on VPA at home for his seizure disorder. He has had one  seizure in the past (June 2020) for which he was started on anticonvulsant therapy.  LKW: 3:30 AM tpa given?: No, out of time window and with subdural hematoma  ROS: Unable to obtain due to altered mental status.   Past Medical History:  Diagnosis Date  . AAA (abdominal aortic aneurysm) ([place])   . CKD (chronic kidney disease), stage III ([facility])    baseline Cr 1.4-1.5 in 2016-2017 by PCP)  . CVA (cerebral infarction)   . Depression   . DVT (deep venous thrombosis) ([place]) 2006  . H/O blood clots   . Hematuria   . HOH (hard of hearing)   . Hypercholesterolemia   . OSA (obstructive sleep apnea)    intol to CPAP  . Permanent atrial fibrillation ([place])   . Prostate enlargement   . Pulmonary nodules   . 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 Buena Irish and Dr. Roxy Manns  . Thoracic ascending aortic aneurysm ([place])    4.5cm in 11/2018, stable since 2016  . TIA (transient ischemic attack)      Family History  Problem Relation Age of Onset  . Heart failure Mother   . Diabetes Father   . Stroke Father      Social History:   reports that he quit smoking about 68 years ago. His smoking use included cigarettes. He has a 10.00 pack-year smoking history. He has never used  smokeless tobacco. He reports previous alcohol use. He reports that he does not use drugs.  Medications  Current Facility-Administered Medications:  .  acetaminophen (TYLENOL) tablet 650 mg, 650 mg, Oral, Q6H PRN **OR** acetaminophen (TYLENOL) suppository 650 mg, 650 mg, Rectal, Q6H PRN, Shela Leff, MD .  ceFEPIme (MAXIPIME) 2 g in sodium chloride 0.9 % 100 mL IVPB, 2 g, Intravenous, Q12H, Oriet, Jonathan, RPH .  lactated ringers infusion, , Intravenous, Continuous, Shela Leff, MD, Last Rate: 75 mL/hr at 12/04/2020 0830, Rate Change at 12/20/2020 0830 .  valproate (DEPACON) 500 mg in dextrose 5 % 50 mL IVPB, 500 mg, Intravenous, Q12H,  Shela Leff, MD  Current Outpatient Medications:  .  acetaminophen (TYLENOL) 325 MG tablet, Take 2 tablets (650 mg total) by mouth every 6 (six) hours as needed for mild pain (or Fever >/= 101)., Disp: , Rfl:  .  aspirin EC 81 MG tablet, Take 81 mg by mouth daily. Swallow whole., Disp: , Rfl:  .  atorvastatin (LIPITOR) 80 MG tablet, Take 1 tablet (80 mg total) by mouth daily., Disp: 30 tablet, Rfl: 0 .  b complex vitamins capsule, Take 1 capsule by mouth daily., Disp: , Rfl:  .  divalproex (DEPAKOTE ER) 500 MG 24 hr tablet, Take 2 tablets (1,000 mg total) by mouth at bedtime., Disp: 60 tablet, Rfl: 0 .  docusate sodium (COLACE) 100 MG capsule, Take 1 capsule (100 mg total) by mouth 2 (two) times daily., Disp: 10 capsule, Rfl: 0 .  finasteride (PROSCAR) 5 MG tablet, Take 1 tablet (5 mg total) by mouth daily., Disp: 30 tablet, Rfl: 0 .  pantoprazole (PROTONIX) 40 MG tablet, Take 40 mg by mouth every evening., Disp: , Rfl:    Exam: Current vital signs: BP 127/62   Pulse 77   Temp 99.7 F (37.6 C) (Rectal)   Resp 17   Ht 6' 1"  (1.854 m)   Wt 86.2 kg   SpO2 97%   BMI 25.07 kg/m  Vital signs in last 24 hours: Temp:  [99.7 F (37.6 C)] 99.7 F (37.6 C) (03/21 0421) Pulse Rate:  [57-130] 77 (03/21 1400) Resp:  [17-35] 17 (03/21 1300) BP: (90-154)/(51-103) 127/62 (03/21 1400) SpO2:  [94 %-99 %] 97 % (03/21 1400) Weight:  [86.2 kg] 86.2 kg (03/21 0443)  Initial Neurology exam at 3 PM, follow up exam findings at 5 PM are in bold face.  GENERAL: Aroused to tactile and verbal stimuli HEENT: - Normocephalic and atraumatic, dry mm,  LUNGS - Normal respiratory effort. SaO2 CV - A fib on tele ABDOMEN - Soft, nontender Ext: warm, well perfused  NEURO:  Mental Status: Patient becomes alert with verbal stimuli, but requires some tactile stimulation intermittently to keep awake. Speech/Language: speech is non intelligible, grunts intermittently when asked questions, but does not  respond for every question.  Cranial Nerves:  II: PERR 2 mm/brisk. Unable to assess visual fields >> No blink to threat on the left III, IV, VI: Does not follow or track across midline to the left. Gazes to the right. V: Does not blink to threat of the left side VII: Face is symmetric resting and smiling >> left facial droop. Able to clinch jaw to keep dentures in   VIII: Hearing intact to voice IX, X: Palate elevation unable to assess XI: No use of the left shoulder or arm. Falls onto his left side. XII: Head turned to the right.  Motor: At least 3/5 in RLE and good grip in the R  hand. 0/5 in LUE, does retract LLE to noxious stimuli, 2/5.  Sensation- Intact to noxious stimuli in RUE, RLE, and LLE. Overall with decreased responses to stimuli on the left.  DTRs: 2+ throughout.  Coordination:Unable to assess due to AMS Gait- deferred  1a Level of Conscious.: 2 1b LOC Questions: 2, groans 1c LOC Commands: 1 2 Best Gaze: 2 3 Visual: 2 4 Facial Palsy: 3 5a Motor Arm - left: 4 5b Motor Arm - Right: 2 6a Motor Leg - Left: 3 6b Motor Leg - Right: 0 7 Limb Ataxia: 0 8 Sensory: 1 9 Best Language: 3 10 Dysarthria: 2 11 Extinct. and Inatten.: 2 TOTAL: 29   Labs I have reviewed labs in epic and the results pertinent to this consultation are: TSH: 2.876 Ammonia:  14 Vit B12: 541  CBC    Component Value Date/Time   WBC 15.1 (H) 12/24/2020 0732   RBC 3.15 (L) 12/03/2020 0732   HGB 7.8 (L) 12/31/2020 1037   HCT 23.0 (L) 12/28/2020 1037   PLT 147 (L) 12/06/2020 0732   MCV 101.6 (H) 12/12/2020 0732   MCH 32.7 12/08/2020 0732   MCHC 32.2 12/03/2020 0732   RDW 14.0 12/28/2020 0732   LYMPHSABS 0.4 (L) 12/08/2020 0218   MONOABS 1.4 (H) 12/17/2020 0218   EOSABS 0.2 12/07/2020 0218   BASOSABS 0.1 12/06/2020 0218    CMP     Component Value Date/Time   NA 141 12/03/2020 1037   NA 141 10/30/2020 1520   K 5.8 (H) 12/03/2020 1037   CL 109 12/18/2020 0745   CO2 23 01/02/2021 0745    GLUCOSE 112 (H) 12/20/2020 0745   BUN 23 12/24/2020 0745   BUN 28 (H) 10/30/2020 1520   CREATININE 1.44 (H) 12/03/2020 0745   CALCIUM 8.4 (L) 12/10/2020 0745   PROT 5.9 (L) 12/07/2020 0218   PROT 6.7 08/12/2020 1137   ALBUMIN 2.6 (L) 12/27/2020 0218   ALBUMIN 4.2 08/12/2020 1137   AST 27 12/22/2020 0218   ALT 17 01/02/2021 0218   ALKPHOS 42 12/24/2020 0218   BILITOT 0.8 12/11/2020 0218   BILITOT 0.9 08/12/2020 1137   GFRNONAA 46 (L) 12/31/2020 0745   GFRAA 57 (L) 10/30/2020 1520    Lipid Panel     Component Value Date/Time   CHOL 120 10/21/2020 0311   TRIG 53 10/21/2020 0311   HDL 62 10/21/2020 0311   CHOLHDL 1.9 10/21/2020 0311   VLDL 11 10/21/2020 0311   LDLCALC 47 10/21/2020 0311     Imaging I have reviewed the images obtained:  CT-scan of the brain  CT Head Wo Contrast  Result Date: 12/29/2020 CLINICAL DATA:  87 year old male with acute altered mental status, slurred speech and facial droop. Recent fall, subdural hematoma. EXAM: CT HEAD WITHOUT CONTRAST TECHNIQUE: Contiguous axial images were obtained from the base of the skull through the vertex without intravenous contrast. COMPARISON:  Head CT today at 0224 hours. Brain MRI 11/06/2020. FINDINGS: Brain: Residual mixed density left subdural collection, likely with a degree of postoperative dural thickening, is stable and measures 7 mm or smaller at most levels. As before there is no significant midline shift, only mild mass effect on the left hemisphere. Stable ventricle size and configuration. Bilateral MCA territory encephalomalacia, Patchy and confluent bilateral cerebral white matter hypodensity remains stable. Streak artifacts suspected in the pons now. No new intracranial hemorrhage identified. No new cortically based infarct identified. Vascular: Calcified atherosclerosis at the skull base.6 No suspicious intracranial vascular hyperdensity. [place]  Skull: Stable left convexity burr holes. No acute osseous abnormality  identified. Sinuses/Orbits: Visualized paranasal sinuses and mastoids are stable and well pneumatized. Other: No acute orbit or scalp soft tissue finding. IMPRESSION: 1. Stable since 0224 hours today.  No new intracranial abnormality. 2. Residual mixed density left subdural collection, 7-9 mm. Stable mild mass effect with no midline shift. 3. Chronic ischemic disease in both hemispheres. Electronically Signed   By: Genevie Ann M.D.   On: 12/12/2020 07:59   CT Head Wo Contrast  Result Date: 12/24/2020 CLINICAL DATA:  Fall from bed, recent subdural hematoma EXAM: CT HEAD WITHOUT CONTRAST TECHNIQUE: Contiguous axial images were obtained from the base of the skull through the vertex without intravenous contrast. COMPARISON:  CT 11/30/2020, MR 11/26/2020 FINDINGS: Brain: Redemonstration of the mixed age extra-axial blood products across the left frontal convexity which appear decreased in size from the prior exam now measuring up to a maximal thickness of 9 mm, previously 15 mm at a similar level. Resolution of the pneumocephaly seen on the comparison exam. No new sites of hemorrhage. Overlying craniotomy defects are noted with postsurgical change. Stable regions of encephalomalacia in the bilateral frontal lobes. Additional punctate foci of hypoattenuation of bilateral basal ganglia likely reflecting sequela of prior lacunar type infarcts. Findings on a background of diffuse chronic microvascular angiopathy and parenchymal volume loss. No significant residual mass effect or midline shift. Vascular: Atherosclerotic calcification of the carotid siphons and intradural vertebral arteries. No hyperdense vessel. Skull: Postsurgical changes on the left frontal scalp with craniotomy defects likely for evacuation of the previously seen subdural hematoma. Some slightly increased left supraorbital/frontal scalp swelling may be related to new contusive change, correlate for point tenderness. No other sites of significant scalp  swelling or hematoma. No calvarial fracture or other acute osseous injury. Sinuses/Orbits: Minimal mural thickening in the paranasal sinuses. No layering air-fluid levels or pneumatized secretions. Bilateral lens extractions and right scleral buckle. Other: None. IMPRESSION: 1. Redemonstration of the attenuation extra-axial blood products across the left frontal convexity which appear decreased in size from the prior exam now measuring up to a maximal thickness of 9 mm, previously 15 mm at a similar level. Resolution of the pneumocephaly seen on the comparison exam. No new sites of hemorrhage. 2. Some slightly increased left supraorbital/frontal scalp swelling may be related to new contusive change, correlate for point tenderness. No calvarial fracture. 3. Stable regions of encephalomalacia in the bilateral frontal lobes and remote lacunar infarcts in the basal ganglia. 4. Background of diffuse chronic microvascular angiopathy and parenchymal volume loss. 5. Intracranial atherosclerosis. Electronically Signed   By: Lovena Le M.D.   On: 12/10/2020 02:40   EEG adult  Result Date: 12/09/2020 Raenette Rover, MD     12/20/2020  1:03 PM EEG Report Indication: Altered mental status--known L SDH/craniotomy This study was recorded in the waking/drowsy vs encephalopathic state.  The duration of the study was 28 minutes.  Electrodes were placed according to the International 10/20 system.  Video was reviewed for clinical correlation as needed.  There is some degree of waking organization during periods where there is muscle artifact indicative of a more alert state--there is a discernible though very attenuated anterior - posterior voltage and frequency gradient.  In the occipital leads there is a possible (definitive reactivity is not confirmed) but symmetric posterior dominant rhythm of approximately 7 hertz, which is significantly slower than expected for age, very poorly sustained and indistinct.  Anteriorly, is  the expected pattern of  faster frequency, lower voltage waveforms though in a significantly reduced amount as compared to normal.  There is intermittent delta slowing in the anterior>posterior leads which is grossly symmetric.  In general, waveforms are slightly higher in amplitude in the left hemisphere and better defined, although this is most likely related to breach artifact.  In addition, early loss of leads in the right posterior quadrant makes the comparison difficult. During the less alert (no muscle artifact/blink artifiact) there is the disappearance of the gradient, a general shift to slower frequencies, disappearance of the suspected posterior dominant rhythm, but no sleep architecture.  Hyperventilation: deferred Photic stimulation: deferred  There are no clear paroxysmal or epileptiform abnormalities or interhemispheric asymmetries seen during this portion of the recording.  Impression:  This is an abnormal waking and drowsy study due to very attenuated organization as described above.  In addition, there is some degree of focality, with more distinct waveforms/slightly higher amplitudes in the left hemisphere--this is favored to be due to breach artifact however, and early lead loss in the right posterior quadrant makes an exact comparison difficult.  These findings are most consistent with a mild-moderate diffuse encephalopathy, with possible/not definitive increased areas of focal neuronal dysfunction in the right (artifact is more favored) and possibly in the anterior regions (bilaterally).  There are no clear epileptiform abnormalities.  VAS [place] LOWER EXTREMITY VENOUS (DVT)  Result Date: 12/05/2020  Lower Venous DVT Study Indications: Swelling, and Edema.  Limitations: Patient's positioning and altered mental status. Comparison Study: no prior Performing Technologist: Sharion Dove RVS  Examination Guidelines: A complete evaluation includes B-mode imaging, spectral Doppler, color Doppler, and  power Doppler as needed of all accessible portions of each vessel. Bilateral testing is considered an integral part of a complete examination. Limited examinations for reoccurring indications may be performed as noted. The reflux portion of the exam is performed with the patient in reverse Trendelenburg.  +---------+---------------+---------+-----------+----------+-------------------+ RIGHT    CompressibilityPhasicitySpontaneityPropertiesThrombus Aging      +---------+---------------+---------+-----------+----------+-------------------+ CFV      Full                                                             +---------+---------------+---------+-----------+----------+-------------------+ SFJ      Full                                                             +---------+---------------+---------+-----------+----------+-------------------+ FV Prox  Full                                                             +---------+---------------+---------+-----------+----------+-------------------+ FV Mid   Full                                                             +---------+---------------+---------+-----------+----------+-------------------+  FV DistalFull                                                             +---------+---------------+---------+-----------+----------+-------------------+ PFV      Full                                                             +---------+---------------+---------+-----------+----------+-------------------+ POP                                                   patent by color and                                                       Doppler             +---------+---------------+---------+-----------+----------+-------------------+ PTV      None                                         Acute               +---------+---------------+---------+-----------+----------+-------------------+ PERO     None                                          Acute               +---------+---------------+---------+-----------+----------+-------------------+   +---------+---------------+---------+-----------+----------+-------------------+ LEFT     CompressibilityPhasicitySpontaneityPropertiesThrombus Aging      +---------+---------------+---------+-----------+----------+-------------------+ CFV      Full           Yes      Yes                                      +---------+---------------+---------+-----------+----------+-------------------+ SFJ      Full                                                             +---------+---------------+---------+-----------+----------+-------------------+ FV Prox  Full                                                             +---------+---------------+---------+-----------+----------+-------------------+ FV Mid   Full                                                             +---------+---------------+---------+-----------+----------+-------------------+  FV DistalFull                                                             +---------+---------------+---------+-----------+----------+-------------------+ PFV      Full                                                             +---------+---------------+---------+-----------+----------+-------------------+ POP                                                   patent by color and                                                       Doppler             +---------+---------------+---------+-----------+----------+-------------------+ PTV      Full                                                             +---------+---------------+---------+-----------+----------+-------------------+ PERO     None                                         Acute               +---------+---------------+---------+-----------+----------+-------------------+     Summary: RIGHT: -  Findings consistent with acute deep vein thrombosis involving the right posterior tibial veins, and right peroneal veins.  LEFT: - Findings consistent with acute deep vein thrombosis involving the left peroneal veins.  *See table(s) above for measurements and observations.    Preliminary     Assessment: 87 year old male with a history of recent left subdural hematoma in February who today presents with symptoms and signs since 3:30 AM concerning for a new CVA involving his right cerebral hemisphere. Of note, patient also has a hx of seizure-like activity and per family was taking his AEDs as prescribed.  1. Exam reveals findings most consistent with a large right MCA stroke.  2. Repeat CT head: Stable since 0224 hours today.  No new intracranial abnormality. Residual mixed density left subdural collection, 7-9 mm. Stable mild mass effect with no midline shift. Chronic ischemic disease in both hemispheres. 3. EEG: This is an abnormal waking and drowsy study due tovery attenuated organization as described above. In addition, there is some degree of focality, with more distinct waveforms/slightly higher amplitudes in the left hemisphere--this is favored to be due to breach artifact however, and early lead loss in the right posterior quadrant makes an exact  comparison difficult.  These findings are most consistent with a mild-moderate diffuse encephalopathy, with possible/not definitive increased areas of focal neuronal dysfunction in the right (artifact is more favored) and possibly in the anterior regions (bilaterally).  There are no clear epileptiform abnormalities.  Recommendations: - Goals of care discussion - F/U HgbA1c, fasting lipid panel - PT consult, OT consult, Speech consult - Echocardiogram - Statin - Hold antiplatelet med due to subdural hematoma  - Risk factor modification - Telemetry monitoring - Frequent neuro checks - NPO until passes stroke swallow screen - Per Neurosurgery note  from February admission, the plan was to hold anticoagulation for his atrial fibrillation due to the subdural hematoma for likely 6 weeks or longer, depending on subsequent CT scan at 4 wks.  - MRI brain  Addendum: - MRI brain reveals multiple small areas of acute infarct in the right cerebral hemisphere. There is interval resolution of small infarct in the left hippocampus. There is loss of flow void in the right internal carotid artery compatible with interval occlusion. Subacute subdural hematoma left 7 mm, unchanged - Given MRI findings, STAT CTA of head and neck with CTP is needed. The patient was last at his baseline per family at about 3:30 AM. He is within the 24 hour endovascular time window.   Addendum: - CTA of head and neck confirms right ICA occlusion - CTP reveals a 109 cc region of hypoperfusion in the right cerebral hemisphere without core infarct visible - Modified Rankin Score: 3 - The patient is a candidate for endovascular thrombectomy. Discussed extensively the risks/benefits of thrombectomy treatment vs. no treatment with the patient's family, including approximate 10% risk of SAH with thrombectomy, which carries significant chance for death or long morbidity, versus worse overall outcomes on average in patients within thrombectomy 24 hour time window who do not undergo the procedure. Overall benefits of thrombectomy regarding long-term prognosis are felt to outweigh risks. The patient was unable to give informed consent due to altered mental status. The patient's family expressed understanding and wish to proceed with thrombectomy. Consent form signed. - Stroke order set - Additional recommendations pending Dr. Arlean Hopping completion of angiogram and possible thrombectomy.   Damita Dunnings, MD PGY-1   I have seen and examined the patient. I have formulated the assessment and recommendations. 87 year old male with a history of recent left subdural hematoma in February who today  presents with symptoms and signs since 3:30 AM concerning for a new CVA involving his right cerebral hemisphere. Exam reveals findings referable to a large region of hypoperfusion/stroke in the right cerebral hemisphere. Recommendations as above.   Electronically signed: Dr. Kerney Elbe

## 2020-12-23 NOTE — ED Notes (Signed)
Patient transported to MRI on monitors with son and RN.

## 2020-12-23 NOTE — Transfer of Care (Signed)
Immediate Anesthesia Transfer of Care Note  Patient: OSAMU OLGUIN  Procedure(s) Performed: IR WITH ANESTHESIA (N/A )  Patient Location: ICU  Anesthesia Type:General  Level of Consciousness: Patient remains intubated per anesthesia plan  Airway & Oxygen Therapy: Patient remains intubated per anesthesia plan and Patient placed on Ventilator (see vital sign flow sheet for setting)  Post-op Assessment: Report given to RN and Post -op Vital signs reviewed and stable  Post vital signs: Reviewed and stable  Last Vitals:  Vitals Value Taken Time  BP 149/78 12/29/2020 2015  Temp    Pulse 60 12/08/2020 2020  Resp 18 12/05/2020 2020  SpO2 100 % 12/29/2020 2020  Vitals shown include unvalidated device data.  Last Pain:  Vitals:   12/13/2020 0421  TempSrc: Rectal         Complications: No complications documented.

## 2020-12-23 NOTE — ED Notes (Signed)
Patient transported to CT stat by this RN and Neuro MD and resident accompanied.

## 2020-12-23 NOTE — Anesthesia Postprocedure Evaluation (Signed)
Anesthesia Post Note  Patient: Cesar [name]  Procedure(s) Performed: IR WITH ANESTHESIA (N/A )     Patient location during evaluation: SICU Anesthesia Type: General Level of consciousness: sedated Pain management: pain level controlled Vital Signs Assessment: post-procedure vital signs reviewed and stable Respiratory status: patient remains intubated per anesthesia plan Cardiovascular status: stable Postop Assessment: no apparent nausea or vomiting Anesthetic complications: no   No complications documented.  Last Vitals:  Vitals:   12/21/2020 2030 12/09/2020 2033  BP: 126/74   Pulse: (!) 59 (!) 59  Resp: 18 18  Temp:    SpO2: 100% 100%    Last Pain:  Vitals:   12/15/2020 2010  TempSrc: Axillary                 Tiajuana Amass

## 2020-12-23 NOTE — Progress Notes (Signed)
VASCULAR LAB    Bilateral lower extremity venous duplex has been performed.  See CV proc for preliminary results.  Messaged results to Dr. Cruzita Lederer via secure chat  Sharion Dove, RVT 12/19/2020, 2:28 PM

## 2020-12-23 NOTE — ED Notes (Signed)
Patient to interventional radiology stat via stretcher with family and [place], [place]. Patient care handed off to team there. Patient on ED monitors and switched to anesthesia monitors. VSS. Patient's belongings with family.

## 2020-12-23 NOTE — Progress Notes (Signed)
EEG complete - results pending 

## 2020-12-23 NOTE — ED Notes (Signed)
This nurse at bedside for IV therapy. Upon arrival to room, pt noted to have mentation changes. Pupils appear constricted and pt unable to maintain eye contact, speech is incomprehensible but patient able to follow commands. Provider made aware of findings and to bedside. New orders placed and performed by this RN. Will continue to monitor pt for any acute changes.

## 2020-12-23 NOTE — ED Notes (Signed)
Patient on MRI scanner and on monitors that are MRI compatible. RN in line of sight. Patient positioned with sandbags and various cushions as he does not follow commands consistently and is very figity with his right hand and foot. MD messaged after some scans were poor d/t motion artifact. Unable to sedate d/t compromised airway management per MD. Will get what scans we can obtain. Patient's vitals remain stable at this time.

## 2020-12-23 NOTE — Anesthesia Procedure Notes (Signed)
Arterial Line Insertion Start/End03/04/2021 6:15 PM, 12/13/2020 6:27 PM Performed by: Dorthea Cove, CRNA, CRNA  Patient location: OOR procedure area. Preanesthetic checklist: patient identified, IV checked, site marked, risks and benefits discussed, surgical consent, monitors and equipment checked, pre-op evaluation, timeout performed and anesthesia consent Left, radial was placed Catheter size: 20 G Hand hygiene performed  and Seldinger technique used  Attempts: 3 Procedure performed without using ultrasound guided technique. Following insertion, dressing applied and Biopatch. Post procedure assessment: normal  Patient tolerated the procedure well with no immediate complications.

## 2020-12-23 NOTE — Progress Notes (Signed)
Pharmacy Antibiotic Note  Cesar [name] is a 87 y.o. male admitted on 12/28/2020 with sepsis, concern for UTI and given 1x cefepime/vanc.  Pharmacy has been consulted for continuation of cefepime dosing.  Plan: Cefepime 2g IV every 12 hours Monitor renal function, UCx and clinical progression to narrow   Height: 6\' 1"  (185.4 cm) Weight: 86.2 kg (190 lb) IBW/kg (Calculated) : 79.9  Temp (24hrs), Avg:99.7 F (37.6 C), Min:99.7 F (37.6 C), Max:99.7 F (37.6 C)  Recent Labs  Lab 12/17/20 0439 12/18/20 0624 01/01/2021 0211 12/08/2020 0218  WBC  --   --   --  15.3*  CREATININE 1.52* 1.48*  --  1.49*  LATICACIDVEN  --   --  2.0*  --     Estimated Creatinine Clearance: 38 mL/min (A) (by C-G formula based on SCr of 1.49 mg/dL (H)).    Allergies  Allergen Reactions  . Flomax [Tamsulosin Hcl] Other (See Comments)    Not able to urinate  . Levetiracetam Other (See Comments)    Unknown reaction    Bertis Ruddy, PharmD Clinical Pharmacist ED Pharmacist Phone # [phone] 12/21/2020 7:34 AM

## 2020-12-23 NOTE — Anesthesia Procedure Notes (Signed)
Procedure Name: Intubation Date/Time: 12/15/2020 6:09 PM Performed by: Moshe Salisbury, CRNA Pre-anesthesia Checklist: Patient identified, Emergency Drugs available, Suction available and Patient being monitored Patient Re-evaluated:Patient Re-evaluated prior to induction Oxygen Delivery Method: Circle System Utilized Preoxygenation: Pre-oxygenation with 100% oxygen Induction Type: IV induction Ventilation: Mask ventilation without difficulty Laryngoscope Size: Mac and 4 Grade View: Grade II Tube type: Oral Tube size: 8.0 mm Number of attempts: 1 Airway Equipment and Method: Stylet Placement Confirmation: ETT inserted through vocal cords under direct vision,  positive ETCO2 and breath sounds checked- equal and bilateral Secured at: 23 cm Tube secured with: Tape Dental Injury: Teeth and Oropharynx as per pre-operative assessment

## 2020-12-24 ENCOUNTER — Encounter (HOSPITAL_COMMUNITY): Payer: Self-pay | Admitting: Radiology

## 2020-12-24 ENCOUNTER — Inpatient Hospital Stay (HOSPITAL_COMMUNITY): Payer: Medicare Other

## 2020-12-24 ENCOUNTER — Other Ambulatory Visit (HOSPITAL_COMMUNITY): Payer: Medicare Other

## 2020-12-24 DIAGNOSIS — N390 Urinary tract infection, site not specified: Secondary | ICD-10-CM

## 2020-12-24 DIAGNOSIS — I63411 Cerebral infarction due to embolism of right middle cerebral artery: Secondary | ICD-10-CM

## 2020-12-24 DIAGNOSIS — I6601 Occlusion and stenosis of right middle cerebral artery: Secondary | ICD-10-CM | POA: Diagnosis not present

## 2020-12-24 DIAGNOSIS — R319 Hematuria, unspecified: Secondary | ICD-10-CM

## 2020-12-24 DIAGNOSIS — R569 Unspecified convulsions: Secondary | ICD-10-CM

## 2020-12-24 DIAGNOSIS — I63231 Cerebral infarction due to unspecified occlusion or stenosis of right carotid arteries: Secondary | ICD-10-CM | POA: Diagnosis not present

## 2020-12-24 DIAGNOSIS — J9601 Acute respiratory failure with hypoxia: Secondary | ICD-10-CM | POA: Diagnosis not present

## 2020-12-24 LAB — BASIC METABOLIC PANEL
Anion gap: 8 (ref 5–15)
BUN: 22 mg/dL (ref 8–23)
CO2: 21 mmol/L — ABNORMAL LOW (ref 22–32)
Calcium: 8.1 mg/dL — ABNORMAL LOW (ref 8.9–10.3)
Chloride: 110 mmol/L (ref 98–111)
Creatinine, Ser: 1.16 mg/dL (ref 0.61–1.24)
GFR, Estimated: >60 mL/min (ref >60)
Glucose, Bld: 105 mg/dL — ABNORMAL HIGH (ref 70–99)
Potassium: 4.1 mmol/L (ref 3.5–5.1)
Sodium: 139 mmol/L (ref 135–145)

## 2020-12-24 LAB — CBC WITH DIFFERENTIAL/PLATELET
Abs Immature Granulocytes: 0.06 10*3/uL (ref 0.00–0.07)
Basophils Absolute: 0.1 10*3/uL (ref 0.0–0.1)
Basophils Relative: 1 %
Eosinophils Absolute: 0.1 10*3/uL (ref 0.0–0.5)
Eosinophils Relative: 1 %
HCT: 30 % — ABNORMAL LOW (ref 39.0–52.0)
Hemoglobin: 9.9 g/dL — ABNORMAL LOW (ref 13.0–17.0)
Immature Granulocytes: 1 %
Lymphocytes Relative: 7 %
Lymphs Abs: 0.7 10*3/uL (ref 0.7–4.0)
MCH: 32.5 pg (ref 26.0–34.0)
MCHC: 33 g/dL (ref 30.0–36.0)
MCV: 98.4 fL (ref 80.0–100.0)
Monocytes Absolute: 1 10*3/uL (ref 0.1–1.0)
Monocytes Relative: 10 %
Neutro Abs: 7.4 10*3/uL (ref 1.7–7.7)
Neutrophils Relative %: 80 %
Platelets: 126 10*3/uL — ABNORMAL LOW (ref 150–400)
RBC: 3.05 MIL/uL — ABNORMAL LOW (ref 4.22–5.81)
RDW: 13.6 % (ref 11.5–15.5)
WBC: 9.2 10*3/uL (ref 4.0–10.5)
nRBC: 0 % (ref 0.0–0.2)

## 2020-12-24 LAB — TRIGLYCERIDES: Triglycerides: 64 mg/dL (ref <150)

## 2020-12-24 LAB — VALPROIC ACID LEVEL: Valproic Acid Lvl: 41 ug/mL — ABNORMAL LOW (ref 50.0–100.0)

## 2020-12-24 LAB — HEPARIN LEVEL (UNFRACTIONATED): Heparin Unfractionated: 0.13 IU/mL — ABNORMAL LOW (ref 0.30–0.70)

## 2020-12-24 MED ORDER — PANTOPRAZOLE SODIUM 40 MG PO TBEC
40.0000 mg | DELAYED_RELEASE_TABLET | Freq: Every evening | ORAL | Status: DC
  Administered 2020-12-24: 40 mg via ORAL
  Filled 2020-12-24: qty 1

## 2020-12-24 MED ORDER — VALPROATE SODIUM 100 MG/ML IV SOLN
1500.0000 mg | Freq: Once | INTRAVENOUS | Status: DC
  Filled 2020-12-24: qty 15

## 2020-12-24 MED ORDER — DOCUSATE SODIUM 100 MG PO CAPS: 100.0000 mg | ORAL_CAPSULE | Freq: Two times a day (BID) | ORAL | Status: DC

## 2020-12-24 MED ORDER — DIVALPROEX SODIUM 500 MG PO DR TAB
500.0000 mg | DELAYED_RELEASE_TABLET | Freq: Two times a day (BID) | ORAL | Status: DC
  Administered 2020-12-24 – 2020-12-25 (×2): 500 mg via ORAL
  Filled 2020-12-24 (×3): qty 1

## 2020-12-24 MED ORDER — HEPARIN (PORCINE) 25000 UT/250ML-% IV SOLN
1250.0000 [IU]/h | INTRAVENOUS | Status: DC
  Administered 2020-12-25: 1250 [IU]/h via INTRAVENOUS
  Filled 2020-12-24: qty 250

## 2020-12-24 MED ORDER — AMLODIPINE BESYLATE 5 MG PO TABS
5.0000 mg | ORAL_TABLET | Freq: Every day | ORAL | Status: DC
  Administered 2020-12-24 – 2020-12-25 (×2): 5 mg via ORAL
  Filled 2020-12-24 (×2): qty 1

## 2020-12-24 MED ORDER — FINASTERIDE 5 MG PO TABS
5.0000 mg | ORAL_TABLET | Freq: Every day | ORAL | Status: DC
Start: 2020-12-24 — End: 2020-12-25
  Administered 2020-12-24 – 2020-12-25 (×2): 5 mg via ORAL
  Filled 2020-12-24 (×2): qty 1

## 2020-12-24 MED ORDER — ATORVASTATIN CALCIUM 80 MG PO TABS
80.0000 mg | ORAL_TABLET | Freq: Every day | ORAL | Status: DC
  Administered 2020-12-24 – 2020-12-25 (×2): 80 mg via ORAL
  Filled 2020-12-24 (×2): qty 1

## 2020-12-24 MED ORDER — HYDRALAZINE HCL 20 MG/ML IJ SOLN
5.0000 mg | INTRAMUSCULAR | Status: DC | PRN
  Administered 2020-12-24: 10 mg via INTRAVENOUS
  Filled 2020-12-24: qty 1

## 2020-12-24 MED ORDER — HEPARIN (PORCINE) 25000 UT/250ML-% IV SOLN
1100.0000 [IU]/h | INTRAVENOUS | Status: DC
  Administered 2020-12-24: 1100 [IU]/h via INTRAVENOUS
  Filled 2020-12-24: qty 250

## 2020-12-24 MED ORDER — VALPROATE SODIUM 100 MG/ML IV SOLN
1500.0000 mg | Freq: Once | INTRAVENOUS | Status: AC
  Administered 2020-12-24: 1500 mg via INTRAVENOUS
  Filled 2020-12-24: qty 15

## 2020-12-24 MED ORDER — LORAZEPAM 2 MG/ML IJ SOLN
2.0000 mg | Freq: Once | INTRAMUSCULAR | Status: AC | PRN
  Administered 2020-12-24: 2 mg via INTRAVENOUS
  Filled 2020-12-24: qty 1

## 2020-12-24 NOTE — Progress Notes (Signed)
Patient was transported to CT this AM & back to 4N32 without any complications.

## 2020-12-24 NOTE — Progress Notes (Signed)
Attempted MRI this afternoon.  Despite giving PRN Ativan, patient would not be still or follow directions.

## 2020-12-24 NOTE — Progress Notes (Signed)
ANTICOAGULATION CONSULT NOTE  Pharmacy Consult for heparin Indication: acute bilateral DVT, stroke  Heparin Dosing Weight: 91 kg  Labs: Recent Labs    12/16/2020 0732 12/31/2020 0745 12/21/2020 1037 12/05/2020 2038 12/09/2020 2115 12/24/20 0514  HGB 10.3*  --    < > 9.5* 10.7* 9.9*  HCT 32.0*  --    < > 28.0* 32.9* 30.0*  PLT 147*  --   --   --  137* 126*  CREATININE  --  1.44*  --   --  1.18 1.16   < > = values in this interval not displayed.    Assessment: 73 yom s/p bur hole drainage of complex L chronic SDH last month presenting with acute CVA s/p IR for cerebral arteriogram, emergent mechanical thrombectomy on 3/21. Patient also found to have acute bilateral DVT. PTA apixababn has been held since 11/21/20 with SDH/crani with plan to hold x 6 weeks.  Neurosurgery cleared to begin heparin per stroke protocol on 3/22. Neurology and CCM in agreement. Patient did experience platelet drop to 30s in rehab on heparin SQ with improvement once d/c'd. HIT antibody resulted as negative. Per discussion with CCM, will cautiously start heparin and monitor plt trend for now. Hg low stable 9.9, plt down 137>126 today. No current active bleed issues reported. SDH appears to be almost fully resolved per Neurosurgery.  Goal of Therapy:  Heparin level 0.3-0.5 units/ml, no bolus per stroke protocol Monitor platelets by anticoagulation protocol: Yes   Plan:  No bolus per stroke protocol. Start heparin conservatively at 1100 units/hr Check 8hr heparin level Monitor daily CBC closely, s/sx bleeding Neurosurgery planning repeat CT head when therapeutic on heparin   Arturo Morton, PharmD, BCPS Clinical Pharmacist 12/24/2020 12:52 PM

## 2020-12-24 NOTE — Progress Notes (Signed)
Brief Progress Note  Sats are 100% post extubation on 4 L , RR is 14 and unlabored, in no distress O2 decreased to 2 L [place], sat goal 94%, encouraged further weaning Awake and alert,no aphasia. Speaking in full sentences, MAE x 4, Left  slightly weaker than right upper and lower extremities. Very HOH Passed swallow eval. , diet order placed. Home antihypertensives and prn apresoline ordered to aid in liberation from Cleviprex gtt Heparin gtt  to be started now 13:35 , pharmacy to monitor.   Magdalen Spatz, MSN, AGACNP-BC [place] for personal pager PCCM on call pager [phone] 12/24/2020 1:36 PM

## 2020-12-24 NOTE — Consult Note (Addendum)
NAME:  Cesar [name], MRN:  [id number], DOB:  March 19, 1929, LOS: 1 ADMISSION DATE:  12/12/2020, CONSULTATION DATE:  .12/24/20 REFERRING MD:  Cheral Marker, CHIEF COMPLAINT:  Weakness   History of Present Illness:  87 y.o. M with PMH significant for AAA, stage III CKD, OSA, seizures, OSA, severe AS s/p TAVR 10/22/20, permanent Afib who has recently admitted for TAVR and discharged on [facility] and Eliquis, re-admitted 2/15 with large, chronic subdural hematoma after falls and underwent bur hole drainage with drain placement.  His anti-coagulation was held until 2/21  and he was discharged to rehab 2/28 ( CIR)  and then to home on 3/17.      He then presented to the ED on 3/21 with generalized weakness and fever to 101.9 and was found to have UTI and bilateral DVT's MRI showed possible internal carotid artery occlusion on the right and right MCA CVA.   His initial head CT did not show acute changes, but throughout ED course he became increasingly confused and had slurred speech. Pt. Also has a seizure disorder , EEG did not show seizure activity Depakote was subtherapeutic, and he was loaded.  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   Significant Hospital Events: Including procedures, antibiotic start and stop dates in addition to other pertinent  events   . Admit to hospitalists, worsening neuro status and found to have occluded R ICA, taken to IR and then ICU, PCCM consult  Interim History / Subjective:  Following commands, weaning , on maintenance fluids at 75 and hour. He is moving L arm with drift, L leg is weakest of the four extremites. Remains of cleviprex  At 3 mg for BP > 140. T Max 97.6, WBC is 9.2, HGB 9.9, platelets are 126K Creatinine 1.16, BUN 22, + 577, 1325 of UO last 24 hours.  Objective   Blood pressure (!) 167/77, pulse 75, temperature 97.6 F (36.4 C), temperature source Axillary, resp. rate (!) 23, height 5\' 11"  (1.803 m), weight 91 kg, SpO2 100 %.    Vent Mode: CPAP;PSV FiO2 (%):  [40 %-100 %] 40 % Set Rate:  [18 bmp] 18 bmp Vt Set:  [600 mL] 600 mL PEEP:  [5 cmH20] 5 cmH20 Pressure Support:  [10 cmH20] 10 cmH20 Plateau Pressure:  [16 cmH20-18 cmH20] 16 cmH20   Intake/Output Summary (Last 24 hours) at 12/24/2020 0811 Last data filed at 12/24/2020 0700 Gross per 24 hour  Intake 1957.35 ml  Output 1425 ml  Net 532.35 ml   Filed Weights   12/15/2020 0443 12/31/2020 2010  Weight: 86.2 kg 91 kg   General:  Elderly M, intubated, following commands, in NAD HEENT: MM pink/moist, ETT in place and secure, sclera anicteric  Neuro: pupils equal and reactive, + gag, following commands, MAE x 4, L arm with drift, and L leg weaker of all extremities  CV: s1s2 rrr, no m/r/g PULM:  Bilateral chest excursion, Coarse with rhonchi, weaning on  10/5 with good volumes and RR of 12 GI: soft, bsx4 active , NT, ND Extremities: warm/dry, no edema , brisk capillary refill Skin: no rashes or lesions, pale   Labs/imaging that I havepersonally reviewed   CBC, metabolic panel  3/97>>QBHAL Cx with 90,000 colonies of gram negative rods  Susceptibilities pending>> Currently on Maxipime 3/21>> Blood Cx, pending  Resolved Hospital Problem list     Assessment & Plan:    R ICA occlusion s/p thrombectomy Initially admitted  3/21 for AMS in the setting of recent SDH, increased slurred speech around 0500 3/21 prompting CT Head showing resolving SDH, stable mild mass effect without midline shift; MR Brain with multiple small areas of acute infarct in the R cerebral hemisphere with loss of flow in the R ICA compatible with occlusion; CTA Head/Neck showed R ICA occlusion proximally with moderate/severe stenosis at the origin of the dominant L vertebral artery. S/p IR thrombectomy with complete revascularization of occluded ICA to the terminus and RT MCA x 1 pass. - Neurology following, appreciate assistance, defer anti-platelet and anti-coagulation to neurology team - Frequent neuro checks - SBP goal < 140, continue  Cleviprex, no home HTN meds - Neuroprotective measures: HOB > 30 degrees, normoglycemia, normothermia, electrolytes WNL  Ventilator dependence in the setting of acute stroke/encephalopathy Remains intubated in the setting of AMS/secretions, inability to protect airway - Wean FiO2 for O2 sat > 90% - Daily WUA/SBT - VAP bundle - Pulmonary hygiene - PAD protocol for sedation: Propofol for goal RASS 0 to -2  Sepsis secondary to UTI Admitted with fever to Tmax 101.9, leukocytosis, lactic acidosis; UA with +nitrite + leuks, > 50 RBCs and rare bacteria. UCx pending, recent culture 12/06/20 +E.coli (pansensitive).  Urine Cx + for gram - rods, susceptibilities pending - S/p vanc x 1 - Continue cefepime , will narrow once susceptibilities result - F/u final UCx/BCx - not required pressors  History of left SDH s/p craniotomy  On therapeutic anticoagulation s/p TAVR 10/22/2020 s/p fall with resultant L SDH. Underwent craniotomy with drain placement. Resolving on CT Head/MR Brain 3/21, now 35mm, stable  Seizure disorder Known seizure disorder on Depakote; level on admission slightly subtherapeutic at 44. EEG completed 3/21 with findings c/w mild-moderate diffuse encephalopathy with possible areas of focal neuronal  dysfunction on R, no clear epileptiform abnormalities. - Continue IV valproic acid (1000mg  load, then 500mg  BID)  Severe aortic stenosis s/p TAVR 10/2020 History of severe AS, most recent Echo 11/14/20 (post-TAVR) with LVEF 60-65%; normal LV function, RV moderately enlarged with normal function, LA severely dilated, degenerative mitral valve with mild-mod regurgitation without e/o stenosis, s/p TAVR with trivial aortic regurgitation, no stenosis. - Therapeutic anticoagulation held in the setting of recent SDH with craniotomy - Discussion re: [facility] plan moving forward with neurology  Permanent A-fib BLE DVT Previously on Eliquis, which had been held since 11/21/2020 in the setting of SDH with craniotomy with plan for holding x 6 weeks; cleared for [facility] DVT ppx 11/25/2020. BLE US Dopplers demonstrated acute DVT of the R posterior tibial and peroneal veins and L peroneal veins. CHAD2S2VASc score 4 (4.8-6.7%). [facility] plan pending discussion with family and neurology - consider serial LE dopplers vs IVC filter - Will order an echo  CKD Stage III Baseline sCr 1.3-1.5. - Trend BMP - Monitor I&Os - Avoid nephrotoxic agents as able - Maintain renal perfusion  Physical deconditioning Recently discharged from acute rehab 12/19/2020. - PT/OT/SLP as clinically appropriate  Hyperlipidemia BPH GERD - Resume home Lipitor,  Proscar, Protonix once able to tolerate PO    Best practice (evaluated daily)  Diet:  NPO Pain/Anxiety/Delirium protocol (if indicated): Yes (RASS goal -1,-2) VAP protocol (if indicated): Yes DVT prophylaxis: SCD GI prophylaxis: PPI Glucose control:  SSI Yes Central venous access:  N/A Arterial line:  N/A Foley:  Yes, and it is still needed Mobility:  bed rest  PT consulted: N/A Last date of multidisciplinary goals of care discussion [per primary] Code Status:  full code Disposition: ICU  Labs   CBC: Recent Labs  Lab 12/12/2020 0218 12/22/2020 0732 12/03/2020 1037  01/02/2021 2038 12/07/2020 2115 12/24/20 0514  WBC 15.3* 15.1*  --   --  11.7* 9.2  NEUTROABS 13.1*  --   --   --  9.4* 7.4  HGB 11.7* 10.3* 7.8* 9.5* 10.7* 9.9*  HCT 36.7* 32.0* 23.0* 28.0* 32.9* 30.0*  MCV 101.9* 101.6*  --   --  98.5 98.4  PLT 162 147*  --   --  137* 126*    Basic Metabolic Panel: Recent Labs  Lab 12/18/20 0624 12/15/2020 0218 12/18/2020 0745 12/19/2020 1037 12/29/2020 2038 12/12/2020 2115 12/24/20 0514  NA 138 139 139 141 140 136 139  K 4.8 4.5 4.5 5.8* 4.0 3.9 4.1  CL 105 111 109  --   --  109 110  CO2 27 21* 23  --   --  21* 21*  GLUCOSE 91 124* 112*  --   --  100* 105*  BUN 26* 28* 23  --   --  21 22  CREATININE 1.48* 1.49* 1.44*  --   --  1.18 1.16  CALCIUM 8.6* 8.4* 8.4*  --   --  8.0* 8.1*   GFR: Estimated Creatinine Clearance: 49.8 mL/min (by C-G formula based on SCr of 1.16 mg/dL). Recent Labs  Lab 12/16/2020 0211 12/11/2020 0218 12/03/2020 0732 12/19/2020 2115 12/24/20 0514  WBC  --  15.3* 15.1* 11.7* 9.2  LATICACIDVEN 2.0*  --  1.9  --   --     Liver Function Tests: Recent Labs  Lab 12/16/2020 0218  AST 27  ALT 17  ALKPHOS 42  BILITOT 0.8  PROT 5.9*  ALBUMIN 2.6*   No results for input(s): LIPASE, AMYLASE in the last 168 hours. Recent Labs  Lab 12/18/2020 0732  AMMONIA 14    ABG    Component Value Date/Time   PHART 7.483 (H) 12/14/2020 2038   PCO2ART 34.6 12/18/2020 2038   PO2ART 538 (H) 12/06/2020 2038   HCO3 25.9 12/19/2020 2038   TCO2 27 12/17/2020 2038   ACIDBASEDEF 3.0 (H) 12/11/2020 1037   O2SAT 100.0 12/27/2020 2038     Coagulation Profile: No results for input(s): INR, PROTIME in the last 168 hours.  Cardiac Enzymes: No results for input(s): CKTOTAL, CKMB, CKMBINDEX, TROPONINI in the last 168 hours.  HbA1C: Hgb A1c MFr Bld  Date/Time Value Ref Range Status  01/01/2021 04:25 PM 5.4 4.8 - 5.6 % Final    Comment:    (NOTE) Pre diabetes:          5.7%-6.4%  Diabetes:              >6.4%  Glycemic control for    <7.0% adults with diabetes   10/22/2020 04:56 AM 5.1 4.8 - 5.6 % Final    Comment:    (NOTE) Pre diabetes:          5.7%-6.4%  Diabetes:              >  6.4%  Glycemic control for   <7.0% adults with diabetes     CBG: Recent Labs  Lab 12/19/20 0609 12/24/2020 0713  GLUCAP 98 107*    Social History:   reports that he quit smoking about 68 years ago. His smoking use included cigarettes. He has a 10.00 pack-year smoking history. He has never used smokeless tobacco. He reports previous alcohol use. He reports that he does not use drugs.   Family History:  His family history includes Diabetes in his father; Heart failure in his mother; Stroke in his father.   Allergies Allergies  Allergen Reactions  . Flomax [Tamsulosin Hcl] Other (See Comments)    Not able to urinate  . Levetiracetam Other (See Comments)    Unknown reaction     Home Medications  Prior to Admission medications   Medication Sig Start Date End Date Taking? Authorizing Provider  acetaminophen (TYLENOL) 325 MG tablet Take 2 tablets (650 mg total) by mouth every 6 (six) hours as needed for mild pain (or Fever >/= 101). 12/18/20  Yes Angiulli, Lavon Paganini, PA-C  aspirin EC 81 MG tablet Take 81 mg by mouth daily. Swallow whole.   Yes [provider]  atorvastatin (LIPITOR) 80 MG tablet Take 1 tablet (80 mg total) by mouth daily. 12/18/20  Yes Angiulli, Lavon Paganini, PA-C  b complex vitamins capsule Take 1 capsule by mouth daily.   Yes [provider]  divalproex (DEPAKOTE ER) 500 MG 24 hr tablet Take 2 tablets (1,000 mg total) by mouth at bedtime. 12/18/20  Yes Angiulli, Lavon Paganini, PA-C  docusate sodium (COLACE) 100 MG capsule Take 1 capsule (100 mg total) by mouth 2 (two) times daily. 12/18/20  Yes Angiulli, Lavon Paganini, PA-C  finasteride (PROSCAR) 5 MG tablet Take 1 tablet (5 mg total) by mouth daily. 12/18/20  Yes Angiulli, Lavon Paganini, PA-C  pantoprazole (PROTONIX) 40 MG tablet Take 40 mg by mouth every evening.  12/18/20  Yes [provider]     Critical care time: 33 minutes     CRITICAL CARE Performed by: Magdalen Spatz   Total critical care time: 33 minutes  Critical care time was exclusive of separately billable procedures and treating other patients.  Critical care was necessary to treat or prevent imminent or life-threatening deterioration.  Critical care was time spent personally by me on the following activities: development of treatment plan with patient and/or surrogate as well as nursing, discussions with consultants, evaluation of patient's response to treatment, examination of patient, obtaining history from patient or surrogate, ordering and performing treatments and interventions, ordering and review of laboratory studies, ordering and review of radiographic studies, pulse oximetry and re-evaluation of patient's condition.  Magdalen Spatz, MSN, AGACNP-BC [place] for personal pager If no response to pager , please call [phone] until 7pm After 7:00 pm call Elink  [phone]

## 2020-12-24 NOTE — Progress Notes (Signed)
  Speech Language Pathology    Patient Details Name: Cesar [name] MRN: [id number] DOB: 1929-06-13 Today's Date: 12/24/2020 Time: 0240-9735 SLP Time Calculation (min) (ACUTE ONLY): 6 min   Pt known to this SLP from previous admission in which he demonstrated cognitive impairments following SDH, burr hole 2/22. Seen for cognition in CIR. In comparison his cognition appears somewhat improved from Feb admission. Suspect he is functional in acute care - may need follow up?  Wife is responsible for all ADL's in household.                 [name] 12/24/2020, 2:44 PM   [name].Ed Risk analyst [phone] Office [phone]

## 2020-12-24 NOTE — Progress Notes (Signed)
OT Cancellation Note  Patient Details Name: Cesar [name] MRN: [id number] DOB: Nov 14, 1928   Cancelled Treatment:    Reason Eval/Treat Not Completed: Patient not medically ready. Possibility of patient getting extubated this AM, RN feels better to see patient post extubation. Cesar [name], OTR/L Acute Rehab Services Pager [phone] Office [phone]     Almon Register 12/24/2020, 10:19 AM

## 2020-12-24 NOTE — Procedures (Signed)
Extubation Procedure Note  Patient Details:   Name: Cesar [name] DOB: Nov 10, 1928 MRN: [id number]   Airway Documentation:    Vent end date: 12/24/20 Vent end time: 1035   Evaluation  O2 sats: stable throughout Complications: No apparent complications Patient did tolerate procedure well. Bilateral Breath Sounds: Clear,Diminished   Yes   Patient was extubated to a 4L [place] without any complications, dyspnea or stridor noted. Positive cuff leak prior to extubation.   Suvi Archuletta, Eddie North 12/24/2020, 10:35 AM

## 2020-12-24 NOTE — Progress Notes (Signed)
Pt's foley bag started leaking in MRI.  A new bag was placed and taped with pink tape.

## 2020-12-24 NOTE — Progress Notes (Signed)
Neurosurgery  87 yo M s/p bur hole drainage of complex L chronic SDH last month.  His SDH appears to almost have fully resolved.  At this point, with more recent bilateral DVTs and ICA occlusion s/p thrombectomy, it would be reasonable to anticoagulate with heparin, though I would suggest obtaining another CT head without contrast when he is therapeutic.

## 2020-12-24 NOTE — Evaluation (Signed)
Clinical/Bedside Swallow Evaluation Patient Details  Name: Cesar [name] MRN: [id number] Date of Birth: 12-09-1928  Today's Date: 12/24/2020 Time: SLP Start Time (ACUTE ONLY): 1401 SLP Stop Time (ACUTE ONLY): 1407 SLP Time Calculation (min) (ACUTE ONLY): 6 min  Past Medical History:  Past Medical History:  Diagnosis Date  . AAA (abdominal aortic aneurysm) ([facility])   . CKD (chronic kidney disease), stage III ([facility])    baseline Cr 1.4-1.5 in 2016-2017 by PCP)  . CVA (cerebral infarction)   . Depression   . DVT (deep venous thrombosis) ([place]) 2006  . H/O blood clots   . Hematuria   . HOH (hard of hearing)   . Hypercholesterolemia   . OSA (obstructive sleep apnea)    intol to CPAP  . Permanent atrial fibrillation ([facility])   . Prostate enlargement   . Pulmonary nodules   . 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 Buena Irish and Dr. Roxy Manns  . Thoracic ascending aortic aneurysm ([place])    4.5cm in 11/2018, stable since 2016  . TIA (transient ischemic attack)    Past Surgical History:  Past Surgical History:  Procedure Laterality Date  . BURR HOLE Left 11/21/2020   Procedure: LEFT Side BURR HOLES,;  Surgeon: [name], MD;  Location: [facility];  Service: Neurosurgery;  Laterality: Left;  . EMBOLECTOMY Left 07/02/2016   Procedure: EMBOLECTOMY LEFT FEMORAL ARTERY;  Surgeon: [name], MD;  Location: [facility];  Service: Vascular;  Laterality: Left;  Marland Kitchen MESENTERIC ARTERY BYPASS N/A 08/06/2019   Procedure: SUPERIOR MESENTERIC ARTERY THROMBECTOMY;  Surgeon: [name], MD;  Location: [facility];  Service: Vascular;  Laterality: N/A;  . NM [place]  08/24/2002   markedly positive,subtle lateral ischemia  . PATCH ANGIOPLASTY  08/06/2019   Procedure: Patch Angioplasty of superior messenteric artery with RIGHT femoral vein;  Surgeon: [name], MD;  Location: [place];  Service: Vascular;;   . PROSTATE SURGERY    . RADIOLOGY WITH ANESTHESIA N/A 12/11/2020   Procedure: IR WITH ANESTHESIA;  Surgeon: Radiologist, Medication, MD;  Location: [facility];  Service: Radiology;  Laterality: N/A;  . RIGHT HEART CATH AND CORONARY ANGIOGRAPHY N/A 10/21/2020   Procedure: RIGHT HEART CATH AND CORONARY ANGIOGRAPHY;  Surgeon: [name], MD;  Location: [facility];  Service: Cardiovascular;  Laterality: N/A;  . TEE WITHOUT CARDIOVERSION N/A 10/22/2020   Procedure: TRANSESOPHAGEAL ECHOCARDIOGRAM (TEE);  Surgeon: [name], MD;  Location: [facility];  Service: Open Heart Surgery;  Laterality: N/A;  . TRANSCATHETER AORTIC VALVE REPLACEMENT, TRANSFEMORAL Right 10/22/2020   Procedure: TRANSCATHETER AORTIC VALVE REPLACEMENT, TRANSFEMORAL;  Surgeon: [name], MD;  Location: [facility];  Service: Open Heart Surgery;  Laterality: Right;  . US ECHOCARDIOGRAPHY  01/06/2012   mod. AOV ca+,mild to mod. AS,mod mostly posterior MAC-mild MR   HPI:  87 y.o. recently admitted for TAVR and discharged and re-admitted 2/15 with large, chronic subdural hematoma with bur hole drainage with drain placement.  Discharged to inpatient rehab 2/28 then to home on 3/17. MBS with ST  recommended puree/thin (ill fitting dentures, no aspiration). Presented 3/21 with UTI, possible ICA occlusion became increasingly confused and CTA showed occluded R ICA underwent emergent thrombectomy 3/21 and extubated 3/22.  PMH significant for AAA, stage III CKD, OSA, seizures, OSA, severe AS s/p TAVR 10/22/20,   Assessment / Plan / Recommendation Clinical Impression  Pt  familiar to this therapist from prior admission 2/22 in which his dentures were ill fitting and puree/thin liquids recommended after MBS. There was no aspiration duirng instrumental study and barium pill lodged mid esophagus. During today's assessment pt's dentures donned on arrival and intact and exhibited mastication of solid texture with adequate oral phase.  Consumed water without s/s aspiration and RN reported no difficulties with 3 oz water. Recommend regular texture, thin liquids, pills with thin (whole in applesauce if difficulty). No follow up at this time. SLP Visit Diagnosis: Dysphagia, unspecified (R13.10)    Aspiration Risk  Mild aspiration risk    Diet Recommendation Regular;Thin liquid   Liquid Administration via: Cup;Straw Medication Administration: Whole meds with puree Supervision: Patient able to self feed Compensations: Slow rate;Small sips/bites Postural Changes: Seated upright at 90 degrees    Other  Recommendations Oral Care Recommendations: Oral care BID   Follow up Recommendations        Frequency and Duration            Prognosis        Swallow Study   General HPI: 87 y.o. recently admitted for TAVR and discharged and re-admitted 2/15 with large, chronic subdural hematoma with bur hole drainage with drain placement.  Discharged to inpatient rehab 2/28 then to home on 3/17. MBS with ST  recommended puree/thin (ill fitting dentures, no aspiration). Presented 3/21 with UTI, possible ICA occlusion became increasingly confused and CTA showed occluded R ICA underwent emergent thrombectomy 3/21 and extubated 3/22.  PMH significant for AAA, stage III CKD, OSA, seizures, OSA, severe AS s/p TAVR 10/22/20, Type of Study: Bedside Swallow Evaluation Previous Swallow Assessment: MBS 11/29/20 (see HPI) Diet Prior to this Study: Regular;Thin liquids Temperature Spikes Noted: No Respiratory Status: Nasal cannula History of Recent Intubation: Yes Length of Intubations (days): 1 days Date extubated: 12/24/20 (am) Behavior/Cognition: Cooperative;Pleasant mood (adequately awake, mild drowsiness) Oral Cavity Assessment: Within Functional Limits Oral Care Completed by SLP: No Oral Cavity - Dentition: Dentures, top;Dentures, bottom Vision: Functional for self-feeding Self-Feeding Abilities: Needs assist;Able to feed self Patient  Positioning: Upright in bed Baseline Vocal Quality: Normal Volitional Cough: Strong Volitional Swallow: Able to elicit    Oral/Motor/Sensory Function Overall Oral Motor/Sensory Function: Within functional limits Facial ROM: Within Functional Limits Facial Symmetry: Within Functional Limits Facial Strength: Within Functional Limits Lingual ROM: Within Functional Limits Lingual Symmetry: Within Functional Limits   Ice Chips Ice chips: Not tested   Thin Liquid Thin Liquid: Within functional limits Presentation: Straw Oral Phase Impairments:  (none) Oral Phase Functional Implications:  (none) Pharyngeal  Phase Impairments:  (none)    Nectar Thick Nectar Thick Liquid: Not tested   Honey Thick Honey Thick Liquid: Not tested   Puree Puree: Not tested Oral Phase Functional Implications:  (WNL) Pharyngeal Phase Impairments:  (WNL)   Solid     Solid: Within functional limits      [name] 12/24/2020,2:43 PM Orbie Pyo [place].Ed Risk analyst [phone] Office [phone]

## 2020-12-24 NOTE — Progress Notes (Signed)
OT Cancellation Note  Patient Details Name: Cesar [name] MRN: [id number] DOB: 11-10-1928   Cancelled Treatment:    Reason Eval/Treat Not Completed: Patient not medically ready- patient with BLE DVTs without heparin drip started, per protocol need to wait 24 hours prior to OT evaluation.  OT will hold and see when medically appropriate/able.   Jolaine Artist, OT Acute Rehabilitation Services Pager [phone] Office [phone]   Delight Stare 12/24/2020, 1:29 PM

## 2020-12-24 NOTE — Progress Notes (Signed)
PT Cancellation Note  Patient Details Name: Cesar [name] MRN: [id number] DOB: 1929/08/11   Cancelled Treatment:    Reason Eval/Treat Not Completed: Medical issues which prohibited therapy. Pt with acute bil lower extremity DVTs and have not begun heparin treatment, about to initiate. Will follow-up another day as appropriate.   [name], PT, DPT Acute Rehabilitation Services  Pager: [phone] Office: [place] 12/24/2020, 1:43 PM

## 2020-12-24 NOTE — Progress Notes (Signed)
PT Cancellation Note  Patient Details Name: Cesar [name] MRN: [id number] DOB: 1928/11/10   Cancelled Treatment:    Reason Eval/Treat Not Completed: Medical issues which prohibited therapy. RN requesting PT to check back later in day as pt may be extubated this morning. Will follow-up as time permits.   [name], PT, DPT Acute Rehabilitation Services  Pager: [phone] Office: [place] 12/24/2020, 8:44 AM

## 2020-12-24 NOTE — Discharge Instructions (Signed)
Femoral Site Care This sheet gives you information about how to care for yourself after your procedure. Your health care provider may also give you more specific instructions. If you have problems or questions, contact your health care provider. What can I expect after the procedure? After the procedure, it is common to have:  Bruising that usually fades within 1-2 weeks.  Tenderness at the site. Follow these instructions at home: Wound care 1. Follow instructions from your health care provider about how to take care of your insertion site. Make sure you: ? Wash your hands with soap and water before you change your bandage (dressing). If soap and water are not available, use hand sanitizer. ? Change your dressing as directed- pressure dressing removed 24 hours post-procedure (and switch for bandaid), bandaid removed 72 hours post-procedure 2. Do not take baths, swim, or use a hot tub for 7 days post-procedure. 3. You may shower 48 hours after the procedure or as told by your health care provider. ? Gently wash the site with plain soap and water. ? Pat the area dry with a clean towel. ? Do not rub the site. This may cause bleeding. 4. Check your site every day for signs of infection. Check for: ? Redness, swelling, or pain. ? Fluid or blood. ? Warmth. ? Pus or a bad smell. Activity  Do not stoop, bend, or lift anything that is heavier than 10 lb (4.5 kg) for 2 weeks post-procedure.  Do not drive self for 2 weeks post-procedure. Contact a health care provider if you have:  A fever or chills.  You have redness, swelling, or pain around your insertion site. Get help right away if:  The catheter insertion area swells very fast.  You pass out.  You suddenly start to sweat or your skin gets clammy.  The catheter insertion area is bleeding, and the bleeding does not stop when you hold steady pressure on the area.  The area near or just beyond the catheter insertion site becomes  pale, cool, tingly, or numb. These symptoms may represent a serious problem that is an emergency. Do not wait to see if the symptoms will go away. Get medical help right away. Call your local emergency services (911 in the [place] Do not drive yourself to the hospital.  This information is not intended to replace advice given to you by your health care provider. Make sure you discuss any questions you have with your health care provider. Document Revised: 10/04/2017 Document Reviewed: 10/04/2017 [employer]vier Patient Education  2020 [employer] 

## 2020-12-24 NOTE — Progress Notes (Signed)
Referring Physician(s): Code stroke- Kerney Elbe (neurology)   Supervising Physician: Luanne Bras  Patient Status:  [facility] - In-pt  Chief Complaint: Stroke F/U  Subjective:  History of acute CVA s/p cerebral arteriogram with emergent mechanical thrombectomy of right ICA terminus and right MCA occlusions achieving a TICI 3 revascularization via right femoral approach 12/04/2020 by Dr. Estanislado Pandy. Patient laying in bed intubated without sedation. Eyes open and follows simple commands. Can spontaneously move all extremities. Right femoral puncture site c/d/i.  CT head this AM: 1. Stable non contrast CT appearance of the brain since yesterday. Multifocal chronic ischemia with no acute or evolving infarct by CT. 2. No acute intracranial hemorrhage. Stable mixed density left subdural hematoma.   Allergies: Flomax [tamsulosin hcl] and Levetiracetam  Medications: Prior to Admission medications   Medication Sig Start Date End Date Taking? Authorizing Provider  acetaminophen (TYLENOL) 325 MG tablet Take 2 tablets (650 mg total) by mouth every 6 (six) hours as needed for mild pain (or Fever >/= 101). 12/18/20  Yes Angiulli, Lavon Paganini, PA-C  aspirin EC 81 MG tablet Take 81 mg by mouth daily. Swallow whole.   Yes [provider]  atorvastatin (LIPITOR) 80 MG tablet Take 1 tablet (80 mg total) by mouth daily. 12/18/20  Yes Angiulli, Lavon Paganini, PA-C  b complex vitamins capsule Take 1 capsule by mouth daily.   Yes [provider]  divalproex (DEPAKOTE ER) 500 MG 24 hr tablet Take 2 tablets (1,000 mg total) by mouth at bedtime. 12/18/20  Yes Angiulli, Lavon Paganini, PA-C  docusate sodium (COLACE) 100 MG capsule Take 1 capsule (100 mg total) by mouth 2 (two) times daily. 12/18/20  Yes Angiulli, Lavon Paganini, PA-C  finasteride (PROSCAR) 5 MG tablet Take 1 tablet (5 mg total) by mouth daily. 12/18/20  Yes Angiulli, Lavon Paganini, PA-C  pantoprazole (PROTONIX) 40 MG tablet Take 40 mg by mouth  every evening. 12/18/20  Yes [provider]     Vital Signs: BP 126/63   Pulse (!) 48   Temp 97.6 F (36.4 C) (Axillary)   Resp 16   Ht 5\' 11"  (1.803 m)   Wt 200 lb 9.9 oz (91 kg)   SpO2 100%   BMI 27.98 kg/m   Physical Exam Vitals and nursing note reviewed.  Constitutional:      General: He is not in acute distress.    Comments: Intubated with sedation.  Pulmonary:     Effort: Pulmonary effort is normal. No respiratory distress.     Comments: Intubated with sedation. Skin:    General: Skin is warm and dry.     Comments: Right femoral puncture site soft without active bleeding or hematoma.  Neurological:     Mental Status: He is alert.     Comments: Intubated with sedation. Follows simple commands. PERRL bilaterally. EOMs intact bilaterally without nystagmus or subjective diplopia. Can spontaneously move all extremities. No pronator drift. Distal pulses (DPs) 1+ bilaterally.     Imaging: CT ANGIO HEAD W OR WO CONTRAST  Result Date: 01/02/2021 CLINICAL DATA:  Neuro deficit, acute, stroke suspected. EXAM: CT ANGIOGRAPHY HEAD AND NECK CT PERFUSION BRAIN TECHNIQUE: Multidetector CT imaging of the head and neck was performed using the standard protocol during bolus administration of intravenous contrast. Multiplanar CT image reconstructions and MIPs were obtained to evaluate the vascular anatomy. Carotid stenosis measurements (when applicable) are obtained utilizing NASCET criteria, using the distal internal carotid diameter as the denominator. Multiphase CT imaging of the brain was  performed following IV bolus contrast injection. Subsequent parametric perfusion maps were calculated using RAPID software. CONTRAST:  191mL OMNIPAQUE IOHEXOL 350 MG/ML SOLN COMPARISON:  Noncontrast head CT performed earlier today 12/24/2020. Brain MRI performed earlier today 01/01/2021. MRA head 11/26/2020. FINDINGS: CTA NECK FINDINGS Aortic arch: Standard aortic branching atherosclerotic  plaque within the visualized aortic arch and proximal major branch vessels of the neck. No hemodynamically significant innominate or proximal subclavian artery stenosis. Right carotid system: CCA patent within the neck to the bifurcation. Soft and calcified plaque within the carotid bifurcation and proximal ICA. There is occlusion or near occlusion of the cervical right ICA beginning at the proximal to mid aspect of this vessel. Left carotid system: CCA and ICA patent within the neck. Soft and calcified plaque within the carotid bifurcation and proximal ICA with less than 50% stenosis Vertebral arteries: The right vertebral artery is developmentally diminutive, but patent throughout the neck dominant left vertebral artery, patent within the neck. Moderate/severe stenosis at the origin of the left vertebral artery. Minimal calcified plaque is also present within the proximal V2 segment without stenosis at this site Skeleton: No acute bony abnormality or aggressive osseous lesion. Cervical spondylosis. Other neck: No neck mass or cervical lymphadenopathy. Thyroid unremarkable. Upper chest: No consolidation within the imaged lung apices. Biapical pleuroparenchymal scarring. Partially imaged right pleural effusion. Review of the MIP images confirms the above findings CTA HEAD FINDINGS Anterior circulation: The right ICA remains occluded or near occluded intracranially throughout the siphon region. There is reconstitution of enhancement within the vessel at the level of the supraclinoid segment. The right M1 middle cerebral arteries patent. High-grade focal stenosis within a proximal M2 inferior division branch (series 9, image 25). Atherosclerotic irregularity of additional proximal to mid right M2 MCA branch vessels. Most notably, there is segmental high-grade stenosis within a superior division mid M2 right MCA branch (series 10, image 14). The intracranial left ICA is patent. Calcified plaque within moderate stenosis  of the supraclinoid segment. The left M1 middle cerebral artery is patent. No left M2 proximal branch occlusion or high-grade proximal stenosis is identified. The anterior cerebral arteries are patent. Posterior circulation: The intracranial vertebral arteries are patent. Mild calcified plaque within the intracranial left vertebral artery without stenosis. The posterior cerebral arteries are patent. A left posterior communicating artery is present. 2 mm medially projecting vascular protrusion arising from the supraclinoid right ICA which may reflect an aneurysm, infundibulum or origin of an otherwise occluded right posterior communicating artery. Venous sinuses: Within the limitations of contrast timing, no convincing thrombus. Anatomic variants: As described Review of the MIP images confirms the above findings CT Brain Perfusion Findings: CBF (<30%) Volume: 93mL Perfusion (Tmax>6.0s) volume: 120mL Mismatch Volume: Infinite Infarction Location:None identified These results were called by telephone at the time of interpretation on 12/09/2020 at 5:32 pm to provider ERIC [facility] , who verbally acknowledged these results. IMPRESSION: CTA neck: 1. The cervical right ICA becomes occluded or near occluded at its proximal to mid aspect. The vessel remains occluded or near occluded throughout the remainder of the neck. 2. Left CCA and ICA patent within the neck with less than 50% stenosis. 3. Vertebral arteries patent within the neck bilaterally. 4. Moderate/severe stenosis at the origin of the dominant left vertebral artery. CTA head: 1. The right ICA remains occluded or near occluded intracranially throughout the siphon region. There is reconstitution of enhancement within this vessel at the level of the supraclinoid segment. 2. High-grade focal stenosis within an inferior division  proximal M2 right MCA branch vessel. 3. Atherosclerotic irregularity of additional proximal to mid right M2 MCA branch vessels. Most notably,  there is segmental high-grade stenosis within a superior division mid M2 right MCA branch. 4. 2 mm medially projecting vascular protrusion arising from the supraclinoid right ICA. This may reflect an aneurysm, infundibulum or the origin of an otherwise occluded right posterior communicating artery. CT perfusion head: The perfusion software identifies no core infarct. The small acute infarcts demonstrated on the brain MRI performed earlier today are not detected. The perfusion software identifies a 109 mL region of hypoperfusion, predominantly within the right cerebral hemisphere. Electronically Signed   By: Kellie Simmering DO   On: 12/29/2020 17:48   CT HEAD WO CONTRAST  Result Date: 12/24/2020 CLINICAL DATA:  87 year old male with right ICA occlusion/near occlusion. Tiny right hemisphere infarcts on MRI. Chronic/resolving left subdural hematoma. Status post neurovascular revascularization of the ICA and MCA yesterday. Subsequent encounter. EXAM: CT HEAD WITHOUT CONTRAST TECHNIQUE: Contiguous axial images were obtained from the base of the skull through the vertex without intravenous contrast. COMPARISON:  CT head 0730 hours yesterday. FINDINGS: Brain: Small residual mixed density left subdural hematoma remains stable. No new intracranial hemorrhage identified. Patchy bilateral MCA territory chronic encephalomalacia and extensive bilateral white matter hypodensity. Stable gray-white matter differentiation since yesterday. No acute or evolving infarct identified by CT. Vascular: Calcified atherosclerosis at the skull base. No suspicious intracranial vascular hyperdensity. Skull: Stable.  Previous left frontal burr holes. Sinuses/Orbits: Visualized paranasal sinuses and mastoids are stable and well pneumatized. Other: Scalp and orbits soft tissues are stable. IMPRESSION: 1. Stable non contrast CT appearance of the brain since yesterday. Multifocal chronic ischemia with no acute or evolving infarct by CT. 2. No acute  intracranial hemorrhage. Stable mixed density left subdural hematoma. Electronically Signed   By: Genevie Ann M.D.   On: 12/24/2020 08:18   CT Head Wo Contrast  Result Date: 12/24/2020 CLINICAL DATA:  87 year old male with acute altered mental status, slurred speech and facial droop. Recent fall, subdural hematoma. EXAM: CT HEAD WITHOUT CONTRAST TECHNIQUE: Contiguous axial images were obtained from the base of the skull through the vertex without intravenous contrast. COMPARISON:  Head CT today at 0224 hours. Brain MRI 11/06/2020. FINDINGS: Brain: Residual mixed density left subdural collection, likely with a degree of postoperative dural thickening, is stable and measures 7 mm or smaller at most levels. As before there is no significant midline shift, only mild mass effect on the left hemisphere. Stable ventricle size and configuration. Bilateral MCA territory encephalomalacia, Patchy and confluent bilateral cerebral white matter hypodensity remains stable. Streak artifacts suspected in the pons now. No new intracranial hemorrhage identified. No new cortically based infarct identified. Vascular: Calcified atherosclerosis at the skull base.6 No suspicious intracranial vascular hyperdensity. 666 Skull: Stable left convexity burr holes. No acute osseous abnormality identified. Sinuses/Orbits: Visualized paranasal sinuses and mastoids are stable and well pneumatized. Other: No acute orbit or scalp soft tissue finding. IMPRESSION: 1. Stable since 0224 hours today.  No new intracranial abnormality. 2. Residual mixed density left subdural collection, 7-9 mm. Stable mild mass effect with no midline shift. 3. Chronic ischemic disease in both hemispheres. Electronically Signed   By: Genevie Ann M.D.   On: 12/18/2020 07:59   CT Head Wo Contrast  Result Date: 12/19/2020 CLINICAL DATA:  Fall from bed, recent subdural hematoma EXAM: CT HEAD WITHOUT CONTRAST TECHNIQUE: Contiguous axial images were obtained from the base of the  skull through the vertex  without intravenous contrast. COMPARISON:  CT 11/30/2020, MR 11/26/2020 FINDINGS: Brain: Redemonstration of the mixed age extra-axial blood products across the left frontal convexity which appear decreased in size from the prior exam now measuring up to a maximal thickness of 9 mm, previously 15 mm at a similar level. Resolution of the pneumocephaly seen on the comparison exam. No new sites of hemorrhage. Overlying craniotomy defects are noted with postsurgical change. Stable regions of encephalomalacia in the bilateral frontal lobes. Additional punctate foci of hypoattenuation of bilateral basal ganglia likely reflecting sequela of prior lacunar type infarcts. Findings on a background of diffuse chronic microvascular angiopathy and parenchymal volume loss. No significant residual mass effect or midline shift. Vascular: Atherosclerotic calcification of the carotid siphons and intradural vertebral arteries. No hyperdense vessel. Skull: Postsurgical changes on the left frontal scalp with craniotomy defects likely for evacuation of the previously seen subdural hematoma. Some slightly increased left supraorbital/frontal scalp swelling may be related to new contusive change, correlate for point tenderness. No other sites of significant scalp swelling or hematoma. No calvarial fracture or other acute osseous injury. Sinuses/Orbits: Minimal mural thickening in the paranasal sinuses. No layering air-fluid levels or pneumatized secretions. Bilateral lens extractions and right scleral buckle. Other: None. IMPRESSION: 1. Redemonstration of the attenuation extra-axial blood products across the left frontal convexity which appear decreased in size from the prior exam now measuring up to a maximal thickness of 9 mm, previously 15 mm at a similar level. Resolution of the pneumocephaly seen on the comparison exam. No new sites of hemorrhage. 2. Some slightly increased left supraorbital/frontal scalp  swelling may be related to new contusive change, correlate for point tenderness. No calvarial fracture. 3. Stable regions of encephalomalacia in the bilateral frontal lobes and remote lacunar infarcts in the basal ganglia. 4. Background of diffuse chronic microvascular angiopathy and parenchymal volume loss. 5. Intracranial atherosclerosis. Electronically Signed   By: Lovena Le M.D.   On: 12/13/2020 02:40   CT ANGIO NECK W OR WO CONTRAST  Result Date: 12/27/2020 CLINICAL DATA:  Neuro deficit, acute, stroke suspected. EXAM: CT ANGIOGRAPHY HEAD AND NECK CT PERFUSION BRAIN TECHNIQUE: Multidetector CT imaging of the head and neck was performed using the standard protocol during bolus administration of intravenous contrast. Multiplanar CT image reconstructions and MIPs were obtained to evaluate the vascular anatomy. Carotid stenosis measurements (when applicable) are obtained utilizing NASCET criteria, using the distal internal carotid diameter as the denominator. Multiphase CT imaging of the brain was performed following IV bolus contrast injection. Subsequent parametric perfusion maps were calculated using RAPID software. CONTRAST:  161mL OMNIPAQUE IOHEXOL 350 MG/ML SOLN COMPARISON:  Noncontrast head CT performed earlier today 12/24/2020. Brain MRI performed earlier today 12/20/2020. MRA head 11/26/2020. FINDINGS: CTA NECK FINDINGS Aortic arch: Standard aortic branching atherosclerotic plaque within the visualized aortic arch and proximal major branch vessels of the neck. No hemodynamically significant innominate or proximal subclavian artery stenosis. Right carotid system: CCA patent within the neck to the bifurcation. Soft and calcified plaque within the carotid bifurcation and proximal ICA. There is occlusion or near occlusion of the cervical right ICA beginning at the proximal to mid aspect of this vessel. Left carotid system: CCA and ICA patent within the neck. Soft and calcified plaque within the carotid  bifurcation and proximal ICA with less than 50% stenosis Vertebral arteries: The right vertebral artery is developmentally diminutive, but patent throughout the neck dominant left vertebral artery, patent within the neck. Moderate/severe stenosis at the origin of the left vertebral  artery. Minimal calcified plaque is also present within the proximal V2 segment without stenosis at this site Skeleton: No acute bony abnormality or aggressive osseous lesion. Cervical spondylosis. Other neck: No neck mass or cervical lymphadenopathy. Thyroid unremarkable. Upper chest: No consolidation within the imaged lung apices. Biapical pleuroparenchymal scarring. Partially imaged right pleural effusion. Review of the MIP images confirms the above findings CTA HEAD FINDINGS Anterior circulation: The right ICA remains occluded or near occluded intracranially throughout the siphon region. There is reconstitution of enhancement within the vessel at the level of the supraclinoid segment. The right M1 middle cerebral arteries patent. High-grade focal stenosis within a proximal M2 inferior division branch (series 9, image 25). Atherosclerotic irregularity of additional proximal to mid right M2 MCA branch vessels. Most notably, there is segmental high-grade stenosis within a superior division mid M2 right MCA branch (series 10, image 14). The intracranial left ICA is patent. Calcified plaque within moderate stenosis of the supraclinoid segment. The left M1 middle cerebral artery is patent. No left M2 proximal branch occlusion or high-grade proximal stenosis is identified. The anterior cerebral arteries are patent. Posterior circulation: The intracranial vertebral arteries are patent. Mild calcified plaque within the intracranial left vertebral artery without stenosis. The posterior cerebral arteries are patent. A left posterior communicating artery is present. 2 mm medially projecting vascular protrusion arising from the supraclinoid right  ICA which may reflect an aneurysm, infundibulum or origin of an otherwise occluded right posterior communicating artery. Venous sinuses: Within the limitations of contrast timing, no convincing thrombus. Anatomic variants: As described Review of the MIP images confirms the above findings CT Brain Perfusion Findings: CBF (<30%) Volume: 3mL Perfusion (Tmax>6.0s) volume: 164mL Mismatch Volume: Infinite Infarction Location:None identified These results were called by telephone at the time of interpretation on 12/04/2020 at 5:32 pm to provider ERIC [facility] , who verbally acknowledged these results. IMPRESSION: CTA neck: 1. The cervical right ICA becomes occluded or near occluded at its proximal to mid aspect. The vessel remains occluded or near occluded throughout the remainder of the neck. 2. Left CCA and ICA patent within the neck with less than 50% stenosis. 3. Vertebral arteries patent within the neck bilaterally. 4. Moderate/severe stenosis at the origin of the dominant left vertebral artery. CTA head: 1. The right ICA remains occluded or near occluded intracranially throughout the siphon region. There is reconstitution of enhancement within this vessel at the level of the supraclinoid segment. 2. High-grade focal stenosis within an inferior division proximal M2 right MCA branch vessel. 3. Atherosclerotic irregularity of additional proximal to mid right M2 MCA branch vessels. Most notably, there is segmental high-grade stenosis within a superior division mid M2 right MCA branch. 4. 2 mm medially projecting vascular protrusion arising from the supraclinoid right ICA. This may reflect an aneurysm, infundibulum or the origin of an otherwise occluded right posterior communicating artery. CT perfusion head: The perfusion software identifies no core infarct. The small acute infarcts demonstrated on the brain MRI performed earlier today are not detected. The perfusion software identifies a 109 mL region of hypoperfusion,  predominantly within the right cerebral hemisphere. Electronically Signed   By: Kellie Simmering DO   On: 12/06/2020 17:48   MR BRAIN WO CONTRAST  Result Date: 12/19/2020 CLINICAL DATA:  Mental status change. Burr hole drainage of subdural hematoma 11/21/2020 EXAM: MRI HEAD WITHOUT CONTRAST TECHNIQUE: Multiplanar, multiecho pulse sequences of the brain and surrounding structures were obtained without intravenous contrast. COMPARISON:  MRI head 11/26/2020.  CT head 12/08/2020 FINDINGS:  Brain: Multiple small areas of restricted diffusion compatible with acute infarcts involving the right basal ganglia, right parietal white matter, and right frontal parietal white matter, and right parietal cortex. These were not present on the MRI of 11/27/2019. Previous area of restricted diffusion in the left hippocampus has resolved likely due to recent infarct. Subacute subdural hematoma on the left is unchanged measuring approximately 7 mm in thickness. Ventricle size normal. No midline shift There is generalized atrophy. Moderate white matter changes bilaterally most likely chronic microvascular ischemia Motion degraded study.  Patient terminated the procedure early Vascular: Loss of flow void in the right internal carotid artery compatible with occlusion. This was patent on the prior MRI and MRA. Skull and upper cervical spine: No focal skeletal abnormality. Sinuses/Orbits: Paranasal sinuses clear. Bilateral cataract extraction. Other: None IMPRESSION: Multiple small areas of acute infarct in the right cerebral hemisphere. Interval resolution of small infarct in the left hippocampus. Loss of flow void right internal carotid artery compatible with interval occlusion Subacute subdural hematoma left 7 mm, unchanged. These results were called by telephone at the time of interpretation on 12/10/2020 at 4:26 pm to provider [facility] , who verbally acknowledged these results. Electronically Signed   By: Franchot Gallo M.D.   On:  12/11/2020 16:27   CT CEREBRAL PERFUSION W CONTRAST  Result Date: 12/20/2020 CLINICAL DATA:  Neuro deficit, acute, stroke suspected. EXAM: CT ANGIOGRAPHY HEAD AND NECK CT PERFUSION BRAIN TECHNIQUE: Multidetector CT imaging of the head and neck was performed using the standard protocol during bolus administration of intravenous contrast. Multiplanar CT image reconstructions and MIPs were obtained to evaluate the vascular anatomy. Carotid stenosis measurements (when applicable) are obtained utilizing NASCET criteria, using the distal internal carotid diameter as the denominator. Multiphase CT imaging of the brain was performed following IV bolus contrast injection. Subsequent parametric perfusion maps were calculated using RAPID software. CONTRAST:  169mL OMNIPAQUE IOHEXOL 350 MG/ML SOLN COMPARISON:  Noncontrast head CT performed earlier today 12/24/2020. Brain MRI performed earlier today 12/28/2020. MRA head 11/26/2020. FINDINGS: CTA NECK FINDINGS Aortic arch: Standard aortic branching atherosclerotic plaque within the visualized aortic arch and proximal major branch vessels of the neck. No hemodynamically significant innominate or proximal subclavian artery stenosis. Right carotid system: CCA patent within the neck to the bifurcation. Soft and calcified plaque within the carotid bifurcation and proximal ICA. There is occlusion or near occlusion of the cervical right ICA beginning at the proximal to mid aspect of this vessel. Left carotid system: CCA and ICA patent within the neck. Soft and calcified plaque within the carotid bifurcation and proximal ICA with less than 50% stenosis Vertebral arteries: The right vertebral artery is developmentally diminutive, but patent throughout the neck dominant left vertebral artery, patent within the neck. Moderate/severe stenosis at the origin of the left vertebral artery. Minimal calcified plaque is also present within the proximal V2 segment without stenosis at this site  Skeleton: No acute bony abnormality or aggressive osseous lesion. Cervical spondylosis. Other neck: No neck mass or cervical lymphadenopathy. Thyroid unremarkable. Upper chest: No consolidation within the imaged lung apices. Biapical pleuroparenchymal scarring. Partially imaged right pleural effusion. Review of the MIP images confirms the above findings CTA HEAD FINDINGS Anterior circulation: The right ICA remains occluded or near occluded intracranially throughout the siphon region. There is reconstitution of enhancement within the vessel at the level of the supraclinoid segment. The right M1 middle cerebral arteries patent. High-grade focal stenosis within a proximal M2 inferior division branch (series 9, image  25). Atherosclerotic irregularity of additional proximal to mid right M2 MCA branch vessels. Most notably, there is segmental high-grade stenosis within a superior division mid M2 right MCA branch (series 10, image 14). The intracranial left ICA is patent. Calcified plaque within moderate stenosis of the supraclinoid segment. The left M1 middle cerebral artery is patent. No left M2 proximal branch occlusion or high-grade proximal stenosis is identified. The anterior cerebral arteries are patent. Posterior circulation: The intracranial vertebral arteries are patent. Mild calcified plaque within the intracranial left vertebral artery without stenosis. The posterior cerebral arteries are patent. A left posterior communicating artery is present. 2 mm medially projecting vascular protrusion arising from the supraclinoid right ICA which may reflect an aneurysm, infundibulum or origin of an otherwise occluded right posterior communicating artery. Venous sinuses: Within the limitations of contrast timing, no convincing thrombus. Anatomic variants: As described Review of the MIP images confirms the above findings CT Brain Perfusion Findings: CBF (<30%) Volume: 42mL Perfusion (Tmax>6.0s) volume: 123mL Mismatch Volume:  Infinite Infarction Location:None identified These results were called by telephone at the time of interpretation on 12/19/2020 at 5:32 pm to provider [facility] , who verbally acknowledged these results. IMPRESSION: CTA neck: 1. The cervical right ICA becomes occluded or near occluded at its proximal to mid aspect. The vessel remains occluded or near occluded throughout the remainder of the neck. 2. Left CCA and ICA patent within the neck with less than 50% stenosis. 3. Vertebral arteries patent within the neck bilaterally. 4. Moderate/severe stenosis at the origin of the dominant left vertebral artery. CTA head: 1. The right ICA remains occluded or near occluded intracranially throughout the siphon region. There is reconstitution of enhancement within this vessel at the level of the supraclinoid segment. 2. High-grade focal stenosis within an inferior division proximal M2 right MCA branch vessel. 3. Atherosclerotic irregularity of additional proximal to mid right M2 MCA branch vessels. Most notably, there is segmental high-grade stenosis within a superior division mid M2 right MCA branch. 4. 2 mm medially projecting vascular protrusion arising from the supraclinoid right ICA. This may reflect an aneurysm, infundibulum or the origin of an otherwise occluded right posterior communicating artery. CT perfusion head: The perfusion software identifies no core infarct. The small acute infarcts demonstrated on the brain MRI performed earlier today are not detected. The perfusion software identifies a 109 mL region of hypoperfusion, predominantly within the right cerebral hemisphere. Electronically Signed   By: Kellie Simmering DO   On: 01/02/2021 17:48   DG CHEST PORT 1 VIEW  Result Date: 12/13/2020 CLINICAL DATA:  Intubation EXAM: PORTABLE CHEST 1 VIEW COMPARISON:  None. FINDINGS: Endotracheal tube is 7 cm above the carina. NG tube enters the stomach, the tip projecting off the lower aspect of the image. Heart is  borderline in size. Prior TAVR. Mitral valve annular calcifications. No confluent airspace opacities or effusions. IMPRESSION: Endotracheal tube 7 cm above the carina. No acute cardiopulmonary disease. Electronically Signed   By: Rolm Baptise M.D.   On: 12/03/2020 22:04   DG Chest Port 1 View  Result Date: 12/29/2020 CLINICAL DATA:  87 year old male with sepsis. EXAM: PORTABLE CHEST 1 VIEW COMPARISON:  Chest radiograph dated 11/27/2020. FINDINGS: No focal consolidation, pleural effusion, or pneumothorax. Mild cardiomegaly. Atherosclerotic calcification of the aorta. Aortic valve repair and calcification of the mitral annulus. No acute osseous pathology. IMPRESSION: 1. No acute cardiopulmonary process. 2. Mild cardiomegaly. Electronically Signed   By: Anner Crete M.D.   On: 01/02/2021 02:43  EEG adult  Result Date: 12/14/2020 Raenette Rover, MD     12/11/2020  1:03 PM EEG Report Indication: Altered mental status--known L SDH/craniotomy This study was recorded in the waking/drowsy vs encephalopathic state.  The duration of the study was 28 minutes.  Electrodes were placed according to the International 10/20 system.  Video was reviewed for clinical correlation as needed.  There is some degree of waking organization during periods where there is muscle artifact indicative of a more alert state--there is a discernible though very attenuated anterior - posterior voltage and frequency gradient.  In the occipital leads there is a possible (definitive reactivity is not confirmed) but symmetric posterior dominant rhythm of approximately 7 hertz, which is significantly slower than expected for age, very poorly sustained and indistinct.  Anteriorly, is the expected pattern of faster frequency, lower voltage waveforms though in a significantly reduced amount as compared to normal.  There is intermittent delta slowing in the anterior>posterior leads which is grossly symmetric.  In general, waveforms are slightly  higher in amplitude in the left hemisphere and better defined, although this is most likely related to breach artifact.  In addition, early loss of leads in the right posterior quadrant makes the comparison difficult. During the less alert (no muscle artifact/blink artifiact) there is the disappearance of the gradient, a general shift to slower frequencies, disappearance of the suspected posterior dominant rhythm, but no sleep architecture.  Hyperventilation: deferred Photic stimulation: deferred  There are no clear paroxysmal or epileptiform abnormalities or interhemispheric asymmetries seen during this portion of the recording.  Impression:  This is an abnormal waking and drowsy study due to very attenuated organization as described above.  In addition, there is some degree of focality, with more distinct waveforms/slightly higher amplitudes in the left hemisphere--this is favored to be due to breach artifact however, and early lead loss in the right posterior quadrant makes an exact comparison difficult.  These findings are most consistent with a mild-moderate diffuse encephalopathy, with possible/not definitive increased areas of focal neuronal dysfunction in the right (artifact is more favored) and possibly in the anterior regions (bilaterally).  There are no clear epileptiform abnormalities.  VAS [place] LOWER EXTREMITY VENOUS (DVT)  Result Date: 12/20/2020  Lower Venous DVT Study Indications: Swelling, and Edema.  Limitations: Patient's positioning and altered mental status. Comparison Study: no prior Performing Technologist: Sharion Dove RVS  Examination Guidelines: A complete evaluation includes B-mode imaging, spectral Doppler, color Doppler, and power Doppler as needed of all accessible portions of each vessel. Bilateral testing is considered an integral part of a complete examination. Limited examinations for reoccurring indications may be performed as noted. The reflux portion of the exam is performed  with the patient in reverse Trendelenburg.  +---------+---------------+---------+-----------+----------+-------------------+ RIGHT    CompressibilityPhasicitySpontaneityPropertiesThrombus Aging      +---------+---------------+---------+-----------+----------+-------------------+ CFV      Full                                                             +---------+---------------+---------+-----------+----------+-------------------+ SFJ      Full                                                             +---------+---------------+---------+-----------+----------+-------------------+  FV Prox  Full                                                             +---------+---------------+---------+-----------+----------+-------------------+ FV Mid   Full                                                             +---------+---------------+---------+-----------+----------+-------------------+ FV DistalFull                                                             +---------+---------------+---------+-----------+----------+-------------------+ PFV      Full                                                             +---------+---------------+---------+-----------+----------+-------------------+ POP                                                   patent by color and                                                       Doppler             +---------+---------------+---------+-----------+----------+-------------------+ PTV      None                                         Acute               +---------+---------------+---------+-----------+----------+-------------------+ PERO     None                                         Acute               +---------+---------------+---------+-----------+----------+-------------------+   +---------+---------------+---------+-----------+----------+-------------------+ LEFT      CompressibilityPhasicitySpontaneityPropertiesThrombus Aging      +---------+---------------+---------+-----------+----------+-------------------+ CFV      Full           Yes      Yes                                      +---------+---------------+---------+-----------+----------+-------------------+ SFJ      Full                                                             +---------+---------------+---------+-----------+----------+-------------------+  FV Prox  Full                                                             +---------+---------------+---------+-----------+----------+-------------------+ FV Mid   Full                                                             +---------+---------------+---------+-----------+----------+-------------------+ FV DistalFull                                                             +---------+---------------+---------+-----------+----------+-------------------+ PFV      Full                                                             +---------+---------------+---------+-----------+----------+-------------------+ POP                                                   patent by color and                                                       Doppler             +---------+---------------+---------+-----------+----------+-------------------+ PTV      Full                                                             +---------+---------------+---------+-----------+----------+-------------------+ PERO     None                                         Acute               +---------+---------------+---------+-----------+----------+-------------------+     Summary: RIGHT: - Findings consistent with acute deep vein thrombosis involving the right posterior tibial veins, and right peroneal veins.  LEFT: - Findings consistent with acute deep vein thrombosis involving the left peroneal veins.  *See table(s)  above for measurements and observations. Electronically signed by Servando Snare MD on 12/15/2020 at 10:18:06 PM.    Final     Labs:  CBC: Recent Labs    12/20/2020 5366 12/15/2020 4403 12/07/2020 1037 12/14/2020 2038 12/15/2020 2115  12/24/20 0514  WBC 15.3* 15.1*  --   --  11.7* 9.2  HGB 11.7* 10.3* 7.8* 9.5* 10.7* 9.9*  HCT 36.7* 32.0* 23.0* 28.0* 32.9* 30.0*  PLT 162 147*  --   --  137* 126*    COAGS: Recent Labs    10/19/20 0324 10/19/20 1424 10/22/20 0456 11/19/20 1751  INR  --   --  1.1 1.3*  APTT 123* 108* 35 36    BMP: Recent Labs    08/12/20 1137 10/16/20 1000 10/30/20 1520 11/19/20 1751 12/06/2020 0218 12/29/2020 0745 12/11/2020 1037 12/03/2020 2038 12/29/2020 2115 12/24/20 0514  NA 142   < > 141   < > 139 139 141 140 136 139  K 4.8   < > 4.9   < > 4.5 4.5 5.8* 4.0 3.9 4.1  CL 108*   < > 104   < > 111 109  --   --  109 110  CO2 22   < > 23   < > 21* 23  --   --  21* 21*  GLUCOSE 104*   < > 119*   < > 124* 112*  --   --  100* 105*  BUN 24   < > 28*   < > 28* 23  --   --  21 22  CALCIUM 9.1   < > 8.8   < > 8.4* 8.4*  --   --  8.0* 8.1*  CREATININE 1.35*   < > 1.28*   < > 1.49* 1.44*  --   --  1.18 1.16  GFRNONAA 46*   < > 49*   < > 45* 46*  --   --  59* >60  GFRAA 53*  --  57*  --   --   --   --   --   --   --    < > = values in this interval not displayed.    LIVER FUNCTION TESTS: Recent Labs    11/19/20 1751 12/01/20 0610 12/03/20 0509 12/19/2020 0218  BILITOT 0.9 0.7 1.0 0.8  AST 28 23 23 27   ALT 22 15 17 17   ALKPHOS 59 50 50 42  PROT 6.6 5.7* 6.1* 5.9*  ALBUMIN 3.1* 2.7* 2.8* 2.6*    Assessment and Plan:  History of acute CVA s/p cerebral arteriogram with emergent mechanical thrombectomy of right ICA terminus and right MCA occlusions achieving a TICI 3 revascularization via right femoral approach 12/12/2020 by Dr. Estanislado Pandy. Patient's condition stable- remains intubated without sedation, follows simple commands, can spontaneously move all  extremities. Right femoral puncture site stable, distal pulses (DPs) 1+ bilaterally. For possible extubation today. For MR/MRA brain/head per Dr. Estanislado Pandy. Further plans per neurology/CCM- appreciate and agree with management. NIR to follow.   Electronically Signed: Earley Abide, PA-C 12/24/2020, 10:11 AM   I spent a total of 15 Minutes at the the patient's bedside AND on the patient's hospital floor or unit, greater than 50% of which was counseling/coordinating care for CVA s/p revascularization.

## 2020-12-24 NOTE — Progress Notes (Signed)
ANTICOAGULATION CONSULT NOTE  Pharmacy Consult for heparin Indication: acute bilateral DVT, stroke  Heparin Dosing Weight: 91 kg  Labs: Recent Labs    12/05/2020 0732 12/13/2020 0745 12/20/2020 1037 12/27/2020 2038 01/01/2021 2115 12/24/20 0514 12/24/20 2112  HGB 10.3*  --    < > 9.5* 10.7* 9.9*  --   HCT 32.0*  --    < > 28.0* 32.9* 30.0*  --   PLT 147*  --   --   --  137* 126*  --   HEPARINUNFRC  --   --   --   --   --   --  0.13*  CREATININE  --  1.44*  --   --  1.18 1.16  --    < > = values in this interval not displayed.    Assessment: 87 yom s/p bur hole drainage of complex L chronic SDH last month presenting with acute CVA s/p IR for cerebral arteriogram, emergent mechanical thrombectomy on 3/21. Patient also found to have acute bilateral DVT. PTA apixababn has been held since 11/21/20 with SDH/crani with plan to hold x 6 weeks.  Neurosurgery cleared to begin heparin per stroke protocol on 3/22. Neurology and CCM in agreement. Patient did experience platelet drop to 30s in rehab on heparin SQ with improvement once d/c'd. HIT antibody resulted as negative. Per discussion with CCM, will cautiously start heparin and monitor plt trend for now. Hg low stable 9.9, plt down 137>126 today. No current active bleed issues reported. SDH appears to be almost fully resolved per Neurosurgery.  PM f/u> heparin level 0.13.  No known issues with IV infusion.  No overt bleeding or complications noted.  Goal of Therapy:  Heparin level 0.3-0.5 units/ml, no bolus per stroke protocol Monitor platelets by anticoagulation protocol: Yes   Plan:  No bolus per stroke protocol.  Increase IV heparin to 1250 units/hr. Check 8hr heparin level Monitor daily CBC closely, s/sx bleeding Neurosurgery planning repeat CT head when therapeutic on heparin  [place] Crane, Roylene Reason, [facility] Clinical Pharmacist  12/24/2020 9:53 PM   [facility] pharmacy phone numbers are listed on [email or web address]

## 2020-12-24 NOTE — Progress Notes (Addendum)
STROKE TEAM PROGRESS NOTE   INTERVAL HISTORY  Mr. Cesar [name] was diagnosed with a left subdural hemorrhage on 11/2020 and underwent burr hole drainage.  He now presents with increase confusion and fever. He was noted to have an UTI and met sepsis criteria. Patient was also noted to have acute bilateral lower extremity DVTs on ultrasound performed while in the ED.  He then developed slurred speech and left sided weaknes at 5 AM this morning.  A code stroke was called.  An MRI showed possible internal carotid artery occlusion on the right and right MCA stroke. He underwent thrombectomy of the ICA to the terminus and right MCA achieving a TICI 3 revascularization.  He was transported to ICU intubated.   Vitals:   12/24/20 1300 12/24/20 1340 12/24/20 1400 12/24/20 1500  BP: (!) 116/51 (!) 141/69 127/65 128/62  Pulse: (!) 55  76 77  Resp: 18  (!) 21 (!) 28  Temp:      TempSrc:      SpO2: 100%  100% 99%  Weight:      Height:       CBC:  Recent Labs  Lab 12/09/2020 2115 12/24/20 0514  WBC 11.7* 9.2  NEUTROABS 9.4* 7.4  HGB 10.7* 9.9*  HCT 32.9* 30.0*  MCV 98.5 98.4  PLT 137* 734*   Basic Metabolic Panel:  Recent Labs  Lab 12/22/2020 2115 12/24/20 0514  NA 136 139  K 3.9 4.1  CL 109 110  CO2 21* 21*  GLUCOSE 100* 105*  BUN 21 22  CREATININE 1.18 1.16  CALCIUM 8.0* 8.1*   Lipid Panel:  Recent Labs  Lab 12/04/2020 1625 12/24/20 0514  CHOL 115  --   TRIG 68 64  HDL 41  --   CHOLHDL 2.8  --   VLDL 14  --   LDLCALC 60  --    HgbA1c:  Recent Labs  Lab 12/31/2020 1625  HGBA1C 5.4    IMAGING past 24 hours  CT HEAD WO CONTRAST  Result Date: 12/24/2020 IMPRESSION:  1. Stable non contrast CT appearance of the brain since yesterday. Multifocal chronic ischemia with no acute or evolving infarct by CT.  2. No acute intracranial hemorrhage. Stable mixed density left subdural hematoma.    MR BRAIN WO CONTRAST  Result Date: 01/02/2021 IMPRESSION: Multiple small areas of acute  infarct in the right cerebral hemisphere. Interval resolution of small infarct in the left hippocampus. Loss of flow void right internal carotid artery compatible with interval occlusion Subacute subdural hematoma left 7 mm, unchanged.   CTA head/neck   Result Date: 12/31/2020 IMPRESSION:  CTA neck:  1. The cervical right ICA becomes occluded or near occluded at its proximal to mid aspect. The vessel remains occluded or near occluded throughout the remainder of the neck.  2. Left CCA and ICA patent within the neck with less than 50% stenosis.  3. Vertebral arteries patent within the neck bilaterally.  4. Moderate/severe stenosis at the origin of the dominant left vertebral artery.   CTA head:  1. The right ICA remains occluded or near occluded intracranially throughout the siphon region. There is reconstitution of enhancement within this vessel at the level of the supraclinoid segment.  2. High-grade focal stenosis within an inferior division proximal M2 right MCA branch vessel.  3. Atherosclerotic irregularity of additional proximal to mid right M2 MCA branch vessels. Most notably, there is segmental high-grade stenosis within a superior division mid M2 right MCA branch.  4. 2 mm medially  projecting vascular protrusion arising from the supraclinoid right ICA. This may reflect an aneurysm, infundibulum or the origin of an otherwise occluded right posterior communicating artery.   CEREBRAL PERFUSION W CONTRAST CT perfusion head: The perfusion software identifies no core infarct. The small acute infarcts demonstrated on the brain MRI performed earlier today are not detected. 109 mL region of hypoperfusion, predominantly within the right cerebral hemisphere.  DG CHEST PORT 1 VIEW Result Date: 12/16/2020 IMPRESSION: Endotracheal tube 7 cm above the carina. No acute cardiopulmonary disease.    PHYSICAL EXAM GENERAL: Aroused to tactile and verbal stimuli HEENT: - Normocephalic and atraumatic, dry  mm,  LUNGS - Normal respiratory effort. SaO2 CV - A fib on tele ABDOMEN - Soft, nontender Ext: warm, well perfused  NEURO:  Mental Status: Patient becomes alert with verbal stimuli, but requires some tactile stimulation intermittently to keep awake. Speech/Language: Intubated and sedated.  Cranial Nerves:  II: PERR 2 mm/brisk. Unable to assess visual fields III, IV, VI: Does not follow or track across midline to the left. Gazes to the right. V: Does not blink to threat of the left side VII:  left facial droop. VIII: Hearing intact to voice IX, X: unable to assess due to intubated and sedated XI: No use of the left shoulder or arm. Falls onto his left side. XII: Head turned to the right.  Motor: At least 3/5 in RLE and good grip in the R hand. 2/5 in LUE/ LLE to noxious stimuli Coordination:Unable to assess due to AMS Gait- deferred  ASSESSMENT/PLAN Cesar [name] is a 87 y.o. male with history of A. fib, CVA, seizure disorder, history of DVT, hyperlipidemia, chronic kidney disease stage III, severe AS status post TAVR in January 2022, OSA not on CPAP, nonobstructive CAD presenting with confusion. Was found to have multiple small areas of acute infarct in the right cerebral hemisphere and right ICA and right MCA occlusions.  He underwent emergent mechanical thrombectomy of right ICA terminus and right MCA occlusions achieving a TICI 3 revascularization     Stroke:  right MCA and left CR, left MCA/PCA punctate scattered infarcts with right ICA and proximal MCA occlusions s/p IR with TICI 3 revasculariztion.   Code Stroke CT head no acute intracranial hemorrhage  CTA head right ICA remains occluded, High-grade stenosis of M2 right MCA branch vessel.   CTA neck cervical right ICA becomes occluded or near occluded at its proximal to mid aspect. The vessel remains occluded or near occluded throughout the remainder of the neck. Left CCA and ICA patent within the neck with less than 50%  stenosis.   CT perfusion no core infarct but large penumbra.  CT head repeat no bleeding  MRI  Multiple small areas of acute infarct in the right cerebral hemisphere. As well as left CR and left MCA/PCA punctate infarcts  MRA  pending  2D Echo pending  LDL 60  HgbA1c 5.4  VTE prophylaxis - heparin IV infusion  Diet: heart healthy  aspirin 81 mg daily prior to admission, now on heparin IV.   Therapy recommendations:  pending  Disposition:  pending  Seizure history  11/2020 due to SDH, discharged with depakote 500 bid  This admission Depakote level 44->41  Loaded with Depakote 1549m x1  Continue Depakote 5018mbid  EEG: mild-moderate diffuse encephalopathy, with possible/not definitive increased areas of focal neuronal dysfunction in the right (artifact is more favored) and possibly in the anterior regions (bilaterally).  There are no clear epileptiform abnormalities.  Right ICA and MCA occlusions  CTA Head/Neck showed R ICA occlusion proximally with moderate/severe stenosis at the origin of the dominant L vertebral artery.  S/p IR thrombectomywithTICI3 revascularization of occluded ICA to the terminus and RT MCA x 1 pass  SBP goal 120-140 at 24h post procedure  Bilateral lower extremity DVTs  History of blood clots, prior DVT 2006  This admission LE venous doppler - R posterior tibial and peroneal veins and L peroneal veins DVT.  Heparin infusion per stroke protocol - No bolus per stroke protocol. Goal level Heparin level 0.3-0.5 units/ml  Per NSG, repeat head CT once heparin therapeutic level to evaluate for SDH  Atrial fibrillation  Previously on Coumadin, held due to SDH s/p craniotomy  CHAD2S2VAScscore 4 (4.8-6.7%).  Start Heparin IV infusion  Echo pending  Rate controlled  SDH  11/2020 left SDH status post evacuation  Anticoagulation was on hold, plan to resume in 6 weeks  This admission CT head left SDH much improved  Discussed with  NSG Dr. Venetia Constable, okay to start heparin IV with close monitoring  History of stroke  03/2019 admitted for left-sided weakness, left hemianopia, MRI showed only incidental small left cerebellar infarct.  Diagnosed with right brain TIA.  CT showed no acute finding, but old left MCA infarct.  MCA showed right siphon mild to moderate stenosis.  Carotid Doppler negative.  EF 60 to 65%.  LDL 94, A1c 5.3.  Continued on Coumadin and Lipitor 80  Urosepsis  Febrile on admission 101.9 -> afebrile  WBC 11.7 -> 9.2  UA: +nitrite, +LE, WBC >50  Urine culture: ecoli  Recent ecoli uti (12/06/20)  Received vancomycin IV x1, continue cefepime   Hypertension  Home meds:  Amlodipine 24m daily  BP soft  Off Cleveprex . SBP 120-140 at 24h post procedure .  Hyperlipidemia  Home meds:  Atorvastatin 878mdaily, resumed in hospital  LDL 60, goal < 70  Continue statin at discharge  CKD Stage III  Baseline sCr 1.3-1.5.  Cre now 1.18->1.16  Other Stroke Risk Factors  Advanced Age >/= 6522 Overweight, Body mass index is 27.98 kg/m., recommend weight loss, diet and exercise as appropriate   Hx stroke  Family hx stroke (father stroke)  Coronary artery disease:   Abdominal aortic aneurysm  TAVR 10/2020  Other Active Problems  GERD: protonix  BPH: proscar  Hospital day # 1   ATTENDING NOTE: I reviewed above note and agree with the assessment and plan. Pt was seen and examined.   [age]ear old male with history of A. fib, seizure, DVT, HLD, CKD, TAVR 10/2020, OSA, CAD admitted for confusion, fever, found to have urosepsis and bilateral DVT.  Patient has previous TIA and incidental left cerebellum infarct in 03/2019.  At that time, CT old left MCA infarct, MRA right siphon mild to moderate stenosis, carotid Doppler negative, EF 60 to 65%, LDL 94 and A1c 5.3.  Patient continued on Coumadin and Lipitor 80.  Patient admitted 11/2020 for left large SDH status post bur hole evacuation.   Anticoagulation was on hold.  Patient developed worsening right-sided weakness and aphasia, EEG negative, concerning for seizure activity put on Depakote 1000 mg nightly.  This admission patient admitted for confusion, fever, urosepsis and bilateral DVT.  However patient developed acute worsening of speech and left-sided weakness in the ED.  Code stroke activated, CT showed left SDH much improved, stable bilateral frontal encephalomalacia and BG lacunar infarcts.  MRI showed small multiple punctate scattered infarcts at right  MCA, left CR, left MCA/PCA.  However, found to have an no flow of right ICA.  CT head and neck done showed occlusion of right ICA from bulb to terminal and proximal MCA.  CT perfusion showed large penumbra.  Underwent thrombectomy with TICI3 reperfusion.  CT repeat no bleeding.  On exam, patient awake, alert, just s/p extubation, eyes open, orientated to age, place, time and people. No aphasia, fluent language, following all simple commands, mild dysarthria. Able to name and repeat and read. irregularly irregular heart rate and rhythm. No gaze palsy, tracking bilaterally, visual field full, PERRL. Slight left nasolabial fold flattening. Tongue midline. LUE 4/5 proximal and distal with finger grip. LLE 4/5 proximal and 4-/5 distally. RUE and RLE at least 4+/5.  Sensation symmetrical bilaterally, b/l FTN intact but slow on the left, gait not tested.  Patient stroke likely due to A. fib, DVT not on anticoagulation as well as right ICA occlusion.  Discussed with NSG Dr. Venetia Constable, will start heparin IV for stroke prevention and DVT treatment.  MRI/MRA pending.  2D echo pending.  Patient has seizure history on Depakote, Depakote level still low, will load with Depakote 1.5 g.  Continue Depakote 500 twice daily.  Continue treatment for urosepsis per CCM. Resume lipitor.  PT/OT and speech.  For detailed assessment and plan, please refer to above as I have made changes wherever appropriate.    Rosalin Hawking, MD PhD Stroke Neurology 12/24/2020 6:48 PM  This patient is critically ill due to cerebral infarcts, right ICA occlusion status post thrombectomy, seizure disorder, A. fib, bilateral DVT, urosepsis, chronic SDH and at significant risk of neurological worsening, death form recurrent stroke, hemorrhagic conversion, seizure, heart failure, PE, septic shock. This patient's care requires constant monitoring of vital signs, hemodynamics, respiratory and cardiac monitoring, review of multiple databases, neurological assessment, discussion with family, other specialists and medical decision making of high complexity. I spent 50 minutes of neurocritical care time in the care of this patient. I had long discussion with daughter and wife at bedside, updated pt current condition, treatment plan and potential prognosis, and answered all the questions.  They expressed understanding and appreciation.     To contact Stroke Continuity provider, please refer to [email or web address] After hours, contact General Neurology

## 2020-12-25 ENCOUNTER — Inpatient Hospital Stay (HOSPITAL_COMMUNITY): Payer: Medicare Other

## 2020-12-25 ENCOUNTER — Inpatient Hospital Stay (HOSPITAL_COMMUNITY): Payer: Medicare Other | Admitting: Anesthesiology

## 2020-12-25 ENCOUNTER — Encounter (HOSPITAL_COMMUNITY): Admission: EM | Disposition: E | Payer: Self-pay | Source: Home / Self Care | Attending: Neurology

## 2020-12-25 ENCOUNTER — Other Ambulatory Visit (HOSPITAL_COMMUNITY): Payer: Medicare Other

## 2020-12-25 DIAGNOSIS — N390 Urinary tract infection, site not specified: Secondary | ICD-10-CM | POA: Diagnosis not present

## 2020-12-25 DIAGNOSIS — I63231 Cerebral infarction due to unspecified occlusion or stenosis of right carotid arteries: Secondary | ICD-10-CM | POA: Diagnosis not present

## 2020-12-25 DIAGNOSIS — I6601 Occlusion and stenosis of right middle cerebral artery: Secondary | ICD-10-CM

## 2020-12-25 DIAGNOSIS — R569 Unspecified convulsions: Secondary | ICD-10-CM | POA: Diagnosis not present

## 2020-12-25 DIAGNOSIS — I63411 Cerebral infarction due to embolism of right middle cerebral artery: Secondary | ICD-10-CM | POA: Diagnosis not present

## 2020-12-25 DIAGNOSIS — R319 Hematuria, unspecified: Secondary | ICD-10-CM | POA: Diagnosis not present

## 2020-12-25 HISTORY — PX: IR PERCUTANEOUS ART THROMBECTOMY/INFUSION INTRACRANIAL INC DIAG ANGIO: IMG6087

## 2020-12-25 HISTORY — PX: RADIOLOGY WITH ANESTHESIA: SHX6223

## 2020-12-25 HISTORY — PX: IR CT HEAD LTD: IMG2386

## 2020-12-25 LAB — CBC
HCT: 29.8 % — ABNORMAL LOW (ref 39.0–52.0)
Hemoglobin: 9.7 g/dL — ABNORMAL LOW (ref 13.0–17.0)
MCH: 32.1 pg (ref 26.0–34.0)
MCHC: 32.6 g/dL (ref 30.0–36.0)
MCV: 98.7 fL (ref 80.0–100.0)
Platelets: 129 10*3/uL — ABNORMAL LOW (ref 150–400)
RBC: 3.02 MIL/uL — ABNORMAL LOW (ref 4.22–5.81)
RDW: 13.9 % (ref 11.5–15.5)
WBC: 7 10*3/uL (ref 4.0–10.5)
nRBC: 0 % (ref 0.0–0.2)

## 2020-12-25 LAB — BASIC METABOLIC PANEL
Anion gap: 4 — ABNORMAL LOW (ref 5–15)
BUN: 21 mg/dL (ref 8–23)
CO2: 23 mmol/L (ref 22–32)
Calcium: 7.8 mg/dL — ABNORMAL LOW (ref 8.9–10.3)
Chloride: 109 mmol/L (ref 98–111)
Creatinine, Ser: 1.18 mg/dL (ref 0.61–1.24)
GFR, Estimated: 59 mL/min — ABNORMAL LOW (ref >60)
Glucose, Bld: 93 mg/dL (ref 70–99)
Potassium: 3.9 mmol/L (ref 3.5–5.1)
Sodium: 136 mmol/L (ref 135–145)

## 2020-12-25 LAB — URINE CULTURE: Culture: 90000 — AB

## 2020-12-25 LAB — HEPARIN LEVEL (UNFRACTIONATED)
Heparin Unfractionated: 0.31 IU/mL (ref 0.30–0.70)
Heparin Unfractionated: 0.33 IU/mL (ref 0.30–0.70)

## 2020-12-25 LAB — VALPROIC ACID LEVEL: Valproic Acid Lvl: 65 ug/mL (ref 50.0–100.0)

## 2020-12-25 LAB — MAGNESIUM: Magnesium: 1.9 mg/dL (ref 1.7–2.4)

## 2020-12-25 SURGERY — IR WITH ANESTHESIA
Anesthesia: General

## 2020-12-25 MED ORDER — VERAPAMIL HCL 2.5 MG/ML IV SOLN
INTRAVENOUS | Status: AC
  Filled 2020-12-25: qty 2

## 2020-12-25 MED ORDER — CEFAZOLIN SODIUM-DEXTROSE 2-4 GM/100ML-% IV SOLN
INTRAVENOUS | Status: AC
  Filled 2020-12-25: qty 100

## 2020-12-25 MED ORDER — FENTANYL CITRATE (PF) 250 MCG/5ML IJ SOLN
INTRAMUSCULAR | Status: AC
  Filled 2020-12-25: qty 5

## 2020-12-25 MED ORDER — CANGRELOR TETRASODIUM 50 MG IV SOLR
INTRAVENOUS | Status: AC
  Filled 2020-12-25: qty 50

## 2020-12-25 MED ORDER — ETOMIDATE 2 MG/ML IV SOLN
INTRAVENOUS | Status: DC | PRN
  Administered 2020-12-25: 8 mg via INTRAVENOUS

## 2020-12-25 MED ORDER — CHLORHEXIDINE GLUCONATE CLOTH 2 % EX PADS
6.0000 | MEDICATED_PAD | Freq: Every day | CUTANEOUS | Status: DC
  Administered 2020-12-25 – 2020-12-30 (×6): 6 via TOPICAL

## 2020-12-25 MED ORDER — IOHEXOL 300 MG/ML  SOLN: 50.0000 mL | Freq: Once | INTRAMUSCULAR | Status: DC | PRN

## 2020-12-25 MED ORDER — ASPIRIN 81 MG PO CHEW
CHEWABLE_TABLET | ORAL | Status: AC
  Filled 2020-12-25: qty 1

## 2020-12-25 MED ORDER — PHENYLEPHRINE 40 MCG/ML (10ML) SYRINGE FOR IV PUSH (FOR BLOOD PRESSURE SUPPORT)
PREFILLED_SYRINGE | INTRAVENOUS | Status: DC | PRN
  Administered 2020-12-25 (×2): 80 ug via INTRAVENOUS
  Administered 2020-12-25: 120 ug via INTRAVENOUS
  Administered 2020-12-25: 80 ug via INTRAVENOUS

## 2020-12-25 MED ORDER — FENTANYL CITRATE (PF) 250 MCG/5ML IJ SOLN
INTRAMUSCULAR | Status: DC | PRN
  Administered 2020-12-25: 100 ug via INTRAVENOUS
  Administered 2020-12-25 (×3): 50 ug via INTRAVENOUS

## 2020-12-25 MED ORDER — IOHEXOL 300 MG/ML  SOLN: 150.0000 mL | Freq: Once | INTRAMUSCULAR | Status: DC | PRN

## 2020-12-25 MED ORDER — TIROFIBAN HCL IN NACL 5-0.9 MG/100ML-% IV SOLN
INTRAVENOUS | Status: AC
  Filled 2020-12-25: qty 100

## 2020-12-25 MED ORDER — TICAGRELOR 90 MG PO TABS
ORAL_TABLET | ORAL | Status: AC
  Filled 2020-12-25: qty 2

## 2020-12-25 MED ORDER — CLOPIDOGREL BISULFATE 300 MG PO TABS
ORAL_TABLET | ORAL | Status: AC
  Filled 2020-12-25: qty 1

## 2020-12-25 MED ORDER — ACETAMINOPHEN 650 MG RE SUPP
650.0000 mg | RECTAL | Status: DC | PRN
  Administered 2020-12-27 (×2): 650 mg via RECTAL
  Filled 2020-12-25 (×2): qty 1

## 2020-12-25 MED ORDER — SODIUM CHLORIDE 0.9 % IV SOLN: INTRAVENOUS | Status: DC

## 2020-12-25 MED ORDER — PHENYLEPHRINE HCL-NACL 10-0.9 MG/250ML-% IV SOLN
INTRAVENOUS | Status: DC | PRN
  Administered 2020-12-25 (×2): 50 ug/min via INTRAVENOUS

## 2020-12-25 MED ORDER — CEFAZOLIN SODIUM-DEXTROSE 2-3 GM-%(50ML) IV SOLR
INTRAVENOUS | Status: DC | PRN
  Administered 2020-12-25: 2 g via INTRAVENOUS

## 2020-12-25 MED ORDER — NITROGLYCERIN 1 MG/10 ML FOR IR/CATH LAB
INTRA_ARTERIAL | Status: AC
  Administered 2020-12-25: 25 ug via INTRA_ARTERIAL
  Filled 2020-12-25: qty 10

## 2020-12-25 MED ORDER — EPTIFIBATIDE 20 MG/10ML IV SOLN
INTRAVENOUS | Status: AC
  Filled 2020-12-25: qty 10

## 2020-12-25 MED ORDER — ROCURONIUM BROMIDE 100 MG/10ML IV SOLN
INTRAVENOUS | Status: DC | PRN
  Administered 2020-12-25: 20 mg via INTRAVENOUS
  Administered 2020-12-25: 30 mg via INTRAVENOUS

## 2020-12-25 MED ORDER — FENTANYL CITRATE (PF) 100 MCG/2ML IJ SOLN
25.0000 ug | INTRAMUSCULAR | Status: DC | PRN
  Administered 2020-12-25 – 2020-12-26 (×7): 50 ug via INTRAVENOUS
  Filled 2020-12-25 (×4): qty 2

## 2020-12-25 MED ORDER — IOHEXOL 350 MG/ML SOLN
100.0000 mL | Freq: Once | INTRAVENOUS | Status: AC | PRN
  Administered 2020-12-25: 100 mL via INTRAVENOUS

## 2020-12-25 MED ORDER — SODIUM CHLORIDE 0.9 % IV SOLN
1.0000 g | INTRAVENOUS | Status: DC
  Filled 2020-12-25: qty 10

## 2020-12-25 MED ORDER — CHLORHEXIDINE GLUCONATE 0.12% ORAL RINSE (MEDLINE KIT)
15.0000 mL | Freq: Two times a day (BID) | OROMUCOSAL | Status: DC
  Administered 2020-12-25 – 2020-12-26 (×2): 15 mL via OROMUCOSAL

## 2020-12-25 MED ORDER — ACETAMINOPHEN 325 MG PO TABS: 650.0000 mg | ORAL_TABLET | ORAL | Status: DC | PRN

## 2020-12-25 MED ORDER — DOCUSATE SODIUM 50 MG/5ML PO LIQD
100.0000 mg | Freq: Two times a day (BID) | ORAL | Status: DC
  Administered 2020-12-27 – 2020-12-28 (×2): 100 mg
  Filled 2020-12-25 (×3): qty 10

## 2020-12-25 MED ORDER — SODIUM CHLORIDE 0.9 % IV SOLN: INTRAVENOUS | Status: DC | PRN

## 2020-12-25 MED ORDER — PROPOFOL 500 MG/50ML IV EMUL
INTRAVENOUS | Status: DC | PRN
  Administered 2020-12-25: 50 ug/kg/min via INTRAVENOUS

## 2020-12-25 MED ORDER — SUCCINYLCHOLINE CHLORIDE 20 MG/ML IJ SOLN
INTRAMUSCULAR | Status: DC | PRN
  Administered 2020-12-25: 100 mg via INTRAVENOUS

## 2020-12-25 MED ORDER — ORAL CARE MOUTH RINSE
15.0000 mL | OROMUCOSAL | Status: DC
  Administered 2020-12-25 – 2020-12-26 (×8): 15 mL via OROMUCOSAL

## 2020-12-25 MED ORDER — LACTATED RINGERS IV SOLN: INTRAVENOUS | Status: DC | PRN

## 2020-12-25 MED ORDER — POLYETHYLENE GLYCOL 3350 17 G PO PACK
17.0000 g | PACK | Freq: Every day | ORAL | Status: DC
  Filled 2020-12-25 (×2): qty 1

## 2020-12-25 MED ORDER — PROPOFOL 1000 MG/100ML IV EMUL
0.0000 ug/kg/min | INTRAVENOUS | Status: DC
  Administered 2020-12-26: 10 ug/kg/min via INTRAVENOUS
  Filled 2020-12-25: qty 100

## 2020-12-25 MED ORDER — ACETAMINOPHEN 160 MG/5ML PO SOLN
650.0000 mg | ORAL | Status: DC | PRN
  Administered 2020-12-28 (×2): 650 mg
  Filled 2020-12-25 (×3): qty 20.3

## 2020-12-25 MED ORDER — IOHEXOL 240 MG/ML SOLN
INTRAMUSCULAR | Status: AC
  Filled 2020-12-25: qty 200

## 2020-12-25 MED ORDER — CLEVIDIPINE BUTYRATE 0.5 MG/ML IV EMUL
0.0000 mg/h | INTRAVENOUS | Status: AC
  Administered 2020-12-25: 5 mg/h via INTRAVENOUS
  Administered 2020-12-26: 15 mg/h via INTRAVENOUS
  Administered 2020-12-26: 10 mg/h via INTRAVENOUS
  Administered 2020-12-26: 4 mg/h via INTRAVENOUS
  Administered 2020-12-26: 2 mg/h via INTRAVENOUS
  Filled 2020-12-25 (×5): qty 50

## 2020-12-25 MED ORDER — FENTANYL CITRATE (PF) 100 MCG/2ML IJ SOLN
25.0000 ug | INTRAMUSCULAR | Status: DC | PRN
Start: 2020-12-25 — End: 2020-12-26

## 2020-12-25 MED ORDER — LIDOCAINE HCL (CARDIAC) PF 100 MG/5ML IV SOSY
PREFILLED_SYRINGE | INTRAVENOUS | Status: DC | PRN
  Administered 2020-12-25: 80 mg via INTRATRACHEAL

## 2020-12-25 NOTE — Anesthesia Procedure Notes (Addendum)
Arterial Line Insertion Start/End03/10/2020 5:42 PM, 12/08/2020 5:47 PM Performed by: Suzette Battiest, MD, anesthesiologist  Patient location: Pre-op. Preanesthetic checklist: patient identified, IV checked, site marked, risks and benefits discussed, surgical consent, monitors and equipment checked, pre-op evaluation, timeout performed and anesthesia consent Lidocaine 1% used for infiltration radial was placed Catheter size: 20 Fr Hand hygiene performed  and maximum sterile barriers used   Attempts: 1 Procedure performed without using ultrasound guided technique. Following insertion, dressing applied and Biopatch. Post procedure assessment: normal and unchanged  Patient tolerated the procedure well with no immediate complications.

## 2020-12-25 NOTE — Procedures (Signed)
S/P RT common carotid artreriogram  RT CFA approach  S/P complete revascularization of occluded RT MCA inf division with x 1 pass with 6.24mm x 64  Mm embotrap retriever with aspiration achieving a TICI 3 revascularization. Post CT No ICH or mass effect.  35F angioseal; for hemostasis in the rt CFA puncture site. Distal pulses all dopplerable. Left intubated to protect airway. S.Kielan Dreisbach MD

## 2020-12-25 NOTE — Progress Notes (Signed)
ANTICOAGULATION CONSULT NOTE  Pharmacy Consult for heparin Indication: acute bilateral DVT, stroke  Heparin Dosing Weight: 91 kg  Labs: Recent Labs    12/29/2020 2115 12/24/20 0514 12/24/20 2112 12/15/2020 0523  HGB 10.7* 9.9*  --  9.7*  HCT 32.9* 30.0*  --  29.8*  PLT 137* 126*  --  129*  HEPARINUNFRC  --   --  0.13* 0.31  CREATININE 1.18 1.16  --  1.18    Assessment: 89 yom s/p bur hole drainage of complex L chronic SDH last month presenting with acute CVA s/p IR for cerebral arteriogram, emergent mechanical thrombectomy on 3/21. Patient also found to have acute bilateral DVT. PTA apixababn has been held since 11/21/20 with SDH/crani with plan to hold x 6 weeks.  Neurosurgery cleared to begin heparin per stroke protocol on 3/22. Neurology and CCM in agreement. Patient did experience platelet drop to 30s in rehab on heparin SQ with improvement once d/c'd. HIT antibody resulted as negative. Per discussion with CCM, will cautiously start heparin and monitor plt trend for now. SDH appears to be almost fully resolved per Neurosurgery.  Heparin level therapeutic after rate increase last night. CBC stable. No issues with infusion or bleeding reported.  Goal of Therapy:  Heparin level 0.3-0.5 units/ml, no bolus per stroke protocol Monitor platelets by anticoagulation protocol: Yes   Plan:  Continue IV heparin at 1250 units/hr. Check 8hr heparin level to confirm Monitor daily CBC closely, s/sx bleeding Neurosurgery planning repeat CT head when therapeutic on heparin   Arturo Morton, PharmD, BCPS Please check AMION for all [facility] contact numbers Clinical Pharmacist 12/11/2020 10:50 AM

## 2020-12-25 NOTE — Anesthesia Postprocedure Evaluation (Signed)
Anesthesia Post Note  Patient: Cesar [name]  Procedure(s) Performed: IR WITH ANESTHESIA - CODE STROKE (N/A )     Patient location during evaluation: SICU Anesthesia Type: General Level of consciousness: sedated Pain management: pain level controlled Vital Signs Assessment: post-procedure vital signs reviewed and stable Respiratory status: patient remains intubated per anesthesia plan Cardiovascular status: stable Postop Assessment: no apparent nausea or vomiting Anesthetic complications: no   No complications documented.  Last Vitals:  Vitals:   12/24/2020 1200 12/04/2020 1600  BP: (!) 156/71 (!) 159/94  Pulse: 62 (!) 121  Resp: 17 (!) 24  Temp:  36.6 C  SpO2: 99% 99%    Last Pain:  Vitals:   12/17/2020 1600  TempSrc: Axillary  PainSc:                  Vallie Teters

## 2020-12-25 NOTE — Progress Notes (Signed)
NAME:  Cesar [name], MRN:  [id number], DOB:  [birth year], LOS: 2 ADMISSION DATE:  01/02/2021, CONSULTATION DATE:  .01/01/2021 REFERRING MD:  Cheral Marker, CHIEF COMPLAINT:  Weakness   History of Present Illness:  87 y.o. M with PMH significant for AAA, stage III CKD, OSA, seizures, OSA, severe AS s/p TAVR 10/22/20, permanent Afib who has recently admitted for TAVR and discharged on [place] and Eliquis, re-admitted 2/15 with large, chronic subdural hematoma after falls and underwent bur hole drainage with drain placement.  His anti-coagulation was held until 2/21  and he was discharged to rehab 2/28 ( [facility])  and then to home on 3/17.      He then presented to the ED on 3/21 with generalized weakness and fever to 101.9 and was found to have UTI and bilateral DVT's MRI showed possible internal carotid artery occlusion on the right and right MCA CVA.   His initial head CT did not show acute changes, but throughout ED course he became increasingly confused and had slurred speech. Pt. Also has a seizure disorder , EEG did not show seizure activity Depakote was subtherapeutic, and he was loaded.  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   Significant Hospital Events: Including procedures, antibiotic start and stop dates in addition to other pertinent  events   . Admit to hospitalists, worsening neuro status and found to have occluded R ICA, taken to IR and then ICU, PCCM consult . 3/22 extubated. Cefepime for e coli uti  . 3/23 remains stable following extubation, not on any vasoactive infusions. PCCM to sign off.   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 rate (!) 21, height 5\' 11"  (1.803 m), weight 91 kg, SpO2 96 %.    Vent Mode: CPAP;PSV FiO2 (%):  [40 %] 40 % PEEP:  [5 cmH20] 5 cmH20 Pressure Support:  [10 cmH20] 10 cmH20   Intake/Output Summary (Last 24 hours) at 12/08/2020 0727 Last data filed at 01/01/2021 0700 Gross per 24 hour  Intake 1971.44 ml  Output 1750 ml  Net 221.44 ml   Filed Weights   12/19/2020 0443 12/11/2020 2010  Weight: 86.2 kg 91 kg   General:  Elderly M, reclined in bed NAD  HEENT: NCAT Pink mm anicteric sclera  Neuro: Awake, alert, oriented x2. Following commands.  CV: rrr s1s2 no rgm  PULM:  Symmetrical chest expansion, even and unlabored on RA. CTAb GI: soft flat ndnt  Extremities: no acute deformity, no cyanosis or clubbing  Skin: pale, c/d/w/i  Labs/imaging that I havepersonally reviewed    3/21>>Urine Cx Ecoli 90k  3/21>> Blood Cx, pending  3/23 BMP CBC -- grossly WNL -- hgb stable at 9.7   Resolved Hospital Problem list  Respiratory failure requiring intubation   Assessment & Plan:    R ICU occlusion, s/p thrombectomy Initially admitted 3/21 for AMS in the setting of recent SDH, increased slurred speech around 0500 3/21 prompting CT Head showing resolving SDH, stable mild mass effect without midline shift; MR Brain with multiple small areas of acute infarct in the R cerebral hemisphere with loss of flow in the R ICA compatible with occlusion; CTA Head/Neck showed R ICA occlusion  proximally with moderate/severe stenosis at the origin of the dominant L vertebral artery. S/p IR thrombectomy with complete revascularization of occluded ICA to the terminus and RT MCA x 1 pass Recent L SDH s/p burr hole (11/2020)  -stable on MRI  Seizure disorder  -vpa levels a little sub therapeutic  P - CVA per neuro  - heparin gtt   - Cont VPA -- dosing per neuro, trend levels   E.Coli UTI  Admitted with fever to Tmax 101.9, leukocytosis, lactic acidosis; UA with +nitrite + leuks, > 50 RBCs and rare bacteria. UCx pending, recent culture 12/06/20 +E.coli (pansensitive).  Urine Cx + for gram - rods, susceptibilities pending P - cont cefepime   Severe aortic stenosis s/p TAVR 10/2020 Atrial Fibrillation, chronic  -previously on eliquis, had been held since mid Feb 2022 in setting of SDH  BLE DVT  CHAD2S2VASc score 4 (4.8-6.7%). P - cardiac monitoring - Heparin gtt  - consider serial LE dopplers vs IVC filter -ECHO pendong   CKD III  Baseline sCr 1.3-1.5. P -renal indices stable, would recommend spacing out BMPs to qOD  - Avoid nephrotoxic agents as able  HLD -lipitor   BPH, acute urinary retention  -proscar (home med)  -dc foley   GERD -Protonix   Physical deconditioning  Recently discharged from acute rehab 12/19/2020. - PT/OT/SLP as clinically appropriate    Thank you for consulting PCCM. Discussed with primary service, we will sign off. Please re-engage if clinical status changes or if we can be of further assistance.   Best practice (evaluated daily)  Diet:  Oral - Thin liquid. Whole meds w puree  Pain/Anxiety/Delirium protocol (if indicated): No VAP protocol (if indicated): Not indicated DVT prophylaxis: SCD and Systemic AC GI prophylaxis: PPI Glucose control:  SSI Yes Central venous access:  N/A Arterial line:  N/A Foley:  Yes, and it is still needed -- will trial dc after able to have home proscar for BPH/retention  Mobility:  bed rest  PT consulted:  Yes Last date of multidisciplinary goals of care discussion [per primary] Code Status:  full code Disposition: Currently in ICU -- suspect stable for SDU   Labs   CBC: Recent Labs  Lab 12/31/2020 0218 12/15/2020 0732 01/02/2021 1037 12/18/2020 2038 12/04/2020 2115 12/24/20 0514 12/18/2020 0523  WBC 15.3* 15.1*  --   --  11.7* 9.2 7.0  NEUTROABS 13.1*  --   --   --  9.4* 7.4  --   HGB 11.7* 10.3* 7.8* 9.5* 10.7* 9.9* 9.7*  HCT 36.7* 32.0* 23.0* 28.0* 32.9* 30.0* 29.8*  MCV 101.9* 101.6*  --   --  98.5 98.4 98.7  PLT 162 147*  --   --  137* 126* 129*    Basic Metabolic Panel: Recent Labs  Lab 12/18/2020 0218 12/29/2020 0745 12/20/2020 1037 12/10/2020 2038 12/29/2020 2115 12/24/20 0514 12/07/2020 0523  NA 139 139 141 140 136 139 136  K 4.5 4.5 5.8* 4.0 3.9 4.1 3.9  CL 111 109  --   --  109 110  109  CO2 21* 23  --   --  21* 21* 23  GLUCOSE 124* 112*  --   --  100* 105* 93  BUN 28* 23  --   --  21 22 21   CREATININE 1.49* 1.44*  --   --  1.18 1.16 1.18  CALCIUM 8.4* 8.4*  --   --  8.0* 8.1* 7.8*  MG  --   --   --   --   --   --  1.9   GFR: Estimated Creatinine Clearance: 49 mL/min (by C-G formula based on SCr of 1.18 mg/dL). Recent Labs  Lab 12/28/2020 0211 12/12/2020 0218 12/28/2020 0732 12/15/2020 2115 12/24/20 0514 12/07/2020 0523  WBC  --    < > 15.1* 11.7* 9.2 7.0  LATICACIDVEN 2.0*  --  1.9  --   --   --    < > = values in this interval not displayed.    Liver Function Tests: Recent Labs  Lab 12/17/2020 0218  AST 27  ALT 17  ALKPHOS 42  BILITOT 0.8  PROT 5.9*  ALBUMIN 2.6*   No results for input(s): LIPASE, AMYLASE in the last 168 hours. Recent Labs  Lab 12/17/2020 0732  AMMONIA 14    ABG    Component Value Date/Time   PHART 7.483 (H) 12/05/2020 2038   PCO2ART 34.6 12/07/2020 2038   PO2ART 538 (H) 12/31/2020 2038   HCO3 25.9 12/29/2020 2038   TCO2 27 12/13/2020 2038   ACIDBASEDEF 3.0 (H) 12/14/2020 1037   O2SAT 100.0 12/20/2020 2038     Coagulation Profile: No  results for input(s): INR, PROTIME in the last 168 hours.  Cardiac Enzymes: No results for input(s): CKTOTAL, CKMB, CKMBINDEX, TROPONINI in the last 168 hours.  HbA1C: Hgb A1c MFr Bld  Date/Time Value Ref Range Status  12/27/2020 04:25 PM 5.4 4.8 - 5.6 % Final    Comment:    (NOTE) Pre diabetes:          5.7%-6.4%  Diabetes:              >6.4%  Glycemic control for   <7.0% adults with diabetes   10/22/2020 04:56 AM 5.1 4.8 - 5.6 % Final    Comment:    (NOTE) Pre diabetes:          5.7%-6.4%  Diabetes:              >6.4%  Glycemic control for   <7.0% adults with diabetes     CBG: Recent Labs  Lab 12/19/20 0609 12/20/2020 0713  GLUCAP 98 107*    CRITICAL CARE Performed by: Cristal Generous   Total critical care time: [place] MSN, AGACNP-BC [place] [phone] If no answer, [phone] 12/27/2020, 9:35 AM

## 2020-12-25 NOTE — Progress Notes (Addendum)
STROKE TEAM PROGRESS NOTE    HPI: Patient has previous TIA and incidental left cerebellum infarct in 03/2019.  At that time, CT old left MCA infarct, MRA right siphon mild to moderate stenosis, carotid Doppler negative, EF 60 to 65%, LDL 94 and A1c 5.3.  Patient continued on Coumadin and Lipitor 80.  Patient admitted 11/2020 for left large SDH status post burr hole evacuation.  Anticoagulation was on hold.  Patient developed worsening right-sided weakness and aphasia, EEG negative, concerning for seizure activity put on Depakote 1000 mg nightly.  This admission patient admitted for confusion, fever, urosepsis and bilateral DVT.  However patient developed acute worsening of speech and left-sided weakness in the ED.  Code stroke activated, CT showed left SDH much improved, stable bilateral frontal encephalomalacia and BG lacunar infarcts.  MRI showed small multiple punctate scattered infarcts at right MCA, left CR, left MCA/PCA.  However, found to have an no flow of right ICA.  CT head and neck done showed occlusion of right ICA from bulb to terminal and proximal MCA.  CT perfusion showed large penumbra.  Underwent thrombectomy with TICI3 reperfusion.  CT repeat no bleeding.   INTERVAL HISTORY Heparin gtt initiated 3/22, monitoring for HIT. Pharmacy following. hbg stable at 9.7, plt stable at 129 A line removed due to inaccuracy. Urosepsis on Maxipime, remains with foley catheter since 3/22 Depakote subtherapeutic on admission, improving.  Subjectively patient reports he is doing "alright" today without new complaints or concerns. He denies headache.  Family at bedside. Discussed plan of care and ongoing work up. Plan to transfer to floor. Discussed with critical care NP.    Vitals:   12/28/2020 0400 12/11/2020 0500 12/21/2020 0600 12/11/2020 0700  BP: (!) 133/91 129/70 127/70   Pulse: (!) 41 (!) 55 (!) 56 71  Resp: (!) 23 19 (!) 22 (!) 21  Temp: 97.8 F (36.6 C)     TempSrc: Axillary     SpO2: 100%  99% 100% 96%  Weight:      Height:       CBC:  Recent Labs  Lab 12/29/2020 2115 12/24/20 0514 12/15/2020 0523  WBC 11.7* 9.2 7.0  NEUTROABS 9.4* 7.4  --   HGB 10.7* 9.9* 9.7*  HCT 32.9* 30.0* 29.8*  MCV 98.5 98.4 98.7  PLT 137* 126* 962*   Basic Metabolic Panel:  Recent Labs  Lab 12/24/20 0514 12/08/2020 0523  NA 139 136  K 4.1 3.9  CL 110 109  CO2 21* 23  GLUCOSE 105* 93  BUN 22 21  CREATININE 1.16 1.18  CALCIUM 8.1* 7.8*  MG  --  1.9   Lipid Panel:  Recent Labs  Lab 12/04/2020 1625 12/24/20 0514  CHOL 115  --   TRIG 68 64  HDL 41  --   CHOLHDL 2.8  --   VLDL 14  --   LDLCALC 60  --    HgbA1c:  Recent Labs  Lab 12/12/2020 1625  HGBA1C 5.4    IMAGING past 24 hours  CT HEAD WO CONTRAST  Result Date: 12/24/2020 IMPRESSION:  1. Stable non contrast CT appearance of the brain since yesterday. Multifocal chronic ischemia with no acute or evolving infarct by CT.  2. No acute intracranial hemorrhage. Stable mixed density left subdural hematoma.    MR BRAIN WO CONTRAST  Result Date: 12/06/2020 IMPRESSION: Multiple small areas of acute infarct in the right cerebral hemisphere. Interval resolution of small infarct in the left hippocampus. Loss of flow void right internal carotid  artery compatible with interval occlusion Subacute subdural hematoma left 7 mm, unchanged.   CTA head/neck   Result Date: 12/24/2020 IMPRESSION:  CTA neck:  1. The cervical right ICA becomes occluded or near occluded at its proximal to mid aspect. The vessel remains occluded or near occluded throughout the remainder of the neck.  2. Left CCA and ICA patent within the neck with less than 50% stenosis.  3. Vertebral arteries patent within the neck bilaterally.  4. Moderate/severe stenosis at the origin of the dominant left vertebral artery.   CTA head:  1. The right ICA remains occluded or near occluded intracranially throughout the siphon region. There is reconstitution of enhancement  within this vessel at the level of the supraclinoid segment.  2. High-grade focal stenosis within an inferior division proximal M2 right MCA branch vessel.  3. Atherosclerotic irregularity of additional proximal to mid right M2 MCA branch vessels. Most notably, there is segmental high-grade stenosis within a superior division mid M2 right MCA branch.  4. 2 mm medially projecting vascular protrusion arising from the supraclinoid right ICA. This may reflect an aneurysm, infundibulum or the origin of an otherwise occluded right posterior communicating artery.   CEREBRAL PERFUSION W CONTRAST CT perfusion head: The perfusion software identifies no core infarct. The small acute infarcts demonstrated on the brain MRI performed earlier today are not detected. 109 mL region of hypoperfusion, predominantly within the right cerebral hemisphere.  DG CHEST PORT 1 VIEW Result Date: 01/01/2021 IMPRESSION: Endotracheal tube 7 cm above the carina. No acute cardiopulmonary disease.    PHYSICAL EXAM GENERAL: Aroused to tactile and verbal stimuli HEENT: - Normocephalic and atraumatic, dry mm,  LUNGS - Normal respiratory effort. SaO2 CV - A fib on tele ABDOMEN - Soft, nontender Ext: warm, well perfused  NEURO:  Mental Status: Patient becomes alert with verbal stimuli, but requires some tactile stimulation intermittently to keep awake. Speech/Language: Intubated and sedated.  Cranial Nerves:  II: PERR 2 mm/brisk. Unable to assess visual fields III, IV, VI: Does not follow or track across midline to the left. Gazes to the right. V: Does not blink to threat of the left side VII:  left facial droop. VIII: Hearing intact to voice IX, X: unable to assess due to intubated and sedated XI: No use of the left shoulder or arm. Falls onto his left side. XII: Head turned to the right.  Motor: At least 3/5 in RLE and good grip in the R hand. 2/5 in LUE/ LLE to noxious stimuli Coordination:Unable to assess due to  AMS Gait- deferred  ASSESSMENT/PLAN Mr. BEAUFORD LANDO is a 87 y.o. male with history of A. fib, CVA, seizure disorder, history of DVT, hyperlipidemia, chronic kidney disease stage III, severe AS status post TAVR in January 2022, OSA not on CPAP, nonobstructive CAD presenting with confusion. Was found to have multiple small areas of acute infarct in the right cerebral hemisphere and right ICA and right MCA occlusions.  He underwent emergent mechanical thrombectomy of right ICA terminus and right MCA occlusions achieving a TICI 3 revascularization     Stroke:  right MCA and left CR, left MCA/PCA punctate scattered infarcts with right ICA and proximal MCA occlusions s/p IR with TICI 3 revasculariztion. These events occurred in the setting of known atrial fibrillation off anticoagulation due to recent burr hole surgery for SDH.     Code Stroke CT head no acute intracranial hemorrhage  CTA head right ICA remains occluded, High-grade stenosis of M2 right MCA  branch vessel.   CTA neck cervical right ICA becomes occluded or near occluded at its proximal to mid aspect. The vessel remains occluded or near occluded throughout the remainder of the neck. Left CCA and ICA patent within the neck with less than 50% stenosis.   CT perfusion no core infarct but large penumbra.  CT head repeat no bleeding  MRI  Multiple small areas of acute infarct in the right cerebral hemisphere. As well as left CR and left MCA/PCA punctate infarcts  MRA Degraded by motion, Right ICA is now patent. Suspect moderate narrowing at the Clinoid  2D Echo pending  LDL 60  HgbA1c 5.4  NPO per bedside RN swallow test. Given dsypha  VTE prophylaxis - heparin IV infusion  Diet: heart healthy  aspirin 81 mg daily prior to admission, now on heparin IV.   Therapy recommendations:  pending  Disposition:  Pending  Transfer to floor today   Seizure history  11/2020 due to SDH, discharged with depakote 500 bid  This  admission Depakote level 44->41->65  Loaded with Depakote 1500mg  x1  Continue Depakote 500mg  bid  EEG: mild-moderate diffuse encephalopathy, with possible/not definitive increased areas of focal neuronal dysfunction in the right (artifact is more favored) and possibly in the anterior regions (bilaterally).  There are no clear epileptiform abnormalities.  Right ICA and MCA occlusions  CTA Head/Neck showed R ICA occlusion proximally with moderate/severe stenosis at the origin of the dominant L vertebral artery.  S/p IR thrombectomywithTICI3 revascularization of occluded ICA to the terminus and RT MCA x 1 pass  SBP goal 120-140 at 24h post procedure  Bilateral lower extremity DVTs  History of blood clots, prior DVT 2006  This admission LE venous doppler - R posterior tibial and peroneal veins and L peroneal veins DVT.  Heparin infusion per stroke protocol - No bolus per stroke protocol. Goal level Heparin level 0.3-0.5 units/ml  Per NSG, repeat head CT once heparin therapeutic level to evaluate for SDH  Atrial fibrillation  Cardiology consult is pending  Previously on coumadin -> eliquis -> Xarelto, held due to SDH s/p craniotomy 11/2020  Heparin IV infusion  Heparin level 0.13->0.31  Monitor for HIT  Hgb 10.7->9.9->9.7  Echo pending  Rate controlled  SDH  11/2020 left SDH status post evacuation  Anticoagulation was on hold, plan to resume in 6 weeks  This admission CT head left SDH much improved  Discussed with NSG Dr. Venetia Constable, okay to start heparin IV with close monitoring  History of stroke  03/2019 admitted for left-sided weakness, left hemianopia, MRI showed only incidental small left cerebellar infarct.  Diagnosed with right brain TIA.  CT showed no acute finding, but old left MCA infarct.  MCA showed right siphon mild to moderate stenosis.  Carotid Doppler negative.  EF 60 to 65%.  LDL 94, A1c 5.3.  Continued on Coumadin and Lipitor  80  Dysphagia  Passed swallow  SLP eval 3/23: Dysphagia 1 puree thin liquid diet  monitor po intake, SLIV if tolerating po well  Urosepsis  Febrile on admission 101.9 -> afebrile  WBC 11.7 -> 9.2->7  UA: +nitrite, +LE, WBC >50  Urine culture: ecoli  Recent ecoli uti (12/06/20)  Received vancomycin IV x1, continue cefepime   Continue foley catheter, consider dc in am   Hypertension  Home meds:  Amlodipine 5mg  daily  BP soft  Off Cleveprex  SBP 120-140 at 24h post procedure   Hyperlipidemia  Home meds:  Atorvastatin 80mg  daily, resumed in hospital  LDL 60, goal < 70  Continue statin at discharge  CKD Stage III  Baseline sCr 1.3-1.5.  Cre now 1.18->1.16->1.18  Other Stroke Risk Factors  Advanced Age >/= 36   Overweight, Body mass index is 27.98 kg/m., recommend weight loss, diet and exercise as appropriate   Hx stroke  Family hx stroke (father stroke)  Coronary artery disease:   Abdominal aortic aneurysm  TAVR 10/2020    Other Active Problems  GERD: protonix  BPH: proscar  Hospital day # 2  This plan of care was directed by Dr. Erlinda Hong.  Hetty Blend, NP-C   ATTENDING NOTE: I reviewed above note and agree with the assessment and plan. Pt was seen and examined.   Son and wife at bedside.  Patient reclining in bed, awake alert, hard of hearing, however orientated to place people and time.  Mild dysarthria, however no aphasia, follows simple commands.  Mild left facial droop, left upper extremity mild drift, however bilateral lower extremity symmetrical strengths.  No acute event overnight.  On heparin IV, tolerating well.  Still pending 2D echo.  Depakote level 65, improved from yesterday, continue Depakote.  Wife told me that last admission patient had thrombocytopenia due to heparin, however, at this time patient platelet stable with heparin IV.  We will continue monitoring platelet level.  Patient this morning had some choking on  food and water, will have speech therapist to take a look.  Wife also stated patient was on Eliquis before but not able to afford, change to Xarelto still very expensive to afford.  I will ask our social worker to discuss with her for potential options of financial support.  For detailed assessment and plan, please refer to above as I have made changes wherever appropriate.   Rosalin Hawking, MD PhD Stroke Neurology 12/03/2020 5:48 PM  This patient is critically ill due to right ICA occlusion status post thrombectomy, right MCA stroke, A. fib on heparin IV, DVT, seizure disorder, SDH and at significant risk of neurological worsening, death form hemorrhagic conversion, recurrent stroke, status epilepticus, PE, heart failure. This patient's care requires constant monitoring of vital signs, hemodynamics, respiratory and cardiac monitoring, review of multiple databases, neurological assessment, discussion with family, other specialists and medical decision making of high complexity. I spent 35 minutes of neurocritical care time in the care of this patient. I had long discussion with wife and son at bedside, updated pt current condition, treatment plan and potential prognosis, and answered all the questions.  They expressed understanding and appreciation.        To contact Stroke Continuity provider, please refer to [email or web address] After hours, contact General Neurology

## 2020-12-25 NOTE — Evaluation (Signed)
Physical Therapy Evaluation Patient Details Name: Cesar [name] MRN: [id number] DOB: 07-16-1929 Today's Date: 12/29/2020   History of Present Illness  Pt is an 87 y.o. male who presented 3/21 with a fever and weakness. Pt found to have severe sepsis secondary to UTI, acute metabolic encephalopathy, and acute bil lower extremity DVTs. 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 S/p complete revascularization of occluded ICA to the terminus and RT MCA 3/21. ETT 3/21-3/22. PMH: A. fib, CVA, seizure disorder, history of DVT, hyperlipidemia, CKD stage III, severe aortic stenosis status post TAVR in January 2022, BPH, OSA not on CPAP, ascending thoracic aortic aneurysm, and L subdural hemorrhage 11/2020 s/p burr hole drainage.   Clinical Impression   Pt admitted with above and deficits mentioned below, see PT Problem List. He demonstrates Lt sided incoordination, including ataxia and possible dysdiadochokinesia and dysmetria. His R side also displays possible dysmetria (family reports R side was affected with his recent prior stroke). In addition, pt demonstrates impaired cognition, impaired balance, decreased activity tolerance, and decreased strength (L weaker than R).  He currently requires mod A +2 for functional mobility, ambulating only ~10 ft with a RW. He has particular difficulty turning to the L and tends to drag his L foot and drop his L hand off the RW, indicating possible L side neglect. Pt is at high risk for falls. He lives with his wife at home, and recently returned home from [place].  Per wife, he was ambulating and performing ADLs with min guard assist.  Recommend CIR. Will continue to follow acutely.    Follow Up Recommendations CIR;Supervision/Assistance - 24 hour    Equipment Recommendations  None recommended by PT (defer to post acute)    Recommendations for Other Services Rehab consult      Precautions / Restrictions Precautions Precautions: Fall Precaution Comments: HOH, ataxic, L neglect Restrictions Weight Bearing Restrictions: No      Mobility  Bed Mobility Overal bed mobility: Needs Assistance Bed Mobility: Supine to Sit     Supine to sit: Min assist     General bed mobility comments: pt able to move LEs off the EOB and requires min A to lift trunk and scoot hips to EOB    Transfers Overall transfer level: Needs assistance Equipment used: Rolling walker (2 wheeled) Transfers: Sit to/from [facility] Sit to Stand: Mod assist;+2 physical assistance;+2 safety/equipment Stand pivot transfers: Mod assist;+2 physical assistance;+2 safety/equipment       General transfer comment: pt requires assist to boost into standing and assist with manuevering RW - demonstrates difficulty making Lt hand turns. Needs assistance for L hand placement on RW repeatedly. Poor hip extension in standing.  Ambulation/Gait Ambulation/Gait assistance: Mod assist;+2 physical assistance;+2 safety/equipment Gait Distance (Feet): 10 Feet Assistive device: Rolling walker (2 wheeled) Gait Pattern/deviations: Step-to pattern;Decreased step length - left;Decreased stride length;Drifts right/left;Trunk flexed (drifts R) Gait velocity: decreased Gait velocity interpretation: <1.31 ft/sec, indicative of household ambulator General Gait Details: Pt ambulates with decreased hip extension and with trunk flexed in slow, unsteady manner. Pt tends to dirft to the R and needs max cues to redirect to the L, especially turning to the L. Pt drags L foot and drops L hand off RW at times, possible L neglect. ModAx2 to steady and direct pt and manage RW.  Stairs            Emergency planning/management officer  Modified Rankin (Stroke Patients Only) Modified Rankin (Stroke Patients Only) Pre-Morbid Rankin Score: Moderate disability Modified Rankin: Moderately severe disability      Balance Overall balance assessment: Needs assistance Sitting-balance support: Single extremity supported;Feet supported Sitting balance-Leahy Scale: Poor Sitting balance - Comments: Pt requires UE support to maintain static sitting with min guard assist, however, as he fatigued, noted pt required bil. UE support and up to mod A.   Standing balance support: Bilateral upper extremity supported Standing balance-Leahy Scale: Poor Standing balance comment: requires max A                             Pertinent Vitals/Pain Pain Assessment: No/denies pain    Home Living Family/patient expects to be discharged to:: Private residence Living Arrangements: Spouse/significant other Available Help at Discharge: Family;Available 24 hours/day (son is retired and lives close by) Type of Home: House Home Access: Stairs to enter Entrance Stairs-Rails: Right Entrance Stairs-Number of Steps: 2 [place]: One level Home Equipment: Environmental consultant - 2 wheels;Bedside commode;Cane - single point      Prior Function Level of Independence: Needs assistance   Gait / Transfers Assistance Needed: pt was ambulating at home with min guard assist - supervision and use of RW  ADL's / Homemaking Assistance Needed: Per wife, pt was performing ADLs with supervision - min guard assist  Comments: Per last admission: pt reports that he has 12 acres of land and likes riding his machinery on the land. 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  He demonstrates mild ataxia, Lt inattention.  Diffiuculty oposing digits LUE Coordination: decreased gross motor;decreased fine motor    Lower Extremity  Assessment Lower Extremity Assessment: RLE deficits/detail;LLE deficits/detail RLE Deficits / Details: MMT grossly 4+ RLE Sensation:  (denies numbness/tingling) RLE Coordination: decreased gross motor;decreased fine motor (possible dysmetria noted) LLE Deficits / Details: MMT grossly 4- LLE Sensation:  (denies numbness/tingling) LLE Coordination: decreased fine motor;decreased gross motor (dysdiadochokinesia and possible dysmetria noted)    Cervical / Trunk Assessment Cervical / Trunk Assessment: Kyphotic  Communication   Communication:  (has hearing aids)  Cognition Arousal/Alertness: Awake/alert Behavior During Therapy: Impulsive Overall Cognitive Status: Difficult to assess Area of Impairment: Attention;Memory;Following commands;Safety/judgement;Problem solving;Awareness                   Current Attention Level: Sustained Memory: Decreased short-term memory Following Commands: Follows one step commands consistently;Follows one step commands with increased time (and multi modal cues) Safety/Judgement: Decreased awareness of safety;Decreased awareness of deficits Awareness: Intellectual Problem Solving: Slow processing;Difficulty sequencing;Requires verbal cues;Requires tactile cues General Comments: Pt requires mod cues for safety.  He is slow to initiate activity/movement, and requires mod cues for safety and problem solving.  He can follow simple one step commands consistently with multi modal cues, but struggles with 2 step commands.  Cognition difficult to accurately assess due to severity of Hearing loss      General Comments General comments (skin integrity, edema, etc.): VSS    Exercises     Assessment/Plan    PT Assessment Patient needs continued PT services  PT Problem List Decreased strength;Decreased activity tolerance;Decreased balance;Decreased mobility;Decreased knowledge  of use of DME;Decreased safety awareness;Decreased knowledge of  precautions;Decreased range of motion;Decreased cognition;Decreased coordination       PT Treatment Interventions DME instruction;Gait training;Stair training;Functional mobility training;Therapeutic activities;Therapeutic exercise;Balance training;Neuromuscular re-education;Cognitive remediation;Patient/family education    PT Goals (Current goals can be found in the Care Plan section)  Acute Rehab PT Goals Patient Stated Goal: did not state PT Goal Formulation: With patient Time For Goal Achievement: 01/08/21 Potential to Achieve Goals: Good    Frequency Min 4X/week   Barriers to discharge        Co-evaluation PT/OT/SLP Co-Evaluation/Treatment: Yes Reason for Co-Treatment: Necessary to address cognition/behavior during functional activity;For patient/therapist safety;To address functional/ADL transfers PT goals addressed during session: Mobility/safety with mobility;Balance;Proper use of DME OT goals addressed during session: ADL's and self-care       AM-PAC PT "6 Clicks" Mobility  Outcome Measure Help needed turning from your back to your side while in a flat bed without using bedrails?: A Little Help needed moving from lying on your back to sitting on the side of a flat bed without using bedrails?: A Lot Help needed moving to and from a bed to a chair (including a wheelchair)?: A Lot Help needed standing up from a chair using your arms (e.g., wheelchair or bedside chair)?: A Lot Help needed to walk in hospital room?: A Lot Help needed climbing 3-5 steps with a railing? : Total 6 Click Score: 12    End of Session Equipment Utilized During Treatment: Gait belt Activity Tolerance: Patient tolerated treatment well Patient left: in chair;with call bell/phone within reach;with chair alarm set Nurse Communication: Mobility status PT Visit Diagnosis: Unsteadiness on feet (R26.81);Other abnormalities of gait and mobility (R26.89);Muscle weakness (generalized) (M62.81);Other  symptoms and signs involving the nervous system (R29.898);Difficulty in walking, not elsewhere classified (R26.2)    Time: 0263-7858 PT Time Calculation (min) (ACUTE ONLY): 33 min   Charges:   PT Evaluation $PT Eval Moderate Complexity: 1 Mod          [name], PT, DPT Acute Rehabilitation Services  Pager: [phone] Office: [phone]   Orvan Falconer 12/22/2020, 4:26 PM

## 2020-12-25 NOTE — Progress Notes (Signed)
S/P IR Hold heparin. [facility]TH in AM D/w Dr. Estanislado Pandy and Erlinda Hong.   -- Amie Portland, MD Neurologist [facility] Pager: [phone]

## 2020-12-25 NOTE — Progress Notes (Incomplete)
Pt placed in chair by therapy at 1430. At 1600, pt unresponsive. MD notified, stat head CT ordered.

## 2020-12-25 NOTE — Progress Notes (Signed)
Called by the RN that pt last seen well at 3pm sitting in chair after working with PT/OT. Then on the next check, he slumped over in the chair and not responsive. Heparin was on hold and stat CT head ordered. I met pt in the CT suite. Pt not responsive, with right gaze and left hemiplegia. He still moved his right arm and leg on pain stimulation. CT head no acute change. The pt at that time was able to open eyes on voice stimulation but still not verbal or following commands, still has left hemiplegia. Right gaze seems improving with rolling eyes. CTA head and neck showed right M1 occlusion and short segment of right P2 high grade stenosis vs. Occlusion. CTP showed large penumbra.   IR suite has a pt on the table. I called pt wife and daughter and updated them about current development. They were in agreement to sent pt to [place] [facility] for thrombectomy. I called [facility] transfer line and received called back from Dr. Owens Shark interventional neuroradiologist and he kind enough accepted pt.  However, there is a [facility] touch down in the surrounding area and [facility] can not transfer pt until weather situation getting better. Dr. Owens Shark stated that if pt can get IR procedure in [facility] within 1 hour, that will be reasonable to stay, otherwise transfer to [facility].   Our stroke RN Jinny Blossom discussed with our IR and estimated procedure time will be within 30 min. I then updated family and updated [facility] transfer line. I also discussed with Dr. Estanislado Pandy and will start procedure soon.   NIH Stroke Scale  Level Of Consciousness 0=Alert; keenly responsive 1=Arouse to minor stimulation 2=Requires repeated stimulation to arouse or movements to pain 3=postures or unresponsive 2  LOC Questions to Month and Age 21=Answers both questions correctly 1=Answers one question correctly or dysarthria/intubated/trauma/language barrier 2=Answers neither question correctly or aphasia 2  LOC Commands      -Open/Close  eyes     -Open/close grip     -Pantomime commands if communication barrier 0=Performs both tasks correctly 1=Performs one task correctly 2=Performs neighter task correctly 2  Best Gaze     -Only assess horizontal gaze 0=Normal 1=Partial gaze palsy 2=Forced deviation, or total gaze paresis 1  Visual 0=No visual loss 1=Partial hemianopia 2=Complete hemianopia 3=Bilateral hemianopia (blind including cortical blindness) 3  Facial Palsy     -Use grimace if obtunded 0=Normal symmetrical movement 1=Minor paralysis (asymmetry) 2=Partial paralysis (lower face) 3=Complete paralysis (upper and lower face) 2  Motor  0=No drift for 10/5 seconds 1=Drift, but does not hit bed 2=Some antigravity effort, hits  bed 3=No effort against gravity, limb falls 4=No movement 0=Amputation/joint fusion Right Arm 2     Leg 2    Left Arm 4     Leg 3  Limb Ataxia     - FNT/HTS 0=Absent or does not understand or paralyzed or amputation/joint fusion 1=Present in one limb 2=Present in two limbs 0  Sensory 0=Normal 1=Mild to moderate sensory loss 2=Severe to total sensory loss or coma/unresponsive 1  Best Language 0=No aphasia, normal 1=Mild to moderate aphasia 2=Severe aphasia 3=Mute, global aphasia, or coma/unresponsive 3  Dysarthria 0=Normal 1=Mild to moderate 2=Severe, unintelligible or mute/anarthric 0=intubated/unable to test 2  Extinction/Neglect 0=No abnormality 1=visual/tactile/auditory/spatia/personal inattention/Extinction to bilateral simultaneous stimulation 2=Profound neglect/extinction more than 1 modality  2  Total   31   Cesar Hawking, MD PhD Stroke Neurology 12/18/2020 5:43 PM  This patient is critically ill due to new AMS and  left hemiplegia, right MCA and PCA occlusion and at significant risk of neurological worsening, death form recurrent stroke, hemorrhagic conversion, seizure, PE, heart failure. This patient's care requires constant monitoring of vital signs, hemodynamics,  respiratory and cardiac monitoring, review of multiple databases, neurological assessment, discussion with family, other specialists and medical decision making of high complexity. I spent 30 minutes of neurocritical care time in the care of this patient. I had long discussion with daughter and wife on the phone, updated pt current condition, treatment plan and potential prognosis, and answered all the questions. They expressed understanding.

## 2020-12-25 NOTE — Evaluation (Signed)
Occupational Therapy Evaluation Patient Details Name: Cesar [name] MRN: [id number] DOB: 02-Mar-1929 Today's Date: 12/22/2020    History of Present Illness Pt is an 87 y.o. male who presented 3/21 with a fever and weakness. Pt found to have severe sepsis secondary to UTI, acute metabolic encephalopathy, and acute bil lower extremity DVTs. 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 S/p complete revascularization of occluded ICA to the terminus and RT MCA 3/21. ETT 3/21-3/22. PMH: A. fib, CVA, seizure disorder, history of DVT, hyperlipidemia, CKD stage III, severe aortic stenosis status post TAVR in January 2022, BPH, OSA not on CPAP, ascending thoracic aortic aneurysm, and L subdural hemorrhage 11/2020 s/p burr hole drainage.   Clinical Impression   Pt admitted with above. He demonstrates the below listed deficits and will benefit from continued OT to maximize safety and independence with BADLs.  Pt presents to OT with Lt sided ataxia, mild Rt UE dysmetria, impaired balance, decreased activity tolerance, impaired balance, impaired cognition.  He currently requires mod - max A for ADLs and mod A +2 for functional mobility.  He lives with his wife at home, and recently returned home from [place].  Per wife, he was ambulating and performing ADLs with min guard assist.  Recommend CIR.       Follow Up Recommendations  CIR;Supervision/Assistance - 24 hour    Equipment Recommendations  None recommended by OT    Recommendations for Other Services Rehab consult     Precautions / Restrictions Precautions Precautions: Fall Precaution Comments: HOH, ataxic Restrictions Weight Bearing Restrictions: No      Mobility Bed Mobility Overal bed mobility: Needs Assistance Bed Mobility: Supine to Sit     Supine to sit: Min assist     General bed mobility comments: pt able to move LEs off the EOB and  requires min A to lift trunk and scoot hips to EOB    Transfers Overall transfer level: Needs assistance Equipment used: Rolling walker (2 wheeled) Transfers: Sit to/from [facility] Sit to Stand: Mod assist;+2 physical assistance;+2 safety/equipment Stand pivot transfers: Mod assist;+2 physical assistance;+2 safety/equipment       General transfer comment: pt requires assist to boost into standing and assist with manuevering RW - demonstrates difficulty making Lt hand turns    Balance Overall balance assessment: Needs assistance Sitting-balance support: Single extremity supported;Feet supported Sitting balance-Leahy Scale: Poor Sitting balance - Comments: Pt requires UE support to maintain static sitting with min guard assist, however, as he fatigued, noted pt required bil. UE support and up to mod A.   Standing balance support: Bilateral upper extremity supported Standing balance-Leahy Scale: Poor Standing balance comment: requires max A                           ADL either performed or assessed with clinical judgement   ADL Overall ADL's : Needs assistance/impaired Eating/Feeding: Moderate assistance;Sitting   Grooming: Wash/dry hands;Wash/dry face;Oral care;Brushing hair;Moderate assistance;Sitting   Upper Body Bathing: Moderate assistance;Sitting   Lower Body Bathing: Maximal assistance;Sit to/from stand   Upper Body Dressing : Moderate assistance;Sitting   Lower Body Dressing: Maximal assistance;Sit to/from stand Lower Body Dressing Details (indicate cue type and reason): requires assist to pull socks over toes and assist to pull them over feet Toilet Transfer: Moderate assistance;+2 for physical assistance;With caregiver independent assisting;RW;BSC Toilet Transfer Details (indicate cue type and  reason): pt requires cues to fully extend hips.  He requires cues and assist to maneuver RW.  He requires assist for safety Toileting- Clothing  Manipulation and Hygiene: Maximal assistance;Sit to/from stand       Functional mobility during ADLs: Moderate assistance;+2 for safety/equipment;+2 for physical assistance;Rolling walker       Vision Baseline Vision/History: Wears glasses Wears Glasses: Reading only Patient Visual Report: No change from baseline Vision Assessment?: Yes Eye Alignment: Within Functional Limits Ocular Range of Motion: Within Functional Limits Alignment/Gaze Preference: Within Defined Limits Additional Comments: Pt able to locate items in all quadrants.  He however, tends to close Lt eye or squint when looking for items on his Lt - he was unable to report if he is experiencing diplopia     Perception Perception Perception Tested?: Yes Perception Deficits: Inattention/neglect Inattention/Neglect: Does not attend to left side of body Comments: Lt UE frequently fell off of RW with no awareness and requires max A to male Lt hand turns   Praxis Praxis Praxis tested?: Within functional limits    Pertinent Vitals/Pain Pain Assessment: No/denies pain Faces Pain Scale: Hurts a little bit Pain Intervention(s): Monitored during session     Hand Dominance Left   Extremity/Trunk Assessment Upper Extremity Assessment Upper Extremity Assessment: RUE deficits/detail;LUE deficits/detail RUE Deficits / Details: mild dysmetria noted LUE Deficits / Details: Pt demonstrates movement in Brunnstrom stage 5.  He demonstrates mild ataxia, Lt inattention.  Diffiuculty oposing digits LUE Coordination: decreased gross motor;decreased fine motor   Lower Extremity Assessment Lower Extremity Assessment: Defer to PT evaluation   Cervical / Trunk Assessment Cervical / Trunk Assessment: Kyphotic   Communication Communication Communication:  (has hearing aids)   Cognition Arousal/Alertness: Awake/alert Behavior During Therapy: Impulsive Overall Cognitive Status: Difficult to assess Area of Impairment:  Attention;Memory;Following commands;Safety/judgement;Problem solving;Awareness                   Current Attention Level: Sustained Memory: Decreased short-term memory Following Commands: Follows one step commands consistently;Follows one step commands with increased time (and multi modal cues) Safety/Judgement: Decreased awareness of safety;Decreased awareness of deficits Awareness: Intellectual Problem Solving: Slow processing;Difficulty sequencing;Requires verbal cues;Requires tactile cues General Comments: Pt requires mod cues for safety.  He is slow to initiate activity/movement, and requires mod cues for safety and problem solving.  He can follow simple one step commnads consistently with multi modal cues, but struggles with 2 step commands.  Cognition difficult to accurately assess due to severity of Hearing loss   General Comments  VSS    Exercises     Shoulder Instructions      Home Living Family/patient expects to be discharged to:: Private residence Living Arrangements: Spouse/significant other Available Help at Discharge: Family;Available 24 hours/day (son is retired and lives close by) Type of Home: House Home Access: Stairs to enter Technical brewer of Steps: 2 Entrance Stairs-Rails: Right [place]: One level     Bathroom Shower/Tub: Teacher, early years/pre: Standard Bathroom Accessibility: Yes How Accessible: Accessible via walker Home Equipment: [place] - 2 wheels;Bedside commode;Cane - single point      Lives With: Spouse    Prior Functioning/Environment Level of Independence: Needs assistance  Gait / Transfers Assistance Needed: pt was ambulating at home with min guard assist - supervision and use of RW ADL's / Homemaking Assistance Needed: Per wife, pt was performing ADLs with supervision - min guard assist Communication / Swallowing Assistance Needed: HOH Comments: Per last admission: pt reports that he  has 12 acres of land and  likes riding his machinery on the land. 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[place] with RW        OT Problem List:        OT Treatment/Interventions: Self-care/ADL training;Neuromuscular education;DME and/or AE instruction;Therapeutic activities;Cognitive remediation/compensation;Balance training    OT Goals(Current goals can be found in the care plan section) Acute Rehab OT Goals Patient Stated Goal: did not state OT Goal Formulation: With patient Time For Goal Achievement: 01/09/21 Potential to Achieve Goals: Good ADL Goals Pt Will Perform Eating: (P) with supervision;with adaptive utensils;sitting Pt Will Perform Grooming: (P) with min assist;standing Pt Will Perform Upper Body Bathing: (P) with supervision;sitting Pt Will Perform Lower Body Bathing: (P) with mod assist;sit to/from stand;with adaptive equipment Pt Will Transfer to Toilet: (P) with mod assist;ambulating;regular height toilet;bedside commode;grab bars Pt Will Perform Toileting - Clothing Manipulation and hygiene: (P) with mod assist;sit to/from stand  OT Frequency: Min 2X/week   Barriers to D/C:            Co-evaluation PT/OT/SLP Co-Evaluation/Treatment: Yes Reason for Co-Treatment: For patient/therapist safety;To address functional/ADL transfers;Necessary to address cognition/behavior during functional activity   OT goals addressed during session: ADL's and self-care      AM-PAC OT "6 Clicks" Daily Activity     Outcome Measure Help from another person eating meals?: A Lot Help from another person taking care of personal grooming?: A Lot Help from another person toileting, which includes using toliet, bedpan, or urinal?: A Lot Help from another person bathing (including washing, rinsing, drying)?: A Lot Help from another person to put on and taking off regular upper body clothing?: A Lot Help from another person to put on and taking off regular lower body clothing?: A Lot 6 Click  Score: 12   End of Session Equipment Utilized During Treatment: Surveyor, mining Communication: Mobility status  Activity Tolerance: Patient tolerated treatment well Patient left: in chair;with call bell/phone within reach;with chair alarm set  OT Visit Diagnosis: Unsteadiness on feet (R26.81);Muscle weakness (generalized) (M62.81);Ataxia, unspecified (R27.0);Cognitive communication deficit (R41.841);Hemiplegia and hemiparesis                Time: 0354-6568 OT Time Calculation (min): 55 min Charges:  OT General Charges $OT Visit: 1 Visit OT Evaluation $OT Eval Moderate Complexity: 1 Mod OT Treatments $Self Care/Home Management : 8-22 mins  Nilsa Nutting OTR/L Acute Rehabilitation Services Pager [phone] Office [place], [place] 12/12/2020, 3:43 PM

## 2020-12-25 NOTE — Progress Notes (Signed)
2000- Patient's a-line is very positional and has a whip and the waveform is flattened. Blood pressure will be monitored using the cuff pressure. A-line will be removed in the am after morning labs are drawn.

## 2020-12-25 NOTE — Anesthesia Procedure Notes (Addendum)
Procedure Name: Intubation Date/Time: 12/14/2020 5:43 PM Performed by: Josephine Igo, CRNA Pre-anesthesia Checklist: Emergency Drugs available, Suction available, Patient being monitored and Timeout performed Patient Re-evaluated:Patient Re-evaluated prior to induction Oxygen Delivery Method: Circle system utilized Preoxygenation: Pre-oxygenation with 100% oxygen Induction Type: IV induction and Rapid sequence Ventilation: Mask ventilation without difficulty Laryngoscope Size: Miller and 2 Grade View: Grade I Tube type: Oral Tube size: 7.5 mm Number of attempts: 1 Airway Equipment and Method: Stylet Placement Confirmation: ETT inserted through vocal cords under direct vision,  positive ETCO2 and breath sounds checked- equal and bilateral Secured at: 23 cm Tube secured with: Tape Dental Injury: Teeth and Oropharynx as per pre-operative assessment

## 2020-12-25 NOTE — Procedures (Signed)
Echo attempted. Patient in CT. Will attempt again later as time allows.

## 2020-12-25 NOTE — Progress Notes (Signed)
PCCM Brief Progress Note  87 yo M admitted 3/21 found to have R ICA occlusion s/p thrombectomy, remained intubated after NIR. Extubated 3/22. Intended for transfer to Med tele 3/23 however pt found to be unresponsive, slumped over. Heparin held and pt went for CT H -- no acute change.    CTA head neck with R M1 occlusion and R P2 high grade stenosis vs occlusion, CTP larg penumbra.   Emergently taken to NIR at [facility] pt likely to return to ICU after    PCCM will continue to follow    Eliseo Gum MSN, AGACNP-BC [place] [phone] If no answer, [phone] 12/17/2020, 5:55 PM

## 2020-12-25 NOTE — Progress Notes (Signed)
PCCM interval progress note:  Pt returned from IR intubated, hemodynamically stable on propofol.  SBP goal overnight 120-140, add prn Fentanyl and ABG.   Otilio Carpen Adeleigh Barletta, PA-C

## 2020-12-25 NOTE — Anesthesia Preprocedure Evaluation (Addendum)
Anesthesia Evaluation  Patient identified by MRN, date of birth, ID band Patient confused and Patient unresponsive    Reviewed: Allergy & Precautions, NPO status , Patient's Chart, lab work & pertinent test resultsPreop documentation limited or incomplete due to emergent nature of procedure.  Airway Mallampati: III  TM Distance: >3 FB     Dental  (+) Dental Advisory Given   Pulmonary sleep apnea , former smoker,    breath sounds clear to auscultation       Cardiovascular + DVT  + dysrhythmias Atrial Fibrillation  Rhythm:Regular Rate:Tachycardia  ECG: a-fib, rate 114   Neuro/Psych Seizures -,  PSYCHIATRIC DISORDERS Depression TIACVA, Residual Symptoms    GI/Hepatic negative GI ROS, Neg liver ROS,   Endo/Other  negative endocrine ROS  Renal/GU negative Renal ROS     Musculoskeletal negative musculoskeletal ROS (+)   Abdominal   Peds  Hematology  (+) anemia , HLD   Anesthesia Other Findings code stroke  Reproductive/Obstetrics                           Anesthesia Physical Anesthesia Plan  ASA: IV and emergent  Anesthesia Plan: General   Post-op Pain Management:    Induction: Intravenous  PONV Risk Score and Plan: 2 and Ondansetron, Dexamethasone and Treatment may vary due to age or medical condition  Airway Management Planned: Oral ETT  Additional Equipment: Arterial line  Intra-op Plan:   Post-operative Plan: Possible Post-op intubation/ventilation  Informed Consent: I have reviewed the patients History and Physical, chart, labs and discussed the procedure including the risks, benefits and alternatives for the proposed anesthesia with the patient or authorized representative who has indicated his/her understanding and acceptance.       Plan Discussed with:   Anesthesia Plan Comments:        Anesthesia Quick Evaluation

## 2020-12-25 NOTE — TOC Initial Note (Addendum)
Transition of Care [facility]) - Initial/Assessment Note    Patient Details  Name: Cesar [name] MRN: [id number] Date of Birth: 23-Mar-1929  Transition of [facility]) CM/SW Contact:    Cesar Bodo, RN Phone Number: 12/29/2020, 2:10 PM  Clinical Narrative:  20 yom s/p bur hole drainage of complex L chronic SDH last month presenting with acute CVA s/p IR for cerebral arteriogram, emergent mechanical thrombectomy on 3/21. Patient also found to have acute bilateral DVT. PTA apixababn has been held since 11/21/20 with SDH/crani with plan to hold x 6 weeks.   PTA, pt living at home and cared for by spouse and family.  Wife reports that they have been paying $600 for 90 day supply of Xarelto.  Pt has Medicare and commercial plan [employer].  Called this commercial agency; they state that plan is a part "G" plan, only covering the 20% that Medicare does not pay.  Patient has no Rx coverage with this plan.  I informed wife/son about this, and they were under the impression that plan covers meds.  Called JJ PAF Wynetta Emery and [place]).  They stated that pt may qualify for medication assistance with Xarelto, as he has no Rx insurance.  They recommended to submit patient assist application for review.  Forms have been completed and are on chart for MD completion.  Will review with patient's wife and son when they return later today.   12/13/2020 1530 Notified by patient's wife, Cesar [name], that pt does, in fact, have a part D plan with [employer]. I have confirmed with pt's pharmacy that they do have this information.  Per MD, he prefers Eliquis to Xarelto.  [employer] patient assistance form placed on chart for MD completion.  TOC will follow up with family to submit form to drug company upon completion.     Expected Discharge Plan: IP Rehab Facility Barriers to Discharge: Continued Medical Work up   Patient Goals and CMS Choice        Expected Discharge Plan and  Services Expected Discharge Plan: [place]   Discharge Planning Services: CM Consult,Medication Assistance   Living arrangements for the past 2 months: Single Family Home                                      Prior Living Arrangements/Services Living arrangements for the past 2 months: Single Family Home Lives with:: Spouse Patient language and need for interpreter reviewed:: Yes Do you feel safe going back to the place where you live?: Yes      Need for Family Participation in Patient Care: Yes (Comment) Care giver support system in place?: Yes (comment) Current home services: DME,Home PT,Home OT ([place]) Criminal Activity/Legal Involvement Pertinent to Current Situation/Hospitalization: No - Comment as needed  Activities of Daily Living Home Assistive Devices/Equipment: Hearing aid ADL Screening (condition at time of admission) Patient's cognitive ability adequate to safely complete daily activities?: No Is the patient deaf or have difficulty hearing?: Yes Does the patient have difficulty seeing, even when wearing glasses/contacts?: No Does the patient have difficulty concentrating, remembering, or making decisions?: Yes Patient able to express need for assistance with ADLs?: No Does the patient have difficulty dressing or bathing?: Yes Independently performs ADLs?: No Communication: Dependent Is this a change from baseline?: Change from baseline, expected to last <3 days Dressing (OT): Dependent Is this a change from  baseline?: Change from baseline, expected to last >3 days Grooming: Dependent Is this a change from baseline?: Change from baseline, expected to last >3 days Feeding: Dependent Is this a change from baseline?: Change from baseline, expected to last >3 days Bathing: Dependent Is this a change from baseline?: Change from baseline, expected to last >3 days Toileting: Dependent Is this a change from baseline?: Change from baseline,  expected to last >3days In/Out Bed: Dependent Is this a change from baseline?: Change from baseline, expected to last >3 days Walks in Home: Dependent Is this a change from baseline?: Change from baseline, expected to last >3 days Does the patient have difficulty walking or climbing stairs?: Yes Weakness of Legs: Left Weakness of Arms/Hands: Left  Permission Sought/Granted                  Emotional Assessment Appearance:: Appears stated age Attitude/Demeanor/Rapport: Engaged Affect (typically observed): Accepting Orientation: : Oriented to Self,Oriented to Place,Oriented to Situation      Admission diagnosis:  UTI (urinary tract infection) [N39.0] Stroke ([place]) [I63.9] Acute cystitis with hematuria [N30.01] Sepsis, due to unspecified organism, unspecified whether acute organ dysfunction present [facility]) [A41.9] Middle cerebral artery embolism, right [I66.01] Patient Active Problem List   Diagnosis Date Noted  . UTI (urinary tract infection) 01/01/2021  . Severe sepsis ([place]) 12/17/2020  . Acute metabolic encephalopathy 93/57/0177  . Stroke ([place]) 12/16/2020  . Middle cerebral artery embolism, right 12/18/2020  . Dysphagia   . Hematuria   . Acute lower UTI   . Traumatic subdural hematoma ([place]) 12/02/2020  . Aphasia 11/26/2020  . Right sided weakness 11/26/2020  . Seizures ([place])   . History of CVA (cerebrovascular accident)   . History of DVT (deep vein thrombosis)   . OSA (obstructive sleep apnea)   . Thrombocytopenia ([place])   . Frequent falls 11/20/2020  . Subdural bleeding ([place]) 11/20/2020  . SDH (subdural hematoma) ([place]) 11/19/2020  . Pressure injury of skin 10/23/2020  . 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 Severe aortic stenosis   . Syncope 10/16/2020  . Gait abnormality 04/15/2020  . Confusion 11/16/2019  . Cerebrovascular accident (CVA) ([place]) 11/16/2019  . 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 Acute ischemic stroke ([place]) 03/15/2019  . PAD (peripheral artery disease) ([place]) 07/23/2018  . Ischemia of lower extremity 07/12/2016  . Acute blood loss anemia 07/12/2016  . Depression 07/12/2016  . BPH (benign prostatic hyperplasia) 07/12/2016  . AAA (abdominal aortic aneurysm) ([place])   . Atrial fibrillation ([place])   . Embolism ([place]) 07/02/2016  . CKD (chronic kidney disease), stage III ([place])   . Claudication ([place]) 06/04/2016  . Long term current use of anticoagulant 06/04/2016  . Abnormal ECG 06/04/2016  . Thoracic ascending aortic aneurysm ([place]) 04/22/2015  . Permanent atrial fibrillation ([employer]) 05/11/2013  . Mild aortic stenosis 05/11/2013  . Orthostatic hypotension 05/11/2013  . Hypercholesterolemia 05/11/2013   PCP:  Orpah Melter, MD Pharmacy:   [place], [place] - [address] [place] [address] Phone: [phone] Fax: [facility] [address], [place] [address] Phone: [phone] Fax: [place], [place] - [address] [place] [address] Phone: [phone] Fax: [phone]     Social Determinants of Health (SDOH) Interventions    Readmission Risk  Interventions Readmission Risk Prevention Plan 12/02/2020 08/15/2019  Transportation Screening Complete Complete  PCP or Specialist Appt within 5-7 Days Not Complete Complete  Not Complete comments INPT rehab -  Home Care Screening Complete Complete  Medication Review (RN CM) Complete Complete  Some recent data might be hidden   Reinaldo Raddle, RN, BSN  Trauma/Neuro ICU Case Manager [phone]

## 2020-12-25 NOTE — Progress Notes (Signed)
ANTICOAGULATION CONSULT NOTE  Pharmacy Consult for heparin Indication: acute bilateral DVT, stroke  Heparin Dosing Weight: 91 kg  Labs: Recent Labs    12/22/2020 2115 12/24/20 0514 12/24/20 2112 12/15/2020 0523 12/17/2020 1339  HGB 10.7* 9.9*  --  9.7*  --   HCT 32.9* 30.0*  --  29.8*  --   PLT 137* 126*  --  129*  --   HEPARINUNFRC  --   --  0.13* 0.31 0.33  CREATININE 1.18 1.16  --  1.18  --     Assessment: 89 yom s/p bur hole drainage of complex L chronic SDH last month presenting with acute CVA s/p IR for cerebral arteriogram, emergent mechanical thrombectomy on 3/21. Patient also found to have acute bilateral DVT. PTA apixababn has been held since 11/21/20 with SDH/crani with plan to hold x 6 weeks.  Neurosurgery cleared to begin heparin per stroke protocol on 3/22. Neurology and CCM in agreement. Patient did experience platelet drop to 30s in rehab on heparin SQ with improvement once d/c'd. HIT antibody resulted as negative. Per discussion with CCM, will cautiously start heparin and monitor plt trend for now. SDH appears to be almost fully resolved per Neurosurgery.  Heparin level 0.33 therapeutic on heparin drip rate 1250 units/hr. CBC stable. No issues with infusion or bleeding reported.  However, patient just had another stroke and is going down to IR - will follow up for plans after.   Goal of Therapy:  Heparin level 0.3-0.5 units/ml, no bolus per stroke protocol Monitor platelets by anticoagulation protocol: Yes   Plan:  Continue IV heparin at 1250 units/hr - or as per neuro advises after IR     Bonnita Nasuti Pharm.D. CPP, BCPS Clinical Pharmacist [phone] 12/18/2020 5:12 PM

## 2020-12-25 NOTE — Evaluation (Signed)
Clinical/Bedside Swallow Evaluation Patient Details  Name: Cesar [name] MRN: [id number] Date of Birth: 1928/12/09  Today's Date: 12/28/2020 Time: SLP Start Time (ACUTE ONLY): 1201 SLP Stop Time (ACUTE ONLY): 1222 SLP Time Calculation (min) (ACUTE ONLY): 21 min  Past Medical History:  Past Medical History:  Diagnosis Date  . AAA (abdominal aortic aneurysm) ([name])   . CKD (chronic kidney disease), stage III (H[facility])    baseline Cr 1.4-1.5 in 2016-2017 by PCP)  . CVA (cerebral infarction)   . Depression   . DVT (deep venous thrombosis) ([name]) 2006  . H/O blood clots   . Hematuria   . HOH (hard of hearing)   . Hypercholesterolemia   . OSA (obstructive sleep apnea)    intol to CPAP  . Permanent atrial fibrillation ([name])   . Prostate enlargement   . Pulmonary nodules   . 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 Buena Irish and Dr. Roxy Manns  . Thoracic ascending aortic aneurysm ([name])    4.5cm in 11/2018, stable since 2016  . TIA (transient ischemic attack)    Past Surgical History:  Past Surgical History:  Procedure Laterality Date  . BURR HOLE Left 11/21/2020   Procedure: LEFT Side BURR HOLES,;  Surgeon: [name], MD;  Location: [facility];  Service: Neurosurgery;  Laterality: Left;  . EMBOLECTOMY Left 07/02/2016   Procedure: EMBOLECTOMY LEFT FEMORAL ARTERY;  Surgeon: [name], MD;  Location: [facility];  Service: Vascular;  Laterality: Left;  . IR CT HEAD LTD  12/20/2020  . IR PERCUTANEOUS ART THROMBECTOMY/INFUSION INTRACRANIAL INC DIAG ANGIO  12/11/2020  . MESENTERIC ARTERY BYPASS N/A 08/06/2019   Procedure: SUPERIOR MESENTERIC ARTERY THROMBECTOMY;  Surgeon: [name], MD;  Location: [facility];  Service: Vascular;  Laterality: N/A;  . NM [place]  08/24/2002   markedly positive,subtle lateral ischemia  . PATCH ANGIOPLASTY  08/06/2019   Procedure: Patch Angioplasty of superior  messenteric artery with RIGHT femoral vein;  Surgeon: [name], MD;  Location: [facility];  Service: Vascular;;  . PROSTATE SURGERY    . RADIOLOGY WITH ANESTHESIA N/A 01/01/2021   Procedure: IR WITH ANESTHESIA;  Surgeon: Radiologist, Medication, MD;  Location: [facility];  Service: Radiology;  Laterality: N/A;  . RIGHT HEART CATH AND CORONARY ANGIOGRAPHY N/A 10/21/2020   Procedure: RIGHT HEART CATH AND CORONARY ANGIOGRAPHY;  Surgeon: [name], MD;  Location: [facility];  Service: Cardiovascular;  Laterality: N/A;  . TEE WITHOUT CARDIOVERSION N/A 10/22/2020   Procedure: TRANSESOPHAGEAL ECHOCARDIOGRAM (TEE);  Surgeon: [name], MD;  Location: [facility];  Service: Open Heart Surgery;  Laterality: N/A;  . TRANSCATHETER AORTIC VALVE REPLACEMENT, TRANSFEMORAL Right 10/22/2020   Procedure: TRANSCATHETER AORTIC VALVE REPLACEMENT, TRANSFEMORAL;  Surgeon: [name], MD;  Location: [facility];  Service: Open Heart Surgery;  Laterality: Right;  . US ECHOCARDIOGRAPHY  01/06/2012   mod. AOV ca+,mild to mod. AS,mod mostly posterior MAC-mild MR   HPI:  87 y.o. recently admitted for TAVR and discharged and re-admitted 2/15 with large, chronic subdural hematoma with bur hole drainage with drain placement.  Discharged to inpatient rehab 2/28 then to home on 3/17. MBS with ST  recommended puree/thin (ill fitting dentures, no aspiration). Presented 3/21 with UTI, possible ICA occlusion became increasingly confused and CTA showed occluded R ICA underwent emergent thrombectomy 3/21 and extubated 3/22.  BSE performed 3/22 without s/s aspiration  and regular texture/thin recommended and ST signed off. Morning of 3/23, per RN, pt had significant event marked by sudden prolonged coughing with sausage (cut up by wife), made NPO and ST re-consulted. PMH significant for AAA, stage III CKD, OSA, seizures, OSA, severe AS s/p TAVR 10/22/20   Assessment / Plan / Recommendation Clinical Impression   This SLP has seen pt for 3 swallow assessment's since end Feb 2022 and MBS 2/25. Despite diagnosed cervical osteophytes from CT, they did not pose increased risk during MBS but suspect possible esophageal involvement due to pill hesitation mid esophagus and eructation since hospitalization. Yesterday, there were no s/s aspiration at bedside. Suspect small piece of sausage penetrated airway due to his reduced alertness today or incomplete airway protection this morning. During today's BSE, he was awake but drowsy. There was no coughing or throat clearing with pudding, water or solid. Per wife he needs cues to slow his rate, this morning he was eating a slow pace. Pt consumed puree after SDH last month and would benefit from downgraded texture to puree, continue thin, pills crushed, Pt and family in agreement to plan. SLP Visit Diagnosis: Dysphagia, unspecified (R13.10)    Aspiration Risk  Moderate aspiration risk    Diet Recommendation Dysphagia 1 (Puree);Thin liquid   Liquid Administration via: Cup;Straw Medication Administration: Whole meds with puree Supervision: Patient able to self feed;Staff to assist with self feeding;Full supervision/cueing for compensatory strategies Compensations: Slow rate;Small sips/bites Postural Changes: Seated upright at 90 degrees;Remain upright for at least 30 minutes after po intake    Other  Recommendations Oral Care Recommendations: Oral care BID   Follow up Recommendations Inpatient Rehab      Frequency and Duration min 2x/week  2 weeks       Prognosis Prognosis for Safe Diet Advancement:  (fair-good) Barriers to Reach Goals: Cognitive deficits      Swallow Study   General HPI: 87 y.o. recently admitted for TAVR and discharged and re-admitted 2/15 with large, chronic subdural hematoma with bur hole drainage with drain placement.  Discharged to inpatient rehab 2/28 then to home on 3/17. MBS with ST  recommended puree/thin (ill fitting dentures, no  aspiration). Presented 3/21 with UTI, possible ICA occlusion became increasingly confused and CTA showed occluded R ICA underwent emergent thrombectomy 3/21 and extubated 3/22.  BSE performed 3/22 without s/s aspiration and regular texture/thin recommended and ST signed off. Morning of 3/23, per RN, pt had significant event marked by sudden prolonged coughing with sausage (cut up by wife), made NPO and ST re-consulted. PMH significant for AAA, stage III CKD, OSA, seizures, OSA, severe AS s/p TAVR 10/22/20 Type of Study: Bedside Swallow Evaluation Diet Prior to this Study: NPO Temperature Spikes Noted: No Respiratory Status: Room air History of Recent Intubation: Yes Length of Intubations (days): 1 days Date extubated: 12/24/20 Behavior/Cognition: Other (Comment);Cooperative (drowsy) Oral Cavity Assessment: Within Functional Limits Oral Care Completed by SLP: No Oral Cavity - Dentition: Dentures, top;Dentures, bottom Vision: Functional for self-feeding Self-Feeding Abilities: Needs assist Patient Positioning: Upright in bed Baseline Vocal Quality: Normal Volitional Cough: Weak    Oral/Motor/Sensory Function Overall Oral Motor/Sensory Function: Within functional limits Facial ROM: Within Functional Limits Facial Symmetry: Within Functional Limits Facial Strength: Within Functional Limits Facial Sensation: Reduced right Lingual ROM: Within Functional Limits Lingual Symmetry: Within Functional Limits   Ice Chips Ice chips: Not tested   Thin Liquid Thin Liquid: Within functional limits Presentation: Cup;Straw Oral Phase Functional Implications:  (no overt) Pharyngeal  Phase Impairments:  (  none)    Nectar Thick Nectar Thick Liquid: Not tested   Honey Thick Honey Thick Liquid: Not tested   Puree Puree: Within functional limits   Solid     Solid:  (no overt) Pharyngeal Phase Impairments:  (none)      Mick Sell, Orbie Pyo 12/06/2020,2:21 PM  [name].Ed  Risk analyst [phone] Office [phone]

## 2020-12-25 NOTE — Progress Notes (Signed)
Referring Physician(s): Code stroke- Kerney Elbe (neurology)  Supervising Physician: Luanne Bras  Patient Status:  [facility] - In-pt  Chief Complaint: Stroke F/U  Subjective:  History of acute CVA s/p cerebral arteriogram with emergent mechanical thrombectomy of right ICA terminus and right MCA occlusions achieving a TICI 3 revascularization via right femoral approach 12/17/2020 by Dr. Estanislado Pandy. Patient laying in bed resting comfortably. He opens eyes to voice and follows simple commands. No complaints. Can spontaneously move all extremities. Right femoral puncture site c/d/i.  MRA head 12/24/2020: 1. Degraded by motion. Right ICA is now patent. Suspect moderate narrowing at the clinoid.   Allergies: Flomax [tamsulosin hcl] and Levetiracetam  Medications: Prior to Admission medications   Medication Sig Start Date End Date Taking? Authorizing Provider  acetaminophen (TYLENOL) 325 MG tablet Take 2 tablets (650 mg total) by mouth every 6 (six) hours as needed for mild pain (or Fever >/= 101). 12/18/20  Yes Angiulli, Lavon Paganini, PA-C  aspirin EC 81 MG tablet Take 81 mg by mouth daily. Swallow whole.   Yes [provider]  atorvastatin (LIPITOR) 80 MG tablet Take 1 tablet (80 mg total) by mouth daily. 12/18/20  Yes Angiulli, Lavon Paganini, PA-C  b complex vitamins capsule Take 1 capsule by mouth daily.   Yes [provider]  divalproex (DEPAKOTE ER) 500 MG 24 hr tablet Take 2 tablets (1,000 mg total) by mouth at bedtime. 12/18/20  Yes Angiulli, Lavon Paganini, PA-C  docusate sodium (COLACE) 100 MG capsule Take 1 capsule (100 mg total) by mouth 2 (two) times daily. 12/18/20  Yes Angiulli, Lavon Paganini, PA-C  finasteride (PROSCAR) 5 MG tablet Take 1 tablet (5 mg total) by mouth daily. 12/18/20  Yes Angiulli, Lavon Paganini, PA-C  pantoprazole (PROTONIX) 40 MG tablet Take 40 mg by mouth every evening. 12/18/20  Yes [provider]     Vital Signs: BP 121/61 (BP Location: Right Arm)    Pulse 68   Temp (!) 97.3 F (36.3 C) (Axillary)   Resp 20   Ht 5\' 11"  (1.803 m)   Wt 200 lb 9.9 oz (91 kg)   SpO2 100%   BMI 27.98 kg/m   Physical Exam Vitals and nursing note reviewed.  Constitutional:      General: He is not in acute distress. Pulmonary:     Effort: Pulmonary effort is normal. No respiratory distress.  Skin:    General: Skin is warm and dry.     Comments: Right femoral puncture site soft without active bleeding or hematoma.  Neurological:     Mental Status: He is alert.     Comments: Alert, awake, and oriented x3. Speech and comprehension intact. PERRL bilaterally. No facial asymmetry. Tongue midline. Can spontaneously move all extremities. Distal pulses (DPs) 1+ bilaterally.     Imaging: CT ANGIO HEAD W OR WO CONTRAST  Result Date: 12/06/2020 CLINICAL DATA:  Neuro deficit, acute, stroke suspected. EXAM: CT ANGIOGRAPHY HEAD AND NECK CT PERFUSION BRAIN TECHNIQUE: Multidetector CT imaging of the head and neck was performed using the standard protocol during bolus administration of intravenous contrast. Multiplanar CT image reconstructions and MIPs were obtained to evaluate the vascular anatomy. Carotid stenosis measurements (when applicable) are obtained utilizing NASCET criteria, using the distal internal carotid diameter as the denominator. Multiphase CT imaging of the brain was performed following IV bolus contrast injection. Subsequent parametric perfusion maps were calculated using RAPID software. CONTRAST:  170mL OMNIPAQUE IOHEXOL 350 MG/ML SOLN COMPARISON:  Noncontrast head CT performed earlier  today 12/24/2020. Brain MRI performed earlier today 12/16/2020. MRA head 11/26/2020. FINDINGS: CTA NECK FINDINGS Aortic arch: Standard aortic branching atherosclerotic plaque within the visualized aortic arch and proximal major branch vessels of the neck. No hemodynamically significant innominate or proximal subclavian artery stenosis. Right carotid system: CCA  patent within the neck to the bifurcation. Soft and calcified plaque within the carotid bifurcation and proximal ICA. There is occlusion or near occlusion of the cervical right ICA beginning at the proximal to mid aspect of this vessel. Left carotid system: CCA and ICA patent within the neck. Soft and calcified plaque within the carotid bifurcation and proximal ICA with less than 50% stenosis Vertebral arteries: The right vertebral artery is developmentally diminutive, but patent throughout the neck dominant left vertebral artery, patent within the neck. Moderate/severe stenosis at the origin of the left vertebral artery. Minimal calcified plaque is also present within the proximal V2 segment without stenosis at this site Skeleton: No acute bony abnormality or aggressive osseous lesion. Cervical spondylosis. Other neck: No neck mass or cervical lymphadenopathy. Thyroid unremarkable. Upper chest: No consolidation within the imaged lung apices. Biapical pleuroparenchymal scarring. Partially imaged right pleural effusion. Review of the MIP images confirms the above findings CTA HEAD FINDINGS Anterior circulation: The right ICA remains occluded or near occluded intracranially throughout the siphon region. There is reconstitution of enhancement within the vessel at the level of the supraclinoid segment. The right M1 middle cerebral arteries patent. High-grade focal stenosis within a proximal M2 inferior division branch (series 9, image 25). Atherosclerotic irregularity of additional proximal to mid right M2 MCA branch vessels. Most notably, there is segmental high-grade stenosis within a superior division mid M2 right MCA branch (series 10, image 14). The intracranial left ICA is patent. Calcified plaque within moderate stenosis of the supraclinoid segment. The left M1 middle cerebral artery is patent. No left M2 proximal branch occlusion or high-grade proximal stenosis is identified. The anterior cerebral arteries are  patent. Posterior circulation: The intracranial vertebral arteries are patent. Mild calcified plaque within the intracranial left vertebral artery without stenosis. The posterior cerebral arteries are patent. A left posterior communicating artery is present. 2 mm medially projecting vascular protrusion arising from the supraclinoid right ICA which may reflect an aneurysm, infundibulum or origin of an otherwise occluded right posterior communicating artery. Venous sinuses: Within the limitations of contrast timing, no convincing thrombus. Anatomic variants: As described Review of the MIP images confirms the above findings CT Brain Perfusion Findings: CBF (<30%) Volume: 54mL Perfusion (Tmax>6.0s) volume: 175mL Mismatch Volume: Infinite Infarction Location:None identified These results were called by telephone at the time of interpretation on 12/14/2020 at 5:32 pm to provider ERIC [facility] , who verbally acknowledged these results. IMPRESSION: CTA neck: 1. The cervical right ICA becomes occluded or near occluded at its proximal to mid aspect. The vessel remains occluded or near occluded throughout the remainder of the neck. 2. Left CCA and ICA patent within the neck with less than 50% stenosis. 3. Vertebral arteries patent within the neck bilaterally. 4. Moderate/severe stenosis at the origin of the dominant left vertebral artery. CTA head: 1. The right ICA remains occluded or near occluded intracranially throughout the siphon region. There is reconstitution of enhancement within this vessel at the level of the supraclinoid segment. 2. High-grade focal stenosis within an inferior division proximal M2 right MCA branch vessel. 3. Atherosclerotic irregularity of additional proximal to mid right M2 MCA branch vessels. Most notably, there is segmental high-grade stenosis within a superior division  mid M2 right MCA branch. 4. 2 mm medially projecting vascular protrusion arising from the supraclinoid right ICA. This may reflect  an aneurysm, infundibulum or the origin of an otherwise occluded right posterior communicating artery. CT perfusion head: The perfusion software identifies no core infarct. The small acute infarcts demonstrated on the brain MRI performed earlier today are not detected. The perfusion software identifies a 109 mL region of hypoperfusion, predominantly within the right cerebral hemisphere. Electronically Signed   By: Kellie Simmering DO   On: 12/12/2020 17:48   CT HEAD WO CONTRAST  Result Date: 12/24/2020 CLINICAL DATA:  87 year old male with right ICA occlusion/near occlusion. Tiny right hemisphere infarcts on MRI. Chronic/resolving left subdural hematoma. Status post neurovascular revascularization of the ICA and MCA yesterday. Subsequent encounter. EXAM: CT HEAD WITHOUT CONTRAST TECHNIQUE: Contiguous axial images were obtained from the base of the skull through the vertex without intravenous contrast. COMPARISON:  CT head 0730 hours yesterday. FINDINGS: Brain: Small residual mixed density left subdural hematoma remains stable. No new intracranial hemorrhage identified. Patchy bilateral MCA territory chronic encephalomalacia and extensive bilateral white matter hypodensity. Stable gray-white matter differentiation since yesterday. No acute or evolving infarct identified by CT. Vascular: Calcified atherosclerosis at the skull base. No suspicious intracranial vascular hyperdensity. Skull: Stable.  Previous left frontal burr holes. Sinuses/Orbits: Visualized paranasal sinuses and mastoids are stable and well pneumatized. Other: Scalp and orbits soft tissues are stable. IMPRESSION: 1. Stable non contrast CT appearance of the brain since yesterday. Multifocal chronic ischemia with no acute or evolving infarct by CT. 2. No acute intracranial hemorrhage. Stable mixed density left subdural hematoma. Electronically Signed   By: Genevie Ann M.D.   On: 12/24/2020 08:18   CT Head Wo Contrast  Result Date: 01/01/2021 CLINICAL  DATA:  87 year old male with acute altered mental status, slurred speech and facial droop. Recent fall, subdural hematoma. EXAM: CT HEAD WITHOUT CONTRAST TECHNIQUE: Contiguous axial images were obtained from the base of the skull through the vertex without intravenous contrast. COMPARISON:  Head CT today at 0224 hours. Brain MRI 11/06/2020. FINDINGS: Brain: Residual mixed density left subdural collection, likely with a degree of postoperative dural thickening, is stable and measures 7 mm or smaller at most levels. As before there is no significant midline shift, only mild mass effect on the left hemisphere. Stable ventricle size and configuration. Bilateral MCA territory encephalomalacia, Patchy and confluent bilateral cerebral white matter hypodensity remains stable. Streak artifacts suspected in the pons now. No new intracranial hemorrhage identified. No new cortically based infarct identified. Vascular: Calcified atherosclerosis at the skull base.6 No suspicious intracranial vascular hyperdensity. 666 Skull: Stable left convexity burr holes. No acute osseous abnormality identified. Sinuses/Orbits: Visualized paranasal sinuses and mastoids are stable and well pneumatized. Other: No acute orbit or scalp soft tissue finding. IMPRESSION: 1. Stable since 0224 hours today.  No new intracranial abnormality. 2. Residual mixed density left subdural collection, 7-9 mm. Stable mild mass effect with no midline shift. 3. Chronic ischemic disease in both hemispheres. Electronically Signed   By: Genevie Ann M.D.   On: 12/28/2020 07:59   CT Head Wo Contrast  Result Date: 12/19/2020 CLINICAL DATA:  Fall from bed, recent subdural hematoma EXAM: CT HEAD WITHOUT CONTRAST TECHNIQUE: Contiguous axial images were obtained from the base of the skull through the vertex without intravenous contrast. COMPARISON:  CT 11/30/2020, MR 11/26/2020 FINDINGS: Brain: Redemonstration of the mixed age extra-axial blood products across the left  frontal convexity which appear decreased in size from  the prior exam now measuring up to a maximal thickness of 9 mm, previously 15 mm at a similar level. Resolution of the pneumocephaly seen on the comparison exam. No new sites of hemorrhage. Overlying craniotomy defects are noted with postsurgical change. Stable regions of encephalomalacia in the bilateral frontal lobes. Additional punctate foci of hypoattenuation of bilateral basal ganglia likely reflecting sequela of prior lacunar type infarcts. Findings on a background of diffuse chronic microvascular angiopathy and parenchymal volume loss. No significant residual mass effect or midline shift. Vascular: Atherosclerotic calcification of the carotid siphons and intradural vertebral arteries. No hyperdense vessel. Skull: Postsurgical changes on the left frontal scalp with craniotomy defects likely for evacuation of the previously seen subdural hematoma. Some slightly increased left supraorbital/frontal scalp swelling may be related to new contusive change, correlate for point tenderness. No other sites of significant scalp swelling or hematoma. No calvarial fracture or other acute osseous injury. Sinuses/Orbits: Minimal mural thickening in the paranasal sinuses. No layering air-fluid levels or pneumatized secretions. Bilateral lens extractions and right scleral buckle. Other: None. IMPRESSION: 1. Redemonstration of the attenuation extra-axial blood products across the left frontal convexity which appear decreased in size from the prior exam now measuring up to a maximal thickness of 9 mm, previously 15 mm at a similar level. Resolution of the pneumocephaly seen on the comparison exam. No new sites of hemorrhage. 2. Some slightly increased left supraorbital/frontal scalp swelling may be related to new contusive change, correlate for point tenderness. No calvarial fracture. 3. Stable regions of encephalomalacia in the bilateral frontal lobes and remote lacunar  infarcts in the basal ganglia. 4. Background of diffuse chronic microvascular angiopathy and parenchymal volume loss. 5. Intracranial atherosclerosis. Electronically Signed   By: Lovena Le M.D.   On: 12/19/2020 02:40   CT ANGIO NECK W OR WO CONTRAST  Result Date: 12/13/2020 CLINICAL DATA:  Neuro deficit, acute, stroke suspected. EXAM: CT ANGIOGRAPHY HEAD AND NECK CT PERFUSION BRAIN TECHNIQUE: Multidetector CT imaging of the head and neck was performed using the standard protocol during bolus administration of intravenous contrast. Multiplanar CT image reconstructions and MIPs were obtained to evaluate the vascular anatomy. Carotid stenosis measurements (when applicable) are obtained utilizing NASCET criteria, using the distal internal carotid diameter as the denominator. Multiphase CT imaging of the brain was performed following IV bolus contrast injection. Subsequent parametric perfusion maps were calculated using RAPID software. CONTRAST:  135mL OMNIPAQUE IOHEXOL 350 MG/ML SOLN COMPARISON:  Noncontrast head CT performed earlier today 12/24/2020. Brain MRI performed earlier today 12/22/2020. MRA head 11/26/2020. FINDINGS: CTA NECK FINDINGS Aortic arch: Standard aortic branching atherosclerotic plaque within the visualized aortic arch and proximal major branch vessels of the neck. No hemodynamically significant innominate or proximal subclavian artery stenosis. Right carotid system: CCA patent within the neck to the bifurcation. Soft and calcified plaque within the carotid bifurcation and proximal ICA. There is occlusion or near occlusion of the cervical right ICA beginning at the proximal to mid aspect of this vessel. Left carotid system: CCA and ICA patent within the neck. Soft and calcified plaque within the carotid bifurcation and proximal ICA with less than 50% stenosis Vertebral arteries: The right vertebral artery is developmentally diminutive, but patent throughout the neck dominant left vertebral  artery, patent within the neck. Moderate/severe stenosis at the origin of the left vertebral artery. Minimal calcified plaque is also present within the proximal V2 segment without stenosis at this site Skeleton: No acute bony abnormality or aggressive osseous lesion. Cervical spondylosis. Other neck:  No neck mass or cervical lymphadenopathy. Thyroid unremarkable. Upper chest: No consolidation within the imaged lung apices. Biapical pleuroparenchymal scarring. Partially imaged right pleural effusion. Review of the MIP images confirms the above findings CTA HEAD FINDINGS Anterior circulation: The right ICA remains occluded or near occluded intracranially throughout the siphon region. There is reconstitution of enhancement within the vessel at the level of the supraclinoid segment. The right M1 middle cerebral arteries patent. High-grade focal stenosis within a proximal M2 inferior division branch (series 9, image 25). Atherosclerotic irregularity of additional proximal to mid right M2 MCA branch vessels. Most notably, there is segmental high-grade stenosis within a superior division mid M2 right MCA branch (series 10, image 14). The intracranial left ICA is patent. Calcified plaque within moderate stenosis of the supraclinoid segment. The left M1 middle cerebral artery is patent. No left M2 proximal branch occlusion or high-grade proximal stenosis is identified. The anterior cerebral arteries are patent. Posterior circulation: The intracranial vertebral arteries are patent. Mild calcified plaque within the intracranial left vertebral artery without stenosis. The posterior cerebral arteries are patent. A left posterior communicating artery is present. 2 mm medially projecting vascular protrusion arising from the supraclinoid right ICA which may reflect an aneurysm, infundibulum or origin of an otherwise occluded right posterior communicating artery. Venous sinuses: Within the limitations of contrast timing, no  convincing thrombus. Anatomic variants: As described Review of the MIP images confirms the above findings CT Brain Perfusion Findings: CBF (<30%) Volume: 23mL Perfusion (Tmax>6.0s) volume: 127mL Mismatch Volume: Infinite Infarction Location:None identified These results were called by telephone at the time of interpretation on 12/16/2020 at 5:32 pm to provider ERIC [facility] , who verbally acknowledged these results. IMPRESSION: CTA neck: 1. The cervical right ICA becomes occluded or near occluded at its proximal to mid aspect. The vessel remains occluded or near occluded throughout the remainder of the neck. 2. Left CCA and ICA patent within the neck with less than 50% stenosis. 3. Vertebral arteries patent within the neck bilaterally. 4. Moderate/severe stenosis at the origin of the dominant left vertebral artery. CTA head: 1. The right ICA remains occluded or near occluded intracranially throughout the siphon region. There is reconstitution of enhancement within this vessel at the level of the supraclinoid segment. 2. High-grade focal stenosis within an inferior division proximal M2 right MCA branch vessel. 3. Atherosclerotic irregularity of additional proximal to mid right M2 MCA branch vessels. Most notably, there is segmental high-grade stenosis within a superior division mid M2 right MCA branch. 4. 2 mm medially projecting vascular protrusion arising from the supraclinoid right ICA. This may reflect an aneurysm, infundibulum or the origin of an otherwise occluded right posterior communicating artery. CT perfusion head: The perfusion software identifies no core infarct. The small acute infarcts demonstrated on the brain MRI performed earlier today are not detected. The perfusion software identifies a 109 mL region of hypoperfusion, predominantly within the right cerebral hemisphere. Electronically Signed   By: Kellie Simmering DO   On: 12/03/2020 17:48   MR ANGIO HEAD WO CONTRAST  Result Date: 12/24/2020 CLINICAL  DATA:  ICA occlusion post revascularization EXAM: MRA HEAD WITHOUT CONTRAST TECHNIQUE: Angiographic images of the Circle of Willis were obtained using MRA technique without intravenous contrast. COMPARISON:  None. FINDINGS: Motion artifact is present. Minimally included distal cervical right ICA and intracranial right ICA patent. Suspect moderate narrowing at the clinoid. Middle and anterior cerebral arteries are patent. Partially included intracranial vertebral arteries, basilar artery, posterior cerebral arteries are patent. IMPRESSION:  Degraded by motion. Right ICA is now patent. Suspect moderate narrowing at the clinoid. Electronically Signed   By: Macy Mis M.D.   On: 12/24/2020 17:42   MR BRAIN WO CONTRAST  Result Date: 12/03/2020 CLINICAL DATA:  Mental status change. Burr hole drainage of subdural hematoma 11/21/2020 EXAM: MRI HEAD WITHOUT CONTRAST TECHNIQUE: Multiplanar, multiecho pulse sequences of the brain and surrounding structures were obtained without intravenous contrast. COMPARISON:  MRI head 11/26/2020.  CT head 12/08/2020 FINDINGS: Brain: Multiple small areas of restricted diffusion compatible with acute infarcts involving the right basal ganglia, right parietal white matter, and right frontal parietal white matter, and right parietal cortex. These were not present on the MRI of 11/27/2019. Previous area of restricted diffusion in the left hippocampus has resolved likely due to recent infarct. Subacute subdural hematoma on the left is unchanged measuring approximately 7 mm in thickness. Ventricle size normal. No midline shift There is generalized atrophy. Moderate white matter changes bilaterally most likely chronic microvascular ischemia Motion degraded study.  Patient terminated the procedure early Vascular: Loss of flow void in the right internal carotid artery compatible with occlusion. This was patent on the prior MRI and MRA. Skull and upper cervical spine: No focal skeletal  abnormality. Sinuses/Orbits: Paranasal sinuses clear. Bilateral cataract extraction. Other: None IMPRESSION: Multiple small areas of acute infarct in the right cerebral hemisphere. Interval resolution of small infarct in the left hippocampus. Loss of flow void right internal carotid artery compatible with interval occlusion Subacute subdural hematoma left 7 mm, unchanged. These results were called by telephone at the time of interpretation on 12/29/2020 at 4:26 pm to provider [facility] , who verbally acknowledged these results. Electronically Signed   By: Franchot Gallo M.D.   On: 12/13/2020 16:27   IR CT Head Ltd  Result Date: 12/10/2020 INDICATION: New onset slurred speech, confusion and left-sided weakness and right gaze preference. EXAM: 1. EMERGENT LARGE VESSEL OCCLUSION THROMBOLYSIS (anterior CIRCULATION) COMPARISON:  MRI scan 12/03/2020, and CT angiogram of the head and neck of 12/05/2020. MEDICATIONS: Ancef 2 g IV antibiotic was administered within 1 hour of the procedure. ANESTHESIA/SEDATION: General anesthesia CONTRAST:  Isovue 300 50 mL FLUOROSCOPY TIME:  Fluoroscopy Time: 45 minutes 36 seconds (1333 mGy). COMPLICATIONS: None immediate. TECHNIQUE: Following a full explanation of the procedure along with the potential associated complications, an informed witnessed consent was obtained. The risks of intracranial hemorrhage of 10%, worsening neurological deficit, ventilator dependency, death and inability to revascularize were all reviewed in detail with the patient's family. The patient was then put under general anesthesia by the Department of Anesthesiology at [facility]. The right groin was prepped and draped in the usual sterile fashion. Thereafter using modified Seldinger technique, transfemoral access into the right common femoral artery was obtained without difficulty. Over a 0.035 inch guidewire an 8 French 25 cm Pinnacle sheath was inserted. Through this, and also over a 0.035  inch guidewire a 5 French Simmons 2 support catheter inside an 087 balloon guide catheter was advanced to the aortic arch region and selectively positioned in the right common carotid artery at the bifurcation. The support Simmons 2 and catheter, and the guidewire were removed. Good aspiration obtained from the hub of the balloon guide catheter at the right common carotid bifurcation. FINDINGS: A gentle control arteriogram performed through the balloon guide catheter demonstrated right external carotid artery and its branches to be widely patent. The right internal carotid artery at the bulb and just distally demonstrates complete angiographic occlusion.  No evidence of a delayed string sign noted. No distal reconstitution of the right middle cerebral artery from the external carotid artery branches was noted either. PROCEDURE: Through the balloon guide catheter, over a 016 inch Fathom micro guidewire a 162 cm 021 Trevo ProVue microcatheter inside an 071 130 cm Zoom aspiration catheter combination was advanced gingerly, slowly and deliberately into the right middle cerebral artery inferior division M2 segment followed by the microcatheter. Zoom aspiration catheter was advanced into the mid M1 segment. A 6.5 mm x 45 mm Embotrap retrieval device was then advanced to the distal end of the microcatheter. This was then deployed by retrieving the microcatheter which was then retrieved further into the right cervical ICA. Thereafter, with proximal flow arrest in the right internal carotid artery with the balloon guide catheter, and constant aspiration at the hub of the balloon guide catheter with a 20 mL syringe, and with Penumbra aspiration device at the hub 071 Zoom aspiration catheter for approximately 2 minutes, the combination was retrieved and removed. Following reversal of flow arrest, a control arteriogram performed through the balloon guide in the right common carotid artery demonstrated complete revascularization  of the right middle cerebral artery and the right anterior cerebral artery distributions. No evidence of irregularities or of intimal flaps or filling defects were seen within the internal carotid artery extra cranially or intracranially. Extensive clots were seen in the canister of the aspiration device. Few clots were seen entangled in the retrieval device itself. Throughout the procedure the patient's blood pressure and neurological status remained stable. Balloon guide was removed. The 8 French neurovascular sheath was removed and replaced with an 8 French Angio-Seal closure device for hemostasis. The right groin appeared soft without evidence of hemorrhage. Distal pulses remained Dopplerable in both lower extremities. Post CT of the brain demonstrated no evidence of intracranial hemorrhage, mass effect or midline shift. Patient was left intubated to protect the airway. Patient was then transferred to neuro ICU for post thrombectomy management. IMPRESSION: Status post endovascular complete revascularization of occluded internal carotid artery extra cranially and intracranially, and of the right middle cerebral artery with 1 pass with 6.5 mm x 45 mm Embotrap retrieval device with aspiration and proximal flow arrest achieving a TICI 3 revascularization. PLAN: Follow-up in the clinic 4 weeks post discharge p.r.n. Electronically Signed   By: Luanne Bras M.D.   On: 12/24/2020 15:12   CT CEREBRAL PERFUSION W CONTRAST  Result Date: 12/20/2020 CLINICAL DATA:  Neuro deficit, acute, stroke suspected. EXAM: CT ANGIOGRAPHY HEAD AND NECK CT PERFUSION BRAIN TECHNIQUE: Multidetector CT imaging of the head and neck was performed using the standard protocol during bolus administration of intravenous contrast. Multiplanar CT image reconstructions and MIPs were obtained to evaluate the vascular anatomy. Carotid stenosis measurements (when applicable) are obtained utilizing NASCET criteria, using the distal internal  carotid diameter as the denominator. Multiphase CT imaging of the brain was performed following IV bolus contrast injection. Subsequent parametric perfusion maps were calculated using RAPID software. CONTRAST:  132mL OMNIPAQUE IOHEXOL 350 MG/ML SOLN COMPARISON:  Noncontrast head CT performed earlier today 12/24/2020. Brain MRI performed earlier today 12/11/2020. MRA head 11/26/2020. FINDINGS: CTA NECK FINDINGS Aortic arch: Standard aortic branching atherosclerotic plaque within the visualized aortic arch and proximal major branch vessels of the neck. No hemodynamically significant innominate or proximal subclavian artery stenosis. Right carotid system: CCA patent within the neck to the bifurcation. Soft and calcified plaque within the carotid bifurcation and proximal ICA. There is occlusion or near  occlusion of the cervical right ICA beginning at the proximal to mid aspect of this vessel. Left carotid system: CCA and ICA patent within the neck. Soft and calcified plaque within the carotid bifurcation and proximal ICA with less than 50% stenosis Vertebral arteries: The right vertebral artery is developmentally diminutive, but patent throughout the neck dominant left vertebral artery, patent within the neck. Moderate/severe stenosis at the origin of the left vertebral artery. Minimal calcified plaque is also present within the proximal V2 segment without stenosis at this site Skeleton: No acute bony abnormality or aggressive osseous lesion. Cervical spondylosis. Other neck: No neck mass or cervical lymphadenopathy. Thyroid unremarkable. Upper chest: No consolidation within the imaged lung apices. Biapical pleuroparenchymal scarring. Partially imaged right pleural effusion. Review of the MIP images confirms the above findings CTA HEAD FINDINGS Anterior circulation: The right ICA remains occluded or near occluded intracranially throughout the siphon region. There is reconstitution of enhancement within the vessel at the  level of the supraclinoid segment. The right M1 middle cerebral arteries patent. High-grade focal stenosis within a proximal M2 inferior division branch (series 9, image 25). Atherosclerotic irregularity of additional proximal to mid right M2 MCA branch vessels. Most notably, there is segmental high-grade stenosis within a superior division mid M2 right MCA branch (series 10, image 14). The intracranial left ICA is patent. Calcified plaque within moderate stenosis of the supraclinoid segment. The left M1 middle cerebral artery is patent. No left M2 proximal branch occlusion or high-grade proximal stenosis is identified. The anterior cerebral arteries are patent. Posterior circulation: The intracranial vertebral arteries are patent. Mild calcified plaque within the intracranial left vertebral artery without stenosis. The posterior cerebral arteries are patent. A left posterior communicating artery is present. 2 mm medially projecting vascular protrusion arising from the supraclinoid right ICA which may reflect an aneurysm, infundibulum or origin of an otherwise occluded right posterior communicating artery. Venous sinuses: Within the limitations of contrast timing, no convincing thrombus. Anatomic variants: As described Review of the MIP images confirms the above findings CT Brain Perfusion Findings: CBF (<30%) Volume: 86mL Perfusion (Tmax>6.0s) volume: 13mL Mismatch Volume: Infinite Infarction Location:None identified These results were called by telephone at the time of interpretation on 12/28/2020 at 5:32 pm to provider [facility] , who verbally acknowledged these results. IMPRESSION: CTA neck: 1. The cervical right ICA becomes occluded or near occluded at its proximal to mid aspect. The vessel remains occluded or near occluded throughout the remainder of the neck. 2. Left CCA and ICA patent within the neck with less than 50% stenosis. 3. Vertebral arteries patent within the neck bilaterally. 4. Moderate/severe  stenosis at the origin of the dominant left vertebral artery. CTA head: 1. The right ICA remains occluded or near occluded intracranially throughout the siphon region. There is reconstitution of enhancement within this vessel at the level of the supraclinoid segment. 2. High-grade focal stenosis within an inferior division proximal M2 right MCA branch vessel. 3. Atherosclerotic irregularity of additional proximal to mid right M2 MCA branch vessels. Most notably, there is segmental high-grade stenosis within a superior division mid M2 right MCA branch. 4. 2 mm medially projecting vascular protrusion arising from the supraclinoid right ICA. This may reflect an aneurysm, infundibulum or the origin of an otherwise occluded right posterior communicating artery. CT perfusion head: The perfusion software identifies no core infarct. The small acute infarcts demonstrated on the brain MRI performed earlier today are not detected. The perfusion software identifies a 109 mL region of hypoperfusion, predominantly  within the right cerebral hemisphere. Electronically Signed   By: Kellie Simmering DO   On: 12/15/2020 17:48   DG CHEST PORT 1 VIEW  Result Date: 12/03/2020 CLINICAL DATA:  Intubation EXAM: PORTABLE CHEST 1 VIEW COMPARISON:  None. FINDINGS: Endotracheal tube is 7 cm above the carina. NG tube enters the stomach, the tip projecting off the lower aspect of the image. Heart is borderline in size. Prior TAVR. Mitral valve annular calcifications. No confluent airspace opacities or effusions. IMPRESSION: Endotracheal tube 7 cm above the carina. No acute cardiopulmonary disease. Electronically Signed   By: Rolm Baptise M.D.   On: 12/09/2020 22:04   DG Chest Port 1 View  Result Date: 12/22/2020 CLINICAL DATA:  87 year old male with sepsis. EXAM: PORTABLE CHEST 1 VIEW COMPARISON:  Chest radiograph dated 11/27/2020. FINDINGS: No focal consolidation, pleural effusion, or pneumothorax. Mild cardiomegaly. Atherosclerotic  calcification of the aorta. Aortic valve repair and calcification of the mitral annulus. No acute osseous pathology. IMPRESSION: 1. No acute cardiopulmonary process. 2. Mild cardiomegaly. Electronically Signed   By: Anner Crete M.D.   On: 12/20/2020 02:43   EEG adult  Result Date: 12/22/2020 Raenette Rover, MD     12/16/2020  1:03 PM EEG Report Indication: Altered mental status--known L SDH/craniotomy This study was recorded in the waking/drowsy vs encephalopathic state.  The duration of the study was 28 minutes.  Electrodes were placed according to the International 10/20 system.  Video was reviewed for clinical correlation as needed.  There is some degree of waking organization during periods where there is muscle artifact indicative of a more alert state--there is a discernible though very attenuated anterior - posterior voltage and frequency gradient.  In the occipital leads there is a possible (definitive reactivity is not confirmed) but symmetric posterior dominant rhythm of approximately 7 hertz, which is significantly slower than expected for age, very poorly sustained and indistinct.  Anteriorly, is the expected pattern of faster frequency, lower voltage waveforms though in a significantly reduced amount as compared to normal.  There is intermittent delta slowing in the anterior>posterior leads which is grossly symmetric.  In general, waveforms are slightly higher in amplitude in the left hemisphere and better defined, although this is most likely related to breach artifact.  In addition, early loss of leads in the right posterior quadrant makes the comparison difficult. During the less alert (no muscle artifact/blink artifiact) there is the disappearance of the gradient, a general shift to slower frequencies, disappearance of the suspected posterior dominant rhythm, but no sleep architecture.  Hyperventilation: deferred Photic stimulation: deferred  There are no clear paroxysmal or  epileptiform abnormalities or interhemispheric asymmetries seen during this portion of the recording.  Impression:  This is an abnormal waking and drowsy study due to very attenuated organization as described above.  In addition, there is some degree of focality, with more distinct waveforms/slightly higher amplitudes in the left hemisphere--this is favored to be due to breach artifact however, and early lead loss in the right posterior quadrant makes an exact comparison difficult.  These findings are most consistent with a mild-moderate diffuse encephalopathy, with possible/not definitive increased areas of focal neuronal dysfunction in the right (artifact is more favored) and possibly in the anterior regions (bilaterally).  There are no clear epileptiform abnormalities.  IR PERCUTANEOUS ART THROMBECTOMY/INFUSION INTRACRANIAL INC DIAG ANGIO  Result Date: 12/08/2020 INDICATION: New onset slurred speech, confusion and left-sided weakness and right gaze preference. EXAM: 1. EMERGENT LARGE VESSEL OCCLUSION THROMBOLYSIS (anterior CIRCULATION) COMPARISON:  MRI scan 12/06/2020, and CT angiogram of the head and neck of 12/28/2020. MEDICATIONS: Ancef 2 g IV antibiotic was administered within 1 hour of the procedure. ANESTHESIA/SEDATION: General anesthesia CONTRAST:  Isovue 300 50 mL FLUOROSCOPY TIME:  Fluoroscopy Time: 45 minutes 36 seconds (1333 mGy). COMPLICATIONS: None immediate. TECHNIQUE: Following a full explanation of the procedure along with the potential associated complications, an informed witnessed consent was obtained. The risks of intracranial hemorrhage of 10%, worsening neurological deficit, ventilator dependency, death and inability to revascularize were all reviewed in detail with the patient's family. The patient was then put under general anesthesia by the Department of Anesthesiology at [facility]. The right groin was prepped and draped in the usual sterile fashion. Thereafter using  modified Seldinger technique, transfemoral access into the right common femoral artery was obtained without difficulty. Over a 0.035 inch guidewire an 8 French 25 cm Pinnacle sheath was inserted. Through this, and also over a 0.035 inch guidewire a 5 French Simmons 2 support catheter inside an 087 balloon guide catheter was advanced to the aortic arch region and selectively positioned in the right common carotid artery at the bifurcation. The support Simmons 2 and catheter, and the guidewire were removed. Good aspiration obtained from the hub of the balloon guide catheter at the right common carotid bifurcation. FINDINGS: A gentle control arteriogram performed through the balloon guide catheter demonstrated right external carotid artery and its branches to be widely patent. The right internal carotid artery at the bulb and just distally demonstrates complete angiographic occlusion. No evidence of a delayed string sign noted. No distal reconstitution of the right middle cerebral artery from the external carotid artery branches was noted either. PROCEDURE: Through the balloon guide catheter, over a 016 inch Fathom micro guidewire a 162 cm 021 Trevo ProVue microcatheter inside an 071 130 cm Zoom aspiration catheter combination was advanced gingerly, slowly and deliberately into the right middle cerebral artery inferior division M2 segment followed by the microcatheter. Zoom aspiration catheter was advanced into the mid M1 segment. A 6.5 mm x 45 mm Embotrap retrieval device was then advanced to the distal end of the microcatheter. This was then deployed by retrieving the microcatheter which was then retrieved further into the right cervical ICA. Thereafter, with proximal flow arrest in the right internal carotid artery with the balloon guide catheter, and constant aspiration at the hub of the balloon guide catheter with a 20 mL syringe, and with Penumbra aspiration device at the hub 071 Zoom aspiration catheter for  approximately 2 minutes, the combination was retrieved and removed. Following reversal of flow arrest, a control arteriogram performed through the balloon guide in the right common carotid artery demonstrated complete revascularization of the right middle cerebral artery and the right anterior cerebral artery distributions. No evidence of irregularities or of intimal flaps or filling defects were seen within the internal carotid artery extra cranially or intracranially. Extensive clots were seen in the canister of the aspiration device. Few clots were seen entangled in the retrieval device itself. Throughout the procedure the patient's blood pressure and neurological status remained stable. Balloon guide was removed. The 8 French neurovascular sheath was removed and replaced with an 8 French Angio-Seal closure device for hemostasis. The right groin appeared soft without evidence of hemorrhage. Distal pulses remained Dopplerable in both lower extremities. Post CT of the brain demonstrated no evidence of intracranial hemorrhage, mass effect or midline shift. Patient was left intubated to protect the airway. Patient was then transferred to neuro  ICU for post thrombectomy management. IMPRESSION: Status post endovascular complete revascularization of occluded internal carotid artery extra cranially and intracranially, and of the right middle cerebral artery with 1 pass with 6.5 mm x 45 mm Embotrap retrieval device with aspiration and proximal flow arrest achieving a TICI 3 revascularization. PLAN: Follow-up in the clinic 4 weeks post discharge p.r.n. Electronically Signed   By: Luanne Bras M.D.   On: 12/24/2020 15:12   VAS [place] LOWER EXTREMITY VENOUS (DVT)  Result Date: 12/10/2020  Lower Venous DVT Study Indications: Swelling, and Edema.  Limitations: Patient's positioning and altered mental status. Comparison Study: no prior Performing Technologist: Sharion Dove RVS  Examination Guidelines: A complete  evaluation includes B-mode imaging, spectral Doppler, color Doppler, and power Doppler as needed of all accessible portions of each vessel. Bilateral testing is considered an integral part of a complete examination. Limited examinations for reoccurring indications may be performed as noted. The reflux portion of the exam is performed with the patient in reverse Trendelenburg.  +---------+---------------+---------+-----------+----------+-------------------+ RIGHT    CompressibilityPhasicitySpontaneityPropertiesThrombus Aging      +---------+---------------+---------+-----------+----------+-------------------+ CFV      Full                                                             +---------+---------------+---------+-----------+----------+-------------------+ SFJ      Full                                                             +---------+---------------+---------+-----------+----------+-------------------+ FV Prox  Full                                                             +---------+---------------+---------+-----------+----------+-------------------+ FV Mid   Full                                                             +---------+---------------+---------+-----------+----------+-------------------+ FV DistalFull                                                             +---------+---------------+---------+-----------+----------+-------------------+ PFV      Full                                                             +---------+---------------+---------+-----------+----------+-------------------+ POP  patent by color and                                                       Doppler             +---------+---------------+---------+-----------+----------+-------------------+ PTV      None                                         Acute                +---------+---------------+---------+-----------+----------+-------------------+ PERO     None                                         Acute               +---------+---------------+---------+-----------+----------+-------------------+   +---------+---------------+---------+-----------+----------+-------------------+ LEFT     CompressibilityPhasicitySpontaneityPropertiesThrombus Aging      +---------+---------------+---------+-----------+----------+-------------------+ CFV      Full           Yes      Yes                                      +---------+---------------+---------+-----------+----------+-------------------+ SFJ      Full                                                             +---------+---------------+---------+-----------+----------+-------------------+ FV Prox  Full                                                             +---------+---------------+---------+-----------+----------+-------------------+ FV Mid   Full                                                             +---------+---------------+---------+-----------+----------+-------------------+ FV DistalFull                                                             +---------+---------------+---------+-----------+----------+-------------------+ PFV      Full                                                             +---------+---------------+---------+-----------+----------+-------------------+  POP                                                   patent by color and                                                       Doppler             +---------+---------------+---------+-----------+----------+-------------------+ PTV      Full                                                             +---------+---------------+---------+-----------+----------+-------------------+ PERO     None                                         Acute                +---------+---------------+---------+-----------+----------+-------------------+     Summary: RIGHT: - Findings consistent with acute deep vein thrombosis involving the right posterior tibial veins, and right peroneal veins.  LEFT: - Findings consistent with acute deep vein thrombosis involving the left peroneal veins.  *See table(s) above for measurements and observations. Electronically signed by Servando Snare MD on 12/29/2020 at 10:18:06 PM.    Final     Labs:  CBC: Recent Labs    12/21/2020 0732 12/27/2020 1037 12/28/2020 2038 12/19/2020 2115 12/24/20 0514 12/08/2020 0523  WBC 15.1*  --   --  11.7* 9.2 7.0  HGB 10.3*   < > 9.5* 10.7* 9.9* 9.7*  HCT 32.0*   < > 28.0* 32.9* 30.0* 29.8*  PLT 147*  --   --  137* 126* 129*   < > = values in this interval not displayed.    COAGS: Recent Labs    10/19/20 0324 10/19/20 1424 10/22/20 0456 11/19/20 1751  INR  --   --  1.1 1.3*  APTT 123* 108* 35 36    BMP: Recent Labs    08/12/20 1137 10/16/20 1000 10/30/20 1520 11/19/20 1751 12/29/2020 0745 12/17/2020 1037 01/02/2021 2038 12/17/2020 2115 12/24/20 0514 01/02/2021 0523  NA 142   < > 141   < > 139   < > 140 136 139 136  K 4.8   < > 4.9   < > 4.5   < > 4.0 3.9 4.1 3.9  CL 108*   < > 104   < > 109  --   --  109 110 109  CO2 22   < > 23   < > 23  --   --  21* 21* 23  GLUCOSE 104*   < > 119*   < > 112*  --   --  100* 105* 93  BUN 24   < > 28*   < > 23  --   --  21 22 21   CALCIUM 9.1   < > 8.8   < >  8.4*  --   --  8.0* 8.1* 7.8*  CREATININE 1.35*   < > 1.28*   < > 1.44*  --   --  1.18 1.16 1.18  GFRNONAA 46*   < > 49*   < > 46*  --   --  59* >60 59*  GFRAA 53*  --  57*  --   --   --   --   --   --   --    < > = values in this interval not displayed.    LIVER FUNCTION TESTS: Recent Labs    11/19/20 1751 12/01/20 0610 12/03/20 0509 12/12/2020 0218  BILITOT 0.9 0.7 1.0 0.8  AST 28 23 23 27   ALT 22 15 17 17   ALKPHOS 59 50 50 42  PROT 6.6 5.7* 6.1* 5.9*  ALBUMIN 3.1* 2.7* 2.8* 2.6*     Assessment and Plan:  History of acute CVA s/p cerebral arteriogram with emergent mechanical thrombectomy of right ICA terminus and right MCA occlusions achieving a TICI 3 revascularization via right femoral approach 12/20/2020 by Dr. Estanislado Pandy. Patient's condition improving- awake and alert, follows simple commands, can spontaneously move all extremities. Right femoral puncture site stable, distal pulses (DPs) 1+ bilaterally. Plan for follow-up with Dr. Estanislado Pandy with carotid US 3 months after discharge ([facility] schedulers to call patient to set up this imaging scan). Further plans per neurology/CCM- appreciate and agree with management. Please call [facility] with questions/concerns.   Electronically Signed: Earley Abide, PA-C 12/06/2020, 11:08 AM   I spent a total of 15 Minutes at the the patient's bedside AND on the patient's hospital floor or unit, greater than 50% of which was counseling/coordinating care for CVA s/p revascularization.

## 2020-12-25 NOTE — Transfer of Care (Signed)
Immediate Anesthesia Transfer of Care Note  Patient: Cesar [name]  Procedure(s) Performed: IR WITH ANESTHESIA - CODE STROKE (N/A )  Patient Location: NICU  Anesthesia Type:General  Level of Consciousness: Patient remains intubated per anesthesia plan  Airway & Oxygen Therapy: Patient placed on Ventilator (see vital sign flow sheet for setting)  Post-op Assessment: Report given to RN and Post -op Vital signs reviewed and stable  Post vital signs: Reviewed and stable  Last Vitals:  Vitals Value Taken Time  BP    Temp    Pulse 64 12/16/2020 2001  Resp 17 12/19/2020 2001  SpO2 100 % 12/09/2020 2001  Vitals shown include unvalidated device data.  Last Pain:  Vitals:   12/13/2020 1600  TempSrc: Axillary  PainSc:          Complications: No complications documented.

## 2020-12-26 ENCOUNTER — Inpatient Hospital Stay (HOSPITAL_COMMUNITY): Payer: Medicare Other | Admitting: Anesthesiology

## 2020-12-26 ENCOUNTER — Other Ambulatory Visit (HOSPITAL_COMMUNITY): Payer: Medicare Other

## 2020-12-26 ENCOUNTER — Inpatient Hospital Stay (HOSPITAL_COMMUNITY): Payer: Medicare Other

## 2020-12-26 ENCOUNTER — Encounter (HOSPITAL_COMMUNITY): Payer: Self-pay | Admitting: Radiology

## 2020-12-26 ENCOUNTER — Encounter (HOSPITAL_COMMUNITY): Admission: EM | Disposition: E | Payer: Self-pay | Source: Home / Self Care | Attending: Neurology

## 2020-12-26 DIAGNOSIS — I350 Nonrheumatic aortic (valve) stenosis: Secondary | ICD-10-CM

## 2020-12-26 DIAGNOSIS — I6601 Occlusion and stenosis of right middle cerebral artery: Secondary | ICD-10-CM

## 2020-12-26 DIAGNOSIS — I63411 Cerebral infarction due to embolism of right middle cerebral artery: Secondary | ICD-10-CM | POA: Diagnosis not present

## 2020-12-26 DIAGNOSIS — I998 Other disorder of circulatory system: Secondary | ICD-10-CM

## 2020-12-26 DIAGNOSIS — R9439 Abnormal result of other cardiovascular function study: Secondary | ICD-10-CM

## 2020-12-26 DIAGNOSIS — G9341 Metabolic encephalopathy: Secondary | ICD-10-CM

## 2020-12-26 DIAGNOSIS — R230 Cyanosis: Secondary | ICD-10-CM

## 2020-12-26 DIAGNOSIS — I63231 Cerebral infarction due to unspecified occlusion or stenosis of right carotid arteries: Secondary | ICD-10-CM | POA: Diagnosis not present

## 2020-12-26 DIAGNOSIS — R569 Unspecified convulsions: Secondary | ICD-10-CM | POA: Diagnosis not present

## 2020-12-26 DIAGNOSIS — I714 Abdominal aortic aneurysm, without rupture: Secondary | ICD-10-CM

## 2020-12-26 DIAGNOSIS — J9601 Acute respiratory failure with hypoxia: Secondary | ICD-10-CM

## 2020-12-26 DIAGNOSIS — N183 Chronic kidney disease, stage 3 unspecified: Secondary | ICD-10-CM

## 2020-12-26 HISTORY — PX: THROMBECTOMY BRACHIAL ARTERY: SHX6649

## 2020-12-26 LAB — CBC WITH DIFFERENTIAL/PLATELET
Abs Immature Granulocytes: 0.03 10*3/uL (ref 0.00–0.07)
Basophils Absolute: 0 10*3/uL (ref 0.0–0.1)
Basophils Relative: 0 %
Eosinophils Absolute: 0.1 10*3/uL (ref 0.0–0.5)
Eosinophils Relative: 1 %
HCT: 29.3 % — ABNORMAL LOW (ref 39.0–52.0)
Hemoglobin: 10 g/dL — ABNORMAL LOW (ref 13.0–17.0)
Immature Granulocytes: 0 %
Lymphocytes Relative: 3 %
Lymphs Abs: 0.2 10*3/uL — ABNORMAL LOW (ref 0.7–4.0)
MCH: 33.4 pg (ref 26.0–34.0)
MCHC: 34.1 g/dL (ref 30.0–36.0)
MCV: 98 fL (ref 80.0–100.0)
Monocytes Absolute: 0.8 10*3/uL (ref 0.1–1.0)
Monocytes Relative: 10 %
Neutro Abs: 6.2 10*3/uL (ref 1.7–7.7)
Neutrophils Relative %: 86 %
Platelets: 142 10*3/uL — ABNORMAL LOW (ref 150–400)
RBC: 2.99 MIL/uL — ABNORMAL LOW (ref 4.22–5.81)
RDW: 13.7 % (ref 11.5–15.5)
WBC: 7.3 10*3/uL (ref 4.0–10.5)
nRBC: 0 % (ref 0.0–0.2)

## 2020-12-26 LAB — ECHOCARDIOGRAM COMPLETE BUBBLE STUDY
AR max vel: 1.87 cm2
AV Area VTI: 2.06 cm2
AV Area mean vel: 1.87 cm2
AV Mean grad: 16 mmHg
AV Peak grad: 27.9 mmHg
Ao pk vel: 2.64 m/s
Area-P 1/2: 2.91 cm2
S' Lateral: 4.1 cm

## 2020-12-26 LAB — BASIC METABOLIC PANEL
Anion gap: 9 (ref 5–15)
BUN: 18 mg/dL (ref 8–23)
CO2: 20 mmol/L — ABNORMAL LOW (ref 22–32)
Calcium: 7.8 mg/dL — ABNORMAL LOW (ref 8.9–10.3)
Chloride: 109 mmol/L (ref 98–111)
Creatinine, Ser: 1.41 mg/dL — ABNORMAL HIGH (ref 0.61–1.24)
GFR, Estimated: 48 mL/min — ABNORMAL LOW (ref >60)
Glucose, Bld: 117 mg/dL — ABNORMAL HIGH (ref 70–99)
Potassium: 3.8 mmol/L (ref 3.5–5.1)
Sodium: 138 mmol/L (ref 135–145)

## 2020-12-26 LAB — HEPARIN LEVEL (UNFRACTIONATED)
Heparin Unfractionated: <0.1 IU/mL — ABNORMAL LOW (ref 0.30–0.70)
Heparin Unfractionated: 0.62 IU/mL (ref 0.30–0.70)

## 2020-12-26 LAB — POCT ACTIVATED CLOTTING TIME
Activated Clotting Time: 184 seconds
Activated Clotting Time: 243 seconds

## 2020-12-26 LAB — VALPROIC ACID LEVEL: Valproic Acid Lvl: 38 ug/mL — ABNORMAL LOW (ref 50.0–100.0)

## 2020-12-26 LAB — TRIGLYCERIDES: Triglycerides: 200 mg/dL — ABNORMAL HIGH (ref <150)

## 2020-12-26 SURGERY — THROMBECTOMY, ARTERY, BRACHIAL
Anesthesia: General | Site: Arm Upper | Laterality: Right

## 2020-12-26 MED ORDER — SODIUM CHLORIDE 0.9 % IV SOLN
INTRAVENOUS | Status: DC | PRN
  Administered 2020-12-26: 500 mL

## 2020-12-26 MED ORDER — DEXMEDETOMIDINE HCL IN NACL 400 MCG/100ML IV SOLN
0.4000 ug/kg/h | INTRAVENOUS | Status: DC
  Administered 2020-12-26: 0.4 ug/kg/h via INTRAVENOUS
  Filled 2020-12-26: qty 100

## 2020-12-26 MED ORDER — POTASSIUM CHLORIDE 10 MEQ/100ML IV SOLN
10.0000 meq | INTRAVENOUS | Status: AC
  Administered 2020-12-26 (×2): 10 meq via INTRAVENOUS
  Filled 2020-12-26 (×2): qty 100

## 2020-12-26 MED ORDER — SODIUM CHLORIDE 0.9 % IV SOLN: INTRAVENOUS | Status: DC | PRN

## 2020-12-26 MED ORDER — CHLORHEXIDINE GLUCONATE 0.12 % MT SOLN
15.0000 mL | Freq: Two times a day (BID) | OROMUCOSAL | Status: DC
  Administered 2020-12-26 – 2020-12-30 (×8): 15 mL via OROMUCOSAL
  Filled 2020-12-26 (×3): qty 15

## 2020-12-26 MED ORDER — PANTOPRAZOLE SODIUM 40 MG IV SOLR
40.0000 mg | INTRAVENOUS | Status: DC
  Administered 2020-12-26 – 2020-12-27 (×2): 40 mg via INTRAVENOUS
  Filled 2020-12-26 (×2): qty 40

## 2020-12-26 MED ORDER — HEPARIN (PORCINE) 25000 UT/250ML-% IV SOLN
1200.0000 [IU]/h | INTRAVENOUS | Status: DC
  Administered 2020-12-26: 1250 [IU]/h via INTRAVENOUS
  Administered 2020-12-27: 1200 [IU]/h via INTRAVENOUS
  Filled 2020-12-26 (×2): qty 250

## 2020-12-26 MED ORDER — 0.9 % SODIUM CHLORIDE (POUR BTL) OPTIME
TOPICAL | Status: DC | PRN
  Administered 2020-12-26: 1000 mL

## 2020-12-26 MED ORDER — VALPROATE SODIUM 100 MG/ML IV SOLN
500.0000 mg | Freq: Three times a day (TID) | INTRAVENOUS | Status: DC
  Administered 2020-12-27: 500 mg via INTRAVENOUS
  Filled 2020-12-26 (×3): qty 5

## 2020-12-26 MED ORDER — CEFAZOLIN SODIUM-DEXTROSE 2-3 GM-%(50ML) IV SOLR
INTRAVENOUS | Status: DC | PRN
  Administered 2020-12-26: 2 g via INTRAVENOUS

## 2020-12-26 MED ORDER — SODIUM CHLORIDE 0.9 % IV SOLN
INTRAVENOUS | Status: AC
  Filled 2020-12-26: qty 1.2

## 2020-12-26 MED ORDER — ROCURONIUM BROMIDE 10 MG/ML (PF) SYRINGE
PREFILLED_SYRINGE | INTRAVENOUS | Status: DC | PRN
  Administered 2020-12-26: 100 mg via INTRAVENOUS

## 2020-12-26 MED ORDER — HEPARIN SODIUM (PORCINE) 1000 UNIT/ML IJ SOLN
INTRAMUSCULAR | Status: DC | PRN
  Administered 2020-12-26: 4000 [IU] via INTRAVENOUS

## 2020-12-26 MED ORDER — METOPROLOL TARTRATE 5 MG/5ML IV SOLN
2.5000 mg | INTRAVENOUS | Status: DC | PRN
  Administered 2020-12-26: 5 mg via INTRAVENOUS
  Administered 2020-12-26 – 2020-12-27 (×3): 2.5 mg via INTRAVENOUS
  Filled 2020-12-26 (×4): qty 5

## 2020-12-26 MED ORDER — ORAL CARE MOUTH RINSE
15.0000 mL | Freq: Two times a day (BID) | OROMUCOSAL | Status: DC
  Administered 2020-12-26 – 2020-12-29 (×7): 15 mL via OROMUCOSAL

## 2020-12-26 MED ORDER — DILTIAZEM HCL-DEXTROSE 125-5 MG/125ML-% IV SOLN (PREMIX)
5.0000 mg/h | INTRAVENOUS | Status: DC
  Administered 2020-12-26: 5 mg/h via INTRAVENOUS
  Filled 2020-12-26: qty 125

## 2020-12-26 MED ORDER — POTASSIUM CHLORIDE CRYS ER 20 MEQ PO TBCR
40.0000 meq | EXTENDED_RELEASE_TABLET | Freq: Once | ORAL | Status: DC
  Filled 2020-12-26: qty 2

## 2020-12-26 MED ORDER — POTASSIUM CHLORIDE 20 MEQ PO PACK: 40.0000 meq | PACK | Freq: Once | ORAL | Status: DC

## 2020-12-26 MED ORDER — VALPROATE SODIUM 100 MG/ML IV SOLN
1500.0000 mg | Freq: Once | INTRAVENOUS | Status: AC
  Administered 2020-12-26: 1500 mg via INTRAVENOUS
  Filled 2020-12-26: qty 15

## 2020-12-26 MED ORDER — METOPROLOL TARTRATE 5 MG/5ML IV SOLN
INTRAVENOUS | Status: DC | PRN
  Administered 2020-12-26: 2.5 mg via INTRAVENOUS

## 2020-12-26 SURGICAL SUPPLY — 37 items
ADH SKN CLS APL DERMABOND .7 (GAUZE/BANDAGES/DRESSINGS) ×1
ARMBAND PINK RESTRICT EXTREMIT (MISCELLANEOUS) ×2 IMPLANT
CANISTER SUCT 1200ML W/VALVE (MISCELLANEOUS) ×2 IMPLANT
CATH EMB 3FR 40CM (CATHETERS) ×2 IMPLANT
CATH EMB 3FR 80CM (CATHETERS) IMPLANT
CATH EMB 4FR 80CM (CATHETERS) IMPLANT
CATH EMB 5FR 80CM (CATHETERS) IMPLANT
CATH EMB LATEX FREE 2FRX60CM (CATHETERS) ×2
CATH EMB LF 2FRX60 (CATHETERS) ×1 IMPLANT
CLIP VESOCCLUDE MED 6/CT (CLIP) ×2 IMPLANT
CLIP VESOCCLUDE SM WIDE 6/CT (CLIP) ×4 IMPLANT
CNTNR URN SCR LID CUP LEK RST (MISCELLANEOUS) ×1 IMPLANT
CONT SPEC 4OZ STRL OR WHT (MISCELLANEOUS) ×2
COVER PROBE W GEL 5X96 (DRAPES) ×2 IMPLANT
COVER WAND RF STERILE (DRAPES) ×2 IMPLANT
DERMABOND ADVANCED (GAUZE/BANDAGES/DRESSINGS) ×1
DERMABOND ADVANCED .7 DNX12 (GAUZE/BANDAGES/DRESSINGS) ×1 IMPLANT
ELECT REM PT RETURN 9FT ADLT (ELECTROSURGICAL) ×2
ELECTRODE REM PT RTRN 9FT ADLT (ELECTROSURGICAL) ×1 IMPLANT
GLOVE SURG ENC MOIS LTX SZ7.5 (GLOVE) ×2 IMPLANT
GOWN STRL REUS W/ TWL LRG LVL3 (GOWN DISPOSABLE) ×2 IMPLANT
GOWN STRL REUS W/ TWL XL LVL3 (GOWN DISPOSABLE) ×1 IMPLANT
GOWN STRL REUS W/TWL LRG LVL3 (GOWN DISPOSABLE) ×4
GOWN STRL REUS W/TWL XL LVL3 (GOWN DISPOSABLE) ×2
KIT BASIN OR (CUSTOM PROCEDURE TRAY) ×2 IMPLANT
KIT TURNOVER KIT B (KITS) ×2 IMPLANT
LOOP VESSEL MINI RED (MISCELLANEOUS) ×2 IMPLANT
NS IRRIG 1000ML POUR BTL (IV SOLUTION) ×2 IMPLANT
PACK CV ACCESS (CUSTOM PROCEDURE TRAY) ×2 IMPLANT
PAD ARMBOARD 7.5X6 YLW CONV (MISCELLANEOUS) ×4 IMPLANT
SUT MNCRL AB 4-0 PS2 18 (SUTURE) ×2 IMPLANT
SUT PROLENE 6 0 BV (SUTURE) ×10 IMPLANT
SUT VIC AB 3-0 SH 27 (SUTURE) ×2
SUT VIC AB 3-0 SH 27X BRD (SUTURE) ×1 IMPLANT
TOWEL GREEN STERILE (TOWEL DISPOSABLE) ×2 IMPLANT
UNDERPAD 30X36 HEAVY ABSORB (UNDERPADS AND DIAPERS) ×2 IMPLANT
WATER STERILE IRR 1000ML POUR (IV SOLUTION) ×2 IMPLANT

## 2020-12-26 NOTE — Progress Notes (Signed)
Patient arrived back to 4N32 from OR and placed back on full vent support.

## 2020-12-26 NOTE — Progress Notes (Signed)
Right arm cooler than left starting at shoulder, fingers on right hand are purple with >3 second cap refill. E-link notified, awaiting orders.

## 2020-12-26 NOTE — Transfer of Care (Signed)
Immediate Anesthesia Transfer of Care Note  Patient: Cesar [name]  Procedure(s) Performed: THROMBECTOMY RIGHT UPPER EXTREMITY (Right Arm Upper)  Patient Location: ICU  Anesthesia Type:General  Level of Consciousness: Patient remains intubated per anesthesia plan  Airway & Oxygen Therapy: Patient remains intubated per anesthesia plan and Patient placed on Ventilator (see vital sign flow sheet for setting)  Post-op Assessment: Report given to RN and Post -op Vital signs reviewed and stable  Post vital signs: Reviewed and stable  Last Vitals:  Vitals Value Taken Time  BP    Temp    Pulse 115 12/21/2020 1445  Resp 20 12/17/2020 1445  SpO2 100 % 12/07/2020 1445  Vitals shown include unvalidated device data.  Last Pain:  Vitals:   01/02/2021 1149  TempSrc: Axillary  PainSc:          Complications: No complications documented.

## 2020-12-26 NOTE — Progress Notes (Addendum)
STROKE TEAM PROGRESS NOTE    HPI: Patient has previous TIA and incidental left cerebellum infarct in 03/2019.  At that time, CT old left MCA infarct, MRA right siphon mild to moderate stenosis, carotid Doppler negative, EF 60 to 65%, LDL 94 and A1c 5.3.  Patient continued on Coumadin and Lipitor 80.  Patient admitted 11/2020 for left large SDH status post burr hole evacuation.  Anticoagulation was on hold.  Patient developed worsening right-sided weakness and aphasia, EEG negative, concerning for seizure activity put on Depakote 1000 mg nightly.  This admission patient admitted for confusion, fever, urosepsis and bilateral DVT.  However patient developed acute worsening of speech and left-sided weakness in the ED.  Code stroke activated, CT showed left SDH much improved, stable bilateral frontal encephalomalacia and BG lacunar infarcts.  MRI showed small multiple punctate scattered infarcts at right MCA, left CR, left MCA/PCA.  However, found to have an no flow of right ICA.  CT head and neck done showed occlusion of right ICA from bulb to terminal and proximal MCA.  CT perfusion showed large penumbra.  Underwent thrombectomy with TICI3 reperfusion.  CT repeat no bleeding.   INTERVAL HISTORY Yesterday evening: S/p repeated  stroke work up yesterday late afternoon after patient was found up in chair slumped over and not responsive. Noted to have right gaze and left hemiplegia. CT head no acute change. The pt at that time was able to open eyes on voice stimulation but still not verbal or following commands, still had left hemiplegia.  CTA head and neck showed right M1 occlusion and short segment of right P2 high grade stenosis vs. Occlusion. CTP showed large penumbra. He was intubated and taken emergently to IR by Dr. Estanislado Pandy for agram/thrombectomy.  Returned to ICU intubated and sedated. Bilat LE DVTs noted 3/23.   Today: Right axillary artery occlussion with partial thrombus noted early am today.  Vascular surgery called and planning to take to OR today for emergent thrombectomy.  Patient remaines intubated and sedated on precedex. Remains weak in the left hemibody and withdraws right hemibody. RN reports patient moving all extremities except LUE when sedation was lighter earlier this morning.  Wife and daughter at bedside. Plan of care discussed. Questions answered.   Vitals:   12/28/2020 0745 12/24/2020 0800 01/01/2021 0815 12/03/2020 0830  BP: 129/70 123/63 128/67 112/72  Pulse: (!) 130 (!) 128 (!) 129 96  Resp: (!) 24 (!) 23 (!) 25 18  Temp:      TempSrc:      SpO2: 100% 100% 100% 100%  Weight:      Height:       CBC:  Recent Labs  Lab 12/24/20 0514 12/07/2020 0523 12/19/2020 0522  WBC 9.2 7.0 7.3  NEUTROABS 7.4  --  6.2  HGB 9.9* 9.7* 10.0*  HCT 30.0* 29.8* 29.3*  MCV 98.4 98.7 98.0  PLT 126* 129* 341*   Basic Metabolic Panel:  Recent Labs  Lab 12/29/2020 0523 01/02/2021 0522  NA 136 138  K 3.9 3.8  CL 109 109  CO2 23 20*  GLUCOSE 93 117*  BUN 21 18  CREATININE 1.18 1.41*  CALCIUM 7.8* 7.8*  MG 1.9  --    Lipid Panel:  Recent Labs  Lab 12/27/2020 1625 12/24/20 0514 12/29/2020 0522  CHOL 115  --   --   TRIG 68   < > 200*  HDL 41  --   --   CHOLHDL 2.8  --   --  VLDL 14  --   --   LDLCALC 60  --   --    < > = values in this interval not displayed.   HgbA1c:  Recent Labs  Lab 12/19/2020 1625  HGBA1C 5.4    IMAGING past 24 hours  CT HEAD WO CONTRAST  Result Date: 12/24/2020 IMPRESSION:  1. Stable non contrast CT appearance of the brain since yesterday. Multifocal chronic ischemia with no acute or evolving infarct by CT.  2. No acute intracranial hemorrhage. Stable mixed density left subdural hematoma.    MR BRAIN WO CONTRAST  Result Date: 12/21/2020 IMPRESSION: Multiple small areas of acute infarct in the right cerebral hemisphere. Interval resolution of small infarct in the left hippocampus. Loss of flow void right internal carotid artery compatible with  interval occlusion Subacute subdural hematoma left 7 mm, unchanged.   CTA head/neck   Result Date: 12/29/2020 IMPRESSION:  CTA neck:  1. The cervical right ICA becomes occluded or near occluded at its proximal to mid aspect. The vessel remains occluded or near occluded throughout the remainder of the neck.  2. Left CCA and ICA patent within the neck with less than 50% stenosis.  3. Vertebral arteries patent within the neck bilaterally.  4. Moderate/severe stenosis at the origin of the dominant left vertebral artery.   CTA head:  1. The right ICA remains occluded or near occluded intracranially throughout the siphon region. There is reconstitution of enhancement within this vessel at the level of the supraclinoid segment.  2. High-grade focal stenosis within an inferior division proximal M2 right MCA branch vessel.  3. Atherosclerotic irregularity of additional proximal to mid right M2 MCA branch vessels. Most notably, there is segmental high-grade stenosis within a superior division mid M2 right MCA branch.  4. 2 mm medially projecting vascular protrusion arising from the supraclinoid right ICA. This may reflect an aneurysm, infundibulum or the origin of an otherwise occluded right posterior communicating artery.   CEREBRAL PERFUSION W CONTRAST CT perfusion head: The perfusion software identifies no core infarct. The small acute infarcts demonstrated on the brain MRI performed earlier today are not detected. 109 mL region of hypoperfusion, predominantly within the right cerebral hemisphere.  DG CHEST PORT 1 VIEW Result Date: 12/10/2020 IMPRESSION: Endotracheal tube 7 cm above the carina. No acute cardiopulmonary disease.    PHYSICAL EXAM GENERAL: Aroused to tactile and verbal stimuli HEENT: - Normocephalic and atraumatic, dry mm,  LUNGS - Normal respiratory effort. SaO2 CV - A fib on tele ABDOMEN - Soft, nontender Ext: warm, well perfused  NEURO:  Mental Status: Patient becomes  alert with verbal stimuli, but requires some tactile stimulation intermittently to keep awake. Speech/Language: Intubated and sedated.  Cranial Nerves:  II: PERR 2 mm/brisk. Unable to assess visual fields III, IV, VI: Does not follow or track across midline to the left. Gazes to the right. V: Does not blink to threat of the left side VII:  left facial droop. VIII: Hearing intact to voice IX, X: unable to assess due to intubated and sedated XI: No use of the left shoulder or arm. Falls onto his left side. XII: Head turned to the right.  Motor: At least 3/5 in RLE and good grip in the R hand. 2/5 in LUE/ LLE to noxious stimuli Coordination:Unable to assess due to AMS Gait- deferred  ASSESSMENT/PLAN Mr. Cesar [name] is a 87 y.o. male with history of A. fib, CVA, seizure disorder, history of DVT, hyperlipidemia, chronic kidney disease stage III,  severe AS status post TAVR in January 2022, OSA not on CPAP, nonobstructive CAD presenting with confusion. Was found to have multiple small areas of acute infarct in the right cerebral hemisphere and right ICA and right MCA occlusions.  He underwent emergent mechanical thrombectomy of right ICA terminus and right MCA occlusions achieving a TICI 3 revascularization     Stroke:  right MCA and left CR, left MCA/PCA punctate scattered infarcts with right ICA and proximal MCA occlusions s/p IR with TICI 3 revasculariztion. These events occurred in the setting of known atrial fibrillation off anticoagulation due to recent burr hole surgery for SDH.    Code Stroke CT head no acute intracranial hemorrhage  CTA head right ICA remains occluded, High-grade stenosis of M2 right MCA branch vessel.   CTA neck cervical right ICA becomes occluded or near occluded at its proximal to mid aspect. The vessel remains occluded or near occluded throughout the remainder of the neck. Left CCA and ICA patent within the neck with less than 50% stenosis.   CT perfusion no core  infarct but large penumbra.  CT head repeat no bleeding  MRI  Multiple small areas of acute infarct in the right cerebral hemisphere. As well as left CR and left MCA/PCA punctate infarcts  MRA Degraded by motion, Right ICA is now patent. Suspect moderate narrowing at the Clinoid  2D Echo: EF 60-65%, Left atrial size was severely dilated. Mild LVH. Left ventricular diastolic  function could not be evaluated due to atrial fibrillation.  LDL 60  HgbA1c 5.4  NPO per bedside RN swallow test. Given dsypha  VTE prophylaxis - heparin IV infusion  Diet: heart healthy  aspirin 81 mg daily prior to admission, now on heparin IV.   Therapy recommendations:  pending  Disposition:  Pending  Seizure history  11/2020 due to SDH, discharged with depakote 500 bid  This admission Depakote level 44->41->65  Loaded with Depakote 1500mg  x1  Continue Depakote 500mg  bid  EEG: mild-moderate diffuse encephalopathy, with possible/not definitive increased areas of focal neuronal dysfunction in the right (artifact is more favored) and possibly in the anterior regions (bilaterally).  There are no clear epileptiform abnormalities.  Right ICA and MCA occlusions  CTA Head/Neck showed R ICA occlusion proximally with moderate/severe stenosis at the origin of the dominant L vertebral artery.  S/p IR thrombectomywithTICI3 revascularization of occluded ICA to the terminus and RT MCA x 1 pass  SBP goal 120-140 at 24h post procedure  Bilateral lower extremity DVTs  History of blood clots, prior DVT 2006  This admission LE venous doppler - R posterior tibial and peroneal veins and L peroneal veins DVT.  Heparin infusion per stroke protocol - No bolus per stroke protocol. Goal level Heparin level 0.3-0.5 units/ml  Per NSG, repeat head CT once heparin therapeutic level to evaluate for SDH  Ischemic right upper extremity with suspected cardiac embolus  Right axillary artery occluded with partial  thrombus: To OR today with Dr. Carlis Abbott, vascular surgery, for LUE thrombectomy  Continue heparin gtt (peroperative management per vascular surgery recommendations)  Atrial fibrillation  Cardiology consult is   Previously on coumadin -> eliquis -> Xarelto, held due to SDH s/p craniotomy 11/2020  Heparin IV infusion  Heparin level 0.13->0.31  Monitor for HIT  Hgb 10.7->9.9->9.7  Echo pending  Rate controlled  SDH  11/2020 left SDH status post evacuation  Anticoagulation was on hold, plan to resume in 6 weeks  This admission CT head left SDH much improved  Discussed with NSG Dr. Venetia Constable, okay to start heparin IV with close monitoring  History of stroke  03/2019 admitted for left-sided weakness, left hemianopia, MRI showed only incidental small left cerebellar infarct.  Diagnosed with right brain TIA.  CT showed no acute finding, but old left MCA infarct.  MCA showed right siphon mild to moderate stenosis.  Carotid Doppler negative.  EF 60 to 65%.  LDL 94, A1c 5.3.  Continued on Coumadin and Lipitor 80   Dysphagia  SLP eval 3/23: Dysphagia 1 puree thin liquid diet: Passed swallow prior to decline, but now will require re-evaluation by SLP  Urosepsis  Febrile on admission 101.9 -> afebrile  WBC 11.7 -> 9.2->7  UA: +nitrite, +LE, WBC >50  Urine culture: ecoli  Recent ecoli uti (12/06/20)  Received vancomycin IV x1, continue cefepime   Continue foley catheter in perioperative setting   Hypertension  Home meds:  Amlodipine 5mg  daily  BP soft  Off Cleveprex . SBP 120-140 at 24h post procedure .  Hyperlipidemia  Home meds:  Atorvastatin 80mg  daily, resumed in hospital  LDL 60, goal < 70  Continue statin at discharge  CKD Stage III  Baseline sCr 1.3-1.5.  Cre now 1.18->1.16->1.18  Other Stroke Risk Factors  Advanced Age >/= 69   Overweight, Body mass index is 29.63 kg/m., recommend weight loss, diet and exercise as appropriate   Hx  stroke  Family hx stroke (father stroke)  Coronary artery disease  Abdominal aortic aneurysm  TAVR 10/2020  Other Active Problems  GERD: protonix  BPH: proscar  Hospital day # 3    This plan of care was directed by Dr. Erlinda Hong.  Hetty Blend, NP-C    ATTENDING NOTE: I reviewed above note and agree with the assessment and plan. Pt was seen and examined.   Patient daughter at bedside.  Patient had right MCA thrombectomy last night with TICI3 reperfusion.  Repeat MRI showed scattered patchy right MCA infarcts and MRA patient right MCA and right PCA.  Currently patient still intubated off sedation, eyes closed, barely open on voice, not following commands. With forced eye opening, eyes in mid position, not blinking to visual threat, doll's eyes present, not tracking, PERRL. Corneal reflex present, gag and cough present. Breathing over the vent.  Facial symmetry not able to test due to ET tube.  Tongue protrusion not cooperative. On pain stimulation, slight withdrawal on the right upper and lower extremities, no movement on the right upper and lower extremities. No babinski. Sensation, coordination and gait not tested.  However, patient was found to have low pulse on the right upper extremity, consulted vascular surgery Dr. Carlis Abbott, had emergent right UE thrombectomy including the radial, ulnar, brachial and axillary arteries.  Patient continued on heparin IV.  Patient current development of recurrent right MCA occlusion and right upper extremity vascular occlusion consistent with cardioembolic source, likely due to A. fib not on anticoagulation.  Will continue heparin IV.  Platelet level 142, stable.  Wean off went as able.    Depakote level 38 today, low.  Will again load with Depakote 1500 mg and followed by with Depakote 500 every 8 hours.  Check Keppra level daily.  Rosalin Hawking, MD PhD Stroke Neurology 12/18/2020 7:58 PM  This patient is critically ill due to recurrent  embolic stroke, status post thrombectomy x2, right upper extremity vascular occlusion, status post thrombectomy, seizure, subdural hematoma, persistent A. fib and at significant risk of neurological worsening, death form recurrent stroke, hemorrhagic conversion, status  epilepticus, heart failure, ischemic limb. This patient's care requires constant monitoring of vital signs, hemodynamics, respiratory and cardiac monitoring, review of multiple databases, neurological assessment, discussion with family, other specialists and medical decision making of high complexity. I spent 50 minutes of neurocritical care time in the care of this patient. I had long discussion with daughter at bedside, updated pt current condition, treatment plan and potential prognosis, and answered all the questions.  She expressed understanding and appreciation.  I also discussed with Dr. Tamala Julian CCM.    To contact Stroke Continuity provider, please refer to [email or web address] After hours, contact General Neurology

## 2020-12-26 NOTE — Progress Notes (Signed)
   PCCM Interval Note  Patient in the process of weaning on PSV.  Noted to have low return volumes.  Pulling over 1L TV.  ETT cuff found to be ruptured.  Was on low dose precedex which was turned off and trial of extubation.   Patient on my exam is doing well after extubation on 2L.  Has a cough, able to follow some commands (hearing aids are in), daughter remains at bedside.  He will open his eyes and wiggles both his toes.  R arm is now warm with strong radial pulse.    He his sustained in afib 120, remains on cleviprex 6mg .  PRN lopressor ordered.    Will change per tube K replete to IV for now.  Anticipate SLP eval tomorrow once more interactive.    Will continue to closely monitor for need of re-intubation.       Kennieth Rad, ACNP [facility] 12/29/2020, 4:22 PM

## 2020-12-26 NOTE — Anesthesia Procedure Notes (Signed)
Central Venous Catheter Insertion Performed by: Nolon Nations, MD, anesthesiologist Start/End03/08/2021 1:05 PM, 12/07/2020 1:25 PM Patient location: Pre-op. Preanesthetic checklist: patient identified, IV checked, site marked, risks and benefits discussed, surgical consent, monitors and equipment checked, pre-op evaluation, timeout performed and anesthesia consent Position: Trendelenburg Lidocaine 1% used for infiltration and patient sedated Hand hygiene performed  and maximum sterile barriers used  Catheter size: 8 Fr Total catheter length 16. Central line was placed.Double lumen Procedure performed using ultrasound guided technique. Ultrasound Notes:image(s) printed for medical record Attempts: 1 Following insertion, dressing applied, line sutured and Biopatch. Post procedure assessment: blood return through all ports, free fluid flow and no air  Patient tolerated the procedure well with no immediate complications.

## 2020-12-26 NOTE — Progress Notes (Signed)
Right upper extremity arterial duplex  has been completed. Refer to [facility] under chart review to view preliminary results. Results given to Amy, patient's nurse.  12/15/2020  10:21 AM Oland Arquette, Bonnye Fava

## 2020-12-26 NOTE — Progress Notes (Addendum)
NAME:  Cesar [name], MRN:  [id number], DOB:  01-29-1929, LOS: 3 ADMISSION DATE:  12/14/2020, CONSULTATION DATE:  .12/31/2020 REFERRING MD:  Cheral Marker, CHIEF COMPLAINT:  Weakness   History of Present Illness:  87 y.o. M with PMH significant for AAA, stage III CKD, OSA, seizures, OSA, severe AS s/p TAVR 10/22/20, permanent Afib who has recently admitted for TAVR and discharged on [place] and Eliquis, re-admitted 2/15 with large, chronic subdural hematoma after falls and underwent bur hole drainage with drain placement.  His anti-coagulation was held until 2/21  and he was discharged to rehab 2/28 ( [facility])  and then to home on 3/17.      He then presented to the ED on 3/21 with generalized weakness and fever to 101.9 and was found to have UTI and bilateral DVT's MRI showed possible internal carotid artery occlusion on the right and right MCA CVA.   His initial head CT did not show acute changes, but throughout ED course he became increasingly confused and had slurred speech. Pt. Also has a seizure disorder , EEG did not show seizure activity Depakote was subtherapeutic, and he was loaded.  CTA showed occluded R ICA, so was taken emergently to IR, Underwent thrombectomy of ICA and right MCA and was admitted and transported to ICU intubated , and stable  Pertinent  Medical History   has a past medical history of AAA (abdominal aortic aneurysm) ([place]), CKD (chronic kidney disease), stage III ([place]), CVA (cerebral infarction), Depression, DVT (deep venous thrombosis) ([place]) (2006), H/O blood clots, Hematuria, HOH (hard of hearing), Hypercholesterolemia, OSA (obstructive sleep apnea), Permanent atrial fibrillation ([place]), Prostate enlargement, Pulmonary nodules, 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   Significant Hospital Events: Including procedures, antibiotic start and stop dates in addition to other pertinent  events   . Admit to hospitalists, worsening neuro status and found to have occluded R ICA, taken to IR and then ICU, PCCM consult . 3/22 extubated. Cefepime for e coli uti  . 3/23 remains stable following extubation, not on any vasoactive infusions. PCCM to sign off. Found unresponsive and slumped over.  CTA head/neck showed R M1 occlusion and R P2 high grade stenosis vs occlusion, CTP large penumbra taken back emergently to NIR w/complete revascularization of occluded R MCA; returns intubated  Interim History / Subjective:  Found unresponsive yesterday with CTA c/w with R MCA occlusion s/p NIR with complete revascularization; left intubated overnight  MRA this morning, showing multiple new acute ischemic infarcts in right cerebral hemisphere c/w with recent right M1 occlusion, stable subacute SDH, patent right MCA  Noted to have cool right upper extremity overnight, stat pending arterial doppler  Currently off sedation, on PSV trial Remains on cleviprex for SBP control  Objective   Blood pressure 136/70, pulse (!) 122, temperature 97.9 F (36.6 C), temperature source Oral, resp. rate (!) 25, height 5\' 9"  (1.753 m), weight 91 kg, SpO2 100 %.    Vent Mode: CPAP;PSV FiO2 (%):  [30 %-60 %] 40 % Set Rate:  [15 bmp] 15 bmp Vt Set:  [560 mL] 560 mL PEEP:  [5 cmH20] 5 cmH20 Pressure Support:  [5 cmH20] 5 cmH20 Plateau Pressure:  [11 cmH20-13 cmH20] 11 cmH20   Intake/Output Summary (Last 24 hours) at 12/21/2020 0739 Last data filed at 12/14/2020 0600 Gross per 24 hour  Intake 2611.6 ml  Output 2825 ml  Net -213.4 ml   Filed Weights   12/31/2020  7741 12/20/2020 2010  Weight: 86.2 kg 91 kg   General:  Elderly male currently intubated in NAD HEENT: MM pink/moist, pupils 2/reactive, anicteric, ETT  Neuro: exam limited- extremely HOH, does not open eyes to voice or f/c, spont movement of RLE, minimal withdrawal in RUE and LLE, flaccid in LUE CV: ST PULM:  Non labored, CTA, small amount of tan/  pink tinged secretions GI: soft, NT, hypoBS, foley  Extremities: warm/dry, no edema  Skin: no rashes  -213 ml/hr, net +540 ml afebrile  Labs/imaging that I havepersonally reviewed   3/21 SARS neg 3/21 MRSA neg 3/21>>Urine Cx Ecoli 90k  3/21>> Blood Cx, pending  3/20 vanc 3/20 cefepime >> 3/22 3/21 cefazolin; 3/23 3/23 BMP CBC -- grossly WNL -- hgb stable at 9.7  3/24 sCr 1.18-> 1.41, triglycerides 64-> 200, WBC normal, Hgb stable  Resolved Hospital Problem list   Respiratory failure requiring intubation 3/22  Assessment & Plan:   R ICU occlusion, s/p thrombectomy 3/21 and 3/23 Initially admitted 3/21 for AMS in the setting of recent SDH, increased slurred speech around 0500 3/21 prompting CT Head showing resolving SDH, stable mild mass effect without midline shift; MR Brain with multiple small areas of acute infarct in the R cerebral hemisphere with loss of flow in the R ICA compatible with occlusion; CTA Head/Neck showed R ICA occlusion proximally with moderate/severe stenosis at the origin of the dominant L vertebral artery. S/p IR thrombectomy with complete revascularization of occluded ICA to the terminus and RT MCA x 1 pass.  Found unresponsive again 3/23, [facility] CTA c/w with R MCA occlusion s/p NIR with complete revascularization Recent L SDH s/p burr hole (11/2020)  -stable on MRI 3/24 Seizure disorder  -vpa levels a little sub therapeutic  P - per NIR/ Neuro  - heparin gtt on hold till ok with Neuro - cleviprex for SBP goal < 160 - MRA as above - ongoing neuro exams  - Cont VPA -- dosing per neuro, trend levels   Acute respiratory insufficiency secondary to above -Continue MV support, 8cc/kg IBW with goal Pplat <30 and DP<15  -VAP prevention protocol/ PPI -PAD protocol for sedation> d/c propofol, change to prn fentanyl and precedex if needed -wean FiO2 as able for SpO2 >92%  -daily SAT & SBT, hopeful for extubation today   E.Coli UTI  Admitted with fever to Tmax  101.9, leukocytosis, lactic acidosis; UA with +nitrite + leuks, > 50 RBCs and rare bacteria. UCx pending, recent culture 12/06/20 +E.coli (pansensitive).  Urine Cx + for gram - rods, susceptibilities pending P - completed 3 days of cefepime  Severe aortic stenosis s/p TAVR 10/2020 Atrial Fibrillation, chronic  -previously on eliquis, had been held since mid Feb 2022 in setting of SDH  BLE DVT 12/29/2020 CHAD2S2VASc score 4 (4.8-6.7%). P - cardiac monitoring - holding Heparin gtt till ok with Neuro - consider IVC filter - ECHO w/bubble study pending  CKD III  Baseline sCr 1.3-1.5. P - sCr up today 1.18-> 1.41 - continue MIVF, NS 75 ml/hr - trend renal indices/ UOP - Avoid nephrotoxic agents as able  HLD -lipitor   BPH, acute urinary retention  -proscar (home med)  - trial of d/c foley today, bladder scan to monitor for rentention  GERD -Protonix   Physical deconditioning  Recently discharged from acute rehab 12/19/2020. - PT/OT/SLP as clinically appropriate  Suspected RUE arterial occlusion - pending stat arterial duplex - ongoing neurovascular checks Addendum: prelim duplex showing patent SVC but present axillary occlusion,  partially occlusive.  Spoke with vascular surgery, Dr. Carlis Abbott who will come see, appreciate assistance.  Neurology aware, discussed with Dr. Erlinda Hong, ok with full dose anticoagulation, likely to go to OR for thrombectomy.   Best practice (evaluated daily)  Diet:  NPO - Thin liquid. Whole meds w puree  Pain/Anxiety/Delirium protocol (if indicated): Yes (RASS goal 0) VAP protocol (if indicated): Yes DVT prophylaxis: Systemic AC -currently on hold GI prophylaxis: PPI Glucose control:  SSI Yes Central venous access:  N/A Arterial line:  Yes, and it is still needed while on cleviprex Foley:  Yes, and it is no longer needed -- will trial dc   Mobility:  bed rest  PT consulted: Yes Last date of multidisciplinary goals of care discussion [per primary] Code  Status:  full code Disposition: ICU  Labs   CBC: Recent Labs  Lab 12/08/2020 0218 12/16/2020 0732 12/04/2020 1037 12/11/2020 2038 12/27/2020 2115 12/24/20 0514 12/19/2020 0523 12/29/2020 0522  WBC 15.3* 15.1*  --   --  11.7* 9.2 7.0 7.3  NEUTROABS 13.1*  --   --   --  9.4* 7.4  --  6.2  HGB 11.7* 10.3*   < > 9.5* 10.7* 9.9* 9.7* 10.0*  HCT 36.7* 32.0*   < > 28.0* 32.9* 30.0* 29.8* 29.3*  MCV 101.9* 101.6*  --   --  98.5 98.4 98.7 98.0  PLT 162 147*  --   --  137* 126* 129* 142*   < > = values in this interval not displayed.    Basic Metabolic Panel: Recent Labs  Lab 12/10/2020 0745 01/01/2021 1037 12/27/2020 2038 01/01/2021 2115 12/24/20 0514 12/12/2020 0523 12/29/2020 0522  NA 139   < > 140 136 139 136 138  K 4.5   < > 4.0 3.9 4.1 3.9 3.8  CL 109  --   --  109 110 109 109  CO2 23  --   --  21* 21* 23 20*  GLUCOSE 112*  --   --  100* 105* 93 117*  BUN 23  --   --  21 22 21 18   CREATININE 1.44*  --   --  1.18 1.16 1.18 1.41*  CALCIUM 8.4*  --   --  8.0* 8.1* 7.8* 7.8*  MG  --   --   --   --   --  1.9  --    < > = values in this interval not displayed.   GFR: Estimated Creatinine Clearance: 39.6 mL/min (A) (by C-G formula based on SCr of 1.41 mg/dL (H)). Recent Labs  Lab 12/27/2020 0211 12/29/2020 0218 12/24/2020 0732 12/28/2020 2115 12/24/20 0514 12/29/2020 0523 12/05/2020 0522  WBC  --    < > 15.1* 11.7* 9.2 7.0 7.3  LATICACIDVEN 2.0*  --  1.9  --   --   --   --    < > = values in this interval not displayed.    Liver Function Tests: Recent Labs  Lab 12/05/2020 0218  AST 27  ALT 17  ALKPHOS 42  BILITOT 0.8  PROT 5.9*  ALBUMIN 2.6*   No results for input(s): LIPASE, AMYLASE in the last 168 hours. Recent Labs  Lab 12/14/2020 0732  AMMONIA 14    ABG    Component Value Date/Time   PHART 7.483 (H) 12/13/2020 2038   PCO2ART 34.6 12/16/2020 2038   PO2ART 538 (H) 01/01/2021 2038   HCO3 25.9 12/04/2020 2038   TCO2 27 12/13/2020 2038   ACIDBASEDEF 3.0 (H) 12/22/2020 1037  O2SAT  100.0 12/28/2020 2038     Coagulation Profile: No results for input(s): INR, PROTIME in the last 168 hours.  Cardiac Enzymes: No results for input(s): CKTOTAL, CKMB, CKMBINDEX, TROPONINI in the last 168 hours.  HbA1C: Hgb A1c MFr Bld  Date/Time Value Ref Range Status  12/21/2020 04:25 PM 5.4 4.8 - 5.6 % Final    Comment:    (NOTE) Pre diabetes:          5.7%-6.4%  Diabetes:              >6.4%  Glycemic control for   <7.0% adults with diabetes   10/22/2020 04:56 AM 5.1 4.8 - 5.6 % Final    Comment:    (NOTE) Pre diabetes:          5.7%-6.4%  Diabetes:              >6.4%  Glycemic control for   <7.0% adults with diabetes     CBG: Recent Labs  Lab 12/06/2020 0713  GLUCAP 107*    CRITICAL CARE Performed by: Kennieth Rad   Total critical care time: 35 mins   Kennieth Rad, ACNP [facility] 12/18/2020, 7:39 AM

## 2020-12-26 NOTE — Progress Notes (Signed)
Referring Physician(s): Code stroke- Kerney Elbe (neurology)  Supervising Physician: Luanne Bras  Patient Status:  [facility]  Chief Complaint: Stroke F/U  Subjective:  History of acute CVA s/p cerebral arteriogram with emergent mechanical thrombectomy of right ICA terminus and right MCA occlusions achieving a TICI 3 revascularization via right femoral approach 12/04/2020 by Dr. Estanislado Pandy. Noted overnight, cool, cyanotic right UE with delayed cap refill, concern for arterial embolus. Pt getting Echo and right UE arterial duplex this am Right femoral puncture site c/d/i.  MRA head 12/27/2020: 1. Interval development of multiple new acute ischemic infarcts involving the right cerebral hemisphere, primarily involving the right temporal lobe, consistent with recent right M1 occlusion. No associated hemorrhage or mass effect. 2. Continued interval evolution of additional recently identified small volume bilateral cerebral infarcts. 3. 6 mm subacute subdural hematoma overlying the left frontal convexity without significant mass effect, unchanged. Interval revascularization of previously identified right M1 occlusion, with wide patency of the right MCA now seen.  Allergies: Flomax [tamsulosin hcl] and Levetiracetam  Medications:  Current Facility-Administered Medications:  .  0.9 %  sodium chloride infusion, , Intravenous, Continuous, Deveshwar, Sanjeev, MD, Last Rate: 75 mL/hr at 01/01/2021 1000, Infusion Verify at 12/07/2020 1000 .  acetaminophen (TYLENOL) tablet 650 mg, 650 mg, Oral, Q4H PRN **OR** acetaminophen (TYLENOL) 160 MG/5ML solution 650 mg, 650 mg, Per Tube, Q4H PRN **OR** acetaminophen (TYLENOL) suppository 650 mg, 650 mg, Rectal, Q4H PRN, Deveshwar, Sanjeev, MD .  chlorhexidine gluconate (MEDLINE KIT) (PERIDEX) 0.12 % solution 15 mL, 15 mL, Mouth Rinse, BID, Rosalin Hawking, MD, 15 mL at 12/21/2020 0816 .  Chlorhexidine Gluconate Cloth 2 % PADS 6 each, 6 each, Topical,  Daily, Rosalin Hawking, MD, 6 each at 12/05/2020 2128 .  clevidipine (CLEVIPREX) infusion 0.5 mg/mL, 0-21 mg/hr, Intravenous, Continuous, Luanne Bras, MD, Paused at 12/11/2020 0855 .  dexmedetomidine (PRECEDEX) 400 MCG/100ML (4 mcg/mL) infusion, 0.4-1.2 mcg/kg/hr, Intravenous, Titrated, Simpson, Paula B, NP, Last Rate: 9.1 mL/hr at 12/24/2020 0814, 0.4 mcg/kg/hr at 12/17/2020 0814 .  docusate (COLACE) 50 MG/5ML liquid 100 mg, 100 mg, Per Tube, BID, Gleason, Otilio Carpen, PA-C .  fentaNYL (SUBLIMAZE) injection 25 mcg, 25 mcg, Intravenous, Q15 min PRN, Gleason, Otilio Carpen, PA-C .  fentaNYL (SUBLIMAZE) injection 25-100 mcg, 25-100 mcg, Intravenous, Q30 min PRN, Gleason, Otilio Carpen, PA-C, 50 mcg at 12/04/2020 0820 .  heparin ADULT infusion 100 units/mL (25000 units/214m), 1,250 Units/hr, Intravenous, Continuous, XRosalin Hawking MD, Last Rate: 12.5 mL/hr at 12/03/2020 1000, 1,250 Units/hr at 12/28/2020 1000 .  MEDLINE mouth rinse, 15 mL, Mouth Rinse, 10 times per day, XRosalin Hawking MD, 15 mL at 12/24/2020 1006 .  pantoprazole (PROTONIX) injection 40 mg, 40 mg, Intravenous, Q24H, Simpson, Paula B, NP, 40 mg at 12/20/2020 1006 .  polyethylene glycol (MIRALAX / GLYCOLAX) packet 17 g, 17 g, Per Tube, Daily, Gleason, LOtilio Carpen PA-C    Vital Signs: BP 108/67   Pulse 95   Temp 97.9 F (36.6 C) (Axillary)   Resp 18   Ht 5' 9"  (1.753 m)   Wt 91 kg   SpO2 100%   BMI 29.63 kg/m   Physical Exam Vitals and nursing note reviewed.  Constitutional:      Comments: Intubated and sedated again  Skin:    General: Skin is warm and dry.     Comments: Right femoral puncture site soft without active bleeding or hematoma.     Imaging: CT ANGIO HEAD W OR WO CONTRAST  Result Date: 12/07/2020 CLINICAL  DATA:  Stroke. EXAM: CT ANGIOGRAPHY HEAD AND NECK CT PERFUSION HEAD TECHNIQUE: Multidetector CT imaging of the head and neck was performed using the standard protocol during bolus administration of intravenous contrast. Multiplanar CT image  reconstructions and MIPs were obtained to evaluate the vascular anatomy. Carotid stenosis measurements (when applicable) are obtained utilizing NASCET criteria, using the distal internal carotid diameter as the denominator. Multiphase CT imaging of the brain was performed following IV bolus contrast injection. Subsequent parametric perfusion maps were calculated using RAPID software. CONTRAST:  171m OMNIPAQUE IOHEXOL 350 MG/ML SOLN COMPARISON:  Cerebral angiogram and intervention December 23, 2020. FINDINGS: CTA NECK FINDINGS Aortic arch: Standard branching. Imaged portion shows no evidence of aneurysm or dissection. Calcified plaques in the aortic arch and at the origin of the major neck arteries without hemodynamically significant stenosis. Right carotid system: Atherosclerotic changes in the right carotid bifurcation with mixed density plaque without hemodynamically significant stenosis. Left carotid system: Atherosclerotic changes of the left carotid bifurcation with mixed density plaque without hemodynamically significant stenosis. Increased tortuosity of the upper cervical segment the left ICA. Vertebral arteries: Dominant left vertebral artery is fat hypoplastic right vertebral artery. No evidence of dissection, stenosis (50% or greater) or occlusion. Skeleton: No acute or aggressive lesion identified. Other neck: Negative. Upper chest: Bilateral pleural effusion, right greater than left. Bilateral pleuroparenchymal scarring, centrilobular and paraseptal emphysema. Review of the MIP images confirms the above findings CTA HEAD FINDINGS Anterior circulation: Calcified plaques in the bilateral carotid siphons with mild stenosis of the paraclinoid segment on the right. There is a mid M1/MCA occlusion with poor distal opacification. Left MCA and bilateral ACA vascular tree are maintained. Posterior circulation: Calcified plaque in the V4 segment of the left vertebral artery without hemodynamically significant  stenosis. Short segment occlusion of the right distal P2/PCA with adequate distal opacification. The basilar artery and left posterior cerebral arteries are maintained. Venous sinuses: As permitted by contrast timing, patent. Review of the MIP images confirms the above findings CT Brain Perfusion Findings: CBF (<30%) Volume: 248mPerfusion (Tmax>6.0s) volume: 1477mismatch Volume: 123m34mfarction Location:Right MCA posterior division territory. IMPRESSION: 1. Right mid M1/MCA occlusion with poor distal opacification. 2. Short segment occlusion of the right distal P2/PCA with adequate distal opacification. 3. 24 mL right MCA posterior division territory core infarct and 123 mL right MCA posterior division territory penumbra. 4. Atherosclerotic changes of the bilateral carotid bifurcations without hemodynamically significant stenosis. 5. Bilateral pleural effusion, right greater than left. 6. Emphysema and biapical pleuroparenchymal scarring. 7. Emphysema and aortic atherosclerosis. These results were transmitted via secured paging system at the time of interpretation on 12/21/2020 at 4:29 pm to provider [facility] Atherosclerosis (ICD10-I70.0) and Emphysema (ICD10-J43.9). Electronically Signed   By: KatyPedro Earls.   On: 12/03/2020 16:46   CT ANGIO HEAD W OR WO CONTRAST  Result Date: 12/29/2020 CLINICAL DATA:  Neuro deficit, acute, stroke suspected. EXAM: CT ANGIOGRAPHY HEAD AND NECK CT PERFUSION BRAIN TECHNIQUE: Multidetector CT imaging of the head and neck was performed using the standard protocol during bolus administration of intravenous contrast. Multiplanar CT image reconstructions and MIPs were obtained to evaluate the vascular anatomy. Carotid stenosis measurements (when applicable) are obtained utilizing NASCET criteria, using the distal internal carotid diameter as the denominator. Multiphase CT imaging of the brain was performed following IV bolus contrast injection.  Subsequent parametric perfusion maps were calculated using RAPID software. CONTRAST:  100mL64mIPAQUE IOHEXOL 350 MG/ML SOLN COMPARISON:  Noncontrast head CT performed  earlier today 12/24/2020. Brain MRI performed earlier today 12/15/2020. MRA head 11/26/2020. FINDINGS: CTA NECK FINDINGS Aortic arch: Standard aortic branching atherosclerotic plaque within the visualized aortic arch and proximal major branch vessels of the neck. No hemodynamically significant innominate or proximal subclavian artery stenosis. Right carotid system: CCA patent within the neck to the bifurcation. Soft and calcified plaque within the carotid bifurcation and proximal ICA. There is occlusion or near occlusion of the cervical right ICA beginning at the proximal to mid aspect of this vessel. Left carotid system: CCA and ICA patent within the neck. Soft and calcified plaque within the carotid bifurcation and proximal ICA with less than 50% stenosis Vertebral arteries: The right vertebral artery is developmentally diminutive, but patent throughout the neck dominant left vertebral artery, patent within the neck. Moderate/severe stenosis at the origin of the left vertebral artery. Minimal calcified plaque is also present within the proximal V2 segment without stenosis at this site Skeleton: No acute bony abnormality or aggressive osseous lesion. Cervical spondylosis. Other neck: No neck mass or cervical lymphadenopathy. Thyroid unremarkable. Upper chest: No consolidation within the imaged lung apices. Biapical pleuroparenchymal scarring. Partially imaged right pleural effusion. Review of the MIP images confirms the above findings CTA HEAD FINDINGS Anterior circulation: The right ICA remains occluded or near occluded intracranially throughout the siphon region. There is reconstitution of enhancement within the vessel at the level of the supraclinoid segment. The right M1 middle cerebral arteries patent. High-grade focal stenosis within a proximal  M2 inferior division branch (series 9, image 25). Atherosclerotic irregularity of additional proximal to mid right M2 MCA branch vessels. Most notably, there is segmental high-grade stenosis within a superior division mid M2 right MCA branch (series 10, image 14). The intracranial left ICA is patent. Calcified plaque within moderate stenosis of the supraclinoid segment. The left M1 middle cerebral artery is patent. No left M2 proximal branch occlusion or high-grade proximal stenosis is identified. The anterior cerebral arteries are patent. Posterior circulation: The intracranial vertebral arteries are patent. Mild calcified plaque within the intracranial left vertebral artery without stenosis. The posterior cerebral arteries are patent. A left posterior communicating artery is present. 2 mm medially projecting vascular protrusion arising from the supraclinoid right ICA which may reflect an aneurysm, infundibulum or origin of an otherwise occluded right posterior communicating artery. Venous sinuses: Within the limitations of contrast timing, no convincing thrombus. Anatomic variants: As described Review of the MIP images confirms the above findings CT Brain Perfusion Findings: CBF (<30%) Volume: 63m Perfusion (Tmax>6.0s) volume: 1018mMismatch Volume: Infinite Infarction Location:None identified These results were called by telephone at the time of interpretation on 12/28/2020 at 5:32 pm to provider [facility] who verbally acknowledged these results. IMPRESSION: CTA neck: 1. The cervical right ICA becomes occluded or near occluded at its proximal to mid aspect. The vessel remains occluded or near occluded throughout the remainder of the neck. 2. Left CCA and ICA patent within the neck with less than 50% stenosis. 3. Vertebral arteries patent within the neck bilaterally. 4. Moderate/severe stenosis at the origin of the dominant left vertebral artery. CTA head: 1. The right ICA remains occluded or near occluded  intracranially throughout the siphon region. There is reconstitution of enhancement within this vessel at the level of the supraclinoid segment. 2. High-grade focal stenosis within an inferior division proximal M2 right MCA branch vessel. 3. Atherosclerotic irregularity of additional proximal to mid right M2 MCA branch vessels. Most notably, there is segmental high-grade stenosis within a superior  division mid M2 right MCA branch. 4. 2 mm medially projecting vascular protrusion arising from the supraclinoid right ICA. This may reflect an aneurysm, infundibulum or the origin of an otherwise occluded right posterior communicating artery. CT perfusion head: The perfusion software identifies no core infarct. The small acute infarcts demonstrated on the brain MRI performed earlier today are not detected. The perfusion software identifies a 109 mL region of hypoperfusion, predominantly within the right cerebral hemisphere. Electronically Signed   By: Kellie Simmering DO   On: 12/20/2020 17:48   CT HEAD WO CONTRAST  Result Date: 12/22/2020 CLINICAL DATA:  Stroke follow-up EXAM: CT HEAD WITHOUT CONTRAST TECHNIQUE: Contiguous axial images were obtained from the base of the skull through the vertex without intravenous contrast. COMPARISON:  CT head 12/24/2020 FINDINGS: Brain: Intermediate density subdural hematoma on the left is unchanged measuring up to 6 mm in thickness. No new area of hemorrhage. No midline shift Generalized atrophy. Chronic ischemic changes throughout the white matter. Chronic infarcts in the frontal lobes bilaterally unchanged. Negative for acute infarct or mass. Vascular: Negative for hyperdense vessel Skull: 2 burr holes on the left for subdural drainage. No acute skeletal abnormality. Sinuses/Orbits: Mild mucosal edema paranasal sinuses. Bilateral cataract extraction Other: None IMPRESSION: No acute abnormality no change from yesterday Intermediate density 6 mm left subdural hematoma unchanged Atrophy  and extensive chronic ischemic changes stable. Electronically Signed   By: Franchot Gallo M.D.   On: 12/22/2020 16:16   CT HEAD WO CONTRAST  Result Date: 12/24/2020 CLINICAL DATA:  87 year old male with right ICA occlusion/near occlusion. Tiny right hemisphere infarcts on MRI. Chronic/resolving left subdural hematoma. Status post neurovascular revascularization of the ICA and MCA yesterday. Subsequent encounter. EXAM: CT HEAD WITHOUT CONTRAST TECHNIQUE: Contiguous axial images were obtained from the base of the skull through the vertex without intravenous contrast. COMPARISON:  CT head 0730 hours yesterday. FINDINGS: Brain: Small residual mixed density left subdural hematoma remains stable. No new intracranial hemorrhage identified. Patchy bilateral MCA territory chronic encephalomalacia and extensive bilateral white matter hypodensity. Stable gray-white matter differentiation since yesterday. No acute or evolving infarct identified by CT. Vascular: Calcified atherosclerosis at the skull base. No suspicious intracranial vascular hyperdensity. Skull: Stable.  Previous left frontal burr holes. Sinuses/Orbits: Visualized paranasal sinuses and mastoids are stable and well pneumatized. Other: Scalp and orbits soft tissues are stable. IMPRESSION: 1. Stable non contrast CT appearance of the brain since yesterday. Multifocal chronic ischemia with no acute or evolving infarct by CT. 2. No acute intracranial hemorrhage. Stable mixed density left subdural hematoma. Electronically Signed   By: Genevie Ann M.D.   On: 12/24/2020 08:18   CT Head Wo Contrast  Result Date: 12/05/2020 CLINICAL DATA:  87 year old male with acute altered mental status, slurred speech and facial droop. Recent fall, subdural hematoma. EXAM: CT HEAD WITHOUT CONTRAST TECHNIQUE: Contiguous axial images were obtained from the base of the skull through the vertex without intravenous contrast. COMPARISON:  Head CT today at 0224 hours. Brain MRI 11/06/2020.  FINDINGS: Brain: Residual mixed density left subdural collection, likely with a degree of postoperative dural thickening, is stable and measures 7 mm or smaller at most levels. As before there is no significant midline shift, only mild mass effect on the left hemisphere. Stable ventricle size and configuration. Bilateral MCA territory encephalomalacia, Patchy and confluent bilateral cerebral white matter hypodensity remains stable. Streak artifacts suspected in the pons now. No new intracranial hemorrhage identified. No new cortically based infarct identified. Vascular: Calcified atherosclerosis at  the skull base.6 No suspicious intracranial vascular hyperdensity. 666 Skull: Stable left convexity burr holes. No acute osseous abnormality identified. Sinuses/Orbits: Visualized paranasal sinuses and mastoids are stable and well pneumatized. Other: No acute orbit or scalp soft tissue finding. IMPRESSION: 1. Stable since 0224 hours today.  No new intracranial abnormality. 2. Residual mixed density left subdural collection, 7-9 mm. Stable mild mass effect with no midline shift. 3. Chronic ischemic disease in both hemispheres. Electronically Signed   By: Genevie Ann M.D.   On: 12/08/2020 07:59   CT Head Wo Contrast  Result Date: 12/27/2020 CLINICAL DATA:  Fall from bed, recent subdural hematoma EXAM: CT HEAD WITHOUT CONTRAST TECHNIQUE: Contiguous axial images were obtained from the base of the skull through the vertex without intravenous contrast. COMPARISON:  CT 11/30/2020, MR 11/26/2020 FINDINGS: Brain: Redemonstration of the mixed age extra-axial blood products across the left frontal convexity which appear decreased in size from the prior exam now measuring up to a maximal thickness of 9 mm, previously 15 mm at a similar level. Resolution of the pneumocephaly seen on the comparison exam. No new sites of hemorrhage. Overlying craniotomy defects are noted with postsurgical change. Stable regions of encephalomalacia in  the bilateral frontal lobes. Additional punctate foci of hypoattenuation of bilateral basal ganglia likely reflecting sequela of prior lacunar type infarcts. Findings on a background of diffuse chronic microvascular angiopathy and parenchymal volume loss. No significant residual mass effect or midline shift. Vascular: Atherosclerotic calcification of the carotid siphons and intradural vertebral arteries. No hyperdense vessel. Skull: Postsurgical changes on the left frontal scalp with craniotomy defects likely for evacuation of the previously seen subdural hematoma. Some slightly increased left supraorbital/frontal scalp swelling may be related to new contusive change, correlate for point tenderness. No other sites of significant scalp swelling or hematoma. No calvarial fracture or other acute osseous injury. Sinuses/Orbits: Minimal mural thickening in the paranasal sinuses. No layering air-fluid levels or pneumatized secretions. Bilateral lens extractions and right scleral buckle. Other: None. IMPRESSION: 1. Redemonstration of the attenuation extra-axial blood products across the left frontal convexity which appear decreased in size from the prior exam now measuring up to a maximal thickness of 9 mm, previously 15 mm at a similar level. Resolution of the pneumocephaly seen on the comparison exam. No new sites of hemorrhage. 2. Some slightly increased left supraorbital/frontal scalp swelling may be related to new contusive change, correlate for point tenderness. No calvarial fracture. 3. Stable regions of encephalomalacia in the bilateral frontal lobes and remote lacunar infarcts in the basal ganglia. 4. Background of diffuse chronic microvascular angiopathy and parenchymal volume loss. 5. Intracranial atherosclerosis. Electronically Signed   By: Lovena Le M.D.   On: 12/05/2020 02:40   CT ANGIO NECK W OR WO CONTRAST  Result Date: 12/22/2020 CLINICAL DATA:  Stroke. EXAM: CT ANGIOGRAPHY HEAD AND NECK CT  PERFUSION HEAD TECHNIQUE: Multidetector CT imaging of the head and neck was performed using the standard protocol during bolus administration of intravenous contrast. Multiplanar CT image reconstructions and MIPs were obtained to evaluate the vascular anatomy. Carotid stenosis measurements (when applicable) are obtained utilizing NASCET criteria, using the distal internal carotid diameter as the denominator. Multiphase CT imaging of the brain was performed following IV bolus contrast injection. Subsequent parametric perfusion maps were calculated using RAPID software. CONTRAST:  170m OMNIPAQUE IOHEXOL 350 MG/ML SOLN COMPARISON:  Cerebral angiogram and intervention December 23, 2020. FINDINGS: CTA NECK FINDINGS Aortic arch: Standard branching. Imaged portion shows no evidence of aneurysm or dissection.  Calcified plaques in the aortic arch and at the origin of the major neck arteries without hemodynamically significant stenosis. Right carotid system: Atherosclerotic changes in the right carotid bifurcation with mixed density plaque without hemodynamically significant stenosis. Left carotid system: Atherosclerotic changes of the left carotid bifurcation with mixed density plaque without hemodynamically significant stenosis. Increased tortuosity of the upper cervical segment the left ICA. Vertebral arteries: Dominant left vertebral artery is fat hypoplastic right vertebral artery. No evidence of dissection, stenosis (50% or greater) or occlusion. Skeleton: No acute or aggressive lesion identified. Other neck: Negative. Upper chest: Bilateral pleural effusion, right greater than left. Bilateral pleuroparenchymal scarring, centrilobular and paraseptal emphysema. Review of the MIP images confirms the above findings CTA HEAD FINDINGS Anterior circulation: Calcified plaques in the bilateral carotid siphons with mild stenosis of the paraclinoid segment on the right. There is a mid M1/MCA occlusion with poor distal  opacification. Left MCA and bilateral ACA vascular tree are maintained. Posterior circulation: Calcified plaque in the V4 segment of the left vertebral artery without hemodynamically significant stenosis. Short segment occlusion of the right distal P2/PCA with adequate distal opacification. The basilar artery and left posterior cerebral arteries are maintained. Venous sinuses: As permitted by contrast timing, patent. Review of the MIP images confirms the above findings CT Brain Perfusion Findings: CBF (<30%) Volume: 19m Perfusion (Tmax>6.0s) volume: 1423mMismatch Volume: 12359mnfarction Location:Right MCA posterior division territory. IMPRESSION: 1. Right mid M1/MCA occlusion with poor distal opacification. 2. Short segment occlusion of the right distal P2/PCA with adequate distal opacification. 3. 24 mL right MCA posterior division territory core infarct and 123 mL right MCA posterior division territory penumbra. 4. Atherosclerotic changes of the bilateral carotid bifurcations without hemodynamically significant stenosis. 5. Bilateral pleural effusion, right greater than left. 6. Emphysema and biapical pleuroparenchymal scarring. 7. Emphysema and aortic atherosclerosis. These results were transmitted via secured paging system at the time of interpretation on 12/28/2020 at 4:29 pm to provider JINDes[facility] Center LLCortic Atherosclerosis (ICD10-I70.0) and Emphysema (ICD10-J43.9). Electronically Signed   By: KatPedro EarlsD.   On: 12/06/2020 16:46   CT ANGIO NECK W OR WO CONTRAST  Result Date: 01/02/2021 CLINICAL DATA:  Neuro deficit, acute, stroke suspected. EXAM: CT ANGIOGRAPHY HEAD AND NECK CT PERFUSION BRAIN TECHNIQUE: Multidetector CT imaging of the head and neck was performed using the standard protocol during bolus administration of intravenous contrast. Multiplanar CT image reconstructions and MIPs were obtained to evaluate the vascular anatomy. Carotid stenosis measurements (when applicable) are  obtained utilizing NASCET criteria, using the distal internal carotid diameter as the denominator. Multiphase CT imaging of the brain was performed following IV bolus contrast injection. Subsequent parametric perfusion maps were calculated using RAPID software. CONTRAST:  100m50mNIPAQUE IOHEXOL 350 MG/ML SOLN COMPARISON:  Noncontrast head CT performed earlier today 12/24/2020. Brain MRI performed earlier today 12/14/2020. MRA head 11/26/2020. FINDINGS: CTA NECK FINDINGS Aortic arch: Standard aortic branching atherosclerotic plaque within the visualized aortic arch and proximal major branch vessels of the neck. No hemodynamically significant innominate or proximal subclavian artery stenosis. Right carotid system: CCA patent within the neck to the bifurcation. Soft and calcified plaque within the carotid bifurcation and proximal ICA. There is occlusion or near occlusion of the cervical right ICA beginning at the proximal to mid aspect of this vessel. Left carotid system: CCA and ICA patent within the neck. Soft and calcified plaque within the carotid bifurcation and proximal ICA with less than 50% stenosis Vertebral arteries: The right vertebral artery is developmentally  diminutive, but patent throughout the neck dominant left vertebral artery, patent within the neck. Moderate/severe stenosis at the origin of the left vertebral artery. Minimal calcified plaque is also present within the proximal V2 segment without stenosis at this site Skeleton: No acute bony abnormality or aggressive osseous lesion. Cervical spondylosis. Other neck: No neck mass or cervical lymphadenopathy. Thyroid unremarkable. Upper chest: No consolidation within the imaged lung apices. Biapical pleuroparenchymal scarring. Partially imaged right pleural effusion. Review of the MIP images confirms the above findings CTA HEAD FINDINGS Anterior circulation: The right ICA remains occluded or near occluded intracranially throughout the siphon region.  There is reconstitution of enhancement within the vessel at the level of the supraclinoid segment. The right M1 middle cerebral arteries patent. High-grade focal stenosis within a proximal M2 inferior division branch (series 9, image 25). Atherosclerotic irregularity of additional proximal to mid right M2 MCA branch vessels. Most notably, there is segmental high-grade stenosis within a superior division mid M2 right MCA branch (series 10, image 14). The intracranial left ICA is patent. Calcified plaque within moderate stenosis of the supraclinoid segment. The left M1 middle cerebral artery is patent. No left M2 proximal branch occlusion or high-grade proximal stenosis is identified. The anterior cerebral arteries are patent. Posterior circulation: The intracranial vertebral arteries are patent. Mild calcified plaque within the intracranial left vertebral artery without stenosis. The posterior cerebral arteries are patent. A left posterior communicating artery is present. 2 mm medially projecting vascular protrusion arising from the supraclinoid right ICA which may reflect an aneurysm, infundibulum or origin of an otherwise occluded right posterior communicating artery. Venous sinuses: Within the limitations of contrast timing, no convincing thrombus. Anatomic variants: As described Review of the MIP images confirms the above findings CT Brain Perfusion Findings: CBF (<30%) Volume: 31m Perfusion (Tmax>6.0s) volume: 1070mMismatch Volume: Infinite Infarction Location:None identified These results were called by telephone at the time of interpretation on 01/02/2021 at 5:32 pm to provider [facility] who verbally acknowledged these results. IMPRESSION: CTA neck: 1. The cervical right ICA becomes occluded or near occluded at its proximal to mid aspect. The vessel remains occluded or near occluded throughout the remainder of the neck. 2. Left CCA and ICA patent within the neck with less than 50% stenosis. 3. Vertebral  arteries patent within the neck bilaterally. 4. Moderate/severe stenosis at the origin of the dominant left vertebral artery. CTA head: 1. The right ICA remains occluded or near occluded intracranially throughout the siphon region. There is reconstitution of enhancement within this vessel at the level of the supraclinoid segment. 2. High-grade focal stenosis within an inferior division proximal M2 right MCA branch vessel. 3. Atherosclerotic irregularity of additional proximal to mid right M2 MCA branch vessels. Most notably, there is segmental high-grade stenosis within a superior division mid M2 right MCA branch. 4. 2 mm medially projecting vascular protrusion arising from the supraclinoid right ICA. This may reflect an aneurysm, infundibulum or the origin of an otherwise occluded right posterior communicating artery. CT perfusion head: The perfusion software identifies no core infarct. The small acute infarcts demonstrated on the brain MRI performed earlier today are not detected. The perfusion software identifies a 109 mL region of hypoperfusion, predominantly within the right cerebral hemisphere. Electronically Signed   By: KyKellie SimmeringO   On: 12/10/2020 17:48   MR ANGIO HEAD WO CONTRAST  Result Date: 01/02/2021 CLINICAL DATA:  Follow-up examination for acute stroke. EXAM: MRI HEAD WITHOUT CONTRAST MRA HEAD WITHOUT CONTRAST TECHNIQUE: Multiplanar, multiecho  pulse sequences of the brain and surrounding structures were obtained without intravenous contrast. Angiographic images of the head were obtained using MRA technique without contrast. COMPARISON:  Prior studies from 02/24/2021 and earlier FINDINGS: MRI HEAD FINDINGS Brain: Age-related cerebral atrophy with chronic microvascular ischemic disease again noted. Remote cortical infarcts involving the bilateral frontal lobes noted. Associated chronic hemosiderin staining at the chronic right frontal infarct. Additional chronic infarct at the left caudate  head. Since most recent MRI, there are multiple new cortical and subcortical infarcts involving the right cerebral hemisphere, primarily involving the right temporal lobe, consistent with previously identified right M1 occlusion. Patchy involvement of the right basal ganglia noted. Additionally, there are a few new scattered subcentimeter ischemic infarcts involving the left cerebral hemisphere at the high left frontal and occipital lobes. No associated hemorrhage or mass effect. Continued interval evolution of recently identified bilateral cerebral infarcts noted. Subacute subdural hematoma again seen overlying the left frontal convexity measuring up to 6 mm in maximal thickness. Minimal mass effect on the underlying left cerebral hemisphere without midline shift. No new hemorrhage. No mass lesion or hydrocephalus. No new extra-axial fluid collection. Vascular: Major intracranial vascular flow voids are maintained. Skull and upper cervical spine: Degenerative thickening about the dens and tectorial membrane. Craniocervical junction otherwise unremarkable. Bone marrow signal intensity normal. Sequelae of prior left burr hole craniotomy for subdural evacuation. Scalp soft tissues demonstrate no acute finding. Sinuses/Orbits: Prior bilateral ocular lens replacement with scleral banding on the right. Scattered mucosal thickening noted throughout the paranasal sinuses. Trace bilateral mastoid effusions, of doubtful significance. Other: None. MRA HEAD FINDINGS ANTERIOR CIRCULATION: Distal cervical segments of both internal carotid arteries are widely patent. Petrous, cavernous, and supraclinoid segments widely patent without stenosis or other abnormality. A1 segments widely patent. Normal anterior communicating artery complex. Anterior cerebral arteries widely patent to their distal aspects. Right M1 segment now widely patent status post revascularization. Left M1 remains patent. Distal MCA branches well perfused and  symmetric. POSTERIOR CIRCULATION: Both vertebral arteries widely patent to the vertebrobasilar junction. Dominant left V4 segment with hypoplastic right vertebral artery. Patent left PICA. Right PICA not seen. Basilar widely patent to its distal aspect without stenosis. Superior cerebral arteries patent bilaterally. Both PCAs widely patent and well perfused to their distal aspects. Bilateral posterior communicating arteries noted, more prominent on the left. No intracranial aneurysm IMPRESSION: MRI HEAD IMPRESSION: 1. Interval development of multiple new acute ischemic infarcts involving the right cerebral hemisphere, primarily involving the right temporal lobe, consistent with recent right M1 occlusion. No associated hemorrhage or mass effect. 2. Continued interval evolution of additional recently identified small volume bilateral cerebral infarcts. 3. 6 mm subacute subdural hematoma overlying the left frontal convexity without significant mass effect, unchanged. 4. Underlying age-related cerebral atrophy with chronic microvascular ischemic disease and chronic bifrontal infarcts. MRA HEAD IMPRESSION: 1. Interval revascularization of previously identified right M1 occlusion, with wide patency of the right MCA now seen. 2. Otherwise stable and normal intracranial MRA. Electronically Signed   By: Jeannine Boga M.D.   On: 12/22/2020 05:07   MR ANGIO HEAD WO CONTRAST  Result Date: 12/24/2020 CLINICAL DATA:  ICA occlusion post revascularization EXAM: MRA HEAD WITHOUT CONTRAST TECHNIQUE: Angiographic images of the Circle of Willis were obtained using MRA technique without intravenous contrast. COMPARISON:  None. FINDINGS: Motion artifact is present. Minimally included distal cervical right ICA and intracranial right ICA patent. Suspect moderate narrowing at the clinoid. Middle and anterior cerebral arteries are patent. Partially included intracranial vertebral  arteries, basilar artery, posterior cerebral  arteries are patent. IMPRESSION: Degraded by motion. Right ICA is now patent. Suspect moderate narrowing at the clinoid. Electronically Signed   By: Macy Mis M.D.   On: 12/24/2020 17:42   MR BRAIN WO CONTRAST  Result Date: 01/01/2021 CLINICAL DATA:  Follow-up examination for acute stroke. EXAM: MRI HEAD WITHOUT CONTRAST MRA HEAD WITHOUT CONTRAST TECHNIQUE: Multiplanar, multiecho pulse sequences of the brain and surrounding structures were obtained without intravenous contrast. Angiographic images of the head were obtained using MRA technique without contrast. COMPARISON:  Prior studies from 02/24/2021 and earlier FINDINGS: MRI HEAD FINDINGS Brain: Age-related cerebral atrophy with chronic microvascular ischemic disease again noted. Remote cortical infarcts involving the bilateral frontal lobes noted. Associated chronic hemosiderin staining at the chronic right frontal infarct. Additional chronic infarct at the left caudate head. Since most recent MRI, there are multiple new cortical and subcortical infarcts involving the right cerebral hemisphere, primarily involving the right temporal lobe, consistent with previously identified right M1 occlusion. Patchy involvement of the right basal ganglia noted. Additionally, there are a few new scattered subcentimeter ischemic infarcts involving the left cerebral hemisphere at the high left frontal and occipital lobes. No associated hemorrhage or mass effect. Continued interval evolution of recently identified bilateral cerebral infarcts noted. Subacute subdural hematoma again seen overlying the left frontal convexity measuring up to 6 mm in maximal thickness. Minimal mass effect on the underlying left cerebral hemisphere without midline shift. No new hemorrhage. No mass lesion or hydrocephalus. No new extra-axial fluid collection. Vascular: Major intracranial vascular flow voids are maintained. Skull and upper cervical spine: Degenerative thickening about the dens  and tectorial membrane. Craniocervical junction otherwise unremarkable. Bone marrow signal intensity normal. Sequelae of prior left burr hole craniotomy for subdural evacuation. Scalp soft tissues demonstrate no acute finding. Sinuses/Orbits: Prior bilateral ocular lens replacement with scleral banding on the right. Scattered mucosal thickening noted throughout the paranasal sinuses. Trace bilateral mastoid effusions, of doubtful significance. Other: None. MRA HEAD FINDINGS ANTERIOR CIRCULATION: Distal cervical segments of both internal carotid arteries are widely patent. Petrous, cavernous, and supraclinoid segments widely patent without stenosis or other abnormality. A1 segments widely patent. Normal anterior communicating artery complex. Anterior cerebral arteries widely patent to their distal aspects. Right M1 segment now widely patent status post revascularization. Left M1 remains patent. Distal MCA branches well perfused and symmetric. POSTERIOR CIRCULATION: Both vertebral arteries widely patent to the vertebrobasilar junction. Dominant left V4 segment with hypoplastic right vertebral artery. Patent left PICA. Right PICA not seen. Basilar widely patent to its distal aspect without stenosis. Superior cerebral arteries patent bilaterally. Both PCAs widely patent and well perfused to their distal aspects. Bilateral posterior communicating arteries noted, more prominent on the left. No intracranial aneurysm IMPRESSION: MRI HEAD IMPRESSION: 1. Interval development of multiple new acute ischemic infarcts involving the right cerebral hemisphere, primarily involving the right temporal lobe, consistent with recent right M1 occlusion. No associated hemorrhage or mass effect. 2. Continued interval evolution of additional recently identified small volume bilateral cerebral infarcts. 3. 6 mm subacute subdural hematoma overlying the left frontal convexity without significant mass effect, unchanged. 4. Underlying age-related  cerebral atrophy with chronic microvascular ischemic disease and chronic bifrontal infarcts. MRA HEAD IMPRESSION: 1. Interval revascularization of previously identified right M1 occlusion, with wide patency of the right MCA now seen. 2. Otherwise stable and normal intracranial MRA. Electronically Signed   By: Jeannine Boga M.D.   On: 12/05/2020 05:07   MR BRAIN WO CONTRAST  Result Date: 12/28/2020 CLINICAL DATA:  Mental status change. Burr hole drainage of subdural hematoma 11/21/2020 EXAM: MRI HEAD WITHOUT CONTRAST TECHNIQUE: Multiplanar, multiecho pulse sequences of the brain and surrounding structures were obtained without intravenous contrast. COMPARISON:  MRI head 11/26/2020.  CT head 12/16/2020 FINDINGS: Brain: Multiple small areas of restricted diffusion compatible with acute infarcts involving the right basal ganglia, right parietal white matter, and right frontal parietal white matter, and right parietal cortex. These were not present on the MRI of 11/27/2019. Previous area of restricted diffusion in the left hippocampus has resolved likely due to recent infarct. Subacute subdural hematoma on the left is unchanged measuring approximately 7 mm in thickness. Ventricle size normal. No midline shift There is generalized atrophy. Moderate white matter changes bilaterally most likely chronic microvascular ischemia Motion degraded study.  Patient terminated the procedure early Vascular: Loss of flow void in the right internal carotid artery compatible with occlusion. This was patent on the prior MRI and MRA. Skull and upper cervical spine: No focal skeletal abnormality. Sinuses/Orbits: Paranasal sinuses clear. Bilateral cataract extraction. Other: None IMPRESSION: Multiple small areas of acute infarct in the right cerebral hemisphere. Interval resolution of small infarct in the left hippocampus. Loss of flow void right internal carotid artery compatible with interval occlusion Subacute subdural hematoma  left 7 mm, unchanged. These results were called by telephone at the time of interpretation on 12/24/2020 at 4:26 pm to provider [facility] , who verbally acknowledged these results. Electronically Signed   By: Franchot Gallo M.D.   On: 12/10/2020 16:27   IR CT Head Ltd  Result Date: 12/22/2020 INDICATION: New onset slurred speech, confusion and left-sided weakness and right gaze preference. EXAM: 1. EMERGENT LARGE VESSEL OCCLUSION THROMBOLYSIS (anterior CIRCULATION) COMPARISON:  MRI scan 12/15/2020, and CT angiogram of the head and neck of 12/16/2020. MEDICATIONS: Ancef 2 g IV antibiotic was administered within 1 hour of the procedure. ANESTHESIA/SEDATION: General anesthesia CONTRAST:  Isovue 300 50 mL FLUOROSCOPY TIME:  Fluoroscopy Time: 45 minutes 36 seconds (1333 mGy). COMPLICATIONS: None immediate. TECHNIQUE: Following a full explanation of the procedure along with the potential associated complications, an informed witnessed consent was obtained. The risks of intracranial hemorrhage of 10%, worsening neurological deficit, ventilator dependency, death and inability to revascularize were all reviewed in detail with the patient's family. The patient was then put under general anesthesia by the Department of Anesthesiology at [facility]. The right groin was prepped and draped in the usual sterile fashion. Thereafter using modified Seldinger technique, transfemoral access into the right common femoral artery was obtained without difficulty. Over a 0.035 inch guidewire an 8 French 25 cm Pinnacle sheath was inserted. Through this, and also over a 0.035 inch guidewire a 5 French Simmons 2 support catheter inside an 087 balloon guide catheter was advanced to the aortic arch region and selectively positioned in the right common carotid artery at the bifurcation. The support Simmons 2 and catheter, and the guidewire were removed. Good aspiration obtained from the hub of the balloon guide catheter at the  right common carotid bifurcation. FINDINGS: A gentle control arteriogram performed through the balloon guide catheter demonstrated right external carotid artery and its branches to be widely patent. The right internal carotid artery at the bulb and just distally demonstrates complete angiographic occlusion. No evidence of a delayed string sign noted. No distal reconstitution of the right middle cerebral artery from the external carotid artery branches was noted either. PROCEDURE: Through the balloon guide catheter, over a 016  inch Fathom micro guidewire a 162 cm 021 Trevo ProVue microcatheter inside an 071 130 cm Zoom aspiration catheter combination was advanced gingerly, slowly and deliberately into the right middle cerebral artery inferior division M2 segment followed by the microcatheter. Zoom aspiration catheter was advanced into the mid M1 segment. A 6.5 mm x 45 mm Embotrap retrieval device was then advanced to the distal end of the microcatheter. This was then deployed by retrieving the microcatheter which was then retrieved further into the right cervical ICA. Thereafter, with proximal flow arrest in the right internal carotid artery with the balloon guide catheter, and constant aspiration at the hub of the balloon guide catheter with a 20 mL syringe, and with Penumbra aspiration device at the hub 071 Zoom aspiration catheter for approximately 2 minutes, the combination was retrieved and removed. Following reversal of flow arrest, a control arteriogram performed through the balloon guide in the right common carotid artery demonstrated complete revascularization of the right middle cerebral artery and the right anterior cerebral artery distributions. No evidence of irregularities or of intimal flaps or filling defects were seen within the internal carotid artery extra cranially or intracranially. Extensive clots were seen in the canister of the aspiration device. Few clots were seen entangled in the retrieval  device itself. Throughout the procedure the patient's blood pressure and neurological status remained stable. Balloon guide was removed. The 8 French neurovascular sheath was removed and replaced with an 8 French Angio-Seal closure device for hemostasis. The right groin appeared soft without evidence of hemorrhage. Distal pulses remained Dopplerable in both lower extremities. Post CT of the brain demonstrated no evidence of intracranial hemorrhage, mass effect or midline shift. Patient was left intubated to protect the airway. Patient was then transferred to neuro ICU for post thrombectomy management. IMPRESSION: Status post endovascular complete revascularization of occluded internal carotid artery extra cranially and intracranially, and of the right middle cerebral artery with 1 pass with 6.5 mm x 45 mm Embotrap retrieval device with aspiration and proximal flow arrest achieving a TICI 3 revascularization. PLAN: Follow-up in the clinic 4 weeks post discharge p.r.n. Electronically Signed   By: Luanne Bras M.D.   On: 12/24/2020 15:12   CT CEREBRAL PERFUSION W CONTRAST  Result Date: 12/13/2020 CLINICAL DATA:  Stroke. EXAM: CT ANGIOGRAPHY HEAD AND NECK CT PERFUSION HEAD TECHNIQUE: Multidetector CT imaging of the head and neck was performed using the standard protocol during bolus administration of intravenous contrast. Multiplanar CT image reconstructions and MIPs were obtained to evaluate the vascular anatomy. Carotid stenosis measurements (when applicable) are obtained utilizing NASCET criteria, using the distal internal carotid diameter as the denominator. Multiphase CT imaging of the brain was performed following IV bolus contrast injection. Subsequent parametric perfusion maps were calculated using RAPID software. CONTRAST:  166m OMNIPAQUE IOHEXOL 350 MG/ML SOLN COMPARISON:  Cerebral angiogram and intervention December 23, 2020. FINDINGS: CTA NECK FINDINGS Aortic arch: Standard branching. Imaged portion  shows no evidence of aneurysm or dissection. Calcified plaques in the aortic arch and at the origin of the major neck arteries without hemodynamically significant stenosis. Right carotid system: Atherosclerotic changes in the right carotid bifurcation with mixed density plaque without hemodynamically significant stenosis. Left carotid system: Atherosclerotic changes of the left carotid bifurcation with mixed density plaque without hemodynamically significant stenosis. Increased tortuosity of the upper cervical segment the left ICA. Vertebral arteries: Dominant left vertebral artery is fat hypoplastic right vertebral artery. No evidence of dissection, stenosis (50% or greater) or occlusion. Skeleton: No acute or  aggressive lesion identified. Other neck: Negative. Upper chest: Bilateral pleural effusion, right greater than left. Bilateral pleuroparenchymal scarring, centrilobular and paraseptal emphysema. Review of the MIP images confirms the above findings CTA HEAD FINDINGS Anterior circulation: Calcified plaques in the bilateral carotid siphons with mild stenosis of the paraclinoid segment on the right. There is a mid M1/MCA occlusion with poor distal opacification. Left MCA and bilateral ACA vascular tree are maintained. Posterior circulation: Calcified plaque in the V4 segment of the left vertebral artery without hemodynamically significant stenosis. Short segment occlusion of the right distal P2/PCA with adequate distal opacification. The basilar artery and left posterior cerebral arteries are maintained. Venous sinuses: As permitted by contrast timing, patent. Review of the MIP images confirms the above findings CT Brain Perfusion Findings: CBF (<30%) Volume: 66m Perfusion (Tmax>6.0s) volume: 1446mMismatch Volume: 12366mnfarction Location:Right MCA posterior division territory. IMPRESSION: 1. Right mid M1/MCA occlusion with poor distal opacification. 2. Short segment occlusion of the right distal P2/PCA with  adequate distal opacification. 3. 24 mL right MCA posterior division territory core infarct and 123 mL right MCA posterior division territory penumbra. 4. Atherosclerotic changes of the bilateral carotid bifurcations without hemodynamically significant stenosis. 5. Bilateral pleural effusion, right greater than left. 6. Emphysema and biapical pleuroparenchymal scarring. 7. Emphysema and aortic atherosclerosis. These results were transmitted via secured paging system at the time of interpretation on 01/02/2021 at 4:29 pm to [facility] (ICD10-I70.0) and Emphysema (ICD10-J43.9). Electronically Signed   By: KatPedro EarlsD.   On: 12/24/2020 16:46   CT CEREBRAL PERFUSION W CONTRAST  Result Date: 12/24/2020 CLINICAL DATA:  Neuro deficit, acute, stroke suspected. EXAM: CT ANGIOGRAPHY HEAD AND NECK CT PERFUSION BRAIN TECHNIQUE: Multidetector CT imaging of the head and neck was performed using the standard protocol during bolus administration of intravenous contrast. Multiplanar CT image reconstructions and MIPs were obtained to evaluate the vascular anatomy. Carotid stenosis measurements (when applicable) are obtained utilizing NASCET criteria, using the distal internal carotid diameter as the denominator. Multiphase CT imaging of the brain was performed following IV bolus contrast injection. Subsequent parametric perfusion maps were calculated using RAPID software. CONTRAST:  100m29mNIPAQUE IOHEXOL 350 MG/ML SOLN COMPARISON:  Noncontrast head CT performed earlier today 12/24/2020. Brain MRI performed earlier today 12/29/2020. MRA head 11/26/2020. FINDINGS: CTA NECK FINDINGS Aortic arch: Standard aortic branching atherosclerotic plaque within the visualized aortic arch and proximal major branch vessels of the neck. No hemodynamically significant innominate or proximal subclavian artery stenosis. Right carotid system: CCA patent within the neck to the bifurcation. Soft  and calcified plaque within the carotid bifurcation and proximal ICA. There is occlusion or near occlusion of the cervical right ICA beginning at the proximal to mid aspect of this vessel. Left carotid system: CCA and ICA patent within the neck. Soft and calcified plaque within the carotid bifurcation and proximal ICA with less than 50% stenosis Vertebral arteries: The right vertebral artery is developmentally diminutive, but patent throughout the neck dominant left vertebral artery, patent within the neck. Moderate/severe stenosis at the origin of the left vertebral artery. Minimal calcified plaque is also present within the proximal V2 segment without stenosis at this site Skeleton: No acute bony abnormality or aggressive osseous lesion. Cervical spondylosis. Other neck: No neck mass or cervical lymphadenopathy. Thyroid unremarkable. Upper chest: No consolidation within the imaged lung apices. Biapical pleuroparenchymal scarring. Partially imaged right pleural effusion. Review of the MIP images confirms the above findings CTA HEAD FINDINGS Anterior circulation: The  right ICA remains occluded or near occluded intracranially throughout the siphon region. There is reconstitution of enhancement within the vessel at the level of the supraclinoid segment. The right M1 middle cerebral arteries patent. High-grade focal stenosis within a proximal M2 inferior division branch (series 9, image 25). Atherosclerotic irregularity of additional proximal to mid right M2 MCA branch vessels. Most notably, there is segmental high-grade stenosis within a superior division mid M2 right MCA branch (series 10, image 14). The intracranial left ICA is patent. Calcified plaque within moderate stenosis of the supraclinoid segment. The left M1 middle cerebral artery is patent. No left M2 proximal branch occlusion or high-grade proximal stenosis is identified. The anterior cerebral arteries are patent. Posterior circulation: The intracranial  vertebral arteries are patent. Mild calcified plaque within the intracranial left vertebral artery without stenosis. The posterior cerebral arteries are patent. A left posterior communicating artery is present. 2 mm medially projecting vascular protrusion arising from the supraclinoid right ICA which may reflect an aneurysm, infundibulum or origin of an otherwise occluded right posterior communicating artery. Venous sinuses: Within the limitations of contrast timing, no convincing thrombus. Anatomic variants: As described Review of the MIP images confirms the above findings CT Brain Perfusion Findings: CBF (<30%) Volume: 75m Perfusion (Tmax>6.0s) volume: 1049mMismatch Volume: Infinite Infarction Location:None identified These results were called by telephone at the time of interpretation on 12/31/2020 at 5:32 pm to [facility] verbally acknowledged these results. IMPRESSION: CTA neck: 1. The cervical right ICA becomes occluded or near occluded at its proximal to mid aspect. The vessel remains occluded or near occluded throughout the remainder of the neck. 2. Left CCA and ICA patent within the neck with less than 50% stenosis. 3. Vertebral arteries patent within the neck bilaterally. 4. Moderate/severe stenosis at the origin of the dominant left vertebral artery. CTA head: 1. The right ICA remains occluded or near occluded intracranially throughout the siphon region. There is reconstitution of enhancement within this vessel at the level of the supraclinoid segment. 2. High-grade focal stenosis within an inferior division proximal M2 right MCA branch vessel. 3. Atherosclerotic irregularity of additional proximal to mid right M2 MCA branch vessels. Most notably, there is segmental high-grade stenosis within a superior division mid M2 right MCA branch. 4. 2 mm medially projecting vascular protrusion arising from the supraclinoid right ICA. This may reflect an aneurysm, infundibulum or the origin of an  otherwise occluded right posterior communicating artery. CT perfusion head: The perfusion software identifies no core infarct. The small acute infarcts demonstrated on the brain MRI performed earlier today are not detected. The perfusion software identifies a 109 mL region of hypoperfusion, predominantly within the right cerebral hemisphere. Electronically Signed   By: KyKellie SimmeringO   On: 12/07/2020 17:48   DG CHEST PORT 1 VIEW  Result Date: 12/03/2020 CLINICAL DATA:  [age]ear old male status post intubation. EXAM: PORTABLE CHEST 1 VIEW COMPARISON:  Chest radiograph dated 12/13/2020. FINDINGS: Endotracheal tube with tip approximately 8 cm above the carina. Left lung base streaky atelectasis/scarring. No new consolidation. There is no pleural effusion pneumothorax. Stable cardiomegaly. Aortic valve repair. Atherosclerotic calcification of the aorta. No acute osseous pathology. Degenerative changes of the spine. IMPRESSION: 1. Endotracheal tube above the carina. 2. No consolidation. Electronically Signed   By: ArAnner Crete.D.   On: 12/05/2020 20:52   DG CHEST PORT 1 VIEW  Result Date: 12/19/2020 CLINICAL DATA:  Intubation EXAM: PORTABLE CHEST 1 VIEW COMPARISON:  None. FINDINGS: Endotracheal tube  is 7 cm above the carina. NG tube enters the stomach, the tip projecting off the lower aspect of the image. Heart is borderline in size. Prior TAVR. Mitral valve annular calcifications. No confluent airspace opacities or effusions. IMPRESSION: Endotracheal tube 7 cm above the carina. No acute cardiopulmonary disease. Electronically Signed   By: Rolm Baptise M.D.   On: 12/10/2020 22:04   DG Chest Port 1 View  Result Date: 12/29/2020 CLINICAL DATA:  87 year old male with sepsis. EXAM: PORTABLE CHEST 1 VIEW COMPARISON:  Chest radiograph dated 11/27/2020. FINDINGS: No focal consolidation, pleural effusion, or pneumothorax. Mild cardiomegaly. Atherosclerotic calcification of the aorta. Aortic valve repair and  calcification of the mitral annulus. No acute osseous pathology. IMPRESSION: 1. No acute cardiopulmonary process. 2. Mild cardiomegaly. Electronically Signed   By: Anner Crete M.D.   On: 12/24/2020 02:43   EEG adult  Result Date: 12/24/2020 Raenette Rover, MD     01/02/2021  1:03 PM EEG Report Indication: Altered mental status--known L SDH/craniotomy This study was recorded in the waking/drowsy vs encephalopathic state.  The duration of the study was 28 minutes.  Electrodes were placed according to the International 10/20 system.  Video was reviewed for clinical correlation as needed.  There is some degree of waking organization during periods where there is muscle artifact indicative of a more alert state--there is a discernible though very attenuated anterior - posterior voltage and frequency gradient.  In the occipital leads there is a possible (definitive reactivity is not confirmed) but symmetric posterior dominant rhythm of approximately 7 hertz, which is significantly slower than expected for age, very poorly sustained and indistinct.  Anteriorly, is the expected pattern of faster frequency, lower voltage waveforms though in a significantly reduced amount as compared to normal.  There is intermittent delta slowing in the anterior>posterior leads which is grossly symmetric.  In general, waveforms are slightly higher in amplitude in the left hemisphere and better defined, although this is most likely related to breach artifact.  In addition, early loss of leads in the right posterior quadrant makes the comparison difficult. During the less alert (no muscle artifact/blink artifiact) there is the disappearance of the gradient, a general shift to slower frequencies, disappearance of the suspected posterior dominant rhythm, but no sleep architecture.  Hyperventilation: deferred Photic stimulation: deferred  There are no clear paroxysmal or epileptiform abnormalities or interhemispheric asymmetries seen  during this portion of the recording.  Impression:  This is an abnormal waking and drowsy study due to very attenuated organization as described above.  In addition, there is some degree of focality, with more distinct waveforms/slightly higher amplitudes in the left hemisphere--this is favored to be due to breach artifact however, and early lead loss in the right posterior quadrant makes an exact comparison difficult.  These findings are most consistent with a mild-moderate diffuse encephalopathy, with possible/not definitive increased areas of focal neuronal dysfunction in the right (artifact is more favored) and possibly in the anterior regions (bilaterally).  There are no clear epileptiform abnormalities.  VAS [place] UPPER EXTREMITY ARTERIAL DUPLEX  Result Date: 12/09/2020 UPPER EXTREMITY DUPLEX STUDY Indications: Patient complains of cool, cyanotic right upper extremity with              prolonged capillary refill.  Risk Factors:  Prior CVA. 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 Bilateral testing is considered an integral part of a complete examination. Limited examinations for reoccurring indications may be performed as noted.  Right Doppler Findings: +--------------+----------+--------------------------+--------+----------------+ Site          PSV (cm/s)Waveform                  StenosisComments         +--------------+----------+--------------------------+--------+----------------+ Subclavian    123                                                          Dist                                                                       +--------------+----------+--------------------------+--------+----------------+ Axillary                                           occluded                 +--------------+----------+--------------------------+--------+----------------+ Brachial Prox 15                                          Partial thrombus +--------------+----------+--------------------------+--------+----------------+ Brachial Mid  12        severely dampened                                                          monophasic                                         +--------------+----------+--------------------------+--------+----------------+ Brachial Dist 13        severely dampened                                                          monophasic                                         +--------------+----------+--------------------------+--------+----------------+ Radial Prox   9         severely dampened  monophasic                                         +--------------+----------+--------------------------+--------+----------------+ Radial Mid                                                appears occluded +--------------+----------+--------------------------+--------+----------------+ Radial Dist                                               appears occluded +--------------+----------+--------------------------+--------+----------------+ Ulnar Prox                                                poorly                                                                     visualized       +--------------+----------+--------------------------+--------+----------------+ Ulnar Dist    10        severely dampened                                                          monophasic                                         +--------------+----------+--------------------------+--------+----------------+    Summary:  Right: Right axillary artery appears occluded with partial thrombus        noted in the proximal  brachial artery. The radial artery        demonstrates partial occlusion. *See table(s) above for measurements and observations.    Preliminary    IR PERCUTANEOUS ART THROMBECTOMY/INFUSION INTRACRANIAL INC DIAG ANGIO  Result Date: 12/12/2020 INDICATION: New onset slurred speech, confusion and left-sided weakness and right gaze preference. EXAM: 1. EMERGENT LARGE VESSEL OCCLUSION THROMBOLYSIS (anterior CIRCULATION) COMPARISON:  MRI scan 12/24/2020, and CT angiogram of the head and neck of 12/19/2020. MEDICATIONS: Ancef 2 g IV antibiotic was administered within 1 hour of the procedure. ANESTHESIA/SEDATION: General anesthesia CONTRAST:  Isovue 300 50 mL FLUOROSCOPY TIME:  Fluoroscopy Time: 45 minutes 36 seconds (1333 mGy). COMPLICATIONS: None immediate. TECHNIQUE: Following a full explanation of the procedure along with the potential associated complications, an informed witnessed consent was obtained. The risks of intracranial hemorrhage of 10%, worsening neurological deficit, ventilator dependency, death and inability to revascularize were all reviewed in detail with the patient's family. The patient was then put under general anesthesia by the Department of Anesthesiology at [facility]. The right groin was prepped and draped in the usual sterile  fashion. Thereafter using modified Seldinger technique, transfemoral access into the right common femoral artery was obtained without difficulty. Over a 0.035 inch guidewire an 8 French 25 cm Pinnacle sheath was inserted. Through this, and also over a 0.035 inch guidewire a 5 French Simmons 2 support catheter inside an 087 balloon guide catheter was advanced to the aortic arch region and selectively positioned in the right common carotid artery at the bifurcation. The support Simmons 2 and catheter, and the guidewire were removed. Good aspiration obtained from the hub of the balloon guide catheter at the right common carotid bifurcation. FINDINGS: A gentle  control arteriogram performed through the balloon guide catheter demonstrated right external carotid artery and its branches to be widely patent. The right internal carotid artery at the bulb and just distally demonstrates complete angiographic occlusion. No evidence of a delayed string sign noted. No distal reconstitution of the right middle cerebral artery from the external carotid artery branches was noted either. PROCEDURE: Through the balloon guide catheter, over a 016 inch Fathom micro guidewire a 162 cm 021 Trevo ProVue microcatheter inside an 071 130 cm Zoom aspiration catheter combination was advanced gingerly, slowly and deliberately into the right middle cerebral artery inferior division M2 segment followed by the microcatheter. Zoom aspiration catheter was advanced into the mid M1 segment. A 6.5 mm x 45 mm Embotrap retrieval device was then advanced to the distal end of the microcatheter. This was then deployed by retrieving the microcatheter which was then retrieved further into the right cervical ICA. Thereafter, with proximal flow arrest in the right internal carotid artery with the balloon guide catheter, and constant aspiration at the hub of the balloon guide catheter with a 20 mL syringe, and with Penumbra aspiration device at the hub 071 Zoom aspiration catheter for approximately 2 minutes, the combination was retrieved and removed. Following reversal of flow arrest, a control arteriogram performed through the balloon guide in the right common carotid artery demonstrated complete revascularization of the right middle cerebral artery and the right anterior cerebral artery distributions. No evidence of irregularities or of intimal flaps or filling defects were seen within the internal carotid artery extra cranially or intracranially. Extensive clots were seen in the canister of the aspiration device. Few clots were seen entangled in the retrieval device itself. Throughout the procedure the patient's  blood pressure and neurological status remained stable. Balloon guide was removed. The 8 French neurovascular sheath was removed and replaced with an 8 French Angio-Seal closure device for hemostasis. The right groin appeared soft without evidence of hemorrhage. Distal pulses remained Dopplerable in both lower extremities. Post CT of the brain demonstrated no evidence of intracranial hemorrhage, mass effect or midline shift. Patient was left intubated to protect the airway. Patient was then transferred to neuro ICU for post thrombectomy management. IMPRESSION: Status post endovascular complete revascularization of occluded internal carotid artery extra cranially and intracranially, and of the right middle cerebral artery with 1 pass with 6.5 mm x 45 mm Embotrap retrieval device with aspiration and proximal flow arrest achieving a TICI 3 revascularization. PLAN: Follow-up in the clinic 4 weeks post discharge p.r.n. Electronically Signed   By: Luanne Bras M.D.   On: 12/24/2020 15:12   VAS [place] LOWER EXTREMITY VENOUS (DVT)  Result Date: 12/08/2020  Lower Venous DVT Study Indications: Swelling, and Edema.  Limitations: Patient's positioning and altered mental status. Comparison Study: no prior Performing Technologist: Sharion Dove RVS  Examination Guidelines: A complete evaluation includes B-mode imaging, spectral Doppler, color  Doppler, and power Doppler as needed of all accessible portions of each vessel. Bilateral testing is considered an integral part of a complete examination. Limited examinations for reoccurring indications may be performed as noted. The reflux portion of the exam is performed with the patient in reverse Trendelenburg.  +---------+---------------+---------+-----------+----------+-------------------+ RIGHT    CompressibilityPhasicitySpontaneityPropertiesThrombus Aging      +---------+---------------+---------+-----------+----------+-------------------+ CFV      Full                                                              +---------+---------------+---------+-----------+----------+-------------------+ SFJ      Full                                                             +---------+---------------+---------+-----------+----------+-------------------+ FV Prox  Full                                                             +---------+---------------+---------+-----------+----------+-------------------+ FV Mid   Full                                                             +---------+---------------+---------+-----------+----------+-------------------+ FV DistalFull                                                             +---------+---------------+---------+-----------+----------+-------------------+ PFV      Full                                                             +---------+---------------+---------+-----------+----------+-------------------+ POP                                                   patent by color and                                                       Doppler             +---------+---------------+---------+-----------+----------+-------------------+ PTV      None  Acute               +---------+---------------+---------+-----------+----------+-------------------+ PERO     None                                         Acute               +---------+---------------+---------+-----------+----------+-------------------+   +---------+---------------+---------+-----------+----------+-------------------+ LEFT     CompressibilityPhasicitySpontaneityPropertiesThrombus Aging      +---------+---------------+---------+-----------+----------+-------------------+ CFV      Full           Yes      Yes                                      +---------+---------------+---------+-----------+----------+-------------------+ SFJ      Full                                                              +---------+---------------+---------+-----------+----------+-------------------+ FV Prox  Full                                                             +---------+---------------+---------+-----------+----------+-------------------+ FV Mid   Full                                                             +---------+---------------+---------+-----------+----------+-------------------+ FV DistalFull                                                             +---------+---------------+---------+-----------+----------+-------------------+ PFV      Full                                                             +---------+---------------+---------+-----------+----------+-------------------+ POP                                                   patent by color and                                                       Doppler             +---------+---------------+---------+-----------+----------+-------------------+  PTV      Full                                                             +---------+---------------+---------+-----------+----------+-------------------+ PERO     None                                         Acute               +---------+---------------+---------+-----------+----------+-------------------+     Summary: RIGHT: - Findings consistent with acute deep vein thrombosis involving the right posterior tibial veins, and right peroneal veins.  LEFT: - Findings consistent with acute deep vein thrombosis involving the left peroneal veins.  *See table(s) above for measurements and observations. Electronically signed by Servando Snare MD on 12/08/2020 at 10:18:06 PM.    Final     Labs:  CBC: Recent Labs    12/10/2020 2115 12/24/20 0514 12/11/2020 0523 12/31/2020 0522  WBC 11.7* 9.2 7.0 7.3  HGB 10.7* 9.9* 9.7* 10.0*  HCT 32.9* 30.0* 29.8* 29.3*  PLT 137* 126* 129* 142*    COAGS: Recent  Labs    10/19/20 0324 10/19/20 1424 10/22/20 0456 11/19/20 1751  INR  --   --  1.1 1.3*  APTT 123* 108* 35 36    BMP: Recent Labs    08/12/20 1137 10/16/20 1000 10/30/20 1520 11/19/20 1751 12/18/2020 2115 12/24/20 0514 12/16/2020 0523 01/01/2021 0522  NA 142   < > 141   < > 136 139 136 138  K 4.8   < > 4.9   < > 3.9 4.1 3.9 3.8  CL 108*   < > 104   < > 109 110 109 109  CO2 22   < > 23   < > 21* 21* 23 20*  GLUCOSE 104*   < > 119*   < > 100* 105* 93 117*  BUN 24   < > 28*   < > 21 22 21 18   CALCIUM 9.1   < > 8.8   < > 8.0* 8.1* 7.8* 7.8*  CREATININE 1.35*   < > 1.28*   < > 1.18 1.16 1.18 1.41*  GFRNONAA 46*   < > 49*   < > 59* >60 59* 48*  GFRAA 53*  --  57*  --   --   --   --   --    < > = values in this interval not displayed.    LIVER FUNCTION TESTS: Recent Labs    11/19/20 1751 12/01/20 0610 12/03/20 0509 12/12/2020 0218  BILITOT 0.9 0.7 1.0 0.8  AST 28 23 23 27   ALT 22 15 17 17   ALKPHOS 59 50 50 42  PROT 6.6 5.7* 6.1* 5.9*  ALBUMIN 3.1* 2.7* 2.8* 2.6*    Assessment and Plan:  History of acute CVA s/p cerebral arteriogram with emergent mechanical thrombectomy of right ICA terminus and right MCA occlusions achieving a TICI 3 revascularization via right femoral approach 12/27/2020 by Dr. Estanislado Pandy. 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 Now also concern for thrombus to RUE  Electronically Signed: Ascencion Dike, PA-C 12/29/2020, 10:22 AM   I spent a total of 15 Minutes at the the patient's bedside AND on the patient's hospital floor or unit, greater than 50% of which was counseling/coordinating care for CVA s/p revascularization.

## 2020-12-26 NOTE — Progress Notes (Signed)
[facility] Progress Note Patient Name: Cesar [name] DOB: 08/03/1929 MRN: [id number]   Date of Service  12/04/2020  HPI/Events of Note  Patient with a cool, cyanotic right upper extremity with prolonged capillary refill > 3 seconds, per bedside RN there's a faintly palpable pulse.  eICU Interventions  Stat arterial doppler ultrasound of the extremity ordered, PCCM ground crew requested to evaluate the extremity.        Kerry Kass Braedyn Riggle 12/14/2020, 4:31 AM

## 2020-12-26 NOTE — Progress Notes (Addendum)
Paged Dr. Tamala Julian regarding HR sustaining >120 along w/ metoprolol.  Verbal order to start diltiazem and wean off cleviprex

## 2020-12-26 NOTE — Anesthesia Preprocedure Evaluation (Addendum)
Anesthesia Evaluation    Reviewed: Allergy & Precautions, Patient's Chart, lab work & pertinent test results, Unable to perform ROS - Chart review onlyPreop documentation limited or incomplete due to emergent nature of procedure.  Airway Mallampati: Intubated       Dental   Pulmonary sleep apnea , former smoker,       + intubated    Cardiovascular + Peripheral Vascular Disease and + DVT  + dysrhythmias Atrial Fibrillation + Valvular Problems/Murmurs MR   Echo 12/24/2020 1. Negative bubble study.  2. Left ventricular ejection fraction, by estimation, is 60 to 65%. The left ventricle has normal function. The left ventricle has no regional wall motion abnormalities. There is mild concentric left ventricular hypertrophy. Left ventricular diastolic function could not be evaluated.  3. Right ventricular systolic function is normal. The right ventricular size is normal. There is mildly elevated pulmonary artery systolic pressure. The estimated right ventricular systolic pressure is 33.2 mmHg.  4. Left atrial size was severely dilated.  5. Right atrial size was severely dilated.  6. The mitral valve is normal in structure. Mild mitral valve  regurgitation. No evidence of mitral stenosis. Severe mitral annular calcification.  7. The aortic valve has been repaired/replaced. Aortic valve regurgitation is not visualized. No aortic stenosis is present. There is a 29 mm Edwards Sapien prosthetic (TAVR) valve present in the aortic position. Procedure Date: 10/22/2020. Aortic valve mean gradient measures 16.0 mmHg.  8. The inferior vena cava is normal in size with greater than 50% respiratory variability, suggesting right atrial pressure of 3 mmHg.  9. Agitated saline contrast bubble study was negative, with no evidence of any interatrial shunt.    Echo 11/2020 1. Left ventricular ejection fraction, by estimation, is 60 to 65%. The left ventricle  has normal function. The left ventricle has no regional wall motion abnormalities. Left ventricular diastolic function could not be evaluated.  2. Right ventricular systolic function is normal. The right ventricular size is moderately enlarged.  3. Left atrial size was severely dilated.  4. The mitral valve is degenerative. Mild to moderate mitral valve regurgitation. No evidence of mitral stenosis. Severe mitral annular calcification.  5. The aortic valve has been repaired/replaced. Aortic valve regurgitation is trivial. No aortic stenosis is present. There is a 29 mm Sapien prosthetic (TAVR) valve present in the aortic position. Procedure Date: 10/22/2020. Aortic valve area, by VTI measures 2.36 cm. Aortic valve mean gradient measures 8.0 mmHg. Aortic valve Vmax measures 1.79 m/s.  6. Copmared to prior echo, TAVR remains stable with mean AVG 3mmHg (prior 65mmHg) and there is a trivial perivalvular leak. Essentially unchanged echo.    Neuro/Psych Seizures -,  PSYCHIATRIC DISORDERS Depression TIACVA, Residual Symptoms    GI/Hepatic negative GI ROS, Neg liver ROS,   Endo/Other  negative endocrine ROS  Renal/GU CRFRenal disease     Musculoskeletal negative musculoskeletal ROS (+)   Abdominal   Peds  Hematology  (+) anemia , HLD   Anesthesia Other Findings code stroke  Reproductive/Obstetrics                           Anesthesia Physical  Anesthesia Plan  ASA: IV and emergent  Anesthesia Plan: General   Post-op Pain Management:    Induction: Intravenous  PONV Risk Score and Plan: 2 and Ondansetron, Dexamethasone and Treatment may vary due to age or medical condition  Airway Management Planned: Oral ETT  Additional Equipment:   Intra-op Plan:  Post-operative Plan: Post-operative intubation/ventilation  Informed Consent:   Plan Discussed with:   Anesthesia Plan Comments:       Anesthesia Quick Evaluation

## 2020-12-26 NOTE — Op Note (Addendum)
Date: December 26, 2020  Preoperative diagnosis: Ischemic right upper extremity with suspected cardiac embolus  Postoperative diagnosis: Same  Procedure: Right upper extremity thrombectomy including the right radial, ulnar, brachial, and axillary artery  Surgeon: Dr. Marty Heck, MD  Assistant: Leontine Locket, PA  Indications: Patient is an 87 year old male that vascular surgery was consulted this morning with concern for acutely ischemic right upper extremity.  He has a very prolonged hospital course and ultimately had a TAVR and was discharged on anticoagulation for A. fib back in January.  This was complicated by mechanical fall with subdural hematomas anticoagulation was stopped in February.  He presented this hospitalization with bilateral lower extremity DVTs and also had ischemic stroke to the right ICA MCA region requiring thrombectomy in interventional radiology.  He now has no flow at the right wrist with duplex imaging showing right axillary and brachial artery thrombus.  He presents for right upper extremity thrombectomy after risk benefits discussed with family.  An assistant was needed for exposure and to expedite the case.  Findings: I cut down on the brachial artery bifurcation just below the antecubital fossa and dissected out the radial and ulnar artery.  The brachial artery was then opened transversely and I was able to pass a #3 Fogarty proximally and got 5 different pieces of thrombus and finally establish excellent pulsatile inflow.  I also passed #2 Fogarty down the radial and ulnar but did not retrieve any thrombus.  The arteriotomy was primarily closed and he had a palpable radial pulse at completion.  Anesthesia: Transported from the ICU intubated and general anesthesia  Details: Patient was taken to the operating room after informed consent was obtained.  Placed on the operative table supine position.  He was already intubated and once general anesthesia was  established we evaluated his right arm with ultrasound and the brachial artery was marked below the elbow as well as the bifurcation of the brachial artery where it bifurcated to the radial and ulnar artery.  Ultimately the right arm was then prepped and draped in usual sterile fashion.  A timeout was performed.  He was given preoperative antibiotics.  Made a longitudinal incision over the brachial artery below the elbow and dissected down opened the fascia and exposed the brachial artery as well as dissected out the radial and ulnar artery that were all controlled Vesseloops.  Patient was given another 4000 units of IV heparin and was transported to the OR with heparin running upstairs in the ICU.  We checked an ACT in the OR to ensure that it was greater than 250.  I then opened the brachial artery just above the bifurcation with 11 blade scalpel extended the arteriotomy with Potts scissors.  I passed a #3 Fogarty proximally at least 5 times getting thrombus on each pass until I finally had a clean pass and got pulsatile inflow.  I then passed a #2 Fogarty down the radial and ulnar once each and did not retrieve any thrombus and had good backbleeding.  Arteriotomy was closed with 6-0 Prolene interrupted fashion.  We did de-air the artery prior to completion.  Once we came off clamps we had an excellent radial pulse at the wrist that was palpable and a palpable brachial artery pulse in the incision.  The incision was closed with 3-0 Vicryl 4-0 Monocryl and Dermabond.  Complication: None  Condition: Critical    Marty Heck, MD [facility] Office: [place]

## 2020-12-26 NOTE — Consult Note (Signed)
Hospital Consult    Reason for Consult:  Ischemic right arm Referring Physician:  Critical Care MRN #:  [id number]  History of Present Illness: This is a 87 y.o. male with stage III CKD, severe aortic stenosis status post TAVR in January 2022, A. Fib, right ICA/MCA stroke that vascular surgery has been consulted for ischemic right upper extremity.  He apparently was discharged after his TAVR on Eliquis in January and then readmitted in February with large subdural hematoma after a fall and underwent a bur hole.  Most recently presented to the ED on 3/21 found to have UTI and bilateral lower extremity DVTs and also right ICA/MCA stroke.  He has had multiple thrombectomies with IR.  Apparently noted to have a cool right upper extremity this morning.  He remains intubated following his thrombectomy with IR yesterday.  Right upper extremity duplex was obtained today that showed right axillary artery occluded with partial thrombus in the proximal brachial artery and also partial occlusion of the right radial artery.  GCS is 3T and unable to evaluate neurologic exam.  Reported left arm weakness when he is awake.  Past Medical History:  Diagnosis Date  . AAA (abdominal aortic aneurysm) ([place])   . CKD (chronic kidney disease), stage III (HCC)    baseline Cr 1.4-1.5 in 2016-2017 by PCP)  . CVA (cerebral infarction)   . Depression   . DVT (deep venous thrombosis) ([place]) 2006  . H/O blood clots   . Hematuria   . HOH (hard of hearing)   . Hypercholesterolemia   . OSA (obstructive sleep apnea)    intol to CPAP  . Permanent atrial fibrillation ([place])   . Prostate enlargement   . Pulmonary nodules   . 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 Buena Irish and Dr. Roxy Manns  . Thoracic ascending aortic aneurysm ([place])    4.5cm in 11/2018, stable since 2016  . TIA (transient ischemic attack)     Past Surgical History:  Procedure  Laterality Date  . BURR HOLE Left 11/21/2020   Procedure: LEFT Side BURR HOLES,;  Surgeon: [name], MD;  Location: [facility];  Service: Neurosurgery;  Laterality: Left;  . EMBOLECTOMY Left 07/02/2016   Procedure: EMBOLECTOMY LEFT FEMORAL ARTERY;  Surgeon: [name], MD;  Location: [facility];  Service: Vascular;  Laterality: Left;  . IR CT HEAD LTD  12/15/2020  . IR PERCUTANEOUS ART THROMBECTOMY/INFUSION INTRACRANIAL INC DIAG ANGIO  12/12/2020  . MESENTERIC ARTERY BYPASS N/A 08/06/2019   Procedure: SUPERIOR MESENTERIC ARTERY THROMBECTOMY;  Surgeon: [name], MD;  Location: [facility];  Service: Vascular;  Laterality: N/A;  . NM [place]  08/24/2002   markedly positive,subtle lateral ischemia  . PATCH ANGIOPLASTY  08/06/2019   Procedure: Patch Angioplasty of superior messenteric artery with RIGHT femoral vein;  Surgeon: [name], MD;  Location: [facility];  Service: Vascular;;  . PROSTATE SURGERY    . RADIOLOGY WITH ANESTHESIA N/A 12/22/2020   Procedure: IR WITH ANESTHESIA;  Surgeon: Radiologist, Medication, MD;  Location: [facility];  Service: Radiology;  Laterality: N/A;  . RADIOLOGY WITH ANESTHESIA N/A 12/03/2020   Procedure: IR WITH ANESTHESIA - CODE STROKE;  Surgeon: Radiologist, Medication, MD;  Location: [facility];  Service: Radiology;  Laterality: N/A;  . RIGHT HEART CATH AND CORONARY ANGIOGRAPHY N/A 10/21/2020   Procedure: RIGHT HEART CATH AND CORONARY ANGIOGRAPHY;  Surgeon: [name],  MD;  Location: [facility];  Service: Cardiovascular;  Laterality: N/A;  . TEE WITHOUT CARDIOVERSION N/A 10/22/2020   Procedure: TRANSESOPHAGEAL ECHOCARDIOGRAM (TEE);  Surgeon: [name], MD;  Location: [facility];  Service: Open Heart Surgery;  Laterality: N/A;  . TRANSCATHETER AORTIC VALVE REPLACEMENT, TRANSFEMORAL Right 10/22/2020   Procedure: TRANSCATHETER AORTIC VALVE REPLACEMENT, TRANSFEMORAL;  Surgeon: [name], MD;  Location: [facility];   Service: Open Heart Surgery;  Laterality: Right;  . US ECHOCARDIOGRAPHY  01/06/2012   mod. AOV ca+,mild to mod. AS,mod mostly posterior MAC-mild MR    Allergies  Allergen Reactions  . Flomax [Tamsulosin Hcl] Other (See Comments)    Not able to urinate  . Levetiracetam Other (See Comments)    Unknown reaction    Prior to Admission medications   Medication Sig Start Date End Date Taking? Authorizing Provider  acetaminophen (TYLENOL) 325 MG tablet Take 2 tablets (650 mg total) by mouth every 6 (six) hours as needed for mild pain (or Fever >/= 101). 12/18/20  Yes Angiulli, Lavon Paganini, PA-C  aspirin EC 81 MG tablet Take 81 mg by mouth daily. Swallow whole.   Yes [provider]  atorvastatin (LIPITOR) 80 MG tablet Take 1 tablet (80 mg total) by mouth daily. 12/18/20  Yes Angiulli, Lavon Paganini, PA-C  b complex vitamins capsule Take 1 capsule by mouth daily.   Yes [provider]  divalproex (DEPAKOTE ER) 500 MG 24 hr tablet Take 2 tablets (1,000 mg total) by mouth at bedtime. 12/18/20  Yes Angiulli, Lavon Paganini, PA-C  docusate sodium (COLACE) 100 MG capsule Take 1 capsule (100 mg total) by mouth 2 (two) times daily. 12/18/20  Yes Angiulli, Lavon Paganini, PA-C  finasteride (PROSCAR) 5 MG tablet Take 1 tablet (5 mg total) by mouth daily. 12/18/20  Yes Angiulli, Lavon Paganini, PA-C  pantoprazole (PROTONIX) 40 MG tablet Take 40 mg by mouth every evening. 12/18/20  Yes [provider]    Social History   Socioeconomic History  . Marital status: Married    Spouse name: Not on file  . Number of children: 2  . Years of education: 6th grade  . Highest education level: Not on file  Occupational History  . Occupation: Retired - lives on 9 acres of land  Tobacco Use  . Smoking status: Former Smoker    Packs/day: 1.00    Years: 10.00    Pack years: 10.00    Types: Cigarettes    Quit date: 10/05/1952    Years since quitting: 68.2  . Smokeless tobacco: Never Used  Vaping Use  . Vaping Use:  Never used  Substance and Sexual Activity  . Alcohol use: Not Currently    Comment: occasional beer  . Drug use: Never  . Sexual activity: Never  Other Topics Concern  . Not on file  Social History Narrative   ** Merged History Encounter **       Lives at home with his wife. Left-handed. Caffeine use: 2-3 [employer] per day   Social Determinants of Health   Financial Resource Strain: Not on file  Food Insecurity: Not on file  Transportation Needs: No Transportation Needs  . Lack of Transportation (Medical): No  . Lack of Transportation (Non-Medical): No  Physical Activity: Not on file  Stress: Not on file  Social Connections: Not on file  Intimate Partner Violence: Not on file     Family History  Problem Relation Age of Onset  . Heart failure Mother   .  Diabetes Father   . Stroke Father     ROS: _0  Positive   _1  Negative   _2  All sytems reviewed and are negative  Cardiovascular: _3  chest pain/pressure _4  palpitations _5  SOB lying flat _6  DOE _7  pain in legs while walking _8  pain in legs at rest _9  pain in legs at night _10  non-healing ulcers _11  hx of DVT _12  swelling in legs  Pulmonary: _13  productive cough _14  asthma/wheezing _15  home O2  Neurologic: _16  weakness in _17  arms _18  legs _19  numbness in _20  arms _21  legs _22  hx of CVA _23  mini stroke _24 difficulty speaking or slurred speech _25  temporary loss of vision in one eye _26  dizziness  Hematologic: _27  hx of cancer _28  bleeding problems _29  problems with blood clotting easily  Endocrine:   _30  diabetes _31  thyroid disease  GI _32  vomiting blood _33  blood in stool  GU: _34  CKD/renal failure _35  HD--_36  M/W/F or _37  T/T/S _38  burning with urination _39  blood in urine  Psychiatric: _40  anxiety _41  depression  Musculoskeletal: _42  arthritis _43  joint pain  Integumentary: _44  rashes _45  ulcers  Constitutional: _46  fever _47  chills   Physical Examination  Vitals:   12/09/2020 0815 12/18/2020 0830  BP: 128/67  112/72  Pulse: (!) 129 96  Resp: (!) 25 18  Temp:    SpO2: 100% 100%   Body mass index is 29.63 kg/m.  General: Intubated, critically ill Gait: Not observed Pulmonary: Coarse breath sounds, ET tube in place Cardiac: irregular rhythm Abdomen: soft, NT/ND Vascular Exam/Pulses: Right upper extremity with a palpable subclavian pulse but no palpable brachial or radial pulse.  No right radial/ulnar signals at the wrist.  Very delayed capillary refill right hand. Left brachial and radial pulse palpable. Doppler signals in bilateral Dps brisk. Extremities: with ischemic changes right arm Neurologic: GCS 3 T.  Unable to assess neurologic exam.  CBC    Component Value Date/Time   WBC 7.3 12/05/2020 0522   RBC 2.99 (L) 01/02/2021 0522   HGB 10.0 (L) 12/28/2020 0522   HCT 29.3 (L) 12/17/2020 0522   PLT 142 (L) 12/04/2020 0522   MCV 98.0 12/20/2020 0522   MCH 33.4 12/21/2020 0522   MCHC 34.1 12/29/2020 0522   RDW 13.7 12/28/2020 0522   LYMPHSABS 0.2 (L) 01/01/2021 0522   MONOABS 0.8 12/22/2020 0522   EOSABS 0.1 12/09/2020 0522   BASOSABS 0.0 01/02/2021 0522    BMET    Component Value Date/Time   NA 138 12/13/2020 0522   NA 141 10/30/2020 1520   K 3.8 12/21/2020 0522   CL 109 12/04/2020 0522   CO2 20 (L) 12/15/2020 0522   GLUCOSE 117 (H) 12/09/2020 0522   BUN 18 12/22/2020 0522   BUN 28 (H) 10/30/2020 1520   CREATININE 1.41 (H) 12/12/2020 0522   CALCIUM 7.8 (L) 12/27/2020 0522   GFRNONAA 48 (L) 12/29/2020 0522   GFRAA 57 (L) 10/30/2020 1520    COAGS: Lab Results  Component Value Date   INR 1.3 (H) 11/19/2020   INR 1.1 10/22/2020   INR 6.1 (HH) 08/10/2019     Non-Invasive Vascular Imaging:    Left upper extremity arterial duplex shows occluded left axillary artery with partial thrombus in the proximal brachial and also some occlusion in the radial artery.  ASSESSMENT/PLAN: This is a 87 y.o. male that is critically ill and currently intubated in the neuro ICU in  the setting of chronic atrial fibrillation and unable to be anticoagulated recently due to SDH now  with multiple ischemic strokes requiring R ICA/MCA thrombectomy in IR that presents with an ischemic right arm.  I suspect he has embolized to his right arm from his atrial fibrillation.  Neurology has okayed starting anticoagulation.  I recommended right upper extremity thrombectomy in the OR after discussion with his daughter and wife.  Discussed this is a limb threatening situation without intervention.  They wish to proceed after risks benefits discussed.     Marty Heck, MD [facility] Office: [place]

## 2020-12-26 NOTE — Progress Notes (Signed)
  Echocardiogram 2D Echocardiogram has been performed.  Cesar [name] G Cesar [name] 12/15/2020, 10:05 AM

## 2020-12-26 NOTE — Procedures (Signed)
Extubation Procedure Note  Patient Details:   Name: Cesar [name] DOB: [birth year] MRN: [id number]   Airway Documentation:    Vent end date: 12/17/2020 Vent end time: 1610   Evaluation  O2 sats: stable throughout Complications: No apparent complications Patient did tolerate procedure well. Bilateral Breath Sounds: Diminished,Clear   No   Patient is following commands, but not speaking. Positive cuff leak noted.  No stridor noted. Patient placed on [place] 4 L with humidity.  Bayard Beaver 12/09/2020, 4:52 PM

## 2020-12-26 NOTE — Progress Notes (Signed)
ANTICOAGULATION CONSULT NOTE  Pharmacy Consult for heparin Indication: acute bilateral DVT, stroke  Heparin Dosing Weight: 91 kg  Labs: Recent Labs    12/24/20 0514 12/24/20 2112 12/17/2020 0523 12/08/2020 1339 12/27/2020 0522  HGB 9.9*  --  9.7*  --  10.0*  HCT 30.0*  --  29.8*  --  29.3*  PLT 126*  --  129*  --  142*  HEPARINUNFRC  --    < > 0.31 0.33 <0.10*  CREATININE 1.16  --  1.18  --  1.41*   < > = values in this interval not displayed.    Assessment: 35 yom s/p bur hole drainage of complex L chronic SDH last month presenting with acute CVA s/p IR for cerebral arteriogram, emergent mechanical thrombectomy on 3/21. Patient also found to have acute bilateral DVT. PTA apixababn has been held since 11/21/20 with SDH/crani with plan to hold x 6 weeks.  Neurosurgery cleared to begin heparin per stroke protocol on 3/22. Neurology and CCM in agreement. Patient did experience platelet drop to 30s in rehab on heparin SQ with improvement once d/c'd. HIT antibody resulted as negative. Per discussion with CCM, will cautiously start heparin and monitor plt trend for now. SDH appears to be almost fully resolved per Neurosurgery.  Patient went for revascularization 3/23 and heparin was held overnight. Orders this am to resume heparin drip.  Heparin level was therapeutic on heparin drip rate 1250 units/hr.   Goal of Therapy:  Heparin level 0.3-0.5 units/ml, no bolus per stroke protocol Monitor platelets by anticoagulation protocol: Yes   Plan:  Restart IV heparin at 1250 units/hr  Obtain HL in 8 hours Monitor daily HL and CBC  Alanda Slim, PharmD, [facility] Clinical Pharmacist Please see AMION for all Pharmacists' Contact Phone Numbers 12/04/2020, 8:24 AM

## 2020-12-26 NOTE — Progress Notes (Signed)
ANTICOAGULATION CONSULT NOTE  Pharmacy Consult for heparin Indication: acute bilateral DVT, stroke  Heparin Dosing Weight: 91 kg  Labs: Recent Labs    12/24/20 0514 12/24/20 2112 12/12/2020 0523 12/04/2020 1339 12/11/2020 0522 12/07/2020 1730  HGB 9.9*  --  9.7*  --  10.0*  --   HCT 30.0*  --  29.8*  --  29.3*  --   PLT 126*  --  129*  --  142*  --   HEPARINUNFRC  --    < > 0.31 0.33 <0.10* 0.62  CREATININE 1.16  --  1.18  --  1.41*  --    < > = values in this interval not displayed.    Assessment: 73 yom s/p bur hole drainage of complex L chronic SDH last month presenting with acute CVA s/p IR for cerebral arteriogram, emergent mechanical thrombectomy on 3/21. Patient also found to have acute bilateral DVT. PTA apixababn has been held since 11/21/20 with SDH/crani with plan to hold x 6 weeks.  Neurosurgery cleared to begin heparin per stroke protocol on 3/22. Neurology and CCM in agreement. Patient did experience platelet drop to 30s in rehab on heparin SQ with improvement once d/c'd. HIT antibody resulted as negative. Per discussion with CCM, will cautiously start heparin and monitor plt trend for now. SDH appears to be almost fully resolved per Neurosurgery.  Patient went for revascularization 3/23 and heparin was held overnight. Orders this am to resume heparin drip.  Heparin drip 1250 uts/hr was previously heparin level 0.3 at goal  Patient received bolus heparin in PR today during thrombectomy  Heparin level this evening slightly above goal at 0.6 Will drop drip rate slightly and re-check in am   Goal of Therapy:  Heparin level 0.3-0.5 units/ml, no bolus per stroke protocol Monitor platelets by anticoagulation protocol: Yes   Plan:  Decrease  IV heparin 1200 units/hr  Monitor daily HL and CBC   Bonnita Nasuti Pharm.D. CPP, BCPS Clinical Pharmacist [phone] 12/31/2020 7:41 PM    Please see AMION for all Pharmacists' Contact Phone Numbers 12/13/2020, 7:37 PM

## 2020-12-26 NOTE — Progress Notes (Signed)
Continue to hold heparin tonight. Per Dr. Erlinda Hong get an MRI tonight instead of the [facility] and will reassess if the heparin is able to be restarted in the AM.

## 2020-12-27 DIAGNOSIS — I4821 Permanent atrial fibrillation: Secondary | ICD-10-CM | POA: Diagnosis not present

## 2020-12-27 DIAGNOSIS — G9340 Encephalopathy, unspecified: Secondary | ICD-10-CM

## 2020-12-27 DIAGNOSIS — N179 Acute kidney failure, unspecified: Secondary | ICD-10-CM

## 2020-12-27 DIAGNOSIS — I6601 Occlusion and stenosis of right middle cerebral artery: Secondary | ICD-10-CM | POA: Diagnosis not present

## 2020-12-27 DIAGNOSIS — R569 Unspecified convulsions: Secondary | ICD-10-CM | POA: Diagnosis not present

## 2020-12-27 DIAGNOSIS — I63231 Cerebral infarction due to unspecified occlusion or stenosis of right carotid arteries: Secondary | ICD-10-CM | POA: Diagnosis not present

## 2020-12-27 DIAGNOSIS — J9601 Acute respiratory failure with hypoxia: Secondary | ICD-10-CM | POA: Diagnosis not present

## 2020-12-27 DIAGNOSIS — I63411 Cerebral infarction due to embolism of right middle cerebral artery: Secondary | ICD-10-CM | POA: Diagnosis not present

## 2020-12-27 DIAGNOSIS — E440 Moderate protein-calorie malnutrition: Secondary | ICD-10-CM | POA: Insufficient documentation

## 2020-12-27 DIAGNOSIS — N183 Chronic kidney disease, stage 3 unspecified: Secondary | ICD-10-CM

## 2020-12-27 LAB — CBC WITH DIFFERENTIAL/PLATELET
Abs Immature Granulocytes: 0.07 10*3/uL (ref 0.00–0.07)
Basophils Absolute: 0 10*3/uL (ref 0.0–0.1)
Basophils Relative: 0 %
Eosinophils Absolute: 0 10*3/uL (ref 0.0–0.5)
Eosinophils Relative: 0 %
HCT: 28.1 % — ABNORMAL LOW (ref 39.0–52.0)
Hemoglobin: 8.9 g/dL — ABNORMAL LOW (ref 13.0–17.0)
Immature Granulocytes: 1 %
Lymphocytes Relative: 8 %
Lymphs Abs: 0.6 10*3/uL — ABNORMAL LOW (ref 0.7–4.0)
MCH: 31.8 pg (ref 26.0–34.0)
MCHC: 31.7 g/dL (ref 30.0–36.0)
MCV: 100.4 fL — ABNORMAL HIGH (ref 80.0–100.0)
Monocytes Absolute: 1.6 10*3/uL — ABNORMAL HIGH (ref 0.1–1.0)
Monocytes Relative: 20 %
Neutro Abs: 5.5 10*3/uL (ref 1.7–7.7)
Neutrophils Relative %: 71 %
Platelets: 112 10*3/uL — ABNORMAL LOW (ref 150–400)
RBC: 2.8 MIL/uL — ABNORMAL LOW (ref 4.22–5.81)
RDW: 14.2 % (ref 11.5–15.5)
WBC: 7.9 10*3/uL (ref 4.0–10.5)
nRBC: 0 % (ref 0.0–0.2)

## 2020-12-27 LAB — GLUCOSE, CAPILLARY
Glucose-Capillary: 108 mg/dL — ABNORMAL HIGH (ref 70–99)
Glucose-Capillary: 121 mg/dL — ABNORMAL HIGH (ref 70–99)

## 2020-12-27 LAB — MAGNESIUM
Magnesium: 1.9 mg/dL (ref 1.7–2.4)
Magnesium: 1.8 mg/dL (ref 1.7–2.4)
Magnesium: 2.5 mg/dL — ABNORMAL HIGH (ref 1.7–2.4)

## 2020-12-27 LAB — POCT I-STAT 7, (LYTES, BLD GAS, ICA,H+H)
Acid-base deficit: 5 mmol/L — ABNORMAL HIGH (ref 0.0–2.0)
Bicarbonate: 19.7 mmol/L — ABNORMAL LOW (ref 20.0–28.0)
Calcium, Ion: 1.09 mmol/L — ABNORMAL LOW (ref 1.15–1.40)
HCT: 21 % — ABNORMAL LOW (ref 39.0–52.0)
Hemoglobin: 7.1 g/dL — ABNORMAL LOW (ref 13.0–17.0)
O2 Saturation: 100 %
Patient temperature: 97.8
Potassium: 3.5 mmol/L (ref 3.5–5.1)
Sodium: 141 mmol/L (ref 135–145)
TCO2: 21 mmol/L — ABNORMAL LOW (ref 22–32)
pCO2 arterial: 32.2 mmHg (ref 32.0–48.0)
pH, Arterial: 7.392 (ref 7.350–7.450)
pO2, Arterial: 176 mmHg — ABNORMAL HIGH (ref 83.0–108.0)

## 2020-12-27 LAB — VALPROIC ACID LEVEL: Valproic Acid Lvl: 54 ug/mL (ref 50.0–100.0)

## 2020-12-27 LAB — BASIC METABOLIC PANEL
Anion gap: 7 (ref 5–15)
BUN: 24 mg/dL — ABNORMAL HIGH (ref 8–23)
CO2: 21 mmol/L — ABNORMAL LOW (ref 22–32)
Calcium: 7.8 mg/dL — ABNORMAL LOW (ref 8.9–10.3)
Chloride: 112 mmol/L — ABNORMAL HIGH (ref 98–111)
Creatinine, Ser: 1.82 mg/dL — ABNORMAL HIGH (ref 0.61–1.24)
GFR, Estimated: 35 mL/min — ABNORMAL LOW (ref >60)
Glucose, Bld: 93 mg/dL (ref 70–99)
Potassium: 4.3 mmol/L (ref 3.5–5.1)
Sodium: 140 mmol/L (ref 135–145)

## 2020-12-27 LAB — PHOSPHORUS
Phosphorus: 4.2 mg/dL (ref 2.5–4.6)
Phosphorus: 3.3 mg/dL (ref 2.5–4.6)

## 2020-12-27 LAB — SURGICAL PATHOLOGY

## 2020-12-27 LAB — HEPARIN LEVEL (UNFRACTIONATED): Heparin Unfractionated: 0.59 IU/mL (ref 0.30–0.70)

## 2020-12-27 MED ORDER — MAGNESIUM SULFATE 2 GM/50ML IV SOLN
2.0000 g | Freq: Once | INTRAVENOUS | Status: AC
  Administered 2020-12-27: 2 g via INTRAVENOUS
  Filled 2020-12-27: qty 50

## 2020-12-27 MED ORDER — PIVOT 1.5 CAL PO LIQD
1000.0000 mL | ORAL | Status: DC
  Administered 2020-12-27 – 2020-12-30 (×2): 1000 mL

## 2020-12-27 MED ORDER — ADULT MULTIVITAMIN W/MINERALS CH
1.0000 | ORAL_TABLET | Freq: Every day | ORAL | Status: DC
  Administered 2020-12-27 – 2020-12-30 (×4): 1
  Filled 2020-12-27 (×4): qty 1

## 2020-12-27 MED ORDER — SODIUM CHLORIDE 3 % IN NEBU
4.0000 mL | INHALATION_SOLUTION | Freq: Every day | RESPIRATORY_TRACT | Status: AC
  Administered 2020-12-27: 4 mL via RESPIRATORY_TRACT
  Filled 2020-12-27: qty 4

## 2020-12-27 MED ORDER — VITAL HIGH PROTEIN PO LIQD: 1000.0000 mL | ORAL | Status: DC

## 2020-12-27 MED ORDER — PANTOPRAZOLE SODIUM 40 MG PO PACK
40.0000 mg | PACK | Freq: Every day | ORAL | Status: DC
  Administered 2020-12-28 – 2020-12-30 (×3): 40 mg
  Filled 2020-12-27 (×4): qty 20

## 2020-12-27 MED ORDER — ALBUMIN HUMAN 25 % IV SOLN
25.0000 g | Freq: Four times a day (QID) | INTRAVENOUS | Status: AC
  Administered 2020-12-27 – 2020-12-28 (×4): 25 g via INTRAVENOUS
  Filled 2020-12-27 (×4): qty 100

## 2020-12-27 MED ORDER — VALPROIC ACID 250 MG/5ML PO SOLN
500.0000 mg | Freq: Three times a day (TID) | ORAL | Status: DC
  Administered 2020-12-27 – 2020-12-30 (×9): 500 mg
  Filled 2020-12-27 (×9): qty 10

## 2020-12-27 MED ORDER — VALPROATE SODIUM 100 MG/ML IV SOLN
1500.0000 mg | Freq: Once | INTRAVENOUS | Status: AC
  Administered 2020-12-27: 1500 mg via INTRAVENOUS
  Filled 2020-12-27: qty 15

## 2020-12-27 MED ORDER — HEPARIN (PORCINE) 25000 UT/250ML-% IV SOLN
1100.0000 [IU]/h | INTRAVENOUS | Status: DC
  Administered 2020-12-28: 1100 [IU]/h via INTRAVENOUS
  Filled 2020-12-27 (×3): qty 250

## 2020-12-27 MED ORDER — ATORVASTATIN CALCIUM 80 MG PO TABS
80.0000 mg | ORAL_TABLET | Freq: Every day | ORAL | Status: DC
  Administered 2020-12-27 – 2020-12-30 (×4): 80 mg
  Filled 2020-12-27 (×4): qty 1

## 2020-12-27 MED ORDER — PROSOURCE TF PO LIQD
45.0000 mL | Freq: Two times a day (BID) | ORAL | Status: DC
  Administered 2020-12-27 – 2020-12-30 (×7): 45 mL
  Filled 2020-12-27 (×7): qty 45

## 2020-12-27 MED ORDER — FINASTERIDE 5 MG PO TABS: 5.0000 mg | ORAL_TABLET | Freq: Every day | ORAL | Status: DC

## 2020-12-27 NOTE — Progress Notes (Signed)
Inpatient Rehabilitation Admissions Coordinator  I await further participation with therapy before placing order for rehab consult.  Danne Baxter, RN, MSN Rehab Admissions Coordinator [phone] 12/27/2020 12:00 PM

## 2020-12-27 NOTE — Evaluation (Signed)
Clinical/Bedside Swallow Evaluation Patient Details  Name: Cesar [name] MRN: [id number] Date of Birth: 1929/01/01  Today's Date: 12/27/2020 Time: SLP Start Time (ACUTE ONLY): 1226 SLP Stop Time (ACUTE ONLY): 1246 SLP Time Calculation (min) (ACUTE ONLY): 20 min  Past Medical History:  Past Medical History:  Diagnosis Date  . AAA (abdominal aortic aneurysm) ([place])   . CKD (chronic kidney disease), stage III (H[facility])    baseline Cr 1.4-1.5 in 2016-2017 by PCP)  . CVA (cerebral infarction)   . Depression   . DVT (deep venous thrombosis) ([place]) 2006  . H/O blood clots   . Hematuria   . HOH (hard of hearing)   . Hypercholesterolemia   . OSA (obstructive sleep apnea)    intol to CPAP  . Permanent atrial fibrillation ([place])   . Prostate enlargement   . Pulmonary nodules   . 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 Buena Irish and Dr. Roxy Manns  . Thoracic ascending aortic aneurysm ([place])    4.5cm in 11/2018, stable since 2016  . TIA (transient ischemic attack)    Past Surgical History:  Past Surgical History:  Procedure Laterality Date  . BURR HOLE Left 11/21/2020   Procedure: LEFT Side BURR HOLES,;  Surgeon: [name], MD;  Location: [facility];  Service: Neurosurgery;  Laterality: Left;  . EMBOLECTOMY Left 07/02/2016   Procedure: EMBOLECTOMY LEFT FEMORAL ARTERY;  Surgeon: [name], MD;  Location: [facility];  Service: Vascular;  Laterality: Left;  . IR CT HEAD LTD  12/16/2020  . IR CT HEAD LTD  12/14/2020  . IR PERCUTANEOUS ART THROMBECTOMY/INFUSION INTRACRANIAL INC DIAG ANGIO  12/13/2020  . IR PERCUTANEOUS ART THROMBECTOMY/INFUSION INTRACRANIAL INC DIAG ANGIO  12/22/2020  . MESENTERIC ARTERY BYPASS N/A 08/06/2019   Procedure: SUPERIOR MESENTERIC ARTERY THROMBECTOMY;  Surgeon: [name], MD;  Location: [facility];  Service: Vascular;  Laterality: N/A;  . NM [place]  08/24/2002   markedly  positive,subtle lateral ischemia  . PATCH ANGIOPLASTY  08/06/2019   Procedure: Patch Angioplasty of superior messenteric artery with RIGHT femoral vein;  Surgeon: [name], MD;  Location: [name];  Service: Vascular;;  . PROSTATE SURGERY    . RADIOLOGY WITH ANESTHESIA N/A 12/06/2020   Procedure: IR WITH ANESTHESIA;  Surgeon: Radiologist, Medication, MD;  Location: [facility];  Service: Radiology;  Laterality: N/A;  . RADIOLOGY WITH ANESTHESIA N/A 12/19/2020   Procedure: IR WITH ANESTHESIA - CODE STROKE;  Surgeon: Radiologist, Medication, MD;  Location: [facility];  Service: Radiology;  Laterality: N/A;  . RIGHT HEART CATH AND CORONARY ANGIOGRAPHY N/A 10/21/2020   Procedure: RIGHT HEART CATH AND CORONARY ANGIOGRAPHY;  Surgeon: [name], MD;  Location: [facility];  Service: Cardiovascular;  Laterality: N/A;  . TEE WITHOUT CARDIOVERSION N/A 10/22/2020   Procedure: TRANSESOPHAGEAL ECHOCARDIOGRAM (TEE);  Surgeon: [name], MD;  Location: [facility];  Service: Open Heart Surgery;  Laterality: N/A;  . THROMBECTOMY BRACHIAL ARTERY Right 12/20/2020   Procedure: THROMBECTOMY RIGHT UPPER EXTREMITY;  Surgeon: Marty Heck, MD;  Location: [facility];  Service: Vascular;  Laterality: Right;  . TRANSCATHETER AORTIC VALVE REPLACEMENT, TRANSFEMORAL Right 10/22/2020   Procedure: TRANSCATHETER AORTIC VALVE REPLACEMENT, TRANSFEMORAL;  Surgeon: [name], MD;  Location: [facility];  Service: Open Heart Surgery;  Laterality: Right;  . US ECHOCARDIOGRAPHY  01/06/2012   mod. AOV ca+,mild to mod. AS,mod mostly posterior  MAC-mild MR   HPI:  87 y.o. recently admitted for TAVR and discharged and re-admitted 2/15 with large, chronic subdural hematoma with bur hole drainage with drain placement.  Discharged to inpatient rehab 2/28 then to home on 3/17. MBS with ST  recommended puree/thin (ill fitting dentures, no aspiration). Presented 3/21 with UTI, possible ICA occlusion became increasingly  confused and CTA showed occluded R ICA underwent emergent thrombectomy 3/21 and extubated 3/22.  BSE performed 3/22 without s/s aspiration and regular texture/thin recommended and ST signed off. Morning of 3/23, per RN, pt had significant event marked by sudden prolonged coughing with sausage (cut up by wife), made NPO and ST re-consulted. Repeat swallow eval 3/23 no clinical change in performance; he was downgraded to pureed/Dysphagia 1 due to drowsiness but was continued on thin liquids. Later that afternoon code stroke was called after pt became unresponsive; he underwent emergent thrombectomy, with successful revasularization of occluded RT MCA inf division.  Intubated for procedure. 3/24  Noted right axillary artery occlussion with partial thrombus; underwent RUE thrombectomy same day. Extubated 3/24 pm (24 hour intubation).  Cortrak 3/25. PMH significant for AAA, stage III CKD, OSA, seizures, OSA, severe AS s/p TAVR 10/22/20   Assessment / Plan / Recommendation Clinical Impression  Pt's lethargy impacted ability to fully participate in clinical swallow assessment.  Intermittently followed commands.  Cortrak placed this am.  He accepted several ice chips with verbal/tactile cues needed to attend to and masticate ice - tendency to hold material with eventual swallow palpated.  Water from teaspoon/straw offered, again requiring max verbal cues to attend and follow directions.  Pt held water orally requiring suctioning. Eventually he began coughing and a spontaneous swallow was observed.  Pt is too lethargic to begin a PO diet and is a high aspiration risk. Spoke with son and dtr at bedside.  For now, continue NPO status except occasional ice chips throughout day to help maintain integrity of oral mucosa and facilitate swallowing. SLP will f/u for readiness to resume a PO diet vs repeat instrumental swallow study. D/W RN. SLP Visit Diagnosis: Dysphagia, oropharyngeal phase (R13.12)    Aspiration Risk   Moderate aspiration risk    Diet Recommendation   NPO except ice chips after oral care       Other  Recommendations Oral Care Recommendations: Oral care prior to ice chip/H20   Follow up Recommendations Other (comment) (tba)      Frequency and Duration min 2x/week  2 weeks       Prognosis Prognosis for Safe Diet Advancement: Good Barriers to Reach Goals: Cognitive deficits      Swallow Study   General Date of Onset: 11/19/20 HPI: 87 y.o. recently admitted for TAVR and discharged and re-admitted 2/15 with large, chronic subdural hematoma with bur hole drainage with drain placement.  Discharged to inpatient rehab 2/28 then to home on 3/17. MBS with ST  recommended puree/thin (ill fitting dentures, no aspiration). Presented 3/21 with UTI, possible ICA occlusion became increasingly confused and CTA showed occluded R ICA underwent emergent thrombectomy 3/21 and extubated 3/22.  BSE performed 3/22 without s/s aspiration and regular texture/thin recommended and ST signed off. Morning of 3/23, per RN, pt had significant event marked by sudden prolonged coughing with sausage (cut up by wife), made NPO and ST re-consulted. Repeat swallow eval 3/23 no clinical change in performance; he was downgraded to pureed/Dysphagia 1 due to drowsiness but was continued on thin liquids. Later that afternoon code stroke was called after pt  became unresponsive; he underwent emergent thrombectomy, with successful revasularization of occluded RT MCA inf division.  Intubated for procedure. 3/24  Noted right axillary artery occlussion with partial thrombus; underwent RUE thrombectomy same day. Extubated 3/24 pm (24 hour intubation).  Cortrak 3/25. PMH significant for AAA, stage III CKD, OSA, seizures, OSA, severe AS s/p TAVR 10/22/20 Type of Study: Bedside Swallow Evaluation Previous Swallow Assessment: MBS 11/29/20 Diet Prior to this Study: NPO Temperature Spikes Noted: No Respiratory Status: Nasal cannula History of  Recent Intubation: Yes Length of Intubations (days): 1 days Date extubated: 12/14/2020 Behavior/Cognition: Lethargic/Drowsy Oral Cavity Assessment: Within Functional Limits Oral Care Completed by SLP: No Oral Cavity - Dentition: Dentures, top Self-Feeding Abilities: Total assist Patient Positioning: Upright in bed Baseline Vocal Quality: Normal Volitional Cough: Cognitively unable to elicit Volitional Swallow: Unable to elicit    Oral/Motor/Sensory Function Overall Oral Motor/Sensory Function: Mild impairment Facial Symmetry: Abnormal symmetry left;Suspected CN VII (facial) dysfunction Lingual ROM: Within Functional Limits   Ice Chips Ice chips: Impaired Oral Phase Impairments: Poor awareness of bolus Pharyngeal Phase Impairments: Suspected delayed Swallow   Thin Liquid Thin Liquid: Impaired Presentation: Spoon Oral Phase Functional Implications: Prolonged oral transit;Oral holding Pharyngeal  Phase Impairments: Cough - Immediate    Nectar Thick Nectar Thick Liquid: Not tested   Honey Thick Honey Thick Liquid: Not tested   Puree Puree: Not tested   Solid     Solid: Not tested     Amanda L. Tivis Ringer, [place] Office number [phone] Pager [phone]  Juan Quam Laurice 12/27/2020,12:51 PM

## 2020-12-27 NOTE — Progress Notes (Signed)
RUE thrombectomy incision continuously oozing blood w/out hematoma.  Dr. Stanford Breed paged and ordered pressure dressing and to page again if bleeding continues.

## 2020-12-27 NOTE — Progress Notes (Addendum)
ANTICOAGULATION CONSULT NOTE  Pharmacy Consult for heparin Indication: acute bilateral DVT, stroke  Heparin Dosing Weight: 91 kg  Labs: Recent Labs    12/29/2020 0523 01/01/2021 1339 01/02/2021 2034 12/22/2020 0522 12/23/2020 1730 12/27/20 0415  HGB 9.7*  --  7.1* 10.0*  --  8.9*  HCT 29.8*  --  21.0* 29.3*  --  28.1*  PLT 129*  --   --  142*  --  112*  HEPARINUNFRC 0.31   < >  --  <0.10* 0.62 0.59  CREATININE 1.18  --   --  1.41*  --  1.82*   < > = values in this interval not displayed.    Assessment: 82 yom s/p bur hole drainage of complex L chronic SDH last month presenting with acute CVA s/p IR for cerebral arteriogram, emergent mechanical thrombectomy on 3/21. Patient also found to have acute bilateral DVT. PTA apixababn has been held since 11/21/20 with SDH/crani with plan to hold x 6 weeks.  Neurosurgery cleared to begin heparin per stroke protocol on 3/22. Neurology and CCM in agreement. Patient did experience platelet drop to 30s in rehab on heparin SQ with improvement once d/c'd. HIT antibody resulted as negative. Per discussion with CCM, will cautiously start heparin and monitor plt trend for now. SDH appears to be almost fully resolved per Neurosurgery.  Patient went for revascularization 3/23 and heparin was held overnight. Orders 3/24 to resume heparin drip.  Heparin level remains above goal at 0.59 after rate decrease last night. Hg low but relatively stable 8.9, plt down to 112. Noted SCr trended up to 1.82 today. No major bleeding (does note oozing from thrombectomy incision but not significant - MD aware per note) or issues with infusion per discussion with RN.  Goal of Therapy:  Heparin level 0.3-0.5 units/ml, no bolus per stroke protocol Monitor platelets by anticoagulation protocol: Yes   Plan:  Decrease IV heparin to 1050 units/hr  Check 8hr heparin level Monitor daily CBC, s/sx bleeding   Arturo Morton, PharmD, BCPS Please check AMION for all [facility]  contact numbers Clinical Pharmacist 12/27/2020 7:53 AM

## 2020-12-27 NOTE — Procedures (Signed)
Cortrak  Person Inserting Tube:  Sanvi Ehler, RD Tube Type:  Cortrak - 43 inches Tube Location:  Right nare Initial Placement:  Stomach Secured by: Bridle Technique Used to Measure Tube Placement:  Documented cm marking at nare/ corner of mouth Cortrak Secured At:  67 cm   No x-ray is required. RN may begin using tube.   If the tube becomes dislodged please keep the tube and contact the Cortrak team at [email or web address] (password [email or web address]) for replacement.  If after hours and replacement cannot be delayed, place a NG tube and confirm placement with an abdominal x-ray.    Mariana Single RD, LDN Clinical Nutrition Pager listed in [place]

## 2020-12-27 NOTE — Progress Notes (Signed)
Dr. Stanford Breed at bedside, recommends keeping heparin paused for a total of 6 hours to stop bleeding at thrombectomy incision.  Stopped at 1240, will restart at [place].

## 2020-12-27 NOTE — Progress Notes (Signed)
Referring Physician(s): Code stroke - Kerney Elbe (neuro)  Supervising Physician: Luanne Bras  Patient Status:  [facility] - In-pt  Chief Complaint: Stroke f/u   Subjective:  History of acute CVA s/p cerebral arteriogram with emergent mechanical thrombectomy of right ICA terminus and right MCA occlusions achieving a TICI 3 revascularization via right femoral approach 12/16/2020 by Dr. Estanislado Pandy. Patient underwent right UE thrombectomy yesterday with Dr. Carlis Abbott, C.  Patient laying bed with his nurse at the bed side putting pressure at the puncture site for the right UE thrombectomy due to bleeding. He is extubated, appears fatigued but able to follow simple commands.  Can move all extremities.  Right femoral puncture site c/d/i.  Allergies: Flomax [tamsulosin hcl] and Levetiracetam  Medications: Prior to Admission medications   Medication Sig Start Date End Date Taking? Authorizing Provider  acetaminophen (TYLENOL) 325 MG tablet Take 2 tablets (650 mg total) by mouth every 6 (six) hours as needed for mild pain (or Fever >/= 101). 12/18/20  Yes Angiulli, Lavon Paganini, PA-C  aspirin EC 81 MG tablet Take 81 mg by mouth daily. Swallow whole.   Yes [provider]  atorvastatin (LIPITOR) 80 MG tablet Take 1 tablet (80 mg total) by mouth daily. 12/18/20  Yes Angiulli, Lavon Paganini, PA-C  b complex vitamins capsule Take 1 capsule by mouth daily.   Yes [provider]  divalproex (DEPAKOTE ER) 500 MG 24 hr tablet Take 2 tablets (1,000 mg total) by mouth at bedtime. 12/18/20  Yes Angiulli, Lavon Paganini, PA-C  docusate sodium (COLACE) 100 MG capsule Take 1 capsule (100 mg total) by mouth 2 (two) times daily. 12/18/20  Yes Angiulli, Lavon Paganini, PA-C  finasteride (PROSCAR) 5 MG tablet Take 1 tablet (5 mg total) by mouth daily. 12/18/20  Yes Angiulli, Lavon Paganini, PA-C  pantoprazole (PROTONIX) 40 MG tablet Take 40 mg by mouth every evening. 12/18/20  Yes [provider]     Vital  Signs: BP (!) 123/56   Pulse (!) 56   Temp 100 F (37.8 C) (Oral)   Resp 17   Ht 5\' 9"  (1.753 m)   Wt 200 lb 9.9 oz (91 kg)   SpO2 98%   BMI 29.63 kg/m   Physical Exam Vitals and nursing note reviewed.  Constitutional:      General: He is not in acute distress. Pulmonary:     Effort: Pulmonary effort is normal. No respiratory distress.  Skin:    General: Skin is warm and dry.     Comments: Right femoral puncture site soft without active bleeding or hematoma.  Neurological:     Mental Status: He is alert.     Comments: Alert, awake, and oriented x3. PERRL bilaterally. No facial asymmetry. Tongue midline. Can spontaneously move all extremities with left side weakness  Distal pulses (DPs) dopplerable bilaterally.  Speech dysarthric.   Imaging: CT ANGIO HEAD W OR WO CONTRAST  Result Date: 12/05/2020 CLINICAL DATA:  Stroke. EXAM: CT ANGIOGRAPHY HEAD AND NECK CT PERFUSION HEAD TECHNIQUE: Multidetector CT imaging of the head and neck was performed using the standard protocol during bolus administration of intravenous contrast. Multiplanar CT image reconstructions and MIPs were obtained to evaluate the vascular anatomy. Carotid stenosis measurements (when applicable) are obtained utilizing NASCET criteria, using the distal internal carotid diameter as the denominator. Multiphase CT imaging of the brain was performed following IV bolus contrast injection. Subsequent parametric perfusion maps were calculated using RAPID software. CONTRAST:  137mL OMNIPAQUE IOHEXOL 350 MG/ML SOLN  COMPARISON:  Cerebral angiogram and intervention December 23, 2020. FINDINGS: CTA NECK FINDINGS Aortic arch: Standard branching. Imaged portion shows no evidence of aneurysm or dissection. Calcified plaques in the aortic arch and at the origin of the major neck arteries without hemodynamically significant stenosis. Right carotid system: Atherosclerotic changes in the right carotid bifurcation with mixed density plaque  without hemodynamically significant stenosis. Left carotid system: Atherosclerotic changes of the left carotid bifurcation with mixed density plaque without hemodynamically significant stenosis. Increased tortuosity of the upper cervical segment the left ICA. Vertebral arteries: Dominant left vertebral artery is fat hypoplastic right vertebral artery. No evidence of dissection, stenosis (50% or greater) or occlusion. Skeleton: No acute or aggressive lesion identified. Other neck: Negative. Upper chest: Bilateral pleural effusion, right greater than left. Bilateral pleuroparenchymal scarring, centrilobular and paraseptal emphysema. Review of the MIP images confirms the above findings CTA HEAD FINDINGS Anterior circulation: Calcified plaques in the bilateral carotid siphons with mild stenosis of the paraclinoid segment on the right. There is a mid M1/MCA occlusion with poor distal opacification. Left MCA and bilateral ACA vascular tree are maintained. Posterior circulation: Calcified plaque in the V4 segment of the left vertebral artery without hemodynamically significant stenosis. Short segment occlusion of the right distal P2/PCA with adequate distal opacification. The basilar artery and left posterior cerebral arteries are maintained. Venous sinuses: As permitted by contrast timing, patent. Review of the MIP images confirms the above findings CT Brain Perfusion Findings: CBF (<30%) Volume: 91mL Perfusion (Tmax>6.0s) volume: 142mL Mismatch Volume: 164mL Infarction Location:Right MCA posterior division territory. IMPRESSION: 1. Right mid M1/MCA occlusion with poor distal opacification. 2. Short segment occlusion of the right distal P2/PCA with adequate distal opacification. 3. 24 mL right MCA posterior division territory core infarct and 123 mL right MCA posterior division territory penumbra. 4. Atherosclerotic changes of the bilateral carotid bifurcations without hemodynamically significant stenosis. 5. Bilateral  pleural effusion, right greater than left. 6. Emphysema and biapical pleuroparenchymal scarring. 7. Emphysema and aortic atherosclerosis. These results were transmitted via secured paging system at the time of interpretation on 12/31/2020 at 4:29 pm to provider [facility]. Aortic Atherosclerosis (ICD10-I70.0) and Emphysema (ICD10-J43.9). Electronically Signed   By: Pedro Earls M.D.   On: 01/02/2021 16:46   CT ANGIO HEAD W OR WO CONTRAST  Result Date: 12/18/2020 CLINICAL DATA:  Neuro deficit, acute, stroke suspected. EXAM: CT ANGIOGRAPHY HEAD AND NECK CT PERFUSION BRAIN TECHNIQUE: Multidetector CT imaging of the head and neck was performed using the standard protocol during bolus administration of intravenous contrast. Multiplanar CT image reconstructions and MIPs were obtained to evaluate the vascular anatomy. Carotid stenosis measurements (when applicable) are obtained utilizing NASCET criteria, using the distal internal carotid diameter as the denominator. Multiphase CT imaging of the brain was performed following IV bolus contrast injection. Subsequent parametric perfusion maps were calculated using RAPID software. CONTRAST:  121mL OMNIPAQUE IOHEXOL 350 MG/ML SOLN COMPARISON:  Noncontrast head CT performed earlier today 12/24/2020. Brain MRI performed earlier today 12/13/2020. MRA head 11/26/2020. FINDINGS: CTA NECK FINDINGS Aortic arch: Standard aortic branching atherosclerotic plaque within the visualized aortic arch and proximal major branch vessels of the neck. No hemodynamically significant innominate or proximal subclavian artery stenosis. Right carotid system: CCA patent within the neck to the bifurcation. Soft and calcified plaque within the carotid bifurcation and proximal ICA. There is occlusion or near occlusion of the cervical right ICA beginning at the proximal to mid aspect of this vessel. Left carotid system: CCA and ICA patent within  the neck. Soft and calcified plaque within  the carotid bifurcation and proximal ICA with less than 50% stenosis Vertebral arteries: The right vertebral artery is developmentally diminutive, but patent throughout the neck dominant left vertebral artery, patent within the neck. Moderate/severe stenosis at the origin of the left vertebral artery. Minimal calcified plaque is also present within the proximal V2 segment without stenosis at this site Skeleton: No acute bony abnormality or aggressive osseous lesion. Cervical spondylosis. Other neck: No neck mass or cervical lymphadenopathy. Thyroid unremarkable. Upper chest: No consolidation within the imaged lung apices. Biapical pleuroparenchymal scarring. Partially imaged right pleural effusion. Review of the MIP images confirms the above findings CTA HEAD FINDINGS Anterior circulation: The right ICA remains occluded or near occluded intracranially throughout the siphon region. There is reconstitution of enhancement within the vessel at the level of the supraclinoid segment. The right M1 middle cerebral arteries patent. High-grade focal stenosis within a proximal M2 inferior division branch (series 9, image 25). Atherosclerotic irregularity of additional proximal to mid right M2 MCA branch vessels. Most notably, there is segmental high-grade stenosis within a superior division mid M2 right MCA branch (series 10, image 14). The intracranial left ICA is patent. Calcified plaque within moderate stenosis of the supraclinoid segment. The left M1 middle cerebral artery is patent. No left M2 proximal branch occlusion or high-grade proximal stenosis is identified. The anterior cerebral arteries are patent. Posterior circulation: The intracranial vertebral arteries are patent. Mild calcified plaque within the intracranial left vertebral artery without stenosis. The posterior cerebral arteries are patent. A left posterior communicating artery is present. 2 mm medially projecting vascular protrusion arising from the  supraclinoid right ICA which may reflect an aneurysm, infundibulum or origin of an otherwise occluded right posterior communicating artery. Venous sinuses: Within the limitations of contrast timing, no convincing thrombus. Anatomic variants: As described Review of the MIP images confirms the above findings CT Brain Perfusion Findings: CBF (<30%) Volume: 73mL Perfusion (Tmax>6.0s) volume: 128mL Mismatch Volume: Infinite Infarction Location:None identified These results were called by telephone at the time of interpretation on 12/13/2020 at 5:32 pm to provider ERIC [facility] , who verbally acknowledged these results. IMPRESSION: CTA neck: 1. The cervical right ICA becomes occluded or near occluded at its proximal to mid aspect. The vessel remains occluded or near occluded throughout the remainder of the neck. 2. Left CCA and ICA patent within the neck with less than 50% stenosis. 3. Vertebral arteries patent within the neck bilaterally. 4. Moderate/severe stenosis at the origin of the dominant left vertebral artery. CTA head: 1. The right ICA remains occluded or near occluded intracranially throughout the siphon region. There is reconstitution of enhancement within this vessel at the level of the supraclinoid segment. 2. High-grade focal stenosis within an inferior division proximal M2 right MCA branch vessel. 3. Atherosclerotic irregularity of additional proximal to mid right M2 MCA branch vessels. Most notably, there is segmental high-grade stenosis within a superior division mid M2 right MCA branch. 4. 2 mm medially projecting vascular protrusion arising from the supraclinoid right ICA. This may reflect an aneurysm, infundibulum or the origin of an otherwise occluded right posterior communicating artery. CT perfusion head: The perfusion software identifies no core infarct. The small acute infarcts demonstrated on the brain MRI performed earlier today are not detected. The perfusion software identifies a 109 mL region  of hypoperfusion, predominantly within the right cerebral hemisphere. Electronically Signed   By: Kellie Simmering DO   On: 12/29/2020 17:48   CT HEAD  WO CONTRAST  Result Date: 12/22/2020 CLINICAL DATA:  Stroke follow-up EXAM: CT HEAD WITHOUT CONTRAST TECHNIQUE: Contiguous axial images were obtained from the base of the skull through the vertex without intravenous contrast. COMPARISON:  CT head 12/24/2020 FINDINGS: Brain: Intermediate density subdural hematoma on the left is unchanged measuring up to 6 mm in thickness. No new area of hemorrhage. No midline shift Generalized atrophy. Chronic ischemic changes throughout the white matter. Chronic infarcts in the frontal lobes bilaterally unchanged. Negative for acute infarct or mass. Vascular: Negative for hyperdense vessel Skull: 2 burr holes on the left for subdural drainage. No acute skeletal abnormality. Sinuses/Orbits: Mild mucosal edema paranasal sinuses. Bilateral cataract extraction Other: None IMPRESSION: No acute abnormality no change from yesterday Intermediate density 6 mm left subdural hematoma unchanged Atrophy and extensive chronic ischemic changes stable. Electronically Signed   By: Franchot Gallo M.D.   On: 12/12/2020 16:16   CT HEAD WO CONTRAST  Result Date: 12/24/2020 CLINICAL DATA:  87 year old male with right ICA occlusion/near occlusion. Tiny right hemisphere infarcts on MRI. Chronic/resolving left subdural hematoma. Status post neurovascular revascularization of the ICA and MCA yesterday. Subsequent encounter. EXAM: CT HEAD WITHOUT CONTRAST TECHNIQUE: Contiguous axial images were obtained from the base of the skull through the vertex without intravenous contrast. COMPARISON:  CT head 0730 hours yesterday. FINDINGS: Brain: Small residual mixed density left subdural hematoma remains stable. No new intracranial hemorrhage identified. Patchy bilateral MCA territory chronic encephalomalacia and extensive bilateral white matter hypodensity. Stable  gray-white matter differentiation since yesterday. No acute or evolving infarct identified by CT. Vascular: Calcified atherosclerosis at the skull base. No suspicious intracranial vascular hyperdensity. Skull: Stable.  Previous left frontal burr holes. Sinuses/Orbits: Visualized paranasal sinuses and mastoids are stable and well pneumatized. Other: Scalp and orbits soft tissues are stable. IMPRESSION: 1. Stable non contrast CT appearance of the brain since yesterday. Multifocal chronic ischemia with no acute or evolving infarct by CT. 2. No acute intracranial hemorrhage. Stable mixed density left subdural hematoma. Electronically Signed   By: Genevie Ann M.D.   On: 12/24/2020 08:18   CT ANGIO NECK W OR WO CONTRAST  Result Date: 12/12/2020 CLINICAL DATA:  Stroke. EXAM: CT ANGIOGRAPHY HEAD AND NECK CT PERFUSION HEAD TECHNIQUE: Multidetector CT imaging of the head and neck was performed using the standard protocol during bolus administration of intravenous contrast. Multiplanar CT image reconstructions and MIPs were obtained to evaluate the vascular anatomy. Carotid stenosis measurements (when applicable) are obtained utilizing NASCET criteria, using the distal internal carotid diameter as the denominator. Multiphase CT imaging of the brain was performed following IV bolus contrast injection. Subsequent parametric perfusion maps were calculated using RAPID software. CONTRAST:  165mL OMNIPAQUE IOHEXOL 350 MG/ML SOLN COMPARISON:  Cerebral angiogram and intervention December 23, 2020. FINDINGS: CTA NECK FINDINGS Aortic arch: Standard branching. Imaged portion shows no evidence of aneurysm or dissection. Calcified plaques in the aortic arch and at the origin of the major neck arteries without hemodynamically significant stenosis. Right carotid system: Atherosclerotic changes in the right carotid bifurcation with mixed density plaque without hemodynamically significant stenosis. Left carotid system: Atherosclerotic changes of  the left carotid bifurcation with mixed density plaque without hemodynamically significant stenosis. Increased tortuosity of the upper cervical segment the left ICA. Vertebral arteries: Dominant left vertebral artery is fat hypoplastic right vertebral artery. No evidence of dissection, stenosis (50% or greater) or occlusion. Skeleton: No acute or aggressive lesion identified. Other neck: Negative. Upper chest: Bilateral pleural effusion, right greater than left. Bilateral  pleuroparenchymal scarring, centrilobular and paraseptal emphysema. Review of the MIP images confirms the above findings CTA HEAD FINDINGS Anterior circulation: Calcified plaques in the bilateral carotid siphons with mild stenosis of the paraclinoid segment on the right. There is a mid M1/MCA occlusion with poor distal opacification. Left MCA and bilateral ACA vascular tree are maintained. Posterior circulation: Calcified plaque in the V4 segment of the left vertebral artery without hemodynamically significant stenosis. Short segment occlusion of the right distal P2/PCA with adequate distal opacification. The basilar artery and left posterior cerebral arteries are maintained. Venous sinuses: As permitted by contrast timing, patent. Review of the MIP images confirms the above findings CT Brain Perfusion Findings: CBF (<30%) Volume: 47mL Perfusion (Tmax>6.0s) volume: 150mL Mismatch Volume: 16mL Infarction Location:Right MCA posterior division territory. IMPRESSION: 1. Right mid M1/MCA occlusion with poor distal opacification. 2. Short segment occlusion of the right distal P2/PCA with adequate distal opacification. 3. 24 mL right MCA posterior division territory core infarct and 123 mL right MCA posterior division territory penumbra. 4. Atherosclerotic changes of the bilateral carotid bifurcations without hemodynamically significant stenosis. 5. Bilateral pleural effusion, right greater than left. 6. Emphysema and biapical pleuroparenchymal scarring.  7. Emphysema and aortic atherosclerosis. These results were transmitted via secured paging system at the time of interpretation on 12/29/2020 at 4:29 pm to provider [facility]. Aortic Atherosclerosis (ICD10-I70.0) and Emphysema (ICD10-J43.9). Electronically Signed   By: Pedro Earls M.D.   On: 12/29/2020 16:46   CT ANGIO NECK W OR WO CONTRAST  Result Date: 12/05/2020 CLINICAL DATA:  Neuro deficit, acute, stroke suspected. EXAM: CT ANGIOGRAPHY HEAD AND NECK CT PERFUSION BRAIN TECHNIQUE: Multidetector CT imaging of the head and neck was performed using the standard protocol during bolus administration of intravenous contrast. Multiplanar CT image reconstructions and MIPs were obtained to evaluate the vascular anatomy. Carotid stenosis measurements (when applicable) are obtained utilizing NASCET criteria, using the distal internal carotid diameter as the denominator. Multiphase CT imaging of the brain was performed following IV bolus contrast injection. Subsequent parametric perfusion maps were calculated using RAPID software. CONTRAST:  137mL OMNIPAQUE IOHEXOL 350 MG/ML SOLN COMPARISON:  Noncontrast head CT performed earlier today 12/24/2020. Brain MRI performed earlier today 12/14/2020. MRA head 11/26/2020. FINDINGS: CTA NECK FINDINGS Aortic arch: Standard aortic branching atherosclerotic plaque within the visualized aortic arch and proximal major branch vessels of the neck. No hemodynamically significant innominate or proximal subclavian artery stenosis. Right carotid system: CCA patent within the neck to the bifurcation. Soft and calcified plaque within the carotid bifurcation and proximal ICA. There is occlusion or near occlusion of the cervical right ICA beginning at the proximal to mid aspect of this vessel. Left carotid system: CCA and ICA patent within the neck. Soft and calcified plaque within the carotid bifurcation and proximal ICA with less than 50% stenosis Vertebral arteries: The  right vertebral artery is developmentally diminutive, but patent throughout the neck dominant left vertebral artery, patent within the neck. Moderate/severe stenosis at the origin of the left vertebral artery. Minimal calcified plaque is also present within the proximal V2 segment without stenosis at this site Skeleton: No acute bony abnormality or aggressive osseous lesion. Cervical spondylosis. Other neck: No neck mass or cervical lymphadenopathy. Thyroid unremarkable. Upper chest: No consolidation within the imaged lung apices. Biapical pleuroparenchymal scarring. Partially imaged right pleural effusion. Review of the MIP images confirms the above findings CTA HEAD FINDINGS Anterior circulation: The right ICA remains occluded or near occluded intracranially throughout the siphon region. There is  reconstitution of enhancement within the vessel at the level of the supraclinoid segment. The right M1 middle cerebral arteries patent. High-grade focal stenosis within a proximal M2 inferior division branch (series 9, image 25). Atherosclerotic irregularity of additional proximal to mid right M2 MCA branch vessels. Most notably, there is segmental high-grade stenosis within a superior division mid M2 right MCA branch (series 10, image 14). The intracranial left ICA is patent. Calcified plaque within moderate stenosis of the supraclinoid segment. The left M1 middle cerebral artery is patent. No left M2 proximal branch occlusion or high-grade proximal stenosis is identified. The anterior cerebral arteries are patent. Posterior circulation: The intracranial vertebral arteries are patent. Mild calcified plaque within the intracranial left vertebral artery without stenosis. The posterior cerebral arteries are patent. A left posterior communicating artery is present. 2 mm medially projecting vascular protrusion arising from the supraclinoid right ICA which may reflect an aneurysm, infundibulum or origin of an otherwise  occluded right posterior communicating artery. Venous sinuses: Within the limitations of contrast timing, no convincing thrombus. Anatomic variants: As described Review of the MIP images confirms the above findings CT Brain Perfusion Findings: CBF (<30%) Volume: 17mL Perfusion (Tmax>6.0s) volume: 172mL Mismatch Volume: Infinite Infarction Location:None identified These results were called by telephone at the time of interpretation on 12/19/2020 at 5:32 pm to provider ERIC [facility] , who verbally acknowledged these results. IMPRESSION: CTA neck: 1. The cervical right ICA becomes occluded or near occluded at its proximal to mid aspect. The vessel remains occluded or near occluded throughout the remainder of the neck. 2. Left CCA and ICA patent within the neck with less than 50% stenosis. 3. Vertebral arteries patent within the neck bilaterally. 4. Moderate/severe stenosis at the origin of the dominant left vertebral artery. CTA head: 1. The right ICA remains occluded or near occluded intracranially throughout the siphon region. There is reconstitution of enhancement within this vessel at the level of the supraclinoid segment. 2. High-grade focal stenosis within an inferior division proximal M2 right MCA branch vessel. 3. Atherosclerotic irregularity of additional proximal to mid right M2 MCA branch vessels. Most notably, there is segmental high-grade stenosis within a superior division mid M2 right MCA branch. 4. 2 mm medially projecting vascular protrusion arising from the supraclinoid right ICA. This may reflect an aneurysm, infundibulum or the origin of an otherwise occluded right posterior communicating artery. CT perfusion head: The perfusion software identifies no core infarct. The small acute infarcts demonstrated on the brain MRI performed earlier today are not detected. The perfusion software identifies a 109 mL region of hypoperfusion, predominantly within the right cerebral hemisphere. Electronically Signed    By: Kellie Simmering DO   On: 12/17/2020 17:48   MR ANGIO HEAD WO CONTRAST  Result Date: 12/29/2020 CLINICAL DATA:  Follow-up examination for acute stroke. EXAM: MRI HEAD WITHOUT CONTRAST MRA HEAD WITHOUT CONTRAST TECHNIQUE: Multiplanar, multiecho pulse sequences of the brain and surrounding structures were obtained without intravenous contrast. Angiographic images of the head were obtained using MRA technique without contrast. COMPARISON:  Prior studies from 02/24/2021 and earlier FINDINGS: MRI HEAD FINDINGS Brain: Age-related cerebral atrophy with chronic microvascular ischemic disease again noted. Remote cortical infarcts involving the bilateral frontal lobes noted. Associated chronic hemosiderin staining at the chronic right frontal infarct. Additional chronic infarct at the left caudate head. Since most recent MRI, there are multiple new cortical and subcortical infarcts involving the right cerebral hemisphere, primarily involving the right temporal lobe, consistent with previously identified right M1 occlusion. Patchy involvement  of the right basal ganglia noted. Additionally, there are a few new scattered subcentimeter ischemic infarcts involving the left cerebral hemisphere at the high left frontal and occipital lobes. No associated hemorrhage or mass effect. Continued interval evolution of recently identified bilateral cerebral infarcts noted. Subacute subdural hematoma again seen overlying the left frontal convexity measuring up to 6 mm in maximal thickness. Minimal mass effect on the underlying left cerebral hemisphere without midline shift. No new hemorrhage. No mass lesion or hydrocephalus. No new extra-axial fluid collection. Vascular: Major intracranial vascular flow voids are maintained. Skull and upper cervical spine: Degenerative thickening about the dens and tectorial membrane. Craniocervical junction otherwise unremarkable. Bone marrow signal intensity normal. Sequelae of prior left burr hole  craniotomy for subdural evacuation. Scalp soft tissues demonstrate no acute finding. Sinuses/Orbits: Prior bilateral ocular lens replacement with scleral banding on the right. Scattered mucosal thickening noted throughout the paranasal sinuses. Trace bilateral mastoid effusions, of doubtful significance. Other: None. MRA HEAD FINDINGS ANTERIOR CIRCULATION: Distal cervical segments of both internal carotid arteries are widely patent. Petrous, cavernous, and supraclinoid segments widely patent without stenosis or other abnormality. A1 segments widely patent. Normal anterior communicating artery complex. Anterior cerebral arteries widely patent to their distal aspects. Right M1 segment now widely patent status post revascularization. Left M1 remains patent. Distal MCA branches well perfused and symmetric. POSTERIOR CIRCULATION: Both vertebral arteries widely patent to the vertebrobasilar junction. Dominant left V4 segment with hypoplastic right vertebral artery. Patent left PICA. Right PICA not seen. Basilar widely patent to its distal aspect without stenosis. Superior cerebral arteries patent bilaterally. Both PCAs widely patent and well perfused to their distal aspects. Bilateral posterior communicating arteries noted, more prominent on the left. No intracranial aneurysm IMPRESSION: MRI HEAD IMPRESSION: 1. Interval development of multiple new acute ischemic infarcts involving the right cerebral hemisphere, primarily involving the right temporal lobe, consistent with recent right M1 occlusion. No associated hemorrhage or mass effect. 2. Continued interval evolution of additional recently identified small volume bilateral cerebral infarcts. 3. 6 mm subacute subdural hematoma overlying the left frontal convexity without significant mass effect, unchanged. 4. Underlying age-related cerebral atrophy with chronic microvascular ischemic disease and chronic bifrontal infarcts. MRA HEAD IMPRESSION: 1. Interval  revascularization of previously identified right M1 occlusion, with wide patency of the right MCA now seen. 2. Otherwise stable and normal intracranial MRA. Electronically Signed   By: Jeannine Boga M.D.   On: 12/05/2020 05:07   MR ANGIO HEAD WO CONTRAST  Result Date: 12/24/2020 CLINICAL DATA:  ICA occlusion post revascularization EXAM: MRA HEAD WITHOUT CONTRAST TECHNIQUE: Angiographic images of the Circle of Willis were obtained using MRA technique without intravenous contrast. COMPARISON:  None. FINDINGS: Motion artifact is present. Minimally included distal cervical right ICA and intracranial right ICA patent. Suspect moderate narrowing at the clinoid. Middle and anterior cerebral arteries are patent. Partially included intracranial vertebral arteries, basilar artery, posterior cerebral arteries are patent. IMPRESSION: Degraded by motion. Right ICA is now patent. Suspect moderate narrowing at the clinoid. Electronically Signed   By: Macy Mis M.D.   On: 12/24/2020 17:42   MR BRAIN WO CONTRAST  Result Date: 12/28/2020 CLINICAL DATA:  Follow-up examination for acute stroke. EXAM: MRI HEAD WITHOUT CONTRAST MRA HEAD WITHOUT CONTRAST TECHNIQUE: Multiplanar, multiecho pulse sequences of the brain and surrounding structures were obtained without intravenous contrast. Angiographic images of the head were obtained using MRA technique without contrast. COMPARISON:  Prior studies from 02/24/2021 and earlier FINDINGS: MRI HEAD FINDINGS Brain: Age-related cerebral  atrophy with chronic microvascular ischemic disease again noted. Remote cortical infarcts involving the bilateral frontal lobes noted. Associated chronic hemosiderin staining at the chronic right frontal infarct. Additional chronic infarct at the left caudate head. Since most recent MRI, there are multiple new cortical and subcortical infarcts involving the right cerebral hemisphere, primarily involving the right temporal lobe, consistent with  previously identified right M1 occlusion. Patchy involvement of the right basal ganglia noted. Additionally, there are a few new scattered subcentimeter ischemic infarcts involving the left cerebral hemisphere at the high left frontal and occipital lobes. No associated hemorrhage or mass effect. Continued interval evolution of recently identified bilateral cerebral infarcts noted. Subacute subdural hematoma again seen overlying the left frontal convexity measuring up to 6 mm in maximal thickness. Minimal mass effect on the underlying left cerebral hemisphere without midline shift. No new hemorrhage. No mass lesion or hydrocephalus. No new extra-axial fluid collection. Vascular: Major intracranial vascular flow voids are maintained. Skull and upper cervical spine: Degenerative thickening about the dens and tectorial membrane. Craniocervical junction otherwise unremarkable. Bone marrow signal intensity normal. Sequelae of prior left burr hole craniotomy for subdural evacuation. Scalp soft tissues demonstrate no acute finding. Sinuses/Orbits: Prior bilateral ocular lens replacement with scleral banding on the right. Scattered mucosal thickening noted throughout the paranasal sinuses. Trace bilateral mastoid effusions, of doubtful significance. Other: None. MRA HEAD FINDINGS ANTERIOR CIRCULATION: Distal cervical segments of both internal carotid arteries are widely patent. Petrous, cavernous, and supraclinoid segments widely patent without stenosis or other abnormality. A1 segments widely patent. Normal anterior communicating artery complex. Anterior cerebral arteries widely patent to their distal aspects. Right M1 segment now widely patent status post revascularization. Left M1 remains patent. Distal MCA branches well perfused and symmetric. POSTERIOR CIRCULATION: Both vertebral arteries widely patent to the vertebrobasilar junction. Dominant left V4 segment with hypoplastic right vertebral artery. Patent left PICA.  Right PICA not seen. Basilar widely patent to its distal aspect without stenosis. Superior cerebral arteries patent bilaterally. Both PCAs widely patent and well perfused to their distal aspects. Bilateral posterior communicating arteries noted, more prominent on the left. No intracranial aneurysm IMPRESSION: MRI HEAD IMPRESSION: 1. Interval development of multiple new acute ischemic infarcts involving the right cerebral hemisphere, primarily involving the right temporal lobe, consistent with recent right M1 occlusion. No associated hemorrhage or mass effect. 2. Continued interval evolution of additional recently identified small volume bilateral cerebral infarcts. 3. 6 mm subacute subdural hematoma overlying the left frontal convexity without significant mass effect, unchanged. 4. Underlying age-related cerebral atrophy with chronic microvascular ischemic disease and chronic bifrontal infarcts. MRA HEAD IMPRESSION: 1. Interval revascularization of previously identified right M1 occlusion, with wide patency of the right MCA now seen. 2. Otherwise stable and normal intracranial MRA. Electronically Signed   By: Jeannine Boga M.D.   On: 12/28/2020 05:07   MR BRAIN WO CONTRAST  Result Date: 12/13/2020 CLINICAL DATA:  Mental status change. Burr hole drainage of subdural hematoma 11/21/2020 EXAM: MRI HEAD WITHOUT CONTRAST TECHNIQUE: Multiplanar, multiecho pulse sequences of the brain and surrounding structures were obtained without intravenous contrast. COMPARISON:  MRI head 11/26/2020.  CT head 12/04/2020 FINDINGS: Brain: Multiple small areas of restricted diffusion compatible with acute infarcts involving the right basal ganglia, right parietal white matter, and right frontal parietal white matter, and right parietal cortex. These were not present on the MRI of 11/27/2019. Previous area of restricted diffusion in the left hippocampus has resolved likely due to recent infarct. Subacute subdural hematoma on the  left is unchanged measuring approximately 7 mm in thickness. Ventricle size normal. No midline shift There is generalized atrophy. Moderate white matter changes bilaterally most likely chronic microvascular ischemia Motion degraded study.  Patient terminated the procedure early Vascular: Loss of flow void in the right internal carotid artery compatible with occlusion. This was patent on the prior MRI and MRA. Skull and upper cervical spine: No focal skeletal abnormality. Sinuses/Orbits: Paranasal sinuses clear. Bilateral cataract extraction. Other: None IMPRESSION: Multiple small areas of acute infarct in the right cerebral hemisphere. Interval resolution of small infarct in the left hippocampus. Loss of flow void right internal carotid artery compatible with interval occlusion Subacute subdural hematoma left 7 mm, unchanged. These results were called by telephone at the time of interpretation on 12/28/2020 at 4:26 pm to provider [facility] , who verbally acknowledged these results. Electronically Signed   By: Franchot Gallo M.D.   On: 12/27/2020 16:27   IR CT Head Ltd  Result Date: 12/27/2020 INDICATION: New onset of left-sided weakness, right gaze deviation. Occluded prominent inferior division of the right middle cerebral artery on CT angiogram with a prominent penumbra on CT perfusion maps. EXAM: 1. EMERGENT LARGE VESSEL OCCLUSION THROMBOLYSIS (anterior CIRCULATION) COMPARISON:  CT angiogram of the head and neck of December 25, 2020. MEDICATIONS: MEDICATIONS Ancef 2 g IV antibiotic was administered within 1 hour of the procedure. ANESTHESIA/SEDATION: General anesthesia CONTRAST:  Isovue 300 approximately 85 mL. FLUOROSCOPY TIME:  Fluoroscopy Time: 67 minutes 54 seconds (1502 mGy). COMPLICATIONS: None immediate. TECHNIQUE: Following a full explanation of the procedure along with the potential associated complications, an informed witnessed consent was obtained. The risks of intracranial hemorrhage of 10%,  worsening neurological deficit, ventilator dependency, death and inability to revascularize were all reviewed in detail with the patient's daughter. The patient was then put under general anesthesia by the Department of Anesthesiology at [facility] The right groin was prepped and draped in the usual sterile fashion. Thereafter using modified Seldinger technique, transfemoral access into the right common femoral artery was obtained without difficulty. Over a 0.035 inch guidewire an 8 French 5 Pinnacle sheath was inserted. Through this, and also over a 0.035 inch guidewire a 5 French Simmons 2 support catheter within the balloon guide catheter was advanced to the carotid arteries. The Simmons catheter was formed in the ascending aorta. Select cannulation was then performed of the right common carotid artery over an 035 inch glide wire. The balloon guide was advanced to the bulb of the right internal carotid artery. The guidewire was removed. Good aspiration obtained from the hub of the balloon guide catheter. Gentle control arteriogram performed through this demonstrated patency of the right internal carotid artery to the cranial skull base. Extensive tortuosity extra cranially and intracranially of the right internal carotid artery remains stable associated with mild fusiform dilatation seen in the cavernous segment. Patency of the right internal carotid artery supraclinoid segment was maintained. Right middle cerebral artery opacified into the capillary and venous phases with occluded dominant inferior division at its origin. FINDINGS: See above. PROCEDURE: Through the balloon guide catheter in the right internal carotid artery bulb, a combination of a 55 136 cm Zoom aspiration catheter with an 021 162 cm Trevo ProVue microcatheter was advanced initially over an 016 Fathom micro guidewire to the supraclinoid right ICA and then this was exchanged for an 014 inch Synchro support micro guidewire. Using a  torque device, access into the occluded dominant inferior division was obtained with the micro guidewire  which was advanced to the M2 M3 region followed by the microcatheter. At this time the Zoom 55 aspiration catheter was advanced and positioned at the origin of the inferior division. A 6.5 mm x 45 mm Embotrap retrieval device was advanced to the distal end of the microcatheter and deployed by unsheathing the microcatheter. With proximal flow arrest at the bulb of the right internal carotid artery, constant aspiration was applied at the hub of the 55 Zoom aspiration catheter, and with a 20 mL syringe at the hub of the balloon guide catheter for approximately 2 minutes. Thereafter, the combination of the retrieval device, the microcatheter, and the Zoom aspiration catheter was retrieved and removed. Flow arrest was reversed. A control arteriogram performed through the balloon guide catheter into the bulb of the right internal carotid artery now demonstrated complete revascularization of the previously occluded inferior division achieving a TICI 3 revascularization in the right MCA distribution. The right anterior cerebral artery remained widely patent into the capillary and venous phases. The balloon guide was retrieved and removed. The 8 French Pinnacle sheath was replaced with an 8 French Angio-Seal closure device with hemostasis. Distal pulses were Dopplerable in both feet unchanged from prior to the procedure. A CT scan of the brain on the table demonstrated no evidence of mass effect, midline shift or of intracranial hemorrhage, The patient was left intubated and returned to neuro ICU in stable condition. IMPRESSION: Status post endovascular complete revascularization of occluded inferior division of the right middle cerebral artery with a 6.5 mm x 45 mm Embotrap retrieval device and proximal aspiration achieving a TICI 3 revascularization. PLAN: Follow-up in the clinic approximately 4 weeks post discharge.  Electronically Signed   By: Luanne Bras M.D.   On: 12/14/2020 15:05   IR CT Head Ltd  Result Date: 12/14/2020 INDICATION: New onset slurred speech, confusion and left-sided weakness and right gaze preference. EXAM: 1. EMERGENT LARGE VESSEL OCCLUSION THROMBOLYSIS (anterior CIRCULATION) COMPARISON:  MRI scan 12/07/2020, and CT angiogram of the head and neck of 12/08/2020. MEDICATIONS: Ancef 2 g IV antibiotic was administered within 1 hour of the procedure. ANESTHESIA/SEDATION: General anesthesia CONTRAST:  Isovue 300 50 mL FLUOROSCOPY TIME:  Fluoroscopy Time: 45 minutes 36 seconds (1333 mGy). COMPLICATIONS: None immediate. TECHNIQUE: Following a full explanation of the procedure along with the potential associated complications, an informed witnessed consent was obtained. The risks of intracranial hemorrhage of 10%, worsening neurological deficit, ventilator dependency, death and inability to revascularize were all reviewed in detail with the patient's family. The patient was then put under general anesthesia by the Department of Anesthesiology at [facility]. The right groin was prepped and draped in the usual sterile fashion. Thereafter using modified Seldinger technique, transfemoral access into the right common femoral artery was obtained without difficulty. Over a 0.035 inch guidewire an 8 French 25 cm Pinnacle sheath was inserted. Through this, and also over a 0.035 inch guidewire a 5 French Simmons 2 support catheter inside an 087 balloon guide catheter was advanced to the aortic arch region and selectively positioned in the right common carotid artery at the bifurcation. The support Simmons 2 and catheter, and the guidewire were removed. Good aspiration obtained from the hub of the balloon guide catheter at the right common carotid bifurcation. FINDINGS: A gentle control arteriogram performed through the balloon guide catheter demonstrated right external carotid artery and its branches to be  widely patent. The right internal carotid artery at the bulb and just distally demonstrates complete angiographic occlusion.  No evidence of a delayed string sign noted. No distal reconstitution of the right middle cerebral artery from the external carotid artery branches was noted either. PROCEDURE: Through the balloon guide catheter, over a 016 inch Fathom micro guidewire a 162 cm 021 Trevo ProVue microcatheter inside an 071 130 cm Zoom aspiration catheter combination was advanced gingerly, slowly and deliberately into the right middle cerebral artery inferior division M2 segment followed by the microcatheter. Zoom aspiration catheter was advanced into the mid M1 segment. A 6.5 mm x 45 mm Embotrap retrieval device was then advanced to the distal end of the microcatheter. This was then deployed by retrieving the microcatheter which was then retrieved further into the right cervical ICA. Thereafter, with proximal flow arrest in the right internal carotid artery with the balloon guide catheter, and constant aspiration at the hub of the balloon guide catheter with a 20 mL syringe, and with Penumbra aspiration device at the hub 071 Zoom aspiration catheter for approximately 2 minutes, the combination was retrieved and removed. Following reversal of flow arrest, a control arteriogram performed through the balloon guide in the right common carotid artery demonstrated complete revascularization of the right middle cerebral artery and the right anterior cerebral artery distributions. No evidence of irregularities or of intimal flaps or filling defects were seen within the internal carotid artery extra cranially or intracranially. Extensive clots were seen in the canister of the aspiration device. Few clots were seen entangled in the retrieval device itself. Throughout the procedure the patient's blood pressure and neurological status remained stable. Balloon guide was removed. The 8 French neurovascular sheath was removed  and replaced with an 8 French Angio-Seal closure device for hemostasis. The right groin appeared soft without evidence of hemorrhage. Distal pulses remained Dopplerable in both lower extremities. Post CT of the brain demonstrated no evidence of intracranial hemorrhage, mass effect or midline shift. Patient was left intubated to protect the airway. Patient was then transferred to neuro ICU for post thrombectomy management. IMPRESSION: Status post endovascular complete revascularization of occluded internal carotid artery extra cranially and intracranially, and of the right middle cerebral artery with 1 pass with 6.5 mm x 45 mm Embotrap retrieval device with aspiration and proximal flow arrest achieving a TICI 3 revascularization. PLAN: Follow-up in the clinic 4 weeks post discharge p.r.n. Electronically Signed   By: Luanne Bras M.D.   On: 12/24/2020 15:12   CT CEREBRAL PERFUSION W CONTRAST  Result Date: 12/13/2020 CLINICAL DATA:  Stroke. EXAM: CT ANGIOGRAPHY HEAD AND NECK CT PERFUSION HEAD TECHNIQUE: Multidetector CT imaging of the head and neck was performed using the standard protocol during bolus administration of intravenous contrast. Multiplanar CT image reconstructions and MIPs were obtained to evaluate the vascular anatomy. Carotid stenosis measurements (when applicable) are obtained utilizing NASCET criteria, using the distal internal carotid diameter as the denominator. Multiphase CT imaging of the brain was performed following IV bolus contrast injection. Subsequent parametric perfusion maps were calculated using RAPID software. CONTRAST:  170mL OMNIPAQUE IOHEXOL 350 MG/ML SOLN COMPARISON:  Cerebral angiogram and intervention December 23, 2020. FINDINGS: CTA NECK FINDINGS Aortic arch: Standard branching. Imaged portion shows no evidence of aneurysm or dissection. Calcified plaques in the aortic arch and at the origin of the major neck arteries without hemodynamically significant stenosis. Right  carotid system: Atherosclerotic changes in the right carotid bifurcation with mixed density plaque without hemodynamically significant stenosis. Left carotid system: Atherosclerotic changes of the left carotid bifurcation with mixed density plaque without hemodynamically significant stenosis. Increased  tortuosity of the upper cervical segment the left ICA. Vertebral arteries: Dominant left vertebral artery is fat hypoplastic right vertebral artery. No evidence of dissection, stenosis (50% or greater) or occlusion. Skeleton: No acute or aggressive lesion identified. Other neck: Negative. Upper chest: Bilateral pleural effusion, right greater than left. Bilateral pleuroparenchymal scarring, centrilobular and paraseptal emphysema. Review of the MIP images confirms the above findings CTA HEAD FINDINGS Anterior circulation: Calcified plaques in the bilateral carotid siphons with mild stenosis of the paraclinoid segment on the right. There is a mid M1/MCA occlusion with poor distal opacification. Left MCA and bilateral ACA vascular tree are maintained. Posterior circulation: Calcified plaque in the V4 segment of the left vertebral artery without hemodynamically significant stenosis. Short segment occlusion of the right distal P2/PCA with adequate distal opacification. The basilar artery and left posterior cerebral arteries are maintained. Venous sinuses: As permitted by contrast timing, patent. Review of the MIP images confirms the above findings CT Brain Perfusion Findings: CBF (<30%) Volume: 46mL Perfusion (Tmax>6.0s) volume: 154mL Mismatch Volume: 120mL Infarction Location:Right MCA posterior division territory. IMPRESSION: 1. Right mid M1/MCA occlusion with poor distal opacification. 2. Short segment occlusion of the right distal P2/PCA with adequate distal opacification. 3. 24 mL right MCA posterior division territory core infarct and 123 mL right MCA posterior division territory penumbra. 4. Atherosclerotic changes  of the bilateral carotid bifurcations without hemodynamically significant stenosis. 5. Bilateral pleural effusion, right greater than left. 6. Emphysema and biapical pleuroparenchymal scarring. 7. Emphysema and aortic atherosclerosis. These results were transmitted via secured paging system at the time of interpretation on 12/31/2020 at 4:29 pm to provider [facility]. Aortic Atherosclerosis (ICD10-I70.0) and Emphysema (ICD10-J43.9). Electronically Signed   By: Pedro Earls M.D.   On: 12/24/2020 16:46   CT CEREBRAL PERFUSION W CONTRAST  Result Date: 12/19/2020 CLINICAL DATA:  Neuro deficit, acute, stroke suspected. EXAM: CT ANGIOGRAPHY HEAD AND NECK CT PERFUSION BRAIN TECHNIQUE: Multidetector CT imaging of the head and neck was performed using the standard protocol during bolus administration of intravenous contrast. Multiplanar CT image reconstructions and MIPs were obtained to evaluate the vascular anatomy. Carotid stenosis measurements (when applicable) are obtained utilizing NASCET criteria, using the distal internal carotid diameter as the denominator. Multiphase CT imaging of the brain was performed following IV bolus contrast injection. Subsequent parametric perfusion maps were calculated using RAPID software. CONTRAST:  174mL OMNIPAQUE IOHEXOL 350 MG/ML SOLN COMPARISON:  Noncontrast head CT performed earlier today 12/24/2020. Brain MRI performed earlier today 12/24/2020. MRA head 11/26/2020. FINDINGS: CTA NECK FINDINGS Aortic arch: Standard aortic branching atherosclerotic plaque within the visualized aortic arch and proximal major branch vessels of the neck. No hemodynamically significant innominate or proximal subclavian artery stenosis. Right carotid system: CCA patent within the neck to the bifurcation. Soft and calcified plaque within the carotid bifurcation and proximal ICA. There is occlusion or near occlusion of the cervical right ICA beginning at the proximal to mid aspect of this  vessel. Left carotid system: CCA and ICA patent within the neck. Soft and calcified plaque within the carotid bifurcation and proximal ICA with less than 50% stenosis Vertebral arteries: The right vertebral artery is developmentally diminutive, but patent throughout the neck dominant left vertebral artery, patent within the neck. Moderate/severe stenosis at the origin of the left vertebral artery. Minimal calcified plaque is also present within the proximal V2 segment without stenosis at this site Skeleton: No acute bony abnormality or aggressive osseous lesion. Cervical spondylosis. Other neck: No neck mass or cervical  lymphadenopathy. Thyroid unremarkable. Upper chest: No consolidation within the imaged lung apices. Biapical pleuroparenchymal scarring. Partially imaged right pleural effusion. Review of the MIP images confirms the above findings CTA HEAD FINDINGS Anterior circulation: The right ICA remains occluded or near occluded intracranially throughout the siphon region. There is reconstitution of enhancement within the vessel at the level of the supraclinoid segment. The right M1 middle cerebral arteries patent. High-grade focal stenosis within a proximal M2 inferior division branch (series 9, image 25). Atherosclerotic irregularity of additional proximal to mid right M2 MCA branch vessels. Most notably, there is segmental high-grade stenosis within a superior division mid M2 right MCA branch (series 10, image 14). The intracranial left ICA is patent. Calcified plaque within moderate stenosis of the supraclinoid segment. The left M1 middle cerebral artery is patent. No left M2 proximal branch occlusion or high-grade proximal stenosis is identified. The anterior cerebral arteries are patent. Posterior circulation: The intracranial vertebral arteries are patent. Mild calcified plaque within the intracranial left vertebral artery without stenosis. The posterior cerebral arteries are patent. A left posterior  communicating artery is present. 2 mm medially projecting vascular protrusion arising from the supraclinoid right ICA which may reflect an aneurysm, infundibulum or origin of an otherwise occluded right posterior communicating artery. Venous sinuses: Within the limitations of contrast timing, no convincing thrombus. Anatomic variants: As described Review of the MIP images confirms the above findings CT Brain Perfusion Findings: CBF (<30%) Volume: 43mL Perfusion (Tmax>6.0s) volume: 13mL Mismatch Volume: Infinite Infarction Location:None identified These results were called by telephone at the time of interpretation on 12/04/2020 at 5:32 pm to provider [facility] , who verbally acknowledged these results. IMPRESSION: CTA neck: 1. The cervical right ICA becomes occluded or near occluded at its proximal to mid aspect. The vessel remains occluded or near occluded throughout the remainder of the neck. 2. Left CCA and ICA patent within the neck with less than 50% stenosis. 3. Vertebral arteries patent within the neck bilaterally. 4. Moderate/severe stenosis at the origin of the dominant left vertebral artery. CTA head: 1. The right ICA remains occluded or near occluded intracranially throughout the siphon region. There is reconstitution of enhancement within this vessel at the level of the supraclinoid segment. 2. High-grade focal stenosis within an inferior division proximal M2 right MCA branch vessel. 3. Atherosclerotic irregularity of additional proximal to mid right M2 MCA branch vessels. Most notably, there is segmental high-grade stenosis within a superior division mid M2 right MCA branch. 4. 2 mm medially projecting vascular protrusion arising from the supraclinoid right ICA. This may reflect an aneurysm, infundibulum or the origin of an otherwise occluded right posterior communicating artery. CT perfusion head: The perfusion software identifies no core infarct. The small acute infarcts demonstrated on the brain  MRI performed earlier today are not detected. The perfusion software identifies a 109 mL region of hypoperfusion, predominantly within the right cerebral hemisphere. Electronically Signed   By: Kellie Simmering DO   On: 12/17/2020 17:48   DG CHEST PORT 1 VIEW  Result Date: 12/04/2020 CLINICAL DATA:  87 year old male status post intubation. EXAM: PORTABLE CHEST 1 VIEW COMPARISON:  Chest radiograph dated 12/05/2020. FINDINGS: Endotracheal tube with tip approximately 8 cm above the carina. Left lung base streaky atelectasis/scarring. No new consolidation. There is no pleural effusion pneumothorax. Stable cardiomegaly. Aortic valve repair. Atherosclerotic calcification of the aorta. No acute osseous pathology. Degenerative changes of the spine. IMPRESSION: 1. Endotracheal tube above the carina. 2. No consolidation. Electronically Signed   By:  Anner Crete M.D.   On: 12/10/2020 20:52   DG CHEST PORT 1 VIEW  Result Date: 12/18/2020 CLINICAL DATA:  Intubation EXAM: PORTABLE CHEST 1 VIEW COMPARISON:  None. FINDINGS: Endotracheal tube is 7 cm above the carina. NG tube enters the stomach, the tip projecting off the lower aspect of the image. Heart is borderline in size. Prior TAVR. Mitral valve annular calcifications. No confluent airspace opacities or effusions. IMPRESSION: Endotracheal tube 7 cm above the carina. No acute cardiopulmonary disease. Electronically Signed   By: Rolm Baptise M.D.   On: 12/04/2020 22:04   DG Abd Portable 1V  Result Date: 12/17/2020 CLINICAL DATA:  OG tube placement. EXAM: PORTABLE ABDOMEN - 1 VIEW COMPARISON:  08/06/2019. FINDINGS: OG tube noted with tip in side hole in the upper stomach. Advancement of approximately 6 cm should be considered. No gastric or bowel distention. Degenerative changes lumbar spine and both hips. IMPRESSION: OG tube tip and side hole in the upper stomach. Advancement of approximately 6 cm should be considered. Electronically Signed   By: [name]  Register    On: 12/24/2020 16:48   EEG adult  Result Date: 12/14/2020 Raenette Rover, MD     12/07/2020  1:03 PM EEG Report Indication: Altered mental status--known L SDH/craniotomy This study was recorded in the waking/drowsy vs encephalopathic state.  The duration of the study was 28 minutes.  Electrodes were placed according to the International 10/20 system.  Video was reviewed for clinical correlation as needed.  There is some degree of waking organization during periods where there is muscle artifact indicative of a more alert state--there is a discernible though very attenuated anterior - posterior voltage and frequency gradient.  In the occipital leads there is a possible (definitive reactivity is not confirmed) but symmetric posterior dominant rhythm of approximately 7 hertz, which is significantly slower than expected for age, very poorly sustained and indistinct.  Anteriorly, is the expected pattern of faster frequency, lower voltage waveforms though in a significantly reduced amount as compared to normal.  There is intermittent delta slowing in the anterior>posterior leads which is grossly symmetric.  In general, waveforms are slightly higher in amplitude in the left hemisphere and better defined, although this is most likely related to breach artifact.  In addition, early loss of leads in the right posterior quadrant makes the comparison difficult. During the less alert (no muscle artifact/blink artifiact) there is the disappearance of the gradient, a general shift to slower frequencies, disappearance of the suspected posterior dominant rhythm, but no sleep architecture.  Hyperventilation: deferred Photic stimulation: deferred  There are no clear paroxysmal or epileptiform abnormalities or interhemispheric asymmetries seen during this portion of the recording.  Impression:  This is an abnormal waking and drowsy study due to very attenuated organization as described above.  In addition, there is some degree  of focality, with more distinct waveforms/slightly higher amplitudes in the left hemisphere--this is favored to be due to breach artifact however, and early lead loss in the right posterior quadrant makes an exact comparison difficult.  These findings are most consistent with a mild-moderate diffuse encephalopathy, with possible/not definitive increased areas of focal neuronal dysfunction in the right (artifact is more favored) and possibly in the anterior regions (bilaterally).  There are no clear epileptiform abnormalities.  VAS [place] UPPER EXTREMITY ARTERIAL DUPLEX  Result Date: 12/10/2020 UPPER EXTREMITY DUPLEX STUDY Indications: Patient complains of cool, cyanotic right upper extremity with  prolonged capillary refill.  Risk Factors:  Prior CVA. 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 Bilateral testing is considered an integral part of a complete examination. Limited examinations for reoccurring indications may be performed as noted.  Right Doppler Findings: +--------------+----------+--------------------------+--------+----------------+ Site          PSV (cm/s)Waveform                  StenosisComments         +--------------+----------+--------------------------+--------+----------------+ Subclavian    123                                                          Dist                                                                       +--------------+----------+--------------------------+--------+----------------+ Axillary                                          occluded                 +--------------+----------+--------------------------+--------+----------------+ Brachial Prox 15                                           Partial thrombus +--------------+----------+--------------------------+--------+----------------+ Brachial Mid  12        severely dampened                                                          monophasic                                         +--------------+----------+--------------------------+--------+----------------+ Brachial Dist 13        severely dampened                                                          monophasic                                         +--------------+----------+--------------------------+--------+----------------+ Radial Prox   9         severely dampened  monophasic                                         +--------------+----------+--------------------------+--------+----------------+ Radial Mid                                                appears occluded +--------------+----------+--------------------------+--------+----------------+ Radial Dist                                               appears occluded +--------------+----------+--------------------------+--------+----------------+ Ulnar Prox                                                poorly                                                                     visualized       +--------------+----------+--------------------------+--------+----------------+ Ulnar Dist    10        severely dampened                                                          monophasic                                         +--------------+----------+--------------------------+--------+----------------+    Summary:  Right: Right axillary artery appears occluded with partial thrombus        noted in the proximal brachial artery. The radial artery        demonstrates partial occlusion. *See table(s) above for measurements and observations. Electronically signed by Monica Martinez MD on 12/13/2020 at  12:57:31 PM.    Final    ECHOCARDIOGRAM COMPLETE BUBBLE STUDY  Result Date: 12/21/2020    ECHOCARDIOGRAM REPORT   Patient Name:   ALOYS HUPFER Date of Exam: 12/22/2020 Medical Rec #:  [id number]       Height:       69.0 in Accession #:    [id number]      Weight:       200.6 lb Date of Birth:  12/25/1928       BSA:          2.069 m Patient Age:    87 years        BP:           116/61 mmHg Patient Gender: M               HR:           72 bpm. Exam Location:  Inpatient Procedure: 2D Echo, Cardiac Doppler, Color Doppler and Saline Contrast Bubble            Study                         STAT ECHO Reported to: Dr. Ena Dawley on 12/13/2020 10:03:00 AM. Indications:    Stroke 434.91  History:        Patient has prior history of Echocardiogram examinations, most                 recent 11/14/2020. TIA and Stroke, Aortic Valve Disease,                 Arrythmias:Atrial Fibrillation; Risk Factors:Dyslipidemia and                 Sleep Apnea. Thoracic Ascending Aortic Aneurysm. DVT. CKD.                 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 Procedure Date:                 10/22/2020.  Sonographer:    Jonelle Sidle Dance Referring Phys: [id number] PAULA B SIMPSON  Sonographer Comments: Echo performed with patient supine and on artificial respirator. IMPRESSIONS  1. Negative bubble study.  2. Left ventricular ejection fraction, by estimation, is 60 to 65%. The left ventricle has normal function. The left ventricle has no regional wall motion abnormalities. There is mild concentric left ventricular hypertrophy. Left ventricular diastolic function could not be evaluated.  3. Right ventricular systolic function is normal. The right ventricular size is normal. There is mildly elevated pulmonary artery systolic pressure. The estimated right ventricular systolic pressure is 52.8 mmHg.  4. Left atrial size was severely dilated.  5. Right atrial size was  severely dilated.  6. The mitral valve is normal in structure. Mild mitral valve regurgitation. No evidence of mitral stenosis. Severe mitral annular calcification.  7. The aortic valve has been repaired/replaced. Aortic valve regurgitation is not visualized. No aortic stenosis is present. There is a 29 mm Edwards Sapien prosthetic (TAVR) valve present in the aortic position. Procedure Date: 10/22/2020. Aortic valve  mean gradient measures 16.0 mmHg.  8. The inferior vena cava is normal in size with greater than 50% respiratory variability, suggesting right atrial pressure of 3 mmHg.  9. Agitated saline contrast bubble study was negative, with no evidence of any interatrial shunt. FINDINGS  Left Ventricle: Left ventricular ejection fraction, by estimation, is 60 to 65%. The left ventricle has normal function. The left ventricle has no regional wall motion abnormalities. The left ventricular internal cavity size was normal in size. There is  mild concentric left ventricular hypertrophy. Left ventricular diastolic function could not be evaluated due to atrial fibrillation. Left ventricular diastolic function could not be evaluated. Right Ventricle: The right ventricular size is normal. No increase in right ventricular wall thickness. Right ventricular systolic function is normal. There is mildly elevated pulmonary artery systolic pressure. The tricuspid regurgitant velocity is 2.74  m/s, and with an assumed right atrial pressure of 8 mmHg, the estimated right ventricular systolic pressure is 41.3 mmHg. Left Atrium: Left atrial size was severely dilated. Right Atrium: Right atrial size was severely dilated. Pericardium: There is no evidence of pericardial effusion. Mitral Valve: The mitral valve is normal in  structure. There is mild thickening of the mitral valve leaflet(s). There is mild calcification of the mitral valve leaflet(s). Severe mitral annular calcification. Mild mitral valve regurgitation. No evidence of  mitral valve stenosis. Tricuspid Valve: The tricuspid valve is normal in structure. Tricuspid valve regurgitation is mild . No evidence of tricuspid stenosis. Aortic Valve: The aortic valve has been repaired/replaced. Aortic valve regurgitation is not visualized. No aortic stenosis is present. Aortic valve mean gradient measures 16.0 mmHg. Aortic valve peak gradient measures 27.9 mmHg. Aortic valve area, by VTI measures 2.06 cm. There is a 29 mm Edwards Sapien prosthetic, stented (TAVR) valve present in the aortic position. Procedure Date: 10/22/2020. Pulmonic Valve: The pulmonic valve was normal in structure. Pulmonic valve regurgitation is mild. No evidence of pulmonic stenosis. Aorta: The aortic root is normal in size and structure. Venous: The inferior vena cava is normal in size with greater than 50% respiratory variability, suggesting right atrial pressure of 3 mmHg. IAS/Shunts: No atrial level shunt detected by color flow Doppler. Agitated saline contrast was given intravenously to evaluate for intracardiac shunting. Agitated saline contrast bubble study was negative, with no evidence of any interatrial shunt.  LEFT VENTRICLE PLAX 2D LVIDd:         5.70 cm LVIDs:         4.10 cm LV PW:         1.90 cm LV IVS:        0.90 cm LVOT diam:     2.40 cm LV SV:         97 LV SV Index:   47 LVOT Area:     4.52 cm  RIGHT VENTRICLE          IVC RV Basal diam:  3.70 cm  IVC diam: 2.00 cm RV Mid diam:    2.30 cm TAPSE (M-mode): 1.6 cm LEFT ATRIUM              Index        RIGHT ATRIUM           Index LA diam:        7.60 cm  3.67 cm/m   RA Area:     31.50 cm LA Vol (A2C):   256.0 ml 123.75 ml/m RA Volume:   103.00 ml 49.79 ml/m LA Vol (A4C):   239.0 ml 115.53 ml/m LA Biplane Vol: 249.0 ml 120.36 ml/m  AORTIC VALVE AV Area (Vmax):    1.87 cm AV Area (Vmean):   1.87 cm AV Area (VTI):     2.06 cm AV Vmax:           264.33 cm/s AV Vmean:          186.333 cm/s AV VTI:            0.474 m AV Peak Grad:      27.9 mmHg  AV Mean Grad:      16.0 mmHg LVOT Vmax:         109.00 cm/s LVOT Vmean:        76.900 cm/s LVOT VTI:          0.216 m LVOT/AV VTI ratio: 0.45  AORTA Ao Root diam: 3.90 cm Ao Asc diam:  3.80 cm MITRAL VALVE                TRICUSPID VALVE MV Area (PHT): 2.91 cm     TR Peak grad:   30.0 mmHg MV Decel Time: 261 msec     TR  Vmax:        274.00 cm/s MV E velocity: 132.00 cm/s                             SHUNTS                             Systemic VTI:  0.22 m                             Systemic Diam: 2.40 cm Ena Dawley MD Electronically signed by Ena Dawley MD Signature Date/Time: 12/17/2020/11:20:59 AM    Final    IR PERCUTANEOUS ART THROMBECTOMY/INFUSION INTRACRANIAL INC DIAG ANGIO  Result Date: 12/27/2020 INDICATION: New onset of left-sided weakness, right gaze deviation. Occluded prominent inferior division of the right middle cerebral artery on CT angiogram with a prominent penumbra on CT perfusion maps. EXAM: 1. EMERGENT LARGE VESSEL OCCLUSION THROMBOLYSIS (anterior CIRCULATION) COMPARISON:  CT angiogram of the head and neck of December 25, 2020. MEDICATIONS: MEDICATIONS Ancef 2 g IV antibiotic was administered within 1 hour of the procedure. ANESTHESIA/SEDATION: General anesthesia CONTRAST:  Isovue 300 approximately 85 mL. FLUOROSCOPY TIME:  Fluoroscopy Time: 67 minutes 54 seconds (1502 mGy). COMPLICATIONS: None immediate. TECHNIQUE: Following a full explanation of the procedure along with the potential associated complications, an informed witnessed consent was obtained. The risks of intracranial hemorrhage of 10%, worsening neurological deficit, ventilator dependency, death and inability to revascularize were all reviewed in detail with the patient's daughter. The patient was then put under general anesthesia by the Department of Anesthesiology at [facility]. The right groin was prepped and draped in the usual sterile fashion. Thereafter using modified Seldinger technique, transfemoral access  into the right common femoral artery was obtained without difficulty. Over a 0.035 inch guidewire an 8 French 5 Pinnacle sheath was inserted. Through this, and also over a 0.035 inch guidewire a 5 French Simmons 2 support catheter within the balloon guide catheter was advanced to the carotid arteries. The Simmons catheter was formed in the ascending aorta. Select cannulation was then performed of the right common carotid artery over an 035 inch glide wire. The balloon guide was advanced to the bulb of the right internal carotid artery. The guidewire was removed. Good aspiration obtained from the hub of the balloon guide catheter. Gentle control arteriogram performed through this demonstrated patency of the right internal carotid artery to the cranial skull base. Extensive tortuosity extra cranially and intracranially of the right internal carotid artery remains stable associated with mild fusiform dilatation seen in the cavernous segment. Patency of the right internal carotid artery supraclinoid segment was maintained. Right middle cerebral artery opacified into the capillary and venous phases with occluded dominant inferior division at its origin. FINDINGS: See above. PROCEDURE: Through the balloon guide catheter in the right internal carotid artery bulb, a combination of a 55 136 cm Zoom aspiration catheter with an 021 162 cm Trevo ProVue microcatheter was advanced initially over an 016 Fathom micro guidewire to the supraclinoid right ICA and then this was exchanged for an 014 inch Synchro support micro guidewire. Using a torque device, access into the occluded dominant inferior division was obtained with the micro guidewire which was advanced to the M2 M3 region followed by the microcatheter. At this time the Zoom 55 aspiration catheter was advanced and positioned at the origin of the  inferior division. A 6.5 mm x 45 mm Embotrap retrieval device was advanced to the distal end of the microcatheter and deployed by  unsheathing the microcatheter. With proximal flow arrest at the bulb of the right internal carotid artery, constant aspiration was applied at the hub of the 55 Zoom aspiration catheter, and with a 20 mL syringe at the hub of the balloon guide catheter for approximately 2 minutes. Thereafter, the combination of the retrieval device, the microcatheter, and the Zoom aspiration catheter was retrieved and removed. Flow arrest was reversed. A control arteriogram performed through the balloon guide catheter into the bulb of the right internal carotid artery now demonstrated complete revascularization of the previously occluded inferior division achieving a TICI 3 revascularization in the right MCA distribution. The right anterior cerebral artery remained widely patent into the capillary and venous phases. The balloon guide was retrieved and removed. The 8 French Pinnacle sheath was replaced with an 8 French Angio-Seal closure device with hemostasis. Distal pulses were Dopplerable in both feet unchanged from prior to the procedure. A CT scan of the brain on the table demonstrated no evidence of mass effect, midline shift or of intracranial hemorrhage, The patient was left intubated and returned to neuro ICU in stable condition. IMPRESSION: Status post endovascular complete revascularization of occluded inferior division of the right middle cerebral artery with a 6.5 mm x 45 mm Embotrap retrieval device and proximal aspiration achieving a TICI 3 revascularization. PLAN: Follow-up in the clinic approximately 4 weeks post discharge. Electronically Signed   By: Luanne Bras M.D.   On: 12/31/2020 15:05   IR PERCUTANEOUS ART THROMBECTOMY/INFUSION INTRACRANIAL INC DIAG ANGIO  Result Date: 12/29/2020 INDICATION: New onset slurred speech, confusion and left-sided weakness and right gaze preference. EXAM: 1. EMERGENT LARGE VESSEL OCCLUSION THROMBOLYSIS (anterior CIRCULATION) COMPARISON:  MRI scan 12/28/2020, and CT  angiogram of the head and neck of 12/13/2020. MEDICATIONS: Ancef 2 g IV antibiotic was administered within 1 hour of the procedure. ANESTHESIA/SEDATION: General anesthesia CONTRAST:  Isovue 300 50 mL FLUOROSCOPY TIME:  Fluoroscopy Time: 45 minutes 36 seconds (1333 mGy). COMPLICATIONS: None immediate. TECHNIQUE: Following a full explanation of the procedure along with the potential associated complications, an informed witnessed consent was obtained. The risks of intracranial hemorrhage of 10%, worsening neurological deficit, ventilator dependency, death and inability to revascularize were all reviewed in detail with the patient's family. The patient was then put under general anesthesia by the Department of Anesthesiology at [facility]. The right groin was prepped and draped in the usual sterile fashion. Thereafter using modified Seldinger technique, transfemoral access into the right common femoral artery was obtained without difficulty. Over a 0.035 inch guidewire an 8 French 25 cm Pinnacle sheath was inserted. Through this, and also over a 0.035 inch guidewire a 5 French Simmons 2 support catheter inside an 087 balloon guide catheter was advanced to the aortic arch region and selectively positioned in the right common carotid artery at the bifurcation. The support Simmons 2 and catheter, and the guidewire were removed. Good aspiration obtained from the hub of the balloon guide catheter at the right common carotid bifurcation. FINDINGS: A gentle control arteriogram performed through the balloon guide catheter demonstrated right external carotid artery and its branches to be widely patent. The right internal carotid artery at the bulb and just distally demonstrates complete angiographic occlusion. No evidence of a delayed string sign noted. No distal reconstitution of the right middle cerebral artery from the external carotid artery branches was noted  either. PROCEDURE: Through the balloon guide catheter,  over a 016 inch Fathom micro guidewire a 162 cm 021 Trevo ProVue microcatheter inside an 071 130 cm Zoom aspiration catheter combination was advanced gingerly, slowly and deliberately into the right middle cerebral artery inferior division M2 segment followed by the microcatheter. Zoom aspiration catheter was advanced into the mid M1 segment. A 6.5 mm x 45 mm Embotrap retrieval device was then advanced to the distal end of the microcatheter. This was then deployed by retrieving the microcatheter which was then retrieved further into the right cervical ICA. Thereafter, with proximal flow arrest in the right internal carotid artery with the balloon guide catheter, and constant aspiration at the hub of the balloon guide catheter with a 20 mL syringe, and with Penumbra aspiration device at the hub 071 Zoom aspiration catheter for approximately 2 minutes, the combination was retrieved and removed. Following reversal of flow arrest, a control arteriogram performed through the balloon guide in the right common carotid artery demonstrated complete revascularization of the right middle cerebral artery and the right anterior cerebral artery distributions. No evidence of irregularities or of intimal flaps or filling defects were seen within the internal carotid artery extra cranially or intracranially. Extensive clots were seen in the canister of the aspiration device. Few clots were seen entangled in the retrieval device itself. Throughout the procedure the patient's blood pressure and neurological status remained stable. Balloon guide was removed. The 8 French neurovascular sheath was removed and replaced with an 8 French Angio-Seal closure device for hemostasis. The right groin appeared soft without evidence of hemorrhage. Distal pulses remained Dopplerable in both lower extremities. Post CT of the brain demonstrated no evidence of intracranial hemorrhage, mass effect or midline shift. Patient was left intubated to protect  the airway. Patient was then transferred to neuro ICU for post thrombectomy management. IMPRESSION: Status post endovascular complete revascularization of occluded internal carotid artery extra cranially and intracranially, and of the right middle cerebral artery with 1 pass with 6.5 mm x 45 mm Embotrap retrieval device with aspiration and proximal flow arrest achieving a TICI 3 revascularization. PLAN: Follow-up in the clinic 4 weeks post discharge p.r.n. Electronically Signed   By: Luanne Bras M.D.   On: 12/24/2020 15:12   VAS [place] LOWER EXTREMITY VENOUS (DVT)  Result Date: 12/21/2020  Lower Venous DVT Study Indications: Swelling, and Edema.  Limitations: Patient's positioning and altered mental status. Comparison Study: no prior Performing Technologist: Sharion Dove RVS  Examination Guidelines: A complete evaluation includes B-mode imaging, spectral Doppler, color Doppler, and power Doppler as needed of all accessible portions of each vessel. Bilateral testing is considered an integral part of a complete examination. Limited examinations for reoccurring indications may be performed as noted. The reflux portion of the exam is performed with the patient in reverse Trendelenburg.  +---------+---------------+---------+-----------+----------+-------------------+ RIGHT    CompressibilityPhasicitySpontaneityPropertiesThrombus Aging      +---------+---------------+---------+-----------+----------+-------------------+ CFV      Full                                                             +---------+---------------+---------+-----------+----------+-------------------+ SFJ      Full                                                             +---------+---------------+---------+-----------+----------+-------------------+  FV Prox  Full                                                             +---------+---------------+---------+-----------+----------+-------------------+ FV Mid    Full                                                             +---------+---------------+---------+-----------+----------+-------------------+ FV DistalFull                                                             +---------+---------------+---------+-----------+----------+-------------------+ PFV      Full                                                             +---------+---------------+---------+-----------+----------+-------------------+ POP                                                   patent by color and                                                       Doppler             +---------+---------------+---------+-----------+----------+-------------------+ PTV      None                                         Acute               +---------+---------------+---------+-----------+----------+-------------------+ PERO     None                                         Acute               +---------+---------------+---------+-----------+----------+-------------------+   +---------+---------------+---------+-----------+----------+-------------------+ LEFT     CompressibilityPhasicitySpontaneityPropertiesThrombus Aging      +---------+---------------+---------+-----------+----------+-------------------+ CFV      Full           Yes      Yes                                      +---------+---------------+---------+-----------+----------+-------------------+ SFJ      Full                                                             +---------+---------------+---------+-----------+----------+-------------------+  FV Prox  Full                                                             +---------+---------------+---------+-----------+----------+-------------------+ FV Mid   Full                                                             +---------+---------------+---------+-----------+----------+-------------------+ FV  DistalFull                                                             +---------+---------------+---------+-----------+----------+-------------------+ PFV      Full                                                             +---------+---------------+---------+-----------+----------+-------------------+ POP                                                   patent by color and                                                       Doppler             +---------+---------------+---------+-----------+----------+-------------------+ PTV      Full                                                             +---------+---------------+---------+-----------+----------+-------------------+ PERO     None                                         Acute               +---------+---------------+---------+-----------+----------+-------------------+     Summary: RIGHT: - Findings consistent with acute deep vein thrombosis involving the right posterior tibial veins, and right peroneal veins.  LEFT: - Findings consistent with acute deep vein thrombosis involving the left peroneal veins.  *See table(s) above for measurements and observations. Electronically signed by Servando Snare MD on 12/22/2020 at 10:18:06 PM.    Final     Labs:  CBC: Recent Labs    12/24/20 4854 12/06/2020 6270 12/04/2020 2034 12/19/2020 0522 12/27/20 3500  WBC 9.2 7.0  --  7.3 7.9  HGB 9.9* 9.7* 7.1* 10.0* 8.9*  HCT 30.0* 29.8* 21.0* 29.3* 28.1*  PLT 126* 129*  --  142* 112*    COAGS: Recent Labs    10/19/20 0324 10/19/20 1424 10/22/20 0456 11/19/20 1751  INR  --   --  1.1 1.3*  APTT 123* 108* 35 36    BMP: Recent Labs    08/12/20 1137 10/16/20 1000 10/30/20 1520 11/19/20 1751 12/24/20 0514 12/05/2020 0523 12/19/2020 2034 12/04/2020 0522 12/27/20 0415  NA 142   < > 141   < > 139 136 141 138 140  K 4.8   < > 4.9   < > 4.1 3.9 3.5 3.8 4.3  CL 108*   < > 104   < > 110 109  --  109 112*  CO2 22    < > 23   < > 21* 23  --  20* 21*  GLUCOSE 104*   < > 119*   < > 105* 93  --  117* 93  BUN 24   < > 28*   < > 22 21  --  18 24*  CALCIUM 9.1   < > 8.8   < > 8.1* 7.8*  --  7.8* 7.8*  CREATININE 1.35*   < > 1.28*   < > 1.16 1.18  --  1.41* 1.82*  GFRNONAA 46*   < > 49*   < > >60 59*  --  48* 35*  GFRAA 53*  --  57*  --   --   --   --   --   --    < > = values in this interval not displayed.    LIVER FUNCTION TESTS: Recent Labs    11/19/20 1751 12/01/20 0610 12/03/20 0509 12/22/2020 0218  BILITOT 0.9 0.7 1.0 0.8  AST 28 23 23 27   ALT 22 15 17 17   ALKPHOS 59 50 50 42  PROT 6.6 5.7* 6.1* 5.9*  ALBUMIN 3.1* 2.7* 2.8* 2.6*    Assessment and Plan:  History of acute CVA s/p cerebral arteriogram with emergent mechanical thrombectomy of right ICA terminus and right MCA occlusions achieving a TICI 3 revascularization via right femoral approach 12/31/2020 by Dr. Estanislado Pandy. Patient's condition continues to improve - extubated yesterday, awake and alert follows simple commands, can move all extremities. Speech dysarthric.  Left extremities weaker than right.  PT/OT/Speech therapy   Further plan per neurology/CCM/vascular surgery Appreciate and agree with treatment NIR to follow     Electronically Signed: Tera Mater, PA-C 12/27/2020, 8:58 AM   I spent a total of 25 Minutes at the the patient's bedside AND on the patient's hospital floor or unit, greater than 50% of which was counseling/coordinating care for follow CVA s/p revascularization.

## 2020-12-27 NOTE — Evaluation (Addendum)
Physical Therapy Re-Evaluation Patient Details Name: Cesar [name] MRN: [id number] DOB: Nov 14, 1928 Today's Date: 12/27/2020   History of Present Illness  Pt is an 87 y.o. male who presented 3/21 with a fever and weakness. Pt found to have severe sepsis secondary to UTI, acute metabolic encephalopathy, and acute bil lower extremity DVTs. 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 S/p complete revascularization of occluded ICA to the terminus and RT MCA 3/21. ETT 3/21-3/22. Pt found unresponsive 3/23, imaging revealed R M1 occlusion with large penumbra. S/p thrombectomy with reperfusion 3/23. On 3/24 RUE found to acutely ischemic, taken to OR for thrombectomy of R axillary artery. Extubated 3/24. PMH: A. fib, CVA, seizure disorder, history of DVT, hyperlipidemia, CKD stage III, severe aortic stenosis status post TAVR in January 2022, BPH, OSA not on CPAP, ascending thoracic aortic aneurysm, and L subdural hemorrhage 11/2020 s/p burr hole drainage.    Clinical Impression  The pt was seen by PT/OT for improved safety with initial mobility following most recent revascularization. The pt was able to respond to voice and commands this session, opening eyes, and visually tracking at times, but repeatedly stating "I am fine" and asking to lay back down. The pt was able to follow commands with RUE initially, but increased difficulty with fatigue through session. The pt requires max/totalA for all bed mobility and sitting EOB at this time. Required maxA of 2 to complete sit-stand with use of bed pad, but was unable to generate wt shift or any clearance to initiate stepping at this time. Will continue to benefit from skilled PT to progress functional strength, static sitting balance, and power to allow for improved tolerance and independence with transfers. Will continue to work towards CIR level therapies at this time.      Follow Up Recommendations CIR    Equipment Recommendations   (defer to post acute)    Recommendations for Other Services       Precautions / Restrictions Precautions Precautions: Fall Precaution Comments: HOH, ataxic, L neglect Restrictions Weight Bearing Restrictions: No Other Position/Activity Restrictions: HOH, does have hearing aides      Mobility  Bed Mobility Overal bed mobility: Needs Assistance Bed Mobility: Rolling;Supine to Sit;Sit to Supine Rolling: +2 for physical assistance;Total assist   Supine to sit: Total assist;+2 for physical assistance Sit to supine: +2 for physical assistance;Total assist   General bed mobility comments: pt does not initiate task but has eyes open . pt asking to return to supine and to remain supine    Transfers Overall transfer level: Needs assistance Equipment used: 2 person hand held assist Transfers: Sit to/from Stand Sit to Stand: +2 physical assistance;Max assist         General transfer comment: L le requires blocking total (A) . pt able to initiate with sit<>Stand but does not sustain. pt with flexed posture.  Ambulation/Gait             General Gait Details: pt unable to generate any steps at this time   Modified Rankin (Stroke Patients Only) Modified Rankin (Stroke Patients Only) Pre-Morbid Rankin Score: Moderate disability Modified Rankin: Severe disability     Balance Overall balance assessment: Needs assistance Sitting-balance support: No upper extremity supported;Feet supported Sitting balance-Leahy Scale: Zero Sitting balance - Comments: reliant on therapist support   Standing balance support: Bilateral upper extremity supported;During functional activity Standing balance-Leahy Scale: Poor Standing balance comment: blocking bil  LE for sit<>Stand unable to side step                             Pertinent Vitals/Pain Pain Assessment: Faces Faces Pain Scale: Hurts a little  bit Pain Location: generalized - "I am fine. lay be back down" Pain Descriptors / Indicators: Discomfort Pain Intervention(s): Monitored during session;Repositioned    Home Living Family/patient expects to be discharged to:: Private residence Living Arrangements: Spouse/significant other Available Help at Discharge: Family;Available 24 hours/day Type of Home: House Home Access: Stairs to enter Entrance Stairs-Rails: Right Entrance Stairs-Number of Steps: 2 Home Layout: One level Home Equipment: Walker - 2 wheels;Bedside commode;Cane - single point Additional Comments: home setup obtained from PT evaluation    Prior Function Level of Independence: Needs assistance   Gait / Transfers Assistance Needed: pt was ambulating at home with min guard assist after recent d/c from CIR therapies - supervision and use of RW  ADL's / Homemaking Assistance Needed: Per wife, pt was performing ADLs with supervision - min guard assist  Comments: Per last admission: pt reports that he has 12 acres of land and likes riding his machinery on the land. 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 pt with bandage marked for drainage size (red blood noted) LUE Deficits / Details: no movement noted during session. edema noted throughout arm    Lower Extremity Assessment Lower Extremity Assessment: Difficult to assess due to impaired cognition;RLE deficits/detail;LLE deficits/detail RLE Deficits / Details: no active movement noted in session, pt able to tolerate PROM LLE Deficits / Details: no active movement noted in session, pt able to tolerate PROM       Communication   Communication: HOH  (hearing aides present)  Cognition Arousal/Alertness: Awake/alert Behavior During Therapy: Flat affect Overall Cognitive Status: Difficult to assess                                 General Comments: pt does not report family members in the room when asked. Pt only repeats "i am good" "you can lay me back now"      General Comments General comments (skin integrity, edema, etc.): RA 88% with return to supine . placed back on 2L [place] 92% VSS    Exercises     Assessment/Plan    PT Assessment Patient needs continued PT services  PT Problem List Decreased strength;Decreased activity tolerance;Decreased balance;Decreased mobility;Decreased knowledge of use of DME;Decreased safety awareness;Decreased knowledge of precautions;Decreased range of motion;Decreased cognition;Decreased coordination       PT Treatment Interventions DME instruction;Gait training;Stair training;Functional mobility training;Therapeutic activities;Therapeutic exercise;Balance training;Neuromuscular re-education;Cognitive remediation;Patient/family education    PT Goals (Current goals can be found in the Care Plan section)  Acute Rehab PT Goals Patient Stated Goal: to lay back PT Goal Formulation: With patient Time For Goal Achievement: 01/10/21 Potential to Achieve Goals: Good    Frequency Min 4X/week   Barriers to discharge        Co-evaluation PT/OT/SLP Co-Evaluation/Treatment: Yes Reason for Co-Treatment: Complexity of the patient's impairments (multi-system involvement);Necessary to address cognition/behavior during  functional activity;For patient/therapist safety;To address functional/ADL transfers PT goals addressed during session: Mobility/safety with mobility;Balance OT goals addressed during session: ADL's and self-care;Proper use of Adaptive equipment and DME;Strengthening/ROM       AM-PAC PT "6 Clicks" Mobility  Outcome Measure Help needed turning from your back to your side while  in a flat bed without using bedrails?: A Lot Help needed moving from lying on your back to sitting on the side of a flat bed without using bedrails?: A Lot Help needed moving to and from a bed to a chair (including a wheelchair)?: Total Help needed standing up from a chair using your arms (e.g., wheelchair or bedside chair)?: A Lot Help needed to walk in hospital room?: Total Help needed climbing 3-5 steps with a railing? : Total 6 Click Score: 9    End of Session Equipment Utilized During Treatment: Gait belt Activity Tolerance: Patient tolerated treatment well Patient left: in bed;with call bell/phone within reach;with bed alarm set;with family/visitor present Nurse Communication: Mobility status PT Visit Diagnosis: Unsteadiness on feet (R26.81);Other abnormalities of gait and mobility (R26.89);Muscle weakness (generalized) (M62.81);Other symptoms and signs involving the nervous system (R29.898);Difficulty in walking, not elsewhere classified (R26.2)    Time: 7322-5672 PT Time Calculation (min) (ACUTE ONLY): 23 min   Charges:   PT Evaluation $PT Re-evaluation: 1 Re-eval          Karma Ganja, PT, DPT   Acute Rehabilitation Department Pager #: [phone]  Otho Bellows 12/27/2020, 2:41 PM

## 2020-12-27 NOTE — Progress Notes (Signed)
Initial Nutrition Assessment  DOCUMENTATION CODES:   Non-severe (moderate) malnutrition in context of acute illness/injury  INTERVENTION:   Initiate tube feeding via Cortrak tube: Pivot 1.5 at 60 ml/h (1440 ml per day)  Provides 2160 kcal, 135 gm protein, 1092 ml free water daily  MVI with minerals via cortrak tube   Monitor magnesium and phosphorus every 12 hours x 4 occurances, MD to replete as needed, as pt is at risk for refeeding syndrome given acute moderate malnutrition.   NUTRITION DIAGNOSIS:   Moderate Malnutrition related to acute illness (recent hospitalizations) as evidenced by mild fat depletion,severe muscle depletion.  GOAL:   Patient will meet greater than or equal to 90% of their needs  MONITOR:   Diet advancement,TF tolerance,Labs  REASON FOR ASSESSMENT:   Consult Enteral/tube feeding initiation and management,Assessment of nutrition requirement/status  ASSESSMENT:   Pt with PMH of CVA, sz, a.fib, HLD, CKD stage III, severe aortic stenosis s/p TAVR 1/22, OSA, ascending thoracic aortic aneurysm, CAD, previously admitted 11/19/20 for large L chronic SDH s/p burr hole then admitted to rehab 12/02/20 discharged 3/17. Pt now admitted for weakness and fever dx with severe sepsis secondary to UTI.    Pt discussed during ICU rounds and with RN.  Spoke with pt's son and daughter. Pt lives with wife, son lives in front of pt's house. Per family pt has been in and out of hospital since the beginning of the year. After his most recent admission pt went to rehab and had gotten back to his baseline with walking with the assistance of a walker. Per family they have not noticed any weight changes and reports that he has a good appetite and would eat whatever you put in front of him and tends to eat really fast.  Noted depletions in NFPE some related to his recent acute hospitalizations as well as old age. Noted exam changes since his Nov 2020 admission.    3/21 s/p IR  thrombectomy  3/22 extubated 3/23 s/p IR thrombectomy  3/24 s/p emergent RUE thrombectomy 3/25 cortrak placed; tip gastric    Medications reviewed and include: colace, miralax  Labs reviewed    NUTRITION - FOCUSED PHYSICAL EXAM:  Flowsheet Row Most Recent Value  Orbital Region Mild depletion  Upper Arm Region No depletion  Thoracic and Lumbar Region No depletion  Buccal Region Mild depletion  Temple Region Severe depletion  Clavicle Bone Region No depletion  Clavicle and Acromion Bone Region Severe depletion  Scapular Bone Region Severe depletion  Dorsal Hand No depletion  [edema]  Patellar Region No depletion  Anterior Thigh Region No depletion  Posterior Calf Region No depletion  Edema (RD Assessment) --  [mild to moderate edema]  Hair Reviewed  Eyes Unable to assess  Mouth Unable to assess  Skin Reviewed  [dry, ecchymosis]  Nails Reviewed       Diet Order:   Diet Order            Diet NPO time specified  Diet effective now                 EDUCATION NEEDS:   No education needs have been identified at this time  Skin:  Skin Assessment: Reviewed RN Assessment (incisions)  Last BM:  3/23 large; type4  Height:   Ht Readings from Last 1 Encounters:  12/27/20 6\' 1"  (1.854 m)    Weight:   Wt Readings from Last 1 Encounters:  12/10/2020 91 kg    Ideal Body Weight:  83.6 kg  BMI:  Body mass index is 26.47 kg/m.  Estimated Nutritional Needs:   Kcal:     Protein:     Fluid:     Heather P., RD, LDN, CNSC See AMiON for contact information

## 2020-12-27 NOTE — Progress Notes (Signed)
Occupational Therapy Treatment Patient Details Name: Cesar [name] MRN: [id number] DOB: 17-Apr-1929 Today's Date: 12/27/2020    History of present illness Pt is an 87 y.o. male who presented 3/21 with a fever and weakness. Pt found to have severe sepsis secondary to UTI, acute metabolic encephalopathy, and acute bil lower extremity DVTs. 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 S/p complete revascularization of occluded ICA to the terminus and RT MCA 3/21. ETT 3/21-3/22. PMH: A. fib, CVA, seizure disorder, history of DVT, hyperlipidemia, CKD stage III, severe aortic stenosis status post TAVR in January 2022, BPH, OSA not on CPAP, ascending thoracic aortic aneurysm, and L subdural hemorrhage 11/2020 s/p burr hole drainage.   OT comments  Pt currently aroused and answering therapist to name call. Pt repeating that he is "I am fine leave me alone" or "lay me back down". Son and daughter present and encouraging patient to engage in therapy with OT/PT. Pt tolerates session at EOB. Pt with L inattention noted and no movement noted with bil UE in sitting. Pt with edema present. BIL UE elevated for edema management and bil feet heel floating. Recommendations CIR per daughter.  Recommendations for pravalon boots for heel floating. Please order if agree  Follow Up Recommendations       Equipment Recommendations       Recommendations for Other Services      Precautions / Restrictions Precautions Precautions: Fall Precaution Comments: HOH, ataxic, L neglect Restrictions Weight Bearing Restrictions: No Other Position/Activity Restrictions: HOH, does have hearing aides       Mobility Bed Mobility Overal bed mobility: Needs Assistance Bed Mobility: Rolling;Supine to Sit;Sit to Supine Rolling: +2 for physical assistance;Total assist   Supine to sit: Total assist;+2 for physical assistance Sit to  supine: +2 for physical assistance;Total assist   General bed mobility comments: pt does not initiate task but has eyes open . pt asking to return to supine and to remain supine    Transfers Overall transfer level: Needs assistance Equipment used: 2 person hand held assist Transfers: Sit to/from Stand Sit to Stand: +2 physical assistance;Max assist         General transfer comment: L le requires blocking total (A) . pt able to initiate with sit<>Stand but does not sustain. pt with flexed posture.    Balance Overall balance assessment: Needs assistance Sitting-balance support: No upper extremity supported;Feet supported Sitting balance-Leahy Scale: Zero Sitting balance - Comments: reliant on therapist support   Standing balance support: Bilateral upper extremity supported;During functional activity Standing balance-Leahy Scale: Poor Standing balance comment: blocking bil LE for sit<>Stand unable to side step                           ADL either performed or assessed with clinical judgement   ADL Overall ADL's : Needs assistance/impaired Eating/Feeding: NPO   Grooming: Total assistance   Upper Body Bathing: Total assistance   Lower Body Bathing: Total assistance                         General ADL Comments: completed sit <>stand at [facility]     Vision       Perception     Praxis      Cognition Arousal/Alertness: Awake/alert Behavior During Therapy: Flat affect Overall Cognitive Status: Difficult to assess  General Comments: pt does not report family members in the room when asked. Pt only repeats "i am good" "you can lay me back now"        Exercises     Shoulder Instructions       General Comments RA 88% with return to supine . placed back on 2L [place] 92% VSS    Pertinent Vitals/ Pain       Pain Assessment: Faces Faces Pain Scale: Hurts a little bit Pain Location: generalized - I am fine.  lay be back down Pain Descriptors / Indicators: Discomfort Pain Intervention(s): Monitored during session;Premedicated before session;Repositioned  Home Living                                          Prior Functioning/Environment              Frequency           Progress Toward Goals  OT Goals(current goals can now be found in the care plan section)  Progress towards OT goals: Progressing toward goals  Acute Rehab OT Goals Patient Stated Goal: to lay back OT Goal Formulation: With patient Time For Goal Achievement: 01/09/21 Potential to Achieve Goals: Good ADL Goals Pt Will Perform Eating: with supervision;with adaptive utensils;sitting Pt Will Perform Grooming: with min assist;standing Pt Will Perform Upper Body Bathing: with supervision;sitting Pt Will Perform Lower Body Bathing: with mod assist;sit to/from stand;with adaptive equipment Pt Will Perform Upper Body Dressing: with set-up;sitting;with adaptive equipment Pt Will Perform Lower Body Dressing: with min guard assist;sit to/from stand;with adaptive equipment Pt Will Transfer to Toilet: with mod assist;ambulating;regular height toilet;bedside commode;grab bars Pt Will Perform Toileting - Clothing Manipulation and hygiene: with mod assist;sit to/from stand Additional ADL Goal #1: pt will complete fine motor exercise with handout min (A) as precursor to adls  Plan      Co-evaluation    PT/OT/SLP Co-Evaluation/Treatment: Yes Reason for Co-Treatment: Necessary to address cognition/behavior during functional activity;For patient/therapist safety;To address functional/ADL transfers   OT goals addressed during session: ADL's and self-care;Proper use of Adaptive equipment and DME;Strengthening/ROM      AM-PAC OT "6 Clicks" Daily Activity     Outcome Measure                    End of Session        Activity Tolerance     Patient Left     Nurse Communication          Time:  4332-9518 OT Time Calculation (min): 25 min  Charges: OT General Charges $OT Visit: 1 Visit OT Treatments $Self Care/Home Management : 8-22 mins   Brynn, OTR/L  Acute Rehabilitation Services Pager: [phone] Office: [phone] .    Jeri Modena 12/27/2020, 11:13 AM

## 2020-12-27 NOTE — Progress Notes (Addendum)
STROKE TEAM PROGRESS NOTE    HPI/Signifcant events: Patient has previous TIA and incidental left cerebellum infarct in 03/2019.  At that time, CT old left MCA infarct, MRA right siphon mild to moderate stenosis, carotid Doppler negative, EF 60 to 65%, LDL 94 and A1c 5.3.  Patient continued on Coumadin and Lipitor 80.  Patient admitted 11/2020 for left large SDH status post burr hole evacuation.  Anticoagulation was on hold.  Patient developed worsening right-sided weakness and aphasia, EEG negative, concerning for seizure activity put on Depakote 1000 mg nightly.  This admission presentation: patient admitted for confusion, fever, urosepsis and bilateral DVT.  However patient developed acute worsening of speech and left-sided weakness in the ED.  Code stroke activated, CT showed left SDH much improved, stable bilateral frontal encephalomalacia and BG lacunar infarcts.  MRI showed small multiple punctate scattered infarcts at right MCA, left CR, left MCA/PCA.  However, found to have an no flow of right ICA.  CT head and neck done showed occlusion of right ICA from bulb to terminal and proximal MCA.  CT perfusion showed large penumbra.  Underwent thrombectomy with TICI3 reperfusion.  CT repeat no bleeding.  3/23 evening  S/p repeated stroke work up late afternoon after patient was found up in chair slumped over and not responsive. Noted to have right gaze and left hemiplegia. CT head no acute change. The pt at that time was able to open eyes on voice stimulation but still not verbal or following commands, still had left hemiplegia.  CTA head and neck showed right M1 occlusion and short segment of right P2 high grade stenosis vs. Occlusion. CTP showed large penumbra. He was intubated and taken emergently to IR by Dr. Estanislado Pandy for agram/thrombectomy.  Returned to ICU intubated and sedated. Bilat LE DVTs noted 3/23.   3/24 Right axillary artery occlussion with partial thrombus noted early am today. Vascular  surgery called and taken to OR today for emergent thrombectomy.  Patient remains intubated and sedated on precedex. Remains weak in the left hemibody and withdraws right hemibody.   INTERVAL HISTORY Extubated 3/24 1600 postop. A fib with RVR on cardizem gtt briefly. Now bradycardic at times. Febrile to 101.2 (CCM plans to monitor). Creatinine trending up to 1.8 in setting of 3 procedures w/contrast.   Somnolent, arouses easily to verbal stimuli. Following simple commands. Moving all extremities. Poor cough effort.   Vitals:   12/27/20 0500 12/27/20 0600 12/27/20 0700 12/27/20 0715  BP: (!) 102/50 (!) 114/52 (!) 126/58   Pulse: (!) 59 (!) 49 (!) 58 67  Resp: (!) 22 (!) 23 19 (!) 22  Temp:      TempSrc:      SpO2: 100% 100% 100% 100%  Weight:      Height:       CBC:  Recent Labs  Lab 12/03/2020 0522 12/27/20 0415  WBC 7.3 7.9  NEUTROABS 6.2 5.5  HGB 10.0* 8.9*  HCT 29.3* 28.1*  MCV 98.0 100.4*  PLT 142* 474*   Basic Metabolic Panel:  Recent Labs  Lab 12/08/2020 0523 12/21/2020 2034 12/28/2020 0522 12/27/20 0415  NA 136   < > 138 140  K 3.9   < > 3.8 4.3  CL 109  --  109 112*  CO2 23  --  20* 21*  GLUCOSE 93  --  117* 93  BUN 21  --  18 24*  CREATININE 1.18  --  1.41* 1.82*  CALCIUM 7.8*  --  7.8* 7.8*  MG  1.9  --   --  1.9   < > = values in this interval not displayed.   Lipid Panel:  Recent Labs  Lab 12/31/2020 1625 12/24/20 0514 12/20/2020 0522  CHOL 115  --   --   TRIG 68   < > 200*  HDL 41  --   --   CHOLHDL 2.8  --   --   VLDL 14  --   --   LDLCALC 60  --   --    < > = values in this interval not displayed.   HgbA1c:  Recent Labs  Lab 12/22/2020 1625  HGBA1C 5.4   IMAGING past 24 hours  CT HEAD WO CONTRAST  Result Date: 12/24/2020 IMPRESSION:  1. Stable non contrast CT appearance of the brain since yesterday. Multifocal chronic ischemia with no acute or evolving infarct by CT.  2. No acute intracranial hemorrhage. Stable mixed density left subdural  hematoma.    MR BRAIN WO CONTRAST  Result Date: 01/01/2021 IMPRESSION: Multiple small areas of acute infarct in the right cerebral hemisphere. Interval resolution of small infarct in the left hippocampus. Loss of flow void right internal carotid artery compatible with interval occlusion Subacute subdural hematoma left 7 mm, unchanged.   CTA head/neck   Result Date: 12/15/2020 IMPRESSION:  CTA neck:  1. The cervical right ICA becomes occluded or near occluded at its proximal to mid aspect. The vessel remains occluded or near occluded throughout the remainder of the neck.  2. Left CCA and ICA patent within the neck with less than 50% stenosis.  3. Vertebral arteries patent within the neck bilaterally.  4. Moderate/severe stenosis at the origin of the dominant left vertebral artery.   CTA head:  1. The right ICA remains occluded or near occluded intracranially throughout the siphon region. There is reconstitution of enhancement within this vessel at the level of the supraclinoid segment.  2. High-grade focal stenosis within an inferior division proximal M2 right MCA branch vessel.  3. Atherosclerotic irregularity of additional proximal to mid right M2 MCA branch vessels. Most notably, there is segmental high-grade stenosis within a superior division mid M2 right MCA branch.  4. 2 mm medially projecting vascular protrusion arising from the supraclinoid right ICA. This may reflect an aneurysm, infundibulum or the origin of an otherwise occluded right posterior communicating artery.   CEREBRAL PERFUSION W CONTRAST CT perfusion head: The perfusion software identifies no core infarct. The small acute infarcts demonstrated on the brain MRI performed earlier today are not detected. 109 mL region of hypoperfusion, predominantly within the right cerebral hemisphere.  DG CHEST PORT 1 VIEW Result Date: 01/02/2021 IMPRESSION: Endotracheal tube 7 cm above the carina. No acute cardiopulmonary disease.    PHYSICAL EXAM GENERAL: Elderly male resting in bed on nasal cannula in NAD.  HEENT: - Normocephalic and atraumatic, dry mm,  LUNGS - Normal respiratory effort. SaO2 stable.  CV - A fib on tele ABDOMEN - Soft, nontender Ext: warm, well perfused  NEURO:  Mental Status: Keeps eyes closed, somnolent. Arouses with verbal stimuli briefly to follow simple commands. Able to state his name and month is March. Replies he doesn't know where he is. Brief one or two word verbal responses.  Speech is clear. Names 1/1 objects.  Cranial Nerves:  II: PERR 2 mm/brisk. Unable to assess visual fields III, IV, VI: Does not follow or track across midline to the left. Gazes to the right. V: Does not blink to threat of the  left side VII:  left facial droop. VIII: Hearing intact to voice IX, X: unable to assess due to intubated and sedated XI: Lifts LUE antigravity to command.  XII: Head resting at midline. turned to the right.  Motor: At least 3/5 in RLE and good grip in the R hand. 2/5 in LUE/ LLE to noxious stimuli Coordination:Unable to assess due to AMS Gait- deferred  ASSESSMENT/PLAN Cesar [name] is a 87 y.o. male with history of A. fib, CVA, seizure disorder, history of DVT, hyperlipidemia, chronic kidney disease stage III, severe AS status post TAVR in January 2022, OSA not on CPAP, nonobstructive CAD presenting with confusion. Was found to have multiple small areas of acute infarct in the right cerebral hemisphere and right ICA and right MCA occlusions.  He underwent emergent mechanical thrombectomy of right ICA terminus and right MCA occlusions achieving a TICI 3 revascularization     Stroke:  right MCA and left CR, left MCA/PCA punctate scattered infarcts with right ICA and proximal MCA occlusions s/p IR with TICI 3 revasculariztion. These events occurred in the setting of known atrial fibrillation off anticoagulation due to recent burr hole surgery for SDH.    Code Stroke CT head no acute  intracranial hemorrhage  CTA head right ICA remains occluded, High-grade stenosis of M2 right MCA branch vessel.   CTA neck cervical right ICA becomes occluded or near occluded at its proximal to mid aspect. The vessel remains occluded or near occluded throughout the remainder of the neck. Left CCA and ICA patent within the neck with less than 50% stenosis.   CT perfusion no core infarct but large penumbra.  CT head repeat no bleeding  MRI  Multiple small areas of acute infarct in the right cerebral hemisphere. As well as left CR and left MCA/PCA punctate infarcts  MRA Degraded by motion, Right ICA is now patent. Suspect moderate narrowing at the Clinoid  2D Echo: EF 60-65%, Left atrial size was severely dilated. Mild LVH. Left ventricular diastolic  function could not be evaluated due to atrial fibrillation.  LDL 60  HgbA1c 5.4  VTE prophylaxis - heparin IV infusion  aspirin 81 mg daily prior to admission, now on heparin IV.   Recurrent right MCA infarct due to right M1 occlusion s/p IR with TICI3   Acute onset AMS, right gaze and left hemiplegia  CT head no acute change.  CTA head and neck showed right M1 occlusion and short segment of right P2 high grade stenosis vs. Occlusion.   CTP showed large penumbra.  S/p IR 3/23 with TICI3  MRI Interval development of multiple new acute ischemic infarcts involving the right cerebral hemisphere, primarily involving the right temporal lobe, consistent with recent right M1 occlusion.  MRA Interval revascularization of previously identified right M1 occlusion, with wide patency of the right MCA now seen.  Now on heparin IV  Therapy recommendations:  Pending overall stability and progress, very eventful last few days s/p 3 procedures.   Disposition:  TBD  Right axillary A occlusion with suspected cardiac embolus  Right axillary artery occluded with partial thrombus on doppler  S/p LUE thrombectomy 3/24 with Dr. Carlis Abbott   Now  RUE warm and pulse intact  Incision bleeding 3/25 - VVS on board  Hold heparin gtt for 6 hours  Seizure history  11/2020 due to SDH, discharged with depakote 500 bid  This admission Depakote level 44->41->65->38->54  Loaded with Depakote 1500mg  x3  Continue Depakote 500mg  bid -> Q8h  EEG: mild-moderate diffuse encephalopathy, with possible/not definitive increased areas of focal neuronal dysfunction in the right (artifact is more favored) and possibly in the anterior regions (bilaterally).  There are no clear epileptiform abnormalities.  Bilateral lower extremity DVTs  History of blood clots, prior DVT 2006  This admission LE venous doppler - R posterior tibial and peroneal veins and L peroneal veins DVT.  Heparin infusion per stroke protocol - No bolus per stroke protocol. Goal level Heparin level 0.3-0.5 units/ml  Atrial fibrillation  Previously on coumadin -> eliquis -> Xarelto, held due to SDH s/p craniotomy 11/2020  On heparin IV infusion  Monitor for HIT  Hgb 10.7->9.9->9.7->10->8.9  Platelet 129->142->112  Echo EF 60-65%  Rate controlled  SDH  11/2020 left SDH status post evacuation  Anticoagulation was on hold, plan to resume in 6 weeks  This admission CT head left SDH much improved  Discussed with NSG Dr. Venetia Constable, okay to start heparin IV with close monitoring  History of stroke  03/2019 admitted for left-sided weakness, left hemianopia, MRI showed only incidental small left cerebellar infarct.  Diagnosed with right brain TIA. CT showed no acute finding, but old left MCA infarct. MCA showed right siphon mild to moderate stenosis.  Carotid Doppler negative.  EF 60 to 65%.  LDL 94, A1c 5.3.  Continued on Coumadin and Lipitor 80   Dysphagia  NPO now  S/p cortrak placement  On TF and IVF.    Urosepsis  Febrile on admission 101.9 -> 101.2  WBC 11.7 -> 9.2->7->7.9  UA: +nitrite, +LE, WBC >50  Urine culture: ecoli  Recent ecoli uti  (12/06/20)  Received vancomycin IV x1, was on cefepime -> now off  Hypertension->hypotension  Home meds:  Amlodipine 5mg  daily  BP soft . Albumin Q6h x 4 . On TF and IVF  Hyperlipidemia  Home meds:  Atorvastatin 80mg  daily, resumed in hospital  LDL 60, goal < 70  Continue statin at discharge  CKD Stage III  Baseline sCr 1.3-1.5.  Cre now 1.18->1.16->1.18->1.41->1.82  S/p contrast  x 3 in past 3 days  Now on albumin, TF and IVF  Other Stroke Risk Factors  Advanced Age >/= 38   Overweight, Body mass index is 29.63 kg/m., recommend weight loss, diet and exercise as appropriate   Hx stroke  Family hx stroke (father stroke)  Coronary artery disease  Abdominal aortic aneurysm  TAVR 10/2020  Other Active Problems  GERD: protonix  BPH: proscar  Hospital day # 4   This plan of care was directed by Dr. Erlinda Hong.  Hetty Blend, NP-C   ATTENDING NOTE: I reviewed above note and agree with the assessment and plan. Pt was seen and examined.   Patient daughter and son at bedside.  Patient drowsy sleepy lethargic, intermittently open eyes on voice and pain stimulation, however, not able to follow simple commands.  Has intermittent spontaneous speech but severe dysarthria with intangible words.  Not able to answer orientation questions.  Eyes midposition, no tracking, not blinking to visual threat bilaterally.  Mild left facial droop, spontaneous movement of right upper extremity, but not left upper extremity.  Withdrawal bilateral lower extremity on pain, right more than left. Sensation, coordination and gait not tested.  Pt post right upper extremity thrombectomy yesterday, now right arm warm and intact.  Has right arm incision bleeding, VVS on board, will hold heparin IV for 6 hours.  Did not have swallow, cortrak placed and on tube feeding.  Overnight spiking fever, this morning afebrile,  CCM on board, off antibiotics, continue monitoring.  Soft BP, on albumin,  tube feeding and IV fluid.  PT/OT recommend CIR.  For detailed assessment and plan, please refer to above as I have made changes wherever appropriate.   Rosalin Hawking, MD PhD Stroke Neurology 12/27/2020 7:17 PM  This patient is critically ill due to right MCA stroke status post thrombectomy x2, right upper extremity Axillary artery occlusion status post thrombectomy, persistent A. fib, dysphagia, SDH, seizure and at significant risk of neurological worsening, death form heart failure, recurrent stroke, hemorrhagic conversion, bleeding from heparin IV, recurrent SDH, aspiration. This patient's care requires constant monitoring of vital signs, hemodynamics, respiratory and cardiac monitoring, review of multiple databases, neurological assessment, discussion with family, other specialists and medical decision making of high complexity. I spent 40 minutes of neurocritical care time in the care of this patient. I had long discussion with daughter and son at bedside, updated pt current condition, treatment plan and potential prognosis, and answered all the questions.  They expressed understanding and appreciation.     To contact Stroke Continuity provider, please refer to [email or web address] After hours, contact General Neurology

## 2020-12-27 NOTE — Progress Notes (Addendum)
  Progress Note    12/27/2020 7:20 AM 1 Day Post-Op  Subjective:  Extubated-sleepy and does not follow commands  Tm 101.2 now 100  Vitals:   12/27/20 0600 12/27/20 0700  BP: (!) 114/52 (!) 126/58  Pulse: (!) 49 (!) 58  Resp: (!) 23 19  Temp:    SpO2: 100% 100%    Physical Exam: Cardiac:  irregular Lungs:  Non labored Incisions:  Clean and dry Extremities:  Easily palpable right radial pulse; right hand is warm and well perfused.  Right forearm is soft.   CBC    Component Value Date/Time   WBC 7.9 12/27/2020 0415   RBC 2.80 (L) 12/27/2020 0415   HGB 8.9 (L) 12/27/2020 0415   HCT 28.1 (L) 12/27/2020 0415   PLT 112 (L) 12/27/2020 0415   MCV 100.4 (H) 12/27/2020 0415   MCH 31.8 12/27/2020 0415   MCHC 31.7 12/27/2020 0415   RDW 14.2 12/27/2020 0415   LYMPHSABS 0.6 (L) 12/27/2020 0415   MONOABS 1.6 (H) 12/27/2020 0415   EOSABS 0.0 12/27/2020 0415   BASOSABS 0.0 12/27/2020 0415    BMET    Component Value Date/Time   NA 140 12/27/2020 0415   NA 141 10/30/2020 1520   K 4.3 12/27/2020 0415   CL 112 (H) 12/27/2020 0415   CO2 21 (L) 12/27/2020 0415   GLUCOSE 93 12/27/2020 0415   BUN 24 (H) 12/27/2020 0415   BUN 28 (H) 10/30/2020 1520   CREATININE 1.82 (H) 12/27/2020 0415   CALCIUM 7.8 (L) 12/27/2020 0415   GFRNONAA 35 (L) 12/27/2020 0415   GFRAA 57 (L) 10/30/2020 1520    INR    Component Value Date/Time   INR 1.3 (H) 11/19/2020 1751     Intake/Output Summary (Last 24 hours) at 12/27/2020 0720 Last data filed at 12/27/2020 0600 Gross per 24 hour  Intake 2071.46 ml  Output 1025 ml  Net 1046.46 ml     Assessment:  87 y.o. male is s/p:  Right upper extremity thrombectomy including the right radial, ulnar, brachial, and axillary artery  1 Day Post-Op  Plan: -pt with easily palpable right radial pulse.  Right hand is warm and right forearm is soft.   -pt extubated but sleepy this morning.  Moved right arm spontaneously but not to command.  -DVT  prophylaxis:  Heparin gtt   Leontine Locket, PA-C [facility] [phone] 12/27/2020 7:20 AM  I have seen and evaluated the patient. I agree with the PA note as documented above.  Postop day 1 status post right upper extremity thrombectomy including the radial, ulnar, brachial and axillary artery.  Has a palpable radial pulse at the wrist and right hand looks well perfused.  Incision looks good.  Continue heparin.  Vascular will follow.  Marty Heck, MD [facility] [place] Office: [phone]

## 2020-12-27 NOTE — Progress Notes (Signed)
RUE thrombectomy incision continues oozing blood, Dr. Stanford Breed paged and took verbal order to pause heparin until he comes to assess.  OK to pause heparin gtt per Dr. Erlinda Hong as well.

## 2020-12-27 NOTE — Progress Notes (Signed)
NAME:  Cesar [name], MRN:  [id number], DOB:  [birth year], LOS: 4 ADMISSION DATE:  12/16/2020, CONSULTATION DATE:  .12/27/20 REFERRING MD:  Cheral Marker, CHIEF COMPLAINT:  Weakness   History of Present Illness:  87 y.o. M with PMH significant for AAA, stage III CKD, OSA, seizures, OSA, severe AS s/p TAVR 10/22/20, permanent Afib who has recently admitted for TAVR and discharged on [place] and Eliquis, re-admitted 2/15 with large, chronic subdural hematoma after falls and underwent bur hole drainage with drain placement.  His anti-coagulation was held until 2/21  and he was discharged to rehab 2/28 ( [facility])  and then to home on 3/17.      He then presented to the ED on 3/21 with generalized weakness and fever to 101.9 and was found to have UTI and bilateral DVT's MRI showed possible internal carotid artery occlusion on the right and right MCA CVA.   His initial head CT did not show acute changes, but throughout ED course he became increasingly confused and had slurred speech. Pt. Also has a seizure disorder , EEG did not show seizure activity Depakote was subtherapeutic, and he was loaded.  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   Significant Hospital Events: Including procedures, antibiotic start and stop dates in addition to other pertinent  events   . Admit to hospitalists, worsening neuro status and found to have occluded R ICA, taken to IR and then ICU, PCCM consult . 3/22 extubated. Cefepime for e coli uti  . 3/23 remains stable following extubation, not on any vasoactive infusions. PCCM to sign off. Found unresponsive and slumped over.  CTA head/neck showed R M1 occlusion and R P2 high grade stenosis vs occlusion, CTP large penumbra taken back emergently to NIR w/complete revascularization of occluded R MCA; returns intubated  Interim History / Subjective:  Went for RUE thrombectomy yesterday Extubated late afternoon  NAEO   I took him off of 2LNC this morning, his Sats remain 100%    Objective   Blood pressure (!) 126/58, pulse (!) 58, temperature 100 F (37.8 C), temperature source Oral, resp. rate 19, height 5\' 9"  (1.753 m), weight 91 kg, SpO2 100 %.    Vent Mode: PRVC FiO2 (%):  [40 %] 40 % Set Rate:  [15 bmp] 15 bmp Vt Set:  [560 mL] 560 mL PEEP:  [5 cmH20] 5 cmH20 Pressure Support:  [5 cmH20] 5 cmH20 Plateau Pressure:  [11 cmH20-14 cmH20] 14 cmH20   Intake/Output Summary (Last 24 hours) at 12/27/2020 0708 Last data filed at 12/27/2020 0600 Gross per 24 hour  Intake 2071.46 ml  Output 1025 ml  Net 1046.46 ml   Filed Weights   12/22/2020 0443 12/16/2020 2010  Weight: 86.2 kg 91 kg   General: Chronically and acutely ill appearing elderly M reclined in bed NAD  HEENT: [place]. Anicteric sclera. Trachea midline. Pink mm  Neuro: Awakens to voice. L facial droop. Moving BUE BLE to command.  BLE about 2/5. RUE 4/5 LUE 2/5.  CV: rr s1s2 2+ radial pulses  PULM:  Symmetrical chest expansion, even and unlabored on RA. Some intermittent upper airway gurgling which improves with coughing and/or suction.  GI: soft round clear yellow urine, condom cath  Extremities: no acute joint deformity. No cyanosis or clubbing  Skin: c/d/w no rash. RUE incision site with one small area of bleeding-- improved with pressure.    Labs/imaging that I havepersonally reviewed   3/21 SARS neg 3/21 MRSA neg 3/21>>Urine Cx Ecoli 90k  3/21>> Blood Cx, pending  3/20 vanc 3/20 cefepime >> 3/22 3/21 cefazolin; 3/23  3/25 CBC BMP -- Cr bump to 1.82. Hgb 8.9 from [facility] Problem list   Respiratory failure requiring intubation 3/22; 3/24   Assessment & Plan:   R MCA L CR L MCA/PCA punctate infarcts R ICA occlusion s/p thrombectomy P 3/21 AMS prompting CT Head R ICA s/p IR thrombectomy with complete revascularization of occluded ICA to the terminus and RT MCA x 1 pass.  unresponsive again 3/23, CTH CTA c/w with R MCA occlusion s/p NIR with complete revascularization Recent L SDH s/p burr hole (11/2020)  -stable on MRI 3/24 -- anticoag has been held in this setting  Seizure disorder   -vpa levels a little sub therapeutic  P - per NIR/ Neuro - cleviprex for SBP goal < 160 - VPA per neuro  - on heparin gtt (started 3/24) -- dosing per pharmacist   Acute encephalopathy  -in setting of CVAs, AKI, hx sedation for mechanical ventilation, possible component of ICU delirium P -minimize CNS depressing meds as able -CVA as above -delirium precautions   At risk for respiratory failure in setting of CVA/encephalopathy -extubated 3/24 and is tolerating  -off supplemental O2 P -aggressive pulm hygiene and suctioning -mobility/ bed in chair   Ischemic R upper extremity s/p RUE thrombectomy 3/24 -suspected cardiac embolus in setting of afib off anticoag due to recent SDH P -vvs following -- post op per vasc -fq neurovasc checks  -as above, on hep gtt   Severe AS s/p TAVR Afib, chronic (was off anticoag in setting of recent SDH)  BLD DVT (12/22/2020)  -previously on eliquis, had been held since mid Feb 2022 in setting of SDH  CHAD2S2VASc score 4 (4.8-6.7%). P - ICU monitoring - hep gtt  - consider IVC filter   E. Coli UTI Admitted with fever to Tmax 101.9, leukocytosis, lactic acidosis; UA with  +nitrite + leuks, > 50 RBCs and rare bacteria. UCx pending, recent culture 12/06/20 +E.coli (pansensitive).  Urine Cx + for gram - rods, susceptibilities pending P - s/ p 3d cefepime  - foley is out   AKI on CKD III  Baseline sCr 1.3-1.5 Acute component likely contrast related, still making urine  P -Trend renal indices, UOP   HLD -lipitor   BPH Acute urinary retention - improved  -proscar (home med)  -continue condom cath   GERD -Protonix   Physical deconditioning  Recently discharged from acute rehab 12/19/2020. - PT/OT/SLP as clinically appropriate  L/T/D -CVC- dc -art line - dc -condom cath - continue    Best practice (evaluated daily)  Diet:  NPO - SLP consult  Pain/Anxiety/Delirium protocol (if indicated): No VAP protocol (if indicated): Not indicated DVT prophylaxis: Systemic AC  GI prophylaxis: PPI Glucose control:  SSI Yes Central venous access:  N/A Arterial line:  Yes, and it is no longer needed  Foley:  Yes, and it is  no longer needed  Mobility:  OOB -- pt   PT consulted: Yes Last date of multidisciplinary goals of care discussion [per primary] Code Status:  full code Disposition: ICU  Labs   CBC: Recent Labs  Lab 12/21/2020 0218 12/03/2020 0732 12/18/2020 2115 12/24/20 0514 12/21/2020 0523 12/11/2020 2034 12/07/2020 0522 12/27/20 0415  WBC 15.3*   < > 11.7* 9.2 7.0  --  7.3 7.9  NEUTROABS 13.1*  --  9.4* 7.4  --   --  6.2 5.5  HGB 11.7*   < > 10.7* 9.9* 9.7* 7.1* 10.0* 8.9*  HCT 36.7*   < > 32.9* 30.0* 29.8* 21.0* 29.3* 28.1*  MCV 101.9*   < > 98.5 98.4 98.7  --  98.0 100.4*  PLT 162   < > 137* 126* 129*  --  142* 112*   < > = values in this interval not displayed.    Basic Metabolic Panel: Recent Labs  Lab 12/22/2020 2115 12/24/20 0514 12/20/2020 0523 12/24/2020 2034 12/13/2020 0522 12/27/20 0415  NA 136 139 136 141 138 140  K 3.9 4.1 3.9 3.5 3.8 4.3  CL 109 110 109  --  109 112*  CO2 21* 21* 23  --  20* 21*  GLUCOSE 100* 105* 93  --  117* 93   BUN 21 22 21   --  18 24*  CREATININE 1.18 1.16 1.18  --  1.41* 1.82*  CALCIUM 8.0* 8.1* 7.8*  --  7.8* 7.8*  MG  --   --  1.9  --   --  1.9   GFR: Estimated Creatinine Clearance: 30.7 mL/min (A) (by C-G formula based on SCr of 1.82 mg/dL (H)). Recent Labs  Lab 12/16/2020 0211 12/29/2020 0218 12/09/2020 0732 12/22/2020 2115 12/24/20 0514 12/16/2020 0523 12/27/2020 0522 12/27/20 0415  WBC  --    < > 15.1*   < > 9.2 7.0 7.3 7.9  LATICACIDVEN 2.0*  --  1.9  --   --   --   --   --    < > = values in this interval not displayed.    Liver Function Tests: Recent Labs  Lab 12/28/2020 0218  AST 27  ALT 17  ALKPHOS 42  BILITOT 0.8  PROT 5.9*  ALBUMIN 2.6*   No results for input(s): LIPASE, AMYLASE in the last 168 hours. Recent Labs  Lab 12/14/2020 0732  AMMONIA 14    ABG    Component Value Date/Time   PHART 7.392 12/06/2020 2034   PCO2ART 32.2 12/13/2020 2034   PO2ART 176 (H) 12/18/2020 2034   HCO3 19.7 (L) 12/31/2020 2034   TCO2 21 (L) 01/01/2021 2034   ACIDBASEDEF 5.0 (H) 12/20/2020 2034   O2SAT 100.0 12/10/2020 2034     Coagulation Profile: No results for input(s): INR, PROTIME in the last 168 hours.  Cardiac Enzymes: No results for input(s): CKTOTAL, CKMB, CKMBINDEX, TROPONINI in the last 168 hours.  HbA1C: Hgb A1c MFr Bld  Date/Time Value Ref Range Status  12/11/2020 04:25 PM 5.4 4.8 - 5.6 % Final    Comment:    (NOTE) Pre diabetes:          5.7%-6.4%  Diabetes:              >6.4%  Glycemic control for   <7.0% adults with diabetes   10/22/2020 04:56 AM 5.1 4.8 - 5.6 % Final    Comment:    (NOTE) Pre diabetes:          5.7%-6.4%  Diabetes:              >6.4%  Glycemic control for   <7.0% adults with diabetes     CBG: Recent Labs  Lab 12/19/2020 0713  GLUCAP 107*    CRITICAL CARE Performed by: Cristal Generous   Total critical care time: 48 minutes  Critical care time was exclusive of separately billable procedures and treating other  patients.  Critical care was necessary to treat or prevent imminent or life-threatening deterioration.  Critical care was time spent personally by me on the following activities: development of treatment plan with patient and/or surrogate as well as nursing, discussions with consultants, evaluation of patient's response to treatment, examination of patient, obtaining history from patient or surrogate, ordering and performing treatments and interventions, ordering and review of laboratory studies, ordering and review of radiographic studies, pulse oximetry and re-evaluation of patient's condition.   Eliseo Gum MSN, AGACNP-BC [place] for pager information  12/27/2020, 11:34 AM

## 2020-12-27 NOTE — Progress Notes (Signed)
HR <60, Cardizem turned off, E-link aware, will continue to monitor.

## 2020-12-27 NOTE — Progress Notes (Signed)
Inpatient Rehabilitation Admissions Coordinator    I will place order for rehab consult to assess candidacy for readmit. Recent d/c home from [facility]. 12/19/2020.  Danne Baxter, RN, MSN Rehab Admissions Coordinator [phone] 12/27/2020 3:29 PM

## 2020-12-27 NOTE — Anesthesia Postprocedure Evaluation (Signed)
Anesthesia Post Note  Patient: Cesar [name]  Procedure(s) Performed: THROMBECTOMY RIGHT UPPER EXTREMITY (Right Arm Upper)     Patient location during evaluation: PACU Anesthesia Type: General Level of consciousness: patient remains intubated per anesthesia plan Pain management: pain level controlled Vital Signs Assessment: post-procedure vital signs reviewed and stable Respiratory status: patient remains intubated per anesthesia plan Cardiovascular status: stable Anesthetic complications: no   No complications documented.  Last Vitals:  Vitals:   12/27/20 0900 12/27/20 1200  BP: (!) 141/60   Pulse: 69   Resp: (!) 21   Temp:  36.8 C  SpO2: 100%     Last Pain:  Vitals:   12/27/20 1200  TempSrc: Oral  PainSc:                  Nolon Nations

## 2020-12-28 ENCOUNTER — Inpatient Hospital Stay (HOSPITAL_COMMUNITY): Payer: Medicare Other

## 2020-12-28 DIAGNOSIS — S065X9A Traumatic subdural hemorrhage with loss of consciousness of unspecified duration, initial encounter: Secondary | ICD-10-CM | POA: Diagnosis not present

## 2020-12-28 DIAGNOSIS — I63411 Cerebral infarction due to embolism of right middle cerebral artery: Secondary | ICD-10-CM | POA: Diagnosis not present

## 2020-12-28 DIAGNOSIS — J9601 Acute respiratory failure with hypoxia: Secondary | ICD-10-CM | POA: Diagnosis not present

## 2020-12-28 LAB — URINALYSIS, ROUTINE W REFLEX MICROSCOPIC
Bacteria, UA: NONE SEEN
Bilirubin Urine: NEGATIVE
Glucose, UA: NEGATIVE mg/dL
Ketones, ur: NEGATIVE mg/dL
Leukocytes,Ua: NEGATIVE
Nitrite: NEGATIVE
Protein, ur: 30 mg/dL — AB
Specific Gravity, Urine: 1.015 (ref 1.005–1.030)
pH: 5 (ref 5.0–8.0)

## 2020-12-28 LAB — HEPARIN LEVEL (UNFRACTIONATED)
Heparin Unfractionated: 0.23 IU/mL — ABNORMAL LOW (ref 0.30–0.70)
Heparin Unfractionated: 0.34 IU/mL (ref 0.30–0.70)

## 2020-12-28 LAB — PHOSPHORUS
Phosphorus: 2.7 mg/dL (ref 2.5–4.6)
Phosphorus: 2.1 mg/dL — ABNORMAL LOW (ref 2.5–4.6)

## 2020-12-28 LAB — CBC WITH DIFFERENTIAL/PLATELET
Abs Immature Granulocytes: 0.06 10*3/uL (ref 0.00–0.07)
Basophils Absolute: 0 10*3/uL (ref 0.0–0.1)
Basophils Relative: 1 %
Eosinophils Absolute: 0.1 10*3/uL (ref 0.0–0.5)
Eosinophils Relative: 1 %
HCT: 24.7 % — ABNORMAL LOW (ref 39.0–52.0)
Hemoglobin: 7.9 g/dL — ABNORMAL LOW (ref 13.0–17.0)
Immature Granulocytes: 1 %
Lymphocytes Relative: 13 %
Lymphs Abs: 0.8 10*3/uL (ref 0.7–4.0)
MCH: 32.1 pg (ref 26.0–34.0)
MCHC: 32 g/dL (ref 30.0–36.0)
MCV: 100.4 fL — ABNORMAL HIGH (ref 80.0–100.0)
Monocytes Absolute: 0.9 10*3/uL (ref 0.1–1.0)
Monocytes Relative: 15 %
Neutro Abs: 4.4 10*3/uL (ref 1.7–7.7)
Neutrophils Relative %: 69 %
Platelets: 100 10*3/uL — ABNORMAL LOW (ref 150–400)
RBC: 2.46 MIL/uL — ABNORMAL LOW (ref 4.22–5.81)
RDW: 14.5 % (ref 11.5–15.5)
WBC: 6.2 10*3/uL (ref 4.0–10.5)
nRBC: 0 % (ref 0.0–0.2)

## 2020-12-28 LAB — GLUCOSE, CAPILLARY
Glucose-Capillary: 130 mg/dL — ABNORMAL HIGH (ref 70–99)
Glucose-Capillary: 108 mg/dL — ABNORMAL HIGH (ref 70–99)
Glucose-Capillary: 92 mg/dL (ref 70–99)
Glucose-Capillary: 121 mg/dL — ABNORMAL HIGH (ref 70–99)
Glucose-Capillary: 111 mg/dL — ABNORMAL HIGH (ref 70–99)
Glucose-Capillary: 131 mg/dL — ABNORMAL HIGH (ref 70–99)

## 2020-12-28 LAB — BASIC METABOLIC PANEL
Anion gap: 9 (ref 5–15)
BUN: 36 mg/dL — ABNORMAL HIGH (ref 8–23)
CO2: 21 mmol/L — ABNORMAL LOW (ref 22–32)
Calcium: 7.9 mg/dL — ABNORMAL LOW (ref 8.9–10.3)
Chloride: 114 mmol/L — ABNORMAL HIGH (ref 98–111)
Creatinine, Ser: 1.68 mg/dL — ABNORMAL HIGH (ref 0.61–1.24)
GFR, Estimated: 39 mL/min — ABNORMAL LOW (ref >60)
Glucose, Bld: 132 mg/dL — ABNORMAL HIGH (ref 70–99)
Potassium: 3.8 mmol/L (ref 3.5–5.1)
Sodium: 144 mmol/L (ref 135–145)

## 2020-12-28 LAB — VALPROIC ACID LEVEL: Valproic Acid Lvl: 85 ug/mL (ref 50.0–100.0)

## 2020-12-28 LAB — MAGNESIUM
Magnesium: 2.5 mg/dL — ABNORMAL HIGH (ref 1.7–2.4)
Magnesium: 2.4 mg/dL (ref 1.7–2.4)

## 2020-12-28 LAB — CULTURE, BLOOD (ROUTINE X 2)
Culture: NO GROWTH
Culture: NO GROWTH
Special Requests: ADEQUATE
Special Requests: ADEQUATE

## 2020-12-28 MED ORDER — POTASSIUM CHLORIDE 20 MEQ PO PACK
40.0000 meq | PACK | Freq: Once | ORAL | Status: AC
  Administered 2020-12-28: 40 meq
  Filled 2020-12-28: qty 2

## 2020-12-28 MED ORDER — HYDRALAZINE HCL 20 MG/ML IJ SOLN: 10.0000 mg | INTRAMUSCULAR | Status: DC | PRN

## 2020-12-28 MED ORDER — FREE WATER
200.0000 mL | Status: DC
  Administered 2020-12-28 – 2020-12-30 (×13): 200 mL

## 2020-12-28 MED ORDER — LABETALOL HCL 5 MG/ML IV SOLN
10.0000 mg | INTRAVENOUS | Status: DC | PRN
  Administered 2020-12-28 – 2020-12-29 (×4): 10 mg via INTRAVENOUS
  Filled 2020-12-28 (×4): qty 4

## 2020-12-28 MED ORDER — IOHEXOL 350 MG/ML SOLN
75.0000 mL | Freq: Once | INTRAVENOUS | Status: AC | PRN
  Administered 2020-12-28: 75 mL via INTRAVENOUS

## 2020-12-28 NOTE — Significant Event (Signed)
Called by RN regarding neuro change-not moving left as much as before. Please see progress note for details with further imaging and examination.  -- Amie Portland, MD Neurologist

## 2020-12-28 NOTE — Progress Notes (Signed)
CXR  IMPRESSION: 1. Central vascular congestion.  No acute airspace disease.   Continue recs as before.  Amie Portland MD

## 2020-12-28 NOTE — Progress Notes (Signed)
ANTICOAGULATION CONSULT NOTE  Pharmacy Consult for heparin Indication: acute bilateral DVT, stroke  Heparin Dosing Weight: 91 kg  Labs: Recent Labs    12/29/2020 0522 12/15/2020 1730 12/27/20 0415 12/28/20 0400 12/28/20 1428  HGB 10.0*  --  8.9* 7.9*  --   HCT 29.3*  --  28.1* 24.7*  --   PLT 142*  --  112* 100*  --   HEPARINUNFRC <0.10*   < > 0.59 0.23* 0.34  CREATININE 1.41*  --  1.82* 1.68*  --    < > = values in this interval not displayed.    Assessment: 19 yom s/p bur hole drainage of complex L chronic SDH last month presenting with acute CVA s/p IR for cerebral arteriogram, emergent mechanical thrombectomy on 3/21. Patient also found to have acute bilateral DVT. PTA apixababn has been held since 11/21/20 with SDH/crani with plan to hold x 6 weeks.  Neurosurgery cleared to begin heparin per stroke protocol on 3/22. Neurology and CCM in agreement. Patient did experience platelet drop to 30s in rehab on heparin SQ with improvement once d/c'd. HIT antibody resulted as negative. Per discussion with CCM, will cautiously start heparin and monitor plt trend for now. SDH appears to be almost fully resolved per Neurosurgery.  Patient went for revascularization 3/23 and heparin was held overnight. Orders 3/24 to resume heparin drip.  3/26 PM update:  -Heparin level now therapeutic after slight rate increase this AM. Heparin was held for a few hours yesterday evening for thrombectomy site bleeding - no further bleeding issues reported. Hg trend down to 7.9 and plt down to 100 - watch closely. SCr trended back down today.  Goal of Therapy:  Heparin level 0.3-0.5 units/ml, no bolus per stroke protocol Monitor platelets by anticoagulation protocol: Yes   Plan:  Continue heparin at 1100 units/hr Monitor daily heparin level and CBC, s/sx bleeding   Arturo Morton, PharmD, BCPS Please check AMION for all [place] contact numbers Clinical Pharmacist 12/28/2020 3:15 PM

## 2020-12-28 NOTE — Progress Notes (Addendum)
STROKE TEAM PROGRESS NOTE    HPI/Signifcant events: Patient has previous TIA and incidental left cerebellum infarct in 03/2019.  At that time, CT old left MCA infarct, MRA right siphon mild to moderate stenosis, carotid Doppler negative, EF 60 to 65%, LDL 94 and A1c 5.3.  Patient continued on Coumadin and Lipitor 80.  Patient admitted 11/2020 for left large SDH status post burr hole evacuation.  Anticoagulation was on hold.  Patient developed worsening right-sided weakness and aphasia, EEG negative, concerning for seizure activity put on Depakote 1000 mg nightly.  This admission presentation: patient admitted for confusion, fever, urosepsis and bilateral DVT.  However patient developed acute worsening of speech and left-sided weakness in the ED.  Code stroke activated, CT showed left SDH much improved, stable bilateral frontal encephalomalacia and BG lacunar infarcts.  MRI showed small multiple punctate scattered infarcts at right MCA, left CR, left MCA/PCA.  However, found to have an no flow of right ICA.  CT head and neck done showed occlusion of right ICA from bulb to terminal and proximal MCA.  CT perfusion showed large penumbra.  Underwent thrombectomy with TICI3 reperfusion.  CT repeat no bleeding.  3/23 evening  S/p repeated stroke work up late afternoon after patient was found up in chair slumped over and not responsive. Noted to have right gaze and left hemiplegia. CT head no acute change. The pt at that time was able to open eyes on voice stimulation but still not verbal or following commands, still had left hemiplegia.  CTA head and neck showed right M1 occlusion and short segment of right P2 high grade stenosis vs. Occlusion. CTP showed large penumbra. He was intubated and taken emergently to IR by Dr. Estanislado Pandy for agram/thrombectomy.  Returned to ICU intubated and sedated. Bilat LE DVTs noted 3/23.   3/24 Right axillary artery occlussion with partial thrombus noted early am today. Vascular  surgery called and taken to OR today for emergent thrombectomy.  Patient remains intubated and sedated on precedex. Remains weak in the left hemibody and withdraws right hemibody.   INTERVAL HISTORY Extubated 3/24 1600 postop. A fib with RVR on cardizem gtt briefly. Now bradycardic at times. Febrile to 101.2 (CCM plans to monitor). Creatinine trending up to 1.8 in setting of 3 procedures w/contrast.   Somnolent, arouses easily to verbal stimuli. Following simple commands. Moving all extremities. Poor cough effort.   Vitals:   12/28/20 0900 12/28/20 1000 12/28/20 1100 12/28/20 1200  BP: 139/68 (!) 165/69 (!) 170/64 139/84  Pulse: (!) 36 73 75 72  Resp: (!) 25 18 (!) 21 19  Temp:      TempSrc:      SpO2: (!) 89% 96% 98% 99%  Weight:      Height:       CBC:  Recent Labs  Lab 12/27/20 0415 12/28/20 0400  WBC 7.9 6.2  NEUTROABS 5.5 4.4  HGB 8.9* 7.9*  HCT 28.1* 24.7*  MCV 100.4* 100.4*  PLT 112* 109*   Basic Metabolic Panel:  Recent Labs  Lab 12/27/20 0415 12/27/20 1259 12/27/20 1820 12/28/20 0400  NA 140  --   --  144  K 4.3  --   --  3.8  CL 112*  --   --  114*  CO2 21*  --   --  21*  GLUCOSE 93  --   --  132*  BUN 24*  --   --  36*  CREATININE 1.82*  --   --  1.68*  CALCIUM 7.8*  --   --  7.9*  MG 1.9   < > 2.5* 2.5*  PHOS  --    < > 3.3 2.7   < > = values in this interval not displayed.   Lipid Panel:  Recent Labs  Lab 12/04/2020 1625 12/24/20 0514 01/02/2021 0522  CHOL 115  --   --   TRIG 68   < > 200*  HDL 41  --   --   CHOLHDL 2.8  --   --   VLDL 14  --   --   LDLCALC 60  --   --    < > = values in this interval not displayed.   HgbA1c:  Recent Labs  Lab 01/01/2021 1625  HGBA1C 5.4   IMAGING past 24 hours  CT HEAD WO CONTRAST  Result Date: 12/24/2020 IMPRESSION:  1. Stable non contrast CT appearance of the brain since yesterday. Multifocal chronic ischemia with no acute or evolving infarct by CT.  2. No acute intracranial hemorrhage. Stable  mixed density left subdural hematoma.    MR BRAIN WO CONTRAST  Result Date: 12/08/2020 IMPRESSION: Multiple small areas of acute infarct in the right cerebral hemisphere. Interval resolution of small infarct in the left hippocampus. Loss of flow void right internal carotid artery compatible with interval occlusion Subacute subdural hematoma left 7 mm, unchanged.   CTA head/neck 12/05/2020   CTA neck:  1. The cervical right ICA becomes occluded or near occluded at its proximal to mid aspect. The vessel remains occluded or near occluded throughout the remainder of the neck.  2. Left CCA and ICA patent within the neck with less than 50% stenosis.  3. Vertebral arteries patent within the neck bilaterally.  4. Moderate/severe stenosis at the origin of the dominant left vertebral artery.   CTA head:  1. The right ICA remains occluded or near occluded intracranially throughout the siphon region. There is reconstitution of enhancement within this vessel at the level of the supraclinoid segment.  2. High-grade focal stenosis within an inferior division proximal M2 right MCA branch vessel.  3. Atherosclerotic irregularity of additional proximal to mid right M2 MCA branch vessels. Most notably, there is segmental high-grade stenosis within a superior division mid M2 right MCA branch.  4. 2 mm medially projecting vascular protrusion arising from the supraclinoid right ICA. This may reflect an aneurysm, infundibulum or the origin of an otherwise occluded right posterior communicating artery.   CEREBRAL PERFUSION W CONTRAST CT perfusion head: The perfusion software identifies no core infarct. The small acute infarcts demonstrated on the brain MRI performed earlier today are not detected. 109 mL region of hypoperfusion, predominantly within the right cerebral hemisphere.    PHYSICAL EXAM GENERAL: Elderly male resting in bed on nasal cannula in NAD. Family at bedside providing support.   HEENT: -  Normocephalic and atraumatic, dry mm,  LUNGS - Normal respiratory effort. SaO2 stable.  CV - A fib on tele ABDOMEN - Soft, nontender Ext: warm, well perfused  NEURO:  Mental Status: eyes open to loud noises.. Arouses with verbal stimuli briefly to follow simple commands. Able to state Cesar [name] today; doesn't recognize wife but able to [name] daughter at bedside.  . Brief one or two word verbal responses.  Speech is dysarthric but able to be understood.  Cranial Nerves:  II: PERR 2 mm/brisk. Unable to assess visual fields III, IV, VI: Does not follow or track across midline to the left. Gazes to the right.  V: Does not blink to threat of the left side VII:  left facial droop. VIII: Hearing intact to voice IX, X: unable to assess due to intubated and sedated XI: Lifts LUE antigravity to command.  XII: Head resting at midline. turned to the right.  Motor: At least 3/5 in RLE and good grip in the R hand. 2/5 in LUE/ LLE to noxious stimuli Coordination:Unable to assess due to AMS Gait- deferred  ASSESSMENT/PLAN Cesar [name] is a 87 y.o. male with history of A. fib, CVA, seizure disorder, history of DVT, hyperlipidemia, chronic kidney disease stage III, severe AS status post TAVR in January 2022, OSA not on CPAP, nonobstructive CAD presenting with confusion. Was found to have multiple small areas of acute infarct in the right cerebral hemisphere and right ICA and right MCA occlusions.  He underwent emergent mechanical thrombectomy of right ICA terminus and right MCA occlusions achieving a TICI 3 revascularization     Stroke:  right MCA and left CR, left MCA/PCA punctate scattered infarcts with right ICA and proximal MCA occlusions s/p IR with TICI 3 revasculariztion. These events occurred in the setting of known atrial fibrillation off anticoagulation due to recent burr hole surgery for SDH.    Code Stroke CT head no acute intracranial hemorrhage  CTA head right ICA remains occluded,  High-grade stenosis of M2 right MCA branch vessel.   CTA neck cervical right ICA becomes occluded or near occluded at its proximal to mid aspect. The vessel remains occluded or near occluded throughout the remainder of the neck. Left CCA and ICA patent within the neck with less than 50% stenosis.   CT perfusion no core infarct but large penumbra.  CT head repeat no bleeding  MRI  Multiple small areas of acute infarct in the right cerebral hemisphere. As well as left CR and left MCA/PCA punctate infarcts  MRA Degraded by motion, Right ICA is now patent. Suspect moderate narrowing at the Clinoid  2D Echo: EF 60-65%, Left atrial size was severely dilated. Mild LVH. Left ventricular diastolic  function could not be evaluated due to atrial fibrillation.  LDL 60  HgbA1c 5.4  VTE prophylaxis - heparin IV infusion  aspirin 81 mg daily prior to admission, now on heparin IV.   Recurrent right MCA infarct due to right M1 occlusion s/p IR with TICI3   Acute onset AMS, right gaze and left hemiplegia  CT head no acute change.  CTA head and neck showed right M1 occlusion and short segment of right P2 high grade stenosis vs. Occlusion.   CTP showed large penumbra.  S/p IR 3/23 with TICI3  MRI Interval development of multiple new acute ischemic infarcts involving the right cerebral hemisphere, primarily involving the right temporal lobe, consistent with recent right M1 occlusion.  MRA Interval revascularization of previously identified right M1 occlusion, with wide patency of the right MCA now seen.  Now on heparin IV  Therapy recommendations:  Pending overall stability and progress, very eventful last few days s/p 3 procedures.   Disposition:  TBD  Right axillary A occlusion with suspected cardiac embolus  Right axillary artery occluded with partial thrombus on doppler  S/p LUE thrombectomy 3/24 with Dr. Carlis Abbott   Now RUE warm and pulse intact  Incision bleeding 3/25 - VVS on  board  Hold heparin gtt for 6 hours  Seizure history  11/2020 due to SDH, discharged with depakote 500 bid  This admission Depakote level 44->41->65->38->54  Loaded with Depakote 1500mg  x3  Continue Depakote 500mg  bid -> Q8h  EEG: mild-moderate diffuse encephalopathy, with possible/not definitive increased areas of focal neuronal dysfunction in the right (artifact is more favored) and possibly in the anterior regions (bilaterally).  There are no clear epileptiform abnormalities.  Bilateral lower extremity DVTs  History of blood clots, prior DVT 2006  This admission LE venous doppler - R posterior tibial and peroneal veins and L peroneal veins DVT.  Heparin infusion per stroke protocol -  Goal level Heparin level 0.3-0.5 units/ml  Atrial fibrillation  Previously on coumadin -> eliquis -> Xarelto, held due to SDH s/p craniotomy 11/2020  On heparin IV infusion: heparin level today .23  Monitor for HIT  Hgb today 7.9  Platelet 100  Echo EF 60-65%  Rate controlled  SDH  11/2020 left SDH status post evacuation  Anticoagulation was on hold, plan to resume in 6 weeks  This admission CT head left SDH much improved  History of stroke  03/2019 admitted for left-sided weakness, left hemianopia, MRI showed only incidental small left cerebellar infarct.  Diagnosed with right brain TIA. CT showed no acute finding, but old left MCA infarct. MCA showed right siphon mild to moderate stenosis.  Carotid Doppler negative.  EF 60 to 65%.  LDL 94, A1c 5.3.  Continued on Coumadin and Lipitor 80   Dysphagia  NPO now  S/p cortrak placement  On TF and IVF.    Urosepsis  Febrile on admission 101.9 -> 101.2  WBC today 6.2  UA: +nitrite, +LE, WBC >50  Urine culture: ecoli  Recent ecoli uti (12/06/20)  Received vancomycin IV x1, was on cefepime -> now off  Hypertension->hypotension  Home meds:  Amlodipine 5mg  daily  BP soft . On TF and IVF  Hyperlipidemia  Home meds:   Atorvastatin 80mg  daily, resumed in hospital  LDL 60, goal < 70  Continue statin at discharge  CKD Stage III  Baseline sCr 1.3-1.5.  Cre now 1.18->1.16->1.18->1.41->1.82  S/p contrast  x 3 in past 3 days  Now on albumin, TF and IVF  Other Stroke Risk Factors  Advanced Age >/= 87   Overweight, Body mass index is 26.5 kg/m., recommend weight loss, diet and exercise as appropriate   Hx stroke  Family hx stroke (father stroke)  Coronary artery disease  Abdominal aortic aneurysm  TAVR 10/2020  Other Active Problems  GERD: protonix  BPH: proscar  Hospital day # 6  This patient is critically ill due to stroke post procedure and at significant risk of neurological worsening, death form severe anemia, bleeding, recurrent stroke, intracranial stenosis. This patient's care requires constant monitoring of vital signs, hemodynamics, respiratory and cardiac monitoring, review of multiple databases, neurological assessment, other specialists and medical decision making of high complexity. I spent 35 minutes of neurocritical care time in the care of this patient and discussion with the daughter and wife at the bedside.  Dr. Merlene Laughter -- The patient was seen and examined by me; notes, chart and tests reviewed and discussed with the practice provider, other providers, patient, and family.      Dennison Mascot, PA-C Discussed with Dr. Merlene Laughter.

## 2020-12-28 NOTE — Progress Notes (Signed)
No current order for BP goal, BP 166/64, clarified with Dr. Rory Percy keep SBP<160. New orders received.

## 2020-12-28 NOTE — Progress Notes (Addendum)
Physical Therapy Treatment Patient Details Name: Cesar [name] MRN: [id number] DOB: 03/03/1929 Today's Date: 12/28/2020    History of Present Illness Pt is an 87 y.o. male who presented 3/21 with a fever and weakness. Pt found to have severe sepsis secondary to UTI, acute metabolic encephalopathy, and acute bil lower extremity DVTs. 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 S/p complete revascularization of occluded ICA to the terminus and RT MCA 3/21. ETT 3/21-3/22. Pt found unresponsive 3/23, imaging revealed R M1 occlusion with large penumbra. S/p thrombectomy with reperfusion 3/23. On 3/24 RUE found to acutely ischemic, taken to OR for thrombectomy of R axillary artery. Extubated 3/24. PMH: A. fib, CVA, seizure disorder, history of DVT, hyperlipidemia, CKD stage III, severe aortic stenosis status post TAVR in January 2022, BPH, OSA not on CPAP, ascending thoracic aortic aneurysm, and L subdural hemorrhage 11/2020 s/p burr hole drainage.    PT Comments    Pt with improved alertness and activity tolerance during therapy session today. Increased RUE movement and noted L quadriceps activation, however, continued LUE hemiparesis. Pt requiring two person max-total assist for bed mobility and transitions to standing; able to progress to sitting up on edge of bed unsupported. Continue to recommend CIR to promote neuro recovery, maximize functional mobility and decrease caregiver burden.    Follow Up Recommendations  CIR     Equipment Recommendations   (defer to post acute)    Recommendations for Other Services       Precautions / Restrictions Precautions Precautions: Fall Precaution Comments: HOH, ataxic, L neglect Restrictions Weight Bearing Restrictions: No Other Position/Activity Restrictions: HOH, does have hearing aides    Mobility  Bed Mobility Overal bed mobility: Needs  Assistance Bed Mobility: Rolling;Supine to Sit;Sit to Supine Rolling: +2 for physical assistance;Total assist   Supine to sit: Total assist;+2 for physical assistance Sit to supine: +2 for physical assistance;Total assist   General bed mobility comments: TotalA + 2, decreased initiation of task.    Transfers Overall transfer level: Needs assistance Equipment used: 2 person hand held assist Transfers: Sit to/from Stand Sit to Stand: +2 physical assistance;Max assist         General transfer comment: Standing from edge of bed x 2 with maxA + 2, increased trunk flexion, left knee blocked but no knee buckle noted. Unable to achieve full upright positioning  Ambulation/Gait                 Stairs             Wheelchair Mobility    Modified Rankin (Stroke Patients Only) Modified Rankin (Stroke Patients Only) Pre-Morbid Rankin Score: Moderate disability Modified Rankin: Severe disability     Balance Overall balance assessment: Needs assistance Sitting-balance support: No upper extremity supported;Feet supported Sitting balance-Leahy Scale: Poor Sitting balance - Comments: Pt progressing to min guard assist   Standing balance support: Bilateral upper extremity supported;During functional activity Standing balance-Leahy Scale: Poor                              Cognition Arousal/Alertness: Awake/alert Behavior During Therapy: Flat affect Overall Cognitive Status: Impaired/Different from baseline Area of Impairment: Orientation;Attention;Memory;Following commands;Safety/judgement;Awareness;Problem solving                 Orientation Level: Disoriented to;Place;Time;Situation Current Attention Level: Focused Memory: Decreased short-term memory Following Commands: Follows  one step commands inconsistently Safety/Judgement: Decreased awareness of safety;Decreased awareness of deficits Awareness: Intellectual Problem Solving: Slow  processing;Difficulty sequencing;Requires verbal cues;Requires tactile cues General Comments: Pt with improved alertness today, able to state name and birthday with slurred speech. Is not oriented; when told he was at [facility], pt stating, "again?" Pt able to make certain needs known i.e. "scratch my back." Is asking about eating but education provided regarding NGT. Family members entering room during session, pt stating, "how you doing?" but unable to identify them.      Exercises General Exercises - Upper Extremity Shoulder Flexion: PROM;Left;10 reps;Supine Elbow Flexion: PROM;Left;10 reps;Supine Elbow Extension: PROM;Left;10 reps;Supine Digit Composite Flexion: PROM;Left;10 reps;Supine Composite Extension: PROM;Left;10 reps;Supine General Exercises - Lower Extremity Ankle Circles/Pumps: PROM;Both;10 reps;Supine Other Exercises Other Exercises: Sitting: x3 lateral leans onto L forearm    General Comments  VSS      Pertinent Vitals/Pain Pain Assessment: Faces Faces Pain Scale: No hurt    Home Living                      Prior Function            PT Goals (current goals can now be found in the care plan section) Acute Rehab PT Goals Patient Stated Goal: to lay back PT Goal Formulation: With patient Time For Goal Achievement: 01/10/21 Potential to Achieve Goals: Good Progress towards PT goals: Progressing toward goals    Frequency    Min 4X/week      PT Plan Current plan remains appropriate    Co-evaluation              AM-PAC PT "6 Clicks" Mobility   Outcome Measure  Help needed turning from your back to your side while in a flat bed without using bedrails?: Total Help needed moving from lying on your back to sitting on the side of a flat bed without using bedrails?: Total Help needed moving to and from a bed to a chair (including a wheelchair)?: Total Help needed standing up from a chair using your arms (e.g., wheelchair or bedside  chair)?: Total Help needed to walk in hospital room?: Total Help needed climbing 3-5 steps with a railing? : Total 6 Click Score: 6    End of Session Equipment Utilized During Treatment: Gait belt Activity Tolerance: Patient tolerated treatment well Patient left: in bed;with call bell/phone within reach;with bed alarm set;with family/visitor present Nurse Communication: Mobility status PT Visit Diagnosis: Unsteadiness on feet (R26.81);Other abnormalities of gait and mobility (R26.89);Muscle weakness (generalized) (M62.81);Other symptoms and signs involving the nervous system (R29.898);Difficulty in walking, not elsewhere classified (R26.2)     Time: 8921-1941 PT Time Calculation (min) (ACUTE ONLY): 37 min  Charges:  $Therapeutic Exercise: 8-22 mins $Therapeutic Activity: 8-22 mins                     Wyona Almas, PT, DPT Acute Rehabilitation Services Pager [phone] Office [phone]    Deno Etienne 12/28/2020, 9:31 AM

## 2020-12-28 NOTE — Progress Notes (Signed)
[facility] [place] PROGRESS NOTE  ASSESSMENT / PLAN: Cesar [name] is a 87 y.o. male postop day 2 right upper extremity embolectomy.  Continue local wound care to the incision.  Continue anticoagulation.  Otherwise per primary.  Following along with you.  SUBJECTIVE: Minimally responsive.  Therapists working with mobility.  OBJECTIVE: BP (!) 165/69   Pulse 73   Temp 97.7 F (36.5 C) (Oral)   Resp 18   Ht 6\' 1"  (1.854 m) Comment: per previous ht  Wt 91.1 kg   SpO2 96%   BMI 26.50 kg/m   Intake/Output Summary (Last 24 hours) at 12/28/2020 1145 Last data filed at 12/28/2020 0800 Gross per 24 hour  Intake 2156.14 ml  Output 2875 ml  Net -718.86 ml    Incision clean dry and intact. Strong Doppler flow in radial, ulnar and palmar arch arteries.   CBC Latest Ref Rng & Units 12/28/2020 12/27/2020 12/05/2020  WBC 4.0 - 10.5 K/uL 6.2 7.9 7.3  Hemoglobin 13.0 - 17.0 g/dL 7.9(L) 8.9(L) 10.0(L)  Hematocrit 39.0 - 52.0 % 24.7(L) 28.1(L) 29.3(L)  Platelets 150 - 400 K/uL 100(L) 112(L) 142(L)     CMP Latest Ref Rng & Units 12/28/2020 12/27/2020 12/15/2020  Glucose 70 - 99 mg/dL 132(H) 93 117(H)  BUN 8 - 23 mg/dL 36(H) 24(H) 18  Creatinine 0.61 - 1.24 mg/dL 1.68(H) 1.82(H) 1.41(H)  Sodium 135 - 145 mmol/L 144 140 138  Potassium 3.5 - 5.1 mmol/L 3.8 4.3 3.8  Chloride 98 - 111 mmol/L 114(H) 112(H) 109  CO2 22 - 32 mmol/L 21(L) 21(L) 20(L)  Calcium 8.9 - 10.3 mg/dL 7.9(L) 7.8(L) 7.8(L)  Total Protein 6.5 - 8.1 g/dL - - -  Total Bilirubin 0.3 - 1.2 mg/dL - - -  Alkaline Phos 38 - 126 U/L - - -  AST 15 - 41 U/L - - -  ALT 0 - 44 U/L - - -    Estimated Creatinine Clearance: 33.7 mL/min (A) (by C-G formula based on SCr of 1.68 mg/dL (H)).  Cesar [name]. Stanford Breed, MD [facility] Phone Number: [phone] 12/28/2020 11:45 AM

## 2020-12-28 NOTE — Progress Notes (Signed)
NAME:  Cesar [name], MRN:  [id number], DOB:  [birth year], LOS: 5 ADMISSION DATE:  12/09/2020, CONSULTATION DATE:  .12/28/20 REFERRING MD:  Cheral Marker, CHIEF COMPLAINT:  Weakness   History of Present Illness:  87 y.o. M with PMH significant for AAA, stage III CKD, OSA, seizures, OSA, severe AS s/p TAVR 10/22/20, permanent Afib who has recently admitted for TAVR and discharged on [place] and Eliquis, re-admitted 2/15 with large, chronic subdural hematoma after falls and underwent bur hole drainage with drain placement.  His anti-coagulation was held until 2/21  and he was discharged to rehab 2/28 ( CIR)  and then to home on 3/17.      He then presented to the ED on 3/21 with generalized weakness and fever to 101.9 and was found to have UTI and bilateral DVT's MRI showed possible internal carotid artery occlusion on the right and right MCA CVA.   His initial head CT did not show acute changes, but throughout ED course he became increasingly confused and had slurred speech. Pt. Also has a seizure disorder , EEG did not show seizure activity Depakote was subtherapeutic, and he was loaded.  CTA showed occluded R ICA, so was taken emergently to IR, Underwent thrombectomy of ICA and right MCA and was admitted and transported to ICU intubated , and stable  Pertinent  Medical History   has a past medical history of AAA (abdominal aortic aneurysm) ([place]), CKD (chronic kidney disease), stage III ([place]), CVA (cerebral infarction), Depression, DVT (deep venous thrombosis) ([place]) (2006), H/O blood clots, Hematuria, HOH (hard of hearing), Hypercholesterolemia, OSA (obstructive sleep apnea), Permanent atrial fibrillation ([place]), Prostate enlargement, Pulmonary nodules, 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   Significant Hospital Events: Including procedures, antibiotic start and stop dates in addition to other pertinent  events   . Admit to hospitalists, worsening neuro status and found to have occluded R ICA, taken to IR and then ICU, PCCM consult . 3/22 extubated. Cefepime for e coli uti  . 3/23 remains stable following extubation, not on any vasoactive infusions. PCCM to sign off. Found unresponsive and slumped over.  CTA head/neck showed R M1 occlusion and R P2 high grade stenosis vs occlusion, CTP large penumbra taken back emergently to [facility] w/complete revascularization of occluded R MCA; returns intubated  Interim History / Subjective:  No events. Still weak cough  Objective   Blood pressure (!) 145/59, pulse 87, temperature 99.1 F (37.3 C), temperature source Oral, resp. rate (!) 25, height 6\' 1"  (1.854 m), weight 91.1 kg, SpO2 98 %.    FiO2 (%):  [21 %] 21 %   Intake/Output Summary (Last 24 hours) at 12/28/2020 1937 Last data filed at 12/28/2020 0700 Gross per 24 hour  Intake 2142.16 ml  Output 2875 ml  Net -732.84 ml   Filed Weights   12/29/2020 0443 12/19/2020 2010 12/28/20 0300  Weight: 86.2 kg 91 kg 91.1 kg   Constitutional: ill appearing man in NAD  Eyes: EOMI, pupils equal Ears, nose, mouth, and throat: MM dry, trachea midline Cardiovascular: Irregular, ext warm Respiratory: weak cough, mildly tachypneic Gastrointestinal: soft, +BS Skin: No rashes, normal turgor, R arm incision site from thrombectomy looks okay Neurologic: he has pretty profound weakness, hard of hearing Psychiatric: RASS 0 1+ LE edema  Labs/imaging that I havepersonally reviewed   Hgb drifting down Sodium up K slightly low   Resolved Hospital Problem list   Respiratory failure requiring intubation 3/22;  3/24   Assessment & Plan:  Acute R ICA stroke x 2 both removed by thrombectomy, some residual small CVAs on MRI  Acute LE DVT bilaterally; no evidence of intracardiac shunt with TTE/bubble  Acute kidney injury after dye load x 2 - improved Afib/RVR resolved Muscular deconditioning  Recent SDH s/p burr hole  stable on imaging  Severe AS s/p TAVR  R brachial clot post thrombectomy  E coli UTI s/p 3 days tx  Respiratory failure, acute hypoxemic respiratory failure due to inability to protect airway, extubated 3/24 but lingering issues with secretion clearance  Hx seizures on VPA  Some volume overload  Urinary retention on proscar (home med)   - Continue pulmonary toileting as ordered - Aggressive PT/OT - Continue heparin gtt for now until assured no further surgical plans and H/h stable then transition to eliquis - Watch for further s/s of bleeding  In R arm - Hold on diuretics again today, add free water - Keep in ICU while resp status so tenuous  Best practice (evaluated daily)  Diet:  TF Pain/Anxiety/Delirium protocol (if indicated): No VAP protocol (if indicated): Not indicated DVT prophylaxis: Systemic AC  GI prophylaxis: PPI Glucose control:  SSI Yes Central venous access:  N/A Arterial line:  out Foley:  out Mobility:  OOB -- pt   PT consulted: Yes Last date of multidisciplinary goals of care discussion [per primary] Code Status:  full code Disposition: ICU  Erskine Emery MD PCCM

## 2020-12-28 NOTE — Progress Notes (Signed)
Inpatient Rehab Admissions Coordinator:   I spoke with patient's wife and daughter to  discuss potential CIR admission. They state that Pt. Did very well during recent CIR admission and they would like him to return to rehab if he progresses enough that he can tolerate 3 hours of daily intensive rehab. I agree that he would benefit if he is able to tolerate. Note, PT documented increased participation and alertness today and Pt .attempted transfers this AM.. Will pursue for potential admit next week, pending bed availability and medical readiness. Please contact me with any questions.  Clemens Catholic, [place], [place] Admissions Coordinator  [phone] ([place]) [phone] (office)

## 2020-12-28 NOTE — Progress Notes (Addendum)
When doing shift assessment, this RN noticed that patient was not responding to commands or pain on LUE and barely LLE. Was told in report and saw in previous charting that patient followed commands and responded to pain with all extremities. Dr. Rory Percy paged around 2050. Orders for STAT head CT and CTA.

## 2020-12-28 NOTE — Progress Notes (Signed)
On-call neurology progress note   S:// Called by RN regarding change in neurological examination According to the report received by the RN at shift change, last known well at 4 PM, he was able to move his left upper and lower extremities much more than what he did for the RN around 8:53 PM. Ordered a stat CT head. 87 year old gentleman with a left subdural, with right hemispheric ischemic strokes who has been revascularized twice in the past week. Patient seen and examined at bedside, again in the CT scanner and once again went back in the room   O:// Current vital signs: BP (!) 156/68   Pulse 79   Temp (!) 102.2 F (39 C) (Axillary) Comment: RN notified, blankets removed  Resp (!) 31   Ht 6\' 1"  (1.854 m) Comment: per previous ht  Wt 91.1 kg   SpO2 100%   BMI 26.50 kg/m  Vital signs in last 24 hours: Temp:  [97.7 F (36.5 C)-102.2 F (39 C)] 102.2 F (39 C) (03/26 2000) Pulse Rate:  [36-136] 79 (03/26 1900) Resp:  [18-31] 31 (03/26 1900) BP: (103-170)/(45-84) 156/68 (03/26 1900) SpO2:  [89 %-100 %] 100 % (03/26 1900) Weight:  [91.1 kg] 91.1 kg (03/26 0300) On my examination he is lethargic, opens eyes to voice Speech is extremely dysarthric and difficult to understand. Extremely poor attention concentration Pupils equal round reactive to light Extraocular movements appear unrestricted Does not blink to threat from the left Also has a left facial droop-dentures that are in the mouth precluded good assessment and him not following all commands also precludes a good assessment On examination moves the right side more briskly than the left-has flicker movement in the left upper and lower extremity with 1-2/5 at best in the left upper and lower extremity This is in line with what was documented by the neurologist note from earlier in the day but according to the RN this is a change from prior in the dayshift nursing assessment. Grimaces to noxious stimulation on both  sides  Medications  Current Facility-Administered Medications:  .  0.9 %  sodium chloride infusion, , Intravenous, Continuous, Rosalin Hawking, MD, Last Rate: 10 mL/hr at 12/28/20 1900, Infusion Verify at 12/28/20 1900 .  acetaminophen (TYLENOL) tablet 650 mg, 650 mg, Oral, Q4H PRN **OR** acetaminophen (TYLENOL) 160 MG/5ML solution 650 mg, 650 mg, Per Tube, Q4H PRN, 650 mg at 12/28/20 0105 **OR** acetaminophen (TYLENOL) suppository 650 mg, 650 mg, Rectal, Q4H PRN, Rhyne, Samantha J, PA-C, 650 mg at 12/27/20 0405 .  atorvastatin (LIPITOR) tablet 80 mg, 80 mg, Per Tube, Daily, Rosalin Hawking, MD, 80 mg at 12/28/20 1005 .  chlorhexidine (PERIDEX) 0.12 % solution 15 mL, 15 mL, Mouth Rinse, BID, Simpson, Paula B, NP, 15 mL at 12/28/20 1006 .  Chlorhexidine Gluconate Cloth 2 % PADS 6 each, 6 each, Topical, Daily, Rhyne, Samantha J, PA-C, 6 each at 12/28/20 1006 .  docusate (COLACE) 50 MG/5ML liquid 100 mg, 100 mg, Per Tube, BID, Rhyne, Samantha J, PA-C, 100 mg at 12/28/20 1007 .  feeding supplement (PIVOT 1.5 CAL) liquid 1,000 mL, 1,000 mL, Per Tube, Continuous, Rosalin Hawking, MD, Last Rate: 60 mL/hr at 12/27/20 1330, 1,000 mL at 12/27/20 1330 .  feeding supplement (PROSource TF) liquid 45 mL, 45 mL, Per Tube, BID, Rosalin Hawking, MD, 45 mL at 12/28/20 1005 .  free water 200 mL, 200 mL, Per Tube, Q4H, Candee Furbish, MD, 200 mL at 12/28/20 2012 .  heparin ADULT infusion 100 units/mL (  25000 units/251mL), 1,100 Units/hr, Intravenous, Continuous, Erenest Blank, RPH, Last Rate: 11 mL/hr at 12/28/20 1900, 1,100 Units/hr at 12/28/20 1900 .  hydrALAZINE (APRESOLINE) injection 10 mg, 10 mg, Intravenous, Q4H PRN, Amie Portland, MD .  labetalol (NORMODYNE) injection 10 mg, 10 mg, Intravenous, Q2H PRN, Amie Portland, MD, 10 mg at 12/28/20 1154 .  MEDLINE mouth rinse, 15 mL, Mouth Rinse, q12n4p, Simpson, Paula B, NP, 15 mL at 12/28/20 1556 .  metoprolol tartrate (LOPRESSOR) injection 2.5-5 mg, 2.5-5 mg, Intravenous, Q3H  PRN, Jennelle Human B, NP, 2.5 mg at 12/27/20 2138 .  multivitamin with minerals tablet 1 tablet, 1 tablet, Per Tube, Daily, Rosalin Hawking, MD, 1 tablet at 12/28/20 1006 .  pantoprazole sodium (PROTONIX) 40 mg/20 mL oral suspension 40 mg, 40 mg, Per Tube, Daily, Rosalin Hawking, MD, 40 mg at 12/28/20 1006 .  polyethylene glycol (MIRALAX / GLYCOLAX) packet 17 g, 17 g, Per Tube, Daily, Rhyne, Samantha J, PA-C .  sodium chloride HYPERTONIC 3 % nebulizer solution 4 mL, 4 mL, Nebulization, Daily, Bowser, Grace E, NP, 4 mL at 12/27/20 1418 .  valproic acid (DEPAKENE) 250 MG/5ML solution 500 mg, 500 mg, Per Tube, Q8H, Rosalin Hawking, MD, 500 mg at 12/28/20 1422 Labs CBC    Component Value Date/Time   WBC 6.2 12/28/2020 0400   RBC 2.46 (L) 12/28/2020 0400   HGB 7.9 (L) 12/28/2020 0400   HCT 24.7 (L) 12/28/2020 0400   PLT 100 (L) 12/28/2020 0400   MCV 100.4 (H) 12/28/2020 0400   MCH 32.1 12/28/2020 0400   MCHC 32.0 12/28/2020 0400   RDW 14.5 12/28/2020 0400   LYMPHSABS 0.8 12/28/2020 0400   MONOABS 0.9 12/28/2020 0400   EOSABS 0.1 12/28/2020 0400   BASOSABS 0.0 12/28/2020 0400    CMP     Component Value Date/Time   NA 144 12/28/2020 0400   NA 141 10/30/2020 1520   K 3.8 12/28/2020 0400   CL 114 (H) 12/28/2020 0400   CO2 21 (L) 12/28/2020 0400   GLUCOSE 132 (H) 12/28/2020 0400   BUN 36 (H) 12/28/2020 0400   BUN 28 (H) 10/30/2020 1520   CREATININE 1.68 (H) 12/28/2020 0400   CALCIUM 7.9 (L) 12/28/2020 0400   PROT 5.9 (L) 12/27/2020 0218   PROT 6.7 08/12/2020 1137   ALBUMIN 2.6 (L) 12/28/2020 0218   ALBUMIN 4.2 08/12/2020 1137   AST 27 12/07/2020 0218   ALT 17 12/16/2020 0218   ALKPHOS 42 12/17/2020 0218   BILITOT 0.8 12/24/2020 0218   BILITOT 0.9 08/12/2020 1137   GFRNONAA 39 (L) 12/28/2020 0400   GFRAA 57 (L) 10/30/2020 1520    Imaging I have reviewed images in epic and the results pertinent to this consultation are: Stat head CT shows subacute infarct in the right basal ganglia,  right temporal lobe and right occipital lobe along with the unchanged small left convexity subdural hematoma. Stat CTA head and neck was also done which did not show any proximal emergent large vessel occlusion.  60% stenosis of the proximal right carotid artery and less than 50% stenosis of the proximal left carotid artery with patency of both.  Narrowing of the left vertebral artery origin also seen.  Assessment:  87 year old with complex past medical history and complex hospital course requiring multiple right carotid/MCA revascularizations in this admission, who also has a left-sided subdural, prior history of A. fib, strokes, DVT, on anticoagulation prior to presentation which was stopped because of the subdural, severe aortic stenosis status post TAVR  in January 2022, nonobstructive coronary artery disease, who was noted to have right MCA infarctions secondary to right ICA and proximal right MCA M1 segment occlusion status post revascularization x2 days in the last week with possible worsening of left-sided weakness. CT head shows a large area of the right cerebral hemisphere already showing subacute stroke changes. No emergent large vessel occlusion on head and neck imaging that was repeated today Also heparin drip after he had a right axillary artery occlusion requiring left upper extremity thrombectomy.  No worsening of the subdural. Symptom worsening likely secondary to progression of the stroke versus recrudescence of symptoms.   Recommendations: Continue current care Continue heparin drip. Obtain EEG in the morning Check chest x-ray and urinalysis again Check CBC BMP in the a.m.  Stroke team will continue to follow  Talk to the daughter over the phone and updated her on the events.  Answered all her questions.  The family will be in the hospital in the morning.  -- Amie Portland, MD Neurologist  CRITICAL CARE ATTESTATION Performed by: Amie Portland, MD Total additional critical  care time: 50 minutes Critical care time was exclusive of separately billable procedures and treating other patients and/or supervising APPs/Residents/Students Critical care was necessary to treat or prevent imminent or life-threatening deterioration due to acute ischemic stroke, subdural hematoma, carotid stenosis This patient is critically ill and at significant risk for neurological worsening and/or death and care requires constant monitoring. Critical care was time spent personally by me on the following activities: development of treatment plan with patient and/or surrogate as well as nursing, discussions with consultants, evaluation of patient's response to treatment, examination of patient, obtaining history from patient or surrogate, ordering and performing treatments and interventions, ordering and review of laboratory studies, ordering and review of radiographic studies, pulse oximetry, re-evaluation of patient's condition, participation in multidisciplinary rounds and medical decision making of high complexity in the care of this patient.

## 2020-12-28 NOTE — Progress Notes (Signed)
ANTICOAGULATION CONSULT NOTE  Pharmacy Consult for heparin Indication: acute bilateral DVT, stroke  Heparin Dosing Weight: 91 kg  Labs: Recent Labs    01/01/2021 0522 01/02/2021 1730 12/27/20 0415 12/28/20 0400  HGB 10.0*  --  8.9* 7.9*  HCT 29.3*  --  28.1* 24.7*  PLT 142*  --  112* 100*  HEPARINUNFRC <0.10* 0.62 0.59 0.23*  CREATININE 1.41*  --  1.82* 1.68*    Assessment: 53 yom s/p bur hole drainage of complex L chronic SDH last month presenting with acute CVA s/p IR for cerebral arteriogram, emergent mechanical thrombectomy on 3/21. Patient also found to have acute bilateral DVT. PTA apixababn has been held since 11/21/20 with SDH/crani with plan to hold x 6 weeks.  Neurosurgery cleared to begin heparin per stroke protocol on 3/22. Neurology and CCM in agreement. Patient did experience platelet drop to 30s in rehab on heparin SQ with improvement once d/c'd. HIT antibody resulted as negative. Per discussion with CCM, will cautiously start heparin and monitor plt trend for now. SDH appears to be almost fully resolved per Neurosurgery.  Patient went for revascularization 3/23 and heparin was held overnight. Orders 3/24 to resume heparin drip.  Heparin level remains above goal at 0.59 after rate decrease last night. Hg low but relatively stable 8.9, plt down to 112. Noted SCr trended up to 1.82 today. No major bleeding (does note oozing from thrombectomy incision but not significant - MD aware per note) or issues with infusion per discussion with RN.  3/26 AM update:  -Heparin level below goal after re-start s/p holding for a few hours yesterday evening for thrombectomy site bleeding -No issues with bleeding overnight per RN -Hgb is down some-watch closely   Goal of Therapy:  Heparin level 0.3-0.5 units/ml, no bolus per stroke protocol Monitor platelets by anticoagulation protocol: Yes   Plan:  Inc heparin slightly to 1100 units/hr 1400 heparin level Watch Hgb  [name],  PharmD, BCPS Clinical Pharmacist Phone: [phone]

## 2020-12-29 ENCOUNTER — Inpatient Hospital Stay (HOSPITAL_COMMUNITY): Payer: Medicare Other

## 2020-12-29 DIAGNOSIS — Z7189 Other specified counseling: Secondary | ICD-10-CM | POA: Diagnosis not present

## 2020-12-29 DIAGNOSIS — Z66 Do not resuscitate: Secondary | ICD-10-CM

## 2020-12-29 DIAGNOSIS — S065X9A Traumatic subdural hemorrhage with loss of consciousness of unspecified duration, initial encounter: Secondary | ICD-10-CM | POA: Diagnosis not present

## 2020-12-29 DIAGNOSIS — J9601 Acute respiratory failure with hypoxia: Secondary | ICD-10-CM | POA: Diagnosis not present

## 2020-12-29 DIAGNOSIS — Z515 Encounter for palliative care: Secondary | ICD-10-CM

## 2020-12-29 LAB — CBC WITH DIFFERENTIAL/PLATELET
Abs Immature Granulocytes: 0.09 10*3/uL — ABNORMAL HIGH (ref 0.00–0.07)
Basophils Absolute: 0 10*3/uL (ref 0.0–0.1)
Basophils Relative: 0 %
Eosinophils Absolute: 0.3 10*3/uL (ref 0.0–0.5)
Eosinophils Relative: 5 %
HCT: 26.6 % — ABNORMAL LOW (ref 39.0–52.0)
Hemoglobin: 8.3 g/dL — ABNORMAL LOW (ref 13.0–17.0)
Immature Granulocytes: 1 %
Lymphocytes Relative: 12 %
Lymphs Abs: 0.8 10*3/uL (ref 0.7–4.0)
MCH: 32.8 pg (ref 26.0–34.0)
MCHC: 31.2 g/dL (ref 30.0–36.0)
MCV: 105.1 fL — ABNORMAL HIGH (ref 80.0–100.0)
Monocytes Absolute: 1 10*3/uL (ref 0.1–1.0)
Monocytes Relative: 15 %
Neutro Abs: 4.7 10*3/uL (ref 1.7–7.7)
Neutrophils Relative %: 67 %
Platelets: 85 10*3/uL — ABNORMAL LOW (ref 150–400)
RBC: 2.53 MIL/uL — ABNORMAL LOW (ref 4.22–5.81)
RDW: 15 % (ref 11.5–15.5)
WBC: 6.9 10*3/uL (ref 4.0–10.5)
nRBC: 0 % (ref 0.0–0.2)

## 2020-12-29 LAB — GLUCOSE, CAPILLARY
Glucose-Capillary: 102 mg/dL — ABNORMAL HIGH (ref 70–99)
Glucose-Capillary: 107 mg/dL — ABNORMAL HIGH (ref 70–99)
Glucose-Capillary: 116 mg/dL — ABNORMAL HIGH (ref 70–99)
Glucose-Capillary: 117 mg/dL — ABNORMAL HIGH (ref 70–99)
Glucose-Capillary: 128 mg/dL — ABNORMAL HIGH (ref 70–99)
Glucose-Capillary: 137 mg/dL — ABNORMAL HIGH (ref 70–99)

## 2020-12-29 LAB — BASIC METABOLIC PANEL
Anion gap: 6 (ref 5–15)
BUN: 39 mg/dL — ABNORMAL HIGH (ref 8–23)
CO2: 22 mmol/L (ref 22–32)
Calcium: 7.8 mg/dL — ABNORMAL LOW (ref 8.9–10.3)
Chloride: 116 mmol/L — ABNORMAL HIGH (ref 98–111)
Creatinine, Ser: 1.36 mg/dL — ABNORMAL HIGH (ref 0.61–1.24)
GFR, Estimated: 50 mL/min — ABNORMAL LOW (ref >60)
Glucose, Bld: 120 mg/dL — ABNORMAL HIGH (ref 70–99)
Potassium: 4 mmol/L (ref 3.5–5.1)
Sodium: 144 mmol/L (ref 135–145)

## 2020-12-29 LAB — APTT
aPTT: 138 seconds — ABNORMAL HIGH (ref 24–36)
aPTT: 86 seconds — ABNORMAL HIGH (ref 24–36)

## 2020-12-29 LAB — VALPROIC ACID LEVEL: Valproic Acid Lvl: 86 ug/mL (ref 50.0–100.0)

## 2020-12-29 LAB — HEPARIN LEVEL (UNFRACTIONATED): Heparin Unfractionated: 0.41 IU/mL (ref 0.30–0.70)

## 2020-12-29 MED ORDER — ARGATROBAN 50 MG/50ML IV SOLN
0.5000 ug/kg/min | INTRAVENOUS | Status: DC
  Administered 2020-12-29: 0.5 ug/kg/min via INTRAVENOUS
  Filled 2020-12-29: qty 50

## 2020-12-29 MED ORDER — METOLAZONE 2.5 MG PO TABS
2.5000 mg | ORAL_TABLET | Freq: Two times a day (BID) | ORAL | Status: AC
  Administered 2020-12-29 (×2): 2.5 mg via ORAL
  Filled 2020-12-29 (×2): qty 1

## 2020-12-29 MED ORDER — POTASSIUM & SODIUM PHOSPHATES 280-160-250 MG PO PACK
1.0000 | PACK | Freq: Three times a day (TID) | ORAL | Status: AC
  Administered 2020-12-29 (×3): 1 via ORAL
  Filled 2020-12-29 (×3): qty 1

## 2020-12-29 MED ORDER — MODAFINIL 100 MG PO TABS
100.0000 mg | ORAL_TABLET | Freq: Every day | ORAL | Status: DC
  Administered 2020-12-29 – 2020-12-30 (×2): 100 mg via ORAL
  Filled 2020-12-29 (×2): qty 1

## 2020-12-29 MED ORDER — ARGATROBAN 50 MG/50ML IV SOLN
0.2500 ug/kg/min | INTRAVENOUS | Status: DC
  Administered 2020-12-29 (×2): 0.25 ug/kg/min via INTRAVENOUS
  Filled 2020-12-29: qty 50

## 2020-12-29 NOTE — Progress Notes (Addendum)
ANTICOAGULATION CONSULT NOTE  Pharmacy Consult for heparin >> argatroban Indication: acute bilateral DVT, stroke  Heparin Dosing Weight: 91 kg  Labs: Recent Labs    12/27/20 0415 12/28/20 0400 12/28/20 1428 12/29/20 0631 12/29/20 1414  HGB 8.9* 7.9*  --  8.3*  --   HCT 28.1* 24.7*  --  26.6*  --   PLT 112* 100*  --  85*  --   APTT  --   --   --   --  138*  HEPARINUNFRC 0.59 0.23* 0.34 0.41  --   CREATININE 1.82* 1.68*  --  1.36*  --     Assessment: 89 yom s/p bur hole drainage of complex L chronic SDH last month presenting with acute CVA s/p IR for cerebral arteriogram, emergent mechanical thrombectomy on 3/21. Patient also found to have acute bilateral DVT. PTA apixababn has been held since 11/21/20 with SDH/crani with plan to hold x 6 weeks.  Neurosurgery cleared to begin heparin per stroke protocol on 3/22. Neurology and CCM in agreement. Patient did experience platelet drop to 30s in rehab on heparin SQ with improvement once d/c'd. HIT antibody resulted as negative.   With patient's previous history of dropping plt on heparin and idiopathic R arm and R ICA re-occlusion, per discussion with CCM, will recheck HIT antibody heparin was transitioned to  Argatroban on 3/27. Plans noted for possible comfort care -aPTT=86 after holding for 1hr and decrease to 0.25mg /kg/hr  Goal of Therapy:  aPTT 50-90 seconds, no bolus per stroke protocol Monitor platelets by anticoagulation protocol: Yes   Plan:  Continueargatroban 0.25 mcg/kg/min APTT in 4 hours to confirm  Hildred Laser, PharmD Clinical Pharmacist **Pharmacist phone directory can now be found on [email or web address] ([email or web address]).  Listed under [place].

## 2020-12-29 NOTE — Progress Notes (Signed)
EEG complete - results pending 

## 2020-12-29 NOTE — Progress Notes (Addendum)
  Progress Note    12/29/2020 9:47 AM 3 Days Post-Op  Subjective: Hard of hearing. Does respond appropriately to commands   Vitals:   12/29/20 0700 12/29/20 0800  BP: (!) 165/62 (!) 162/75  Pulse: 76 85  Resp: (!) 26 (!) 21  Temp:  97.9 F (36.6 C)  SpO2: 100% 100%   Physical Exam: Cardiac: regualr Lungs: non labored Incisions:  Right forearm incision is clean, dry and intact. Was some oozing under Dermabond. RUE edematous Extremities:  Doppler radial and ulnar signals. Right hand warm. Motor and sensation intact Neurologic: alert, hard of hearing but able to respond appropriately to commands  CBC    Component Value Date/Time   WBC 6.9 12/29/2020 0631   RBC 2.53 (L) 12/29/2020 0631   HGB 8.3 (L) 12/29/2020 0631   HCT 26.6 (L) 12/29/2020 0631   PLT 85 (L) 12/29/2020 0631   MCV 105.1 (H) 12/29/2020 0631   MCH 32.8 12/29/2020 0631   MCHC 31.2 12/29/2020 0631   RDW 15.0 12/29/2020 0631   LYMPHSABS 0.8 12/29/2020 0631   MONOABS 1.0 12/29/2020 0631   EOSABS 0.3 12/29/2020 0631   BASOSABS 0.0 12/29/2020 0631    BMET    Component Value Date/Time   NA 144 12/29/2020 0631   NA 141 10/30/2020 1520   K 4.0 12/29/2020 0631   CL 116 (H) 12/29/2020 0631   CO2 22 12/29/2020 0631   GLUCOSE 120 (H) 12/29/2020 0631   BUN 39 (H) 12/29/2020 0631   BUN 28 (H) 10/30/2020 1520   CREATININE 1.36 (H) 12/29/2020 0631   CALCIUM 7.8 (L) 12/29/2020 0631   GFRNONAA 50 (L) 12/29/2020 0631   GFRAA 57 (L) 10/30/2020 1520    INR    Component Value Date/Time   INR 1.3 (H) 11/19/2020 1751     Intake/Output Summary (Last 24 hours) at 12/29/2020 0947 Last data filed at 12/29/2020 0800 Gross per 24 hour  Intake 3261.42 ml  Output 3400 ml  Net -138.58 ml     Assessment/Plan:  87 y.o. male is s/p right upper extremity embolectomy 3 Days Post-Op. Right upper extremity well perfused with doppler ulnar and radial signals. Motor and sensation intact. Continue local wound care to the  incision. Some upper extremity edema. If able elevate RUE on pillow.  Continue anticoagulation.  Medical management per primary. Primary team discussing [place] with family today  Karoline Caldwell, [facility] [phone] 12/29/2020 9:47 AM   VASCULAR STAFF ADDENDUM: I have independently interviewed and examined the patient. I agree with the above.  Incision clean and dry. 2+ radial pulse Noted plans for palliative evaluation.  Yevonne Aline. Stanford Breed, MD [facility] Phone Number: [phone] 12/29/2020 11:29 AM

## 2020-12-29 NOTE — Progress Notes (Signed)
ANTICOAGULATION CONSULT NOTE  Pharmacy Consult for heparin >> argatroban Indication: acute bilateral DVT, stroke  Heparin Dosing Weight: 91 kg  Labs: Recent Labs    12/27/20 0415 12/28/20 0400 12/28/20 1428 12/29/20 0631  HGB 8.9* 7.9*  --   --   HCT 28.1* 24.7*  --   --   PLT 112* 100*  --   --   HEPARINUNFRC 0.59 0.23* 0.34 0.41  CREATININE 1.82* 1.68*  --  1.36*    Assessment: 89 yom s/p bur hole drainage of complex L chronic SDH last month presenting with acute CVA s/p IR for cerebral arteriogram, emergent mechanical thrombectomy on 3/21. Patient also found to have acute bilateral DVT. PTA apixababn has been held since 11/21/20 with SDH/crani with plan to hold x 6 weeks.  Neurosurgery cleared to begin heparin per stroke protocol on 3/22. Neurology and CCM in agreement. Patient did experience platelet drop to 30s in rehab on heparin SQ with improvement once d/c'd. HIT antibody resulted as negative. Per discussion with CCM, will cautiously start heparin and monitor plt trend for now. SDH appears to be almost fully resolved per Neurosurgery.  Patient went for revascularization 3/23 and heparin was held overnight. Orders 3/24 to resume heparin drip.  3/27 AM update:  -Heparin level remains therapeutic. No further bleeding issues from thrombectomy site reported. Hg low stable at 8.3 and plt down to 85. With patient's previous history of dropping plt on heparin and idiopathic R arm and R ICA re-occlusion, per discussion with CCM, will recheck HIT antibody and transition heparin to argatroban. SCr trending back down.  Goal of Therapy:  aPTT 50-90 seconds, no bolus per stroke protocol Monitor platelets by anticoagulation protocol: Yes   Plan:  D/c heparin drip >> start argatroban 0.5 mcg/kg/min at time of heparin discontinuation - communicated plan with RN Check 4h aPTT from start Monitor daily CBC, s/sx bleeding F/u HIT antibody results   Arturo Morton, PharmD, BCPS Please  check AMION for all [place] contact numbers Clinical Pharmacist 12/29/2020 8:52 AM

## 2020-12-29 NOTE — Procedures (Addendum)
[facility] EEG  HISTORY:  87 year old male presents with left sided weakness.  INTRODUCTION:  A digital EEG was performed in the laboratory using the standard international 10/20 system of electrode placement in addition to one channel of EKG monitoring.  Hyperventilation and Photic Stimulation were not performed.  This tracing captures the patient in the awake and drowsy states.     DESCRIPTION OF RECORD:  In the awake state there is diffuse slowing of the background consisting of 6 Hz frequency activity, which is low amplitude. Continuous generalized slowing in the theta range with intermixed delta is seen throughout the record.  Muscle artifact obscures interpretation of the recording at times.  Single lead EKG could not be interpreted due to artifact.  IMPRESSION:  This is an abnormal adult EEG in the awake and drowsy states. The slowing of the background rhythm and continuous generalized slowing are consistent with an underlying mild encephalopathy.  Alternatively, it could be due to the effect of sedative medications or bilateral cerebral dysfunction.  No epileptiform abnormalities or electroencephalographic seizures are identified in this record.  This does not rule out the diagnosis of epilepsy.  If clinically indicated a repeat EEG or 24 hour EEG may be helpful. Clinical correlation is recommended.

## 2020-12-29 NOTE — Progress Notes (Addendum)
STROKE TEAM PROGRESS NOTE    HPI/Signifcant events:  12/28/20 nurse noted worsening weakness to left side: stat CT head done showed subacute strokes at right basal ganglia, and right temporal and occipital lobes. Pt already on heparin drip. Chest xray done showed no acute airspace disease.    Patient has previous TIA and incidental left cerebellum infarct in 03/2019.  At that time, CT old left MCA infarct, MRA right siphon mild to moderate stenosis, carotid Doppler negative, EF 60 to 65%, LDL 94 and A1c 5.3.  Patient continued on Coumadin and Lipitor 80.  Patient admitted 11/2020 for left large SDH status post burr hole evacuation.  Anticoagulation was on hold.  Patient developed worsening right-sided weakness and aphasia, EEG negative, concerning for seizure activity put on Depakote 1000 mg nightly.  This admission presentation: patient admitted for confusion, fever, urosepsis and bilateral DVT.  However patient developed acute worsening of speech and left-sided weakness in the ED.  Code stroke activated, CT showed left SDH much improved, stable bilateral frontal encephalomalacia and BG lacunar infarcts.  MRI showed small multiple punctate scattered infarcts at right MCA, left CR, left MCA/PCA.  However, found to have an no flow of right ICA.  CT head and neck done showed occlusion of right ICA from bulb to terminal and proximal MCA.  CT perfusion showed large penumbra.  Underwent thrombectomy with TICI3 reperfusion.  CT repeat no bleeding.  3/23 evening  S/p repeated stroke work up late afternoon after patient was found up in chair slumped over and not responsive. Noted to have right gaze and left hemiplegia. CT head no acute change. The pt at that time was able to open eyes on voice stimulation but still not verbal or following commands, still had left hemiplegia.  CTA head and neck showed right M1 occlusion and short segment of right P2 high grade stenosis vs. Occlusion. CTP showed large penumbra. He  was intubated and taken emergently to IR by Dr. Estanislado Pandy for agram/thrombectomy.  Returned to ICU intubated and sedated. Bilat LE DVTs noted 3/23.   3/24 Right axillary artery occlussion with partial thrombus noted early am today. Vascular surgery called and taken to OR today for emergent thrombectomy.  Patient remains intubated and sedated on precedex. Remains weak in the left hemibody and withdraws right hemibody.   INTERVAL HISTORY Extubated 3/24 1600 postop. A fib with RVR on cardizem gtt briefly. Now bradycardic at times. Febrile to 101.2 (CCM plans to monitor). Creatinine trending up to 1.8 in setting of 3 procedures w/contrast.   Somnolent, arouses easily to verbal stimuli. Following simple commands. Moving all extremities. Poor cough effort.   Vitals:   12/29/20 0500 12/29/20 0600 12/29/20 0700 12/29/20 0800  BP: (!) 163/74 (!) 145/65 (!) 165/62 (!) 162/75  Pulse: 76 71 76 85  Resp: (!) 24 (!) 23 (!) 26 (!) 21  Temp:    97.9 F (36.6 C)  TempSrc:    Axillary  SpO2: 100% 100% 100% 100%  Weight:      Height:       CBC:  Recent Labs  Lab 12/28/20 0400 12/29/20 0631  WBC 6.2 6.9  NEUTROABS 4.4 4.7  HGB 7.9* 8.3*  HCT 24.7* 26.6*  MCV 100.4* 105.1*  PLT 100* 85*   Basic Metabolic Panel:  Recent Labs  Lab 12/28/20 0400 12/28/20 1618 12/29/20 0631  NA 144  --  144  K 3.8  --  4.0  CL 114*  --  116*  CO2 21*  --  22  GLUCOSE 132*  --  120*  BUN 36*  --  39*  CREATININE 1.68*  --  1.36*  CALCIUM 7.9*  --  7.8*  MG 2.5* 2.4  --   PHOS 2.7 2.1*  --    Lipid Panel:  Recent Labs  Lab 12/29/2020 1625 12/24/20 0514 12/21/2020 0522  CHOL 115  --   --   TRIG 68   < > 200*  HDL 41  --   --   CHOLHDL 2.8  --   --   VLDL 14  --   --   LDLCALC 60  --   --    < > = values in this interval not displayed.   HgbA1c:  Recent Labs  Lab 12/18/2020 1625  HGBA1C 5.4   IMAGING past 24 hours  CT HEAD WO CONTRAST  Result Date: 12/24/2020 IMPRESSION:  1. Stable non  contrast CT appearance of the brain since yesterday. Multifocal chronic ischemia with no acute or evolving infarct by CT.  2. No acute intracranial hemorrhage. Stable mixed density left subdural hematoma.    MR BRAIN WO CONTRAST  Result Date: 12/10/2020 IMPRESSION: Multiple small areas of acute infarct in the right cerebral hemisphere. Interval resolution of small infarct in the left hippocampus. Loss of flow void right internal carotid artery compatible with interval occlusion Subacute subdural hematoma left 7 mm, unchanged.   CTA head/neck 12/08/2020   CTA neck:  1. The cervical right ICA becomes occluded or near occluded at its proximal to mid aspect. The vessel remains occluded or near occluded throughout the remainder of the neck.  2. Left CCA and ICA patent within the neck with less than 50% stenosis.  3. Vertebral arteries patent within the neck bilaterally.  4. Moderate/severe stenosis at the origin of the dominant left vertebral artery.   CTA head:  1. The right ICA remains occluded or near occluded intracranially throughout the siphon region. There is reconstitution of enhancement within this vessel at the level of the supraclinoid segment.  2. High-grade focal stenosis within an inferior division proximal M2 right MCA branch vessel.  3. Atherosclerotic irregularity of additional proximal to mid right M2 MCA branch vessels. Most notably, there is segmental high-grade stenosis within a superior division mid M2 right MCA branch.  4. 2 mm medially projecting vascular protrusion arising from the supraclinoid right ICA. This may reflect an aneurysm, infundibulum or the origin of an otherwise occluded right posterior communicating artery.   CEREBRAL PERFUSION W CONTRAST CT perfusion head: The perfusion software identifies no core infarct. The small acute infarcts demonstrated on the brain MRI performed earlier today are not detected. 109 mL region of hypoperfusion, predominantly within the  right cerebral hemisphere.    PHYSICAL EXAM GENERAL: Elderly male resting in bed on nasal cannula in NAD. Family at bedside providing support.   HEENT: - Normocephalic and atraumatic, dry mm,  LUNGS - Normal respiratory effort. SaO2 stable.  CV - A fib on tele ABDOMEN - Soft, nontender Ext: warm, well perfused  NEURO:  Mental Status: eyes open to loud noises.. Arouses with verbal stimuli briefly to follow simple commands. Able to state his name today; doesn't recognize wife but able to name daughter at bedside.  . Brief one or two word verbal responses.  Speech is dysarthric but able to be understood.  Cranial Nerves:  II: PERR 2 mm/brisk. Unable to assess visual fields III, IV, VI: Does not follow or track across midline to the left. Gazes  to the right. V: Does not blink to threat of the left side VII:  left facial droop. VIII: Hearing significantly diminished IX, X: unable to assess  XI: Lifts RUE inconsistently to command and spontaneously. Weak attempts to move LUE. Weakly moves BLE spontaneously.  XII: Head resting at midline. turned to the right.  Motor: At least 3/5 in RLE and good grip in the R hand. 1-2/5 in LUE/ LLE to noxious stimuli Coordination:Unable to assess due to AMS Gait- deferred  ASSESSMENT/PLAN Mr. Cesar [name] is a 87 y.o. male with history of A. fib, CVA, seizure disorder, history of DVT, hyperlipidemia, chronic kidney disease stage III, severe AS status post TAVR in January 2022, OSA not on CPAP, nonobstructive CAD presenting with confusion. Was found to have multiple small areas of acute infarct in the right cerebral hemisphere and right ICA and right MCA occlusions.  He underwent emergent mechanical thrombectomy of right ICA terminus and right MCA occlusions achieving a TICI 3 revascularization     Stroke:  right MCA and left CR, left MCA/PCA punctate scattered infarcts with right ICA and proximal MCA occlusions s/p IR with TICI 3 revasculariztion. These  events occurred in the setting of known atrial fibrillation off anticoagulation due to recent burr hole surgery for SDH.    Code Stroke CT head no acute intracranial hemorrhage  CTA head right ICA remains occluded, High-grade stenosis of M2 right MCA branch vessel.   CTA neck cervical right ICA becomes occluded or near occluded at its proximal to mid aspect. The vessel remains occluded or near occluded throughout the remainder of the neck. Left CCA and ICA patent within the neck with less than 50% stenosis.   CT perfusion no core infarct but large penumbra.  CT head repeat no bleeding  MRI  Multiple small areas of acute infarct in the right cerebral hemisphere. As well as left CR and left MCA/PCA punctate infarcts  MRA Degraded by motion, Right ICA is now patent. Suspect moderate narrowing at the Clinoid  2D Echo: EF 60-65%, Left atrial size was severely dilated. Mild LVH. Left ventricular diastolic  function could not be evaluated due to atrial fibrillation.  LDL 60  HgbA1c 5.4  VTE prophylaxis - heparin IV infusion  aspirin 81 mg daily prior to admission, now on heparin IV.   Recurrent right MCA infarct due to right M1 occlusion s/p IR with TICI3   Acute onset AMS, right gaze and left hemiplegia  CT head no acute change.  CTA head and neck showed right M1 occlusion and short segment of right P2 high grade stenosis vs. Occlusion.   CTP showed large penumbra.  S/p IR 3/23 with TICI3  MRI Interval development of multiple new acute ischemic infarcts involving the right cerebral hemisphere, primarily involving the right temporal lobe, consistent with recent right M1 occlusion.  MRA Interval revascularization of previously identified right M1 occlusion, with wide patency of the right MCA now seen.  Now on heparin IV  Therapy recommendations:  Pending overall stability and progress, very eventful last few days s/p 3 procedures.   Disposition:  TBD  Right axillary A  occlusion with suspected cardiac embolus  Right axillary artery occluded with partial thrombus on doppler  S/p LUE thrombectomy 3/24 with Dr. Carlis Abbott   Now RUE warm and pulse intact  Incision bleeding 3/25 - VVS on board   Seizure history  11/2020 due to SDH, discharged with depakote 500 bid  This admission Depakote level 44->41->65->38->54  Loaded with  Depakote 1500mg  x3  Continue Depakote 500mg  bid -> Q8h  EEG: mild-moderate diffuse encephalopathy, with possible/not definitive increased areas of focal neuronal dysfunction in the right (artifact is more favored) and possibly in the anterior regions (bilaterally).  There are no clear epileptiform abnormalities.  Bilateral lower extremity DVTs  History of blood clots, prior DVT 2006  This admission LE venous doppler - R posterior tibial and peroneal veins and L peroneal veins DVT.  Heparin infusion per stroke protocol -  Goal level Heparin level 0.3-0.5 units/ml  Atrial fibrillation  Previously on coumadin -> eliquis -> Xarelto, held due to SDH s/p craniotomy 11/2020  On heparin IV infusion: heparin level today .41  Monitor for HIT  Hgb today 7.9  Platelet 100  Echo EF 60-65%  Rate controlled  SDH  11/2020 left SDH status post evacuation  Anticoagulation was on hold, plan to resume in 6 weeks  This admission CT head left SDH much improved  History of stroke  03/2019 admitted for left-sided weakness, left hemianopia, MRI showed only incidental small left cerebellar infarct.  Diagnosed with right brain TIA. CT showed no acute finding, but old left MCA infarct. MCA showed right siphon mild to moderate stenosis.  Carotid Doppler negative.  EF 60 to 65%.  LDL 94, A1c 5.3.  Continued on Coumadin and Lipitor 80   Dysphagia  NPO now  S/p cortrak placement  On TF and IVF.    Urosepsis  Febrile on admission 101.9 -> 101.2  WBC today 6.2  UA: +nitrite, +LE, WBC >50  Urine culture: ecoli  Recent ecoli uti  (12/06/20)  Received vancomycin IV x1, was on cefepime -> now off  Hypertension->hypotension  Home meds:  Amlodipine 5mg  daily  BP soft . On TF and IVF  Hyperlipidemia  Home meds:  Atorvastatin 80mg  daily, resumed in hospital  LDL 60, goal < 70  Continue statin at discharge  CKD Stage III  Baseline sCr 1.3-1.5.  Cre now 1.36   Other Stroke Risk Factors  Advanced Age >/= 65   Overweight, Body mass index is 26.41 kg/m., recommend weight loss, diet and exercise as appropriate   Hx stroke  Family hx stroke (father stroke)  Coronary artery disease  Abdominal aortic aneurysm  TAVR 10/2020  Other Active Problems  GERD: protonix  BPH: proscar    Dennison Mascot, PA-C Discussed with Dr. Merlene Laughter.     Episode of worsening left-sided weakness overnight.  Imaging with CT scan shows more consolidation of the right temporal infarct indicating some extension or normal evolution of the previous infarct.  The case is discussed extensively with the son and wife at the bedside.  Overall prognosis guarded given age and multiple comorbidities.  Palliative consult placed and in they are having further discussion with other family members. This patient is critically ill due to stroke post procedure and at significant risk of neurological worsening, death form severe anemia, bleeding, recurrent stroke, intracranial stenosis. This patient's care requires constant monitoring of vital signs, hemodynamics, respiratory and cardiac monitoring, review of multiple databases, neurological assessment, other specialists and medical decision making of high complexity. I spent 35 minutes of neurocritical care time in the care of this patient

## 2020-12-29 NOTE — Progress Notes (Signed)
Spoke with family. Discussed my concerns regarding lack of bouncing back like first ICA occlusion. This still may be recoverable just now needs time. They would like him to be DNR which is reasonable. Palliative consult pending.  Erskine Emery MD PCCM

## 2020-12-29 NOTE — Progress Notes (Signed)
NAME:  Cesar [name], MRN:  [id number], DOB:  11-Feb-1929, LOS: 6 ADMISSION DATE:  12/13/2020, CONSULTATION DATE:  .12/29/20 REFERRING MD:  Cheral Marker, CHIEF COMPLAINT:  Weakness   History of Present Illness:  87 y.o. M with PMH significant for AAA, stage III CKD, OSA, seizures, OSA, severe AS s/p TAVR 10/22/20, permanent Afib who has recently admitted for TAVR and discharged on [facility] and Eliquis, re-admitted 2/15 with large, chronic subdural hematoma after falls and underwent bur hole drainage with drain placement.  His anti-coagulation was held until 2/21  and he was discharged to rehab 2/28 ( [facility])  and then to home on 3/17.      He then presented to the ED on 3/21 with generalized weakness and fever to 101.9 and was found to have UTI and bilateral DVT's MRI showed possible internal carotid artery occlusion on the right and right MCA CVA.   His initial head CT did not show acute changes, but throughout ED course he became increasingly confused and had slurred speech. Pt. Also has a seizure disorder , EEG did not show seizure activity Depakote was subtherapeutic, and he was loaded.  CTA showed occluded R ICA, so was taken emergently to IR, Underwent thrombectomy of ICA and right MCA and was admitted and transported to ICU intubated , and stable  Pertinent  Medical History   has a past medical history of AAA (abdominal aortic aneurysm) ([place]), CKD (chronic kidney disease), stage III ([place]), CVA (cerebral infarction), Depression, DVT (deep venous thrombosis) ([place]) (2006), H/O blood clots, Hematuria, HOH (hard of hearing), Hypercholesterolemia, OSA (obstructive sleep apnea), Permanent atrial fibrillation ([place]), Prostate enlargement, Pulmonary nodules, S/P TAVR (transcatheter aortic valve replacement) (10/22/2020), Thoracic ascending aortic aneurysm ([place]), and TIA (transient ischemic attack).   Significant Hospital Events: Including procedures, antibiotic start and stop dates in addition to other pertinent  events   . Admit to hospitalists, worsening neuro status and found to have occluded R ICA, taken to IR and then ICU, PCCM consult . 3/22 extubated. Cefepime for e coli uti  . 3/23 remains stable following extubation, not on any vasoactive infusions. PCCM to sign off. Found unresponsive and slumped over.  CTA head/neck showed R M1 occlusion and R P2 high grade stenosis vs occlusion, CTP large penumbra taken back emergently to [facility] w/complete revascularization of occluded R MCA; returns intubated.  R arm also had a clot, underwent thrombectomy . 3/24 extubated in event . 3/25 >> pulmonary toileting issues; repeat head imaging shows evolution of known R showering infarcts after thrombectomy . 3/27 switch to argatroban, send HIT panel  Interim History / Subjective:  Not really bouncing back. Remains lethargic with weak cough.  Objective   Blood pressure (!) 145/65, pulse 71, temperature 97.9 F (36.6 C), temperature source Axillary, resp. rate (!) 23, height 6\' 1"  (1.854 m), weight 90.8 kg, SpO2 100 %.        Intake/Output Summary (Last 24 hours) at 12/29/2020 0717 Last data filed at 12/29/2020 0600 Gross per 24 hour  Intake 2520 ml  Output 3400 ml  Net -880 ml   Filed Weights   12/04/2020 2010 12/28/20 0300 12/29/20 0400  Weight: 91 kg 91.1 kg 90.8 kg   Constitutional: frail elderly man lying in bed  Eyes: EOMI, pupils equal Ears, nose, mouth, and throat: trachea midline, MMM Cardiovascular: irregular, ext warm Respiratory: scattered rhonci and transmitted upper airway sounds Gastrointestinal: soft, +BS Skin: R arm thrombectomy site looks good Neurologic: he will not follow simple commands for  me but does seem to answer questions appropriately stating he is in no pain and has no shortness of breath Psychiatric: RASS 0  Labs/imaging that I havepersonally reviewed   Cr improved Plts drifting down more  Resolved Hospital Problem list   Afib RVR E coli UTI s/p 3 days tx    Assessment & Plan:  Acute R ICA stroke x 2 both removed by thrombectomy, some residual small CVAs on MRI  Acute LE DVT bilaterally; no evidence of intracardiac shunt with TTE/bubble  Acute hypoxemic respiratory failure due to inability to protect airway, extubated 3/24 but lingering issues with secretion clearance.  Some volume overload complicating as well as cannot rule out PE from LE clots. Acute kidney injury after dye load x 2 - improved R brachial clot post thrombectomy  Muscular deconditioning  Recent SDH s/p burr hole stable on imaging  Hx seizures on VPA  Severe AS s/p TAVR  Urinary retention on proscar (home med)   - Continue pulmonary toileting as ordered - Aggressive PT/OT - With platelet drop both now and last time he had heparin as well as idiopathic R arm and R ICA re-occlusion I think we are obligated to switch to argatroban and recheck HIT panel; he had similar drop in past but PF4 was neg at that time - Watch for further s/s of bleeding  In R arm - Dose of metolazone, continue free water - Trial of modafenil - Given age, clinical appearance, and languishing after extubation, will ask palliative to come on board to establish relationship and help manage family expectations - Keep in ICU while resp status so tenuous  Best practice (evaluated daily)  Diet:  TF Pain/Anxiety/Delirium protocol (if indicated): No VAP protocol (if indicated): Not indicated DVT prophylaxis: Systemic AC  GI prophylaxis: PPI Glucose control:  SSI Yes Central venous access:  N/A Arterial line:  out Foley:  out Mobility:  OOB -- pt   PT consulted: Yes Last date of multidisciplinary goals of care discussion [per primary] Code Status:  full code Disposition: ICU   Patient critically ill due to recurrent stroke, acute hypoxemic respiratory failure Interventions to address this today intubation watch, family discussions, pulmonary toileting, diuresis Risk of deterioration without these  interventions is high  I personally spent 39 minutes providing critical care not including any separately billable procedures  Erskine Emery MD [facility]  Prefer epic messenger for cross cover needs If after hours, please call E-link

## 2020-12-29 NOTE — Consult Note (Addendum)
Palliative Medicine Inpatient Consult Note  Reason for consult:  Goals of Care "FTT after stroke x 2"  HPI:  Per intake H&P --> 87 y.o. M with PMH significant for AAA, stage III CKD, OSA, seizures, OSA, severe AS s/p TAVR 10/22/20, permanent Afib who has recently admitted for TAVR and discharged on Asa and Eliquis, re-admitted 2/15 with large, chronic subdural hematoma after falls and underwent bur hole drainage with drain placement. He then presented to the ED on 3/21 with generalized weakness and fever to 101.9 and was found to have UTI and bilateral DVT's MRI showed possible internal carotid artery occlusion on the right and right MCA CVA. CTA showed occluded R ICA, so was taken emergently to IR, Underwent thrombectomy of ICA and right MCA   Palliative care has been asked to get involved to further discuss goals of care in the setting of multiple co-morbidities and a lack of great improvement during hospitalization. He now has ongoing failure to thrive with a coretrack in place.  Clinical Assessment/Goals of Care:  *Please note that this is a verbal dictation therefore any spelling or grammatical errors are due to the "[employer]" system interpretation.  I have reviewed medical records including EPIC notes, labs and imaging, received report from bedside RN, assessed the patient who was able to say a few words which were indescernable.    I met with Cesar [name] (via speaker-phone), Cesar [name] (son), and Cesar [name] (spouse)  to further discuss diagnosis prognosis, GOC, EOL wishes, disposition and options.   I introduced Palliative Medicine as specialized medical care for people living with serious illness. It focuses on providing relief from the symptoms and stress of a serious illness. The goal is to improve quality of life for both the patient and the family.  [name] shares with me that her husband is from [place], [place] to be more specific.  They have been  married for the past 35 years.  They share 2 children a son Cesar [name] and a daughter Cesar [name].  They have 2 grandchildren and 3 great-grandchildren.  Kyzer used to work in [employer].  He is identified to have been an extremely independent man with an exceptionally strong will.  His family states that he was still using a tractor and chain saw up until very recently.  He is a man of faith and practices within the [facility] denomination.  Prior to hospitalization Draiden had over the last few months suffered a variety of deficits and was noted to need a walker.  He was living with his wife and did require some help with basic activities of daily living but otherwise was fairly able.  Cesar [name]'s family share with me that his health journey has been declining for about the past 3 years.  He suffered a minor stroke, atrial fibrillation, and aortic aneurysm which was identified to be stable, and multiple blood clots. They share that in the past few months his gait has been poorer and he has "shuffled" more often than not.  A detailed discussion was had today regarding advanced directives -although we do not have any on file Cesar [name]'s family makes decisions collectively.  There is, however, noted to be a desire for natural death in Cesar [name].   Concepts specific to code status, artifical feeding and hydration, continued IV antibiotics and rehospitalization was had. Siyon is now DNAR/DNI with no extreme measure to maintain life.    The difference between a aggressive medical intervention path and a palliative comfort care  path for this patient at this time was had.  Given Cesar [name]'s independent nature his family do not feel that the present quality of life he is projected to have would be consistent with what he would want for himself.  We discussed alternatives to aggressive care which are those of comfort focus. We talked about transition to comfort measures in house and what that would entail inclusive of  medications to control pain, dyspnea, agitation, nausea, itching, and hiccups.  We discussed stopping all uneccessary measures such as blood draws, needle sticks, and frequent vital signs. Cesar [name]'s family share that they believe this is more consistent with which he will want.  They share that he has never been the type of man to "want a lot".  They lament that he has lived a long and strong life.  Huberts family have requested a night to speak as a family and for Cesar [name] all to meet again tomorrow at 67 AM for further decisions.  Discussed the importance of continued conversation with family and their  medical providers regarding overall plan of care and treatment options, ensuring decisions are within the context of the patients values and GOCs.  Decision Maker: Cesar [name] (spouse) [phone]  SUMMARY OF RECOMMENDATIONS   DNAR/DNI  Patients family would like to commence amongst themselves this evening to further discuss patient goals though they are largely leaning towards comfort focused care  Palliative care plans to meet with Suheyb's family at bedside tomorrow at 11AM  Ongoing PMT support  Code Status/Advance Care Planning: DNAR/DNI   Palliative Prophylaxis:   Oral Care, Mobility  Additional Recommendations (Limitations, Scope, Preferences):  Continue to treat what is treatable for now  Psycho-social/Spiritual:   Desire for further Chaplaincy support: Patients pastor will be coming to bedside today  Additional Recommendations: Education on what comfort care entails   Prognosis: Poor in the setting of recurrent readmission, FTT, multiple strokes, decreased functional abilities.  Discharge Planning: Unclear  Vitals:   12/29/20 1200 12/29/20 1300  BP: (!) 145/82 (!) 148/69  Pulse: 72 71  Resp: 20 (!) 25  Temp:    SpO2: 100% 100%    Intake/Output Summary (Last 24 hours) at 12/29/2020 1405 Last data filed at 12/29/2020 1300 Gross per 24 hour  Intake 2832.82 ml   Output 3750 ml  Net -917.18 ml   Last Weight  Most recent update: 12/29/2020  4:11 AM   Weight  90.8 kg (200 lb 2.8 oz)           Gen:  Ill appearing elderly M  HEENT: Coretrack in Place Dry mucous membranes CV: Regular rate and irregular rhythm  PULM: On 4LPM [place] ABD: soft/nontender  EXT:(+) pedal edema Neuro: Alert to self  PPS: 10%   This conversation/these recommendations were discussed with patient primary care team, Dr. Tamala Julian  Time In: 1300 Time Out: 1410 Total Time: 70 Greater than 50%  of this time was spent counseling and coordinating care related to the above assessment and plan.  [place] Team Team Cell Phone: [phone] Please utilize secure chat with additional questions, if there is no response within 30 minutes please call the above phone number  Palliative Medicine Team providers are available by phone from 7am to 7pm daily and can be reached through the team cell phone.  Should this patient require assistance outside of these hours, please call the patient's attending physician.

## 2020-12-30 DIAGNOSIS — N3001 Acute cystitis with hematuria: Secondary | ICD-10-CM | POA: Diagnosis not present

## 2020-12-30 DIAGNOSIS — E440 Moderate protein-calorie malnutrition: Secondary | ICD-10-CM

## 2020-12-30 DIAGNOSIS — Z7189 Other specified counseling: Secondary | ICD-10-CM

## 2020-12-30 DIAGNOSIS — Z515 Encounter for palliative care: Secondary | ICD-10-CM

## 2020-12-30 DIAGNOSIS — N390 Urinary tract infection, site not specified: Secondary | ICD-10-CM | POA: Diagnosis not present

## 2020-12-30 DIAGNOSIS — J9601 Acute respiratory failure with hypoxia: Secondary | ICD-10-CM | POA: Diagnosis not present

## 2020-12-30 DIAGNOSIS — G9341 Metabolic encephalopathy: Secondary | ICD-10-CM | POA: Diagnosis not present

## 2020-12-30 DIAGNOSIS — I6601 Occlusion and stenosis of right middle cerebral artery: Secondary | ICD-10-CM | POA: Diagnosis not present

## 2020-12-30 LAB — CBC WITH DIFFERENTIAL/PLATELET
Abs Immature Granulocytes: 0.17 10*3/uL — ABNORMAL HIGH (ref 0.00–0.07)
Basophils Absolute: 0 10*3/uL (ref 0.0–0.1)
Basophils Relative: 0 %
Eosinophils Absolute: 0.4 10*3/uL (ref 0.0–0.5)
Eosinophils Relative: 5 %
HCT: 26.5 % — ABNORMAL LOW (ref 39.0–52.0)
Hemoglobin: 8.6 g/dL — ABNORMAL LOW (ref 13.0–17.0)
Immature Granulocytes: 2 %
Lymphocytes Relative: 12 %
Lymphs Abs: 0.9 10*3/uL (ref 0.7–4.0)
MCH: 32.5 pg (ref 26.0–34.0)
MCHC: 32.5 g/dL (ref 30.0–36.0)
MCV: 100 fL (ref 80.0–100.0)
Monocytes Absolute: 1.2 10*3/uL — ABNORMAL HIGH (ref 0.1–1.0)
Monocytes Relative: 18 %
Neutro Abs: 4.3 10*3/uL (ref 1.7–7.7)
Neutrophils Relative %: 63 %
Platelets: 90 10*3/uL — ABNORMAL LOW (ref 150–400)
RBC: 2.65 MIL/uL — ABNORMAL LOW (ref 4.22–5.81)
RDW: 15.2 % (ref 11.5–15.5)
WBC: 7 10*3/uL (ref 4.0–10.5)
nRBC: 0 % (ref 0.0–0.2)

## 2020-12-30 LAB — BASIC METABOLIC PANEL
Anion gap: 6 (ref 5–15)
BUN: 39 mg/dL — ABNORMAL HIGH (ref 8–23)
CO2: 24 mmol/L (ref 22–32)
Calcium: 8 mg/dL — ABNORMAL LOW (ref 8.9–10.3)
Chloride: 112 mmol/L — ABNORMAL HIGH (ref 98–111)
Creatinine, Ser: 1.48 mg/dL — ABNORMAL HIGH (ref 0.61–1.24)
GFR, Estimated: 45 mL/min — ABNORMAL LOW (ref >60)
Glucose, Bld: 116 mg/dL — ABNORMAL HIGH (ref 70–99)
Potassium: 4 mmol/L (ref 3.5–5.1)
Sodium: 142 mmol/L (ref 135–145)

## 2020-12-30 LAB — VALPROIC ACID LEVEL: Valproic Acid Lvl: 71 ug/mL (ref 50.0–100.0)

## 2020-12-30 LAB — GLUCOSE, CAPILLARY
Glucose-Capillary: 119 mg/dL — ABNORMAL HIGH (ref 70–99)
Glucose-Capillary: 125 mg/dL — ABNORMAL HIGH (ref 70–99)

## 2020-12-30 LAB — APTT: aPTT: 78 seconds — ABNORMAL HIGH (ref 24–36)

## 2020-12-30 LAB — HEPARIN INDUCED PLATELET AB (HIT ANTIBODY): Heparin Induced Plt Ab: 0.082 OD (ref 0.000–0.400)

## 2020-12-30 MED ORDER — BISACODYL 10 MG RE SUPP: 10.0000 mg | Freq: Every day | RECTAL | Status: DC | PRN

## 2020-12-30 MED ORDER — HYDROMORPHONE HCL 1 MG/ML IJ SOLN
1.0000 mg | Freq: Four times a day (QID) | INTRAMUSCULAR | Status: DC
  Administered 2020-12-30: 1 mg via INTRAVENOUS
  Filled 2020-12-30: qty 1

## 2020-12-30 MED ORDER — ONDANSETRON HCL 4 MG/2ML IJ SOLN: 4.0000 mg | Freq: Four times a day (QID) | INTRAMUSCULAR | Status: DC | PRN

## 2020-12-30 MED ORDER — LORAZEPAM 2 MG/ML IJ SOLN: 1.0000 mg | INTRAMUSCULAR | Status: DC | PRN

## 2020-12-30 MED ORDER — MORPHINE SULFATE (PF) 2 MG/ML IV SOLN: 2.0000 mg | INTRAVENOUS | Status: DC | PRN

## 2020-12-30 MED ORDER — BIOTENE DRY MOUTH MT LIQD: 15.0000 mL | OROMUCOSAL | Status: DC | PRN

## 2020-12-30 MED ORDER — GLYCOPYRROLATE 0.2 MG/ML IJ SOLN
0.4000 mg | INTRAMUSCULAR | Status: DC
  Administered 2020-12-30 (×2): 0.4 mg via INTRAVENOUS
  Filled 2020-12-30 (×2): qty 2

## 2020-12-30 MED ORDER — POLYVINYL ALCOHOL 1.4 % OP SOLN: 1.0000 [drp] | OPHTHALMIC | Status: DC | PRN

## 2020-12-30 MED ORDER — SODIUM CHLORIDE 0.9 % IV SOLN
1.0000 mg/h | INTRAVENOUS | Status: DC
  Administered 2020-12-30: 1 mg/h via INTRAVENOUS
  Filled 2020-12-30: qty 2.5

## 2020-12-30 MED ORDER — HYDROMORPHONE BOLUS VIA INFUSION
1.0000 mg | INTRAVENOUS | Status: DC | PRN
  Filled 2020-12-30: qty 1

## 2020-12-30 MED ORDER — LORAZEPAM 2 MG/ML IJ SOLN
1.0000 mg | INTRAMUSCULAR | Status: DC | PRN
  Administered 2020-12-30: 1 mg via INTRAVENOUS
  Filled 2020-12-30: qty 1

## 2020-12-30 MED ORDER — HYDROMORPHONE HCL 1 MG/ML IJ SOLN: 1.0000 mg | INTRAMUSCULAR | Status: DC | PRN

## 2021-01-02 ENCOUNTER — Encounter: Payer: Medicare Other | Admitting: Physical Medicine & Rehabilitation

## 2021-01-03 NOTE — Progress Notes (Signed)
Inpatient Rehab Admissions Coordinator: . Spoke with family at bedside. Pt is now comfort care. Will sign off.   Gayland Curry, [place], [place] Admissions Coordinator [phone]

## 2021-01-03 NOTE — Progress Notes (Addendum)
[facility]  Subjective  - Opens eyes to voice.   Objective (!) 143/64 93 99.1 F (37.3 C) (Axillary) (!) 21 98%  Intake/Output Summary (Last 24 hours) at 2021/01/28 0716 Last data filed at January 28, 2021 0700 Gross per 24 hour  Intake 2943.21 ml  Output 4025 ml  Net -1081.79 ml    Right UE warm, edema + Compartments soft, incision healing well Non labored breathing  Assessment/Planning: 87 y.o. male is s/p right upper extremity embolectomy 4 Days Post-Op.   Palliative plan of care DNR, plans to speak with family today. Right UE warm  With healing incision.  Stable on anticoagulation argatroban.  Roxy Horseman 01-28-21 7:16 AM --  Laboratory Lab Results: Recent Labs    12/29/20 0631 01/28/21 0251  WBC 6.9 7.0  HGB 8.3* 8.6*  HCT 26.6* 26.5*  PLT 85* 90*   BMET Recent Labs    12/29/20 0631 01-28-2021 0251  NA 144 142  K 4.0 4.0  CL 116* 112*  CO2 22 24  GLUCOSE 120* 116*  BUN 39* 39*  CREATININE 1.36* 1.48*  CALCIUM 7.8* 8.0*    COAG Lab Results  Component Value Date   INR 1.3 (H) 11/19/2020   INR 1.1 10/22/2020   INR 6.1 (HH) 08/10/2019   No results found for: PTT  I have seen and evaluated the patient. I agree with the PA note as documented above.  Postop day 4 status post right upper extremity thrombectomy.  Incision looks good.  Has palpable radial pulse at the wrist.  Currently on argatroban with a HIT panel pending.  Platelet count improved to 90 today.  Hand appears well perfused.  Marty Heck, MD [facility] Office: [phone]

## 2021-01-03 NOTE — Discharge Summary (Signed)
Patient ID: Cesar [name] MRN: [id number] DOB/AGE: Apr 19, 1929 87 y.o.  Admit date: 01/22/21 Death date: 29-Jan-2021 at 01-22-03  Admission Diagnoses: Weakness and fall  Cause of Death: Respiratory failure due to large right MCA stroke with cerebral edema due to right carotid occlusion treated with thrombectomy followed later by right middle cerebral artery occlusion also requiring a second thrombectomy due to atrial fibrillation.  Patient subsequently developed respiratory failure after extubation requiring reintubation.  Patient made DNR and comfort care by family and respiratory support withdrawn  Pertinent Medical Diagnosis: Principal Problem:   UTI (urinary tract infection) Active Problems:   Permanent atrial fibrillation (HCC)   CKD (chronic kidney disease), stage III (HCC)   Severe sepsis (HCC)   Acute metabolic encephalopathy   Stroke [facility])   Middle cerebral artery embolism, right   Acute hypoxemic respiratory failure (HCC)   Malnutrition of moderate degree   Counseling regarding advance care planning and goals of care   Palliative care by specialist Cytotoxic edema Right carotid occlusion Bilateral lower extremity deep vein thrombosis 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  However patient developed acute worsening of speech and left-sided weakness in the ED.  Code stroke activated, CT showed left SDH much improved, stable bilateral frontal encephalomalacia and BG lacunar infarcts.  MRI showed small multiple punctate scattered infarcts at right MCA, left CR, left MCA/PCA.  However, found to have an no flow of right ICA.  CT head and neck done showed occlusion of right ICA from bulb to terminal and proximal MCA.  CT perfusion showed large penumbra.  Underwent thrombectomy with TICI3 reperfusion.  CT repeat no bleeding.3/23 evening  S/p repeated  stroke work up late afternoon after patient was found up in chair slumped over and not responsive. Noted to have right gaze and left hemiplegia. CT head no acute change. The pt at that time was able to open eyes on voice stimulation but still not verbal or following commands, still had left hemiplegia.  CTA head and neck showed right M1 occlusion and short segment of right P2 high grade stenosis vs. Occlusion. CTP showed large penumbra. He was intubated and taken emergently to IR by Dr. Estanislado Pandy for agram/thrombectomy.  Developed bilateral lower extremity DVTs noted on 12/09/2020.  Patient developed atrial fibrillation with rapid ventricular rate on 12/17/2020 and started on Cardizem drip.  His neurological exam remained poor after this and he was barely following commands and remained weak all over.  Palliative care team was consulted and after discussion family agreed to DNR and comfort care measures only.  Patient was kept comfortable and passed away shortly thereafter. Signed: Antony Contras 12/31/2020, 4:51 PM

## 2021-01-03 NOTE — Progress Notes (Signed)
STROKE TEAM PROGRESS NOTE   HPI/Signifcant events: Patient has previous TIA and incidental left cerebellum infarct in 03/2019.  At that time, CT old left MCA infarct, MRA right siphon mild to moderate stenosis, carotid Doppler negative, EF 60 to 65%, LDL 94 and A1c 5.3.  Patient continued on Coumadin and Lipitor 80.  Patient admitted 11/2020 for left large SDH status post burr hole evacuation.  Anticoagulation was on hold.  Patient developed worsening right-sided weakness and aphasia, EEG negative, concerning for seizure activity put on Depakote 1000 mg nightly.  This admission presentation: patient admitted for confusion, fever, urosepsis and bilateral DVT.  However patient developed acute worsening of speech and left-sided weakness in the ED.  Code stroke activated, CT showed left SDH much improved, stable bilateral frontal encephalomalacia and BG lacunar infarcts.  MRI showed small multiple punctate scattered infarcts at right MCA, left CR, left MCA/PCA.  However, found to have an no flow of right ICA.  CT head and neck done showed occlusion of right ICA from bulb to terminal and proximal MCA.  CT perfusion showed large penumbra.  Underwent thrombectomy with TICI3 reperfusion.  CT repeat no bleeding.  3/23 evening  S/p repeated stroke work up late afternoon after patient was found up in chair slumped over and not responsive. Noted to have right gaze and left hemiplegia. CT head no acute change. The pt at that time was able to open eyes on voice stimulation but still not verbal or following commands, still had left hemiplegia.  CTA head and neck showed right M1 occlusion and short segment of right P2 high grade stenosis vs. Occlusion. CTP showed large penumbra. He was intubated and taken emergently to IR by Dr. Estanislado Pandy for agram/thrombectomy.  Returned to ICU intubated and sedated. Bilat LE DVTs noted 3/23.   3/24 Right axillary artery occlussion with partial thrombus noted early am today. Vascular  surgery called and taken to OR today for emergent thrombectomy.  Patient remains intubated and sedated on precedex. Remains weak in the left hemibody and withdraws right hemibody.   3/26 Extubated 3/24 1600 postop. A fib with RVR on cardizem gtt briefly. Now bradycardic at times. Febrile to 101.2 (CCM plans to monitor). Creatinine trending up to 1.8 in setting of 3 procedures w/contrast.   3/27 Episode of worsening left-sided weakness overnight.  Imaging with CT scan shows more consolidation of the right temporal infarct indicating some extension or normal evolution of the previous infarct.  The case is discussed extensively with the son and wife at the bedside.  Overall prognosis guarded given age and multiple comorbidities.  Palliative consult placed   INTERVAL HISTORY No acute events overnight Remains somnolent. Following some commands. Mildy tachypnea in 20s on RA.  Ongoing discussions with care teams and palliative consultants regarding goals of care. Meeting 1100 today. Status changed to DNR.  Wife and daughter at bedside. Discussed plan of care, palliative consult and current status. Daughter shares that she feels focus of care should be comfort as she knows her Dad was a strong man who would not want to be helpless and require constant care. Wife states decision making is very hard. She is tearful during our discussion.   Vitals:   01-26-2021 0500 2021/01/26 0600 2021/01/26 0700 01/26/2021 0800  BP: 131/67 (!) 120/47 (!) 143/64 (!) 128/55  Pulse: (!) 32 64 93 86  Resp: (!) 26 (!) 26 (!) 21 (!) 25  Temp:    99.3 F (37.4 C)  TempSrc:    Axillary  SpO2: 95% 100% 98% 99%  Weight:      Height:       CBC:  Recent Labs  Lab 12/29/20 0631 January 16, 2021 0251  WBC 6.9 7.0  NEUTROABS 4.7 4.3  HGB 8.3* 8.6*  HCT 26.6* 26.5*  MCV 105.1* 100.0  PLT 85* 90*   Basic Metabolic Panel:  Recent Labs  Lab 12/28/20 0400 12/28/20 1618 12/29/20 0631 2021/01/16 0251  NA 144  --  144 142  K 3.8  --   4.0 4.0  CL 114*  --  116* 112*  CO2 21*  --  22 24  GLUCOSE 132*  --  120* 116*  BUN 36*  --  39* 39*  CREATININE 1.68*  --  1.36* 1.48*  CALCIUM 7.9*  --  7.8* 8.0*  MG 2.5* 2.4  --   --   PHOS 2.7 2.1*  --   --    Lipid Panel:  Recent Labs  Lab 12/22/2020 1625 12/24/20 0514 12/05/2020 0522  CHOL 115  --   --   TRIG 68   < > 200*  HDL 41  --   --   CHOLHDL 2.8  --   --   VLDL 14  --   --   LDLCALC 60  --   --    < > = values in this interval not displayed.   HgbA1c:  Recent Labs  Lab 01/01/2021 1625  HGBA1C 5.4   IMAGING past 24 hours  CT HEAD WO CONTRAST  Result Date: 12/24/2020 IMPRESSION:  1. Stable non contrast CT appearance of the brain since yesterday. Multifocal chronic ischemia with no acute or evolving infarct by CT.  2. No acute intracranial hemorrhage. Stable mixed density left subdural hematoma.    MR BRAIN WO CONTRAST  Result Date: 12/07/2020 IMPRESSION: Multiple small areas of acute infarct in the right cerebral hemisphere. Interval resolution of small infarct in the left hippocampus. Loss of flow void right internal carotid artery compatible with interval occlusion Subacute subdural hematoma left 7 mm, unchanged.   CTA head/neck 12/13/2020   CTA neck:  1. The cervical right ICA becomes occluded or near occluded at its proximal to mid aspect. The vessel remains occluded or near occluded throughout the remainder of the neck.  2. Left CCA and ICA patent within the neck with less than 50% stenosis.  3. Vertebral arteries patent within the neck bilaterally.  4. Moderate/severe stenosis at the origin of the dominant left vertebral artery.   CTA head:  1. The right ICA remains occluded or near occluded intracranially throughout the siphon region. There is reconstitution of enhancement within this vessel at the level of the supraclinoid segment.  2. High-grade focal stenosis within an inferior division proximal M2 right MCA branch vessel.  3. Atherosclerotic  irregularity of additional proximal to mid right M2 MCA branch vessels. Most notably, there is segmental high-grade stenosis within a superior division mid M2 right MCA branch.  4. 2 mm medially projecting vascular protrusion arising from the supraclinoid right ICA. This may reflect an aneurysm, infundibulum or the origin of an otherwise occluded right posterior communicating artery.   CEREBRAL PERFUSION W CONTRAST CT perfusion head: The perfusion software identifies no core infarct. The small acute infarcts demonstrated on the brain MRI performed earlier today are not detected. 109 mL region of hypoperfusion, predominantly within the right cerebral hemisphere.    PHYSICAL EXAM GENERAL: Elderly male resting in bed on nasal cannula in NAD. Family at bedside providing support.  HEENT: - Normocephalic and atraumatic, dry mm,  LUNGS - Normal respiratory effort. SaO2 stable.  CV - A fib on tele ABDOMEN - Soft, nontender Ext: warm, well perfused  NEURO:  Mental Status: eyes open to loud noises.. Arouses with verbal stimuli briefly to follow simple commands. Able to state his name today; doesn't recognize wife but able to name daughter at bedside.  . Brief one or two word verbal responses.  Speech is dysarthric but able to be understood.  Cranial Nerves:  II: PERR 2 mm/brisk. Unable to assess visual fields III, IV, VI: Does not follow or track across midline to the left. Gazes to the right. V: Does not blink to threat of the left side VII:  left facial droop. VIII: Hearing significantly diminished IX, X: unable to assess  XI: Lifts RUE inconsistently to command and spontaneously. Weak attempts to move LUE. Weakly moves BLE spontaneously.  XII: Head resting at midline. turned to the right.  Motor: At least 3/5 in RLE and good grip in the R hand. 1-2/5 in LUE/ LLE to noxious stimuli Coordination:Unable to assess due to AMS Gait- deferred  ASSESSMENT/PLAN Cesar [name] is a 87 y.o.  male with history of A. fib, CVA, seizure disorder, history of DVT, hyperlipidemia, chronic kidney disease stage III, severe AS status post TAVR in January 2022, OSA not on CPAP, nonobstructive CAD presenting with confusion. Was found to have multiple small areas of acute infarct in the right cerebral hemisphere and right ICA and right MCA occlusions.  He underwent emergent mechanical thrombectomy of right ICA terminus and right MCA occlusions achieving a TICI 3 revascularization     Stroke:  right MCA and left CR, left MCA/PCA punctate scattered infarcts with right ICA and proximal MCA occlusions s/p IR with TICI 3 revasculariztion. These events occurred in the setting of known atrial fibrillation off anticoagulation due to recent burr hole surgery for SDH.    Code Stroke CT head no acute intracranial hemorrhage  CTA head right ICA remains occluded, High-grade stenosis of M2 right MCA branch vessel.   CTA neck cervical right ICA becomes occluded or near occluded at its proximal to mid aspect. The vessel remains occluded or near occluded throughout the remainder of the neck. Left CCA and ICA patent within the neck with less than 50% stenosis.   CT perfusion no core infarct but large penumbra.  CT head repeat no bleeding  MRI  Multiple small areas of acute infarct in the right cerebral hemisphere. As well as left CR and left MCA/PCA punctate infarcts  MRA Degraded by motion, Right ICA is now patent. Suspect moderate narrowing at the Clinoid  2D Echo: EF 60-65%, Left atrial size was severely dilated. Mild LVH. Left ventricular diastolic  function could not be evaluated due to atrial fibrillation.  LDL 60  HgbA1c 5.4  VTE prophylaxis - heparin IV infusion  aspirin 81 mg daily prior to admission, Now on Argatroban infusion  Recurrent right MCA infarct due to right M1 occlusion s/p IR with TICI3   Acute onset AMS, right gaze and left hemiplegia  CT head no acute change.  CTA head and  neck showed right M1 occlusion and short segment of right P2 high grade stenosis vs. Occlusion.   CTP showed large penumbra.  S/p IR 3/23 with TICI3  MRI Interval development of multiple new acute ischemic infarcts involving the right cerebral hemisphere, primarily involving the right temporal lobe, consistent with recent right M1 occlusion.  MRA Interval  revascularization of previously identified right M1 occlusion, with wide patency of the right MCA now seen.  On Argatroban infusion  Therapy recommendations:  Pending overall stability and progress, very eventful last few days s/p 3 procedures.   Disposition:  TBD  Right axillary A occlusion with suspected cardiac embolus  Right axillary artery occluded with partial thrombus on doppler  S/p LUE thrombectomy 3/24 with Dr. Carlis Abbott   Now RUE warm and pulse intact  Incision bleeding 3/25 - VVS on board   Seizure history  11/2020 due to SDH, discharged with depakote 500 bid  This admission Depakote level 44->41->65->38->54-85->86->71  Loaded with Depakote 1500mg  x3  Continue Depakote 500mg  bid -> Q8h  EEG: mild-moderate diffuse encephalopathy, with possible/not definitive increased areas of focal neuronal dysfunction in the right (artifact is more favored) and possibly in the anterior regions (bilaterally).  There are no clear epileptiform abnormalities.  Bilateral lower extremity DVTs  History of blood clots, prior DVT 2006  This admission LE venous doppler - R posterior tibial and peroneal veins and L peroneal veins DVT.  On Argatroban infusion  Atrial fibrillation  Previously on coumadin -> eliquis -> Xarelto, held due to SDH s/p craniotomy 11/2020  Argatroban gtt initiated 3/27 and heparin discontinued due thrombocytopenia and history of dropping platelets on heparin therapy  Platelet 142->112->100->85->90 (heparin gtt discontinued)   Echo EF 60-65%  Rate controlled  SDH  11/2020 left SDH status post  evacuation  Anticoagulation was on hold, plan to resume in 6 weeks  This admission CT head left SDH much improved  History of stroke  03/2019 admitted for left-sided weakness, left hemianopia, MRI showed only incidental small left cerebellar infarct.  Diagnosed with right brain TIA. CT showed no acute finding, but old left MCA infarct. MCA showed right siphon mild to moderate stenosis.  Carotid Doppler negative.  EF 60 to 65%.  LDL 94, A1c 5.3.  Continued on Coumadin and Lipitor 80   Dysphagia  NPO now  S/p cortrak placement  On TF and IVF.    Urosepsis  Febrile on admission 101.9 -> 101.2  WBC today 6.2  UA: +nitrite, +LE, WBC >50  Urine culture: ecoli  Recent ecoli uti (12/06/20)  Received vancomycin IV x1, was on cefepime -> now off  Hypertension->hypotension  Home meds:  Amlodipine 5mg  daily  BP 120-140s mostly with once up to 161 systolic last 24 hours . On TF and IVF . Goal normotension  Hyperlipidemia  Home meds:  Atorvastatin 80mg  daily, resumed in hospital  LDL 60, goal < 70  Continue statin at discharge  CKD Stage III  Baseline sCr 1.3-1.5.  Cre now 1.68->1.36-.1.48  Other Stroke Risk Factors  Advanced Age >/= 44   Overweight, Body mass index is 26.41 kg/m., recommend weight loss, diet and exercise as appropriate   Hx stroke  Family hx stroke (father stroke)  Coronary artery disease  Abdominal aortic aneurysm  TAVR 10/2020  Other Active Problems  GERD: protonix  BPH: proscar  I have personally obtained history,examined this patient, reviewed notes, independently viewed imaging studies, participated in medical decision making and plan of care.ROS completed by me personally and pertinent positives fully documented  I have made any additions or clarifications directly to the above note. Agree with note above.  Patient remains aphasic and difficult to arouse with generalized weakness and his overall general medical condition remains  quite poor.  Chances of him surviving and making significant improvement to live independently are negligible.  He will likely  need prolonged care and rehabilitation and even later on.  Long discussion with the patient's daughter and wife at the bedside.  They understand his condition and feel he would not want to be kept alive if he cannot be independent.  They are going to meet with palliative care team and likely make him comfort care this afternoon after his son arrives.  I am in agreement with the plan. This patient is critically ill and at significant risk of neurological worsening, death and care requires constant monitoring of vital signs, hemodynamics,respiratory and cardiac monitoring, extensive review of multiple databases, frequent neurological assessment, discussion with family, other specialists and medical decision making of high complexity.I have made any additions or clarifications directly to the above note.This critical care time does not reflect procedure time, or teaching time or supervisory time of PA/NP/Med Resident etc but could involve care discussion time.  I spent 30 minutes of neurocritical care time  in the care of  this patient.      Antony Contras, MD Medical Director [facility] Pager: [phone] 01/24/21 4:17 PM   t

## 2021-01-03 NOTE — Progress Notes (Signed)
NAME:  Cesar [name], MRN:  [id number], DOB:  January 11, 1929, LOS: 7 ADMISSION DATE:  12/20/2020, CONSULTATION DATE:  .01-16-21 REFERRING MD:  Cheral Marker, CHIEF COMPLAINT:  Weakness   History of Present Illness:  87 y.o. M with PMH significant for AAA, stage III CKD, OSA, seizures, OSA, severe AS s/p TAVR 10/22/20, permanent Afib who has recently admitted for TAVR and discharged on [place] and Eliquis, re-admitted 2/15 with large, chronic subdural hematoma after falls and underwent bur hole drainage with drain placement.  His anti-coagulation was held until 2/21  and he was discharged to rehab 2/28 ( [facility])  and then to home on 3/17.     Readmitted on 3/21 with UTI, bilateral DVT, right ICA occlusion with new stroke. So was taken emergently to IR, Underwent thrombectomy of ICA and right MCA and was admitted and transported to ICU intubated , and stable  Pertinent  Medical History   has a past medical history of AAA (abdominal aortic aneurysm) ([place]), CKD (chronic kidney disease), stage III ([place]), CVA (cerebral infarction), Depression, DVT (deep venous thrombosis) ([place]) (2006), H/O blood clots, Hematuria, HOH (hard of hearing), Hypercholesterolemia, OSA (obstructive sleep apnea), Permanent atrial fibrillation ([place]), Prostate enlargement, Pulmonary nodules, S/P TAVR (transcatheter aortic valve replacement) (10/22/2020), Thoracic ascending aortic aneurysm ([place]), and TIA (transient ischemic attack).   Significant Hospital Events: Including procedures, antibiotic start and stop dates in addition to other pertinent events   . Admit to hospitalists, worsening neuro status and found to have occluded R ICA, taken to IR and then ICU, PCCM consult . 3/22 extubated. Cefepime for e coli uti  . 3/23 remains stable following extubation, not on any vasoactive infusions. PCCM to sign off. Found unresponsive and slumped over.  CTA head/neck showed R M1 occlusion and R P2 high grade stenosis vs occlusion, CTP large penumbra taken  back emergently to [facility] w/complete revascularization of occluded R MCA; returns intubated.  R arm also had a clot, underwent thrombectomy . 3/24 extubated in event . 3/25 >> pulmonary toileting issues; repeat head imaging shows evolution of known R showering infarcts after thrombectomy . 3/27 switch to argatroban, send HIT panel.  Made DNR, palliative plan   Interim History / Subjective:   Continues on argatroban, remains somnolent  Objective   Blood pressure (!) 128/55, pulse 86, temperature 99.3 F (37.4 C), temperature source Axillary, resp. rate (!) 25, height 6\' 1"  (1.854 m), weight 90.8 kg, SpO2 99 %.        Intake/Output Summary (Last 24 hours) at 01/16/2021 7628 Last data filed at 2021-01-16 0800 Gross per 24 hour  Intake 2673.54 ml  Output 4375 ml  Net -1701.46 ml   Filed Weights   12/12/2020 2010 12/28/20 0300 12/29/20 0400  Weight: 91 kg 91.1 kg 90.8 kg    Gen:      No acute distress, frail, elderly HEENT:  EOMI, sclera anicteric Neck:     No masses; no thyromegaly Lungs:    Scattered rhonchi CV:         Regular rate and rhythm; no murmurs Abd:      + bowel sounds; soft, non-tender; no palpable masses, no distension Ext:    No edema; adequate peripheral perfusion Skin:      Warm and dry; no rash Neuro: Somnolent, arouses to stimuli  Labs/imaging that I havepersonally reviewed   Creatinine slightly up to 1.48 Platelets stable at 90 No new imaging  Resolved Hospital Problem list   Afib RVR E coli UTI s/p 3  days tx   Assessment & Plan:  Acute R ICA stroke x 2 both removed by thrombectomy, some residual small CVAs on MRI  Recent subdural hematoma status post bur hole Continue supportive care, neuro monitoring Modafinil started 3/27 Continue valproic acid  Acute hypoxic respiratory failure Extubated on 3/24. Still has issues with secretion clearance Follow intermittent chest x-ray  Acute lower extremity DVT, right brachial clot post thrombectomy Continues  on argatroban  Severe AS s/p TAVR Continue telemetry monitor  Thrombocytopenia Monitor CBC HIT antibody is pending.  Last checked on 3/8 when it was negative  AKI Continue cautious diuresis, free water On Proscar for urinary retention  Goals of care Palliative care has been consulted with meeting planned for 11 AM today DNR   Best practice (evaluated daily)  Diet:  TF Pain/Anxiety/Delirium protocol (if indicated): No VAP protocol (if indicated): Not indicated DVT prophylaxis: Systemic AC  GI prophylaxis: PPI Glucose control:  SSI Yes Central venous access:  N/A Arterial line:  out Foley:  out Mobility:  OOB -- pt   PT consulted: Yes Last date of multidisciplinary goals of care discussion [per primary] Code Status:  DNR Disposition: ICU    Critical care time:   Marshell Garfinkel MD [facility] See Amion for pager  If no response to pager , please call [phone] until 7pm After 7:00 pm call Elink  [phone] 01/19/21, 9:04 AM

## 2021-01-03 NOTE — Progress Notes (Signed)
[facility] for heparin >> argatroban Indication: acute bilateral DVT, stroke  Heparin Dosing Weight: 91 kg  Labs: Recent Labs    12/28/20 0400 12/28/20 1428 12/29/20 0631 12/29/20 1414 12/29/20 2147 2021-01-03 0251  HGB 7.9*  --  8.3*  --   --  8.6*  HCT 24.7*  --  26.6*  --   --  26.5*  PLT 100*  --  85*  --   --  90*  APTT  --   --   --  138* 86* 78*  HEPARINUNFRC 0.23* 0.34 0.41  --   --   --   CREATININE 1.68*  --  1.36*  --   --  1.48*    Assessment: 37 yom s/p bur hole drainage of complex L chronic SDH last month presenting with acute CVA s/p IR for cerebral arteriogram, emergent mechanical thrombectomy on 3/21. Patient also found to have acute bilateral DVT. PTA apixababn has been held since 11/21/20 with SDH/crani with plan to hold x 6 weeks.  Neurosurgery cleared to begin heparin per stroke protocol on 3/22. Neurology and CCM in agreement. Patient did experience platelet drop to 30s in rehab on heparin SQ with improvement once d/c'd. HIT antibody resulted as negative.   With patient's previous history of dropping plt on heparin and idiopathic R arm and R ICA re-occlusion, per discussion with CCM, will recheck HIT antibody heparin was transitioned to  Argatroban on 3/27. Plans noted for possible comfort care -aPTT=86 after holding for 1hr and decrease to 0.25mg /kg/hr  3/28 AM update:  APTT therapeutic x 2 Hgb stable Plts stable  Goal of Therapy:  aPTT 50-90 seconds, no bolus per stroke protocol Monitor platelets by anticoagulation protocol: Yes   Plan:  Continue argatroban 0.25 mcg/kg/min Daily aPTT/CBC Monitor for bleeding  [name], PharmD, BCPS Clinical Pharmacist Phone: [phone]

## 2021-01-03 NOTE — Progress Notes (Addendum)
Daily Progress Note   Patient Name: Cesar [name]       Date: 2021-01-13 DOB: 13-Oct-1928  Age: 87 y.o. MRN#: [id number] Attending Physician: Stroke, Md, MD Primary Care Physician: Orpah Melter, MD Admit Date: 12/05/2020  Reason for Consultation/Follow-up: Establishing goals of care  Subjective: Medical records reviewed. Assessed patient at the bedside. Discussed with Tacey Ruiz NP, Burman Nieves RN, patient's spouse Tessie Fass, daughter Kelly Splinter, and son [place]. Pt appears uncomfortable, groaning and coughing frequently.  This PA met along with Tacey Ruiz NP at the bedside for a follow up family meeting. Patient's wife and children state that they have come to a decision after further discussion as a family. They feel that a transition to a comfort focused path is more aligned with the patient's values and goals of care.   We discussed patient's strength and family's observation that he has been occasionally responding to them, although not speaking without being prompted. He has opened his eyes once. They are hopeful for as much interaction as possible as nature takes its course and he passes away peacefully.   A MOST form was completed in light of transition to a comfort focused approach. An extensive conversation was had, covering concepts specific to code status, artifical feeding and hydration, continued IV antibiotics and rehospitalization.     Cardiopulmonary Resuscitation: Do Not Attempt Resuscitation (DNR/No CPR)  Medical Interventions: Comfort Measures: Keep clean, warm, and dry. Use medication by any route, positioning, wound care, and other measures to relieve pain and suffering. Use oxygen, suction and manual treatment of airway obstruction as needed for comfort. Do not transfer to  the hospital unless comfort needs cannot be met in current location.  Antibiotics: Determine use of limitation of antibiotics with infection  IV Fluids: No IV fluids (provide other measures to ensure comfort)  Feeding Tube: No feeding tube    Questions and concerns were addressed. Provided education on hospice, symptom management during end of life care, and prognosis of likely days to a couple of weeks. Family voices understanding and display appropriate grief. Encouraged to continue spending quality time with patient. Encouraged to call with additional questions or concerns.   PMT will continue to support holistically.  Addendum: 1340 Per RN, patient having labored breathing. Discussed with family who state this is the most uncomfortable that patient has  appeared. Family agrees to a Dilaudid drip. Orders have been placed accordingly.   Length of Stay: 7  Current Medications: Scheduled Meds:  . glycopyrrolate  0.4 mg Intravenous Q4H  . mouth rinse  15 mL Mouth Rinse q12n4p  . valproic acid  500 mg Per Tube Q8H    Continuous Infusions:   PRN Meds: acetaminophen **OR** acetaminophen (TYLENOL) oral liquid 160 mg/5 mL **OR** acetaminophen, antiseptic oral rinse, bisacodyl, LORazepam, morphine injection, ondansetron (ZOFRAN) IV, polyvinyl alcohol  Physical Exam Vitals and nursing note reviewed.  Constitutional:      Appearance: He is ill-appearing.  HENT:     Head:     Comments: C[place] secured in place. Cardiovascular:     Rate and Rhythm: Normal rate and regular rhythm.  Pulmonary:     Effort: Pulmonary effort is normal.  Neurological:     Mental Status: He is lethargic.             Vital Signs: BP (!) 145/63   Pulse 87   Temp 99.3 F (37.4 C) (Axillary)   Resp (!) 23   Ht _0  (1.854 m) Comment: per previous ht  Wt 90.8 kg   SpO2 98%   BMI 26.41 kg/m  SpO2: SpO2: 98 % O2 Device: O2 Device: Room Air O2 Flow Rate: O2 Flow Rate (L/min): 4 L/min  Intake/output  summary:   Intake/Output Summary (Last 24 hours) at Jan 04, 2021 1129 Last data filed at 01-04-2021 1100 Gross per 24 hour  Intake 2477 ml  Output 4375 ml  Net -1898 ml   LBM: Last BM Date: 01-04-21 Baseline Weight: Weight: 86.2 kg Most recent weight: Weight: 90.8 kg       Palliative Assessment/Data: 10%      Patient Active Problem List   Diagnosis Date Noted  . Malnutrition of moderate degree 12/27/2020  . Acute hypoxemic respiratory failure ([place])   . UTI (urinary tract infection) 12/16/2020  . Severe sepsis ([place]) 12/16/2020  . Acute metabolic encephalopathy 85/63/1497  . Stroke ([place]) 12/07/2020  . Middle cerebral artery embolism, right 01/02/2021  . Dysphagia   . Hematuria   . Acute lower UTI   . Traumatic subdural hematoma ([place]) 12/02/2020  . Aphasia 11/26/2020  . Right sided weakness 11/26/2020  . Seizures ([place])   . History of CVA (cerebrovascular accident)   . History of DVT (deep vein thrombosis)   . OSA (obstructive sleep apnea)   . Thrombocytopenia ([place])   . Frequent falls 11/20/2020  . Subdural bleeding ([place]) 11/20/2020  . SDH (subdural hematoma) ([place]) 11/19/2020  . Pressure injury of skin 10/23/2020  . 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 Severe aortic stenosis   . Syncope 10/16/2020  . Gait abnormality 04/15/2020  . Confusion 11/16/2019  . Cerebrovascular accident (CVA) ([place]) 11/16/2019  . 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 Acute ischemic stroke ([place]) 03/15/2019  . PAD (peripheral artery disease) ([place]) 07/23/2018  . Ischemia of lower extremity 07/12/2016  . Acute blood loss anemia 07/12/2016  . Depression 07/12/2016  . BPH (benign prostatic hyperplasia) 07/12/2016  . AAA (abdominal aortic aneurysm) ([place])   . Atrial fibrillation ([place])   . Embolism ([place]) 07/02/2016  . CKD (chronic kidney disease), stage III ([place])   . Claudication ([place]) 06/04/2016  .  Long term current use of anticoagulant 06/04/2016  . Abnormal ECG 06/04/2016  . Thoracic ascending aortic aneurysm ([place]) 04/22/2015  . Permanent atrial fibrillation ([place]) 05/11/2013  .  Mild aortic stenosis 05/11/2013  . Orthostatic hypotension 05/11/2013  . Hypercholesterolemia 05/11/2013    Palliative Care Assessment & Plan   Patient Profile: Per intake H&P --> 24 y.o.M with PMH significant for AAA, stage III CKD, OSA, seizures, OSA, severe AS s/p TAVR 10/22/20, permanent Afib who has recently admitted for TAVR and discharged on Asa and Eliquis, re-admitted 2/15 with large, chronic subdural hematoma after falls and underwent bur hole drainage with drain placement. He then presented to the ED on 3/21 with generalized weakness and fever to 101.9 and was found to have UTI and bilateral DVT's MRI showed possible internal carotid artery occlusion on the right and right MCA CVA. CTA showed occluded R ICA, so was taken emergently to IR, Underwent thrombectomy of ICA and right MCA   Palliative care has been asked to get involved to further discuss goals of care in the setting of multiple co-morbidities and a lack of great improvement during hospitalization. He now has ongoing failure to thrive with a coretrack in place.  Assessment: Acute R ICA stroke x 2 both removed by thrombectomy, some residual small CVAs on MRI Recent subdural hematoma status post bur hole Acute hypoxic respiratory failure Acute lower extremity DVT, right brachial clot post thrombectomy Severe AS s/p TAVR AKI on CKD3  Recommendations/Plan: Transition to CMO Dilaudid scheduled for pain/air hunger/comfort Dilaudid PRN for pain/air hunger/comfort Robinul scheduled for excessive secretions Ativan PRN for agitation/anxiety/seizure Zofran PRN for nausea Liquifilm tears PRN for dry eyes Bisacodyl suppository PRN for constipation May have comfort feeding Comfort cart for family Unrestricted visitations in the setting of  EOL (per policy) Oxygen PRN 2L or less for comfort. No escalation.   DNR order signed and placed in hard chart. Will scan copy into EMR  MOST form signed and placed in hard chart. Will scan copy into EMR  Transfer request to 6N ordered   Continue to provide psychosocial and emotional support to patient and family  Goals of Care and Additional Recommendations:  Limitations on Scope of Treatment: Full Comfort Care  Code Status: DNR   Code Status Orders  (From admission, onward)         Start     Ordered   12/29/20 1040  Do not attempt resuscitation (DNR)  Continuous       Question Answer Comment  In the event of cardiac or respiratory ARREST Do not call a "code blue"   In the event of cardiac or respiratory ARREST Do not perform Intubation, CPR, defibrillation or ACLS   In the event of cardiac or respiratory ARREST Use medication by any route, position, wound care, and other measures to relive pain and suffering. May use oxygen, suction and manual treatment of airway obstruction as needed for comfort.      12/29/20 1039        Code Status History    Date Active Date Inactive Code Status Order ID Comments User Context   12/17/2020 2010 12/29/2020 1039 Full Code [id number]  Luanne Bras, MD Inpatient   12/16/2020 1811 12/21/2020 2010 Full Code [id number]  Rikki Spearing, NP ED   12/18/2020 0733 12/09/2020 1811 Full Code [id number]  Shela Leff, MD ED   12/02/2020 1535 12/19/2020 1547 Full Code [id number]  Cathlyn Parsons, PA-C Inpatient   12/02/2020 1535 12/02/2020 1535 Full Code [id number]  Elizabeth Sauer Inpatient   11/19/2020 2320 12/02/2020 1531 Full Code [id number]  Shela Leff, MD ED   10/21/2020 1018 10/23/2020 2321 Full Code [id number]  Rexene Alberts, MD Inpatient   10/21/2020 [phone] 10/21/2020 1018 DNR [id number]  [name], MD Inpatient   10/21/2020 0733 10/21/2020 0922 Full Code [id number]  [name], MD Inpatient   10/16/2020 1531 10/21/2020  0733 DNR [id number]  Lequita Halt, MD ED   10/16/2020 1504 10/16/2020 1531 Full Code [id number]  Lequita Halt, MD ED   08/06/2019 0403 08/15/2019 1743 Full Code [id number]  Iline Oven Inpatient   03/15/2019 2022 03/16/2019 1927 Full Code [id number]  Etta Quill, DO ED   07/02/2016 1149 07/06/2016 1826 Full Code [id number]  Ulyses Amor, PA-C Inpatient   Advance Care Planning Activity      Prognosis:   < 2 weeks given FTT, frailty, multiple strokes, recurrent readmission, and multiple comorbidities   Discharge Planning:  To Be Determined  Thank you for allowing the Palliative Medicine Team to assist in the care of this patient.   Time In: 10:50am Time Out: 11:55am Total Time 65 minutes Prolonged Time Billed  yes     Greater than 50% of this time was spent counseling and coordinating care related to the above assessment and plan.  Joanthan Hlavacek Johnnette Litter, PA-C Palliative Medicine Team Team phone # [phone]  Thank you for allowing the Palliative Medicine Team to assist in the care of this patient. Please utilize secure chat with additional questions, if there is no response within 30 minutes please call the above phone number.  Palliative Medicine Team providers are available by phone from 7am to 7pm daily and can be reached through the team cell phone.  Should this patient require assistance outside of these hours, please call the patient's attending physician.

## 2021-01-03 DEATH — deceased

## 2021-03-14 ENCOUNTER — Ambulatory Visit: Payer: Medicare Other | Admitting: Cardiovascular Disease

## 2022-12-26 IMAGING — CT CT CERVICAL SPINE W/O CM
3 series · 9 of 33 positions shown, 11 images · non-contrast
Comparison: None.

CLINICAL DATA: Multiple fall

EXAM:
CT CERVICAL SPINE WITHOUT CONTRAST
TECHNIQUE: Multidetector CT imaging of the cervical spine was performed without
intravenous contrast. Multiplanar CT image reconstructions were also
generated.

[Series 5: c spine soft · axial · 0.39mm/px · z∈[+1192,+1192]mm · 1 of 119 slices shown, 2 images]
[im 64/119  soft-tissue]
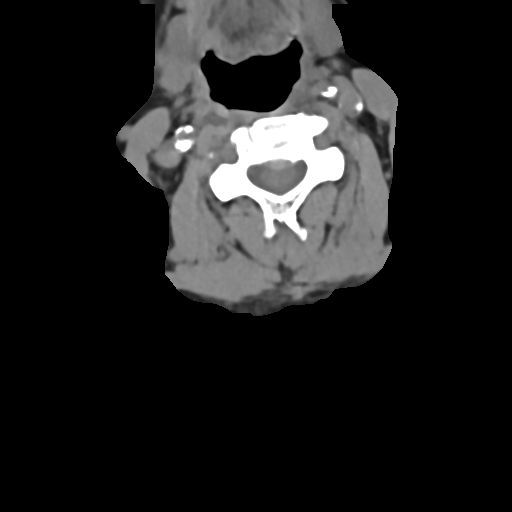
[im 64/119  bone]
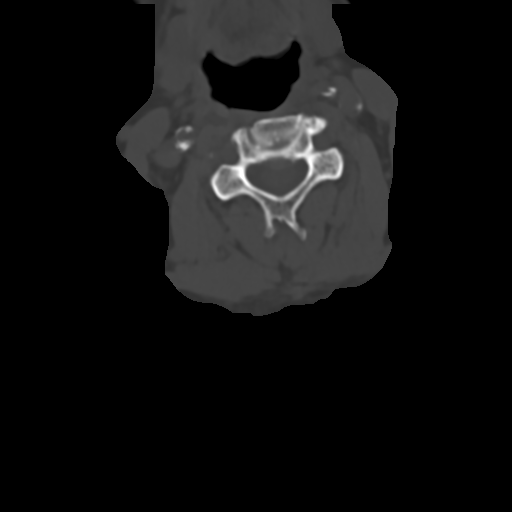

[Series 8: sag bone · sagittal · 0.29mm/px · 5 of 66 slices shown, 6 images]
[im 22/66  bone]
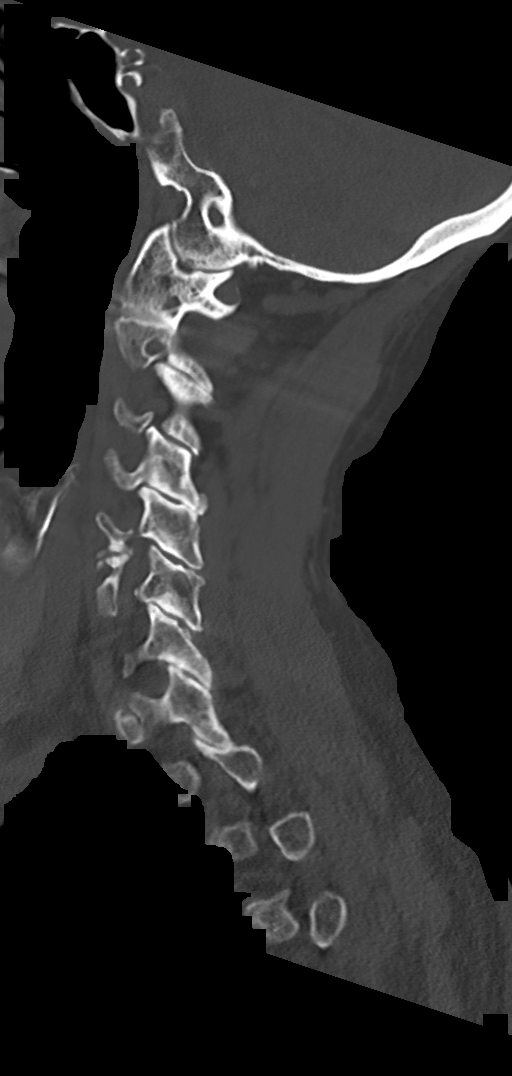
[im 28/66  bone]
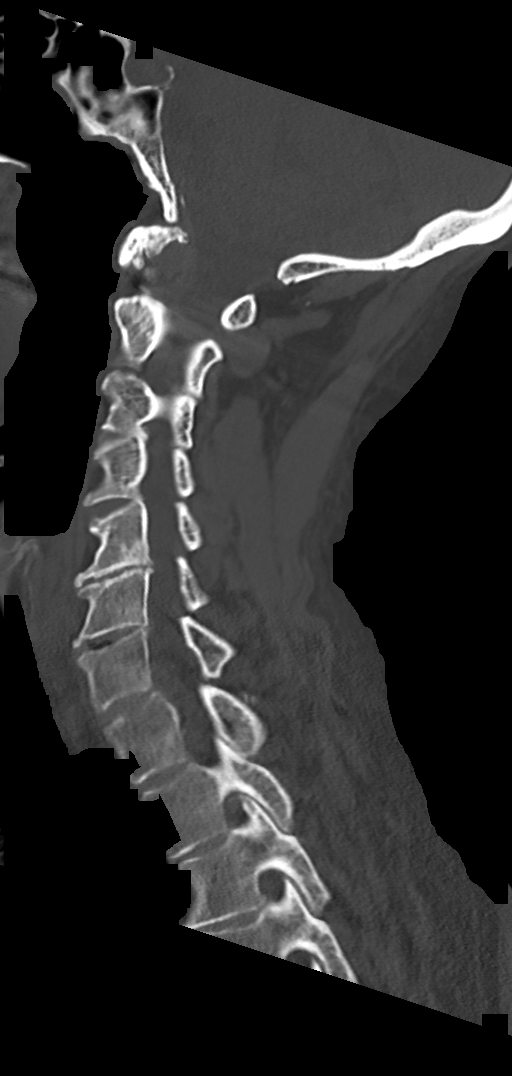
[im 33/66  soft-tissue]
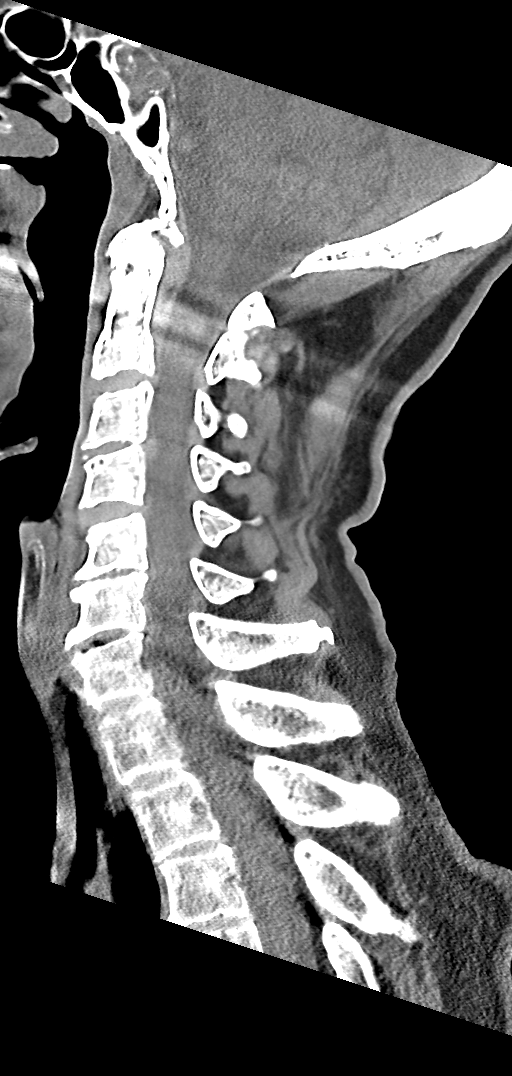
[im 33/66  bone]
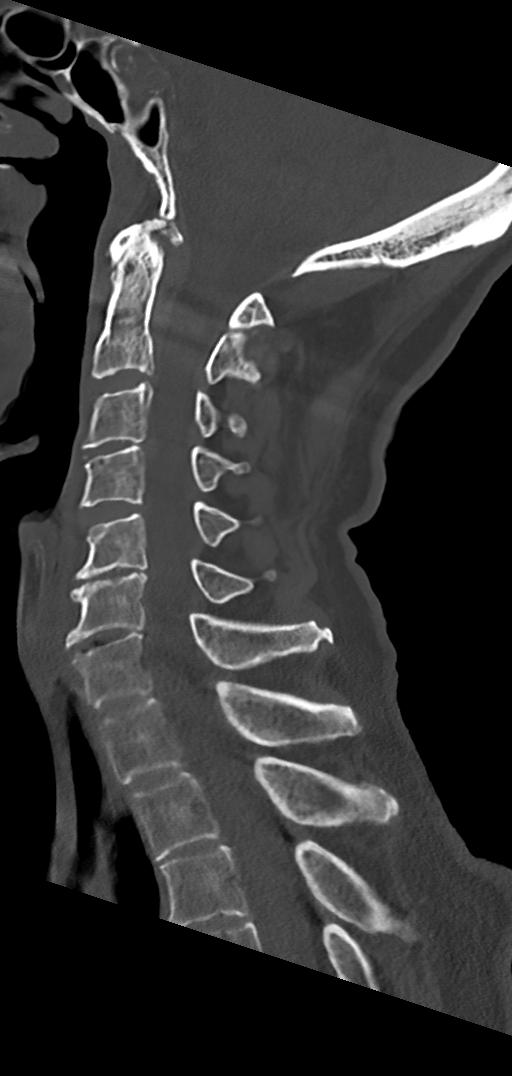
[im 38/66  bone]
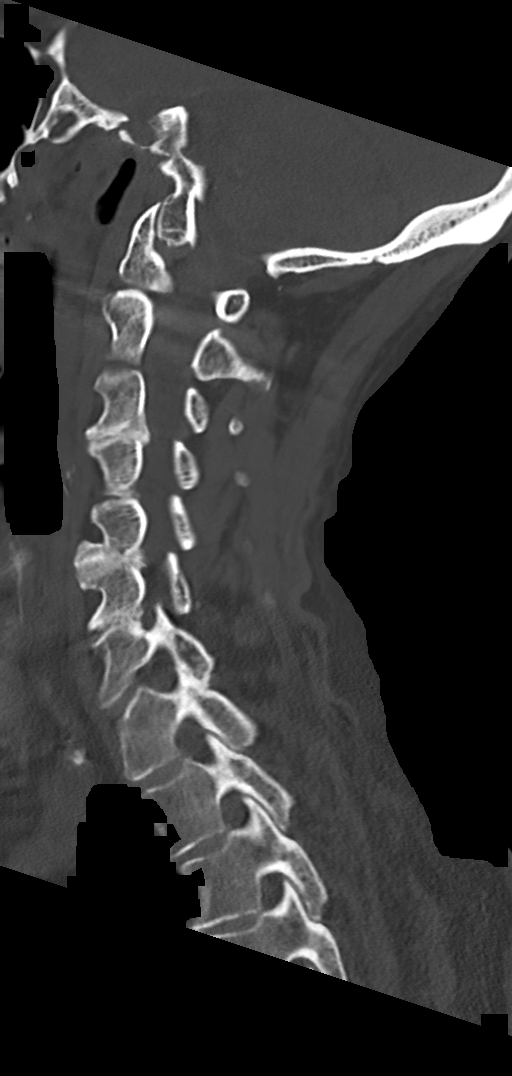
[im 44/66  bone]
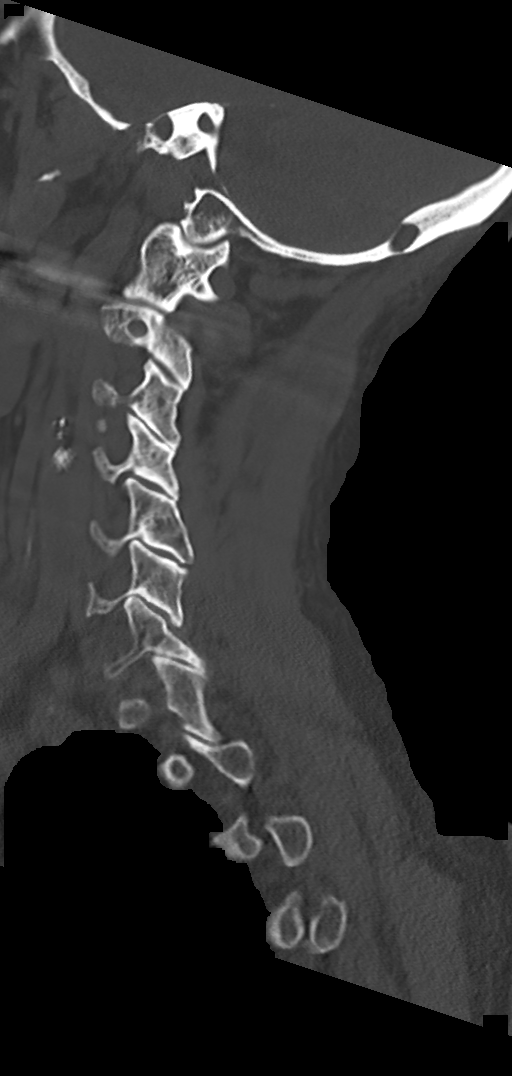

[Series 9: cor bone · coronal · 0.34mm/px · 3 of 70 slices shown]
[im 14/70  bone]
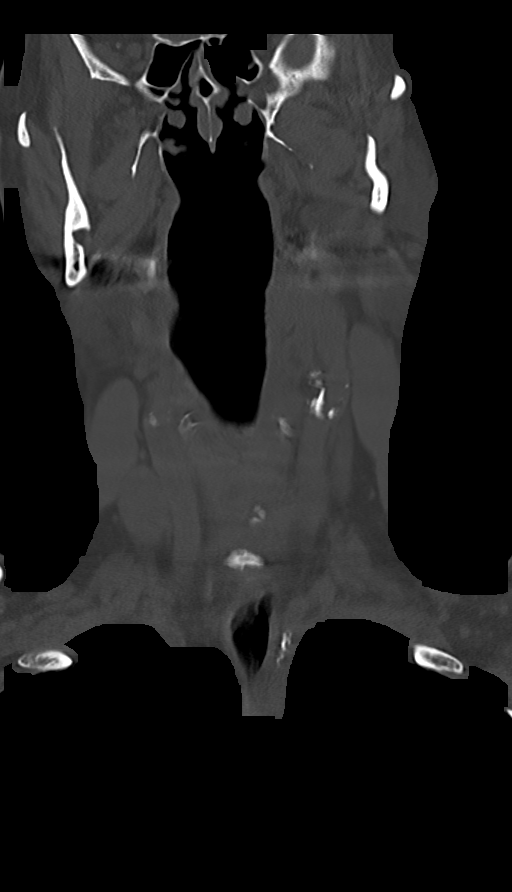
[im 28/70  bone]
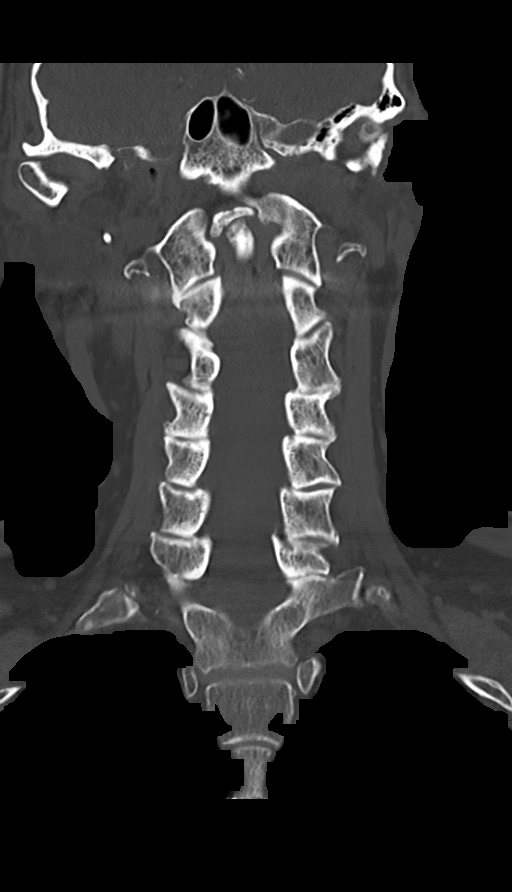
[im 42/70  bone]
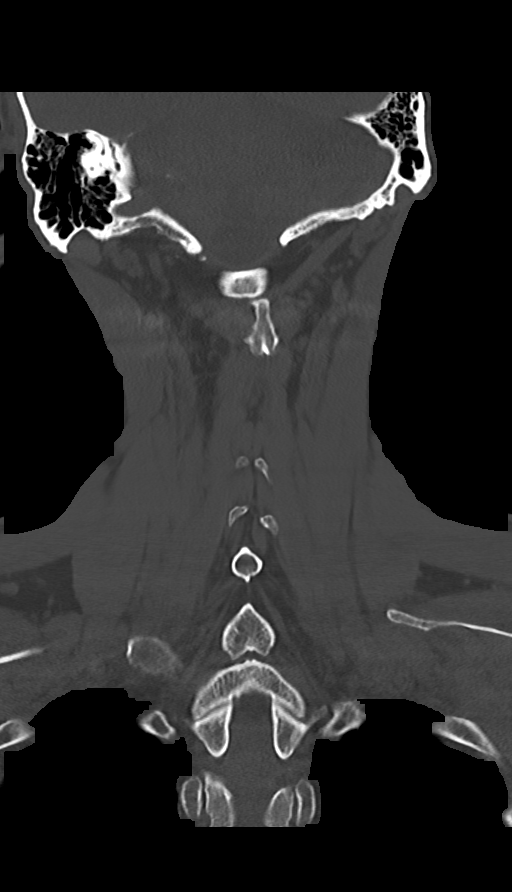

[9 of 33 positions shown; findings below may reference images not displayed]

FINDINGS: Alignment: No subluxation.  Facet alignment is maintained.

Skull base and vertebrae: No acute fracture. No primary bone lesion
or focal pathologic process.

Soft tissues and spinal canal: No prevertebral fluid or swelling. No
visible canal hematoma.

Disc levels: Multiple level degenerative change with moderate disc
space narrowing at C3-C4, C5-C6 and C6-C7. Facet degenerative
changes at multiple levels.

Upper chest: Emphysema at the apices

Other: None
IMPRESSION: 1. Degenerative changes of the cervical spine. No acute osseous
abnormality.
2. Emphysema at the apices.

Emphysema ([id number]).
